# Patient Record
Sex: Male | Born: 1985 | Race: White | Hispanic: No | Marital: Single | Smoking: Current every day smoker
Health system: Southern US, Community
[De-identification: ages and names within clinical notes are randomized; demographics above are authoritative.]

## PROBLEM LIST (undated history)

## (undated) DIAGNOSIS — B1920 Unspecified viral hepatitis C without hepatic coma: Secondary | ICD-10-CM

## (undated) DIAGNOSIS — T1491XA Suicide attempt, initial encounter: Secondary | ICD-10-CM

## (undated) DIAGNOSIS — F329 Major depressive disorder, single episode, unspecified: Secondary | ICD-10-CM

## (undated) DIAGNOSIS — F32A Depression, unspecified: Secondary | ICD-10-CM

## (undated) HISTORY — PX: MANDIBLE FRACTURE SURGERY: SHX706

---
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
## 2007-02-06 NOTE — Unmapped (Signed)
THE [facility]     PATIENT NAME:   Alexander [name], Alexander [name]                    MR #:  [id number]   DATE OF BIRTH:  1986/01/20                        ACCOUNT #:  [id number]   ED PHYSICIAN:   Frederik Schmidt, M.D.       ROOM #:   PRIMARY:        Bartolo Darter, M.D.            NURSING UNIT:  ED   REFERRING:      Selected Referral Pt              FC:  S   DICTATED BY:    Glenetta Borg, M.D.               ADMIT DATE:  02/05/2007   VISIT DATE:     02/05/2007                        DISCHARGE DATE:                           EMERGENCY DEPARTMENT DISCHARGE NOTE     CHIEF COMPLAINT:  Abdominal pain and blood in my stool.     HISTORY OF PRESENT ILLNESS:  This is an 21 year old, Caucasian gentleman who   states that yesterday he started noticing that he was having some abdominal   pain located in his upper abdomen region.  States that today it got acutely   worse.  He was driving back to his house where he lives with his girlfriend   and the pain became so severe that she had to help him out of the car and   actually call a life squad because he felt like the pain was going to cause   him to pass out.  The patient states that this pain seems to come and go.  It   worsens when he moves around or takes deep breaths.  He states that he has   also noted that for the past couple days, he has had some blood in his stool.   He has noticed some red blood in the toilet bowl, as well as blood on his   toilet tissue.  Denies any chest pain or shortness of breath.  He states that   he does have some nausea.  He has not had any vomiting.  He has not had any   lightheadedness or dizziness.  States that he has had a hard time eating over   the past couple of days because of this abdominal pain, although the pain   does not seem to change when he eats.  He denies any other complaints at this   time.     PAST MEDICAL HISTORY:     1. Psychiatric condition.     MEDICATIONS:     1. Xanax.   2. Seroquel.      ALLERGIES:  He states that he has no allergies.     SOCIAL HISTORY:  He smokes a pack and a half per day.  He states that he does   not use any alcohol or illicit drugs.     REVIEW OF SYSTEMS:  Negative other than that stated in the HPI.  PHYSICAL EXAMINATION:     VITAL SIGNS:  BP 144/90, pulse 90, respirations 16, temperature 97.6, satting   98% on room air.   GENERAL:  This is a well-developed, well-appearing, Caucasian gentleman who   is lying down on his left side in bed, curled in the fetal position.  He is   moaning at this time.  He appears in mild distress.   HEENT:  Head is atraumatic, normocephalic.  Pupils are equal, round and   reactive to light.  Extraocular movements are intact.  He has no nasal   drainage.  Mucous membranes are moist.   NECK:  Without any lymphadenopathy.  He has full range of motion.   CARDIOVASCULAR:  Regular rate and rhythm, no murmurs, rubs or gallops.   RESPIRATORY:  Clear to auscultation bilaterally.   ABDOMEN:  Soft, nondistended.  He does have tenderness in his left upper   quadrant.  He has no rebound, no guarding.  The patient does have bowel   sounds noted in all four quadrants that did not appear to be abnormal.  I am   able to press my stethoscope quite hard into his belly without eliciting any   pain.   SKIN:  Warm and well-perfused.   MUSCULOSKELETAL:  He has full range of motion about all joints.   NEUROLOGIC:  He is alert and oriented to person, place, time and situation.   He is answering questions appropriately.   RECTAL:  The patient has normal rectal tone.  He has no gross blood.  He has   no anal fissures or ulcerations noted.  He has no external hemorrhoids noted.   No internal hemorrhoids are palpable.     EMERGENCY DEPARTMENT COURSE:  This patient was seen and evaluated by myself   and the attending physician.  He did have an IV started and had multiple labs   drawn and sent.  A guaiac was performed on his stool, which was negative.  He   did originally  have a liter of normal saline, as well as some morphine for   his pain.  He states that he was continuing to have excessive pain.  At that   time, he was given Percocet and it was determined that he could be discharged   home in good condition.     LABORATORY DATA:  The patient had a CBC with a white count of 6.5, hemoglobin   15, hematocrit 43.1, platelets of 285.  He had a renal with a sodium 139,   potassium 4.3, chloride 103, CO2 26, BUN 13, creatinine 1.05 with a glucose   of 99.  He had an AST of 20, ALT 15, direct bilirubin 0.1, total bilirubin   0.2.  Lipase of 16, alkaline phosphatase of 99.  He had a urine that showed   less than 1 WBC, less than 1 RBC.  Stool was guaiac negative.     MEDICAL DECISION MAKING:  It is unclear what is causing this patient's   abdominal pain at this time.  I do not believe he has an acute abdomen, as he   has no significant tenderness when I press on it with the stethoscope while I   am listening.  He has no nausea or vomiting at this time to suggest he has   obstruction.  Also, his abdomen film did not show any signs of obstruction or   any signs of free air underneath his diaphragm.  I do not feel like  this   patient is having an acute UTI, as he has no evidence of such on his urine.   I do not feel like this patient is having an acute episode of pancreatitis or   any signs of hepatitis, as none of his labs show evidence of damage to his   pancreas or liver.  It is possible that this patient may have an ulcer in his   abdomen causing his pain.  I do not feel like he is actively hemorrhaging at   this time.  His stool was guaiac negative and he has no active bleeding from   his rectum at this time.  He has not had any vomiting with blood in it to   suggest ulceration at this time.  I do believe the patient has a nonacute   abdomen and is stable for discharge.  His vital signs all did remain stable   throughout his course here.     DIAGNOSIS:     1. Abdominal pain, NOS.      DISPOSITION:  Home.     CONDITION:  Good.     PLAN:     1. The patient is instructed take Percocet as needed for pain.   2. 2.  He is instructed to establish a primary care physician.   3. He is instructed to return for worsened pain, fevers, vomiting or any     other concerns.     ADDITIONAL DATA:  The patient was in the process of being discharged.  When I   did go in to talk to the patient, he got very distraught and states that I   was calling him a liar.  He states that he does not understand why I did so   many labs and tests if I did not think he was having this pain for real   anyway.  The patient became quite agitated and was demanding to leave.  When   I informed him that he needed to wait just a moment for his nurse, he started   banging his self against the wall and was becoming very loud and agitated.   At this time, the nurse did presents to the room to help calm the patient   down.  The patient again stated that he felt like I had called him a liar and   he was very unhappy with the care he received.  He was very distraught over   the fact that he had a rectal exam and was very hurt that we did not believe   him.  It was again explained to the patient that we had run multiple labs and   tests to search for a cause for his abdominal pain as we did think that this   was real and we did not find anything acutely to keep him here.  The patient   then did calm down in the presence of his father and the nurse and left   without any further difficulty.                                                       _______________________________________   CE/nc  _____   D:  02/05/2007 23:47                  Glenetta Borg, M.D.   T:  02/06/2007 14:40   Job #:  [id number]                                         _______________________________________                                         _____                                         Frederik Schmidt, M.D.                              EMERGENCY DEPARTMENT DISCHARGE NOTE                                        COPY                    PAGE    1 of   1                                         Frederik Schmidt, M.D.                             EMERGENCY DEPARTMENT DISCHARGE NOTE                                        COPY                    PAGE    1 of   1

## 2007-03-25 ENCOUNTER — Inpatient Hospital Stay

## 2007-03-25 NOTE — Unmapped (Signed)
THE [facility]     PATIENT NAME:   Alexander [name], Alexander [name]                    MR #:  [id number]   DATE OF BIRTH:  [birth year]                        ACCOUNT #:  [id number]   ED PHYSICIAN:   Tawni Pummel. Sena Slate, M.D.         ROOM #:   PRIMARY:        Referring Nonstaff                NURSING UNIT:  ED   REFERRING:      Selected Referral Pt              FC:  S   DICTATED BY:    Tawni Pummel. Sena Slate, M.D.         ADMIT DATE:  03/25/2007   VISIT DATE:                                       DISCHARGE DATE:                       EMERGENCY DEPARTMENT 24 HOUR FOLLOWUP NOTE     **ADDENDUM: 03/24/07 - MR     [name] is a gentleman seen by Dr. Roney Mans yesterday, and I staffed the case.   Essentially he presented with several small fragments of glass in his right   eye which we had some difficulty removing with a TB syringe.  We used a   Morgan lens to try and wash them out, and the majority of the fragments were   able to be removed, but there was at least one small fragment that we simply   were unable to remove and we set up follow-up at 10:00 this [facility].  The patient states that he was there and waited in no   one came.  I called the ophthalmologist, and the ophthalmologist says he was   there and waited and no one came so ultimately they must have been at   differing locations.     Medical screening examination was performed.     VITAL SIGNS:  Blood pressure 120/77, pulse 63, respiratory rate 16,   temperature 97.0, pulse ox 100% room air.   GENERAL:  The patient was smiling, in no distress, although somewhat   frustrated with the situation.  He was ambulatory without any difficulty.   HEENT:  He still had a red eye.  I did not do a detailed eye exam as the   patient was able to be dispositioned directly to ophthalmology's care.     EMERGENCY DEPARTMENT COURSE:  The patient stated he had no other complaints   today.  I called and discussed the case with the ophthalmologist  who stated   that he was still in clinic and that we should send the patient right over   and he would be happy to see him and take care of him there and the patient   was walked over to [facility] to make sure that he met up with the   ophthalmologist.     IMPRESSION:     1. Foreign body sensation, right eye and eye pain  in the right eye, not     otherwise specified.     PLAN:     1. Go straight to [facility] and follow the directions of the     ophthalmologist.                                                     _______________________________________   [facility]/mr                                _____   D:  03/25/2007 13:20                  Tawni Pummel. Sena Slate, M.D.   T:  03/25/2007 19:27   Job #:  [id number]                           EMERGENCY DEPARTMENT 24 HOUR FOLLOWUP NOTE                                        COPY                    PAGE    1 of   1   D:  03/25/2007 13:20                  Tawni Pummel. Sena Slate, M.D.   T:  03/25/2007 19:27   Job #:  [id number]                           EMERGENCY DEPARTMENT 24 HOUR FOLLOWUP NOTE                                        COPY                    PAGE    1 of   1

## 2007-03-26 NOTE — Unmapped (Signed)
THE [facility]     PATIENT NAME:   Alexander [name], Alexander [name]                    MR #:  [id number]   DATE OF BIRTH:  [birth year]                        ACCOUNT #:  [id number]   ED PHYSICIAN:   Tawni Pummel. Sena Slate, M.D.         ROOM #:   PRIMARY:        Referring Nonstaff                NURSING UNIT:  ED   REFERRING:      Selected Referral Pt              FC:  S   DICTATED BY:    Elwyn Reach, M.D.                 ADMIT DATE:  03/24/2007   VISIT DATE:     03/24/2007                        DISCHARGE DATE:                           EMERGENCY DEPARTMENT DISCHARGE NOTE     CHIEF COMPLAINT:  Right eye pain.     HISTORY OF PRESENT ILLNESS:  The patient is an 21 year old gentleman who   states that when he woke up earlier in the morning he had some right eye   pain.  He also states that he had some itchiness and some tearing of the eye.   The patient states that his pain is 8/10, located in his right eye, provoked   by sunlight, relieved by running water through it, radiates to his head, it   is a sharp pressure-like sensation that is constant.  The patient denies any   injury to it and does not recall any true insertion of a foreign body but   does state that his brother had broken a picture frame on the couch, and once   the glass was cleaned up the patient did fall asleep on the couch and   believes that there is a possibility that there might have been a piece of   glass that may have gone into his eye.  He does were contact lenses, but he   is currently taking none at this time.     PAST MEDICAL HISTORY:     1. Bipolar disorder.     MEDICATIONS:     1. Xanax.   2. Seroquel.     ALLERGIES:  No known drug allergies.     SOCIAL HISTORY:  The patient smokes a pack a day.  Denies any alcohol or drug   use.     REVIEW OF SYSTEMS:  All systems were reviewed and were negative except for   mentioned above in history of present illness.     PHYSICAL EXAMINATION:     VITAL SIGNS:  Blood pressure is 118/62,  pulse 63, respirations 18,   temperature 97.2 and O2 saturation 98% on room air.   GENERAL:  The patient is alert and oriented x 3, in no apparent distress.   HEENT:  Head is normocephalic and atraumatic.  Eyes:  Pupils are equal and   reactive to light  bilaterally with extraocular movements intact.  The   patient's right eye had scleral injections noted throughout with fluoroscein   exam there is mild amount of uptake at approximately 7 o'clock just overlying   his iris.  The patient had a slit lamp examination an area that seemed to   have reflected light that seemed to resemble glass that measured   approximately 0.5 mm x 0.5 mm and seemed to be in bed with the corneas.  It   is to be staying in place with blinking of the eye.  There did not seemed to   be any associated abrasions of the eye itself.   NECK:  Supple.  Full range of motion.  No JVD and no cervical   lymphadenopathy.   RESPIRATORY:  Clear to auscultation bilaterally.  No wheezes, rales or   rhonchi.   CARDIOVASCULAR:  Regular rate and rhythm.  No murmurs, rubs or gallops.   Normal S1, S2.   ABDOMEN:  Soft, nontender, nondistended, positive bowel sounds.  No signs of   hepatosplenomegaly.   SKIN:  No lesions were noted throughout.   PSYCHIATRIC:  Normal mood and appropriate affect.     EMERGENCY DEPARTMENT COURSE:  The patient was triaged to minor care, assessed   by myself and the attending physician.  Labs and imaging studies were   performed.  We did try to attempt to remove the foreign body using a small   needle syringe and was able to do so and in addition to that patient had a   Morgan lens placed in his right eye and 1 L normal saline was flushed through   that.  On repeat examination with the slit lamp, the patient seemed to have   had his foreign body removed, but there was an area of corneal abrasion that   seemed to measure approximately 1-2 mm x 2 mm and again located approximately   7 o'clock.     MEDICAL DECISION MAKING:  The patient  is an 21 year old gentleman with a   foreign body that was in his right eye, it seemed to be small shard of glass.   We believe that has been removed after irrigation with normal saline via the   [facility] lens to his right eye.  The patient does report a mild amount of   increased pain but again does not appear that the glass itself is still in   bed with his cornea.  We did speak with the ophthalmology resident on call   and we did not feel the patient required urgent consult, but the patient was   given a referral to be seen in the outpatient clinic at [facility] tomorrow on   Sunday at 10 a.m.  The resident on-call states that he will be able to meet   with the patient tomorrow in their clinic despite being Sunday in order to   ensure that the foreign body itself was removed.     DIAGNOSIS:     1. Right eye corneal abrasion with foreign body that has been removed.     CONDITION:  Good.     DISPOSITION:  Home.     PLAN:     1. The patient was given Polysporin ophthalmic appointment to be placed in     his eye approximately 0.5-inch, ________ was placed on his right eye prior     to discharge and he was instructed to use it 4 times a day.   2. He  is to follow at [facility] in the ground floor     tomorrow at 10 a.m. for further evaluation.  Otherwise, he is asked to     return to the Emergency Department he develops any worsening signs or     symptoms.                                                         ______________________________________   AY/tm                                 ______   D:  03/24/2007 21:42                  Elwyn Reach, M.D.   T:  03/26/2007 00:24   Job #:  [id number]                                         ______________________________________                                         ______                                         Tawni Pummel. Sena Slate, M.D.                             EMERGENCY DEPARTMENT DISCHARGE NOTE                                        COPY                     PAGE    1 of   1

## 2007-03-28 ENCOUNTER — Inpatient Hospital Stay

## 2008-07-03 ENCOUNTER — Inpatient Hospital Stay

## 2008-07-03 NOTE — Unmapped (Signed)
Signed by   LinkLogic on 07/03/2008 at 16:43:36  Patient: Alexander [name]  Note: All result statuses are Final unless otherwise noted.    Tests: (1)  (MR)    Order Note:                                     THE [facility]     PATIENT NAME:   ESCHER, HARR                    MR #:  [id number]  DATE OF BIRTH:  [birth year]                        ACCOUNT #:  [id number]  ED PHYSICIAN:   Leighton Ruff, M.D.               ROOM #:  PRIMARY:        No Pcp No Pcp                     NURSING UNIT:  ED  REFERRING:      No Pcp No Pcp                     FC:  S  DICTATED BY:    Cyndra Numbers, M.D.                 ADMIT DATE:  07/03/2008  VISIT DATE:     07/03/2008                        DISCHARGE DATE:                           EMERGENCY DEPARTMENT ADMISSION NOTE        CHIEF COMPLAINT:  Right eye pain.     HISTORY OF PRESENT ILLNESS:  The patient is a 22 year old male who was  sharpening a blade on a lawnmower today wearing his glasses, when he all of  sudden had pain in his right eye.  He said he flushed it out with water and  no longer feels like there is anything in his eye since doing this.  However,  he has noticed there has been some injection in the sclera surrounding this  and also some swelling around his eye.  The patient stated he noticed that he  has a small black speck in his eye when he looks closely in the mirror at the  iris at about 7 o'clock.  The patient had to have glass removed from this eye  previously in a prior accident.  His past medical history is significant only  for bipolar disorder.  He denies any loss of consciousness with the accident.  He has good maintenance of pupillary shape and no evidence of ruptured globe.     ALLERGIES:  None.     MEDICATIONS:  None.     PAST MEDICAL HISTORY:     1.  Bipolar disorder.     FAMILY HISTORY:  Noncontributory.     SOCIAL HISTORY:  The patient does not have a primary care doctor.     REVIEW OF SYSTEMS:  Negative except as mentioned previously in HPI.     PHYSICAL  EXAMINATION:     VITAL SIGNS:  BP 144/89, pulse 84, respirations  20, temperature 97.6 orally,  O2 sat 100% on room air.  GENERAL:  The patient is in no acute distress.  HEENT:  Eyes:  The patient has scleral injection of the right eye.  There is  some minimal swelling surrounding the eye.  There is no afferent pupillary  defect on swinging light exam.  The patient denies pain on illumination of  the left eye.  The right eye does demonstrate a small fleck of metal with  associated rust ring near the edge of the right iris at the position of 7  o'clock.  The slit lamp malfunctioned during slit lamp exam.  We were able to  have fluorosceined the patient and were not able to see any corneal abrasions  on this evaluation prior to the bulb dying.  The patient on funduscopic exam  had no obvious abnormalities.  Head is otherwise atraumatic, normocephalic.  No gross otorrhea or rhinorrhea.  CARDIOVASCULAR:  Regular rate and rhythm.  No murmurs, rubs or gallops.  No  peripheral edema.  RESPIRATORY:  Clear to auscultation bilaterally.  No crackles, wheezes or  rales.  GI/ABDOMEN:  Normal bowel sounds.  Belly is soft, nontender.  No guarding or  rigidity suggestive of a surgical abdomen.  MUSCULOSKELETAL:  The patient moves all four extremities symmetrically.  No  evidence of paralysis.  SKIN:  No rash, petechia, purpura or jaundice.  NEUROLOGIC:  Cranial nerves II-XII grossly intact.   No gross abnormalities  to strength or sensation.  Gait is within normal limits.  PSYCHIATRIC:  Alert and oriented x 4.     LABORATORY RESULTS/RADIOLOGY:  None.     EMERGENCY DEPARTMENT COURSE/MEDICAL DECISION-MAKING:  The patient was  admitted and examined by myself, as well as the attending, Dr. Crist Fat.  Based  on interview and exam, we attempted eye exam after giving patient tetracaine  drops with ________ eye stain.  Unfortunately, our slit lamp malfunctioned  just as we were attempting to remove the foreign body from the patient's  eye.  Given that we did not have this available to [place], we contacted [facility], gave them the patient's insurance status and need to be seen  today to have this dealt with.  The patient was able to travel quickly over  to [facility] and was therefore sent directly to clinic.  He will be seen there for  foreign body removal and further follow-up.  He did receive 800 mg of Motrin  po here in the emergency department given that he was complaining of headache  from this.     ASSESSMENT:  The patient is a 22 year old gentleman with foreign body in  right eye.     PLAN:     1.  Discharge, personal vehicle for immediate transfer to [facility] to be seen in  clinic.                                                    _______________________________________  MH/lzm                                 _____  D:  07/03/2008 15:14                  Cyndra Numbers, M.D.  T:  07/03/2008 16:38  Job #:  [id number]                                        _______________________________________                                         _____                                        Leighton Ruff, M.D.                              EMERGENCY DEPARTMENT ADMISSION NOTE                                                               PAGE    1 of   1    Note: An exclamation mark (!) indicates a result that was not dispersed into   the flowsheet.  Document Creation Date: 07/03/2008 4:43 PM  _______________________________________________________________________    (1) Order result status: Final  Collection or observation date-time: 07/03/2008 00:00  Requested date-time:   Receipt date-time:   Reported date-time:   Referring Physician: No Pcp  Ordering Physician:  Reviewed In Hospital [facility])  Specimen Source:   Source: DBS  Filler Order Number: [id number]  Lab site:

## 2008-07-03 NOTE — Unmapped (Signed)
Signed by Stoney Bang MD PhD on 07/03/2008 at 16:23:06    Demographic Update    Current registration information has been reviewed and is correct.  No changes are needed.        Reason for Visit   Chief Complaint: [facility] F[facility]  SHAPPENING BLADE ON A LAWN MOWER TODAY AND GOT SOMETHING IN MY RIGHT  C/O NAUSEA DUE PAIN OD 10/10 GIVEN IBUPROFEN BY ER  HX GLASS IN OD 1.29YR AGO  NO SX OU  History from: patient    Allergies  No Known Allergies    Medications     Pain:     Have you had pain other than everyday aches and pains (e.g., mild headache, back ache, strains) in the past week?   Yes  Location of pain: OD  Pain Score: 10/10  If so, did you take medication for this pain?   Yes  What medication?   IBUPROFEN  Did this pain interfere with your daily activities?   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 Went to [facility] - SL broken and sent here. + blurry, photophobia.    Review of Systems   Refer to HPI for review of systems documentation.    PHYSICAL EXAMINATION     Visual Exam   VA:  Right: 20/25  Left: 20/20     CC Pupil: 5-2 ou no rapd  TA Right: 14  TA Left: 14     Time: 4:18 PM  VF: CF Full OU  MOT: Full OU  MB: Orthophoric in primary      SLE (Slit Lamp Exam)   L/L: WNL OU  C/S: tr inj OD  w/q OS  K: tiny metal FB in ant stroma IN of visual axis s significant rust ring OD  c/c OS  AC: d/q OU  I: r/r OU  L: WNL OU  AV: quiet OU      Psychiatric: Alert and oriented x3.      Assessment and Plan  Status of Existing Problems:  Assessed FOREIGN BODY IN CORNEA as new - OD - removed at SL s significant rust ring. PSO ung three times a day x 1 week OD, f/u w/ [facility] 1 week. 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    Today's Orders   Complex Data Collection   (2 points) [IMS-11111]  Self Care Instructions/Simple  (1 point) [DUK-02542]  70623 - Ofc Visit New Level 4 [[id number]]  Ophth Other Preceptor  [phone]    Disposition:   Return to  clinic for Doctor Visit in 1 week(s)   Appointment Reason: w/ [facility] for DFE and MRx, K FB follow-up    *Patient Care Summary printed and given to patient.              Past Medical History:  No significant past medical history.           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

## 2008-07-03 NOTE — Unmapped (Signed)
Signed by   LinkLogic on 07/03/2008 at 14:40:34  Patient: Alexander [name]  Note: All result statuses are Final unless otherwise noted.    Tests: (1)  (MR)    Order Note:                                     THE [facility]     PATIENT NAME:   Alexander [name], Alexander [name]                    MR #:  [id number]  DATE OF BIRTH:  [birth year]                        ACCOUNT #:  [id number]  ED PHYSICIAN:   Leighton Ruff, M.D.               ROOM #:  PRIMARY:        No Pcp No Pcp                     NURSING UNIT:  ED  REFERRING:      No Pcp No Pcp                     FC:  S  DICTATED BY:    Leighton Ruff, M.D.               ADMIT DATE:  07/03/2008  VISIT DATE:     07/03/2008                        DISCHARGE DATE:                           EMERGENCY DEPARTMENT DISCHARGE NOTE        THIS IS AN ADDENDUM.   07/03/2008   CSD     I have seen and evaluated the patient and discussed her care with the  resident physician.  I agree with the assessment and plan.                                                       _______________________________________  MN/csd                                 _____  D:  07/03/2008 13:59                  Leighton Ruff, M.D.  T:  07/03/2008 14:24  Job #:  [id number]                                 EMERGENCY DEPARTMENT DISCHARGE NOTE                                                               PAGE    1 of  1    Note: An exclamation mark (!) indicates a result that was not dispersed into   the flowsheet.  Document Creation Date: 07/03/2008 2:40 PM  _______________________________________________________________________    (1) Order result status: Final  Collection or observation date-time: 07/03/2008 00:00  Requested date-time:   Receipt date-time:   Reported date-time:   Referring Physician: No Pcp  Ordering Physician:  Reviewed In Hospital [facility])  Specimen Source:   Source: DBS  Filler Order Number: [id number]  Lab site:

## 2008-07-10 ENCOUNTER — Inpatient Hospital Stay

## 2009-01-17 NOTE — Unmapped (Signed)
Signed by   LinkLogic on 01/18/2009 at 01:56:24  Patient: Alexander [name]  Note: All result statuses are Final unless otherwise noted.    Tests: (1)  (MR)    Order Note:                                   THE [facility]     PATIENT NAME:   [name], [name]                    MR #:  [id number]  DATE OF BIRTH:  Dec 18, 1985                        ACCOUNT #:  [id number]  ED PHYSICIAN:   Sanjuana Mae, M.D.             ROOM #:  PRIMARY:        No Pcp No Pcp                     NURSING UNIT:  ED  REFERRING:      Selected Referral Pt              FC:  S  DICTATED BY:    Sanjuana Mae, M.D.             ADMIT DATE:  01/17/2009  VISIT DATE:     01/17/2009                        DISCHARGE DATE:                           EMERGENCY DEPARTMENT DISCHARGE NOTE        This is an addendum 01/17/09 -smk     This is an addendum to the resident's dictation.  This patient was seen by  the resident.  I have seen and examined the patient; agree with the workup,  evaluation, management and diagnosis.  Care plan has been discussed.     RIGHT UPPER QUADRANT ULTRASOUND:  Indication was epigastric pain, abdominal  pain.  A limited, bedside ultrasound of the right upper quadrant was  performed.  The medical necessity was to evaluate for gallstones or  sonographic signs of cholecystitis.  The structures studied were the  gallbladder and liver.     I was present during the exam.     INTERPRETATION:  The patient has a normal gallbladder wall, normal  gallbladder size.  No wall thickening was noted.  There was no  pericholecystic fluid.  No stones were seen.  Negative sonographic Murphy's  sign.  Common bile duct was visualized and felt to be normal.                                                    _______________________________________  MW/smk                                 _____  D:  01/17/2009 20:47                  Sanjuana Mae, M.D.  T:  01/18/2009  01:41  Job #:  [id number]                                 EMERGENCY DEPARTMENT  DISCHARGE NOTE                                                               PAGE    1 of   1    Note: An exclamation mark (!) indicates a result that was not dispersed into   the flowsheet.  Document Creation Date: 01/18/2009 1:56 AM  _______________________________________________________________________    (1) Order result status: Final  Collection or observation date-time: 01/17/2009 00:00  Requested date-time:   Receipt date-time:   Reported date-time:   Referring Physician: Selected Pt  Ordering Physician:  Reviewed In Hospital [facility])  Specimen Source:   Source: DBS  Filler Order Number: [id number]  Lab site:

## 2009-01-17 NOTE — Unmapped (Signed)
Signed by   LinkLogic on 01/18/2009 at 18:44:21  Patient: Alexander [name]  Note: All result statuses are Final unless otherwise noted.    Tests: (1)  (MR)    Order Note:                                   THE [facility]     PATIENT NAME:   TIEGAN, TERPSTRA                    MR #:  [id number]  DATE OF BIRTH:  [birth year]                        ACCOUNT #:  [id number]  ED PHYSICIAN:   Sanjuana Mae, M.D.             ROOM #:  PRIMARY:        No Pcp No Pcp                     NURSING UNIT:  ED  REFERRING:      Selected Referral Pt              FC:  S  DICTATED BY:    Lindell Spar, M.D.             ADMIT DATE:  01/17/2009  VISIT DATE:     01/17/2009                        DISCHARGE DATE:                           EMERGENCY DEPARTMENT DISCHARGE NOTE        CHIEF COMPLAINT:  Abdominal pain.     HISTORY OF PRESENT ILLNESS:  This is a 23 year old who complains of  intermittent pain in his right side on and off for a year.  He says today the  pain became more severe.  He describes it as sharp, it comes and goes.  The  pain became severe last night.  He says that occasionally it radiates to his  lower back.  It is worse with walking.  He also notes some pressure when he  urinates.  Complains of legs feeling light and funny when he walks around.  He also notes a rash on his abdomen that has been present for about a week.  He first noticed some spots that looked like bug bites and then had some  surrounding redness.  Not having itching or pain associated with this.  None  of the rashes are draining any fluids.     REVIEW OF SYSTEMS:  He denies nausea, vomiting, diarrhea, fever, bloody  urine, although occasionally urine brown color.  Denies any weakness or  numbness of arms or legs.  All other systems negative as reviewed.     PRIMARY CARE PHYSICIAN:  The patient does not have a primary physician.     PAST MEDICAL HISTORY:     1.  Bipolar disorder.  2.  Anxiety.     ALLERGIES:  No allergies.     MEDICATIONS:  Does not  take any regular medications.     SOCIAL HISTORY:  He smokes two packs a day, occasionally drinks alcohol.  He  used some friend's Vicodin for the pain.  Also tried  Rolaids with no relief.     PHYSICAL EXAMINATION:     VITAL SIGNS:  Blood pressure 166/82, pulse 97, respirations 16, temperature  98.3, sat 100%.  GENERAL:  In general, he is a 23 year old in no acute distress.  HEENT:  Eyes:  Pupils equal, round and reactive to light.  Extraocular  movements intact.  NECK:  Supple, full range of motion.  LUNGS:  Respirations were regular.  Lungs clear to auscultation.  HEART:  Regular rate and rhythm without murmur.  LUNGS:  Clear to auscultation bilaterally.  ABDOMEN:  Soft, nontender with no rebound.  Normoactive bowel sounds.  He has  mild soreness with palpation of his right flank.  No CVA tenderness.  EXTREMITIES:  Has full range of motion of his arms and legs without pain.  NEUROLOGIC:  He was alert and oriented x 3.  Cranial nerves II through XII  intact.  Normal gait.  Intact strength and sensation in his legs.  SKIN:  He was noted to have three 1 cm erythematous papules on his left lower  abdomen and one in his right lower abdomen close to his groin with some  passage of small erythematous papules nontender, not itchy.  On palpation no  discharge from the lesions.  EMERGENCY DEPARTMENT COURSE:  Patient was seen  in room 26.  After history and physical, obtained laboratory studies.     LABORATORY DATA:  Urinalysis was normal.  Total CPK was 183.  Renal panel,  CBC, LFTs, lipase were all within normal limits.     We have performed a right upper quadrant ultrasound which was negative for  stones and sonographic Murphy's, no pericholecystic fluid.     The patient was given Toradol 30 mg IV which he states improved his pain.  I  discussed with him the importance of following up with primary care physician  for this chronic complaint.     DIAGNOSIS(ES):     1. Abdominal pain.  2. Rash, concerning for bed bugs.      DISPOSITION:  Home in stable condition.     PLAN:     1. Follow up with primary care physician.  2. Take Zantac 150 mg twice daily.  3. Return for worsening symptoms.  4. He was given CPQE abdominal pain instructions.  5. He was to wash all bedding and to follow up with his physician of he sees    any bugs.  qa/jlm                                                    _______________________________________  CHC/sar                                _____  D:  01/17/2009 22:27                  Lindell Spar, M.D.  T:  01/18/2009 08:06  Job #:  [id number]                                        _______________________________________  _____                                        Sanjuana Mae, M.D.                              EMERGENCY DEPARTMENT DISCHARGE NOTE                                                               PAGE    1 of   1    Note: An exclamation mark (!) indicates a result that was not dispersed into   the flowsheet.  Document Creation Date: 01/18/2009 6:44 PM  _______________________________________________________________________    (1) Order result status: Final  Collection or observation date-time: 01/17/2009 00:00  Requested date-time:   Receipt date-time:   Reported date-time:   Referring Physician: Selected Pt  Ordering Physician:  Reviewed In Hospital [facility])  Specimen Source:   Source: DBS  Filler Order Number: [id number]  Lab site:

## 2009-01-18 NOTE — Unmapped (Signed)
THE [facility]     PATIENT NAME:   Alexander [name], Alexander [name]                    MR #:  [id number]   DATE OF BIRTH:  Dec 22, 1985                        ACCOUNT #:  [id number]   ED PHYSICIAN:   Sanjuana Mae, M.D.             ROOM #:   PRIMARY:        No Pcp No Pcp                     NURSING UNIT:  ED   REFERRING:      Selected Referral Pt              FC:  S   DICTATED BY:    Lindell Spar, M.D.             ADMIT DATE:  01/17/2009   VISIT DATE:     01/17/2009                        DISCHARGE DATE:                           EMERGENCY DEPARTMENT DISCHARGE NOTE       CHIEF COMPLAINT:  Abdominal pain.     HISTORY OF PRESENT ILLNESS:  This is a 23 year old who complains of   intermittent pain in his right side on and off for a year.  He says today the   pain became more severe.  He describes it as sharp, it comes and goes.  The   pain became severe last night.  He says that occasionally it radiates to his   lower back.  It is worse with walking.  He also notes some pressure when he   urinates.  Complains of legs feeling light and funny when he walks around.   He also notes a rash on his abdomen that has been present for about a week.   He first noticed some spots that looked like bug bites and then had some   surrounding redness.  Not having itching or pain associated with this.  None   of the rashes are draining any fluids.     REVIEW OF SYSTEMS:  He denies nausea, vomiting, diarrhea, fever, bloody   urine, although occasionally urine brown color.  Denies any weakness or   numbness of arms or legs.  All other systems negative as reviewed.     PRIMARY CARE PHYSICIAN:  The patient does not have a primary physician.     PAST MEDICAL HISTORY:     1.  Bipolar disorder.   2.  Anxiety.     ALLERGIES:  No allergies.     MEDICATIONS:  Does not take any regular medications.     SOCIAL HISTORY:  He smokes two packs a day, occasionally drinks alcohol.  He   used some friend's Vicodin for the pain.  Also  tried Rolaids with no relief.     PHYSICAL EXAMINATION:     VITAL SIGNS:  Blood pressure 166/82, pulse 97, respirations 16, temperature   98.3, sat 100%.   GENERAL:  In general, he is a 23 year old in no acute distress.   HEENT:  Eyes:  Pupils  equal, round and reactive to light.  Extraocular   movements intact.   NECK:  Supple, full range of motion.   LUNGS:  Respirations were regular.  Lungs clear to auscultation.   HEART:  Regular rate and rhythm without murmur.   LUNGS:  Clear to auscultation bilaterally.   ABDOMEN:  Soft, nontender with no rebound.  Normoactive bowel sounds.  He has   mild soreness with palpation of his right flank.  No CVA tenderness.   EXTREMITIES:  Has full range of motion of his arms and legs without pain.   NEUROLOGIC:  He was alert and oriented x 3.  Cranial nerves II through XII   intact.  Normal gait.  Intact strength and sensation in his legs.   SKIN:  He was noted to have three 1 cm erythematous papules on his left lower   abdomen and one in his right lower abdomen close to his groin with some   passage of small erythematous papules nontender, not itchy.  On palpation no   discharge from the lesions.  EMERGENCY DEPARTMENT COURSE:  Patient was seen   in room 26.  After history and physical, obtained laboratory studies.     LABORATORY DATA:  Urinalysis was normal.  Total CPK was 183.  Renal panel,   CBC, LFTs, lipase were all within normal limits.     We have performed a right upper quadrant ultrasound which was negative for   stones and sonographic Murphy's, no pericholecystic fluid.     The patient was given Toradol 30 mg IV which he states improved his pain.  I   discussed with him the importance of following up with primary care physician   for this chronic complaint.     DIAGNOSIS(ES):     1. Abdominal pain.   2. Rash, concerning for bed bugs.     DISPOSITION:  Home in stable condition.     PLAN:     1. Follow up with primary care physician.   2. Take Zantac 150 mg twice daily.   3.  Return for worsening symptoms.   4. He was given CPQE abdominal pain instructions.   5. He was to wash all bedding and to follow up with his physician of he sees     any bugs.   qa/jlm                                                 _______________________________________   CHC/sar                                _____   D:  01/17/2009 22:27                  Lindell Spar, M.D.   T:  01/18/2009 08:06   Job #:  [id number]                                         _______________________________________  _____                                         Sanjuana Mae, M.D.                             EMERGENCY DEPARTMENT DISCHARGE NOTE                                                                PAGE    1 of   1                                          _____                                         Sanjuana Mae, M.D.                             EMERGENCY DEPARTMENT DISCHARGE NOTE                                                                PAGE    1 of   1

## 2009-01-18 NOTE — Unmapped (Signed)
THE [facility]     PATIENT NAME:   Alexander [name], Alexander [name]                    MR #:  [id number]   DATE OF BIRTH:  1986/09/27                        ACCOUNT #:  [id number]   ED PHYSICIAN:   Sanjuana Mae, M.D.             ROOM #:   PRIMARY:        No Pcp No Pcp                     NURSING UNIT:  ED   REFERRING:      Selected Referral Pt              FC:  S   DICTATED BY:    Sanjuana Mae, M.D.             ADMIT DATE:  01/17/2009   VISIT DATE:     01/17/2009                        DISCHARGE DATE:                           EMERGENCY DEPARTMENT DISCHARGE NOTE       This is an addendum 01/17/09 -smk     This is an addendum to the resident's dictation.  This patient was seen by   the resident.  I have seen and examined the patient; agree with the workup,   evaluation, management and diagnosis.  Care plan has been discussed.     RIGHT UPPER QUADRANT ULTRASOUND:  Indication was epigastric pain, abdominal   pain.  A limited, bedside ultrasound of the right upper quadrant was   performed.  The medical necessity was to evaluate for gallstones or   sonographic signs of cholecystitis.  The structures studied were the   gallbladder and liver.     I was present during the exam.     INTERPRETATION:  The patient has a normal gallbladder wall, normal   gallbladder size.  No wall thickening was noted.  There was no   pericholecystic fluid.  No stones were seen.  Negative sonographic Murphy's   sign.  Common bile duct was visualized and felt to be normal.                                                 _______________________________________   MW/smk                                 _____   D:  01/17/2009 20:47                  Sanjuana Mae, M.D.   T:  01/18/2009 01:41   Job #:  [id number]                               EMERGENCY DEPARTMENT DISCHARGE NOTE  PAGE    1 of   1                                                                PAGE    1 of    1

## 2009-06-17 NOTE — Unmapped (Signed)
Signed by   LinkLogic on 06/18/2009 at 08:08:54  Patient: Alexander [name]  Note: All result statuses are Final unless otherwise noted.    Tests: (1)  (MR)    Order Note:                                   THE [facility]     PATIENT NAME:   Alexander [name], Alexander [name]                    MR #:  [id number]  DATE OF BIRTH:  Apr 28, 1986                        ACCOUNT #:  [id number]  ED PHYSICIAN:   Cliffton Asters, M.D.               ROOM #:  PRIMARY:        No Pcp No Pcp                     NURSING UNIT:  ED  REFERRING:      Selected Referral Pt              FC:  S  DICTATED BY:    Denton Ar, M.D.            ADMIT DATE:  06/17/2009  VISIT DATE:     06/17/2009                        DISCHARGE DATE:                           EMERGENCY DEPARTMENT ADMISSION NOTE     *-*-*     CHIEF COMPLAINT:  Suicide attempt.     HISTORY OF PRESENT ILLNESS:  The patient is a 23 year old Caucasian gentleman  with a history of multiple suicide attempts who has a history of cutting and  trying to drown himself.  He comes in today because the patient tried to hang  himself with a garden hose.  The patient reports that he was just getting so  fed up, had the garden hose around his neck.  He didn't even hang, he said he  was going to and his friend came and grabbed him.  The patient had no loss of  consciousness and he was brought directly here to the hospital to be further  evaluated.  He has no complaints at this time. He denies any neck pain.  Denies any difficulty swallowing or handling his secretions.  Denies any  difficulty breathing.  He says he has no changes in his voice.  He has no  other complaints, no other questions or concerns.  He denies any visual or  auditory hallucinations.  He said he just always has this feeling that he  wants to kill himself.     REVIEW OF SYSTEMS:  As stated in history of present illness, all others  systems reviewed and are negative.     PAST MEDICAL HISTORY:  As stated in the history of present illness as  well as:     1.  History of depression and anxiety.     ALLERGIES:  None.     MEDICATIONS:  He said he has been off his medications for the last year for  depression and  anxiety.     FAMILY HISTORY:  He has two older brothers that committed suicide.     SOCIAL HISTORY:  Smokes a pack a day.  Denies any alcohol or drug use.     PHYSICAL EXAMINATION:     VITAL SIGNS:  On ED admission, blood pressure 130/108, pulse 120, respiratory  rate 18, temperature 98.8, oxygen saturation 97% on room air, pain 0/10.  GENERAL:  The patient is a well-developed, well-nourished appearing Caucasian  gentleman seated comfortably on the stretcher.  He is awake, alert and  oriented to circumstances surrounding his ED presentation today.  HEENT:  Pupils equal, round and reactive to light and accommodation.  Extraocular muscles are intact.  Oropharynx is clear and moist without  petechiae, induration or exudate.  He has no subconjunctival hemorrhage or  chemosis on exam.  No petechia is evident.  NECK:  Soft, supple without lymphadenopathy.  He has pain free range of  motion.  There is no C-spine tenderness.  There is no paracervical muscular  tenderness to palpation.  There is no JVD or tracheal deviation.  He has no  evidence of abrasion, ecchymosis or lacerations surrounding his neck.  There  is no crepitus.  RESPIRATORY:  Clear to auscultation bilaterally. He has good equal bilateral  chest rise.  CARDIOVASCULAR:  Regular rate and rhythm without murmurs.  He has 2+ pulses  in the upper and lower extremities.  ABDOMEN:  Soft, nontender, nondistended with normoactive bowel sounds.  SKIN:  No cyanosis, clubbing or edema, petechiae, purpura or ecchymosis.  NEUROLOGIC:  Cranial nerves II through XII are intact.  There is no evidence  of nystagmus or visual field deficit.  Motor is 5/5 in lower extremities  proximally and distally bilaterally.  Sensory sensation is intact to lower  extremity proximally and distally bilaterally.  PSYCHIATRIC:   The patient appears to have appropriate mood and affect.     EMERGENCY DEPARTMENT COURSE:  The patient was admitted to the emergency  department for evaluation of the chief complaint as described in the history  of present illness.  Complete history and physical was performed.  The  patient was seen and evaluated by myself including my attending, Dr.  Orvan Falconer, following which he went to A pod.  History and physical was  obtained.     MEDICAL DECISION MAKING:  The patient presenting symptoms, physical exam and  diagnostic evaluation are most consistent with a suicide attempt.  The  patient had a psychiatric hold placed by the police. There is no significant  trauma noted to his neck and no symptoms at this time, therefore, at this  point in time we feel the patient can be safely transferred over to [facility] to be  further evaluated for his suicide attempt and continuous suicidal ideation.     IMPRESSION:     1.   Suicide attempt.     PLAN:     1. The patient will be transferred to [facility] for further evaluation and    management, doctor ______ has already been given.  The patient will be    transferred over there shortly for further evaluation and management.        *-*-*                                              _______________________________________  NMK/hj  _____  D:  06/18/2009 03:46                  Denton Ar, M.D.  T:  06/18/2009 04:55  Job #:  [id number]                                        _______________________________________                                         _____                                        Cliffton Asters, M.D.                              EMERGENCY DEPARTMENT ADMISSION NOTE                                                               PAGE    1 of   1    Note: An exclamation mark (!) indicates a result that was not dispersed into   the flowsheet.  Document Creation Date: 06/18/2009 8:08  AM  _______________________________________________________________________    (1) Order result status: Final  Collection or observation date-time: 06/17/2009 00:00  Requested date-time:   Receipt date-time:   Reported date-time:   Referring Physician: Selected Pt  Ordering Physician:  Reviewed In Hospital [facility])  Specimen Source:   Source: DBS  Filler Order Number: [id number]  Lab site:

## 2009-06-18 NOTE — Unmapped (Signed)
THE [facility]     PATIENT NAME:   Alexander [name], Alexander [name]                    MR #:  [id number]   DATE OF BIRTH:  [birth year]                        ACCOUNT #:  [id number]   ED PHYSICIAN:   Cliffton Asters, M.D.               ROOM #:   PRIMARY:        No Pcp No Pcp                     NURSING UNIT:  ED   REFERRING:      Selected Referral Pt              FC:  S   DICTATED BY:    Denton Ar, M.D.            ADMIT DATE:  06/17/2009   VISIT DATE:     06/17/2009                        DISCHARGE DATE:                           EMERGENCY DEPARTMENT ADMISSION NOTE     *-*-*     CHIEF COMPLAINT:  Suicide attempt.     HISTORY OF PRESENT ILLNESS:  The patient is a 23 year old Caucasian gentleman   with a history of multiple suicide attempts who has a history of cutting and   trying to drown himself.  He comes in today because the patient tried to hang   himself with a garden hose.  The patient reports that he was just getting so   fed up, had the garden hose around his neck.  He didn't even hang, he said he   was going to and his friend came and grabbed him.  The patient had no loss of   consciousness and he was brought directly here to the hospital to be further   evaluated.  He has no complaints at this time. He denies any neck pain.   Denies any difficulty swallowing or handling his secretions.  Denies any   difficulty breathing.  He says he has no changes in his voice.  He has no   other complaints, no other questions or concerns.  He denies any visual or   auditory hallucinations.  He said he just always has this feeling that he   wants to kill himself.     REVIEW OF SYSTEMS:  As stated in history of present illness, all others   systems reviewed and are negative.     PAST MEDICAL HISTORY:  As stated in the history of present illness as well   as:     1.  History of depression and anxiety.     ALLERGIES:  None.     MEDICATIONS:  He said he has been off his medications for the last year for   depression and anxiety.     FAMILY HISTORY:  He has two older brothers that committed suicide.     SOCIAL HISTORY:  Smokes a pack a day.  Denies any alcohol or drug use.     PHYSICAL EXAMINATION:     VITAL SIGNS:  On ED admission, blood pressure 130/108, pulse 120, respiratory   rate 18, temperature 98.8, oxygen saturation 97% on room air, pain 0/10.   GENERAL:  The patient is a well-developed, well-nourished appearing Caucasian   gentleman seated comfortably on the stretcher.  He is awake, alert and   oriented to circumstances surrounding his ED presentation today.   HEENT:  Pupils equal, round and reactive to light and accommodation.   Extraocular muscles are intact.  Oropharynx is clear and moist without   petechiae, induration or exudate.  He has no subconjunctival hemorrhage or   chemosis on exam.  No petechia is evident.   NECK:  Soft, supple without lymphadenopathy.  He has pain free range of   motion.  There is no C-spine tenderness.  There is no paracervical muscular   tenderness to palpation.  There is no JVD or tracheal deviation.  He has no   evidence of abrasion, ecchymosis or lacerations surrounding his neck.  There   is no crepitus.   RESPIRATORY:  Clear to auscultation bilaterally. He has good equal bilateral   chest rise.   CARDIOVASCULAR:  Regular rate and rhythm without murmurs.  He has 2+ pulses   in the upper and lower extremities.   ABDOMEN:  Soft, nontender, nondistended with normoactive bowel sounds.   SKIN:  No cyanosis, clubbing or edema, petechiae, purpura or ecchymosis.   NEUROLOGIC:  Cranial nerves II through XII are intact.  There is no evidence   of nystagmus or visual field deficit.  Motor is 5/5 in lower extremities   proximally and distally bilaterally.  Sensory sensation is intact to lower   extremity proximally and distally bilaterally.   PSYCHIATRIC:  The patient appears to have appropriate mood and affect.     EMERGENCY DEPARTMENT COURSE:  The patient was admitted to the  emergency   department for evaluation of the chief complaint as described in the history   of present illness.  Complete history and physical was performed.  The   patient was seen and evaluated by myself including my attending, Dr.   Orvan Falconer, following which he went to [facility].  History and physical was   obtained.     MEDICAL DECISION MAKING:  The patient presenting symptoms, physical exam and   diagnostic evaluation are most consistent with a suicide attempt.  The   patient had a psychiatric hold placed by the police. There is no significant   trauma noted to his neck and no symptoms at this time, therefore, at this   point in time we feel the patient can be safely transferred over to [facility] to be   further evaluated for his suicide attempt and continuous suicidal ideation.     IMPRESSION:     1.   Suicide attempt.     PLAN:     1. The patient will be transferred to [facility] for further evaluation and     management, doctor ______ has already been given.  The patient will be     transferred over there shortly for further evaluation and management.       *-*-*                                             _______________________________________   NMK/hj  _____   D:  06/18/2009 03:46                  Denton Ar, M.D.   T:  06/18/2009 04:55   Job #:  [id number]                                         _______________________________________                                          _____                                         Cliffton Asters, M.D.                             EMERGENCY DEPARTMENT ADMISSION NOTE                                                                PAGE    1 of   1                             EMERGENCY DEPARTMENT ADMISSION NOTE                                                                PAGE    1 of   1
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
## 2009-07-01 NOTE — Unmapped (Signed)
THE [facility]     PATIENT NAME:   Alexander [name], Alexander [name]                    MR #:  [id number]   DATE OF BIRTH:  1986-07-04                        ACCOUNT #:  [id number]   ED PHYSICIAN:   Cliffton Asters, M.D.               ROOM #:  [phone]   PRIMARY:        No Pcp No Pcp                     NURSING UNIT:  [id number]   REFERRING:      [name], [name]            FC:  S   DICTATED BY:    Cliffton Asters, M.D.               ADMIT DATE:  06/18/2009   VISIT DATE:     06/23/2009                        DISCHARGE DATE:  06/23/2009                           EMERGENCY DEPARTMENT DISCHARGE NOTE     *-*-*     ADDENDUM, 06/18/2009, RM     The patient seen and examined, discussed with emergency medicine resident,   Dr. Tally Joe.  I agree with the assessment and plan.       *-*-*                                             _______________________________________   JC/mm                                  _____   D:  07/01/2009 11:40                  Cliffton Asters, M.D.   T:  07/01/2009 17:39   Job #:  [id number]                               EMERGENCY DEPARTMENT DISCHARGE NOTE                                                                PAGE    1 of   1                                                                PAGE    1 of   1
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
## 2010-07-07 ENCOUNTER — Inpatient Hospital Stay
Admit: 2010-07-07 | Discharge: 2010-07-08 | Disposition: A | Discharge status: Home / Self Care | Attending: Emergency Medicine

## 2010-07-07 NOTE — ED Provider Notes (Signed)
I have personally performed and/or participated in the history, exam and medical decision making and agree with all pertinent clinical information.  I have also reviewed and agree with the past medical, family and social history unless otherwise noted.    Alyson Locket Jamarrion Budai, MD  07/07/10 2008

## 2010-07-07 NOTE — Discharge Instructions (Signed)
Take medicine as prescribed.  Follow up with your doctor or the referal doctor as instructed.  Return for increased pain, swelling, fever/chills or any other concerns. Begin warm moist compresses 4 to 5 times a day.        Abscess/Boil  (Furuncle)     An abscess (boil or furuncle) is an infected area that contains a collection of pus.    SYMPTOMS  Signs and symptoms of an abscess include pain, tenderness, redness, or hardness. You may feel a moveable soft area under your skin. An abscess can occur anywhere in the body.       TREATMENT  An incision (cut by the caregiver) may have been made over your abscess so the pus could be drained out. Gauze may have been packed into the space or a drain may have been looped thru the abscess cavity (pocket). This provides a drain that will allow the cavity to heal from the inside outwards. The abscess may be painful for a few days, but should feel much better if it was drained. Your abscess, if seen early, may not have localized and may not have been drained. If not, another appointment may be required if it does not get better on its own or with medications.     HOME CARE INSTRUCTIONS   Only take over-the-counter or prescription medicines for pain, discomfort, or fever as directed by your caregiver.          Keep the skin and clothes clean around your abscess.     If the abscess was drained, you will need to use gauze dressing ("4x4") to collect any draining pus.  These dressing typically will need to be changed 3 or more times during the day.   The infection may spread by skin contact with others.  Avoid skin contact as much as possible.   Good hygiene is very important including regular hand washing, cover any draining skin lesions, and don't share personal care items.     If you participate in sports do not share athletic equipment, towels, whirlpools, or personal care items.  Shower after every practice or tournament.     If a draining area cannot be adequately  covered:  l Do not participate in sports    l Children should not participate in day care until the wound has healed or drainage stops.     If your caregiver has given you a follow-up appointment, it is very important to keep that appointment. Not keeping the appointment could result in a much worse infection, chronic or permanent injury, pain, and disability. If there is any problem keeping the appointment, you must call back to this facility for assistance.       SEEK MEDICAL CARE IF:   You develop increased pain, swelling, redness, drainage, or bleeding in the wound site.   You develop signs of generalized infection including muscle aches, chills, fever, or a general ill feeling.   You or your child have an oral temperature above 102 F (38.9 C).   Your baby is older than 3 months with a rectal temperature of 100.5 F (38.1 C) or higher for more than 1 day.     See your caregiver as directed for a recheck or sooner if you develop any of the symptoms described above. Take antibiotics (medicine that kills germs) as directed if they were prescribed.     MAKE SURE YOU:     Understand these instructions.     Will watch your condition.  Will get help right away if you are not doing well or get worse.     Document Released: 08/31/2005  Document Re-Released: 09/18/2009  [facility] Patient Information 2011 [place].

## 2010-07-07 NOTE — ED Provider Notes (Signed)
HPI Comments: Patient presents complaining of abscess to the right knee for the past 3 days.  He states the area is swollen, red and painful.  He rates the pain as a 7 on a scale of 10.  He denies any fever or chills.  He denies any other complaints.  He does have a roommate who had an MRSA infection    The history is provided by the patient.         Review of Systems  Pertinent Review of systems negative except as noted in the HPI      Physical Exam   [nursing notereviewed.  Constitutional: He is cooperative.  Non-toxic appearance.   Musculoskeletal:        Right knee: He exhibits swelling and erythema. He exhibits normal range of motion, no effusion, no deformity and no bony tenderness.        Legs:       Pedal pulses are intact.  Good light touch discrimination in the foot/toes.  Able to move all joints distal to the injury with full ROM and good strength.  No tenderness distal to the injury.   Neurological: He is alert.   Skin: Skin is warm and dry.   Psychiatric: He has a normal mood and affect.         Incision/Drainage  Date/Time: 07/07/2010 8:25 PM  Performed by: Arline Asp  Authorized by: Kevan Rosebush  Type: abscess  Body area: lower extremity  Location details: right leg  Anesthesia: local infiltration  Local anesthetic: lidocaine 1% with epinephrine  Anesthetic total: 1 ml  Scalpel size: 15  Incision type: elliptical  Complexity: simple  Drainage: purulent  Drainage amount: moderate  Wound treatment: wound left open  Patient tolerance: Patient tolerated the procedure well with no immediate complications.          MDM      Labs      Radiology      EKG Interpretation     Arline Asp, [place]  07/08/10 0018    Genene Churn Ward, MD  07/09/10 1100

## 2010-07-08 MED ADMIN — cephALEXin (KEFLEX) capsule 500 mg: 500 mg | ORAL | @ 01:00:00 | NDC 00093314501

## 2010-07-08 MED ADMIN — sulfamethoxazole-trimethoprim (BACTRIM DS) 800-160 MG per tablet 1 tablet: 1 | ORAL | @ 01:00:00 | NDC 68084023011

## 2010-07-08 MED FILL — CEPHALEXIN 250 MG PO CAPS: 250 MG | ORAL | Qty: 2

## 2010-07-08 MED FILL — SULFAMETHOXAZOLE-TMP DS 800-160 MG PO TABS: 800-160 MG | ORAL | Qty: 1

## 2010-09-10 ENCOUNTER — Inpatient Hospital Stay: Payer: Self-pay | Admitting: Psychiatry

## 2010-10-10 ENCOUNTER — Inpatient Hospital Stay: Payer: Self-pay | Admitting: Psychiatry

## 2010-11-21 ENCOUNTER — Inpatient Hospital Stay: Payer: Self-pay | Admitting: Psychiatry

## 2010-12-26 ENCOUNTER — Inpatient Hospital Stay: Payer: Self-pay | Admitting: Psychiatry

## 2011-01-05 ENCOUNTER — Emergency Department: Payer: Self-pay | Admitting: Emergency Medicine

## 2011-03-05 ENCOUNTER — Inpatient Hospital Stay
Admit: 2011-03-05 | Discharge: 2011-03-06 | Disposition: A | Discharge status: Home / Self Care | Attending: Emergency Medicine

## 2011-03-05 MED ORDER — CEFTRIAXONE SODIUM 250 MG IJ SOLR
250 MG | Freq: Once | INTRAMUSCULAR | Status: AC
Start: 2011-03-05 — End: 2011-03-05
  Administered 2011-03-05: 23:00:00 via INTRAMUSCULAR

## 2011-03-05 MED FILL — CEFTRIAXONE SODIUM 250 MG IJ SOLR: 250 MG | INTRAMUSCULAR | Qty: 250

## 2011-03-05 NOTE — Discharge Instructions (Signed)
IMPORTANT:  If you have any trouble getting in to see the physician that we have referred you to today, please call the [facility] at [phone].  Please leave a voice message if they are unavailable and they will return your call.    If you were prescribed an outpatient test, please call [facility] at [phone] to schedule an appointment for your test that was ordered.         DIAGNOSIS:  The encounter diagnosis was Urethritis, unspecified.      ADDITIONAL INSTRUCTIONS FOR ALL PATIENTS:  -If you have been prescribed an antibiotic TAKE IT AS DIRECTED UNTIL IT IS ALL FINISHED.  -If you HAVE RECEIVED OR BEEN PRESCRIBED A MEDICATION THAT MAY CAUSE DROWSINESS. DO NOT DRIVE, DRINK ALCOHOL, OR OPERATE MACHINERY THAT REQUIRES YOU TO BE ALERT.    -If you had an EKG and/or X-Ray reading made in the Emergency Department, it will be reviewed by a Cardiologist and/or Radiologist. If the review changes your diagnosis, you will be contacted.  -If you had a specimen collected for culture, a CULTURE REPORT takes 48-72 hours to generate: You will be contacted if a change in treatment is needed.    -Return if your condition worsens or if you have severe pain, worsening of symptoms such as fever, vomiting or difficulty breathing.                    Sexually Transmitted Disease  Sexually transmitted disease (STD) refers to any infection that is passed from person to person during sexual activity. This may happen by way of saliva, semen, blood, vaginal mucus, or urine. Infections are passed in many ways. For example, the common cold could be passed during sexual activity, but it is not considered a sexually transmitted infection.  CAUSES  Sexual activity is the contact between the genitals of one partner and the genitals, anus, eyes, mouth, or throat of another. These activities include sexual intercourse, the sharing of sex toys, needles, or any other intimate contact of the genitals, mouth, or  rectal areas. An STD may be spread by bacteria (germ), virus, or parasite. These small germ-like agents can enter the body and infect the skin and linings of the:   Vagina.    Rectum.     Urethra.    Cervix.    Eyes.    Mouth.    Throat.     HIV/AIDS and hepatitis B infection can also be spread by needles, through the blood.  Common STDs include:   Gonorrhea.    Chlamydia.     Syphilis.     HIV/AIDS (human immunodeficiency virus / acquired immunodeficiency syndrome).     Genital herpes.    Hepatitis B.      Trichomonas.     Human papilloma virus (HPV).      Pubic lice.      SYMPTOMS  Some people may not have any symptoms, but can still transmit the infection to others.   Painful or bloody urination.    Pain in the pelvis, abdomen, vagina, anus, throat, or eyes. Where it hurts depends on the type of sexual contact.     Skin rash, itching, irritation, growths, or lesions (sores, ulcer). These usually occur in the genital or anal area. These growths may or may not be painful, irritated, or itch.    Abnormal vaginal discharge..    Fever.    Pain or bleeding during sexual intercourse.    Swollen glands in the  groin area.    Yellow skin and eyes, seen with hepatitis (jaundice).   RISK FACTORS     Having multiple sex partners.    Having a sex partner who has other sex partners.    Taking illegal drugs or drinking too much alcohol. This can affect your judgment and put you in a vulnerable position.    Having unprotected sex, not using condoms.    Having open sores on your skin or in your mouth, during sexual activity.    Being involved in high-risk sexual acts.    Engaging in oral or anal sex...   DIAGNOSIS   Diagnosis of sexually transmitted infections begins with your caregiver taking your medical history and performing a physical exam. Additional testing may be required.    Cultures are used to diagnose some STDs, including gonorrhea and chlamydia. A specimen is taken, grown in the  laboratory for a day or two, and then identified. Newer tests can diagnose certain STDs, such as chlamydia, within minutes.    Trichomonas or syphilis can be seen through a microscope, in a sample of discharge.    Syphilis can be seen through a microscope or diagnosed with a blood test.    HIV and hepatitis B can be diagnosed with a blood test.    Pubic lice, which look like tiny bugs in the pubic hair, can usually be seen with a magnifying glass or a microscope.    The appearance of sores or blisters on the skin is enough to make a diagnosis and begin treatment for genital herpes.    HPV (human papilloma virus) is seen on a Pap test. There are several types of HPV that can cause cervical cancer.    Colposcopy (applying special solution and looking at the cervix with a lighted, magnifying tube) is used to see the problem better when diagnosing HPV.    Laparoscopy (looking into the pelvis at the male organs, with a lighted tube, through a small incision) can also be used for diagnosis.   TREATMENT   Chlamydia, gonorrhea, trichomonas, and syphilis can be cured with antibiotics (injected, oral, vaginal creams, suppositories). These are medicines that kill germs.    Genital herpes, hepatitis, and HIV cannot be cured. They often can be treated with medicines, to lessen the symptoms.    Genital warts from HPV can be removed with medicine, freezing, electrocautery (burning), or surgery. But the warts may come back.    All sexual partners should be informed, tested, and treated for all STDs.    Surgery can be used for removal of HPV of the cervix, vagina, or vulva.    If you have one STD, you are also at risk for all others. If one is discovered, treatment often will be started to cover other possible infections. This may be done even if other infections are not proven with testing.   HOME CARE INSTRUCTIONS AND PREVENTION:   Finish all medicine as prescribed. Incomplete treatment of chlamydia and gonorrhea  puts women at risk of becoming sterile (unable to have children). Women are also at risk of having a tubal pregnancy (pregnancy outside the uterus), which can be life threatening. Sterility or tubal pregnancies can occur even in fully treated individuals. Following the prescribed treatment decreases the chance. That is why it is important to finish ALL medicines given to you.    Return immediately if you develop an oral temperature of 100F (37.8C) or higher.    Only take over-the-counter or prescription medicines for  pain, discomfort, or fever as directed by your caregiver.    Rest and eat a balanced diet with plenty of fluids.    Do not have sex until treatment is completed and you have followed up at your caregiver's office or the clinic to which you were referred. If your diagnosis is confirmed by culture or another method, your recent sexual partners need treatment. This is true even if they are problem free or have a negative culture or evaluation. They also should not have sex until their caregiver says it is ok.   STDs should be checked after treatment.   HIV and hepatitis need frequent blood tests and follow-up examinations. This is to monitor the effects of the infection on your body. Any new or worsening symptoms should be reported to your caregiver.    HPV needs follow-up Pap tests.    Only use latex condoms and water-soluble lubricants. Do not use vaseline or oils.    Avoid alcohol and illegal drugs, because they can affect your mind, and you may end up not practicing safe sex.    A vaccine is available for HPV and hepatitis. Everyone should get the vaccine.    Avoid risky sex practices that can break the skin, because it makes you more at risk for an STD.    Oral, vaginal ring, patches, hormone injection contraceptives, spermicides, diaphragm, IUD, and cervical caps do not protect against STDs.   PROGNOSIS  Long-term effects vary, depending on the type and severity of the STD, and the  effectiveness of the treatment. An STD can be treated and cured in one week, one or more months, or an STD and its symptoms can last for many years, or even a lifetime (HIV and hepatitis). STDs can also cause damage to the male organs, chronic pelvic pain, infertility, and recurrences of the STD, especially genital herpes, hepatitis, and HIV.     Trichomonas and pubic lice have few or no long-term effects, other than continued symptoms.     Frequent STDs can cause:    Chronic (ongoing) pelvic pain, or closing of the urethra in the penis.    Chlamydia and gonorrhea can cause:    Infertility.    Chronic pelvic inflammatory disease, and chronic pain.    HPV infections increase a woman's risk of having abnormal cells in her cervix and developing cervical cancer.    HPV causes genital warts, which can come back even after treatment.    Several types of HPV (not warts) cause cervical cancer. HPV is the main cause of cervical cancer.    HIV can progress to AIDS and result in death.     Hepatitis B can cause permanent liver damage, liver cancer, and death.     Syphilis can be cured in the early stages. But in advanced stages, it causes permanent brain and heart damage, and death.    Syphilis during pregnancy, if not treated, can cause:    Deformities.    Death of the baby.   WARNING:You have been diagnosed with an STD, or you may have an STD. All sexually transmitted infections are contagious. People who have an STD or are being evaluated for a possible STD should not have sexual contact with another person until they receive treatment, until the infection has cleared, or until tests are negative for STD. All STDs can be transmitted to babies before or during birth. Effects of STD infection on babies depend on the infection and the effectiveness of treatment. Effects can  include infections, birth defects, and even death.  SEEK MEDICAL CARE IF:   You think you have an STD, even if you do not have symptoms.  Call your caregiver for evaluation and treatment, if needed.    You think or know your sex partner has an STD.    You have any of the symptoms mentioned above.   Document Released: 10/13/2004 Document Re-Released: 02/15/2010  [facility] Patient Information 2011 [place].    Chlamydia, Females and Males  Chlamydia is an infection that can be found in the vagina, urethra, cervix, rectum and pelvic organs in the male. In the male, it most often causes urethritis. This happens when it infects the the tube (urethra) that carries the urine out of the bladder. When Chlamydia causes urethritis, there may be burning with urination. In males, it may also infect the tubes that carry the sperm from the testicle. This causes pain in the testicles and infect the prostate gland. In females, an infection of the pelvic organs is also called PID (pelvic inflammatory disease). PID may be a cause of sudden (acute) lower abdominal/belly (pelvic) pain and fever. But with Chlamydia, the infection sometimes does not cause problems that you notice (asymptomatic). It may cause an abnormal or watery mucous-like discharge from the birth canal (vagina) or penis.    CAUSES  Chlamydia is caused by germs (bacteria) that are spread during sexual contact of the:   Genitals.    Mouth.    Rectum.   This infection may also be passed to a newborn baby coming through the infected birth canal. This causes eye and lung infections in the baby. Chlamydia often goes unnoticed. So it is easy to transmit it to a sexual partner without even knowing.  SYMPTOMS  In females, symptoms may go unnoticed. Symptoms that are more noticeable can include:   Belly (abdominal) pain.    Painful intercourse.     Watery mucous like discharge from the vagina.    Miscarriage.    Discomfort when urinating.    Inflammation of the rectum.      In males they include:   Burning with urination.    Pain in the testicles.    Watery mucous-like discharge from the  penis.   It can cause longstanding (chronic) pelvic pain after frequent infections.  TREATMENT   Chlamydia can be treated with medications which kill germs (antibiotics).    Inform all sexual partners about the infection. All sexual contacts need to be treated.    If you are pregnant, do not take tetracycline type antibiotics.    PID can cause women to not be able to have children (sterile) if left untreated or if half-treated. It does this by scarring the the tubes to the uterus (fallopian tubes). They carry the egg needed to form a baby. It is important to finish ALL medications given to you.    Sterility or future tubal (ectopic) pregnancies can occur in fully treated individuals. It is important to follow your prescribed treatment. That will lessen the chances of these problems.    This is a sexually transmitted infection. So you are also at risk for other sexually transmitted diseases. These include: Gonorrhea and HIV (AIDS). Testing may be done for the other sexually transmitted diseases if one disease is detected.    It is important to treat chlamydia as soon as possible. It can cause damage to other organs.   HOME CARE INSTRUCTIONS:   Finish all medication as prescribed. Incomplete treatment will  put you at risk for not being able to have children (sterility) and tubal pregnancy. If one sexually transmitted disease is discovered, often treatment will be started to cover other possible infections.    Only take over-the-counter or prescription medicines for pain, discomfort, or fever as directed by your caregiver.    Rest.    Eat a balanced diet and drink plenty of fluids.    Warning: This infection is contagious. Do not have sex until treatment is completed. Follow up at your caregiver's office or the clinic to which you were referred. If your diagnosis (learning what is wrong) is confirmed by culture or some other method, your recent sexual contacts need treatment. Even if they are symptom free  or have a negative culture or evaluation, they should be treated.    For the protection of your privacy, test results can not be given over the phone. Make sure you receive the results of your test. Ask how these results are to be obtained if you have not been informed. It is your responsibility to obtain your test results.   PREVENTION   Women should use sanitary pads instead of tampons for vaginal discharge.    Wipe front to back after using the toilet and avoid douching.    Test for chlamydia if you are having an IUD inserted.    Practice safe sex, use condoms, have only one sex partner and be sure your sex partner is not have sex with others.    Ask your caregiver to test you for chlamydia at your regular checkups or sooner if you are having symptoms.    Ask for further information if you are pregnant.   SEEK IMMEDIATE MEDICAL CARE IF:   You develop an oral temperature above 100 F (37.8 C), not controlled by medications or lasting more than 2 days.    You develop an increase in pain.    You develop any type of abnormal discharge.    You develop vaginal bleeding and it is not time for your period.    You develop painful intercourse.   MAKE SURE YOU:     Understand these instructions.    Will watch your condition.    Will get help right away if you are not doing well or get worse.   Document Released: 11/21/2005 Document Re-Released: 11/03/2008  [facility] Patient Information 2011 [place].            Urethritis, Adult  Urethritis is an inflammation (soreness) of the urethra (the tube exiting from the bladder). It is often caused by germs that may be spread through sexual contact.  TREATMENT  Urethritis will usually respond to antibiotics. These are medications that kill germs. Take all the medicine given to you. You may feel better in a couple days, but TAKE ALL MEDICINE or the infection may not be completely cured and may become more difficult to treat. Response can generally be expected in  7 to 10 days. You may require additional treatment after more testing.  IT IS VERY IMPORTANT THAT YOU   Not have sex until the test results are known and treatment is completed.    Know that you may be asked to notify your sex partner when your final test results are back.    Finish all medications as prescribed.    Prevent sexually transmitted infections including AIDS. Practice safe sex. Use condoms.   SEEK MEDICAL CARE IF:   Your symptoms are not improved in 2 to 3 days.  Your symptoms are getting worse.    Your develop abdominal (belly) pain.    You develop joint pain.    You have an oral temperature above 102 F (38.9 C).   SEEK IMMEDIATE MEDICAL CARE IF:   You have an oral temperature above 102 F (38.9 C), not controlled by medicine.    You develop severe pain in the belly, back or side.    You develop repeated vomiting.   TEST RESULTS  Not all test results are available during your visit. If your test results are not back during the visit, make an appointment with your caregiver to find out the results. Do not assume everything is normal if you have not heard from your caregiver or the medical facility. It is important for you to follow-up on all of your test results.  Document Released: 05/17/2001 Document Re-Released: 12/13/2009  [facility] Patient Information 2011 [place].

## 2011-03-05 NOTE — ED Notes (Signed)
Pt c/o dysuria    Crecencio Mc, RN  03/05/11 1914

## 2011-03-05 NOTE — ED Notes (Shared)
PT C/O dysuria and bilateral testicular pain x1 wk. Sexual partner dx with chlamydia. No N/V/D. No fevers. Request STD check.    Physician Note:    I have personally performed and/or participated in the history, exam and medical decision making and agree with all pertinent clinical information.  I have also reviewed and agree with the past medical, family and social history unless otherwise noted.      Janet Berlin, MD

## 2011-03-05 NOTE — ED Provider Notes (Signed)
Patient reports today with dysuria, difficulty urinating and bilateral testicular pressure with urination  x 1 week.  He also reports that his partner is  Positive for chlamydia. He also reports burning abdominal pressure that worsens with urination over the last week as well. He denies fevers, chills, back pain, hematuria, vomiting, or changes in appetite.     Patient is a 25 y.o. male presenting with male genitourinary complaint.   Male GU Problem  Primary symptoms include dysuria.  Primary symptoms include no penile pain. This is a new problem. The current episode started more than 2 days ago. The problem occurs constantly. The problem has not changed since onset.The symptoms occur during urination. Associated symptoms include abdominal pain (lower). Pertinent negatives include no anorexia, no nausea, no vomiting, no frequency and no constipation. There has been no fever. He has tried nothing for the symptoms. Sexual activity: sexually active. Partner displays symptoms of an STD: yes. Associated medical issues do not include gonorrhea or chlamydia.         PAST MEDICAL HISTORY   has no past medical history on file.    PAST SURGICAL HISTORY   has no past surgical history on file.    FAMILY HISTORY  family history is not on file.    SOCIAL HISTORY   reports that he has been smoking.  He does not have any smokeless tobacco history on file. He reports that he does not drink alcohol.    HOME MEDICATIONS     Prior to Admission medications    Medication Sig Start Date End Date Taking? Authorizing Provider   doxycycline (VIBRA-TABS) 100 MG tablet Take 1 tablet by mouth 2 times daily for 10 days. 03/05/11 03/15/11 Yes Princella Pellegrini, NP   hydrocodone-acetaminophen (CO-GESIC) 5-500 MG per tablet Take 1 tablet by mouth every 4 hours as needed for Pain for 20 doses. Sedation precautions.  Do not take Tylenol while on this medicine 07/07/10   Arline Asp, PA        ALLERGIES   has no known allergies.         Review of Systems    Constitutional: Negative for fever and chills.   Gastrointestinal: Positive for abdominal pain (lower). Negative for nausea, vomiting, constipation and anorexia.   Genitourinary: Positive for dysuria, discharge (clear), difficulty urinating and testicular pain (bilateral). Negative for frequency, flank pain, penile swelling and penile pain.   Musculoskeletal: Negative for back pain.   Psychiatric/Behavioral: The patient is not nervous/anxious.    [all other systems reviewed and are negative        Physical Exam   Constitutional: He is oriented to person, place, and time. He appears well-developed and well-nourished. He is active.  Non-toxic appearance. No distress.   HENT:   Head: Normocephalic and atraumatic.   Mouth/Throat: Oropharynx is clear and moist.   Eyes: EOM are normal.   Neck: Normal range of motion. Neck supple.   Pulmonary/Chest: Effort normal and breath sounds normal. No respiratory distress. He has no wheezes.   Abdominal: Soft. Bowel sounds are normal. He exhibits no distension. No tenderness.   Genitourinary: Testes normal and penis normal. Cremasteric reflex is present. Right testis shows no mass, no swelling and no tenderness. Left testis shows no mass, no swelling and no tenderness. Circumcised. No penile tenderness.   Musculoskeletal: Normal range of motion.   Lymphadenopathy:     He has no cervical adenopathy.   Neurological: He is alert and oriented to person, place, and time.  Skin: Skin is warm and dry. No rash noted. He is not diaphoretic. No pallor.   Psychiatric: He has a normal mood and affect. His speech is normal and behavior is normal. Thought content normal.       Procedures    MDM    I discussed with Oran Rein the  exam results, diagnosis, care, prognosis, reasons to return and the importance of follow up. Patient is in full agreement with plan and all questions have been answered.  Yama Nielson is well appearing, non-toxic, and afebrile at the time of discharge.     Arman Filter, MD provided face to face time with this patient and agrees with the above diagnosis and treatment.           Princella Pellegrini, NP  03/05/11 1959    Arman Filter, MD  03/07/11 1356

## 2011-11-03 ENCOUNTER — Emergency Department: Admit: 2011-11-03

## 2011-11-03 ENCOUNTER — Inpatient Hospital Stay: Admit: 2011-11-03 | Discharge: 2011-11-04 | Disposition: A | Discharge status: Other Acute Inpatient Hospital

## 2011-11-03 ENCOUNTER — Inpatient Hospital Stay
Admit: 2011-11-03 | Discharge: 2011-11-03 | Disposition: A | Discharge status: Other Acute Inpatient Hospital | Attending: Psychiatry

## 2011-11-03 DIAGNOSIS — F1910 Other psychoactive substance abuse, uncomplicated: Secondary | ICD-10-CM

## 2011-11-03 DIAGNOSIS — T405X4A Poisoning by cocaine, undetermined, initial encounter: Secondary | ICD-10-CM

## 2011-11-03 LAB — CBC
Hematocrit: 42.2 % (ref 38.5–50.0)
Hemoglobin: 14.7 g/dL (ref 13.2–17.1)
MCH: 30.6 pg (ref 27.0–33.0)
MCHC: 34.9 g/dL (ref 32.0–36.0)
MCV: 87.8 fL (ref 80.0–100.0)
MPV: 9.3 fL (ref 7.5–11.5)
Platelets: 227 10*3/uL (ref 140–400)
RBC: 4.8 10*6/uL (ref 4.20–5.80)
RDW: 13.4 % (ref 11.0–15.0)
WBC: 6.8 10*3/uL (ref 3.8–10.8)

## 2011-11-03 LAB — DIFFERENTIAL
Basophils Absolute: 20 /uL (ref 0–200)
Basophils Relative: 0.3 % (ref 0.0–1.0)
Eosinophils Absolute: 54 /uL (ref 15–500)
Eosinophils Relative: 0.8 % (ref 0.0–8.0)
Lymphocytes Absolute: 2074 /uL (ref 850–3900)
Lymphocytes Relative: 30.5 % (ref 15.0–45.0)
Monocytes Absolute: 721 /uL (ref 200–950)
Monocytes Relative: 10.6 % (ref 0.0–12.0)
Neutrophils Absolute: 3930 /uL (ref 1500–7800)
Neutrophils Relative: 57.8 % (ref 40.0–80.0)

## 2011-11-03 LAB — HEPATIC FUNCTION PANEL
ALT: 243 U/L — ABNORMAL HIGH (ref 9–60)
AST: 115 U/L — ABNORMAL HIGH (ref 17–59)
Albumin: 4.5 g/dL (ref 3.6–5.1)
Alkaline Phosphatase: 155 U/L — ABNORMAL HIGH (ref 40–115)
Bilirubin, Direct: 0 mg/dL (ref 0.0–0.2)
Total Bilirubin: 0.6 mg/dL (ref 0.2–1.2)
Total Protein: 7.3 g/dL (ref 6.2–8.3)

## 2011-11-03 LAB — SALICYLATE LEVEL: Salicylate Lvl: <10 mg/L — ABNORMAL LOW (ref 100–299)

## 2011-11-03 LAB — BASIC METABOLIC PANEL
1/Creatinine: 1.2 ratio
Anion Gap: 13 mmol/L (ref 3–16)
BUN: 20 mg/dL (ref 7–25)
CO2: 26 mmol/L (ref 21–33)
Calcium: 9 mg/dL (ref 8.6–10.2)
Chloride: 102 mmol/L (ref 98–110)
Creatinine: 0.83 mg/dL (ref 0.50–1.30)
GFR MDRD Af Amer: 139 See note.
GFR MDRD Non Af Amer: 115 See note.
Glucose: 94 mg/dL (ref 65–99)
Potassium: 4.7 mmol/L (ref 3.5–5.3)
Sodium: 141 mmol/L (ref 135–146)

## 2011-11-03 LAB — LIPASE: Lipase: 69 U/L (ref 23–300)

## 2011-11-03 LAB — POCT TROPONIN: POC Troponin I: 0 ng/mL (ref 0.00–0.05)

## 2011-11-03 LAB — ACETAMINOPHEN LEVEL: Acetaminophen Level: <10 mg/L (ref 10–20)

## 2011-11-03 NOTE — Unmapped (Signed)
Sitter at bedside. See close observation record. Pt does not verbalize any complaints at this time. Awaiting [facility] to [facility].

## 2011-11-03 NOTE — Unmapped (Signed)
Pt stated that took last night, percocet (15 pills), heroin and cocaine because was upset. A/O x 4, complains of CP, denies SOB, denies fevers. Sitter at bedside, pt has no sign of distress. Skin W/D. Lungs CTA.

## 2011-11-03 NOTE — Unmapped (Signed)
Pt brought in by police due to suicidal ideation. 25-yo WM with history of drug abuse.  No significant PMH. Reported to nursing staff that he overdosed on percocet, heroin, and cocaine last night in an attempt to kill himself.  He is currently complaining of chest pain, throbbing in nature, left sided, non-radiating.  VS stable.  On exam, Lungs-CTAB, no wheezes, rales, or rhonchi; Cardio-RRR, no murmurs, rubs or gallops; pt appears in no acute distress.  Pt is being transferred to [facility] for work-up.  When medically cleared, he needs to return to [facility] for assessment as he is currently on a psychiatric hold.    Guilford Shi, MD  11/03/11 [phone]

## 2011-11-03 NOTE — Unmapped (Signed)
25 year old white male brought in by [facility] police Department.  Reports he took every kind of medicine he could get his hands on last night in an attempt to kill himself.  Reports mom called the police to bring him to the hospital because he needs help. He is complaining of chest pain 6 out of 10 on the scale.  He reports he took several lortab and percocet.  Large amounts of heroin and cocaine. His affect is sad depressed.  He says he doesn't want to die but does feel out of control.  Denies any medical issues.Marland Kitchen

## 2011-11-03 NOTE — Unmapped (Signed)
Patient is resting comfortably.

## 2011-11-03 NOTE — Unmapped (Signed)
Chart review... 3rd shift social worker aware of Pt. Report received from 2nd shift Child psychotherapist.  Elon Jester, Psychiatric Social Worker [phone] or [phone] pager

## 2011-11-03 NOTE — Unmapped (Signed)
Patient updated on plan of care and informed of any delays.  Call light is in reach and bed rails up for safety.  No concerns voiced at this time. The patient was advised to use call light if any concerns or questions.

## 2011-11-03 NOTE — Unmapped (Signed)
Report received from faby rn and care assumed.

## 2011-11-03 NOTE — Unmapped (Signed)
[facility] here for pick up to transport to [facility] for medical evaluation.  Belongings sent with patient.  He is alert, vitals within normal limits on departure.

## 2011-11-03 NOTE — Unmapped (Signed)
Patient is resting comfortably. No complaints at this time.

## 2011-11-03 NOTE — Unmapped (Signed)
[facility] ED Note    Reason for Visit: Drug Overdose      Patient History     HPI:  A 25 year old male with history of depression and prior suicidal attempts who presents to the emergency department today after intentional overdose last night.  Patient reports he had significant recent stressors in his life.  He notes that he is currently financially responsible for many family members and has lost his job.  He reports that last night, he used crack cocaine, heroine, and took 15 Percocet and attempt to kill himself.  Patient was brought this morning directly to [facility] by the police department who evaluated the patient and sent him here for further medical evaluation.    Patient reports left upper quadrant pain, epigastric pain, chest pain.  He reports as been present since last night and seems to be bothering him more now.  He reports it is a difficult time remembering most of last night she is unsure of what the onset was or what exactly felt like last night.  He denies any shortness of breath.  Denies any nausea or vomiting.    Patient denies any fevers or chills.  Denies any other recent times of trying to harm himself.     History reviewed. No pertinent past medical history.    History reviewed. No pertinent past surgical history.    Alexander [name]  reports that he has been smoking Cigarettes.  He has been smoking about 2 packs per day. He does not have any smokeless tobacco history on file. He reports that he drinks about 1.8 ounces of alcohol per week. He reports that he uses illicit drugs (Cocaine, Heroin, Other-see comments, and Marijuana).    Previous Medications    No medications on file       Allergies:   Allergies as of 11/03/2011   ??? (No Known Allergies)       Review of Systems     ROS: See HPI, all other systems are reviewed and are otherwise negative      Physical Exam     ED Triage Vitals   Vital Signs Group      Temp 11/03/11 1719 99.3 ??F (37.4 ??C)     Temp Source 11/03/11 1719 Oral      Heart Rate 11/03/11 1719 74       Heart Rate Source 11/03/11 1719 Monitor      Resp 11/03/11 1719 18       BP 11/03/11 1719 137/93 mmHg      BP Location 11/03/11 1719 Right arm      BP Method 11/03/11 1719 Automatic      Patient Position 11/03/11 1719 Lying   SpO2 11/03/11 1719 100 %   O2 Device 11/03/11 1719 None (Room air)       General:  well nourished, well developed, in no apparent distress    HEENT:  normocephalic, atraumatic, pupils equal round and reactive, sclarea anicteric, moist mucous membranes, no oropharyngeal erythema or mucosal lesions    Neck: Supple    Pulmonary:   lung sounds clear to auscultation bilaterally with good air entry, no respiratory distress    Cardiac:  regular rate and rhythm with no murmurs, rubs, or gallops    Abdomen: Some mild tenderness in the left upper quadrant with no rebound.  Has some voluntary guarding.  Soft, non-distended    Vascular:  2+ peripheral pulses in bilat upper and lower extremties    Skin:  warm and well  perfused    Neuro:  alert and oriented x3, normal gait    Psych: Flat affect.  Positive suicidal ideations.  No hallucinations        Diagnostic Studies     Labs:    Labs Reviewed   ACETAMINOPHEN LEVEL - Abnormal; Notable for the following:     Acetaminophen Level <10 (*)      All other components within normal limits   SALICYLATE LEVEL - Abnormal; Notable for the following:     Salicylate Lvl <10 (*)      All other components within normal limits   HEPATIC FUNCTION PANEL - Abnormal; Notable for the following:     AST 115 (*)      ALT 243 (*)      Alkaline Phosphatase 155 (*)      All other components within normal limits    Narrative:     ADD TO [id number]   BASIC METABOLIC PANEL   CBC   DIFFERENTIAL   POCT TROPONIN   LIPASE    Narrative:     ADD TO [id number]   CBC AND DIFFERENTIAL         Radiology:  Chest x-ray:  Findings:  The lungs are clear. No pneumothorax is identified. The heart and mediastinal contours are within  normal limits. The bones and soft tissues are unremarkable.    Impression:  1. No active cardiopulmonary disease.        EKG:  ----------Electrocardiogram----------  Heart Rate: Normal  RATE: 70  RHYTHM: Normal Sinus Rhythm  AXIS: Normal  ECTOPY: No Ectopy  CONDUCTION: PR: Normal, QRS: Normal, QT: Normal,QTc = 395  ST -T SEGMENT: Normal  CLINICAL IMPRESSION: Normal EKG  Prior EKG: Unchanged  Comment: unchanged from prior dated October 31, 2006        Emergency Department Procedures         ED Course and MDM     Alexander [name] is a 25 y.o. male patient who presented to the emergency department. The patient was evaluated by me and the attending physician. All care plans were discussed and agreed upon.      This is a well-appearing 25 year old male who presents to the emergency department after intentional overdose last evening with suicidal ideation.  Patient reports significant stressors in his life.  He is not currently being treated for depression or anxiety.    Patient was evaluated for overdose of the labs as noted above.  He does have a mild transaminitis which is certainly consistent with alcohol ingestion last night.  He does not have a detectable Tylenol level at this time, or salicylate level.  The remainder of laboratory evaluation is all within normal limits.  He had a portable chest pain as well as epigastric pain.  He had EKG here which is within normal limits.  Chest x-ray which is within normal limits.  And a lipase which is within normal limits.  I did also obtain a single troponin given the fact he had pain since last night I feel that 1 negative troponin is sufficient for him.  At this time in do not suspect that he has acute coronary syndrome.    At this time, I feel that he is medically cleared for transportation back to psychiatric emergency services.    At this time the patient has been transferred to [facility] for further evaluation and management. The patient will continue to be monitored here in  the emergency department until which time he is moved  to his new treatment location.          Clinical Impression:  1. Intentional overdose  2. Suicidal ideation on psychiatric hold      Wilford Sports, MD   [facility]      Critical Care Time (Attendings)         Wilford Sports, MD  Resident  11/03/11 [phone]

## 2011-11-03 NOTE — Unmapped (Signed)
Pt transferring to [facility] ER due to complaint of chest pain.

## 2011-11-03 NOTE — Unmapped (Signed)
ED Screening Protocol- Yes

## 2011-11-03 NOTE — Unmapped (Signed)
Please return to this emergency room if:  You feel unsafe

## 2011-11-04 ENCOUNTER — Inpatient Hospital Stay
Admit: 2011-11-04 | Discharge: 2011-11-07 | Disposition: A | Discharge status: Home / Self Care | Payer: MEDICAID | Attending: Psychiatry | Admitting: Psychiatry

## 2011-11-04 DIAGNOSIS — F3130 Bipolar disorder, current episode depressed, mild or moderate severity, unspecified: Principal | ICD-10-CM

## 2011-11-04 MED ORDER — folic acid (FOLVITE) tablet 1 mg
1 | Freq: Every day | ORAL | Status: AC
Start: 2011-11-04 — End: 2011-11-07
  Administered 2011-11-04 – 2011-11-07 (×4): via ORAL

## 2011-11-04 MED ORDER — LORazepam (ATIVAN) tablet 2 mg
1 | ORAL | Status: AC | PRN
Start: 2011-11-04 — End: 2011-11-07
  Administered 2011-11-07: 04:00:00 via ORAL

## 2011-11-04 MED ORDER — multivitamin (THERAGRAN) tablet 1 tablet
Freq: Every day | ORAL | Status: AC
Start: 2011-11-04 — End: 2011-11-07
  Administered 2011-11-04 – 2011-11-07 (×4): via ORAL

## 2011-11-04 MED ORDER — LORazepam (ATIVAN) tablet 2 mg
1 | ORAL | Status: AC | PRN
Start: 2011-11-04 — End: 2011-11-04

## 2011-11-04 MED ORDER — magnesium hydroxide (MILK OF MAGNESIA) Susp 10 mL
2400 | Freq: Two times a day (BID) | ORAL | Status: AC | PRN
Start: 2011-11-04 — End: 2011-11-07

## 2011-11-04 MED ORDER — diphenhydrAMINE (BENADRYL) injection 50 mg
50 | INTRAMUSCULAR | Status: AC | PRN
Start: 2011-11-04 — End: 2011-11-07

## 2011-11-04 MED ORDER — risperiDONE (RISPERDAL M-TABS) disintegrating tablet 1 mg
1 | Freq: Two times a day (BID) | ORAL | Status: AC
Start: 2011-11-04 — End: 2011-11-07
  Administered 2011-11-04 – 2011-11-07 (×7): via ORAL

## 2011-11-04 MED ORDER — acetaminophen (TYLENOL) tablet 650 mg
325 | ORAL | Status: AC | PRN
Start: 2011-11-04 — End: 2011-11-04

## 2011-11-04 MED ORDER — LORazepam (ATIVAN) tablet 1 mg
1 | ORAL | Status: AC | PRN
Start: 2011-11-04 — End: 2011-11-04
  Administered 2011-11-04: 17:00:00 via ORAL

## 2011-11-04 MED ORDER — LORazepam (ATIVAN) tablet 1 mg
1 | ORAL | Status: AC | PRN
Start: 2011-11-04 — End: 2011-11-04

## 2011-11-04 MED ORDER — sodium chloride 0.9 % flush 10 mL
INTRAMUSCULAR | Status: AC
Start: 2011-11-04 — End: 2011-11-07

## 2011-11-04 MED ORDER — LORazepam (ATIVAN) tablet 1 mg
1 | ORAL | Status: AC | PRN
Start: 2011-11-04 — End: 2011-11-07

## 2011-11-04 MED ORDER — zolpidem (AMBIEN) tablet 5 mg
5 | Freq: Every evening | ORAL | Status: AC | PRN
Start: 2011-11-04 — End: 2011-11-07
  Administered 2011-11-07: 04:00:00 via ORAL

## 2011-11-04 MED ORDER — cloNIDine (CATAPRES) tablet 0.1 mg
0.1 | Freq: Three times a day (TID) | ORAL | Status: AC | PRN
Start: 2011-11-04 — End: 2011-11-07

## 2011-11-04 MED ORDER — nicotine (polacrilex) (NICORETTE) gum 2 mg
2 | BUCCAL | Status: AC | PRN
Start: 2011-11-04 — End: 2011-11-07
  Administered 2011-11-04 – 2011-11-07 (×7): via ORAL

## 2011-11-04 MED ORDER — LORazepam (ATIVAN) injection 2 mg
2 | INTRAMUSCULAR | Status: AC | PRN
Start: 2011-11-04 — End: 2011-11-07

## 2011-11-04 MED ORDER — LORazepam (ATIVAN) tablet 0.5 mg
0.5 | ORAL | Status: AC | PRN
Start: 2011-11-04 — End: 2011-11-04

## 2011-11-04 MED ORDER — risperiDONE (RISPERDAL M-TABS) disintegrating tablet 0.5 mg
0.5 | Freq: Two times a day (BID) | ORAL | Status: AC | PRN
Start: 2011-11-04 — End: 2011-11-07
  Administered 2011-11-06: 14:00:00 via ORAL

## 2011-11-04 MED ORDER — bismuth subsalicylate (PEPTO BISMOL) 262 mg/15 mL suspension 30 mL
262 | ORAL | Status: AC | PRN
Start: 2011-11-04 — End: 2011-11-07

## 2011-11-04 MED ORDER — aluminum & magnesium hydroxide-simethicone (MYLANTA) suspension 15 mL
400-400-40 | Freq: Four times a day (QID) | ORAL | Status: AC | PRN
Start: 2011-11-04 — End: 2011-11-07

## 2011-11-04 MED ORDER — nicotine (NICODERM CQ) 21 mg/24 hr 1 patch
21 | Freq: Every day | TRANSDERMAL | Status: AC
Start: 2011-11-04 — End: 2011-11-07
  Administered 2011-11-05 – 2011-11-07 (×3): via TRANSDERMAL

## 2011-11-04 MED ORDER — ziprasidone (GEODON) injection 20 mg
20 | INTRAMUSCULAR | Status: AC | PRN
Start: 2011-11-04 — End: 2011-11-07

## 2011-11-04 MED ORDER — thiamine (VITAMIN B-1) tablet 100 mg
100 | Freq: Every day | ORAL | Status: AC
Start: 2011-11-04 — End: 2011-11-07
  Administered 2011-11-04 – 2011-11-07 (×4): via ORAL

## 2011-11-04 MED ORDER — loperamideIMODIUMcapsule2mg
2 | ORAL | Status: AC | PRN
Start: 2011-11-04 — End: 2011-11-07

## 2011-11-04 MED FILL — RISPERIDONE M-TAB 1 MG DISINTEGRATING TABLET: 1 1 mg | ORAL | Qty: 1

## 2011-11-04 MED FILL — LORAZEPAM 1 MG TABLET: 1 1 MG | ORAL | Qty: 1

## 2011-11-04 MED FILL — THERA 400 MCG TABLET: 400 400 mcg | ORAL | Qty: 1

## 2011-11-04 MED FILL — NICORELIEF 2 MG GUM: 2 2 mg | BUCCAL | Qty: 1

## 2011-11-04 MED FILL — FOLIC ACID 1 MG TABLET: 1 1 MG | ORAL | Qty: 1

## 2011-11-04 MED FILL — THIAMINE HCL (VITAMIN B1) 100 MG TABLET: 100 100 MG | ORAL | Qty: 1

## 2011-11-04 NOTE — Unmapped (Signed)
[facility] ACCEPT NOTE:    I spoke with the emergency room physician regarding this patient, have reviewed their chart, and accepted them for transfer from [facility] to [facility]. 25 yo male that was sent to [facility] in am after intentional OD of cocaine, heroin, ETOH and 15 percocet. Pt was resent to ED for RUQ pain and Left sided Chest pain. Pt's work up was negative except for elevated AST and ALT. Would like abdominal U/S prior to pt's return to [facility] as pt was drinking and ingested Percocet.  Then we will gladly accept pt for psych eval.         Imelda Pillow

## 2011-11-04 NOTE — Unmapped (Signed)
[facility] scheduled to pick up Pt 2:30 for [facility] transfer.  Elon Jester, Psychiatric Social Worker [phone] or [phone] pager

## 2011-11-04 NOTE — Unmapped (Signed)
Admission interview:    Pt cooperative in interview. Able to contract for safety upon admission to unit. Pt does state he would be suicidal and at risk to overdose again if discharged.     Multiple stressors, including Financial problems and family care issues. 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 Pt. has no job currently. Drinking daily, relapsed on drugs.     Personal/Social/Treatment rights reviewed and pt verbalized understanding.    Negative physical assessment.    Mental health assessment: Oriented x 4. Cooperative and appropriate. A little flat. Endorses significant depression. CFS on unit but still feels he would be suicidal if discharged. Hopes to get referrals for therapy, possible restart medication that he has taken in past.    Pt. oriented to unit. Provided with juices and milk per request. Belongings secured.    Plan: Continue to monitor per unit protocol including q 15 safety checks. Support & reasurrance PRN.    Lenoard Aden, RN

## 2011-11-04 NOTE — Unmapped (Addendum)
Patient is resting comfortably. No complaints at this time.

## 2011-11-04 NOTE — Unmapped (Signed)
Seclusive to room much of the evening. Out for needs.  Pleasant on approach. Calm.  Behavior appropriate.  No complaints or concerns. Med compliant.    Offer support and encouragement as needed.  Monitor for safety and mental status.

## 2011-11-04 NOTE — Unmapped (Signed)
Report called to [facility] on 5. Pt. Pleasant. No complaints

## 2011-11-04 NOTE — Unmapped (Signed)
Pt calm and cooperative. Will monitor and support.

## 2011-11-04 NOTE — Unmapped (Signed)
Pt. smiling, relaxed. Aware he is to be admitted to hospital and is pleased about this. V/S stable. Pt. Given support and reassurance. Monitored closely.

## 2011-11-04 NOTE — Unmapped (Signed)
Pt is a 25 yr old wh male to [facility] with police from his home.  Pt had overdosed on heroin, crack and percoset.  The police brought him to [facility], he was sent to [facility] and is now back in [facility].  Pt lives with his mother and niece and supports them.  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  He didn't make plans well enough to get the family through the winter.  He became very anxious and panicked.  He bought food for family, left money with his mother and used the rest to buy drugs.  Pt has a long hx of drug abuse.  He has been clean for about one yr.  He went through an extended [place] program in [place] his sister lives.  He moved back here to be with rest of the family.  Pt's 2 brothers suicided: one in 2007-05-18 and the other in May 17, 2008.  His father died of cancer 2010/05/17.  His niece belongs to one of the brothers.  Her mother is out of the picture.  Pt remains suicidal without plan.  Still very depressed.  He wants to be admitted -  i need help.  He doesn't want to use again but more importantly he wants to suicidal thoughts to leave.  He is tearful throughout the interview.  Sometimes smiling nervously due to embarrassment.  He states he saw a psychiatrist in [place] who had him on seroquel and tegretol.  This regime was helpful to him.  He wants to get back on meds.      Collateral not possible, there is no phone.  No providers.[facility]  Psychiatric Social Worker Assessment Consult Note      Alexander [name]  [id number]    Presenting Problem: pt attempted to kill himself and remains suicidal.    History:  History of Present Illness: pt has been dx'd with depression.  Two brothers have suicided and he has attempted at least twice.  He has been off meds for about 6-8 months.  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  He also has a serious drug problem.  Went through tx and wa sober for almost a year.  Relapsed 2 days ago with a  suicide attempt  Family History: strong family hx of depression and bipolar disorder.  2 suicides in the family    Chemical Dependence History: pt was using cocaine and opiates before getting sober. 2 days ago he used cocaine, crack, heroin and percosets    Social History, Support System and Current Living Situation: pt lives with his mother and niece who is 5.  His father and 2 brothers are dead; estranged from his only other sib; sister in [place].  He completed 11th grade.  No insurance.  Single, no kids.    Collateral Information: not possible - no phone in the family.  No providers      Mental Status Exam:   Appearance  Apparent Age: Appears Actual Age  Hygiene/Grooming: Disheveled  Physical Abnormalities: none  Apperance Comments: none    Behavior  General Attitude: Within Defined Limits  Motor Activity: Freedom of movement  Eye Contact: Fair  Facial Expression: Pained;Sad  Patient Behaviors: Appropriate for age;Appropriate for situation;Calm;Hopeless;Tearful  Impulsivity: Occasional    Speech  Speech Pattern: Within Defined Limits    Mood/Affect  Mood: Depressed;Despairing;Sad  Affect: Appropriate to Circumstances  Affect congruent with mood: Yes    Thought Content and Process  Content: Within Defined Limits  Delusions: Within Defined Limits  Perception: Appropriate  Hallucination: None  Thought content appropriate to situation: Yes  Danger to Others (WDL): Within Defined Limits  Thought process: Goals directed;Appropriate;Logical    Cognition, Memory and Intelligence  Memory Impairment: None  Cognition: Ability to abstract;Poor judgement;Poor safety awareness;Impulsive  Orientation Level: Oriented X4  Intelligence: Average  Insight: Impaired  Judgement: Impaired    Vegetative Symptoms  Appetite Change: Decreased  Do you have any sleep concerns?: Difficulty falling asleep/insomnia  Libido: Decreased  Interests: no outside interest now    Risk Factors/Stress Factors:  Risk Factors  Self Harm/Suicidal Ideation Plan:  pt overdosed on drugs and remains suicidal with no plan  Previous Self Harm/Suicidal Plans: has attempted to kill himself in the past  Family Suicide History: Yes (2 brothers suicided)  Current Plans of  Homicide or to Harm Another : none  Previous Plans of Homicide or to Harm Another: none  Access to Lethal Means : none  Violent Episode Description: denies  Previous Violent Episode Description: denies  Does Patient's Family Have a History of Aggression or Violence?: No  Any family history of mental health problems?: strong hx of depression and bipolar disorder    Stress Factors  Patient Stress Factors: Exhausted;Financial concerns;Strained family relationships (pt's mother and neice are dependent on pt)  Family Stress Factors: Exhausted;Strained family relationships  Has the patient had any recent losses?: 2 brothers suicided, father died of cancer all within 4 yrs.  How is Patient's Physical Condition: Fair    Formulation and Plan: med review and admit for safety and stabilization.

## 2011-11-04 NOTE — Unmapped (Signed)
INITIAL PHYSICIAN EVALUTION   Alexander [name]   CC: Suicidal Ideation   HPI: 25 y.o. male brought to [facility] by on statement of belief because he was very depressed, stated that he consumed large amounts of legal and illegal drugs in an attempt to commit suicide.   Upon interview in [facility], pt was quite depressed and tearful. He stated that he took the cocaine, heroin, 15 percocet and alcohol in attemtps to kill himself. Looking back on the attempt he knows that it was stupid but is remorseful that it didn't work. For the sake of hismother and his niece, he stated he is glad it didn't work because they depend on him. AT the time of overdose th ept stated he didn't want to do it but could not stop himself. Prior to the attempt pt filled his refridgerator with his eice's favorotie food and bread and milk., He also went to his mother's favorite deli and bought her her favorite sandwich because he knew what was coming. He said that his mother is disabled and if he bought groceries for her, then she would be set for the rest of the week. Pt's father passed from Brain [place] in 2011 (very quickly), and both of pt's brothers completed suicide in 2008 and 2009 respectively.Pt states that this time of year is very difficult especially during Thanksgiving he was reminded how half his family is gone. Pt also stresses because Christmas is coming up and he has no idea how he will pay for gifts for his little niece.   Pt believes that he is stressed out. He is currently taking care of his mom and niece alone. He worked for a [employer] and currently is out of work. His income is the sole income of the family and since his father passed, for some reason his mother has been denied Tree surgeon and disability. Currently they are surviving soley on his niece's social security check from the death of her father. Pt states this check is not even enough to pay the rent. Each month he needs to decide which utilities he can pay and  which ones he can't. Looking ahead now, he does not know what will happen and fears eviction from their home. Knowing that the attempt was imminent pt has been more pro-active about helping his mother unpack boxes and in the new home, and felt that suicide was the only way out from all his stress. Pt feels that his depression has been of and on, and although he is not currently suicidal, he does not want to exist for himself, but for his mother and niece.   Pt's niece is 108 years old and is the pt's brother's daughter. In 2011 - when the pt was in [place] with the rest of his family, his sister cared for the niece. However, since his sister recently had her own child and is married and since pt's mother wanted to move back to [place], the sister gave up the neice to the pt and the mother because she and her husband wanted to stay in [place]. Pt underwent substance abuse treatment in [place] for a total of four months and was clean for approx 1 year prior to relapsing recently with all of the stressors. He has been roaming the streets in search of drugs and stated that he ate 15 percocet and took so much heroin, yet still, nothing happened to him.   No A/VH, No manic sx endorses, No IOR, pt  has been depressed with decreased sleep, appetite, anhedonia, feelings of hopelessness and worthlessness. No access to firearms     Context, strong family hx suicide, recent stress, substance use  Location  [facility] mood  Severity, had a episode of drug use, says it was to kill self, cocaine and heroin, is unclear which came 1st relapse or sub use, seems relapse 1st.  Duration 4 days  Assoc, low energy, anxiety    Past Psychiatric History:   Pt was hospitalized in [place] after his father's death. Pt was depressed and had tried to hang himself. That was his first admission and first attempt. Pt has been diagnosed with Bipolar D/O, Depression and in the past has been on Vistaril, Seroquel 300mg  and Tegretol 150mg , a  combo that he said worked well. Pt has not had outpatient treatment since his move back to [place].   Substance Use History:   Pt smokes approx 2ppd and drinks about a 12 pack everyday recently. Pt has not a had history of seizures of DTs. Pt has undergone one substance abuse treatment program in the past in [place] last year. He did a 3 month inpatient program and the 1 month of a 12 month program because he moved to [place] with his mother and niece to help them out. Pt has not used until this past month due to increased stressors. Pt snorts 2gm of heroin and abuses crack cocaine and pain pills. Pt occasionally smokes weed. He has used cocaine and pills for approx 5 years, stayed clean for 1 year. When pt can not find pain pills he uses heroin and vice versa.   Past Medical History:   Past Medical History    Diagnosis  Date    ???  Depression     ???  Drug abuse and dependence       No past surgical history on file.   Social/Developmental History:   Pt was born and raised in [place]. He moved to [place] in 01-May-2010 following father's death because pt's sister lived their and mother and pt wanted to be close to family. After completing about 4 months of substance abuse treatment, pt moved back to [place] with his mother and his niece to help them while they were here. They thought pt's father family (who live in [place]) would help them out, however they denied the family help. Pt currently lives in 2 family home but has no income. 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 Since then pt has been relying on niece's social security check from her father's passing, and must choose which utility to pay each month. He is unsure how long they will be able to survive with the house. Pt's mother is elderly and disabled, and pt's niece is five years of age. Pt's father died in 05-01-10 from brain ca - quite  quickly. Pt's mother, age 2, was denied disability for her firbromyalgia and her husband's social security widow pension.   Family History:   Pt's Brother Toney Sang completed suicide in 2007-05-02 by overdose, Brother [name] completed suicide in 01-May-2008 by shooting himself.  Mom fibromyalgia     Allergies:   No Known Allergies   Home Medications:   No current facility-administered medications on file prior to encounter.      No current outpatient prescriptions on file prior to encounter.  OBJECTIVE:   Vitals:   Filed Vitals:    11/04/11 0230 11/04/11 0721   BP: 138/99 135/90   Pulse: 70 59   Temp: 97.8 ??F (36.6 ??C) 97.9 ??F (36.6 ??C)   TempSrc: Oral Oral   Resp: 18 18   SpO2: 99%      Physical Exam:   Gen: no acute distress   HEENT: PERRL EOMI no scleral icterus, throat clear, dentition   CV: reg rate/rhythm, no murmurs or rubs   Resp: clear to auscultation bilaterally no wheezes or crackles   Abd: soft, nontender, nondistended, no rebound or guarding, no hepatosplenomegaly, negative Murphy's, Neg McBurney's   Extr: palpable pulses, no pitting edema   Skin: no obvious rashes or lesions, Tattoo on R forearm   Neuro: CN 2-12 intact, gait normal, neg rhomberg, no noticeable tremor, 5/5 muscle strength in UE/LE   ROS: denies CP/SOB, denies nausea/vomiting, diarrhea/constipation.   Mental Status Exam:   Gen/Behavior: 25 yo CM, dressed in hospital attire, tearful but cooperative, groomed ok, spontaneous historian   Orientation: alert, oriented to person, place, purpose   Speech: reg rate/rhythm/prosity, nonpressured, fluent   Mood/Affect: Depressed/Blunted   Thought Process: logical, goal-oriented, no LOA, no FOA   Thought Content: +SI, without plan, neg HI   Attention:Intact   Concentration:Intact - serial 7's   Memory:recent remote and immediate grossly intact   Perceptions: denies AH/VH, does not appear preoccupied with the internal environment, no delusions   Insight/Judgement:Fair/limited   Labs:   Lab Results    Component   Value  Date     WBC  6.8  11/03/2011     HGB  14.7  11/03/2011     HCT  42.2  11/03/2011     MCV  87.8  11/03/2011     PLT  227  11/03/2011      Lab Results    Component  Value  Date     GLUCOSE  94  11/03/2011     BUN  20  11/03/2011     CREATININE  0.83  11/03/2011     K  4.7  11/03/2011     ALBUMIN  4.5  11/03/2011      No results found for this basename: TSH, T3TOTAL, T4TOTAL, T3FREE, FREET4, THYROIDAB      No results found for this basename: VITAMINB12      No results found for this basename: ETOHUR      No results found for this basename: COLORU, CLARITYU, PH, PROTEINUA, PHUR, LABSPEC, GLUCOSEU, KEYTONESU, BLOODU, LEUKOCYTESUR, NITRITE, BILIRUBINUR, UROBILINOGEN, RBCUA, WBCUA, BACTERIA, AMORPHOUS, CRYSTAL, CASTS          Imaging:no head imaging on file   Axis I: Bipolar D/O by Hx, depressed type  Substance abuse with cocaine and opiods  Axis IH:[id number]   Axis LOV:FIEP   Axis PI:RJJOACZYS abuse, unemployment, financial constraints   Axis V: GAF30 on admit, seems higher today 45 50?  ASSESSMENT AND RECOMMENDATIONS: 25 y.o. male brought to [facility] for suicide attempt   FORMULATION: The patient is a 25 yo CM with a history of   Bipolar D/O who was brought by police from for suicide attempt. The patient made   some verbal threats and ingested 15 percocet with ETOH, cocaine and heroin. He was hopeless with no goals and was an imminent risk to himself. Risk factor analysis at this time reveals risk factors including history of mood disorder, history of suicide attempt, family history of completion of suicide, substance abuse, not being  married , limited support network, increased stressors (financial and social). Protective factors include willing to pursue and participate in treatment, medication compliance, motivation to take care of mother and neice. Risk stratification places this patient at a HIGH risk for   future self-harm or harm to others based on these previously mentioned risk factors.   Safety: Pt requires  inpatient hospitalization as the least restrictive means of ensuring safety. He intentionally overdosed on the above substances with plans to commit suicide. He has a family history and prior attempt. Pt also has extensive hx of substance abuse and has limited social support which places him at higher risk than others for suicide. Pt has had so many stressors regarding finances and social issues with mother and niece. Although pt has some future orientation regarding niece and mother, I feel pt is still high risk, especially given the fact that he is very depressed over father's death and has been thinking about his brothers' suicide completion. Pt has been stressed since Thanksgiving and has relapsed and with Christmas approaching pt feels as though he will not be able to go on. Chemical Dependency consult placed, along with CIWA and Opiate w/d protocol. 72 hour hold placed. Medication regimen to be deferred to primary team. Pt will need good outpatient psych follow up at d/c.  He seems to be better today    Will admit, monitor, further eval

## 2011-11-04 NOTE — Unmapped (Signed)
Patient updated on plan of care and informed of any delays.  Call light is in reach and bed rails up for safety.  No concerns voiced at this time. The patient was advised to use call light if any concerns or questions.

## 2011-11-04 NOTE — Unmapped (Signed)
Pt transferred  From [facility] after eval for od on opiates. Pt stared this was an attempt to kill himself. Pt included heroin.

## 2011-11-04 NOTE — Unmapped (Signed)
[facility] Psychiatry  Initial Psychiatric Evaluation    Alexander [name]  [id number]    Chief Complaint:suicidal following relapse on heroin/crack    Subjective:  Patient is a 25 y.o. male who presents with depressed mood and suicidal ideation after pt relapsed on heroi/cocaine after one year clean. 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 He supports his mom and niece, although his niece gets $400.00+  Monthly which goes towards the rent. He spent $500.00 on drugs and was angry with himself afterwards and started having S.I. After using he returned home and eventually told his mom what he did and she referred him to the [facility]. He clearly states he relapsed first and then became suicidal. He denies that he went out using as a suicide attempt. He is stressed by the responsibility of providing for his mom and niece. His 2 brothers suicided (one accidental by OD the other shot self). His father died last year and mom has not been able to get SSI for her fibromyalgia. He is remorseful and is future oriented. All of his sibs were substance users and he started at age 49.   Primary complaints include: feeling depressed, feeling suicidal and financial problems.  Onset of symptoms was abrupt starting 2 days ago with no longer feeling suicidal.     Past Psychiatric History:   In [facility] [place] 2011 for depression and heroin dep. Was diagnosed Bipolar and started on tegretol and Seroquel. Spent 3 months in a drug program and one month in the after care program before moving back to [place]  Currently in treatment with no one.    Substance Abuse History:  cocaine, heroin, marijuana and narcotics  Last Use: 2 days ago  Use of Alcohol: moderate  Use of Caffeine: denies use  Use of OTC: none reported    Past Medical History   Diagnosis Date   ??? Depression    ??? Drug abuse and dependence       No past surgical history on file.   History   Substance Use Topics    ??? Smoking status: Current Everyday Smoker -- 2.0 packs/day     Types: Cigarettes   ??? Smokeless tobacco: Not on file   ??? Alcohol Use: 1.8 oz/week     3 Cans of beer per week      History reviewed. No pertinent family history.     Education: left HS 12th grade  Other Pertinent History: Financial and loss of 2 brothers and dad.Finacially supporting mom and niece    No prescriptions prior to admission     No Known Allergies     Physical Review Of Systems:  A comprehensive review of systems was negative.    Objective:  Vital signs in last 24 hours:  Temp:  [97.8 ??F (36.6 ??C)-99.3 ??F (37.4 ??C)] 97.9 ??F (36.6 ??C)  Heart Rate:  [59-99] 59   Resp:  [16-18] 18   BP: (134-149)/(68-99) 135/90 mmHg        Mental Status Evaluation:  Appearance:  age appropriate, disheveled and tattooed   Behavior:  normal   Speech:  normal pitch and normal volume   Mood:  stressed and mildly anxious   Affect:  mood-congruent   Thought Process:  normal   Thought Content:  denies s.i.but states if he did not have his mom and niece he would have nothing to live for   Sensorium:  person, place, time/date,  situation, day of week, month of year and year   Memory: intact   Cognition:  intact   Insight:  limited   Judgment:  limited     Diagnoses:  Axis I: Substance Induced Mood Disorder PSD, Mood dis nos        Axis II: No diagnosis  Axis III: deferred  Axis IV: economic problems, occupational problems and problems with access to health care services  Axis V: 31-40 impairment in reality testing    Assessment and Plan:    Assessment: 25 yo SCM presents with depressed mood and suicidal id after relapsing on heroin/cocaine    Plan: Educated re: r/b/se/alt for informed consent. Risperdal 1 mg po bid for mood.Refer to outpatient upon d/c. Groups, obtain collateral. CD referral.        Erma Heritage. Clarene Reamer  11/04/2011

## 2011-11-04 NOTE — Unmapped (Signed)
Transferred to [facility] by [facility]

## 2011-11-04 NOTE — Unmapped (Signed)
Pt visible on unit, has adjusted well to milieu.    Social with various peers. Affect brighter now than when initially interviewed at time of admission.    Ate well at [facility] meal.    Readily accepted newly ordered Risperdal - M 1mg  tab. And pt educated about purpose of med.    Plan: Continue to monitor per unit protocol including q 15 min safety checks. Support and reassurance PRN.    Lenoard Aden, RN

## 2011-11-04 NOTE — Unmapped (Signed)
This is an addendum to the prior dictation for the pt. Out of request of [facility] MD given elevation LFTS an RUQ U/S was done, which was essentially nl - no sonographic murphys sign, no PCF, no stones, no sludge, CBD was not visualized however. Safe to transfer pt to [facility].     Janene Harvey, MD  Resident  11/04/11 [phone]

## 2011-11-04 NOTE — Unmapped (Signed)
PES PHYSICIAN EVALUTION    Alexander [name]    CC: Suicidal Ideation    HPI: 25 y.o. male brought to PES by on statement of belief because he was very depressed, stated that he consumed large amounts of legal and illegal drugs in an attempt to commit suicide.    Upon interview, pt was quite depressed and tearful. He stated that he took the cocaine, heroin, 15 percocet and alcohol in attemtps to kill himself. Looking back on the attempt he knows that it was stupid but is remorseful that it didn't work. FOr the sake of hismother and his niece, he stated he is glad it didn't work because they depend on him. AT the time of overdose th ept stated he didn't want to do it but could not stop himself. Prior to the attempt pt filled his refridgerator with his eice's favorotie food and bread and milk., He also went to his mother's favorite deli and bought her her favorite sandwich because he knew what was coming. He said that his mother is disabled and if he bought groceries for her, then she would be set for the rest of the week. Pt's father passed from Brain [place] in 2011 (very quickly), and both of pt's brothers completed suicide in 2008 and 2009 respectively.Pt states that this time of year is very difficult especially during Thanksgiving he was reminded how half his family is gone. Pt also stresses because Christmas is coming up and he has no idea how he will pay for gifts for his little niece.     Pt believes that he is stressed out. He is currently taking care of his mom and niece alone. He worked for a [employer] and currently is out of work. His income is the sole income of the family and since his father passed, for some reason his mother has been denied Tree surgeon and disability. Currently they are surviving soley on his niece's social security check from the death of her father. Pt states this check is not even enough to pay the rent. Each month he needs to decide which utilities he can pay and which  ones he can't. Looking ahead now, he does not know what will happen and fears eviction from their home. Knowing that the attempt was imminent pt has been more pro-active about helping his mother unpack boxes and in the new home, and felt that suicide was the only way out from all his stress. Pt feels that his depression has been of and on, and although he is not currently suicidal, he does not want to exist for himself, but for his mother and niece.     Pt's niece is 8 years old and is the pt's brother's daughter. In 2011 - when the pt was in [place] with the rest of his family, his sister cared for the niece. However, since his sister recently had her own child and is married and since pt's mother wanted to move back to [place], the sister gave up the neve to the pt and the mother because she and her husband wanted to stay in [place]. Pt underwent substance abuse treatment in [place] for a total of four months and was clean for approx 1 year prior to relapsing recently with all of the stressors. He has been roaming the streets in search of drugs and stated that he ate 15 percocet and took so much heroin, yet still, nothing happened to him.     No  A/VH, No manic sx endorses, No IOR, pt has been depressed with decreased sleep, appetite, anhedonia, feelings of hopelessness and worthlessness. No access to firearms      Past Psychiatric History:   Pt was hospitalized in [place] after his father's death. Pt was depressed and had tried to hang himself. That was his first admission and first attempt. Pt has been diagnosed with Bipolar D/O, Depression and in the past has been on Vistaril, Seroquel 300mg  and Tegretol 150mg , a combo that he said worked well. Pt has not had outpatient treatment since his move back to [place].     Substance Use History:  Pt smokes approx 2ppd and drinks about a 12 pack everyday recently. Pt has not a had history of seizures of DTs. Pt has undergone one substance abuse  treatment program in the past in [place] last year. He did a 3 month inpatient program and the 1 month of a 12 month program because he moved to [place] with his mother and niece to help them out. Pt has not used until this past month due to increased stressors. Pt snorts 2gm of heroin and abuses crack cocaine and pain pills. Pt occasionally smokes weed. He has used cocaine and pills for approx 5 years, stayed clean for 1 year. When pt can not find pain pills he uses heroin and vice versa.     Past Medical History:  Past Medical History   Diagnosis Date   ??? Depression    ??? Drug abuse and dependence      No past surgical history on file.    Social/Developmental History:   Pt was born and raised in [place]. He moved to [place] in 2010/05/14 following father's death because pt's sister lived their and mother and pt wanted to be close to family. After completing about 4 months of substance abuse treatment, pt moved back to [place] with his mother and his niece to help them while they were here. They thought pt's father family (who live in [place]) would help them out, however they denied the family help. Pt currently lives in 2 family home but has no income. 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 Since then pt has been relying on niece's social security check from her father's passing, and must choose which utility to pay each month. He is unsure how long they will be able to survive with the house. Pt's mother is elderly and disabled, and pt's niece is five years of age. Pt's father died in 14-May-2010 from brain ca - quite quickly. Pt's mother was denied disability and her husband's social security widow pension.       Family History:   Pt's Brother Alexander [name] completed suicide in 05/15/2007 by overdose, Brother Alexander [name] completed suicide in 2008-05-14 by shooting himself.     Allergies:  No Known Allergies    Home  Medications:   No current facility-administered medications on file prior to encounter.     No current outpatient prescriptions on file prior to encounter.       Current Medications ordered:  Scheduled Meds:     Continuous Infusions:     PRN Meds:.         OBJECTIVE:  Vitals:  .  Filed Vitals:    11/04/11 0230   BP: 138/99   Pulse: 70   Temp: 97.8 ??F (36.6 ??  C)   TempSrc: Oral   Resp: 18   SpO2: 99%       Physical Exam:  Gen: no acute distress   HEENT: PERRL EOMI no scleral icterus, throat clear, dentition  CV: reg rate/rhythm, no murmurs or rubs  Resp: clear to auscultation bilaterally no wheezes or crackles  Abd: soft, nontender, nondistended, no rebound or guarding, no hepatosplenomegaly, negative Murphy's, Neg McBurney's  Extr: palpable pulses, no pitting edema  Skin: no obvious rashes or lesions, Tattoo on R forearm  Neuro: CN 2-12 intact, gait normal, neg rhomberg, no noticeable tremor, 5/5 muscle strength in UE/LE  ROS: denies CP/SOB, denies nausea/vomiting, diarrhea/constipation.    Mental Status Exam:   Gen/Behavior: 25 yo CM, dressed in hospital attire, tearful but cooperative, maintains limited eye contact  Orientation: alert, oriented to person, place, purpose  Speech: reg rate/rhythm/prosity, nonpressured, fluent  Mood/Affect: Depressed/Blunted  Thought Process: logical, goal-oriented, no LOA, no FOA  Thought Content: +SI, without plan, neg HI  Attention:Intact  Concentration:Intact - serial 7's  Memory:recent remote and immediate grossly intact  Perceptions: denies AH/VH, does not appear preoccupied with the internal environment, no delusions  Insight/Judgement:Fair/limited    Labs:  Lab Results   Component Value Date    WBC 6.8 11/03/2011    HGB 14.7 11/03/2011    HCT 42.2 11/03/2011    MCV 87.8 11/03/2011    PLT 227 11/03/2011     Lab Results   Component Value Date    GLUCOSE 94 11/03/2011    BUN 20 11/03/2011    CREATININE 0.83 11/03/2011    K 4.7 11/03/2011    ALBUMIN 4.5 11/03/2011     No results  found for this basename: TSH, T3TOTAL, T4TOTAL, T3FREE, FREET4, THYROIDAB     No results found for this basename: VITAMINB12     No results found for this basename: ETOHUR     No results found for this basename: COLORU, CLARITYU, PH, PROTEINUA, PHUR, LABSPEC, GLUCOSEU, KEYTONESU, BLOODU, LEUKOCYTESUR, NITRITE, BILIRUBINUR, UROBILINOGEN, RBCUA, WBCUA, BACTERIA, AMORPHOUS, CRYSTAL, CASTS           Imaging:no head imaging on file    Axis I: Bipolar D/O by Hx, depressed type  Axis VW:[id number]  Axis NWG:NFAO  Axis ZH:YQMVHQION abuse, unemployment, financial constraints  Axis V: GAF30    ASSESSMENT AND RECOMMENDATIONS: 25 y.o. male brought to PES for suicide attempt  FORMULATION:  The patient is a 25 yo CM with a history of  Bipolar D/O who was brought by police from for suicide attempt.  The patient made   some verbal threats and ingested 15 percocet with ETOH, cocaine and heroin. He is hopeless with no goals and is an imminent risk to himself. Risk factor analysis at this time reveals risk factors  including history of mood disorder, history of suicide attempt, sfamily history of completion of suicide, substance abuse, not being married , limited support network, increased stressors (financial and social).  Protective factors include willing to pursue and participate in treatment, medication compliance, motivation to take care of mother and neice.  Risk stratification places this patient at a HIGH risk for   future self-harm or harm to others based on these previously mentioned risk  factors.     Safety: Pt requires inpatient hospitalization as the least restrictive means of ensuring safety. He intentionally overdosed on the above substances with plans to commit suicide. He has a family history and prior attempt. Pt also has extensive hx of substance abuse and has limited  social support which places him at higher risk than others for suicide. Pt has had so many stressors regarding finances and social issues with mother  and niece. Although pt has some future orientation regarding niece and mother, I feel pt is still high risk, especially given the fact that he is very depressed over father's death and has been thinking about his brothers' suicide completion. Pt has been stressed since Thanksgiving and has relapsed and with Christmas approaching pt feels as though he will not be able to go on. Chemical Dependency consult placed, along with CIWA and Opiate w/d protocol. 72 hour hold placed. Medication regimen to be deferred to primary team. Pt will need good outpatient psych follow up at d/c.   Dispo: 7w    Substance Abuse/Dep issues: Rise Paganini DO  Pager [phone]  Plan discussed and agreed upon with attending physician Dr. Ernestina Columbia, DO  Resident  11/04/11 [phone]

## 2011-11-05 MED ORDER — hydrOXYzine (VISTARIL) capsule 25 mg
25 | Freq: Four times a day (QID) | ORAL | Status: AC | PRN
Start: 2011-11-05 — End: 2011-11-07
  Administered 2011-11-05 – 2011-11-07 (×3): via ORAL

## 2011-11-05 MED FILL — NICORELIEF 2 MG GUM: 2 2 mg | BUCCAL | Qty: 1

## 2011-11-05 MED FILL — THIAMINE HCL (VITAMIN B1) 100 MG TABLET: 100 100 MG | ORAL | Qty: 1

## 2011-11-05 MED FILL — RISPERIDONE M-TAB 1 MG DISINTEGRATING TABLET: 1 1 mg | ORAL | Qty: 1

## 2011-11-05 MED FILL — THERA 400 MCG TABLET: 400 400 mcg | ORAL | Qty: 1

## 2011-11-05 MED FILL — HYDROXYZINE PAMOATE 25 MG CAPSULE: 25 25 MG | ORAL | Qty: 1

## 2011-11-05 MED FILL — FOLIC ACID 1 MG TABLET: 1 1 MG | ORAL | Qty: 1

## 2011-11-05 MED FILL — NICOTINE 21 MG/24 HR DAILY TRANSDERMAL PATCH: 21 21 mg/24 hr | TRANSDERMAL | Qty: 1

## 2011-11-05 NOTE — Unmapped (Signed)
Patient was able to rest in bed at intervals with eyes closed. At 03:05 pt. Requested and was given nicorette gum staff reminded pt. To spit out gum if he became tired and wanted to return to sleep. Staff continues to monitor patient per unit protocol.  Yaakov Guthrie, RN

## 2011-11-05 NOTE — Unmapped (Signed)
Pt up on unit in the am. Ate meals took meds. Pt reports that the meds help him stay focused. Affect unremarkable. Pt's conversation is organized, focused comprehensible.

## 2011-11-05 NOTE — Unmapped (Signed)
Subsequent PHYSICIAN EVALUTION   Alexander [name]   CC: Suicidal Ideation     Patient on ward, seems intact, social with peers, no si/hi, says he afraid leave hospital, we have no collateral from family or other sources, he seems to of made friends with some patients who also have drug abuse issues, goal directed, calm, I suspect he could be d/c in 1 to 2 days>    Context, strong family hx suicide, recent stress, substance use  LocationAMS mood  Severity, had a episode of drug use, says it was to kill self, cocaine and heroin, is unclear which came 1st relapse or sub use, seems relapse 1st.  Duration 4 days  Assoc, low energy, anxiety      HPI: 25 y.o. male brought to PES by on statement of belief because he was very depressed, stated that he consumed large amounts of legal and illegal drugs in an attempt to commit suicide.   Upon interview in PES, pt was quite depressed and tearful. He stated that he took the cocaine, heroin, 15 percocet and alcohol in attemtps to kill himself. Looking back on the attempt he knows that it was stupid but is remorseful that it didn't work. For the sake of hismother and his niece, he stated he is glad it didn't work because they depend on him. AT the time of overdose th ept stated he didn't want to do it but could not stop himself. Prior to the attempt pt filled his refridgerator with his eice's favorotie food and bread and milk., He also went to his mother's favorite deli and bought her her favorite sandwich because he knew what was coming. He said that his mother is disabled and if he bought groceries for her, then she would be set for the rest of the week. Pt's father passed from Brain [place] in 2011 (very quickly), and both of pt's brothers completed suicide in 2008 and 2009 respectively.Pt states that this time of year is very difficult especially during Thanksgiving he was reminded how half his family is gone. Pt also stresses because Christmas is coming up and he has no idea how  he will pay for gifts for his little niece.   Pt believes that he is stressed out. He is currently taking care of his mom and niece alone. He worked for a [employer] and currently is out of work. His income is the sole income of the family and since his father passed, for some reason his mother has been denied Tree surgeon and disability. Currently they are surviving soley on his niece's social security check from the death of her father. Pt states this check is not even enough to pay the rent. Each month he needs to decide which utilities he can pay and which ones he can't. Looking ahead now, he does not know what will happen and fears eviction from their home. Knowing that the attempt was imminent pt has been more pro-active about helping his mother unpack boxes and in the new home, and felt that suicide was the only way out from all his stress. Pt feels that his depression has been of and on, and although he is not currently suicidal, he does not want to exist for himself, but for his mother and niece.   Pt's niece is 56 years old and is the pt's brother's daughter. In 2011 - when the pt was in [place] with the rest of his family, his sister cared for the niece.  However, since his sister recently had her own child and is married and since pt's mother wanted to move back to [place], the sister gave up the neice to the pt and the mother because she and her husband wanted to stay in [place]. Pt underwent substance abuse treatment in [place] for a total of four months and was clean for approx 1 year prior to relapsing recently with all of the stressors. He has been roaming the streets in search of drugs and stated that he ate 15 percocet and took so much heroin, yet still, nothing happened to him.   No A/VH, No manic sx endorses, No IOR, pt has been depressed with decreased sleep, appetite, anhedonia, feelings of hopelessness and worthlessness. No access to firearms     Past Psychiatric  History:   Pt was hospitalized in [place] after his father's death. Pt was depressed and had tried to hang himself. That was his first admission and first attempt. Pt has been diagnosed with Bipolar D/O, Depression and in the past has been on Vistaril, Seroquel 300mg  and Tegretol 150mg , a combo that he said worked well. Pt has not had outpatient treatment since his move back to [place].   Substance Use History:   Pt smokes approx 2ppd and drinks about a 12 pack everyday recently. Pt has not a had history of seizures of DTs. Pt has undergone one substance abuse treatment program in the past in [place] last year. He did a 3 month inpatient program and the 1 month of a 12 month program because he moved to [place] with his mother and niece to help them out. Pt has not used until this past month due to increased stressors. Pt snorts 2gm of heroin and abuses crack cocaine and pain pills. Pt occasionally smokes weed. He has used cocaine and pills for approx 5 years, stayed clean for 1 year. When pt can not find pain pills he uses heroin and vice versa.   Past Medical History:   Past Medical History    Diagnosis  Date    ???  Depression     ???  Drug abuse and dependence       No past surgical history on file.   Social/Developmental History:   Pt was born and raised in [place]. He moved to [place] in 04/29/2010 following father's death because pt's sister lived their and mother and pt wanted to be close to family. After completing about 4 months of substance abuse treatment, pt moved back to [place] with his mother and his niece to help them while they were here. They thought pt's father family (who live in [place]) would help them out, however they denied the family help. Pt currently lives in 2 family home but has no income. 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 Since then pt has been relying  on niece's social security check from her father's passing, and must choose which utility to pay each month. He is unsure how long they will be able to survive with the house. Pt's mother is elderly and disabled, and pt's niece is five years of age. Pt's father died in 04/29/10 from brain ca - quite quickly. Pt's mother, age 34, was denied disability for her firbromyalgia and her husband's social security widow pension.   Family History:   Pt's Brother Toney Sang completed suicide in  2008 by overdose, Brother [name] completed suicide in 2009 by shooting himself.  Mom fibromyalgia   OBJECTIVE:   Vitals:   Filed Vitals:    11/04/11 0230 11/04/11 0721 11/05/11 0824   BP: 138/99 135/90 131/85   Pulse: 70 59 103   Temp: 97.8 ??F (36.6 ??C) 97.9 ??F (36.6 ??C) 97.6 ??F (36.4 ??C)   TempSrc: Oral Oral    Resp: 18 18 18    SpO2: 99%         ROS: denies CP/SOB, denies nausea/vomiting, diarrhea/constipation.     Mental Status Exam:   Gen/Behavior: 25 yo CM, dressed in hospital attire, tearful but cooperative, groomed ok, spontaneous historian   Orientation: alert, oriented to person, place, purpose   Speech: reg rate/rhythm/prosity, nonpressured, fluent   Mood/Affect: Depressed/Blunted   Thought Process: logical, goal-oriented, no LOA, no FOA   Thought Content: +SI, without plan, neg HI   Attention:Intact   Concentration:Intact - serial 7's   Memory:recent remote and immediate grossly intact   Perceptions: denies AH/VH, does not appear preoccupied with the internal environment, no delusions   Insight/Judgement:Fair/limited     Axis I: Bipolar D/O by Hx, depressed type  Substance abuse with cocaine and opiods  Axis ZO:XWRUEAVW   Axis UJW:JXBJ   Axis YN:WGNFAOZHY abuse, unemployment, financial constraints   Axis V: GAF30 on admit, seems higher today 45 50?    Patient seems ok on ward, I suspect drug use a bigger part of problem, I also suspect he is distorting the home story some, he looks intact and goal directed.  Continue meds and tx

## 2011-11-05 NOTE — Unmapped (Signed)
Pt has been up on unit much of the day. Interacting with other peers. M/c 1-1 pt reported he was being stressed out at home and relapsed. Pt states he needs to be on his meds and not do street drugs, and get some follow up care. Pts reports last year being difficult. Deaths, monies, etc.

## 2011-11-05 NOTE — Unmapped (Signed)
Occupational Therapy/Theraputic Recreation Progress Note      Raymie Giammarco  [id number]      Group: Task/Project  Type of Group Interaction: Interaction with Group Leaders/Peers and Followed Directions  Level of Participation: Active, Attended/Number of Minutes: 60/60, Interacted with Group Leaders, Remained on Task/Topic/Number of Minutes: 60/60, Initiated Comments and Followed Directions    Came to group after meeting 1:1 with RN. Worked independently on freehand drawing of a snowman; +sense of humor about skill level I draw at a 5th grade level said he planned to give the picture to my little girl, pleasant, bright affect, social with select peers.    Rosalyn Archambault N Tiea Manninen  11/05/2011 12:24 PM

## 2011-11-06 MED ORDER — nicotine (polacrilex) (NICORETTE) gum 2 mg
2 | Freq: Once | BUCCAL | Status: AC
Start: 2011-11-06 — End: 2011-11-06
  Administered 2011-11-06: 18:00:00 via ORAL

## 2011-11-06 MED FILL — RISPERIDONE M-TAB 1 MG DISINTEGRATING TABLET: 1 1 mg | ORAL | Qty: 1

## 2011-11-06 MED FILL — NICOTINE 21 MG/24 HR DAILY TRANSDERMAL PATCH: 21 21 mg/24 hr | TRANSDERMAL | Qty: 1

## 2011-11-06 MED FILL — NICORELIEF 2 MG GUM: 2 2 mg | BUCCAL | Qty: 1

## 2011-11-06 MED FILL — ZOLPIDEM 5 MG TABLET: 5 5 MG | ORAL | Qty: 1

## 2011-11-06 MED FILL — FOLIC ACID 1 MG TABLET: 1 1 MG | ORAL | Qty: 1

## 2011-11-06 MED FILL — HYDROXYZINE PAMOATE 25 MG CAPSULE: 25 25 MG | ORAL | Qty: 1

## 2011-11-06 MED FILL — THIAMINE HCL (VITAMIN B1) 100 MG TABLET: 100 100 MG | ORAL | Qty: 1

## 2011-11-06 MED FILL — LORAZEPAM 1 MG TABLET: 1 1 MG | ORAL | Qty: 2

## 2011-11-06 MED FILL — THERA 400 MCG TABLET: 400 400 mcg | ORAL | Qty: 1

## 2011-11-06 NOTE — Unmapped (Signed)
Subsequent PHYSICIAN EVALUTION   Alexander [name]   CC: Suicidal Ideation     Patient on ward, seems intact, social with peers, no si/hi, says he afraid leave hospital, we have no collateral from family or other sources, he seems to of made friends with some patients who also have drug abuse issues, goal directed, calm, I suspect he could be d/c in 1 to 2 days> doing better    Context, strong family hx suicide, recent stress, substance use  LocationAMS mood  Severity, had a episode of drug use, says it was to kill self, cocaine and heroin, is unclear which came 1st relapse or sub use, seems relapse 1st.  Duration 4 days  Assoc, low energy, anxiety      HPI: 25 y.o. male brought to [facility] by on statement of belief because he was very depressed, stated that he consumed large amounts of legal and illegal drugs in an attempt to commit suicide.   Upon interview in [facility], pt was quite depressed and tearful. He stated that he took the cocaine, heroin, 15 percocet and alcohol in attemtps to kill himself. Looking back on the attempt he knows that it was stupid but is remorseful that it didn't work. For the sake of hismother and his niece, he stated he is glad it didn't work because they depend on him. AT the time of overdose th ept stated he didn't want to do it but could not stop himself. Prior to the attempt pt filled his refridgerator with his eice's favorotie food and bread and milk., He also went to his mother's favorite deli and bought her her favorite sandwich because he knew what was coming. He said that his mother is disabled and if he bought groceries for her, then she would be set for the rest of the week. Pt's father passed from Brain [place] in 2011 (very quickly), and both of pt's brothers completed suicide in 2008 and 2009 respectively.Pt states that this time of year is very difficult especially during Thanksgiving he was reminded how half his family is gone. Pt also stresses because Christmas is coming up and he has  no idea how he will pay for gifts for his little niece.   Pt believes that he is stressed out. He is currently taking care of his mom and niece alone. He worked for a [employer] and currently is out of work. His income is the sole income of the family and since his father passed, for some reason his mother has been denied Tree surgeon and disability. Currently they are surviving soley on his niece's social security check from the death of her father. Pt states this check is not even enough to pay the rent. Each month he needs to decide which utilities he can pay and which ones he can't. Looking ahead now, he does not know what will happen and fears eviction from their home. Knowing that the attempt was imminent pt has been more pro-active about helping his mother unpack boxes and in the new home, and felt that suicide was the only way out from all his stress. Pt feels that his depression has been of and on, and although he is not currently suicidal, he does not want to exist for himself, but for his mother and niece.   Pt's niece is 29 years old and is the pt's brother's daughter. In 2011 - when the pt was in [place] with the rest of his family, his sister cared for  the niece. However, since his sister recently had her own child and is married and since pt's mother wanted to move back to [place], the sister gave up the neice to the pt and the mother because she and her husband wanted to stay in [place]. Pt underwent substance abuse treatment in [place] for a total of four months and was clean for approx 1 year prior to relapsing recently with all of the stressors. He has been roaming the streets in search of drugs and stated that he ate 15 percocet and took so much heroin, yet still, nothing happened to him.   No A/VH, No manic sx endorses, No IOR, pt has been depressed with decreased sleep, appetite, anhedonia, feelings of hopelessness and worthlessness. No access to firearms     Past  Psychiatric History:   Pt was hospitalized in [place] after his father's death. Pt was depressed and had tried to hang himself. That was his first admission and first attempt. Pt has been diagnosed with Bipolar D/O, Depression and in the past has been on Vistaril, Seroquel 300mg  and Tegretol 150mg , a combo that he said worked well. Pt has not had outpatient treatment since his move back to [place].   Substance Use History:   Pt smokes approx 2ppd and drinks about a 12 pack everyday recently. Pt has not a had history of seizures of DTs. Pt has undergone one substance abuse treatment program in the past in [place] last year. He did a 3 month inpatient program and the 1 month of a 12 month program because he moved to [place] with his mother and niece to help them out. Pt has not used until this past month due to increased stressors. Pt snorts 2gm of heroin and abuses crack cocaine and pain pills. Pt occasionally smokes weed. He has used cocaine and pills for approx 5 years, stayed clean for 1 year. When pt can not find pain pills he uses heroin and vice versa.   Past Medical History:   Past Medical History    Diagnosis  Date    ???  Depression     ???  Drug abuse and dependence       No past surgical history on file.   Social/Developmental History:   Pt was born and raised in [place]. He moved to [place] in 26-May-2010 following father's death because pt's sister lived their and mother and pt wanted to be close to family. After completing about 4 months of substance abuse treatment, pt moved back to [place] with his mother and his niece to help them while they were here. They thought pt's father family (who live in [place]) would help them out, however they denied the family help. Pt currently lives in 2 family home but has no income. 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 Since then pt has  been relying on niece's social security check from her father's passing, and must choose which utility to pay each month. He is unsure how long they will be able to survive with the house. Pt's mother is elderly and disabled, and pt's niece is five years of age. Pt's father died in 2010/05/26 from brain ca - quite quickly. Pt's mother, age 72, was denied disability for her firbromyalgia and her husband's social security widow pension.   Family History:   Pt's Brother [name] completed  suicide in 2008 by overdose, Brother Sharia Reeve completed suicide in 2009 by shooting himself.  Mom fibromyalgia   OBJECTIVE:   Vitals:   Filed Vitals:    11/04/11 0230 11/04/11 0721 11/05/11 0824 11/06/11 0802   BP: 138/99 135/90 131/85 136/85   Pulse: 70 59 103 71   Temp: 97.8 ??F (36.6 ??C) 97.9 ??F (36.6 ??C) 97.6 ??F (36.4 ??C) 97.5 ??F (36.4 ??C)   TempSrc: Oral Oral     Resp: 18 18 18 18    SpO2: 99%          ROS: denies CP/SOB, denies nausea/vomiting, diarrhea/constipation.     Mental Status Exam:   Gen/Behavior: 25 yo CM, dressed in hospital attire, tearful but cooperative, groomed ok, spontaneous historian   Orientation: alert, oriented to person, place, purpose   Speech: reg rate/rhythm/prosity, nonpressured, fluent   Mood/Affect: Depressed/Blunted   Thought Process: logical, goal-oriented, no LOA, no FOA   Thought Content: +SI, without plan, neg HI   Attention:Intact   Concentration:Intact - serial 7's   Memory:recent remote and immediate grossly intact   Perceptions: denies AH/VH, does not appear preoccupied with the internal environment, no delusions   Insight/Judgement:Fair/limited     Axis I: Bipolar D/O by Hx, depressed type  Substance abuse with cocaine and opiods  Axis ZO:[id number]   Axis UJW:JXBJ   Axis YN:WGNFAOZHY abuse, unemployment, financial constraints   Axis V: GAF30 on admit, seems higher today 45 50?    Patient seems ok on ward, I suspect drug use a bigger part of problem, I also suspect he is distorting the home story some, he  looks intact and goal directed.  Continue meds and tx

## 2011-11-06 NOTE — Unmapped (Signed)
Pt out on unit throughout shift. Eating meals. M/c pt given prn of nicorette gum. Pt states he feels he might do well if dr. Would order an antidepressant. Has not been observed for withdrawal symp. Pleasant mood.Marland Kitchen

## 2011-11-06 NOTE — Unmapped (Signed)
Pt attended group on barriers to tx, relationship stressors and loss and was cooperative and verbal.  He disclosed issues with the loss of his father, and the suicides of both of his brothers.  He wants individual therapy as well as family therapy with his mother.

## 2011-11-06 NOTE — Unmapped (Signed)
Occupational Therapy/Theraputic Recreation Progress Note      Alexander [name]  [id number]      Group: Task/Project  Type of Group Interaction: Interaction with Group Leaders/Peers and Followed Directions  Level of Participation: Active, Attended/Number of Minutes: 60/60, Interacted with Group Leaders, Initiated Comments and Followed Directions    Pt was pleasant and social. Requested to work on a Therapist, occupational for his little girl. (Described her as his brothers daughter but he takes care of her.) Neat work with Architect. Pt then chose a Actuary noticing and used the wrong paint so it turned out s/w messy. SWK came in to talk to pt about D/C but pt stated to this writer that he doesn't feel ready 2/2 working on a specific plan with Junious Dresser the Child psychotherapist.     Mateo Flow, CTRS  11/06/2011 4:05 PM

## 2011-11-06 NOTE — Unmapped (Signed)
Patient was able to rest in bed with eyes closed voices no needs or complaints. Staff continues to monitor patient per unit protocol.  Lemoyne Nestor, RN

## 2011-11-06 NOTE — Unmapped (Signed)
Appeared to be having okay shift but expressed concern that he had asked the doctor for an antidepressant but when he asked what the doctor ordered and informed nothing new had been ordered he was noted to become upset.Late in the shift pt expressed need to talk with RN. Pt was very anger and tearful expressing he was upset that doctor ignored his request and his efforts to talk with him.Pt given PRN meds . Visteral 25 mg po  for anxiety.@2225 ,but pt became increasing agitated as he begin  to describe/recall the suicidal events of his two brothers and the death of his father. Pt breaking down in tears and sobbing out of control. Pt given ativan 2mg  po prn  for agitation and ambien 5 po prn for sleep. Pt given reassurance. Q 15 minute checks maintained.                                Marland Kitchen

## 2011-11-07 MED ORDER — risperiDONE (RISPERDAL M-TABS) disintegrating tablet 2 mg
2 | Freq: Every evening | ORAL | Status: AC
Start: 2011-11-07 — End: 2011-11-07

## 2011-11-07 MED ORDER — risperiDONE (RISPERDAL M-TABS) 2 MG disintegrating tablet
2 | Freq: Every evening | ORAL | Status: AC
Start: 2011-11-07 — End: 2013-11-17

## 2011-11-07 MED FILL — RISPERIDONE M-TAB 1 MG DISINTEGRATING TABLET: 1 1 mg | ORAL | Qty: 1

## 2011-11-07 MED FILL — NICOTINE 21 MG/24 HR DAILY TRANSDERMAL PATCH: 21 21 mg/24 hr | TRANSDERMAL | Qty: 1

## 2011-11-07 MED FILL — THIAMINE HCL (VITAMIN B1) 100 MG TABLET: 100 100 MG | ORAL | Qty: 1

## 2011-11-07 MED FILL — THERA 400 MCG TABLET: 400 400 mcg | ORAL | Qty: 1

## 2011-11-07 MED FILL — NICORELIEF 2 MG GUM: 2 2 mg | BUCCAL | Qty: 1

## 2011-11-07 MED FILL — FOLIC ACID 1 MG TABLET: 1 1 MG | ORAL | Qty: 1

## 2011-11-07 NOTE — Unmapped (Signed)
Subsequent PHYSICIAN EVALUTION   Alexander [name]   CC: Suicidal Ideation   Pt doing much better, no problems, slept and ate, getting along, safe d/c.      Context, strong family hx suicide, recent stress, substance use  LocationAMS mood  Severity, had a episode of drug use, says it was to kill self, cocaine and heroin, is unclear which came 1st relapse or sub use, seems relapse 1st.  Doing much better now, future plan go aa  Duration 4 days prior admit  Assoc, low energy, anxiety      HPI: 25 y.o. male brought to [facility] by on statement of belief because he was very depressed, stated that he consumed large amounts of legal and illegal drugs in an attempt to commit suicide.   Upon interview in [facility], pt was quite depressed and tearful. He stated that he took the cocaine, heroin, 15 percocet and alcohol in attemtps to kill himself. Looking back on the attempt he knows that it was stupid but is remorseful that it didn't work. For the sake of hismother and his niece, he stated he is glad it didn't work because they depend on him. AT the time of overdose th ept stated he didn't want to do it but could not stop himself. Prior to the attempt pt filled his refridgerator with his eice's favorotie food and bread and milk., He also went to his mother's favorite deli and bought her her favorite sandwich because he knew what was coming. He said that his mother is disabled and if he bought groceries for her, then she would be set for the rest of the week. Pt's father passed from Brain [place] in 2011 (very quickly), and both of pt's brothers completed suicide in 2008 and 2009 respectively.Pt states that this time of year is very difficult especially during Thanksgiving he was reminded how half his family is gone. Pt also stresses because Christmas is coming up and he has no idea how he will pay for gifts for his little niece.   Pt believes that he is stressed out. He is currently taking care of his mom and niece alone. 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 His income is the sole income of the family and since his father passed, for some reason his mother has been denied Tree surgeon and disability. Currently they are surviving soley on his niece's social security check from the death of her father. Pt states this check is not even enough to pay the rent. Each month he needs to decide which utilities he can pay and which ones he can't. Looking ahead now, he does not know what will happen and fears eviction from their home. Knowing that the attempt was imminent pt has been more pro-active about helping his mother unpack boxes and in the new home, and felt that suicide was the only way out from all his stress. Pt feels that his depression has been of and on, and although he is not currently suicidal, he does not want to exist for himself, but for his mother and niece.   Pt's niece is 21 years old and is the pt's brother's daughter. In 2011 - when the pt was in [place] with the rest of his family, his sister cared for the niece. However, since his sister recently had her own child and is married and since pt's mother wanted to move back to [place], the sister gave up the neice to  the pt and the mother because she and her husband wanted to stay in [place]. Pt underwent substance abuse treatment in [place] for a total of four months and was clean for approx 1 year prior to relapsing recently with all of the stressors. He has been roaming the streets in search of drugs and stated that he ate 15 percocet and took so much heroin, yet still, nothing happened to him.   No A/VH, No manic sx endorses, No IOR, pt has been depressed with decreased sleep, appetite, anhedonia, feelings of hopelessness and worthlessness. No access to firearms     Past Psychiatric History:   Pt was hospitalized in [place] after his father's death. Pt was depressed and had tried to hang himself. That was his first admission and  first attempt. Pt has been diagnosed with Bipolar D/O, Depression and in the past has been on Vistaril, Seroquel 300mg  and Tegretol 150mg , a combo that he said worked well. Pt has not had outpatient treatment since his move back to [place].   Substance Use History:   Pt smokes approx 2ppd and drinks about a 12 pack everyday recently. Pt has not a had history of seizures of DTs. Pt has undergone one substance abuse treatment program in the past in [place] last year. He did a 3 month inpatient program and the 1 month of a 12 month program because he moved to [place] with his mother and niece to help them out. Pt has not used until this past month due to increased stressors. Pt snorts 2gm of heroin and abuses crack cocaine and pain pills. Pt occasionally smokes weed. He has used cocaine and pills for approx 5 years, stayed clean for 1 year. When pt can not find pain pills he uses heroin and vice versa.   Past Medical History:   Past Medical History    Diagnosis  Date    ???  Depression     ???  Drug abuse and dependence       No past surgical history on file.   Social/Developmental History:   Pt was born and raised in [place]. He moved to [place] in 2010-05-11 following father's death because pt's sister lived their and mother and pt wanted to be close to family. After completing about 4 months of substance abuse treatment, pt moved back to [place] with his mother and his niece to help them while they were here. They thought pt's father family (who live in [place]) would help them out, however they denied the family help. Pt currently lives in 2 family home but has no income. 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 Since then pt has been relying on niece's social security check from her father's passing, and must choose which utility to pay each month. He is unsure how long they will be able to  survive with the house. Pt's mother is elderly and disabled, and pt's niece is five years of age. Pt's father died in May 11, 2010 from brain ca - quite quickly. Pt's mother, age 62, was denied disability for her firbromyalgia and her husband's social security widow pension.   Family History:   Pt's Brother Toney Sang completed suicide in 12-May-2007 by overdose, Brother [name] completed suicide in 05/11/2008 by shooting himself.  Mom fibromyalgia   OBJECTIVE:   Vitals:   Filed Vitals:    11/04/11  1191 11/05/11 0824 11/06/11 0802 11/07/11 0745   BP: 135/90 131/85 136/85 159/89   Pulse: 59 103 71 112   Temp: 97.9 ??F (36.6 ??C) 97.6 ??F (36.4 ??C) 97.5 ??F (36.4 ??C) 96.7 ??F (35.9 ??C)   TempSrc: Oral   Oral   Resp: 18 18 18 20    SpO2:           ROS: denies CP/SOB, denies nausea/vomiting, diarrhea/constipation.     Mental Status Exam:   Gen/Behavior: 25 yo CM, doing good, attends groups social  Orientation: alert, oriented to person, place, purpose   Speech: reg rate/rhythm/prosity, nonpressured, fluent   Mood/Affect: better affect approb and normal  Thought Process: logical, goal-oriented, no LOA, no FOA   Thought Content: future oriented with plans go aa and get tx  Attention:Intact   Concentration:Intact - serial 7's   Memory:recent remote and immediate grossly intact   Perceptions: denies AH/VH, does not appear preoccupied with the internal environment, no delusions   Insight/Judgement:better better    Axis I: Bipolar D/O by Hx, depressed type  Substance abuse with cocaine and opiods  Axis YN:WGNFAOZH   Axis YQM:VHQI   Axis ON:GEXBMWUXL abuse, unemployment, financial constraints   Axis V: GAF30 on admit, seems higher today 45 50?    Doing much better dafe to d/c

## 2011-11-07 NOTE — Unmapped (Signed)
Pt spoke with social worker who gave him bus tokens to get home.  Given discharge instructions.  Belongings returned and signed for.  Script given.  Social worker gave pt information on where to catch the correct bus.  Pt escorted off unit by RN @ 1345 without incident.  Safety maintained.

## 2011-11-07 NOTE — Unmapped (Signed)
Visible in halls and dining room.  Social with peers.  Ate well.  Behavior appropriate.  Med compliant.  Nicorette gum given for craving.  Cooperative with staff.  Pt asked to speak with Clinical research associate after lunch.  Appeared much more agitated.  Stated he was angry with doctor for not giving him a different medication and for telling him he was to be discharged today.  Stated he wanted to talk with Child psychotherapist.  Yelling and using profanity.  Writer tried to calm patient but patient cussed at Clinical research associate.  In room talking with financial representative at the moment.  Patient's discharge orders received.  Will continue to monitor for safety.

## 2011-11-07 NOTE — Unmapped (Signed)
D/c summ    Oran Rein   Axis I: Bipolar D/O by Hx, depressed type  Substance abuse with cocaine and opiods  Axis UE:AVWUJWJX   Axis BJY:NWGN   Axis FA:OZHYQMVHQ abuse, unemployment, financial constraints   Axis V: GAF30 on admit, seems higher today 45 50? On d/c 55    D/c meds       ??? folic acid  1 mg Oral Daily 0900   ??? multivitamin  1 tablet Oral Daily 0900   ??? nicotine  1 patch Transdermal Daily 0900   ??? nicotine (polacrilex)  2 mg Oral Once   ??? risperiDONE  2 mg Oral Nightly   ??? sodium chloride  10 mL Intravenous QS   ??? thiamine  100 mg Oral Daily 0900   ??? DISCONTD: risperiDONE  1 mg Oral BID     Reason admit  CC: Suicidal Ideation   HPI: 25 y.o. male brought to [facility] by on statement of belief because he was very depressed, stated that he consumed large amounts of legal and illegal drugs in an attempt to commit suicide.   Upon interview in [facility], pt was quite depressed and tearful. He stated that he took the cocaine, heroin, 15 percocet and alcohol in attemtps to kill himself. Looking back on the attempt he knows that it was stupid but is remorseful that it didn't work. For the sake of hismother and his niece, he stated he is glad it didn't work because they depend on him. AT the time of overdose th ept stated he didn't want to do it but could not stop himself. Prior to the attempt pt filled his refridgerator with his eice's favorotie food and bread and milk., He also went to his mother's favorite deli and bought her her favorite sandwich because he knew what was coming. He said that his mother is disabled and if he bought groceries for her, then she would be set for the rest of the week. Pt's father passed from Brain [place] in 2011 (very quickly), and both of pt's brothers completed suicide in 2008 and 2009 respectively.Pt states that this time of year is very difficult especially during Thanksgiving he was reminded how half his family is gone. Pt also stresses because Christmas is coming up and he has no idea  how he will pay for gifts for his little niece.   Pt believes that he is stressed out. He is currently taking care of his mom and niece alone. He worked for a [employer] and currently is out of work. His income is the sole income of the family and since his father passed, for some reason his mother has been denied Tree surgeon and disability. Currently they are surviving soley on his niece's social security check from the death of her father. Pt states this check is not even enough to pay the rent. Each month he needs to decide which utilities he can pay and which ones he can't. Looking ahead now, he does not know what will happen and fears eviction from their home. Knowing that the attempt was imminent pt has been more pro-active about helping his mother unpack boxes and in the new home, and felt that suicide was the only way out from all his stress. Pt feels that his depression has been of and on, and although he is not currently suicidal, he does not want to exist for himself, but for his mother and niece.   Pt's niece is 30 years old  and is the pt's brother's daughter. In 2010-05-21 - when the pt was in [place] with the rest of his family, his sister cared for the niece. However, since his sister recently had her own child and is married and since pt's mother wanted to move back to [place], the sister gave up the neice to the pt and the mother because she and her husband wanted to stay in [place]. Pt underwent substance abuse treatment in [place] for a total of four months and was clean for approx 1 year prior to relapsing recently with all of the stressors. He has been roaming the streets in search of drugs and stated that he ate 15 percocet and took so much heroin, yet still, nothing happened to him.   No A/VH, No manic sx endorses, No IOR, pt has been depressed with decreased sleep, appetite, anhedonia, feelings of hopelessness and worthlessness. No access to firearms     Context,  strong family hx suicide, recent stress, substance use  Location  [facility] mood  Severity, had a episode of drug use, says it was to kill self, cocaine and heroin, is unclear which came 1st relapse or sub use, seems relapse 1st.  Duration 4 days  Assoc, low energy, anxiety    Past Psychiatric History:   Pt was hospitalized in [place] after his father's death. Pt was depressed and had tried to hang himself. That was his first admission and first attempt. Pt has been diagnosed with Bipolar D/O, Depression and in the past has been on Vistaril, Seroquel 300mg  and Tegretol 150mg , a combo that he said worked well. Pt has not had outpatient treatment since his move back to [place].   Substance Use History:   Pt smokes approx 2ppd and drinks about a 12 pack everyday recently. Pt has not a had history of seizures of DTs. Pt has undergone one substance abuse treatment program in the past in [place] last year. He did a 3 month inpatient program and the 1 month of a 12 month program because he moved to [place] with his mother and niece to help them out. Pt has not used until this past month due to increased stressors. Pt snorts 2gm of heroin and abuses crack cocaine and pain pills. Pt occasionally smokes weed. He has used cocaine and pills for approx 5 years, stayed clean for 1 year. When pt can not find pain pills he uses heroin and vice versa.   Past Medical History:   Past Medical History    Diagnosis  Date    ???  Depression     ???  Drug abuse and dependence       No past surgical history on file.   Social/Developmental History:   Pt was born and raised in [place]. He moved to [place] in 05-21-10 following father's death because pt's sister lived their and mother and pt wanted to be close to family. After completing about 4 months of substance abuse treatment, pt moved back to [place] with his mother and his niece to help them while they were here. They thought pt's father family (who live in [place])  would help them out, however they denied the family help. Pt currently lives in 2 family home but has no income. 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 Since then pt has been relying on niece's social  security check from her father's passing, and must choose which utility to pay each month. He is unsure how long they will be able to survive with the house. Pt's mother is elderly and disabled, and pt's niece is five years of age. Pt's father died in 05-15-2010 from brain ca - quite quickly. Pt's mother, age 21, was denied disability for her firbromyalgia and her husband's social security widow pension.   Family History:   Pt's Brother Toney Sang completed suicide in 05/16/07 by overdose, Brother [name] completed suicide in May 15, 2008 by shooting himself.  Mom fibromyalgia     Allergies:   No Known Allergies   Home Medications:   No current facility-administered medications on file prior to encounter.      No current outpatient prescriptions on file prior to encounter.        OBJECTIVE:   Vitals:   Filed Vitals:    11/04/11 0721 11/05/11 0824 11/06/11 0802 11/07/11 0745   BP: 135/90 131/85 136/85 159/89   Pulse: 59 103 71 112   Temp: 97.9 ??F (36.6 ??C) 97.6 ??F (36.4 ??C) 97.5 ??F (36.4 ??C) 96.7 ??F (35.9 ??C)   TempSrc: Oral   Oral   Resp: 18 18 18 20    SpO2:         Physical Exam:   Gen: no acute distress   HEENT: PERRL EOMI no scleral icterus, throat clear, dentition   CV: reg rate/rhythm, no murmurs or rubs   Resp: clear to auscultation bilaterally no wheezes or crackles   Abd: soft, nontender, nondistended, no rebound or guarding, no hepatosplenomegaly, negative Murphy's, Neg McBurney's   Extr: palpable pulses, no pitting edema   Skin: no obvious rashes or lesions, Tattoo on R forearm   Neuro: CN 2-12 intact, gait normal, neg rhomberg, no noticeable tremor, 5/5 muscle strength in UE/LE   ROS: denies CP/SOB, denies nausea/vomiting,  diarrhea/constipation.   Mental Status Exam:   Gen/Behavior: 25 yo CM, dressed in hospital attire, tearful but cooperative, groomed ok, spontaneous historian   Orientation: alert, oriented to person, place, purpose   Speech: reg rate/rhythm/prosity, nonpressured, fluent   Mood/Affect: Depressed/Blunted   Thought Process: logical, goal-oriented, no LOA, no FOA   Thought Content: +SI, without plan, neg HI   Attention:Intact   Concentration:Intact - serial 7's   Memory:recent remote and immediate grossly intact   Perceptions: denies AH/VH, does not appear preoccupied with the internal environment, no delusions   Insight/Judgement:Fair/limited   Labs:   Lab Results    Component  Value  Date     WBC  6.8  11/03/2011     HGB  14.7  11/03/2011     HCT  42.2  11/03/2011     MCV  87.8  11/03/2011     PLT  227  11/03/2011      Lab Results    Component  Value  Date     GLUCOSE  94  11/03/2011     BUN  20  11/03/2011     CREATININE  0.83  11/03/2011     K  4.7  11/03/2011     ALBUMIN  4.5  11/03/2011      No results found for this basename: TSH, T3TOTAL, T4TOTAL, T3FREE, FREET4, THYROIDAB      No results found for this basename: VITAMINB12      No results found for this basename: ETOHUR      No results found for this basename: COLORU, CLARITYU, PH, PROTEINUA, PHUR, LABSPEC, GLUCOSEU, KEYTONESU, BLOODU,  LEUKOCYTESUR, NITRITE, BILIRUBINUR, UROBILINOGEN, RBCUA, WBCUA, BACTERIA, AMORPHOUS, CRYSTAL, CASTS      Hospital course 25 y.o. male brought to [facility] for suicide attempt   FORMULATION: The patient is a 25 yo CM with a history of   Bipolar D/O who was brought by police from for suicide attempt. The patient made   some verbal threats and ingested 15 percocet with ETOH, cocaine and heroin. He was hopeless with no goals and was an imminent risk to himself. Risk factor analysis at this time reveals risk factors including history of mood disorder, history of suicide attempt, family history of completion of suicide, substance abuse,  not being married , limited support network, increased stressors (financial and social). Protective factors include willing to pursue and participate in treatment, medication compliance, motivation to take care of mother and neice. Risk stratification places this patient at a HIGH risk for   future self-harm or harm to others based on these previously mentioned risk factors.   Safety: Pt requires inpatient hospitalization as the least restrictive means of ensuring safety. He intentionally overdosed on the above substances with plans to commit suicide. He has a family history and prior attempt. Pt also has extensive hx of substance abuse and has limited social support which places him at higher risk than others for suicide. Pt has had so many stressors regarding finances and social issues with mother and niece. Although pt has some future orientation regarding niece and mother, I feel pt is still high risk, especially given the fact that he is very depressed over father's death and has been thinking about his brothers' suicide completion. Pt has been stressed since Thanksgiving and has relapsed and with Christmas approaching pt feels as though he will not be able to go on. Chemical Dependency consult placed, along with CIWA and Opiate w/d protocol. 72 hour hold placed. Medication regimen to be deferred to primary team. Pt will need good outpatient psych follow up at d/c.  He seems to be better today    Will admit, monitor, further eval     Admitted to ward, took meds, attended groups, getting along, he made friends on ward, social with peers, no si/hi/ no a/v halluc, plans go aa and get out pt tx  COndition pt time d/c  Filed Vitals:    11/04/11 0721 11/05/11 0824 11/06/11 0802 11/07/11 0745   BP: 135/90 131/85 136/85 159/89   Pulse: 59 103 71 112   Temp: 97.9 ??F (36.6 ??C) 97.6 ??F (36.4 ??C) 97.5 ??F (36.4 ??C) 96.7 ??F (35.9 ??C)   TempSrc: Oral   Oral   Resp: 18 18 18 20    SpO2:         Mental Status Exam on d/c:    Gen/Behavior: 25 yo CM, doing good, attends groups social   Orientation: alert, oriented to person, place, purpose   Speech: reg rate/rhythm/prosity, nonpressured, fluent   Mood/Affect: better affect approb and normal   Thought Process: logical, goal-oriented, no LOA, no FOA   Thought Content: future oriented with plans go aa and get tx   Attention:Intact   Concentration:Intact - serial 7's   Memory:recent remote and immediate grossly intact   Perceptions: denies AH/VH, does not appear preoccupied with the internal environment, no delusions   Insight/Judgement:better better    Plan will d/c out pt f/u

## 2011-11-07 NOTE — Unmapped (Signed)
As you told our chemical dependency specialist, you are not currently interested in any referral for substance abuse treatment.  Please do consider at least attending 12 Step meetings as there are several meetings right there in [place].  If you elect to start outpatient counseling, please call [facility] at  [phone] as they operate several clinics, one is only a few minutes from you.  The [facility] policy requires that you schedule your own appointment directly

## 2011-11-07 NOTE — Unmapped (Signed)
PT resting peacefully in bed with eyes closed  No C/O  No medication required or requested  Continue to monitor for safety and needs.

## 2011-11-07 NOTE — Unmapped (Signed)
Occupational Therapy/Theraputic Recreation Progress Note      Alexander [name]  [id number]      Group: Other  Type of Group Interaction: Interaction with Group Leaders/Peers and Followed Directions  Level of Participation: Active, Attended/Number of Minutes: 60/60, Interacted with Group Leaders, Remained on Task/Topic/Number of Minutes: 60/60, Initiated Comments and Followed Directions    Pt was attentive to discussion and shared with group appropriately. Pt discussed feeling hopeful about getting financial help for his outpt services and medications, and with his home situation.  He also discussed needing to work with the meds instead of relying on them to make everything better the way some other people do.  Pt is future oriented and goal directed, hoping for D/C soon.    [name] N Ric Rosenberg  11/07/2011 11:34 AM

## 2011-11-08 NOTE — Unmapped (Signed)
This patient was seen by the resident physician.  I have seen and examined the patient, agree with the workup, evaluation, management and diagnosis. Care plan has been discussed and I concur.  I have reviewed the ECG and concur.        Newton Pigg, MD  11/08/11 1920

## 2012-01-13 ENCOUNTER — Inpatient Hospital Stay: Admit: 2012-01-13 | Discharge: 2012-01-14 | Disposition: A | Discharge status: Home / Self Care | Payer: MEDICAID

## 2012-01-13 ENCOUNTER — Emergency Department: Payer: MEDICAID

## 2012-01-13 ENCOUNTER — Emergency Department: Admit: 2012-01-14 | Payer: MEDICAID

## 2012-01-13 DIAGNOSIS — M549 Dorsalgia, unspecified: Secondary | ICD-10-CM

## 2012-01-13 MED ORDER — morphine 5 mg/mL injection 5 mg
5 | Freq: Once | INTRAMUSCULAR | Status: AC
Start: 2012-01-13 — End: 2012-01-13
  Administered 2012-01-14: via INTRAVENOUS

## 2012-01-13 MED ORDER — OMNIPAQUE (iohexol) 240 mg iodine/mL 50 mL
240 | Freq: Once | INTRAVENOUS | Status: AC | PRN
Start: 2012-01-13 — End: 2012-01-14
  Administered 2012-01-14: 05:00:00 via ORAL

## 2012-01-13 MED ORDER — morphine 5 mg/mL injection
5 | INTRAMUSCULAR | Status: AC
Start: 2012-01-13 — End: 2012-01-13
  Administered 2012-01-14: 05:00:00 via INTRAVENOUS

## 2012-01-13 MED ORDER — morphine 5 mg/mL injection 5 mg
5 | Freq: Once | INTRAMUSCULAR | Status: AC
Start: 2012-01-13 — End: 2012-01-13
  Administered 2012-01-14: 05:00:00 via INTRAVENOUS

## 2012-01-13 MED ORDER — morphine 5 mg/mL injection 5 mg
5 | Freq: Once | INTRAMUSCULAR | Status: AC
Start: 2012-01-13 — End: 2012-01-13
  Administered 2012-01-14: 03:00:00 via INTRAVENOUS

## 2012-01-13 MED FILL — MORPHINE 5 MG/ML INJECTION SOLUTION: 5 5 mg/mL | INTRAMUSCULAR | Qty: 1

## 2012-01-13 MED FILL — OMNIPAQUE 240 MG IODINE/ML INTRAVENOUS SOLUTION: 240 240 mg iodine/mL | INTRAVENOUS | Qty: 500

## 2012-01-13 NOTE — Unmapped (Signed)
ED Note    Reason for Visit: Polyuria, Testicle Pain and Back Pain      Patient History     HPI Comments: The patient reports that for the 2 weeks he has been urinating a lot; however, for the last couple days he has had increased urgency and increase urination.  The patient also reports a concomitant sharp back and abdominal pains that are relieved by urination.  He reports decreasing his fluid intake and still having abnormal frequency of urination.  The patient does not report any numbness with his back pain and he does not report radiating symptoms.  The pain is isolated to the right flank during urination and shortly before. The patient also complains of some right inguinal and testicular pain.  Denies fever, chills, nausea, vomiting, or diarrhea.  Reports he no longer drinks alcohol.    The history is provided by the patient and a friend.          Past Medical History   Diagnosis Date   ??? Depression    ??? Drug abuse and dependence        History reviewed. No pertinent past surgical history.    Patient  reports that he has been smoking Cigarettes.  He has been smoking about 2 packs per day. He does not have any smokeless tobacco history on file. He reports that he drinks about 1.8 ounces of alcohol per week. He reports that he uses illicit drugs (Cocaine, Heroin, Other-see comments, and Marijuana).      Previous Medications    RISPERIDONE (RISPERDAL M-TABS) 2 MG DISINTEGRATING TABLET    Take 1 tablet (2 mg total) by mouth at bedtime.       Allergies:   Allergies as of 01/13/2012   ??? (No Known Allergies)       Review of Systems     Review of Systems   Constitutional: Negative for fever, chills, activity change, fatigue and unexpected weight change.   HENT: Negative for congestion, sore throat, drooling, mouth sores, trouble swallowing, neck pain, neck stiffness and voice change.    Eyes: Negative for photophobia, pain and visual disturbance.      Respiratory: Negative for cough, chest tightness, shortness of breath and wheezing.    Cardiovascular: Negative for chest pain, palpitations and leg swelling.   Gastrointestinal: Positive for abdominal pain. Negative for nausea, vomiting, diarrhea, constipation, blood in stool, abdominal distention and anal bleeding.   Genitourinary: Positive for urgency, frequency, flank pain and testicular pain. Negative for dysuria, hematuria, decreased urine volume, discharge, penile swelling, scrotal swelling, enuresis, difficulty urinating, genital sores and penile pain.   Musculoskeletal: Positive for myalgias and back pain. Negative for joint swelling, arthralgias and gait problem.   Skin: Negative for color change.   Neurological: Negative for dizziness, tremors, seizures, syncope, weakness, light-headedness and headaches.   Hematological: Negative for adenopathy.   Psychiatric/Behavioral: Negative for suicidal ideas, confusion, disturbed wake/sleep cycle, self-injury and agitation. The patient is not nervous/anxious.            Physical Exam     ED Triage Vitals   Vital Signs Group      Temp 01/13/12 1702 98 ??F (36.7 ??C)      Temp Source 01/13/12 1702 Oral      Heart Rate 01/13/12 1702 72       Heart Rate Source 01/13/12 1702 Automatic      Resp 01/13/12 1702 16       BP 01/13/12 1702 159/91  mmHg      BP Location 01/13/12 1702 Right arm      BP Method 01/13/12 1702 Automatic      Patient Position 01/13/12 1702 Sitting   SpO2 01/13/12 1702 100 %   O2 Device 01/13/12 1702 None (Room air)       Physical Exam   Nursing note and vitals reviewed.  Constitutional: He is oriented to person, place, and time. He appears well-developed and well-nourished.   HENT:   Head: Normocephalic and atraumatic.   Eyes: Pupils are equal, round, and reactive to light. No scleral icterus. Right eye exhibits nystagmus. Left eye exhibits nystagmus.   Neck: Normal range of motion. Neck supple.   Cardiovascular: Normal rate and regular rhythm.     Pulmonary/Chest: Effort normal and breath sounds normal.   Abdominal: Soft. There is tenderness. There is guarding. There is no rebound.   Musculoskeletal: Normal range of motion. He exhibits tenderness. He exhibits no edema.        Tenderness over the Right kidney and right flank to palpation.   Neurological: He is alert and oriented to person, place, and time.   Skin: Skin is warm and dry.   Psychiatric: He has a normal mood and affect. His behavior is normal.         Diagnostic Studies     Labs:    Please see electronic medical record for any tests performed in the ED    Radiology:    Please see electronic medical record for any tests performed in the ED    EKG:    No EKG Performed    Emergency Department Procedures     Procedures    ED Course and MDM     Keylen Eckenrode is a 26 y.o. male who presented to the emergency department with Polyuria, Testicle Pain and Back Pain       The patient was evaluated and found to be hemodynamically and neurologically intact.  He had a full set of labs drawn and was medicated with 5mg  of IV morphine which improved his pain.  A urinalysis was performed as well.  The patient labs came back and demonstrated elevated total protein and albumin.  The urinalysis was grossly normal with no blood present.  At this time the BMP and CBC have not been resulted.  I will be signing over care to the incoming treatment team who will continue with the medical decision making.  He will be getting a CT abdomen; however, we are waiting at this time for the bmp to decide if there can be IV contrast.      Report given to [facility].       Critical Care Time (Attendings)           Charm Rings, MD  01/13/12 2002

## 2012-01-13 NOTE — Unmapped (Signed)
Urinary frequency x 1 week.  Low back pain (right sided) Testicular pain x 2 days.

## 2012-01-14 LAB — BASIC METABOLIC PANEL
1/Creatinine: 1.02 ratio
Anion Gap: 18 mmol/L (ref 3–16)
BUN: 11 mg/dL (ref 7–25)
CO2: 28 mmol/L (ref 21–33)
Calcium: 9.6 mg/dL (ref 8.6–10.2)
Chloride: 100 mmol/L (ref 98–110)
Creatinine: 0.98 mg/dL (ref 0.50–1.30)
GFR MDRD Af Amer: 115 See note.
GFR MDRD Non Af Amer: 95 See note.
Glucose: 82 mg/dL (ref 65–99)
Potassium: 4 mmol/L (ref 3.5–5.3)
Sodium: 146 mmol/L (ref 135–146)

## 2012-01-14 LAB — URINALYSIS-MACROSCOPIC W/REFLEX TO MICROSCOPIC
Bilirubin, UA: NEGATIVE
Blood, UA: NEGATIVE
Glucose, UA: NEGATIVE mg/dL
Ketones, UA: NEGATIVE mg/dL
Leukocytes, UA: NEGATIVE
Nitrite, UA: NEGATIVE
Protein, UA: NEGATIVE mg/dL
Specific Gravity, UA: 1.015 (ref 1.005–1.035)
Urobilinogen, UA: 0.2 EU/dL (ref 0.2–1.0)
pH, UA: 6 (ref 5.0–8.0)

## 2012-01-14 LAB — CBC
Hematocrit: 49.6 % (ref 38.5–50.0)
Hemoglobin: 17.2 g/dL (ref 13.2–17.1)
MCH: 31 pg (ref 27.0–33.0)
MCHC: 34.6 g/dL (ref 32.0–36.0)
MCV: 89.5 fL (ref 80.0–100.0)
MPV: 9.9 fL (ref 7.5–11.5)
Platelet Estimate: ADEQUATE
Platelets: 269 10*3/uL (ref 140–400)
RBC: 5.55 10*6/uL (ref 4.20–5.80)
RDW: 13.7 % (ref 11.0–15.0)
WBC: 9.3 10*3/uL (ref 3.8–10.8)

## 2012-01-14 LAB — DIFFERENTIAL
Atypical Lymphocytes Relative: 4 % (ref 0.0–9.0)
Bands Absolute: 372 /uL (ref 0–750)
Bands Relative: 4 % (ref 0.0–9.0)
Eosinophils Absolute: 186 /uL (ref 15–500)
Eosinophils Relative: 2 % (ref 0.0–8.0)
Lymphocytes Absolute: 2511 /uL (ref 850–3900)
Lymphocytes Relative: 27 % (ref 15.0–45.0)
Monocytes Absolute: 93 /uL — ABNORMAL LOW (ref 200–950)
Monocytes Relative: 1 % (ref 0.0–12.0)
Neutrophils Absolute: 5766 /uL (ref 1500–7800)
Neutrophils Relative: 62 % (ref 40.0–80.0)

## 2012-01-14 LAB — AMYLASE: Amylase: 49 U/L (ref 21–101)

## 2012-01-14 LAB — HEPATIC FUNCTION PANEL
ALT: 54 U/L (ref 9–60)
AST: 40 U/L (ref 17–59)
Albumin: 5.8 g/dL — ABNORMAL HIGH (ref 3.6–5.1)
Alkaline Phosphatase: 107 U/L (ref 40–115)
Bilirubin, Direct: 0 mg/dL (ref 0.0–0.2)
Total Bilirubin: 0.6 mg/dL (ref 0.2–1.2)
Total Protein: 9.7 g/dL — ABNORMAL HIGH (ref 6.2–8.3)

## 2012-01-14 LAB — LIPASE: Lipase: 98 U/L (ref 23–300)

## 2012-01-14 MED ORDER — cyclobenzaprine (FLEXERIL) 10 MG tablet
10 | ORAL_TABLET | Freq: Three times a day (TID) | ORAL | Status: AC | PRN
Start: 2012-01-14 — End: 2012-12-20

## 2012-01-14 MED ORDER — OMNIPAQUE (iohexol) 350 mg iodine/mL 150 mL
350 | Freq: Once | INTRAVENOUS | Status: AC | PRN
Start: 2012-01-14 — End: 2012-01-14
  Administered 2012-01-14: 05:00:00 via INTRAVENOUS

## 2012-01-14 MED ORDER — naproxen (NAPROSYN) 500 MG tablet
500 | ORAL_TABLET | Freq: Two times a day (BID) | ORAL | Status: AC | PRN
Start: 2012-01-14 — End: 2012-12-20

## 2012-01-14 MED FILL — OMNIPAQUE 350 MG IODINE/ML INTRAVENOUS SOLUTION: 350 350 mg iodine/mL | INTRAVENOUS | Qty: 200

## 2012-01-14 NOTE — Unmapped (Signed)
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 I resumed care of this patient from Dr. Alphia Moh, MD; please see their dictation for complete history and physical exam. At time of patient turnover, the patient was pending the results of his lab work and CT scan.     Blood pressure 159/91, pulse 72, temperature 98 ??F (36.7 ??C), temperature source Oral, resp. rate 16, height 5' 9 (1.753 m), weight 161 lb (73.029 kg), SpO2 100.00%.    RESULTS:  Labs Reviewed   BASIC METABOLIC PANEL - Abnormal; Notable for the following:     Anion Gap 18 (*)      All other components within normal limits   CBC - Abnormal; Notable for the following:     Hemoglobin 17.2 (*)      All other components within normal limits    Narrative:     Peripheral blood smear was scanned per review criteria approved by the laboratory medical director.   DIFFERENTIAL - Abnormal; Notable for the following:     Monocytes Absolute 93 (*)      All other components within normal limits    Narrative:     Manual WBC differential performed per review criteria approved by the medical director.RBC morphology appears normal.   HEPATIC FUNCTION PANEL - Abnormal; Notable for the following:     Total Protein 9.7 (*)      Albumin 5.8 (*)      All other components within normal limits   URINALYSIS-MACROSCOPIC   LIPASE   AMYLASE     CT ABDOMEN/PELVIS WITH CONTRAST PERFORMED January 13, 2012    INDICATION: Lower abdominal pain    COMPARISON: None    TECHNIQUE: Helically acquired axial CT images were obtained from the lung bases through the pelvis following the administration of oral and 150 mL of Omnipaque 350 intravenous contrast and reconstructed to a slice thickness of 5 mm with a field-of-view   of 6 cm    FINDINGS:    Limited evaluation of the lower chest reveals clear lung bases. The abdominal and pelvic organs are normal in appearance. The visualized vascular structures are patent and there is no evidence of focal fluid collection. The appendix is normal in size  and  configuration. The rest of the bowel is unremarkable without evidence of thickening or obstruction. No free fluid. No abnormal lymphadenopathy or suspicious osseous lesions are identified.    IMPRESSION:    Negative abdominal/pelvic CT.    I have personally reviewed the images and I agree with this report.    Report Verified by: Kandice Moos, [name] at 01/14/2012 12:24 AM    ED Course and Medical-Decision-Making     Alexander [name] is admitted to the Emergency Department for evaluation of his chief complaint as described in the history of present illness.  Complete history and physical was performed by me and my attending. Nursing notes, past medical history, surgical history, family history and social history were reviewed and addressed in the HPI.    [name] is a well-appearing, hemodynamically stable within normal limits 26 y.o. male who presents to the emergency department with a complaint of back pain, abdominal pain, testicular pain and frequent urination.  Initial workup on this patient included urinalysis, CBC, BMP, lipase, liver function testing and CT scan of the abdomen and pelvis with IV and by mouth contrast.  The patient today did demonstrate back and groin discomfort and was given several doses of morphine for analgesia during his stay in the emergency department.  His urinalysis grossly normal.  Amylase was also normal.  Patient demonstrated elevated protein and albumin along with elevated hemoglobin level suggestive of mild dehydration and hemoconcentration.  His anion gap slightly elevated demonstrates no electrolyte abnormalities or signs and symptoms last dose.  Otherwise, his CBC and basic metabolic panel were also normal.  Additionally his CT scan demonstrated no abdominal or pelvic abnormalities.  Patient demonstrates no signs and symptoms of pyelonephritis, urinary tract infection, hernia, and epididymitis, testicular torsion, orchitis or appendicitis.  Additionally the patient  does not have any diagnostic or physical exam indicators of diabetes or diabetes insipidus.  In further discussion with the patient, his job requires a fair amount of lifting.  Further physical examination demonstrates pain is somewhat consistent with back and groin strain.  Therefore given the findings of physical examination and diagnostics feel the most appropriate course of management for this patient would be to give him on a trial of scheduled NSAIDs and muscle relaxants.  Will prescribe him naproxen twice a day and Flexeril 3 times a day.  Patient will monitor his symptoms and pain and follow up with his primary care physician in 3-4 days should his signs and symptoms not demonstrate improvement.  I discussed the plan at length with the patient verbalizes understanding is in agreement.  The patient is currently stable and will be discharged home for continued self-care.    Impression     1. Back pain    2. Groin pain      Plan     1) The patient is discharged to home in good condition.  2) The patient will followup with his primary care physician in 3-4 days should his symptoms not demonstrate improvement.  3) The following medications were prescribed for the patient during this visit:   New Prescriptions    CYCLOBENZAPRINE (FLEXERIL) 10 MG TABLET    Take 1 tablet (10 mg total) by mouth 3 times a day as needed for Muscle spasms.    NAPROXEN (NAPROSYN) 500 MG TABLET    Take 1 tablet (500 mg total) by mouth 2 times a day as needed (for pain).     4) The following patient home medications were modified during this visit:   Modified Medications    No medications on file     5) The patient will continue to take the following home medications as prescribed by their physician:   Previous Medications    RISPERIDONE (RISPERDAL M-TABS) 2 MG DISINTEGRATING TABLET    Take 1 tablet (2 mg total) by mouth at bedtime.     6) The patient is instructed to return to the emergency department should his symptoms worsen or any  concern he believes warrants acute physician evaluation.    The patient and/or the family were informed of the results of any tests, a time was given to answer questions, a plan was proposed and they agreed with plan.     Patient was seen and evaluated by the attending physician, Serena Croissant, M.D., who agrees with my assessment, treatment and plan.    (Please note the MDM and HPI sections of this note were completed with voice recognition software)            Caroll Rancher, [place]  01/14/12 [phone]

## 2012-02-01 NOTE — Unmapped (Signed)
The patient was seen and examined with the resident physician.  I have reviewed the notes, assessments, plan and/or procedures.  The case was discussed with the resident physician and I agree with their documentation.    Assessment reveals abdomen is soft nontender without rebound.

## 2012-07-19 ENCOUNTER — Inpatient Hospital Stay: Payer: Self-pay | Admitting: Psychiatry

## 2012-07-19 LAB — COMPREHENSIVE METABOLIC PANEL
Albumin: 4.3 g/dL (ref 3.4–5.0)
Alkaline Phosphatase: 146 U/L — ABNORMAL HIGH (ref 50–136)
Calcium, Total: 9.2 mg/dL (ref 8.5–10.1)
Co2: 29 mmol/L (ref 21–32)
EGFR (African American): >60
EGFR (Non-African Amer.): >60
Glucose: 81 mg/dL (ref 65–99)
Osmolality: 275 (ref 275–301)
Potassium: 4 mmol/L (ref 3.5–5.1)
SGOT(AST): 41 U/L — ABNORMAL HIGH (ref 15–37)
SGPT (ALT): 237 U/L — ABNORMAL HIGH (ref 12–78)
Sodium: 138 mmol/L (ref 136–145)

## 2012-07-19 LAB — DRUG SCREEN, URINE
Barbiturates, Ur Screen: NEGATIVE (ref ?–200)
Cannabinoid 50 Ng, Ur ~~LOC~~: POSITIVE (ref ?–50)
Cocaine Metabolite,Ur ~~LOC~~: NEGATIVE (ref ?–300)
MDMA (Ecstasy)Ur Screen: NEGATIVE (ref ?–500)
Phencyclidine (PCP) Ur S: NEGATIVE (ref ?–25)

## 2012-07-19 LAB — CBC
MCHC: 35.1 g/dL (ref 32.0–36.0)
MCV: 88 fL (ref 80–100)
Platelet: 198 10*3/uL (ref 150–440)
RBC: 5 10*6/uL (ref 4.40–5.90)
RDW: 12.5 % (ref 11.5–14.5)
WBC: 9 10*3/uL (ref 3.8–10.6)

## 2012-07-19 LAB — TSH: Thyroid Stimulating Horm: 0.77 u[IU]/mL

## 2012-07-19 LAB — SALICYLATE LEVEL: Salicylates, Serum: <1.7 mg/dL

## 2012-07-19 LAB — ETHANOL
Ethanol %: <0.003 % (ref 0.000–0.080)
Ethanol: <3 mg/dL

## 2012-12-20 ENCOUNTER — Inpatient Hospital Stay: Admit: 2012-12-20 | Discharge: 2012-12-20 | Disposition: A | Discharge status: Home / Self Care | Payer: MEDICAID

## 2012-12-20 DIAGNOSIS — K047 Periapical abscess without sinus: Secondary | ICD-10-CM

## 2012-12-20 MED ORDER — penicillin v potassium (VEETID) 500 MG tablet
500 | ORAL_TABLET | Freq: Four times a day (QID) | ORAL | Status: AC
Start: 2012-12-20 — End: 2012-12-27

## 2012-12-20 MED ORDER — traMADol (ULTRAM) 50 mg tablet
50 | ORAL_TABLET | Freq: Four times a day (QID) | ORAL | Status: AC | PRN
Start: 2012-12-20 — End: 2013-07-26

## 2012-12-20 NOTE — Unmapped (Signed)
[facility] ED Note    Date of service:  12/20/2012    Reason for Visit: Abscess and Dental Pain      Patient History     HPI  Alexander [name] is a 27 year old Caucasian male with left-sided facial swelling related to painful, tender left maxillary dentition.  He describes no problems with fever, chills, cough, sputum production.  His facial swelling was about 24 hours ago.  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  He has no troubles with phonation, swallowing, trismus.  He has no dentist or the primary care provider.  He has no mandibular findings.     Past Medical History   Diagnosis Date   ??? Depression    ??? Drug abuse and dependence        History reviewed. No pertinent past surgical history.    Patient  reports that he has been smoking Cigarettes.  He has been smoking about 2.00 packs per day. He does not have any smokeless tobacco history on file. He reports that he drinks about 1.8 ounces of alcohol per week. He reports that he uses illicit drugs (Cocaine, Heroin, Other-see comments, and Marijuana).      Previous Medications    IBUPROFEN (ADVIL,MOTRIN) 200 MG TABLET    Take 200 mg by mouth every 6 hours as needed for Pain.    RISPERIDONE (RISPERDAL M-TABS) 2 MG DISINTEGRATING TABLET    Take 1 tablet (2 mg total) by mouth at bedtime.       Allergies:   Allergies as of 12/20/2012   ??? (No Known Allergies)       Review of Systems     Review of Systems    Constitutional:  Denies fever or chills   Eyes:  Denies double vision or blurry vision   HENT:  Denies nasal congestion or sore throat   Respiratory:  Denies cough or shortness of breath   Cardiovascular:  Denies chest pain or edema   GI:  Denies abdominal pain, nausea, vomiting, or diarrhea   GU:  Denies dysuria   Musculoskeletal:  Denies back pain or joint pain   Integument:  Denies rash   Neurologic:  Denies focal weakness or seizures   Endocrine:  Denies polyuria or polydipsia      Lymphatic:  Denies swollen glands   Psychiatric:  Denies depression         Physical Exam     ED Triage Vitals   Vital Signs Group      Temp 12/20/12 0739 97.6 ??F (36.4 ??C)      Temp Source 12/20/12 0739 Oral      Heart Rate 12/20/12 0739 77      Heart Rate Source 12/20/12 0739 Automatic      Resp 12/20/12 0739 18      BP 12/20/12 0739 165/87 mmHg      BP Location 12/20/12 0739 Left arm      BP Method 12/20/12 0739 Automatic      Patient Position 12/20/12 0739 Sitting   SpO2 12/20/12 0739 100 %   O2 Device 12/20/12 0739 None (Room air)       Physical Exam    General- Well developed in no acute distress  HEENT- NC/AT, PERRLA, swelling is noted to the left malar region.  His left nasolabial fold is somewhat flattened.  He has many pathologically fractured teeth however the left maxillary bicuspid appears to be most affected.  No trismus  Neck-Supple, normal ROM, the floor  mouth is soft.  The neck is freely mobile.  Chest-CTA  Cardiac- RRR  Abdomen-Soft NT/ND  Musculoskeletal-FROM in all 4 extremities  Neuro- 5/5 strength upper/lower extremities, normal gait  Skin-No rashes  Psychiatric- alert and oriented, no SI      Diagnostic Studies     Labs:    No tests were performed during this ED visit    Radiology:    No tests were performed during this ED visit    EKG:    No EKG Performed    Emergency Department Procedures     Procedures    ED Course and MDM     Alexander [name] is a 27 y.o. male who presented to the emergency department with Abscess and Dental Pain      Alexander [name] is a 27 year old male with a left-sided canine space abscess.  He'll be given penicillin, Ultram, followup with oral and maxillofacial surgical clinic at 7:30 tomorrow morning in the [facility] 2nd floor oral surgical clinic.       Critical Care Time (Attendings)           Theodore Demark, MD  12/20/12 [phone]

## 2012-12-20 NOTE — Unmapped (Signed)
Patient complains of an abscess to (L) upper jaw since yesterday morning. Reports not having a dentist to follow up with.

## 2013-01-30 ENCOUNTER — Emergency Department: Payer: Self-pay | Admitting: Emergency Medicine

## 2013-02-17 ENCOUNTER — Inpatient Hospital Stay: Admit: 2013-02-17 | Discharge: 2013-02-18 | Disposition: A | Discharge status: Home / Self Care

## 2013-02-17 DIAGNOSIS — F1994 Other psychoactive substance use, unspecified with psychoactive substance-induced mood disorder: Secondary | ICD-10-CM

## 2013-02-17 LAB — URINE DRUG SCREEN WITHOUT CONFIRMATION, STAT
Amphetamines UR, 1000 ng/mL Cutoff: NEGATIVE
Barbiturates UR, 300  ng/mL Cutoff: NEGATIVE
Benzodiazepines UR, 300 ng/mL Cutoff: POSITIVE
Cocaine UR, 300 ng/mL Cutoff: NEGATIVE
MDMA URINE: NEGATIVE
Methadone, UR, 300 ng/mL Cutoff: NEGATIVE
Methamph, UR, 1000 ng/mL Cutoff: NEGATIVE
Opiates UR, 300 ng/mL Cutoff: NEGATIVE
Oxycodone UR: POSITIVE
Phencyclidine (PCP) UR, 25 ng/mL Cutoff: NEGATIVE
THC UR, 50 ng/mL Cutoff: POSITIVE
Tricyclic Antidepressants Screen,Urine 1000 ng/mL Cutoff: NEGATIVE

## 2013-02-17 MED ORDER — ibuprofen (ADVIL,MOTRIN) tablet 800 mg
800 | Freq: Once | ORAL | Status: AC
Start: 2013-02-17 — End: 2013-02-17
  Administered 2013-02-17: 20:00:00 via ORAL

## 2013-02-17 MED ORDER — ibuprofen (ADVIL,MOTRIN) tablet 600 mg
600 | Freq: Once | ORAL | Status: AC
Start: 2013-02-17 — End: 2013-02-17
  Administered 2013-02-18: 01:00:00 via ORAL

## 2013-02-17 MED ORDER — nicotine (NICODERM CQ) 21 mg/24 hr 1 patch
21 | Freq: Every day | TRANSDERMAL | Status: AC
Start: 2013-02-17 — End: 2013-02-18
  Administered 2013-02-17: 20:00:00 via TRANSDERMAL

## 2013-02-17 MED FILL — NICOTINE 21 MG/24 HR DAILY TRANSDERMAL PATCH: 21 21 mg/24 hr | TRANSDERMAL | Qty: 1

## 2013-02-17 MED FILL — IBUPROFEN 600 MG TABLET: 600 600 MG | ORAL | Qty: 1

## 2013-02-17 MED FILL — IBUPROFEN 800 MG TABLET: 800 800 MG | ORAL | Qty: 1

## 2013-02-17 NOTE — Unmapped (Signed)
Agree with the resident's note and discussed case with the resident doctor.    Jniya Madara, MD  02/18/13 1500

## 2013-02-17 NOTE — Unmapped (Signed)
Pt given Ibuprofen 600 mg PO for complaints of headache #5.  Pt reports that the medication he had earlier (Ibuprofen 800) did not help the side and chest pain at all.  Pt states when he leaves here, he is going to the hospital for his chest and side.

## 2013-02-17 NOTE — Unmapped (Signed)
Patient complained of chest pain to left side of chest where patient stated that he has been diagnosed with a cyst and left arm pain from a recent fracture that he did not leave splint on and let heal properly.  Patient reports pain of 7/10 in both areas.  Doctor Murrell Converse made aware with no new orders at present time.
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
## 2013-02-17 NOTE — Unmapped (Signed)
Urine poc tox obtained and + for THC, BZO, OXY.

## 2013-02-17 NOTE — Unmapped (Signed)
Patient questioned about + BZO and OXY.  He reports getting a Percocet and Valium on the street.
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
## 2013-02-17 NOTE — Unmapped (Signed)
SUICIDE PREVENTION    At some point in our lives, people may think about suicide.  People in a crisis may think their situation is hopeless and lose control.  Most decide not to commit suicide because they are able to see there is something to live for beyond their current situation.  On the other hand, others do not see a reason to live and actively plan and carry out suicide.  In order to prevent a suicide it is very important to recognize when a person is at risk for killing himself/herself.    Warning Signs      Discharge Instructions Following an Episode of Suicide Threats or Actions        Severe depression with restlessness   Remove all firearms, weapons (of any kind) or any unneeded medicines.  Inability to sleep or sleeping too much   Accompany the person to their follow-up mental health appointments.   Withdrawing from friends, family and life   Ask your loved one to sign a release that allows you to contact their mental health         provider.  Increase alcohol and/or drug use    Talk openly and direct about suicidal thoughts.  Giving away personal items    Listen.  Allow expressions of feelings.  Anxiety, agitation, rage, or anger    Block all inappropriate internet websites and social media.   Unpredictable mood swings    Offer to be available to them at any time.  Feeling like there is no way out    Remain available to accompany them to the nearest emergency department.  Hopelessness      Call other family members or friends to help and offer support.   Constantly talking or writing about death   Get help from agencies that specialize in crisis intervention.  Seeking out information about suicide   Using the information here and other community resources create a suicide safety         plan.          Example:   Call support networks, crisis lines, 911, go to emergency          room, call Mobile Crisis Team, call mental health provider      What Family Members & Friends Should Know               Suicidal ideas are usually associated with treatable conditions   People who try or actually commit suicide try to let someone know by leaving a note  All suicide threats should be taken seriously      Suicidal ideas occur when people are depressed, intoxicated or irrational  Suicidal thinking can consume a person   Suicide risk increases when a person who has been severely depressed suddenly has more energy    Most common ways of suicide - pills, guns, poisons, hanging, breathing carbon monoxide,  jumping off high places, and accidents       Suicide Prevention Resources   [place]     Hotline                                                       281-CARE (2273)     Psychiatric Emergency Services                     513-584-8577     Mobile Crisis Team                                      [phone]     [place]     [place] Consultation & Crisis               [phone]   (Mobile Crisis Response Team)    [place]     Emergency Crisis Hotline                         513.528.SAVE (7283)     [place]      [place] Emergency Crisis Line               859.331.3292     National      [place] Hotline                                     1.800.273.TALK (8255)       How Can I Help When Someone is Threatening Suicide    Take Action!    Take his/her words seriously and respond with compassion.     Do not leave them alone. Call 911 and have the person taken to an emergency room.   Accompany the person to the emergency room and provide the physician with information.                             SUBSTANCE ABUSE COMMUNITY RESOURCES   Education, Advocacy and Referrals      Alcoholism Council    [phone]      [place] Alcohol &                 Drug Addiction (ADAS)                      [phone]      [facility] ([facility]) [phone]      [facility]     [phone]   Detoxification Services / Inpatient Treatment      [facility]                                         [phone]       (Center for Chemical Addictions Treatment)       [facility] Alcohol/                            [phone]              Drug Treatment, [place], [place]      [facility] ([place])   [phone]      [facility] ([facility])    [phone]   Outpatient Treatment Services        [facility]         [address] ([place])    [phone]       [address] ([place])  [phone]     [facility]                                                               (  Older Adults Seeking Sobriety)               [phone]     [facility] ([facility]) [phone]     [facility] ([facility]) [phone]    [facility]   [phone]    [facility]    [phone]    [facility]  [phone]    [place] [facility]             [phone]            ([place])                                          Option 0    [facility]    [phone]    [facility] ([place]) [phone]    [facility]     [phone]    [facility]                                                  [phone]           (Opiate Addiction Recovery Services)    [facility]                            [phone]     [facility]    [phone]        SAMI referrals to filter through [facility]AP             [phone]    [facility]                           [phone]         Chemical Dependency Clinic                        [phone]     Men???s Treatment Services    [facility]                       [phone]               (6 month residential program)    [facility]    [phone]    [facility]    [phone]    [facility]  [phone]    [facility] ([place])  [phone]    [facility]    [phone]    [facility]    [phone]    [facility] (Call CAP  Line)  [phone]     [facility]    [phone]    [facility] [phone]                                                                             option 2    [facility] [place])                           [phone]            (residential for males between 18-35 years    Women???s Treatment Services    [facility] )                          [phone]        (shelter only, support services, no treatment)    [facility]     [phone]    [facility]  [phone]    [facility]    [phone]    [facility]               [phone]          ([facility])    [facility]                            [phone]              (must be [place] resident)   Support Groups for Alcohol and Chemical Dependency     [facility] Intergroup                          [phone]           http://www.aac[place].org/meetings.html     Al-Anon   www.cincinnatiafg.org              [phone]           (for family members of substance abusers)     Narcotics Anonymous                                    [phone]         http://portaltools.na.org/portaltools/MeetingLoc      [facility]  http://www.rrci.net    [facility]  http://www.smartrecovery.org/ MENTAL HEALTH COMMUNITY RESOURCES   Case Management Agencies     [facility] ([facility]) [phone]                [facility]              [phone]                Drug Services Intake              [phone]     [facility]                                     [phone]             (formerly [facility])     [facility]  [phone]               Health Services               Case Management              [phone]               Community Living Services              [phone]     [facility]  Adult Intake   [phone]                             Child & Family Intake  [phone]         SAMI (Substance  Abuse and Mental Illness)         SAMI Referrals filter through MHAP          [phone]                 Other Community Mental Health Services (Counseling)                               (accepts Medicaid)      [facility]                               [phone]     [facility]                         [phone]     [facility]                                      [phone]     [facility]   [phone]     [facility]                     [phone]     [facility]                                        [phone]            Support Services     [facility] ([facility])  [phone]     [facility] Recovery                   [phone]                  Services Board     [facility] [place]  [phone]     281-CARE (Suicide & Crisis Hot Line) [phone]     931-WARM, [facility]                         [phone]                (service for existing customers)     Family LinkLine, [facility]  [phone]                 (resource listing)     [employer] Way Helpline    211       Shelters Men     [facility] [phone]     [facility]     (Call CAP line)  [phone]     [facility]  [phone]        Shelters Women       [facility]    [phone]     [facility] (call CAP line)  [phone]     Shelters Men & Women     [facility]    [phone]     Inexpensive Housing Options          [facility]    [phone]       [facility]    [phone]     [facility] (women only)   [phone]

## 2013-04-18 ENCOUNTER — Inpatient Hospital Stay: Admit: 2013-04-18 | Discharge: 2013-04-18 | Attending: Emergency Medicine

## 2013-04-18 NOTE — ED Provider Notes (Signed)
Chief complaint is earache and cough    History of present illness-this patient is been ill for one week with persistent productive cough containing a green sputum.  He has some chest discomfort with coughing.  He denies dyspnea or vomiting.  He also has some sore throat and a right earache.  He denies headache or abdominal pain.  He is a heavy cigarette smoker.  He is previously healthy except for a history of depression.  Nursing notes reviewed and I agree.    Social history-smokes 2 pack cigarettes per day    Review of systems-no hemoptysis or wheezing or drainage from the ears.  No facial pain or stiff neck.        Physical exam-  Vital signs reviewed by me  Well-developed well-nourished male appears stable  Neck supple  Skin without rash  ENT-inflamed pharynx and right eardrum  Lymph nodes-unremarkable  Chest-occasional rhonchi with no wheezing rales or retractions  Abdominal-no masses or tenderness or visceromegaly     medical decision making this patient has an upper respiratory infection manifested by right eardrum inflammation and inflamed pharynx.  Also probably has bronchitis related to smoking and infection.  My suspicion for pneumonia or influenza is low.    Alyson Locket Carissa Musick, MD  04/18/13 1027

## 2013-04-18 NOTE — Discharge Instructions (Signed)
Bronchitis  Bronchitis is a problem of the air tubes leading to your lungs. This problem makes it hard for air to get in and out of the lungs. You may cough a lot because your air tubes are narrow. Going without care can cause lasting (chronic) bronchitis.  HOME CARE    Drink enough fluids to keep your pee (urine) clear or pale yellow.   Use a cool mist humidifier.   Quit smoking if you smoke. If you keep smoking, the bronchitis might not get better.   Only take medicine as told by your doctor.  GET HELP RIGHT AWAY IF:    Coughing keeps you awake.   You start to wheeze.   You become more sick or weak.   You have a hard time breathing or get short of breath.   You cough up blood.   Coughing lasts more than 2 weeks.   You have a fever.   Your baby is older than 3 months with a rectal temperature of 102 F (38.9 C) or higher.   Your baby is 3 months old or younger with a rectal temperature of 100.4 F (38 C) or higher.  MAKE SURE YOU:   Understand these instructions.   Will watch your condition.   Will get help right away if you are not doing well or get worse.  Document Released: 05/09/2008 Document Revised: 02/13/2012 Document Reviewed: 10/23/2009  ExitCare Patient Information 2013 [employer].

## 2013-04-18 NOTE — ED Notes (Signed)
Pt c/o upper respiratory symptoms x approx 1 week. Bilateral ear pain with R worse than L. Nasal congestion. Dental pain. Productive cough with yellow sputum.    Charlcie Cradle, RN  04/18/13 1019

## 2013-04-24 ENCOUNTER — Inpatient Hospital Stay: Admit: 2013-04-24 | Discharge: 2013-04-24 | Discharge status: Against Medical Advice | Payer: MEDICAID

## 2013-04-24 ENCOUNTER — Inpatient Hospital Stay: Admit: 2013-04-24 | Discharge: 2013-04-25 | Attending: Emergency Medicine

## 2013-04-24 LAB — CBC WITH AUTO DIFFERENTIAL
Basophils %: 0.9 %
Basophils Absolute: 0.1 10*3/uL (ref 0.0–0.2)
Eosinophils %: 3.4 %
Eosinophils Absolute: 0.2 10*3/uL (ref 0.0–0.6)
Hematocrit: 45.9 % (ref 40.5–52.5)
Hemoglobin: 15 g/dL (ref 13.5–17.5)
Lymphocytes %: 25.3 %
Lymphocytes Absolute: 1.6 10*3/uL (ref 1.0–5.1)
MCH: 28.9 pg (ref 26.0–34.0)
MCHC: 32.6 g/dL (ref 31.0–36.0)
MCV: 88.6 fL (ref 80.0–100.0)
MPV: 9.6 fL (ref 5.0–10.5)
Monocytes %: 12.8 %
Monocytes Absolute: 0.8 10*3/uL (ref 0.0–1.3)
Neutrophils %: 57.6 %
Neutrophils Absolute: 3.6 10*3/uL (ref 1.7–7.7)
Platelets: 250 10*3/uL (ref 135–450)
RBC: 5.18 M/uL (ref 4.20–5.90)
RDW: 12.4 % (ref 12.4–15.4)
WBC: 6.3 10*3/uL (ref 4.0–11.0)

## 2013-04-24 LAB — URINALYSIS
Bilirubin Urine: NEGATIVE mg/dL
Blood, Urine: NEGATIVE
Glucose, Ur: NEGATIVE mg/dL
Ketones, Urine: NEGATIVE mg/dL
Leukocyte Esterase, Urine: NEGATIVE
Nitrite, Urine: NEGATIVE
Specific Gravity, UA: 1.015 (ref 1.005–1.030)
Urobilinogen, Urine: 2 E.U./dL — AB (ref <2.0)
pH, UA: 8 (ref 5.0–8.0)

## 2013-04-24 MED ORDER — SODIUM CHLORIDE 0.9 % IV BOLUS
0.9 % | Freq: Once | INTRAVENOUS | Status: AC
Start: 2013-04-24 — End: 2013-04-24
  Administered 2013-04-24: via INTRAVENOUS

## 2013-04-24 MED ORDER — ONDANSETRON HCL 4 MG/2ML IJ SOLN
4 MG/2ML | Freq: Once | INTRAMUSCULAR | Status: AC
Start: 2013-04-24 — End: 2013-04-24
  Administered 2013-04-24: via INTRAVENOUS

## 2013-04-24 MED FILL — ONDANSETRON HCL 4 MG/2ML IJ SOLN: 4 MG/2ML | INTRAMUSCULAR | Qty: 2

## 2013-04-24 NOTE — ED Provider Notes (Signed)
HPI     [facility]  Emergency Department Encounter    Pt Name: Brett [name]  MRN: [id number]  [name] Feb 09, 1986  Date of evaluation: 04/24/2013  Provider: Andres Shad, MD    Chief Complaint   Patient presents with   ??? Emesis       Nursing Notes, Past Medical Hx, Past Surgical Hx, Social Hx, Allergies, and Family Hx were all reviewed and agreed with or any disagreements were addressed in the HPI.    HPI:  (Location, Duration, Timing, Severity, Quality, Assoc Sx, Context, Modifying factors)  This is a  27 y.o. male presents with a complaint of nausea and vomiting.  The patient states that he moved up from [place] about one month ago.  He has been off of all of his psychiatric medications including medication for depression, anxiety, and bipolar disorder.  The patient is unsure the names of these medications.  He states that since he has been living locally, he has been feeling anxious and upset stomach.  He has vomited at least once a day during this time.  Today it became worse and he vomited 4 times today.  He denies diarrhea or abdominal pain.  Denies chest pain or shortness of breath.  He has been unable to followup with primary care physician because he does not qualify for Medicaid and has no health insurance.    Past Medical/Surgical History:      Diagnosis Date   ??? Thyroid disease      History reviewed. No pertinent past surgical history.    Medications:  No current facility-administered medications on file prior to encounter.     Current Outpatient Prescriptions on File Prior to Encounter   Medication Sig Dispense Refill   ??? amoxicillin (AMOXIL) 500 MG capsule Take 1 capsule by mouth 2 times daily.  20 capsule  0       Review of Systems   Constitutional: Negative for fever and chills.   Respiratory: Negative for cough and shortness of breath.    Cardiovascular: Negative for chest pain.   Gastrointestinal: Positive for nausea and vomiting. Negative for abdominal pain, diarrhea and  constipation.   Skin: Negative for pallor.   All other systems reviewed and are negative.        Physical Exam   Nursing note and vitals reviewed.  Constitutional: He is oriented to person, place, and time. He appears well-developed and well-nourished.   Poor hygiene   HENT:   Head: Normocephalic and atraumatic.   Neck: Normal range of motion. Neck supple.   Cardiovascular: Normal rate, regular rhythm and normal heart sounds.    Pulmonary/Chest: Effort normal and breath sounds normal. No respiratory distress. He has no wheezes. He has no rales.   Abdominal: Soft. He exhibits no distension. There is no tenderness. There is no rebound and no guarding.   Musculoskeletal: Normal range of motion.   Neurological: He is alert and oriented to person, place, and time.   Skin: He is not diaphoretic. No pallor.       Procedures    MDM  MDM  Number of Diagnoses or Management Options  Nausea & vomiting:     MEDICAL DECISION MAKING    Vitals:    Filed Vitals:    04/24/13 1856 04/24/13 2021   BP: 145/75 152/91   Pulse: 70 78   Temp: 98.4 ??F (36.9 ??C)    TempSrc: Oral    Resp: 18 18   Height: 5' 9"$  (  1.753 m)    Weight: 74.39 kg (164 lb)    SpO2: 99% 100%       LABS:   Labs Reviewed   BASIC METABOLIC PANEL - Abnormal; Notable for the following:     Chloride 96 (*)     CREATININE 0.8 (*)     All other components within normal limits   HEPATIC FUNCTION PANEL - Abnormal; Notable for the following:     ALT 446 (*)     AST 249 (*)     All other components within normal limits   URINALYSIS - Abnormal; Notable for the following:     Clarity, UA SL CLOUDY (*)     Protein, UA TRACE (*)     Urobilinogen, Urine 2.0 (*)     All other components within normal limits   MICROSCOPIC URINALYSIS - Abnormal; Notable for the following:     Bacteria, UA Rare (*)     Amorphous, UA 1+ (*)     All other components within normal limits   CBC WITH AUTO DIFFERENTIAL   LIPASE        Remainder of labs reviewed and were negative at this time or were not returned  as of this dictation.    EKG: N/A    X-RAYS: I Lenard Lance, MD have reviewed radiologic plain film image(s).  The plain films will be read or overread by the radiologist.  ALL OTHER NON-PLAIN FILM IMAGES SUCH AS CT, ULTRASOUND AND MRI HAVE BEEN READ BY THE RADIOLOGIST.        No results found.     MEDICAL DECISION MAKING / ED COURSE:      The patient presents with complaint of nausea with vomiting.  No significant dehydration.  No ketonuria.  The patient has nonspecific elevation of AST and ALT, and states he has been told this is an elevated in the past.  He has a benign abdominal exam and I do not suspect an acute surgical abdomen.  Nothing to suggest obstructive process, pancreatitis, cholecystitis, perforated viscus, diverticulitis, appendicitis.  No suspicion for ACS.  He was given IV fluids and antiemetics with improvement and states that he immediately felt his nausea subsided.  Symptoms may also be secondary to being off of his psychiatric medications.  Primary care referral given.    The patient tolerated their visit well. The patient and / or the family were informed of the results of any tests, a time was given to answer questions, a plan was proposed and they agreed with plan.      CLINICAL IMPRESSION:  1. Nausea & vomiting          DISCHARGE MEDICATIONS:  New Prescriptions    ONDANSETRON (ZOFRAN ODT) 4 MG DISINTEGRATING TABLET    Take 1-2 tablets by mouth every 12 hours as needed for Nausea.       (Please note the MDM and HPI sections of this note were completed with a voice recognition program.  Efforts were made to edit the dictations but occasionally words are mis-transcribed.)        Lenard Lance, MD  04/25/13 [phone]

## 2013-04-24 NOTE — ED Notes (Signed)
Pt resting quietly in ER stretcher. States relief of nausea. Family at bedside. Call light within reach. Pt states "feeling much better with these IV fluids".     Elonda Husky, RN  04/24/13 2024

## 2013-04-24 NOTE — ED Notes (Signed)
Pt tolerated PIV placement well. Blood and urine sent and medication given.     [place], RN  04/24/13 2037

## 2013-04-24 NOTE — Discharge Instructions (Signed)
Take nausea medication as needed.  Follow up with primary care physician to restart medications and if symptoms persist.  Return to the ER for inability to keep down liquids or worsening symptoms.     Nausea and Vomiting  Nausea is a sick feeling that often comes before throwing up (vomiting). Vomiting is a reflex where stomach contents come out of your mouth. Vomiting can cause severe loss of body fluids (dehydration). Children and elderly adults can become dehydrated quickly, especially if they also have diarrhea. Nausea and vomiting are symptoms of a condition or disease. It is important to find the cause of your symptoms.  CAUSES    Direct irritation of the stomach lining. This irritation can result from increased acid production (gastroesophageal reflux disease), infection, food poisoning, taking certain medicines (such as nonsteroidal anti-inflammatory drugs), alcohol use, or tobacco use.   Signals from the brain.These signals could be caused by a headache, heat exposure, an inner ear disturbance, increased pressure in the brain from injury, infection, a tumor, or a concussion, pain, emotional stimulus, or metabolic problems.   An obstruction in the gastrointestinal tract (bowel obstruction).   Illnesses such as diabetes, hepatitis, gallbladder problems, appendicitis, kidney problems, cancer, sepsis, atypical symptoms of a heart attack, or eating disorders.   Medical treatments such as chemotherapy and radiation.   Receiving medicine that makes you sleep (general anesthetic) during surgery.  DIAGNOSIS  Your caregiver may ask for tests to be done if the problems do not improve after a few days. Tests may also be done if symptoms are severe or if the reason for the nausea and vomiting is not clear. Tests may include:   Urine tests.   Blood tests.   Stool tests.   Cultures (to look for evidence of infection).   X-rays or other imaging studies.  Test results can help your caregiver make decisions about  treatment or the need for additional tests.  TREATMENT  You need to stay well hydrated. Drink frequently but in small amounts.You may wish to drink water, sports drinks, clear broth, or eat frozen ice pops or gelatin dessert to help stay hydrated.When you eat, eating slowly may help prevent nausea.There are also some antinausea medicines that may help prevent nausea.  HOME CARE INSTRUCTIONS    Take all medicine as directed by your caregiver.   If you do not have an appetite, do not force yourself to eat. However, you must continue to drink fluids.   If you have an appetite, eat a normal diet unless your caregiver tells you differently.   Eat a variety of complex carbohydrates (rice, wheat, potatoes, bread), lean meats, yogurt, fruits, and vegetables.   Avoid high-fat foods because they are more difficult to digest.   Drink enough water and fluids to keep your urine clear or pale yellow.   If you are dehydrated, ask your caregiver for specific rehydration instructions. Signs of dehydration may include:   Severe thirst.   Dry lips and mouth.   Dizziness.   Dark urine.   Decreasing urine frequency and amount.   Confusion.   Rapid breathing or pulse.  SEEK IMMEDIATE MEDICAL CARE IF:    You have blood or brown flecks (like coffee grounds) in your vomit.   You have black or bloody stools.   You have a severe headache or stiff neck.   You are confused.   You have severe abdominal pain.   You have chest pain or trouble breathing.   You do not urinate  at least once every 8 hours.   You develop cold or clammy skin.   You continue to vomit for longer than 24 to 48 hours.   You have a fever.  MAKE SURE YOU:    Understand these instructions.   Will watch your condition.   Will get help right away if you are not doing well or get worse.  Document Released: 11/21/2005 Document Revised: 02/13/2012 Document Reviewed: 04/20/2011  [facility] Patient Information 2014 [place], [place].

## 2013-04-24 NOTE — ED Notes (Signed)
Pt states was seen here 6 days ago. Was given antibiotics for bronchitis, otitis media, and pharyngitis. Pt states that he has been taking antibiotics and is feeling better but continues with vomiting. Pt states vomited 4 X today after eating or drinking anything. No active vomiting. Pt states upper abdominal pain "feels like acid coming up". Denies diarrhea/constipation. Denies difficulty urinating.     Elonda Husky, RN  04/24/13 1905

## 2013-04-24 NOTE — ED Notes (Signed)
Dr Collins Scotland aware of pt's liver enzymes. States that pt has a hx of elevated liver enzymes and will follow up with his PCP    Coralie Common, RN  04/24/13 2149

## 2013-04-25 LAB — HEPATIC FUNCTION PANEL
ALT: 446 U/L — ABNORMAL HIGH (ref 10–40)
AST: 249 U/L — ABNORMAL HIGH (ref 15–37)
Albumin: 4.8 g/dL (ref 3.4–5.0)
Alkaline Phosphatase: 109 U/L (ref 40–129)
Bilirubin, Direct: 0.2 mg/dL (ref 0.0–0.3)
Bilirubin, Indirect: 0.3 mg/dL (ref 0.0–1.0)
Total Bilirubin: 0.5 mg/dL (ref 0.0–1.0)
Total Protein: 7.4 g/dL (ref 6.4–8.2)

## 2013-04-25 LAB — LIPASE: Lipase: 26 U/L (ref 13.0–60.0)

## 2013-04-25 LAB — BASIC METABOLIC PANEL
BUN: 10 mg/dL (ref 7–20)
CO2: 32 mmol/L (ref 21–32)
Calcium: 9.2 mg/dL (ref 8.3–10.6)
Chloride: 96 mmol/L — ABNORMAL LOW (ref 99–110)
Creatinine: 0.8 mg/dL — ABNORMAL LOW (ref 0.9–1.3)
GFR African American: >60 (ref >60)
GFR Non-African American: >60 (ref >60)
Glucose: 82 mg/dL (ref 70–99)
Potassium: 3.6 mmol/L (ref 3.5–5.1)
Sodium: 138 mmol/L (ref 136–145)

## 2013-04-25 LAB — MICROSCOPIC URINALYSIS: RBC, UA: NONE SEEN /HPF (ref 0–2)

## 2013-07-22 ENCOUNTER — Inpatient Hospital Stay: Admit: 2013-07-22 | Discharge: 2013-07-22 | Disposition: A | Discharge status: Home / Self Care | Payer: MEDICAID

## 2013-07-22 ENCOUNTER — Emergency Department: Admit: 2013-07-22 | Payer: MEDICAID

## 2013-07-22 DIAGNOSIS — S02650A Fracture of angle of mandible, unspecified side, initial encounter for closed fracture: Secondary | ICD-10-CM

## 2013-07-22 MED ORDER — morphine 5 mg/mL injection 5 mg
5 | Freq: Once | INTRAMUSCULAR | Status: AC
Start: 2013-07-22 — End: 2013-07-22
  Administered 2013-07-22: 19:00:00 via INTRAMUSCULAR

## 2013-07-22 MED ORDER — oxyCODONE-acetaminophen (ROXICET) 5-325 mg/5 mL solution
5-325 | Freq: Four times a day (QID) | ORAL | Status: AC | PRN
Start: 2013-07-22 — End: 2013-07-26

## 2013-07-22 MED ORDER — HYDROcodone-acetaminophen (NORCO) 5-325 mg per tablet 1 tablet
5-325 | Freq: Once | ORAL | Status: AC
Start: 2013-07-22 — End: 2013-07-22
  Administered 2013-07-22: 17:00:00 via ORAL

## 2013-07-22 MED ORDER — oxyCODONE-acetaminophen (PERCOCET) 5-325 mg per tablet 1 tablet
5-325 | Freq: Once | ORAL | Status: AC
Start: 2013-07-22 — End: 2013-07-22
  Administered 2013-07-22: 20:00:00 via ORAL

## 2013-07-22 MED FILL — OXYCODONE-ACETAMINOPHEN 5 MG-325 MG TABLET: 5-325 5-325 mg | ORAL | Qty: 1

## 2013-07-22 MED FILL — MORPHINE 5 MG/ML INJECTION SOLUTION: 5 5 mg/mL | INTRAMUSCULAR | Qty: 1

## 2013-07-22 MED FILL — HYDROCODONE 5 MG-ACETAMINOPHEN 325 MG TABLET: 5-325 5-325 mg | ORAL | Qty: 1
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
## 2013-07-22 NOTE — Unmapped (Signed)
To OR on 8/20
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
## 2013-07-22 NOTE — Unmapped (Signed)
ED Attending Attestation Note    Date of service:  07/22/2013    This patient was seen by the mid-level provider.  I have seen and examined the patient, agree with the workup, evaluation, management and diagnosis.  The care plan has been discussed and I concur.      My assessment reveals a 27 y.o. male with pain over his left dry.  He has subjective and objective malocclusion.  No active bleeding.  The mandible is not appreciated on visual inspection.Marland Kitchen
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
## 2013-07-22 NOTE — Unmapped (Signed)
Patient discharged. Verbalized understanding. Alert and oriented x4. Respirations unlabored. Skin warm and dry. Gait steady. Speech clear.  Will follow up with maxillofacial surgeon as scheduled wednesday

## 2013-07-26 MED ORDER — lactated ringers infusion
Freq: Once | INTRAVENOUS | Status: AC
Start: 2013-07-26 — End: 2013-07-26
  Administered 2013-07-26: 11:00:00 via INTRAVENOUS

## 2013-07-26 MED ORDER — chlorhexidine (PERIDEX) 0.12 % solution
0.12 | Status: AC | PRN
Start: 2013-07-26 — End: 2013-07-26
  Administered 2013-07-26: 12:00:00 via TOPICAL

## 2013-07-26 MED ORDER — ceFAZolin (ANCEF) IVPB 1 g in D5W (duplex)
1 | INTRAVENOUS | Status: AC | PRN
Start: 2013-07-26 — End: 2013-07-26
  Administered 2013-07-26: 12:00:00 via INTRAVENOUS

## 2013-07-26 MED ORDER — bacitracin 50,000 Units in sodium chloride 0.9 % 1,000 mL IRRIGATION
50000 | INTRAMUSCULAR | Status: AC | PRN
Start: 2013-07-26 — End: 2013-07-26
  Administered 2013-07-26: 12:00:00

## 2013-07-26 MED ORDER — dexamethasone (DECADRON) injection
4 | INTRAMUSCULAR | Status: AC | PRN
Start: 2013-07-26 — End: 2013-07-26
  Administered 2013-07-26: 12:00:00 via INTRAVENOUS

## 2013-07-26 MED ORDER — fentaNYL (SUBLIMAZE) injection 12.5 mcg
50 | INTRAMUSCULAR | Status: AC | PRN
Start: 2013-07-26 — End: 2013-07-26

## 2013-07-26 MED ORDER — oxyCODONE-acetaminophen (PERCOCET) 5-325 mg per tablet
5-325 | ORAL_TABLET | Freq: Four times a day (QID) | ORAL | 0 refills | Status: AC | PRN
Start: 2013-07-26 — End: 2013-11-17
  Filled 2013-07-26: qty 20, 3d supply, fill #0

## 2013-07-26 MED ORDER — lidocaine-EPINEPHrine 1 %-1:100,000 injection
1 | INTRAMUSCULAR | Status: AC | PRN
Start: 2013-07-26 — End: 2013-07-26
  Administered 2013-07-26: 12:00:00

## 2013-07-26 MED ORDER — HYDROmorphone (DILAUDID) injection Syrg 0.6 mg
1 | INTRAMUSCULAR | Status: AC | PRN
Start: 2013-07-26 — End: 2013-07-26

## 2013-07-26 MED ORDER — rocuronium (ZEMURON) injection
10 | INTRAVENOUS | Status: AC | PRN
Start: 2013-07-26 — End: 2013-07-26
  Administered 2013-07-26: 12:00:00 via INTRAVENOUS

## 2013-07-26 MED ORDER — propofol 10 mg/ml (DIPRIVAN) injection
10 | INTRAVENOUS | Status: AC | PRN
Start: 2013-07-26 — End: 2013-07-26
  Administered 2013-07-26: 12:00:00 via INTRAVENOUS

## 2013-07-26 MED ORDER — acetaminophen (OFIRMEV) Soln 15 mg/kg
1000 | Freq: Once | INTRAVENOUS | Status: AC
Start: 2013-07-26 — End: 2013-07-26
  Administered 2013-07-26: 12:00:00 via INTRAVENOUS

## 2013-07-26 MED ORDER — HYDROmorphone (DILAUDID) injection Syrg 0.4 mg
1 | INTRAMUSCULAR | Status: AC | PRN
Start: 2013-07-26 — End: 2013-07-26

## 2013-07-26 MED ORDER — fentaNYL (SUBLIMAZE) 50 mcg/mL injection
50 | INTRAMUSCULAR | Status: AC
Start: 2013-07-26 — End: 2013-07-26
  Administered 2013-07-26: 14:00:00 via INTRAVENOUS

## 2013-07-26 MED ORDER — meperidine (PF) (DEMEROL) 25 mg/mL Syrg 12.5 mg
25 | INTRAMUSCULAR | Status: AC | PRN
Start: 2013-07-26 — End: 2013-07-26

## 2013-07-26 MED ORDER — ibuprofen (ADVIL,MOTRIN) 800 MG tablet
800 | ORAL_TABLET | Freq: Three times a day (TID) | ORAL | 2 refills | Status: AC | PRN
Start: 2013-07-26 — End: 2013-11-17
  Filled 2013-07-26: qty 30, 10d supply, fill #0

## 2013-07-26 MED ORDER — promethazine (PHENERGAN) injection 6.25 mg
25 | Freq: Four times a day (QID) | INTRAMUSCULAR | Status: AC | PRN
Start: 2013-07-26 — End: 2013-07-26

## 2013-07-26 MED ORDER — fentaNYL (SUBLIMAZE) injection 50 mcg
50 | INTRAMUSCULAR | Status: AC | PRN
Start: 2013-07-26 — End: 2013-07-26
  Administered 2013-07-26: 14:00:00 via INTRAVENOUS

## 2013-07-26 MED ORDER — nicotine (NICODERM CQ) 14 mg/24 hr
14 | MEDICATED_PATCH | TRANSDERMAL | Status: AC
Start: 2013-07-26 — End: 2013-11-17

## 2013-07-26 MED ORDER — ceFAZolin (ANCEF) in D5W 50 mL 1 gram/50 mL
1 | INTRAVENOUS | Status: AC
Start: 2013-07-26 — End: 2013-07-26

## 2013-07-26 MED ORDER — ondansetron (ZOFRAN) 4 mg/2 mL injection
4 | INTRAMUSCULAR | Status: AC | PRN
Start: 2013-07-26 — End: 2013-07-26
  Administered 2013-07-26: 12:00:00 via INTRAVENOUS

## 2013-07-26 MED ORDER — remifentanil (ULTIVA) 1 mg injection
1 | INTRAVENOUS | Status: AC
Start: 2013-07-26 — End: 2013-07-26
  Administered 2013-07-26: 12:00:00 via INTRAVENOUS

## 2013-07-26 MED ORDER — fentaNYL (SUBLIMAZE) injection 25 mcg
50 | INTRAMUSCULAR | Status: AC | PRN
Start: 2013-07-26 — End: 2013-07-26
  Administered 2013-07-26: 14:00:00 via INTRAVENOUS

## 2013-07-26 MED ORDER — nalOXone (NARCAN) injection 0.04 mg
0.4 | INTRAMUSCULAR | Status: AC | PRN
Start: 2013-07-26 — End: 2013-07-26

## 2013-07-26 MED ORDER — neostigmine (PROSTIGMINE) injection
1 | INTRAMUSCULAR | Status: AC | PRN
Start: 2013-07-26 — End: 2013-07-26
  Administered 2013-07-26: 14:00:00 via INTRAVENOUS

## 2013-07-26 MED ORDER — remifentanil (ULTIVA) 2 mg injection
2 | INTRAVENOUS | Status: AC
Start: 2013-07-26 — End: 2013-07-26

## 2013-07-26 MED ORDER — lactated ringers infusion
INTRAVENOUS | Status: AC
Start: 2013-07-26 — End: 2013-07-26
  Administered 2013-07-26: 10:00:00 via INTRAVENOUS

## 2013-07-26 MED ORDER — oxyCODONE (ROXICODONE) 5 mg/5 mL solution 10 mg
5 | Freq: Once | ORAL | Status: AC
Start: 2013-07-26 — End: 2013-07-26

## 2013-07-26 MED ORDER — cephALEXin (KEFLEX) 500 MG capsule
500 | ORAL_CAPSULE | Freq: Two times a day (BID) | ORAL | 0 refills | Status: AC
Start: 2013-07-26 — End: 2013-08-29
  Filled 2013-07-26: qty 2, 1d supply, fill #0

## 2013-07-26 MED ORDER — fentaNYL (SUBLIMAZE) injection
50 | INTRAMUSCULAR | Status: AC | PRN
Start: 2013-07-26 — End: 2013-07-26
  Administered 2013-07-26 (×3): via INTRAVENOUS

## 2013-07-26 MED ORDER — midazolam (PF) (VERSED) injection
1 | INTRAMUSCULAR | Status: AC | PRN
Start: 2013-07-26 — End: 2013-07-26
  Administered 2013-07-26: 11:00:00 via INTRAVENOUS

## 2013-07-26 MED ORDER — HYDROmorphone (DILAUDID) injection Syrg 0.2 mg
1 | INTRAMUSCULAR | Status: AC | PRN
Start: 2013-07-26 — End: 2013-07-26

## 2013-07-26 MED ORDER — HYDROmorphone (DILAUDID) injection Syrg
2 | INTRAMUSCULAR | Status: AC | PRN
Start: 2013-07-26 — End: 2013-07-26
  Administered 2013-07-26 (×2): via INTRAVENOUS

## 2013-07-26 MED ORDER — atropine injection
1 | INTRAMUSCULAR | Status: AC | PRN
Start: 2013-07-26 — End: 2013-07-26
  Administered 2013-07-26 (×2): via INTRAVENOUS

## 2013-07-26 MED ORDER — chlorhexidine (PERIDEX) 0.12 % solution
0.12 | Freq: Two times a day (BID) | 2 refills | 16.00000 days | Status: AC
Start: 2013-07-26 — End: 2013-08-29
  Filled 2013-07-26: qty 473, 16d supply, fill #0

## 2013-07-26 MED ORDER — ondansetron (ZOFRAN) 4 mg/2 mL injection 4 mg
4 | Freq: Three times a day (TID) | INTRAMUSCULAR | Status: AC | PRN
Start: 2013-07-26 — End: 2013-07-26

## 2013-07-26 MED ORDER — oxyCODONE-acetaminophen (ROXICET) 5-325 mg/5 mL solution
5-325 | ORAL | Status: AC
Start: 2013-07-26 — End: 2013-07-26
  Administered 2013-07-26: 15:00:00

## 2013-07-26 MED ORDER — lidocaine (PF) 20 mg/mL (2 %) Soln
20 | INTRAVENOUS | Status: AC | PRN
Start: 2013-07-26 — End: 2013-07-26
  Administered 2013-07-26: 12:00:00

## 2013-07-26 MED FILL — ROXICET 5 MG-325 MG/5 ML ORAL SOLUTION: 5-325 5-325 mg/5 mL | ORAL | Qty: 10

## 2013-07-26 MED FILL — FENTANYL (PF) 50 MCG/ML INJECTION SOLUTION: 50 50 mcg/mL | INTRAMUSCULAR | Qty: 2

## 2013-07-26 MED FILL — ULTIVA 2 MG INTRAVENOUS SOLUTION: 2 2 mg | INTRAVENOUS | Qty: 5

## 2013-07-26 MED FILL — CEFAZOLIN 1 GRAM/50 ML IN DEXTROSE (ISO-OSMOTIC) INTRAVENOUS PIGGYBACK: 1 1 gram/50 mL | INTRAVENOUS | Qty: 50

## 2013-07-26 MED FILL — ULTIVA 1 MG INTRAVENOUS SOLUTION: 1 1 mg | INTRAVENOUS | Qty: 2

## 2013-07-26 NOTE — Unmapped (Signed)
ADMIT TO PRE OP AREA ASSESSMENT COMPLETED ORDERS REVIEWED

## 2013-07-26 NOTE — Unmapped (Signed)
Anesthesia Extubation Criteria:    Emergence Details:      Smooth      _x_      Stormy       __       Prolonged   __     Extubation Criteria:      Motor strength intact       _x_      Follows commands         _x_      Good airway reflexes      _x_      OP suctioned                  _x_       Patient extubated:  Yes

## 2013-07-26 NOTE — Unmapped (Signed)
OPEN REDUCTION INTERNAL FIXATION LEFT MANDIBULAR ANGLE FRACTURE, EXTRACTION OF TEETH AS NECESSARY.  Procedure Note    Alexander [name]  07/26/2013      Pre-op Diagnosis: Left Mandibular Angle Fracture       Post-op Diagnosis: same    Procedure(s):  OPEN REDUCTION INTERNAL FIXATION LEFT MANDIBULAR ANGLE FRACTURE, EXTRACTION OF TEETH #17, 18.      Surgeon(s):  Isac Sarna, DDS  Laurance Flatten, DDS    Anesthesia: General    Staff:   Circulator: Jim Desanctis, RN; Gaylene Brooks, RN  Scrub Person: Lynett Fish, RN  Resident: Laurance Flatten, DDS  Preop Nurse: Gerrit Halls, RN    Estimated Blood Loss: 20cc                 Specimens: * No specimens in log *           Drains:             There were no complications unless listed below.        Laurance Flatten     Date: 07/26/2013  Time: 9:52 AM

## 2013-07-26 NOTE — Unmapped (Signed)
NO CHEWING FOR 6 WEEKS    Swelling:  Swelling reaches its peak at 48 -72 hours and then will gradually subside over the next two weeks. Do not apply ice after 72 hours; warm compresses can be used.    Pain:  Please follow instructions on prescribed medications.  Be mindful of drowsiness caused by narcotic pain medicines.    Nausea:  Post-operative nausea may be due to narcotic analgesics and other medications.  If you should vomit, remember that ???what you swallowed is liquid and what will come up is liquid???. Fortunately this is a very rare event. Clean your mouth afterwards and start on sips of cold water gradually increasing your intake as tolerated.      Oral Care:  You should brush your teeth with a soft bristle toothbrush and a small amount of toothpaste.  Also use Peridex mouthrinse, swish 15 ml for 30 seconds three times daily.  You may also rinse with warm salt water, particularly after meals, for 2 minutes three times daily.      If you have surgical braces (arch bars) placed, they may sometimes irritate your gums or the insides of your cheeks.  The best way to prevent this is to use orthodontic wax and place it over the areas of the metal which are irritating.  This wax can be purchased at most any pharmacy.      Nutrition:  Adequate caloric intake is essential for your well being. While some weight loss is to be expected, now is not the time to purposefully diet. You may have three or four cans of Ensure or Sustacal daily, Carnation instant breakfast, soups, juices, and milk shakes. You may also blenderize your food into a liquid. If your jaws are not wired shut, you can have food with a yogurt consistency such as whipped potatoes, cottage cheese, and baby food. However do not chew!! If you are urinating your normal amount then you are probably taking in enough fluids. Please remember, water has no calories.    Personal Hygiene:  Feel free to shower and shampoo when you are discharged. However, do not  shower with hot water as this may cause you to vasodilate and faint. Someone should be in the bathroom with you when you shower for the first time. If you have a chin dressing, this can come off on post-op day five. If it comes off in the shower it does not need to be replaced.       Sports:  Plan to take a break from strenuous activity for approximately 2 weeks following surgery. You should not participate in contact sports for four months. This will allow the Instructions Following General Anesthetic, Adult  A nurse specialized in giving anesthesia (anesthetist) or a doctor specialized in giving anesthesia (anesthesiologist) gave you a medicine that made you sleep while a procedure was performed. For as long as 24 hours following this procedure, you may feel:  ?? Dizzy.  ?? Weak.  ?? Drowsy.  AFTER THE PROCEDURE  After surgery, you will be taken to the recovery area where a nurse will monitor your progress. You will be allowed to go home when you are awake, stable, taking fluids well, and without complications.  For the first 24 hours following an anesthetic:  ?? Have a responsible person with you.  ?? Do not drive a car. If you are alone, do not take public transportation.  ?? Do not drink alcohol.  ?? Do not take medicine that has not  been prescribed by your caregiver.  ?? Do not sign important papers or make important decisions.  ?? You may resume normal diet and activities as directed.  ?? Change bandages (dressings) as directed.  ?? Only take over-the-counter or prescription medicines for pain, discomfort, or fever as directed by your caregiver.  If you have questions or problems that seem related to the anesthetic, call the hospital and ask for the anesthetist or anesthesiologist on call.  SEEK IMMEDIATE MEDICAL CARE IF:   ?? You develop a rash.  ?? You have difficulty breathing.  ?? You have chest pain.  ?? You develop any allergic problems.  Document Released: 02/27/2001 Document Revised: 02/13/2012 Document Reviewed:  10/08/2007  [employer]?? Patient Information ??2014 [employer]  healed bone to remodel and mature. Feel free to discuss the do???s and don???t???s of exercise with your surgeon.
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
## 2013-07-26 NOTE — Unmapped (Signed)
ANESTHESIOLOGY PRE-PROCEDURAL EVALUATION    Alexander [name] is a 27 y.o. year old male presenting for:    Procedure(s):  OPEN REDUCTION INTERNAL FIXATION LEFT MANDIBULAR ANGLE FRACTURE  EXTRACTION OF TEETH AS NECESSARY    Surgeon:   Isac Sarna, DDS    Anesthesia Evaluation    Patient summary reviewed and nursing notes reviewed.  All other systems reviewed and are negative.     No history of anesthetic complications (Patient has never had anesthesia, denies family history of anesthetic issues)   I have reviewed the History and Physical Exam, any relevant changes are noted in the anesthesia pre-operative evaluation.    Medical History / Review of Systems:    Cardiovascular:    Exercise tolerance: good  Duke Met score: 5 - Walking four miles per hour. Social dancing. Washing a car.  (-) hypertension, past MI, CAD, dysrhythmias, angina.    Neuro/Muscoloskeletal/Psych:    (+) psychiatric history (Depression).    (-) seizures, CVA, arthritis.   Comments: Mandible fracture    Hx of polysubstance abuse (ETOH, Crack/cocaine)    Pulmonary:      (-) COPD, asthma, shortness of breath, recent URI, sleep apnea.   ROS comment: 10 pack/year hx of tobacco abuse      GI/Hepatic/Renal:    (+) liver disease (Patient reports elevated liver enzymes 2/2 substance abuse).    (-) GERD, hepatitis, renal disease.    Endo/Other:        (-) diabetes mellitus, hypothyroidism, hyperthyroidism, no anemia, no bleeding disorder. No opioid use      PAST MEDICAL HISTORY:  Past Medical History   Diagnosis Date   ??? Depression    ??? Drug abuse and dependence    ??? H/O tooth extraction    ??? Thyroid disease    ??? Substance induced mood disorder    ??? PTSD (post-traumatic stress disorder)    ??? Anxiety    ??? Bipolar disorder    ??? Bronchitis    ??? Liver disease      Patient unaware of actual dx       PAST SURGICAL HISTORY:  History reviewed. No pertinent past surgical history.    FAMILY HISTORY:  Family History   Problem Relation Age of Onset   ??? Drug abuse  Brother    ??? Hypertension Mother    ??? Transient ischemic attack Mother    ??? Seizures Father    ??? Hypertension Father    ??? Cancer Father        SOCIAL HISTORY:  History     Social History   ??? Marital Status: Single     Spouse Name: N/A     Number of Children: N/A   ??? Years of Education: N/A     Occupational History   ??? Not on file.     Social History Main Topics   ??? Smoking status: Current Every Day Smoker -- 1.00 packs/day for 11 years     Types: Cigarettes   ??? Smokeless tobacco: Not on file   ??? Alcohol Use: 1.8 oz/week     3 Cans of beer per week      Comment: No longer drinks alcohol   ??? Drug Use: Yes     Special: Cocaine, Heroin, Other-see comments, Marijuana      Comment: 02/17/13 clean from drugs except for MJ since 2012.   ??? Sexually Active: Yes -- Male, Male partner(s)     Other Topics Concern   ??? Caffeine Use Yes   ???  Exercise No     Social History Narrative   ??? No narrative on file       MEDICATIONS:  No current facility-administered medications on file prior to encounter.     Current Outpatient Prescriptions on File Prior to Encounter   Medication Sig Dispense Refill   ??? ibuprofen (ADVIL,MOTRIN) 200 MG tablet Take 200 mg by mouth every 6 hours as needed for Pain.       ??? oxyCODONE-acetaminophen (ROXICET) 5-325 mg/5 mL solution Take 10 mLs by mouth every 6 hours as needed for Pain.  240 mL  0   ??? risperiDONE (RISPERDAL M-TABS) 2 MG disintegrating tablet Take 1 tablet (2 mg total) by mouth at bedtime.       ??? traMADol (ULTRAM) 50 mg tablet Take 1 tablet (50 mg total) by mouth every 6 hours as needed for Pain.  20 tablet  0       ALLERGIES:  No Known Allergies    VITAL SIGNS:  Wt Readings from Last 3 Encounters:   07/26/13 159 lb 8 oz (72.349 kg)   07/26/13 159 lb 8 oz (72.349 kg)   01/13/12 161 lb (73.029 kg)     Ht Readings from Last 3 Encounters:   07/26/13 5' 9 (1.753 m)   07/26/13 5' 9 (1.753 m)   01/13/12 5' 9 (1.753 m)     Temp Readings from Last 3 Encounters:   07/26/13 99.2 ??F (37.3 ??C) Oral    07/26/13 99.2 ??F (37.3 ??C) Oral   07/22/13 98.5 ??F (36.9 ??C) Oral     BP Readings from Last 3 Encounters:   07/26/13 133/72   07/26/13 133/72   07/22/13 160/97     Pulse Readings from Last 3 Encounters:   07/26/13 69   07/26/13 69   07/22/13 90     SpO2 Readings from Last 3 Encounters:   07/26/13 96%   07/26/13 96%   07/22/13 100%       Physical Exam:    Airway:     Mallampati: III  Mouth Opening: >2 FB  TM distance: > = 3 FB  Neck ROM: full  (-) neck not short     (+) facial hair    Dental:   - normal exam     Pulmonary:       Breath sounds clear to auscultation.       Cardiovascular:     Rhythm: regular  Rate: normal    Neuro/Musculoskeletal/Psych:    Mental status: alert and oriented to person, place and time.          Abdominal:     Not obese.    Current OB Status:       Other Findings:        LAB RESULTS:  Lab Results   Component Value Date    WBC 9.3 01/13/2012    HGB 17.2* 01/13/2012    HCT 49.6 01/13/2012    MCV 89.5 01/13/2012    PLT 269 01/13/2012       No results found for this basename: ABORH       Lab Results   Component Value Date    GLUCOSE 82 01/13/2012    BUN 11 01/13/2012    CO2 28 01/13/2012    CREATININE 0.98 01/13/2012    K 4.0 01/13/2012    NA 146 01/13/2012    CL 100 01/13/2012    CALCIUM 9.6 01/13/2012    ALBUMIN 5.8* 01/13/2012    PROT 9.7* 01/13/2012  ALKPHOS 107 01/13/2012    ALT 54 01/13/2012    AST 40 01/13/2012    BILITOT 0.6 01/13/2012       No results found for this basename: PT, PTT, INR       No results found for this basename: PREGTESTUR, PREGSERUM, HCG, HCGQUANT       Anesthesia Plan:    ASA 2     Anesthesia Type:  general endotracheal.   (GETA w/ nasal rae, PIV, multimodal IV pain control, standard monitors, PACU post op)  Intravenous induction.    Anesthetic plan and risks discussed with patient.    Plan, alternatives, and risks of anesthesia, including death, have been explained to and discussed with the patient/legal guardian.  By my assessment, the patient/legal guardian understands and agrees.  Scenario  presented in detail.  Questions answered.      Plan discussed with CRNA and attending.

## 2013-07-26 NOTE — Unmapped (Addendum)
Anesthesia Transfer of Care Note    Patient: Alexander [name]  Procedure(s) Performed: Procedure(s):  OPEN REDUCTION INTERNAL FIXATION LEFT ANGLE FRACTURE, EXT # 17, # 18    Patient location: PACU    Post pain: Adequate analgesia    Post assessment: no apparent anesthetic complications, tolerated procedure well and no evidence of recall    Post vital signs:    Filed Vitals:    07/26/13 0959   BP: 155/93   Pulse: 71   Temp: 98.4 ??F (36.9 ??C)   Resp: 15   SpO2: 100%   RA    Level of consciousness: awake, alert  and oriented    Complications: None

## 2013-07-26 NOTE — Unmapped (Signed)
Anesthesia Post Note    Patient: Alexander [name]    Procedure(s) Performed: Procedure(s):  OPEN REDUCTION INTERNAL FIXATION LEFT ANGLE FRACTURE, EXT # 17, # 18    Anesthesia type: general endotracheal    Patient location: PACU    Post pain: Adequate analgesia    Post assessment: no apparent anesthetic complications and tolerated procedure well    Last Vitals:   Filed Vitals:    07/26/13 1030   BP:    Pulse:    Temp: 98.2 ??F (36.8 ??C)   Resp:    SpO2: 95%       Post vital signs: stable    Level of consciousness: awake    Complications: None

## 2013-08-29 ENCOUNTER — Emergency Department: Admit: 2013-08-30 | Payer: MEDICAID

## 2013-08-29 DIAGNOSIS — IMO0002 Reserved for concepts with insufficient information to code with codable children: Secondary | ICD-10-CM

## 2013-08-29 MED ORDER — Diphth Pertus AC Tetanus Vaccine
2 | Freq: Once | INTRAMUSCULAR | Status: AC
Start: 2013-08-29 — End: 2013-08-30

## 2013-08-29 MED ORDER — lactated ringers 1,000 mL bolus
Freq: Once | INTRAVENOUS | Status: AC
Start: 2013-08-29 — End: 2013-08-30
  Administered 2013-08-30: 04:00:00 via INTRAVENOUS

## 2013-08-29 NOTE — Unmapped (Signed)
ED Screening Protocol - Yes
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
## 2013-08-30 ENCOUNTER — Inpatient Hospital Stay: Admit: 2013-08-30 | Discharge: 2013-08-30 | Disposition: A | Discharge status: Home / Self Care | Payer: MEDICAID

## 2013-08-30 LAB — BASIC METABOLIC PANEL
Anion Gap: 11 mmol/L (ref 3–16)
BUN: 11 mg/dL (ref 7–25)
CO2: 24 mmol/L (ref 21–33)
Calcium: 9 mg/dL (ref 8.6–10.2)
Chloride: 104 mmol/L (ref 98–110)
Creatinine: 0.79 mg/dL (ref 0.50–1.30)
GFR MDRD Af Amer: 145 See note.
GFR MDRD Non Af Amer: 120 See note.
Glucose: 100 mg/dL — ABNORMAL HIGH (ref 65–99)
Osmolality, Calculated: 287 mosm/kg (ref 278–305)
Potassium: 4.1 mmol/L (ref 3.5–5.3)
Sodium: 139 mmol/L (ref 135–146)

## 2013-08-30 LAB — ANTIBODY SCREEN: Antibody Screen: NEGATIVE

## 2013-08-30 LAB — CBC
Hematocrit: 43.4 % (ref 38.5–50.0)
Hemoglobin: 15.3 g/dL (ref 13.2–17.1)
MCH: 31.2 pg (ref 27.0–33.0)
MCHC: 35.2 g/dL (ref 32.0–36.0)
MCV: 88.7 fL (ref 80.0–100.0)
MPV: 8.9 fL (ref 7.5–11.5)
Platelets: 238 10E3/uL (ref 140–400)
RBC: 4.89 10E6/uL (ref 4.20–5.80)
RDW: 13.1 % (ref 11.0–15.0)
WBC: 7.2 10E3/uL (ref 3.8–10.8)

## 2013-08-30 LAB — BUN: BUN: 12 mg/dL (ref 7–25)

## 2013-08-30 LAB — CREATININE, SERUM
Creatinine: 0.82 mg/dL (ref 0.50–1.30)
GFR MDRD Af Amer: 139 See note.
GFR MDRD Non Af Amer: 114 See note.

## 2013-08-30 LAB — ETHANOL, SERUM: Ethanol: 14 mg/dL — ABNORMAL HIGH (ref 0–10)

## 2013-08-30 LAB — ED BLOOD GAS PANEL, VENOUS
%HBO2, Venous: 71.8 % (ref 40.0–70.0)
Base Excess, Ven: 2.4 mmol/L (ref <=3.0)
CO2 Content, Venous: 29 mmol/L (ref 25–29)
Carboxyhemoglobin, Venous: 8.8 % (ref 0.0–2.0)
Free Calcium, WB: 4.86 mg/dL (ref 4.50–5.30)
Glucose: 108 mg/dL (ref 65–99)
HCO3, Ven: 27 mmol/L (ref 24–28)
Hematocrit. Blood Gas Panel: 47 % (ref 40–52)
Hemoglobin, Blood Gas Panel: 15.3 g/dL (ref 14.0–18.0)
Lactate, Ven: 1.4 mmol/L (ref 0.5–1.6)
Methemoglobin, Venous: 0.4 % (ref 0.0–1.5)
Potassium: 3.7 mmol/L (ref 3.5–5.3)
Reduced hemoglobin, Venous: 19 % (ref 0.0–5.0)
Sodium: 142 mmol/L (ref 136–146)
pCO2, Ven: 44 mm Hg (ref 41–51)
pH, Ven: 7.41 (ref 7.32–7.42)
pO2, Ven: 39 mm Hg (ref 25–40)

## 2013-08-30 LAB — MAGNESIUM: Magnesium: 1.7 mg/dL (ref 1.5–2.5)

## 2013-08-30 LAB — T4, FREE: Free T4: 0.97 ng/dL (ref 0.61–1.76)

## 2013-08-30 LAB — ABO/RH: Rh Type: POSITIVE

## 2013-08-30 LAB — TSH: TSH: 1.42 mIU/L (ref 0.45–4.50)

## 2013-08-30 MED ORDER — HYDROmorphone (DILAUDID) injection Syrg 1 mg
2 | Freq: Once | INTRAMUSCULAR | Status: AC
Start: 2013-08-30 — End: 2013-08-30
  Administered 2013-08-30: 05:00:00 via INTRAVENOUS

## 2013-08-30 MED ORDER — OMNIPAQUE (iohexol) 350 mg iodine/mL 100 mL
350 | Freq: Once | INTRAVENOUS | Status: AC | PRN
Start: 2013-08-30 — End: 2013-08-30
  Administered 2013-08-30: 06:00:00 via INTRAVENOUS

## 2013-08-30 MED ORDER — ibuprofen (ADVIL,MOTRIN) 600 MG tablet
600 | ORAL_TABLET | Freq: Four times a day (QID) | ORAL | Status: AC | PRN
Start: 2013-08-30 — End: 2013-12-12

## 2013-08-30 MED FILL — HYDROMORPHONE 2 MG/ML INJECTION SYRINGE: 2 2 mg/mL | INTRAMUSCULAR | Qty: 1

## 2013-08-30 MED FILL — OMNIPAQUE 350 MG IODINE/ML INTRAVENOUS SOLUTION: 350 350 mg iodine/mL | INTRAVENOUS | Qty: 100

## 2013-08-30 NOTE — Unmapped (Signed)
Pt resting in bed with eyes closed. Respirations E/U. No distress noted. Will continue to monitor pt and report changes.
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
## 2013-08-30 NOTE — Unmapped (Signed)
Pt to ERCT.
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
## 2013-08-30 NOTE — Unmapped (Signed)
Report from [facility]. Pt resting with eyes closed, resp equal and unlabored. Pt easily aroused. Friend at bedside, awaits results.

## 2013-08-30 NOTE — Unmapped (Signed)
Pt resting in bed talking with visitor. Respirations E/U. No distress noted.

## 2013-08-30 NOTE — Unmapped (Signed)
Pt returned to A-3. Awaiting CT results.

## 2013-11-13 ENCOUNTER — Inpatient Hospital Stay
Admit: 2013-11-13 | Discharge: 2013-11-14 | Disposition: A | Discharge status: Home / Self Care | Payer: PRIVATE HEALTH INSURANCE

## 2013-11-13 DIAGNOSIS — F10939 Alcohol use, unspecified with withdrawal, unspecified (CMS-HCC): Secondary | ICD-10-CM

## 2013-11-13 LAB — URINALYSIS-MACROSCOPIC W/REFLEX TO MICROSCOPIC
Blood, UA: NEGATIVE
Glucose, UA: NEGATIVE mg/dL
Ketones, UA: 15 mg/dL
Leukocytes, UA: NEGATIVE
Nitrite, UA: NEGATIVE
Protein, UA: 30 mg/dL
Specific Gravity, UA: >=1.03 (ref 1.005–1.035)
Urobilinogen, UA: 0.2 EU/dL (ref 0.2–1.0)
pH, UA: 6 (ref 5.0–8.0)

## 2013-11-13 LAB — CBC
Hematocrit: 47 % (ref 38.5–50.0)
Hemoglobin: 16.1 g/dL (ref 13.2–17.1)
MCH: 30.4 pg (ref 27.0–33.0)
MCHC: 34.2 g/dL (ref 32.0–36.0)
MCV: 88.8 fL (ref 80.0–100.0)
MPV: 9.2 fL (ref 7.5–11.5)
Platelets: 269 10*3/uL (ref 140–400)
RBC: 5.29 10*6/uL (ref 4.20–5.80)
RDW: 12.7 % (ref 11.0–15.0)
WBC: 8.2 10*3/uL (ref 3.8–10.8)

## 2013-11-13 LAB — DIFFERENTIAL
Basophils Absolute: 49 /uL (ref 0–200)
Basophils Relative: 0.6 % (ref 0.0–1.0)
Eosinophils Absolute: 33 /uL (ref 15–500)
Eosinophils Relative: 0.4 % (ref 0.0–8.0)
Lymphocytes Absolute: 1140 /uL (ref 850–3900)
Lymphocytes Relative: 13.9 % (ref 15.0–45.0)
Monocytes Absolute: 599 /uL (ref 200–950)
Monocytes Relative: 7.3 % (ref 0.0–12.0)
Neutrophils Absolute: 6380 /uL (ref 1500–7800)
Neutrophils Relative: 77.8 % (ref 40.0–80.0)

## 2013-11-13 LAB — BASIC METABOLIC PANEL
Anion Gap: 12 mmol/L (ref 3–16)
BUN: 10 mg/dL (ref 7–25)
CO2: 26 mmol/L (ref 21–33)
Calcium: 9.5 mg/dL (ref 8.6–10.2)
Chloride: 103 mmol/L (ref 98–110)
Creatinine: 0.91 mg/dL (ref 0.50–1.30)
GFR MDRD Af Amer: 123 See note.
GFR MDRD Non Af Amer: 102 See note.
Glucose: 94 mg/dL (ref 65–99)
Osmolality, Calculated: 291 mOsm/kg (ref 278–305)
Potassium: 4.2 mmol/L (ref 3.5–5.3)
Sodium: 141 mmol/L (ref 135–146)

## 2013-11-13 LAB — VENOUS BLOOD GAS, LINE/SYRINGE
%HBO2-Line Draw: 47.8 % (ref 40.0–70.0)
Base Excess-Line Draw: 3.1 mmol/L (ref <=3.0)
CO2 Content-Line Draw: 31 mmol/L (ref 25–29)
Carboxyhgb-Line Draw: 3.1 % (ref 0.0–2.0)
HCO3-Line Draw: 29 mmol/L (ref 24–28)
Methemoglobin-Line Draw: 0.5 % (ref 0.0–1.5)
PCO2-Line Draw: 51 mm Hg (ref 41–51)
PH-Line Draw: 7.37 (ref 7.32–7.42)
PO2-Line Draw: 28 mm Hg (ref 25–40)
Reduced Hemoglobin-Line Draw: 48.6 % (ref 0.0–5.0)

## 2013-11-13 LAB — URINALYSIS, MICROSCOPIC
RBC, UA: 5 /HPF (ref 0–3)
WBC, UA: 3 /HPF (ref 0–5)

## 2013-11-13 LAB — URINE CULTURE

## 2013-11-13 LAB — MAGNESIUM: Magnesium: 1.8 mg/dL (ref 1.5–2.5)

## 2013-11-13 MED ORDER — ketorolac (TORADOL) injection 15 mg
15 | Freq: Once | INTRAMUSCULAR | Status: AC
Start: 2013-11-13 — End: 2013-11-13
  Administered 2013-11-13: 22:00:00 via INTRAVENOUS

## 2013-11-13 MED ORDER — diazepamVALIUMtablet10mg
5 | Freq: Once | ORAL | Status: AC
Start: 2013-11-13 — End: 2013-11-13
  Administered 2013-11-13: 20:00:00 via ORAL

## 2013-11-13 MED ORDER — thiamine (VITAMIN B-1) tablet 100 mg
100 | Freq: Once | ORAL | Status: AC
Start: 2013-11-13 — End: 2013-11-13
  Administered 2013-11-13: 20:00:00 via ORAL

## 2013-11-13 MED ORDER — multivitamin (THERAGRAN) tablet 1 tablet
Freq: Once | ORAL | Status: AC
Start: 2013-11-13 — End: 2013-11-13
  Administered 2013-11-13: 20:00:00 via ORAL

## 2013-11-13 MED ORDER — sodium chloride 0.9 % 1,000 mL bolus
Freq: Once | INTRAVENOUS | Status: AC
Start: 2013-11-13 — End: 2013-11-13
  Administered 2013-11-13: 22:00:00 via INTRAVENOUS

## 2013-11-13 MED ORDER — LORazepam (ATIVAN) 1 MG tablet
1 | ORAL_TABLET | Freq: Two times a day (BID) | ORAL | Status: AC
Start: 2013-11-13 — End: 2013-11-16

## 2013-11-13 MED ORDER — sodium chloride 0.9 % 1,000 mL bolus
Freq: Once | INTRAVENOUS | Status: AC
Start: 2013-11-13 — End: 2013-11-13
  Administered 2013-11-13: 20:00:00 via INTRAVENOUS

## 2013-11-13 MED ORDER — LORazepam (ATIVAN) 2 mg/mL injection
2 | Freq: Once | INTRAMUSCULAR | Status: AC
Start: 2013-11-13 — End: 2013-11-13

## 2013-11-13 MED ORDER — sodium chloride 0.9 % 1,000 mL bolus
Freq: Once | INTRAVENOUS | Status: AC
Start: 2013-11-13 — End: 2013-11-13
  Administered 2013-11-13: 21:00:00 via INTRAVENOUS

## 2013-11-13 MED ORDER — folic acid (FOLVITE) tablet 1 mg
1 | Freq: Once | ORAL | Status: AC
Start: 2013-11-13 — End: 2013-11-13
  Administered 2013-11-13: 20:00:00 via ORAL

## 2013-11-13 MED FILL — THIAMINE HCL (VITAMIN B1) 100 MG TABLET: 100 100 MG | ORAL | Qty: 1

## 2013-11-13 MED FILL — DIAZEPAM 5 MG TABLET: 5 5 MG | ORAL | Qty: 2

## 2013-11-13 MED FILL — FOLIC ACID 1 MG TABLET: 1 1 MG | ORAL | Qty: 1

## 2013-11-13 MED FILL — KETOROLAC 15 MG/ML INJECTION SOLUTION: 15 15 mg/mL | INTRAMUSCULAR | Qty: 1

## 2013-11-13 MED FILL — THERA 400 MCG TABLET: 400 400 mcg | ORAL | Qty: 1

## 2013-11-13 NOTE — Unmapped (Signed)
Pt drinking multiple cups of water attempting to provide urine sample.

## 2013-11-13 NOTE — Unmapped (Signed)
Pt states hasnt drank in 48 hrs  Feels weak and shaky  MD at bedside

## 2013-11-13 NOTE — Unmapped (Signed)
ED Attending Attestation Note    Date of service:  11/13/2013    This patient was seen by the resident physician.  I have seen and examined the patient, agree with the workup, evaluation, management and diagnosis. The care plan has been discussed and I concur.     My assessment reveals a 27 y.o. male who is well-appearing and in no acute distress.  He is awake, alert, and oriented.  His abdomen is soft, nontender palpation, and without evidence of acute peritonitis.  There is no costovertebral angle tenderness to palpation.  His neurologic examination has no acute focal deficit, to include gait assessment.  There is no direct tenderness palpation of his thoracic or lumbosacral spinous processes.

## 2013-11-13 NOTE — Unmapped (Signed)
ED Note    Reason for Visit: Flank Pain      Patient History     HPI: Alexander [name] is a 27 y.o. male with a history of recent alcohol abuse who presents to the Emergency Department with feelings of shakiness. The patient, over the previous several years, has been drinking approximate a 12 pack of beer daily. 3 days ago, he decided to stop this because he wanted to stop drinking. He did not wean himself down. He has never withdrawn from alcohol before, denies seizure, delirium tremens, or previous hospitalizations for alcohol withdrawal. He states that her last 24 hours he is to increasingly tremulous, and after walking approximately one block, he feels tremors in his legs which require him to sit down. He also reports bilateral flank pain and some dysuria. He denies fevers at home, nausea, vomiting.     Other than above, there are no modifying, alleviating or aggravating factors.    Past Medical History   Diagnosis Date   ??? Depression    ??? Drug abuse and dependence    ??? H/O tooth extraction    ??? Thyroid disease    ??? Substance induced mood disorder    ??? PTSD (post-traumatic stress disorder)    ??? Anxiety    ??? Bipolar disorder    ??? Bronchitis    ??? Liver disease      Patient unaware of actual dx       Past Surgical History   Procedure Laterality Date   ??? Mandible surgery          reports that he has been smoking Cigarettes.  He has a 22 pack-year smoking history. He does not have any smokeless tobacco history on file. He reports that he drinks about 1.8 ounces of alcohol per week. He reports that he uses illicit drugs (Cocaine, Heroin, Other-see comments, and Marijuana).    Discharge Medication List as of 11/13/2013  7:16 PM      CONTINUE these medications which have NOT CHANGED    Details   !! ibuprofen (ADVIL,MOTRIN) 600 MG tablet Take 1 tablet (600 mg total) by mouth every 6 hours as needed for Pain., Starting 08/30/2013, Until Discontinued, Print       !! ibuprofen (ADVIL,MOTRIN) 800 MG tablet Take 1 tablet (800 mg total) by mouth every 8 hours as needed for Pain or Fever., Starting 07/26/2013, Until Discontinued, Print      nicotine (NICODERM CQ) 14 mg/24 hr Place 1 patch onto the skin daily., Starting 07/26/2013, Until Discontinued, Print      oxyCODONE-acetaminophen (PERCOCET) 5-325 mg per tablet Take 1-2 tablets by mouth every 6 hours as needed for Pain., Starting 07/26/2013, Until Discontinued, Print      risperiDONE (RISPERDAL M-TABS) 2 MG disintegrating tablet Take 1 tablet (2 mg total) by mouth at bedtime., Starting 11/07/2011, Until Discontinued, No Print       !! - Potential duplicate medications found. Please discuss with provider.          Allergies:   Allergies as of 11/13/2013   ??? (No Known Allergies)       Review of Systems     ROS: Please refer to the HPI for pertinent positives and negatives, all of the systems were reviewed and were negative unless otherwise stated.    Physical Exam     Filed Vitals:    11/13/13 1408 11/13/13 1647 11/13/13 1903   BP: 158/100 138/81 141/80   Pulse: 106 87 64  Temp: 98.6 ??F (37 ??C) 98.2 ??F (36.8 ??C) 97.2 ??F (36.2 ??C)   TempSrc: Oral Oral Oral   Resp: 18 14 19    SpO2: 100% 100% 98%       Constitutional: Anxious appearing Caucasian male  Eyes:  PERRL, EOMI, sclera nonicteric  HEENT:  Normocephalic, moist oral mucosa, posterior oropharynx clear  Respiratory:  No respiratory distress, breath sounds clear to auscultation bilaterally, no wheezes, rales, or rhonchi  Cardiovascular: Regular rate and rhythm, no murmurs, rubs, or gallops  GI:  Soft, nontender, no fluid wave, nondistended, no reproducible flank tenderness bilaterally  Musculoskeletal:  No peripheral edema, no tenderness, no deformities  Neurologic:  Alert & oriented x 4, normal and tandem gait without difficulty. Romberg negative    Diagnostic Studies     Labs:  Results for orders placed during the hospital encounter of 11/13/13   URINALYSIS-MACROSCOPIC  W/REFLEX TO MICROSCOPIC       Result Value Range    Color, UA Yellow  Yellow,Straw    Clarity, UA Clear  Clear    Specific Gravity, UA >=1.030  1.005 - 1.035    pH, UA 6.0  5.0 - 8.0    Protein, UA 30 mg/dL (*) Negative mg/dL    Glucose, UA Negative  Negative mg/dL    Ketones, UA 15 mg/dL (*) Negative mg/dL    Bilirubin, UA Small (*) Negative    Blood, UA Negative  Negative    Nitrite, UA Negative  Negative    Urobilinogen, UA 0.2 E.U./dL  0.2 - 1.0 EU/dL    Leukocytes, UA Negative  Negative   BASIC METABOLIC PANEL       Result Value Range    Sodium 141  135 - 146 mmol/L    Potassium 4.2  3.5 - 5.3 mmol/L    Chloride 103  98 - 110 mmol/L    CO2 26  21 - 33 mmol/L    Anion Gap 12  3 - 16 mmol/L    BUN 10  7 - 25 mg/dL    Creatinine 1.61  0.96 - 1.30 mg/dL    Glucose 94  65 - 99 mg/dL    Calcium 9.5  8.6 - 04.5 mg/dL    GFR MDRD Af Amer 409      GFR MDRD Non Af Amer 102      Osmolality, Calculated 291  278 - 305 mOsm/kg   MAGNESIUM       Result Value Range    Magnesium 1.8  1.5 - 2.5 mg/dL   CBC       Result Value Range    WBC 8.2  3.8 - 10.8 10E3/uL    RBC 5.29  4.20 - 5.80 10E6/uL    Hemoglobin 16.1  13.2 - 17.1 g/dL    Hematocrit 81.1  91.4 - 50.0 %    MCV 88.8  80.0 - 100.0 fL    MCH 30.4  27.0 - 33.0 pg    MCHC 34.2  32.0 - 36.0 g/dL    RDW 78.2  95.6 - 21.3 %    Platelets 269  140 - 400 10E3/uL    MPV 9.2  7.5 - 11.5 fL   DIFFERENTIAL       Result Value Range    Neutrophils Relative 77.8  40.0 - 80.0 %    Lymphocytes Relative 13.9 (*) 15.0 - 45.0 %    Monocytes Relative 7.3  0.0 - 12.0 %    Eosinophils Relative 0.4  0.0 - 8.0 %  Basophils Relative 0.6  0.0 - 1.0 %    Neutrophils Absolute 6380  1500 - 7800 /uL    Lymphocytes Absolute 1140  850 - 3900 /uL    Monocytes Absolute 599  200 - 950 /uL    Eosinophils Absolute 33  15 - 500 /uL    Basophils Absolute 49  0 - 200 /uL   VENOUS BLOOD GAS, LINE/SYRINGE       Result Value Range    PH-Line Draw 7.37  7.32 - 7.42    PCO2-Line Draw 51  41 - 51 mm Hg    PO2-Line  Draw 28  25 - 40 mm Hg    HCO3-Line Draw 29 (*) 24 - 28 mmol/L    CO2 Content-Line Draw 31 (*) 25 - 29 mmol/L    Base Excess-Line Draw 3.1 (*) -2.0 - 3.0 mmol/L    %HBO2-Line Draw 47.8  40.0 - 70.0 %    Carboxyhgb-Line Draw 3.1 (*) 0.0 - 2.0 %    Methemoglobin-Line Draw 0.5  0.0 - 1.5 %    Reduced Hemoglobin-Line Draw 48.6 (*) 0.0 - 5.0 %   URINALYSIS, MICROSCOPIC       Result Value Range    RBC, UA 5 (*) 0 - 3 /HPF    WBC, UA 3  0 - 5 /HPF    Bacteria, UA Rare (*) None Seen /HPF    Mucus, UA Present (*) None Seen /HPF     ED Course and MDM     RUTGER SALTON is a 27 y.o. male who presented to the emergency department with flank pain and tremors. Route, the patient is tachycardic, hypertensive, and anxious appearing consistent with alcohol withdrawal. Was given 10 milligrams of oral diazepam with significant improvement of symptoms. The patient had a totally intact neurologic examination including tandem gait. Denies any pain in his legs to suggest claudication, I think this is secondary to tremors from alcohol withdrawal. The patient is located dysuria were evaluated with urinalysis, and were found to be negative, and the patient is a one antibiotics.    The patient appears is here by his desire to quit alcohol, will be given a brief three-day taper of point 5 milligrams twice a day of Ativan, as well as resources for outpatient detoxification. He was given return precautions for seizure or worsening signs withdrawal, but be discharged home in good condition, with improved with pain, and a benign abdominal exam.      Clinical Impression:  Alcohol Withdrawl    Plan:  Discharge in good condition  Ativan po 0.5mg  BID x 3 days            Jonna Munro, MD  Resident  11/13/13 2124

## 2013-11-13 NOTE — Unmapped (Signed)
C/o FLANK PAIN FOR 1 DAY WITH DARK COLORED URINE.  -N/V/D -DYSURIA.

## 2013-11-13 NOTE — Unmapped (Signed)
ED screening protocol - NO

## 2013-11-14 ENCOUNTER — Inpatient Hospital Stay
Admission: EM | Admit: 2013-11-14 | Discharge: 2013-11-17 | Disposition: A | Discharge status: Home / Self Care | Payer: PRIVATE HEALTH INSURANCE | Admitting: Psychiatry

## 2013-11-14 DIAGNOSIS — F39 Unspecified mood [affective] disorder: Principal | ICD-10-CM

## 2013-11-14 LAB — HEPATIC FUNCTION PANEL
ALT: 390 U/L (ref 13–69)
AST: 233 U/L (ref 14–65)
Albumin: 4.1 g/dL (ref 3.6–5.1)
Alkaline Phosphatase: 90 U/L (ref 40–115)
Bilirubin, Direct: 0 mg/dL (ref 0.0–0.2)
Total Bilirubin: 0.4 mg/dL (ref 0.2–1.2)
Total Protein: 6.8 g/dL (ref 6.2–8.3)

## 2013-11-14 LAB — TSH: TSH: 0.65 m[IU]/L (ref 0.45–4.50)

## 2013-11-14 LAB — URINE DRUG SCREEN WITHOUT CONFIRMATION, STAT
Amphetamines UR, 1000 ng/mL Cutoff: NEGATIVE
Barbiturates UR, 300  ng/mL Cutoff: NEGATIVE
Benzodiazepines UR, 300 ng/mL Cutoff: POSITIVE
Cocaine UR, 300 ng/mL Cutoff: POSITIVE
MDMA URINE: NEGATIVE
Methadone, UR, 300 ng/mL Cutoff: NEGATIVE
Methamph, UR, 1000 ng/mL Cutoff: NEGATIVE
Opiates UR, 300 ng/mL Cutoff: POSITIVE
Oxycodone UR: NEGATIVE
Phencyclidine (PCP) UR, 25 ng/mL Cutoff: NEGATIVE
THC UR, 50 ng/mL Cutoff: NEGATIVE
Tricyclic Antidepressants Screen,Urine 1000 ng/mL Cutoff: NEGATIVE

## 2013-11-14 LAB — BASIC METABOLIC PANEL
Anion Gap: 12 mmol/L (ref 3–16)
BUN: 10 mg/dL (ref 7–25)
CO2: 29 mmol/L (ref 21–33)
Calcium: 8.8 mg/dL (ref 8.6–10.2)
Chloride: 105 mmol/L (ref 98–110)
Creatinine: 0.93 mg/dL (ref 0.50–1.30)
GFR MDRD Af Amer: 120 See note.
GFR MDRD Non Af Amer: 99 See note.
Glucose: 113 mg/dL (ref 65–99)
Osmolality, Calculated: 302 mOsm/kg (ref 278–305)
Potassium: 4.4 mmol/L (ref 3.5–5.3)
Sodium: 146 mmol/L (ref 135–146)

## 2013-11-14 LAB — CBC
Hematocrit: 42.2 % (ref 38.5–50.0)
Hemoglobin: 14.6 g/dL (ref 13.2–17.1)
MCH: 30.9 pg (ref 27.0–33.0)
MCHC: 34.6 g/dL (ref 32.0–36.0)
MCV: 89.1 fL (ref 80.0–100.0)
MPV: 9.6 fL (ref 7.5–11.5)
Platelets: 237 10*3/uL (ref 140–400)
RBC: 4.73 10*6/uL (ref 4.20–5.80)
RDW: 12.6 % (ref 11.0–15.0)
WBC: 5.6 10*3/uL (ref 3.8–10.8)

## 2013-11-14 LAB — DIFFERENTIAL
Basophils Absolute: 45 /uL (ref 0–200)
Basophils Relative: 0.8 % (ref 0.0–1.0)
Eosinophils Absolute: 106 /uL (ref 15–500)
Eosinophils Relative: 1.9 % (ref 0.0–8.0)
Lymphocytes Absolute: 1238 /uL (ref 850–3900)
Lymphocytes Relative: 22.1 % (ref 15.0–45.0)
Monocytes Absolute: 566 /uL (ref 200–950)
Monocytes Relative: 10.1 % (ref 0.0–12.0)
Neutrophils Absolute: 3646 /uL (ref 1500–7800)
Neutrophils Relative: 65.1 % (ref 40.0–80.0)

## 2013-11-14 LAB — LIPID PANEL
Cholesterol, Total: 112 mg/dL (ref 0–200)
HDL: 37 mg/dL (ref 30–60)
LDL Cholesterol: 59 mg/dL (ref 0–160)
Triglycerides: 78 mg/dL (ref 10–150)

## 2013-11-14 LAB — VITAMIN B12: Vitamin B-12: 483 pg/mL (ref 239–931)

## 2013-11-14 LAB — FOLATE: Folic Acid: 13.1 ng/mL (ref 2.76–17.00)

## 2013-11-14 LAB — T4, FREE: Free T4: 1.08 ng/dL (ref 0.61–1.76)

## 2013-11-14 LAB — HEMOGLOBIN A1C: Hemoglobin A1C: 5 % (ref 4.8–6.4)

## 2013-11-14 MED ORDER — LORazepam (ATIVAN) tablet 1 mg
1 | ORAL | Status: AC | PRN
Start: 2013-11-14 — End: 2013-11-15
  Administered 2013-11-14 – 2013-11-15 (×2): via ORAL

## 2013-11-14 MED ORDER — ziprasidone (GEODON) injection 20 mg
20 | INTRAMUSCULAR | Status: AC | PRN
Start: 2013-11-14 — End: 2013-11-15

## 2013-11-14 MED ORDER — hydrocortisone cream 2.5%
2.5 | Freq: Two times a day (BID) | TOPICAL | Status: AC
Start: 2013-11-14 — End: 2013-11-14

## 2013-11-14 MED ORDER — nicotine (polacrilex) (NICORETTE) gum 2 mg
2 | BUCCAL | Status: AC | PRN
Start: 2013-11-14 — End: 2013-11-17
  Administered 2013-11-14 – 2013-11-17 (×9): via ORAL

## 2013-11-14 MED ORDER — nicotine (NICODERM CQ) 21 mg/24 hr 1 patch
21 | Freq: Every day | TRANSDERMAL | Status: AC
Start: 2013-11-14 — End: 2013-11-17
  Administered 2013-11-14 – 2013-11-17 (×4): via TRANSDERMAL

## 2013-11-14 MED ORDER — multivitamin (THERAGRAN) tablet 1 tablet
Freq: Every day | ORAL | Status: AC
Start: 2013-11-14 — End: 2013-11-17
  Administered 2013-11-14 – 2013-11-17 (×4): via ORAL

## 2013-11-14 MED ORDER — hydrocortisone 1 % cream
1 | Freq: Two times a day (BID) | TOPICAL | Status: AC
Start: 2013-11-14 — End: 2013-11-17
  Administered 2013-11-14 – 2013-11-17 (×6): via TOPICAL

## 2013-11-14 MED ORDER — loperamide (IMODIUM) capsule 2 mg
2 | ORAL | Status: AC | PRN
Start: 2013-11-14 — End: 2013-11-17

## 2013-11-14 MED ORDER — promethazine (PHENERGAN) tablet 25 mg
25 | ORAL | Status: AC | PRN
Start: 2013-11-14 — End: 2013-11-17
  Administered 2013-11-15 – 2013-11-17 (×6): via ORAL

## 2013-11-14 MED ORDER — aluminum & magnesium hydroxide-simethicone (MYLANTA, MAALOX) suspension 15 mL
400-400-40 | Freq: Four times a day (QID) | ORAL | Status: AC | PRN
Start: 2013-11-14 — End: 2013-11-17

## 2013-11-14 MED ORDER — LORazepam (ATIVAN) 2 mg/mL injection
2 | Freq: Four times a day (QID) | INTRAMUSCULAR | Status: AC | PRN
Start: 2013-11-14 — End: 2013-11-15

## 2013-11-14 MED ORDER — bismuth subsalicylate (PEPTO BISMOL) 262 mg/15 mL suspension 30 mL
262 | ORAL | Status: AC | PRN
Start: 2013-11-14 — End: 2013-11-17

## 2013-11-14 MED ORDER — magnesium hydroxide (MILK OF MAGNESIA) Susp 10 mL
2400 | Freq: Two times a day (BID) | ORAL | Status: AC | PRN
Start: 2013-11-14 — End: 2013-11-17

## 2013-11-14 MED ORDER — ibuprofen (ADVIL,MOTRIN) tablet 600 mg
600 | Freq: Four times a day (QID) | ORAL | Status: AC | PRN
Start: 2013-11-14 — End: 2013-11-17
  Administered 2013-11-15 (×3): via ORAL

## 2013-11-14 MED ORDER — folic acid (FOLVITE) tablet 1 mg
1 | Freq: Every day | ORAL | Status: AC
Start: 2013-11-14 — End: 2013-11-17
  Administered 2013-11-14 – 2013-11-17 (×4): via ORAL

## 2013-11-14 MED ORDER — cloNIDine HCl (CATAPRES) tablet 0.1 mg
0.1 | Freq: Three times a day (TID) | ORAL | Status: AC | PRN
Start: 2013-11-14 — End: 2013-11-14
  Administered 2013-11-14: 20:00:00 via ORAL

## 2013-11-14 MED ORDER — cloNIDine HCl (CATAPRES) tablet 0.2 mg
0.2 | Freq: Three times a day (TID) | ORAL | Status: AC
Start: 2013-11-14 — End: 2013-11-17
  Administered 2013-11-15 – 2013-11-17 (×8): via ORAL

## 2013-11-14 MED ORDER — thiamine (VITAMIN B-1) tablet 100 mg
100 | Freq: Every day | ORAL | Status: AC
Start: 2013-11-14 — End: 2013-11-17
  Administered 2013-11-14 – 2013-11-17 (×4): via ORAL

## 2013-11-14 MED ORDER — zolpidem (AMBIEN) tablet 5 mg
5 | Freq: Every evening | ORAL | Status: AC | PRN
Start: 2013-11-14 — End: 2013-11-15
  Administered 2013-11-15: 03:00:00 via ORAL

## 2013-11-14 MED FILL — HYDROCORTISONE 2.5 % TOPICAL CREAM: 2.5 2.5 % | TOPICAL | Qty: 20

## 2013-11-14 MED FILL — THIAMINE HCL (VITAMIN B1) 100 MG TABLET: 100 100 MG | ORAL | Qty: 1

## 2013-11-14 MED FILL — FOLIC ACID 1 MG TABLET: 1 1 MG | ORAL | Qty: 1

## 2013-11-14 MED FILL — IBUPROFEN 600 MG TABLET: 600 600 MG | ORAL | Qty: 1

## 2013-11-14 MED FILL — CLONIDINE HCL 0.1 MG TABLET: 0.1 0.1 MG | ORAL | Qty: 1

## 2013-11-14 MED FILL — PROMETHAZINE 25 MG TABLET: 25 25 MG | ORAL | Qty: 1

## 2013-11-14 MED FILL — BISMUTH SUBSALICYLATE 262 MG/15 ML ORAL SUSPENSION: 262 262 mg/15 mL | ORAL | Qty: 30

## 2013-11-14 MED FILL — NICORELIEF 2 MG GUM: 2 2 mg | BUCCAL | Qty: 1

## 2013-11-14 MED FILL — NICOTINE 21 MG/24 HR DAILY TRANSDERMAL PATCH: 21 21 mg/24 hr | TRANSDERMAL | Qty: 1

## 2013-11-14 MED FILL — LORAZEPAM 1 MG TABLET: 1 1 MG | ORAL | Qty: 1

## 2013-11-14 MED FILL — ZOLPIDEM 5 MG TABLET: 5 5 MG | ORAL | Qty: 1

## 2013-11-14 MED FILL — CLONIDINE HCL 0.2 MG TABLET: 0.2 0.2 MG | ORAL | Qty: 1

## 2013-11-14 MED FILL — THERA 400 MCG TABLET: 400 400 mcg | ORAL | Qty: 1

## 2013-11-14 MED FILL — HYDROCORTISONE 1 % TOPICAL CREAM: 1 1 % | TOPICAL | Qty: 28

## 2013-11-14 NOTE — Unmapped (Signed)
Called report to [phone]

## 2013-11-14 NOTE — Unmapped (Signed)
Pt is 27 y/o WM whow has hx of depression and substance abuse. States his 2 brothers committed suicide and father has died since 2007-05-25. Both brothers had birthdays in Dec. He has lived briefly in [place]. [place] with mother and sister but was disowned for etoh and drug use . 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 Very depressed at this time and states he would have already killed himself but didn't want to put his family through this again.

## 2013-11-14 NOTE — Unmapped (Signed)
Insurance  [employer]  Subscriber # [id number]  [phone]  Faxed all clinical to [phone].

## 2013-11-14 NOTE — Unmapped (Signed)
[facility] Psychiatric Services    Informed Consent for Medication    [ Yes ] This Thereasa Parkin has counseled [name] on the following:     - Provided the patient/guardian the opportunity to discuss medications and ask questions regarding medication therapy.     - Discussed the risks and benefits of each medication with the patient/guardian.     - Explained the most common side effects of each medication to the patient/guardian.     - Explained the risks of neuroleptic malignant syndrome and/or tardive dyskinesia to the patient/guardian, if they apply.     - Encouraged the patient/guardian to report any side effects to the physician, nurse, or other staff member.     - Presented appropriate treatment alternatives to the patient/guardian.      [ Yes ]  This Thereasa Parkin has assessed Alexander [name] for the following:     - Patient/guardian understands medications may be titrated to effect.     - Determined the patient/guardian understands he/she has the right to accept or refuse medications (unless court-ordered or in emergency circumstances).     - Determined the patient has the capacity to give or withhold informed consent.    [ X ]  Patient has been determined to have capacity    [  ]  Patient has been determined to not have capacity    [  ]  Patient has a guardian      These medications were addressed with Alexander [name]    Scheduled Medications  ??? folic acid  1 mg Oral Daily 0900   ??? hydrocortisone   Topical BID   ??? multivitamin  1 tablet Oral Daily 0900   ??? nicotine  1 patch Transdermal Daily 0900   ??? thiamine  100 mg Oral Daily 0900       As Needed Medications  aluminum & magnesium hydroxide-simethicone, bismuth subsalicylate, cloNIDine HCl, ibuprofen, loperamide, LORazepam, magnesium hydroxide, nicotine (polacrilex), ziprasidone, zolpidem    Consent will be obtained should medication(s) not listed above be added.    Arly.Keller  ]  The patient/guardian agrees to the listed medications.  [  ]   The patient/guardian agrees to all medications but refuses to sign.  [  ]  The patient/guardian refuses all medications.      Provider: Edythe Lynn Signatures    I, Alexander [name], understand what has been discussed with me in regards to the medications listed above.  I agree to take the medications.    __________________________________________________________  Rachell Cipro, MD  11/14/13 1259

## 2013-11-14 NOTE — Unmapped (Signed)
[facility] Psychiatric Emergency  Service Evaluation    Chief complaint in patient's own words:: The main thing is trying to get off drugs but it's bringing my depression back.    Reason for Visit/Chief Complaint: Suicidal      Patient History     Context: intoxication  Location: Altered mental status of mood  Duration: one days.  Severity: severe .  Associated Symptoms: severe .  Modifying Factors: intoxication .    Past Psychiatric History: He has been in programs before following overdose    HPI  This is a 27 year-old single caucasian male who comes in feeling very depressed and feeling that he is on the verge of suicide.  He has attempted to hang himself in the past and his roommate saved him when he was hanging.  He came to [facility] following that.  He had been on Lithium and Klonopin following leaving the hospital and found that to be helpful for 4 months after he left the hospital.      At this time, he has just stopped working.  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  This seems to have depressed him.  Additionally it appears that his girlfriend whio is older is being very critical of him at this time.      He finds this time of the year very difficult because because December is the birth month of both of his brothers who were substance abusers.  One killed himself with an overdose and one shot himself while he was intoxicated.  This was 05/24/07. Father also died in 05/24/07 of seizures.    Social History:  Living with his older girlfriend and her three daughters.  Currently laid off from work at this time.     Family History   Problem Relation Age of Onset   ??? Drug abuse Brother    ??? Hypertension Mother    ??? Transient ischemic attack Mother    ??? Seizures Father    ??? Hypertension Father    ??? Cancer Father        Work History:  unemployed    History     Social History   ??? Marital Status: Single     Spouse Name: N/A     Number of Children: N/A   ??? Years of Education: N/A     Social  History Main Topics   ??? Smoking status: Current Every Day Smoker -- 2.00 packs/day for 11 years     Types: Cigarettes   ??? Smokeless tobacco: None   ??? Alcohol Use: 1.8 oz/week     3 Cans of beer per week      Comment: last drank 2 days ago   ??? Drug Use: Yes     Special: Cocaine, Heroin, Other-see comments, Marijuana      Comment: most recently using heroin   ??? Sexual Activity: Yes     Partners: Female, Male     Other Topics Concern   ??? Caffeine Use Yes   ??? Exercise No     Social History Narrative   ??? None          Past Medical History   Diagnosis Date   ??? Depression    ??? Drug abuse and dependence    ??? H/O tooth extraction    ??? Thyroid disease    ??? Substance induced mood disorder    ??? PTSD (post-traumatic stress disorder)    ??? Anxiety    ???  Bipolar disorder    ??? Bronchitis    ??? Liver disease      Patient unaware of actual dx       Previous Medications    IBUPROFEN (ADVIL,MOTRIN) 600 MG TABLET    Take 1 tablet (600 mg total) by mouth every 6 hours as needed for Pain.    IBUPROFEN (ADVIL,MOTRIN) 800 MG TABLET    Take 1 tablet (800 mg total) by mouth every 8 hours as needed for Pain or Fever.    LORAZEPAM (ATIVAN) 1 MG TABLET    Take 0.5 tablets (0.5 mg total) by mouth 2 times a day.    NICOTINE (NICODERM CQ) 14 MG/24 HR    Place 1 patch onto the skin daily.    OXYCODONE-ACETAMINOPHEN (PERCOCET) 5-325 MG PER TABLET    Take 1-2 tablets by mouth every 6 hours as needed for Pain.    RISPERIDONE (RISPERDAL M-TABS) 2 MG DISINTEGRATING TABLET    Take 1 tablet (2 mg total) by mouth at bedtime.       Allergies:   Allergies as of 11/14/2013   ??? (No Known Allergies)       Review of Systems     Review of Systems   Constitutional: Negative for activity change.   HENT: Negative for facial swelling.    Eyes: Negative for discharge.   Respiratory: Negative for apnea.    Cardiovascular: Negative for chest pain.   Gastrointestinal: Negative for abdominal distention.   Genitourinary: Negative for difficulty urinating.   Musculoskeletal:  Negative for arthralgias.   Skin: Negative for color change.   Neurological: Negative for dizziness.   Hematological: Negative for adenopathy.   Psychiatric/Behavioral: Negative for agitation.         Physical Exam/Objective Data     ED Triage Vitals   Vital Signs Group      Temp 11/14/13 0950 98.6 ??F (37 ??C)      Temp Source 11/14/13 0950 Oral      Heart Rate 11/14/13 0950 74      Heart Rate Source 11/14/13 0950 Automatic      Resp 11/14/13 0950 16      BP --       BP Location 11/14/13 0950 Left arm      BP Method 11/14/13 0950 Automatic      Patient Position 11/14/13 0950 Sitting   SpO2 11/14/13 0950 100 %   O2 Device 11/14/13 0950 None (Room air)       Physical Exam   Constitutional: He appears well-developed and well-nourished.   HENT:   Head: Normocephalic.   Eyes: Pupils are equal, round, and reactive to light.   Neck: Normal range of motion. Neck supple. No thyromegaly present.   Cardiovascular: Normal rate, regular rhythm, normal heart sounds and intact distal pulses.  Exam reveals no gallop and no friction rub.    No murmur heard.  Pulmonary/Chest: Effort normal and breath sounds normal.   Abdominal: Soft. Bowel sounds are normal.   He has a macular rash in the suprapubic area of his abdomen   Musculoskeletal: Normal range of motion.   Neurological: He is alert.   Skin: Skin is warm and dry.   He has a number of injection sites in the antecubital area of his arms.  I do not note tracks.  He states that he is careful not to inject in the same area.      He also has a number ol tatoos.  Mental Status Exam:     Gait and Muscle Strength:  Normal  Appearance and Behavior: Calm      Groomed  Speech: NL articulation, prosody, volume and production  Mood: dysphoric  Affect: appropriate  Thought Process and Associations: goal directed       No loose associations  Thought Content: suicidal  Perceptual Abnormalities: Auditory hallucinations  Orientation: person, place, time/date and situation  Memory/Attention:  recent, remote, and immediate recall intact  Abstraction: Attention and concentration intact  Fund of Knowledge: average  Insight and Judgement: Partial     Fair      Labs:    Please see electronic medical record for any tests performed in the ED.      Radiology:    Please see electronic medical record for any studies performed in the ED.    Emergency Course and Plan     Alexander [name] is a 27 y.o. male who presented to the emergency department with Suicidal       Patient is having a lot of issues with sadness over not working.  This is the anniversary of the birthdate of 2 brothers to whom he was quite close.  He is having trouble with his girlfriend.He is suicidal and requires brief admission    Diagnosis:    Axis I: Mood Disorder.  Opiate use disorder.  Alcohol use disorder    Axis II: Deferred     Axis III: nione    Axis IV: ongoing use of heroin    Axis V: 31-40 impairment in reality testing/comm. or major functional impairment      Disposition:      Admit     @SWRPT @        Philis Kendall, MD  11/14/13 1250

## 2013-11-14 NOTE — Unmapped (Signed)
Pt visible on unit.  POD Dr. Arnoldo Morale notified for ativan clarification order.  Pt irritable about parameters surrounding ativan.  Pt stated, i have to be acting wild and crazy to get the ativan.  Pt requested and was given ambien, pt currently sitting in activity room watching tv.

## 2013-11-14 NOTE — Unmapped (Signed)
Problem: Major Mood Disorder  Goal: Mania or depressed mood won???t interfere with daily function  Intervention: Assess for target symptoms & provide for safety  D-pt. Out on unit. C/o w/d. Note fine tremors. Med. With ativan 1mg  at 1645. Given nicotine gum 2mg  at 1800.

## 2013-11-14 NOTE — Unmapped (Addendum)
[facility]  Psychiatric Social Worker Assessment Consult Note      Alexander [name]    [id number]    Chief complaint in patient's own words:: The main thing is trying to get off drugs but it's bringing my depression back.    Clinicians's description of presenting problem: Pt depressed with SI and seeking both MH & sub abuse tx.      History:  History of Present Illness:   Pt is a 27 y/o WSM w/ self-reported hx of PTSD, depression, & substance abuse brought to [facility] by [facility] after pt went to the police station and reported SI.  Pt presents sad and tearful.  Says that this is a difficult time of the year because both of his older brothers successfully committed suicide & have bdays in December.  Reports that his father passed in 2011 and he misses his dad during the holidays as well. Pt reports that he has been actively using IV heroine and etoh daily since about mid April.  States that he was prescribed Lithium, Klonopin, & an unk antidepressant x 4 mos while sober/clean prior to this.  Says that this medication regimen helped with his mood, calmed him down, & motivated him.  Reports that he has been attempting to stop use on his own & was seen in [facility] yesterday for withdrawal sxs.  States that this morning he called multiple substance abuse tx programs but was unsuccessful in getting into tx.  Pt reports decreased motivation and feelings of worthlessness.  Requests to get back on psych meds and assistance to get into both mental health and substance abuse tx.  Still voicing SI at this time.      Psychiatric History:   Reports multiple psych & substance abuse admits in [place]  Last PES visit 02/17/13    Family History:  Older brother ([name]) - hx of depression, successful suicide in 2008 of OD  Older brother ([name]) - hx of Bipolar, successful suicide in 2009 by gun  Mother & older sister - hx of depression    Chemical Dependence History:  Hx of cocaine (crack) - denies current use  Hx of MJ - says he uses  this if it's around  etoh daily - 12 pk beer daily  Heroine IV - up to a gram a day  Pain pills (percocet) - ocassionally  Hx of tx in [place]    Social History, Support System and Current Living Situation:   Originally from [place], raised by both parents, youngest of 4 children, 2 older brothers deceased, 1 older sister lives in [place], [place], father passed of seizure related complications in 2011, mother moved to [place] after his death to live with pt's sister, pt reports that he moved to [place] at that time & recently moved back to [facility] in 02/2013, living with 25 y/o gf and her 3 teenage girls at this time, 11th grade ed - dropped out of school his 12th grade year after his brother successfully committed suicide (2008), 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  Reports that pt called her and her mom this morning, saying that he  wasn't doing well.  Says that pt talks to their mother more than he talks to her.  Spoke w/ pt's mother, Alexander [name], at same phone #.  She says that pt reported that he was all tensed up, quit drinking, and needed help.  Reports that pt wouldn't tell her if he was suicidal bc his 2 brothers successfully committed suicide.  She says that she prays to God that he does not want to kill himself.  She believes that pt gets more depressed in the winter bc he doesn't work as much and pt thinks of those who have passed.    Mental Status Exam:     Appearance and Behavior  Apparent Age: Appears Actual Age  Eye Contact:  (good eye contact)  Appear/Hygiene: Disheveled (glasses, goatee)  Patient Behaviors: Tearful;Anxious;Cooperative;Hopeless  Level of Alertness: Alert    Motor /  Speech  Speech: Logical/coherent    Affect / Thought  Affect: Anxious;Sad;Depressed  Patient's Reported Mood: sick worthless  Mood congruent with affect?: Yes  Thought Content: Suicidal Ideation  Thought Processes: Organized  Perception: Appropriate  Intelligence: Average  Insight: poor        Risk Factors/Stress Factors:  Stress Factors  Patient Stress Factors: Exhausted;Financial concerns;Health changes;Loss of control  Risk Factors  Assault Risk Assessment: No risk factors present  Self Harm/Suicidal Ideation Plan: SI, feelings of worthlessness  Previous Self Harm/Suicidal Plans: hx of intentional OD on illicit drugs - cocaine, heroine, percocet, & etoh in 10/2011  Family Suicide History: Yes (2 brothers with successful suicides)  Current Plans of  Homicide or to Harm Another : denies  Previous Plans of Homicide or to Harm Another: denies  Restraint Contraindications: None      Formulation and Plan:   Pt here due to SI & increased depression.  Pt attempting to stop etoh and drug use.  Requesting tx and medication eval.  Still voicing SI and there is potential risk due to sex, age, depression, previous attempt, etoh/drug use, lack of social supports, & family hx of successful attempts.  Will review w/ dr.

## 2013-11-14 NOTE — Unmapped (Signed)
Problem: Major Mood Disorder  Goal: Mania or depressed mood won???t interfere with daily function  Intervention: Assess for target symptoms & provide for safety  F-Admission at 1400  D-27 yr. Old wm admitted from [facility]. Admitted with SIMD.Pt . With h/o depression. Denies sui./hom. Ideation. Denies a/v halluc. Pt. Drinks at least 12 pack  everyday. Uses heroin and has used morphine and crack in the past. Pt. Stated he went to police station to bring him here for detox. Pt.'s oldest brother died of OD in 05-04-07. Second brother died 05-03-2008 self inflicted gunshot. Bother brothers birthdays are this month. Best friend died in 05/03/09 from hanging. Father died in 2009/05/03 (seizure). Brothers and father with drug history. Pt. Describes family as dysfuctional. Per [facility] pt. Lives with gf and her 3 teenage daughters. Pt. Has ODI charge pending. Has lost 16lbs in last 3 weeks.  A-1:1. Oriented to unit. Medicated with clonidine 0.1mg  at 1453 for c/o withdrawal. Note fine tremors. Skin dry.  R-washed clothes and took shower.

## 2013-11-15 LAB — HEPATITIS C ANTIBODY: HCV Ab: REACTIVE — AB

## 2013-11-15 LAB — HIV 1+2 ANTIBODY/ANTIGEN WITH REFLEX: HIV 1+2 AB/AGN: NONREACTIVE

## 2013-11-15 LAB — SYPHILIS MONITORING (RPR W TITER): RPR Monitoring Screen: NONREACTIVE

## 2013-11-15 MED ORDER — LORazepam (ATIVAN) tablet 1 mg
1 | ORAL | Status: AC | PRN
Start: 2013-11-15 — End: 2013-11-17
  Administered 2013-11-15 – 2013-11-16 (×4): via ORAL

## 2013-11-15 MED ORDER — cloNIDine HCl (CATAPRES) tablet 0.1 mg
0.1 | Freq: Two times a day (BID) | ORAL | Status: AC | PRN
Start: 2013-11-15 — End: 2013-11-17
  Administered 2013-11-15: 16:00:00 via ORAL

## 2013-11-15 MED ORDER — LORazepam (ATIVAN) 2 mg/mL injection
2 | INTRAMUSCULAR | Status: AC | PRN
Start: 2013-11-15 — End: 2013-11-17

## 2013-11-15 MED ORDER — ziprasidone (GEODON) injection 20 mg
20 | INTRAMUSCULAR | Status: AC | PRN
Start: 2013-11-15 — End: 2013-11-17

## 2013-11-15 MED ORDER — hydrOXYzine pamoate (VISTARIL) capsule 25 mg
25 | ORAL | Status: AC | PRN
Start: 2013-11-15 — End: 2013-11-17

## 2013-11-15 MED ORDER — LORazepam (ATIVAN) tablet 2 mg
1 | ORAL | Status: AC | PRN
Start: 2013-11-15 — End: 2013-11-17

## 2013-11-15 MED ORDER — risperiDONE M-TAB (RISPERDAL M-TABS) disintegrating tablet 1 mg
1 | Freq: Two times a day (BID) | ORAL | Status: AC | PRN
Start: 2013-11-15 — End: 2013-11-17
  Administered 2013-11-16 – 2013-11-17 (×2): via ORAL

## 2013-11-15 MED ORDER — lithium carbonate capsule 300 mg
300 | Freq: Two times a day (BID) | ORAL | Status: AC
Start: 2013-11-15 — End: 2013-11-17
  Administered 2013-11-15 – 2013-11-17 (×4): via ORAL

## 2013-11-15 MED ORDER — traZODone (DESYREL) tablet 50 mg
50 | Freq: Every evening | ORAL | Status: AC | PRN
Start: 2013-11-15 — End: 2013-11-17
  Administered 2013-11-16: 02:00:00 via ORAL

## 2013-11-15 MED ORDER — citalopram (CELEXA) tablet 10 mg
10 | Freq: Every day | ORAL | Status: AC
Start: 2013-11-15 — End: 2013-11-16
  Administered 2013-11-15 – 2013-11-16 (×2): via ORAL

## 2013-11-15 MED ORDER — hydrOXYzine pamoate (VISTARIL) capsule 50 mg
50 | Freq: Three times a day (TID) | ORAL | Status: AC | PRN
Start: 2013-11-15 — End: 2013-11-17
  Administered 2013-11-15 – 2013-11-17 (×7): via ORAL

## 2013-11-15 MED ORDER — traZODone (DESYREL) tablet 50 mg
50 | Freq: Every evening | ORAL | Status: AC
Start: 2013-11-15 — End: 2013-11-16
  Administered 2013-11-16: 02:00:00 via ORAL

## 2013-11-15 MED FILL — HYDROXYZINE PAMOATE 50 MG CAPSULE: 50 50 MG | ORAL | Qty: 1

## 2013-11-15 MED FILL — FOLIC ACID 1 MG TABLET: 1 1 MG | ORAL | Qty: 1

## 2013-11-15 MED FILL — LORAZEPAM 1 MG TABLET: 1 1 MG | ORAL | Qty: 1

## 2013-11-15 MED FILL — PROMETHAZINE 25 MG TABLET: 25 25 MG | ORAL | Qty: 1

## 2013-11-15 MED FILL — CLONIDINE HCL 0.2 MG TABLET: 0.2 0.2 MG | ORAL | Qty: 1

## 2013-11-15 MED FILL — NICOTINE 21 MG/24 HR DAILY TRANSDERMAL PATCH: 21 21 mg/24 hr | TRANSDERMAL | Qty: 1

## 2013-11-15 MED FILL — CLONIDINE HCL 0.1 MG TABLET: 0.1 0.1 MG | ORAL | Qty: 1

## 2013-11-15 MED FILL — IBUPROFEN 600 MG TABLET: 600 600 MG | ORAL | Qty: 1

## 2013-11-15 MED FILL — LITHIUM CARBONATE 300 MG CAPSULE: 300 300 MG | ORAL | Qty: 1

## 2013-11-15 MED FILL — NICORELIEF 2 MG GUM: 2 2 mg | BUCCAL | Qty: 1

## 2013-11-15 MED FILL — THIAMINE HCL (VITAMIN B1) 100 MG TABLET: 100 100 MG | ORAL | Qty: 1

## 2013-11-15 MED FILL — CITALOPRAM 10 MG TABLET: 10 10 MG | ORAL | Qty: 1

## 2013-11-15 MED FILL — THERA 400 MCG TABLET: 400 400 mcg | ORAL | Qty: 1

## 2013-11-15 MED FILL — TRAZODONE 50 MG TABLET: 50 50 MG | ORAL | Qty: 1

## 2013-11-15 NOTE — Unmapped (Addendum)
Problem: Major Mood Disorder  Goal: Mania or depressed mood won???t interfere with daily function  Intervention: Assess for target symptoms & provide for safety  Patient has a flat, depressed affect.  Patient states he was using heroin and alcohol prior to admission. He stated he was anxious and his stomach hurt.  His CIWA was 7 at 1600.  He denies having suicidal ideation at this time and contacts for safety. He is being monitored for safety every 15 minutes.  Intervention: Dispense prescribed medication per MD order  Patient's CIWA at 1600 was 7.  He stated he was feeling anxious.  He was given Vistaril 50 mg po at 1718.  He was medication complaint. Patient's CIWA score was 11 at 2000. He was given Ativan 1 mg po at 2017 and he stated it really helped him.      Problem: Major Mood Disorder  Goal: Mania or depressed mood won???t interfere with daily function  He has a depressed, flat affect-says he is wanting to get off heroin and alcohol.

## 2013-11-15 NOTE — Unmapped (Signed)
[facility]                                    Social Work Inpatient Psychiatry Summary      Patient name: Alexander [name]     Patient MRN: [id number]  DOB: 1986/08/14  Age: 27 y.o.   Gender: male      Date of admission: 11/14/2013    Circumstances leading to hospitalization: Pt has a hx of Depression and substance abuse [alcohol and heroin daily]  He went to the police station and told them he needed to go the [facility].  Pt taken to [facility]. Trans to [facility].      Admission Information: Pt originally from [place]. 2 parents together until his father died in 05-25-10. Mother Alexander [name]  moved to [place] to live with her daughter.[Alexander [name]]  Youngest of 4 sibs. 2 brothers committed suicide one [Alexander [name]] in 26-May-2007 from OD and one [Alexander [name]] in 25-May-2008 from gunshot. Pt also moved to [place]. Had psych and SA treatment while there. Pt's mother and sister have disowned him due to drug and alcohol addiction. He moved back to [place] in March 2014. December is hard on the pt.  Both of his brothers' birthdays were in Dec. Currently lives with a 41 YO GF and her three teenage daughters. 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 Has financial problems.  Works where he can to make money to support his heroin and alcohol addiction. He is tired of spending money to support his habit. Heroin, alcohol, hx of MJ, and cocaine. Has been using IV heroin and alcohol daily since April.   Wants to detox and go to treatment facility.  Pt reported he had been on Lithium, Klonopin, and antidepressant and had good response.  Dr Vincente Poli will start psych meds.           Patient Information       How do you wish to be addressed?: Alexander [name] or Alexander [name]    Guardian Type: None    Number of children and their names: none     Patient's Sexual Orientation: Heterosexual    Any aspects of your orientation that will help [place] provide better care for you:    Patient Education Level:  (dropped out  after brother kiilled himself)    Patient Income Source: Unemployed  Does Glass blower/designer which is seasonal        Safety         Self Injurious Thoughts: Plan for self injurious actions (Comment) went to police station suicidal and needed to detox    Self Injurious Behaviors:  (baracaded room door and att to make noose from gown)    Thoughts of Harming Others: Denies    Harmful Actions Toward Others: None observed    Any safety concerns:     Support System          Significant Other: GF     Other Support, Name and relationship: mother and sister    Mental Health Agency:  no    Psychiatrist:  no    Legal Status          Patient Mental Health Legal Status: Involuntary    Alcohol/Drug History Past 12 Months      Are you currently using drugs/alcohol?: Yes alcohol 12 pk a day and heroin daily  Hx of MJ and crack    Referral to CD?: Yes    Abuse/Trauma History         Abuse History: Denies    Is anyone in the home being abused or neglected?: No    Trauma/Loss History: 2 brothers suicided and father died between 2007-05-03 1nd May 02, 2010     Spiritual          Religion: Don't know    Cultural   27 YO SWM     Collateral Information          Collateral Information: Alexander [name]  [phone] mother                                       Alexander [name]  [phone] sister                                            Formulation of Plan            Formulation of Plan: assessment                                    Collateral contact information                                     Team treatment planning                                     D/C planning

## 2013-11-15 NOTE — Unmapped (Signed)
I spent a total of 45 minutes interviewing patient and discussing with MS.  I agree with assessment and plan outlined by resident.    Please see my daily notes    Hjalmer Iovino M.D.

## 2013-11-15 NOTE — Unmapped (Addendum)
[facility]  INPATIENT PSYCHIATRY DISCHARGE SUMMARY    Patient: Alexander [name]  Date of Admission: 11/14/2013  Date of Discharge: 11/17/2013  Attending Physician(s): Wirick  Unit:  [facility]  Resident Physician(s): Larence Penning  Medical Students: Dorthula Perfect, Reino Kent MSIII    Admission Diagnoses:  Axis I: Mood Disorder; Opiate use disorder; Alcohol use disorder  Axis II: Deferred  Axis III: Transaminitis  Axis IV: Significant stressors in primary support  Axis V: GAF 31-40    Discharge Diagnoses:  Axis I: mood Disorder nos Opiate and alcohol use disorder  Axis II: defer  Axis III: transaminitis  Axis IV: ongoing use of opiates  Axis V: GAF 55     Alexander [name], Alexander [name]   Home Medication Instructions [id number]    Printed on:11/19/13 1643   Medication Information                      citalopram (CELEXA) 20 MG tablet  Take 1 tablet (20 mg total) by mouth daily.             ibuprofen (ADVIL,MOTRIN) 600 MG tablet  Take 1 tablet (600 mg total) by mouth every 6 hours as needed for Pain.                     Is the patient prescribed multiple antipsychotics? No.    Allergies:   No Known Allergies    Consults:  Social work, occupational therapy, recreational therapy    Imaging:   None    Therapeutic Interventions:   Interpersonal, supportive, group, psychopharmacologic, and unit milieu.    Reason for Admission:   27 y.o.yo male admitted on 11/14/2013 for SI in the setting of heroin and alcohol withdrawal.    Per PES Notes:   Pt is a 27 y/o WSM w/ self-reported hx of PTSD, depression, & substance abuse brought to [facility] by [facility] after pt went to the police station and reported SI. Pt presents sad and tearful. Says that this is a difficult time of the year because both of his older brothers successfully committed suicide & have bdays in December. Reports that his father passed in 2011 and he misses his dad during the holidays as well. Pt reports that he has been actively using IV heroine and etoh daily since about mid  April. States that he was prescribed Lithium, Klonopin, & an unk antidepressant x 4 mos while sober/clean prior to this. Says that this medication regimen helped with his mood, calmed him down, & motivated him. Reports that he has been attempting to stop use on his own & was seen in [facility] yesterday for withdrawal sxs. States that this morning he called multiple substance abuse tx programs but was unsuccessful in getting into tx. Pt reports decreased motivation and feelings of worthlessness. Requests to get back on psych meds and assistance to get into both mental health and substance abuse tx. Still voicing SI at this time.       Hospital Course:  The patient was admitted to the inpatient psychiatry and evaluated  by each member of an interdisciplinary treatment team and an individualized treatment plan was formulated with the patient's full participation.  In addition, it should be noted the patient did eventually provide informed consent for the administration of psychotropic medications and that this informed consent process included a thorough discussion of risks, benefits, side-effects, and alternatives. The patient was admitted on a 72-hour hold.      On 11/15/13,  patient barricaded self into room and began to fashion noose out of hospital gown. He was placed in open seclusion and dressed in paper hospital gown for his own safety. It appears to me that this not a serious situation and the patient was trying to obtain more meds by showing this behavior.  I was on the weekend after this occurred and I did evaluate him and I felt that he was not real dangerous.  I emphasized to the patient that he should come to [place] and let us know if he were on the verge of doing something dangerous.     In spite of my encouragement, patient continued to act out in a manner that was drug seeking.  On the day of discharge, the patient demanded transfer because he stated that there was a knot on his chest.  What he pointed to was his  sternum.  He would not accept my explanation and demanded discharge.  He denied that he was suicidal. I encouraged him to stay but he insisted upon leaving.  He stated that he would still seek out a rehab program.  He arranged for a ride within an hour of deciding upon discharge.  It did appear that he had been planning his discharge for some time.  He did seem upbeat as he was leaving. He thanked for [employer] him and assured me that he was going into a program.      I did stop lithium because I did not feel that he was manic and I was concerned about followup.      1. Mood Disorder NOS    2. Opiate use disorder    3. Alcohol use disorder  CIWAs 4,5 over 12/11 -12/12.    MSE On Discharge:   Filed Vitals:    11/15/13 0700   BP: 143/88   Pulse: 102   Temp: 97.2 ??F (36.2 ??C)   Resp: 18   SpO2:      Appearance and Behavior: appears stated age, within normal limits for weight, grooming good, wearing causal attire, no psychomotor retardation or agitation, cooperative, good eye contact.  Movement and Abnormal Muscle movements:  no tics, tremors, rigidity, or evidence of EPS or TD.  Speech and Language: clear articulation, no aphasia, non-pressured. rate, production, volume and tone are all within normal limits.  Mood/Affect: ok affect euthymic, full range, content appropriate   Thought Process and Associations: goal directed and linear, no flight of ideas, no loosening of associations.   HI/SI: no SI. No HI Contracts for safety.  Content: No delusions, obsessions, or compulsions expressed  Perceptual Abnormalities: Denies AH/VH and does not appear to be responding to internal stimuli.  Orientation: person, place, time/date and situation   Memory/Attention: grossly intact to recent events and conversation  Fund of Knowledge: average   Insight and Judgment: fair, fair          Pertinent Labs:   Recent Results (from the past 1344 hour(s))   BASIC METABOLIC PANEL    Collection Time     11/13/13  2:30 PM       Result Value  Range    Sodium 141  135 - 146 mmol/L    Potassium 4.2  3.5 - 5.3 mmol/L    Chloride 103  98 - 110 mmol/L    CO2 26  21 - 33 mmol/L    Anion Gap 12  3 - 16 mmol/L    BUN 10  7 - 25 mg/dL  Creatinine 0.91  0.50 - 1.30 mg/dL    Glucose 94  65 - 99 mg/dL    Calcium 9.5  8.6 - 16.1 mg/dL    GFR MDRD Af Amer 096      GFR MDRD Non Af Amer 102      Osmolality, Calculated 291  278 - 305 mOsm/kg   MAGNESIUM    Collection Time     11/13/13  2:30 PM       Result Value Range    Magnesium 1.8  1.5 - 2.5 mg/dL   CBC    Collection Time     11/13/13  2:30 PM       Result Value Range    WBC 8.2  3.8 - 10.8 10E3/uL    RBC 5.29  4.20 - 5.80 10E6/uL    Hemoglobin 16.1  13.2 - 17.1 g/dL    Hematocrit 04.5  40.9 - 50.0 %    MCV 88.8  80.0 - 100.0 fL    MCH 30.4  27.0 - 33.0 pg    MCHC 34.2  32.0 - 36.0 g/dL    RDW 81.1  91.4 - 78.2 %    Platelets 269  140 - 400 10E3/uL    MPV 9.2  7.5 - 11.5 fL   DIFFERENTIAL    Collection Time     11/13/13  2:30 PM       Result Value Range    Neutrophils Relative 77.8  40.0 - 80.0 %    Lymphocytes Relative 13.9 (*) 15.0 - 45.0 %    Monocytes Relative 7.3  0.0 - 12.0 %    Eosinophils Relative 0.4  0.0 - 8.0 %    Basophils Relative 0.6  0.0 - 1.0 %    Neutrophils Absolute 6380  1500 - 7800 /uL    Lymphocytes Absolute 1140  850 - 3900 /uL    Monocytes Absolute 599  200 - 950 /uL    Eosinophils Absolute 33  15 - 500 /uL    Basophils Absolute 49  0 - 200 /uL   VENOUS BLOOD GAS, LINE/SYRINGE    Collection Time     11/13/13  2:30 PM       Result Value Range    PH-Line Draw 7.37  7.32 - 7.42    PCO2-Line Draw 51  41 - 51 mm Hg    PO2-Line Draw 28  25 - 40 mm Hg    HCO3-Line Draw 29 (*) 24 - 28 mmol/L    CO2 Content-Line Draw 31 (*) 25 - 29 mmol/L    Base Excess-Line Draw 3.1 (*) -2.0 - 3.0 mmol/L    %HBO2-Line Draw 47.8  40.0 - 70.0 %    Carboxyhgb-Line Draw 3.1 (*) 0.0 - 2.0 %    Methemoglobin-Line Draw 0.5  0.0 - 1.5 %    Reduced Hemoglobin-Line Draw 48.6 (*) 0.0 - 5.0 %   URINALYSIS-MACROSCOPIC W/REFLEX TO  MICROSCOPIC    Collection Time     11/13/13  6:21 PM       Result Value Range    Color, UA Yellow  Yellow,Straw    Clarity, UA Clear  Clear    Specific Gravity, UA >=1.030  1.005 - 1.035    pH, UA 6.0  5.0 - 8.0    Protein, UA 30 mg/dL (*) Negative mg/dL    Glucose, UA Negative  Negative mg/dL    Ketones, UA 15 mg/dL (*) Negative mg/dL    Bilirubin, UA Small (*) Negative  Blood, UA Negative  Negative    Nitrite, UA Negative  Negative    Urobilinogen, UA 0.2 E.U./dL  0.2 - 1.0 EU/dL    Leukocytes, UA Negative  Negative   URINE CULTURE    Collection Time     11/13/13  6:21 PM       Result Value Range    Culture Result 1,000-10,000 cfu/mL      Culture Result Mixed Skin/Urogenital Flora.  No Further Workup.     URINALYSIS, MICROSCOPIC    Collection Time     11/13/13  6:21 PM       Result Value Range    RBC, UA 5 (*) 0 - 3 /HPF    WBC, UA 3  0 - 5 /HPF    Bacteria, UA Rare (*) None Seen /HPF    Mucus, UA Present (*) None Seen /HPF   URINE DRUG SCREEN, STAT    Collection Time     11/14/13 12:56 PM       Result Value Range    Cocaine UR, 300 ng/mL Cutoff Presumptive Positive (*) Negative    Opiates UR, 300 ng/mL Cutoff Presumptive Positive (*) Negative    Methamph, UR, 1000 ng/mL Cutoff Negative  Negative    THC UR, 50 ng/mL Cutoff Negative  Negative    Amphetamines UR, 1000 ng/mL Cutoff Negative  Negative    Phencyclidine (PCP) UR, 25 ng/mL Cutoff Negative  Negative    Benzodiazepines UR, 300 ng/mL Cutoff Presumptive Positive (*) Negative    Barbiturates UR, 300  ng/mL Cutoff Negative  Negative    Methadone, UR, 300 ng/mL Cutoff Negative  Negative    Oxycodone UR Negative  Negative    MDMA URINE Negative  Negative    TCA UR, 1000 ng/mL Cutoff Negative  Negative   CBC    Collection Time     11/14/13  1:45 PM       Result Value Range    WBC 5.6  3.8 - 10.8 10E3/uL    RBC 4.73  4.20 - 5.80 10E6/uL    Hemoglobin 14.6  13.2 - 17.1 g/dL    Hematocrit 47.8  29.5 - 50.0 %    MCV 89.1  80.0 - 100.0 fL    MCH 30.9  27.0 - 33.0 pg     MCHC 34.6  32.0 - 36.0 g/dL    RDW 62.1  30.8 - 65.7 %    Platelets 237  140 - 400 10E3/uL    MPV 9.6  7.5 - 11.5 fL   DIFFERENTIAL    Collection Time     11/14/13  1:45 PM       Result Value Range    Neutrophils Relative 65.1  40.0 - 80.0 %    Lymphocytes Relative 22.1  15.0 - 45.0 %    Monocytes Relative 10.1  0.0 - 12.0 %    Eosinophils Relative 1.9  0.0 - 8.0 %    Basophils Relative 0.8  0.0 - 1.0 %    Neutrophils Absolute 3646  1500 - 7800 /uL    Lymphocytes Absolute 1238  850 - 3900 /uL    Monocytes Absolute 566  200 - 950 /uL    Eosinophils Absolute 106  15 - 500 /uL    Basophils Absolute 45  0 - 200 /uL   BASIC METABOLIC PANEL    Collection Time     11/14/13  1:45 PM       Result Value Range    Sodium  146  135 - 146 mmol/L    Potassium 4.4  3.5 - 5.3 mmol/L    Chloride 105  98 - 110 mmol/L    CO2 29  21 - 33 mmol/L    Anion Gap 12  3 - 16 mmol/L    BUN 10  7 - 25 mg/dL    Creatinine 4.78  2.95 - 1.30 mg/dL    Glucose 621 (*) 65 - 99 mg/dL    Calcium 8.8  8.6 - 30.8 mg/dL    GFR MDRD Af Amer 657      GFR MDRD Non Af Amer 99      Osmolality, Calculated 302  278 - 305 mOsm/kg   HEMOGLOBIN A1C    Collection Time     11/14/13  1:45 PM       Result Value Range    Hemoglobin A1C 5.0  4.8 - 6.4 %   LIPID PANEL    Collection Time     11/14/13  1:45 PM       Result Value Range    Cholesterol, Total 112  0 - 200 mg/dL    Triglycerides 78  10 - 150 mg/dL    HDL 37  30 - 60 mg/dL    LDL Cholesterol 59  0 - 160 mg/dL   HEPATIC FUNCTION PANEL    Collection Time     11/14/13  1:45 PM       Result Value Range    Total Bilirubin 0.4  0.2 - 1.2 mg/dL    Bilirubin, Direct 0.0  0.0 - 0.2 mg/dL    AST 846 (*) 14 - 65 U/L    ALT 390 (*) 13 - 69 U/L    Alkaline Phosphatase 90  40 - 115 U/L    Total Protein 6.8  6.2 - 8.3 g/dL    Albumin 4.1  3.6 - 5.1 g/dL   FOLATE    Collection Time     11/14/13  1:45 PM       Result Value Range    Folic Acid 13.10  2.76 - 17.00 ng/mL   T4, FREE    Collection Time     11/14/13  1:45 PM        Result Value Range    Free T4 1.08  0.61 - 1.76 ng/dL   TSH    Collection Time     11/14/13  1:45 PM       Result Value Range    TSH 0.65  0.45 - 4.50 mIU/L   VITAMIN B12    Collection Time     11/14/13  1:45 PM       Result Value Range    Vitamin B-12 483  239 - 931 pg/mL       Radiology: No results found.    Condition and Disposition:   Stable to home.    Outpatient Followup:   We have referred him to Residential rehab, but he refused to stay in the hosptial to go there from the hospital.

## 2013-11-15 NOTE — Unmapped (Signed)
[facility]  Chemical Dependency Consultation       Patient name: Alexander [name]  DOB: 1986-07-22  MRN: [id number]    Alcohol/Drug History in past 12 months   Have you used any substance (prescription drugs, non prescription drugs,  inhalants, bath salts, etc.) other than directed or abused alcohol in the past 12 months?: Yes    Illegal drug use/abuse past 12 months: Yes    Prescription drug abuse 12 months: Yes    Non-prescription abuse in last 12 months: Yes    Any alcohol abuse in last 12 months: Yes    IV DRUG USE: Yes    Used chemicals (other than as prescribed) in the past 12 months?: Yes          Chemicals Used:       Type of Other Chemical Used: Heroin       Type of Other Chemical Used: Alcohol                                                                    Current Alcohol and drug treatment history: Residential    Current Treatment Facility: nnone    Past alcohol/drug treatment programs: Detox;Residential    Past [facility] Facilities: [facility], [facility]    Any family history of substance abuse problems?: yes    Toxicology screen completed?: Yes (Make comment)    Other factors: IV drug user    Other Addictive Behaviors: cigs    Legal Information                           Strengths and needs for treatment  See progress note.    Treatment Recommendations and needs

## 2013-11-15 NOTE — Unmapped (Signed)
Problem: Major Mood Disorder  Goal: Mania or depressed mood won???t interfere with daily function  Intervention: Assess for target symptoms & provide for safety  Patient continues to endorse SI.  CFS.  Moved patient's room closer to nurses station for closer observation.  Q 15 minute checks continuous for safety.  Intervention: Provide reassurance and orient to reality  Patient is AXOX4.  1:1 individual interaction/support and encouragement given.     Intervention: Dispense prescribed medication per MD order  Medication compliant.  Intervention: Address personal hygiene and care  Appears clean in hospital pants and own shirt.

## 2013-11-15 NOTE — Unmapped (Signed)
Problem: Major Mood Disorder  Goal: Mania or depressed mood won???t interfere with daily function  Intervention: Assess for target symptoms & provide for safety  Asleep at long intervals throughout the night with no c/o pain. CIWA-4 pts at 0200 due to elevated b/p,  given ativan 1 mg po at that time. No agitated/bizarre behavior this shift. Monitored every 15 minutes to maintain pt/milieu safety.

## 2013-11-15 NOTE — Unmapped (Signed)
Occupational Therapy/Therapeutic Recreation Progress Note      Alexander [name]  [id number]      Group: Task  Attendance: Attended  60/60 minutes  Level of Participation: Moderate, Interacted with peer(s) and Interacted with staff    Pt. Readily attended group upon invitation.  Pt. Was pleasant and cooperative.  Pt. With subdued affect.  Pt. Listened to music with headphones and stated to staff member that he enjoys music.  Pt. Engaged in positive interactions with staff and peer.  When another peer was leaving due to discharge, patient initiated goodbye and take care to peer.  Pt. Was able to voice what kind of music he likes to listen to and interacted with staff member regarding music choices.      Tatyanna Cronk MARIE Marcelles Clinard, OTR/L  11/15/2013 3:13 PM

## 2013-11-15 NOTE — Unmapped (Signed)
Problem: Major Mood Disorder  Goal: Mania or depressed mood won???t interfere with daily function  Outcome: Not Progressing  barricaded self in room.  Staff had to push door open.  Able to follow staff to special care area, made aware the door would remain open.  Within 10 minutes patient was noted to be gone from special care area.  Found patient in his room tieing his hospital gown around the upper right bed side rail in attempt to harm self.  Patient unable to CFS.  Escorted by security and staff to special care area.  Clothing taken away and provided with paper gown for safety.  Patient in special care area at this time.  Will continue to monitor.

## 2013-11-15 NOTE — Unmapped (Signed)
Problem: Major Thought Disorder  Goal: Thought disorder will not interfere with daily functioning  Intervention: Document symptoms in progress notes  See daily notes      Intervention: Provide Psychotherapy  See daily notes  Intervention: Prescribe and monitor medication as appropriate  See daily notes  Intervention: Monitor compliance with meds for side effects  See daily notes  Intervention: Other psychotherapeutic interventions (describe)  See daily notes      Problem: Major Mood Disorder  Goal: Mania or depressed mood won???t interfere with daily function  See daily notes  Intervention: Other psychotherapeutic interventions (describe)  See daily notes      Problem: Potential for Harm to Self or Others  Goal: Patient will not present active danger to themselves due to  See daily notes  Intervention: Increase obs level (requires reassessment & doc q8hr)  See daily notes  Intervention: Complete FTF assessment post seclusion &/or restraints  See daily notes  Intervention: Ensure seclusion a/o restraint order is completed Martyn Ehrich  See daily notes  Intervention: Other psychotherapeutic interventions (describe)  See daily notes

## 2013-11-15 NOTE — Unmapped (Signed)
[facility] Psychiatry  Initial Psychiatric Evaluation    Alexander [name]  [id number]    Chief Complaint: I was having suicidal thoughts because I was trying to quit alcohol and drugs       Per PES Notes:  Pt is a 27 y/o WSM w/ self-reported hx of PTSD, depression, & substance abuse brought to [facility] by [facility] after pt went to the police station and reported SI. Pt presents sad and tearful. Says that this is a difficult time of the year because both of his older brothers successfully committed suicide & have bdays in December. Reports that his father passed in 2011 and he misses his dad during the holidays as well. Pt reports that he has been actively using IV heroine and etoh daily since about mid April. States that he was prescribed Lithium, Klonopin, & an unk antidepressant x 4 mos while sober/clean prior to this. Says that this medication regimen helped with his mood, calmed him down, & motivated him. Reports that he has been attempting to stop use on his own & was seen in [facility] yesterday for withdrawal sxs. States that this morning he called multiple substance abuse tx programs but was unsuccessful in getting into tx. Pt reports decreased motivation and feelings of worthlessness. Requests to get back on psych meds and assistance to get into both mental health and substance abuse tx. Still voicing SI at this time.      Subjective:  Patient is a 27 y.o. male who presents with depressed mood and SI in setting of substance abuse.  Patient was admitted on a 72h    Primary complaints include: alcohol abuse, heroin abuse, difficulty sleeping, feeling depressed and feeling suicidal.  Onset of symptoms was gradual starting 8 months ago with gradually worsening course since that time.      Today treatment team met with patient in seclusion room. Per patient report, he had had a bad episode due to feeling very depressed earlier this morning during which he barricaded himself into his room and attempted to make a noose  out of his hospital gown, which prompted his placement in the seclusion room. He stated he hadn't gotten any sleep last night, was feeling very exhausted, and wasn't thinking straight. When asked about his current mood, he said that he is quite tired of the world. The deaths of his two brothers via suicide as well as his deceased father have been weighing heavily on his mind. He feels that his primary problem is his substance abuse, with heroin being his drug of choice; he desires to stop using because he has bills that need to be paid and I can't be spending all my money on drugs anymore. He also indicated that he wanted to be placed back on psychiatric medications (lithium, trazodone, an antidepressant, Klonopin); he had previously gone through substance abuse treatment in [place] and had achieved a year of sobriety and mood stability. At this time he felt that he was doing very well, but when he moved back to [place] in March 2014 for work he ran out of medications, which he feels prompted resuming alcohol and heroin abuse. He has been off meds since mid-April 2014. During the interview, he was made aware of the availability of medication to help his withdrawal symptoms, although he did not verbalize that he was experiencing any symptoms currently. He was able to converse with the treatment team well and seemed agreeable to the plan. He did not display any signs of  agitation at this time. He denied any HI, AVH, delusions, or paranoia at this time.    It appears that he has had severe SA his entire adult life and has to resort to unsavory practice to maintain habit.  Reports 100-150 dollar a day habit.  Longest period of sobriety 14 months.  He has had mood symptoms when sober.  Recently was sober until moving back to [place]       Past Psychiatric History:   Pt reports he has struggled with depression since childhood  Reports multiple psych & substance abuse admits in [place]   Last PES  visit 02/17/13  Prior dx: depression, bipolar disorder  Med trials: lithium, unspecified antidepressant, Klonopin, trazodone  Outpt: no current  Suicide attempts: hanging, OD      Substance Abuse History:  Tobacco: current smoker, 2ppd x 11 yrs  Hx of cocaine (crack) - denies current use   Hx of MJ - says he uses this if it's around   EtOH daily - 12 pk beer daily; drinks to augment the effect of heroin  Heroine IV - at least 1 gram a day, started at age 75  Pain pills (Percocet) - ocassionally, prefers heroin because it is cheaper  Hx of rehab in [place]. Longest period of sobriety was one year and 19 days from end of 2012 to end of 2013       Past Medical History   Diagnosis Date   ??? Depression    ??? Drug abuse and dependence    ??? H/O tooth extraction    ??? Thyroid disease    ??? Substance induced mood disorder    ??? PTSD (post-traumatic stress disorder)    ??? Anxiety    ??? Bipolar disorder    ??? Bronchitis    ??? Liver disease      Patient unaware of actual dx      Past Surgical History   Procedure Laterality Date   ??? Mandible surgery           Family History   Problem Relation Age of Onset   ??? Drug abuse Brother    ??? Hypertension Mother    ??? Transient ischemic attack Mother    ??? Seizures Father    ??? Hypertension Father    ??? Cancer Father    Depression in mother, sister, and brother  BAD in brother   + h/o completed suicide x 2 (OD, self-inflicted GSW)      Social/Developmental History:  Living with his older girlfriend and her three daughters in [place]. No children of his own.  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 ibuprofen (ADVIL,MOTRIN) 600 MG tablet Take 1 tablet (600 mg total) by mouth every 6 hours as needed for Pain.  30 tablet   0   ??? ibuprofen (ADVIL,MOTRIN) 800 MG tablet Take 1 tablet (800 mg total) by mouth every 8 hours as needed for Pain or Fever.  30 tablet  2   ??? LORazepam (ATIVAN) 1 MG tablet Take 0.5 tablets (0.5 mg total) by mouth 2 times a day.  3 tablet  0   ??? nicotine (NICODERM CQ) 14 mg/24 hr Place 1 patch  onto the skin daily.  28 patch  0   ??? oxyCODONE-acetaminophen (PERCOCET) 5-325 mg per tablet Take 1-2 tablets by mouth every 6 hours as needed for Pain.  20 tablet  0   ??? risperiDONE (RISPERDAL M-TABS) 2 MG disintegrating tablet Take 1 tablet (2 mg total) by mouth at bedtime.         No Known Allergies     Physical Review Of Systems:  Constitutional: negative for fevers and positive for fatigue, sweats  Eyes: negative for icterus and irritation  Ears, nose, mouth, throat, and face: negative for epistaxis, sore throat and tinnitus  Respiratory: negative for cough and dyspnea on exertion  Cardiovascular: negative for chest pain and palpitations  Gastrointestinal: positive for nausea and negative for vomiting, diarrhea  Genitourinary:negative for hematuria and urinary incontinence  Hematologic/lymphatic: negative for easy bruising and lymphadenopathy  Musculoskeletal:negative for stiff joints and positive for muscle cramps  Neurological: negative for dizziness, headaches and tremors  Skin: negative for pruritis, rash    Objective:  Vital signs in last 24 hours:  Temp:  [97.2 ??F (36.2 ??C)-98.6 ??F (37 ??C)] 97.2 ??F (36.2 ??C)  Heart Rate:  [74-102] 102  Resp:  [16-18] 18  BP: (132-151)/(78-95) 143/88 mmHg    Physical exam performed in PES; sig for macular rash in the suprapubic area; number of injection sites in the antecubital area of his arms bilat, no track marks.      Mental Status Evaluation:  Appearance:  Appears stated age, weight WNL, decent eye contact, fair hygiene, in paper gown   Behavior:  calm and cooperative, open historian, no PMA/PMR, no tics or tremors   Speech:  volume/rate/production/tone WNL; good  articulation; fluent; non-pressured   Mood:  depressed   Affect:  Euthymic, constricted, no lability   Thought Process:  Logical, organized, no LOA or FOI   Thought Content:  Denies current SI/HI. No overt delusions   Sensorium:  alert and oriented x4. No RTIS   Memory: intact   Cognition:  grossly intact   Insight:  limited                      Judgment:  limited                       Language: vocab appropriate for age     Fund of Knowledge: Seemingly average       Labs  Recent Results (from the past 72 hour(s))   BASIC METABOLIC PANEL    Collection Time     11/13/13  2:30 PM       Result Value Range    Sodium 141  135 - 146 mmol/L    Potassium 4.2  3.5 - 5.3 mmol/L    Chloride 103  98 - 110 mmol/L    CO2 26  21 - 33 mmol/L    Anion Gap 12  3 - 16 mmol/L    BUN 10  7 - 25 mg/dL    Creatinine 4.09  8.11 - 1.30 mg/dL    Glucose 94  65 - 99 mg/dL    Calcium 9.5  8.6 - 91.4 mg/dL    GFR MDRD Af Amer 782      GFR MDRD Non Af Amer 102      Osmolality, Calculated 291  278 - 305 mOsm/kg   MAGNESIUM    Collection Time     11/13/13  2:30 PM  Result Value Range    Magnesium 1.8  1.5 - 2.5 mg/dL   CBC    Collection Time     11/13/13  2:30 PM       Result Value Range    WBC 8.2  3.8 - 10.8 10E3/uL    RBC 5.29  4.20 - 5.80 10E6/uL    Hemoglobin 16.1  13.2 - 17.1 g/dL    Hematocrit 16.1  09.6 - 50.0 %    MCV 88.8  80.0 - 100.0 fL    MCH 30.4  27.0 - 33.0 pg    MCHC 34.2  32.0 - 36.0 g/dL    RDW 04.5  40.9 - 81.1 %    Platelets 269  140 - 400 10E3/uL    MPV 9.2  7.5 - 11.5 fL   DIFFERENTIAL    Collection Time     11/13/13  2:30 PM       Result Value Range    Neutrophils Relative 77.8  40.0 - 80.0 %    Lymphocytes Relative 13.9 (*) 15.0 - 45.0 %    Monocytes Relative 7.3  0.0 - 12.0 %    Eosinophils Relative 0.4  0.0 - 8.0 %    Basophils Relative 0.6  0.0 - 1.0 %    Neutrophils Absolute 6380  1500 - 7800 /uL    Lymphocytes Absolute 1140  850 - 3900 /uL    Monocytes Absolute 599  200 - 950 /uL    Eosinophils Absolute 33  15 -  500 /uL    Basophils Absolute 49  0 - 200 /uL   VENOUS BLOOD GAS, LINE/SYRINGE    Collection Time     11/13/13  2:30 PM       Result Value Range    PH-Line Draw 7.37  7.32 - 7.42    PCO2-Line Draw 51  41 - 51 mm Hg    PO2-Line Draw 28  25 - 40 mm Hg    HCO3-Line Draw 29 (*) 24 - 28 mmol/L    CO2 Content-Line Draw 31 (*) 25 - 29 mmol/L    Base Excess-Line Draw 3.1 (*) -2.0 - 3.0 mmol/L    %HBO2-Line Draw 47.8  40.0 - 70.0 %    Carboxyhgb-Line Draw 3.1 (*) 0.0 - 2.0 %    Methemoglobin-Line Draw 0.5  0.0 - 1.5 %    Reduced Hemoglobin-Line Draw 48.6 (*) 0.0 - 5.0 %   URINALYSIS-MACROSCOPIC W/REFLEX TO MICROSCOPIC    Collection Time     11/13/13  6:21 PM       Result Value Range    Color, UA Yellow  Yellow,Straw    Clarity, UA Clear  Clear    Specific Gravity, UA >=1.030  1.005 - 1.035    pH, UA 6.0  5.0 - 8.0    Protein, UA 30 mg/dL (*) Negative mg/dL    Glucose, UA Negative  Negative mg/dL    Ketones, UA 15 mg/dL (*) Negative mg/dL    Bilirubin, UA Small (*) Negative    Blood, UA Negative  Negative    Nitrite, UA Negative  Negative    Urobilinogen, UA 0.2 E.U./dL  0.2 - 1.0 EU/dL    Leukocytes, UA Negative  Negative   URINE CULTURE    Collection Time     11/13/13  6:21 PM       Result Value Range    Culture Result 1,000-10,000 cfu/mL      Culture Result Mixed Skin/Urogenital Flora.  No Further  Workup.     URINALYSIS, MICROSCOPIC    Collection Time     11/13/13  6:21 PM       Result Value Range    RBC, UA 5 (*) 0 - 3 /HPF    WBC, UA 3  0 - 5 /HPF    Bacteria, UA Rare (*) None Seen /HPF    Mucus, UA Present (*) None Seen /HPF   URINE DRUG SCREEN, STAT    Collection Time     11/14/13 12:56 PM       Result Value Range    Cocaine UR, 300 ng/mL Cutoff Presumptive Positive (*) Negative    Opiates UR, 300 ng/mL Cutoff Presumptive Positive (*) Negative    Methamph, UR, 1000 ng/mL Cutoff Negative  Negative    THC UR, 50 ng/mL Cutoff Negative  Negative    Amphetamines UR, 1000 ng/mL Cutoff Negative  Negative    Phencyclidine  (PCP) UR, 25 ng/mL Cutoff Negative  Negative    Benzodiazepines UR, 300 ng/mL Cutoff Presumptive Positive (*) Negative    Barbiturates UR, 300  ng/mL Cutoff Negative  Negative    Methadone, UR, 300 ng/mL Cutoff Negative  Negative    Oxycodone UR Negative  Negative    MDMA URINE Negative  Negative    TCA UR, 1000 ng/mL Cutoff Negative  Negative   CBC    Collection Time     11/14/13  1:45 PM       Result Value Range    WBC 5.6  3.8 - 10.8 10E3/uL    RBC 4.73  4.20 - 5.80 10E6/uL    Hemoglobin 14.6  13.2 - 17.1 g/dL    Hematocrit 16.1  09.6 - 50.0 %    MCV 89.1  80.0 - 100.0 fL    MCH 30.9  27.0 - 33.0 pg    MCHC 34.6  32.0 - 36.0 g/dL    RDW 04.5  40.9 - 81.1 %    Platelets 237  140 - 400 10E3/uL    MPV 9.6  7.5 - 11.5 fL   DIFFERENTIAL    Collection Time     11/14/13  1:45 PM       Result Value Range    Neutrophils Relative 65.1  40.0 - 80.0 %    Lymphocytes Relative 22.1  15.0 - 45.0 %    Monocytes Relative 10.1  0.0 - 12.0 %    Eosinophils Relative 1.9  0.0 - 8.0 %    Basophils Relative 0.8  0.0 - 1.0 %    Neutrophils Absolute 3646  1500 - 7800 /uL    Lymphocytes Absolute 1238  850 - 3900 /uL    Monocytes Absolute 566  200 - 950 /uL    Eosinophils Absolute 106  15 - 500 /uL    Basophils Absolute 45  0 - 200 /uL   BASIC METABOLIC PANEL    Collection Time     11/14/13  1:45 PM       Result Value Range    Sodium 146  135 - 146 mmol/L    Potassium 4.4  3.5 - 5.3 mmol/L    Chloride 105  98 - 110 mmol/L    CO2 29  21 - 33 mmol/L    Anion Gap 12  3 - 16 mmol/L    BUN 10  7 - 25 mg/dL    Creatinine 9.14  7.82 - 1.30 mg/dL    Glucose 956 (*) 65 - 99 mg/dL  Calcium 8.8  8.6 - 10.2 mg/dL    GFR MDRD Af Amer 161      GFR MDRD Non Af Amer 99      Osmolality, Calculated 302  278 - 305 mOsm/kg   HEMOGLOBIN A1C    Collection Time     11/14/13  1:45 PM       Result Value Range    Hemoglobin A1C 5.0  4.8 - 6.4 %   LIPID PANEL    Collection Time     11/14/13  1:45 PM       Result Value Range    Cholesterol, Total 112  0 - 200 mg/dL     Triglycerides 78  10 - 150 mg/dL    HDL 37  30 - 60 mg/dL    LDL Cholesterol 59  0 - 160 mg/dL   HEPATIC FUNCTION PANEL    Collection Time     11/14/13  1:45 PM       Result Value Range    Total Bilirubin 0.4  0.2 - 1.2 mg/dL    Bilirubin, Direct 0.0  0.0 - 0.2 mg/dL    AST 096 (*) 14 - 65 U/L    ALT 390 (*) 13 - 69 U/L    Alkaline Phosphatase 90  40 - 115 U/L    Total Protein 6.8  6.2 - 8.3 g/dL    Albumin 4.1  3.6 - 5.1 g/dL   FOLATE    Collection Time     11/14/13  1:45 PM       Result Value Range    Folic Acid 13.10  2.76 - 17.00 ng/mL   T4, FREE    Collection Time     11/14/13  1:45 PM       Result Value Range    Free T4 1.08  0.61 - 1.76 ng/dL   TSH    Collection Time     11/14/13  1:45 PM       Result Value Range    TSH 0.65  0.45 - 4.50 mIU/L   VITAMIN B12    Collection Time     11/14/13  1:45 PM       Result Value Range    Vitamin B-12 483  239 - 931 pg/mL       Diagnoses:  Axis I: Mood Disorder NOS: r/o BAD mre depressed vs SIMD; Polysubstance Use Disorder  Axis II: Deferred  Axis III: Transaminitis, opioid and alcohol withdrawal  Axis IV: occupational problems, problems related to social environment and chronic substance abuse  Axis V: 11-20 some danger of hurting self or others possible OR occasionally fails to maintain minimal personal hygiene OR gross impairment in communication    Assessment and Plan:    Assessment: 27yo caucasian male self-reported h/o depression/bipolar disorder presents with depressed mood and SI in setting of substance abuse. Suicidal behavior on the unit this morning. Requires cont'd hospitalization for safety and stabilization.     Plan:   Psychiatric   Safety--cont to monitor per unit protocol; currently in seclusion due to suicidal behavior    Legal--hold expires 12/15     Medications--start lithium 300mg  BID wc             Celexa 10mg  daily                        Trazodone 50mg  qhs    Substance-- UDS+ for BZD, opioids, cocaine; CD consult; opioid w/d meds (clonidine  0.2mg   TID + BID PRN, Phenergan, loperamide, ibuprofen); CIWA (lorazepam 1mg  score >10, lorazepam 2mg  score >16)    Medical   Transaminitis--possibly alcohol induced, but w/ IVDA will check Hep C, RPR, HIV    Social: SW consulted, appreciate their assistance.     Dispo: pending resolution of current issues and safety.       York Spaniel, DO PGY-2  [phone]    Pt seen and plan discussed with attending, Dr. Shela Commons. Vincente Poli    11/15/2013    I spent a total of 45 minutes interviewing patient and discussing with residents.  I agree with assessment and plan outlined by resident.   Note addended where necessary     Cassie Freer M.D.

## 2013-11-15 NOTE — Unmapped (Signed)
Ibuprofen 600 mg po c/o generalized discomfort and phenergan 25 mg po c/o nausea

## 2013-11-15 NOTE — Unmapped (Signed)
I spent a total of 20 minutes reviewing patient and discussing with resident.  I agree with assessment and plan outlined by resident.       Ashlin Hidalgo M.D.

## 2013-11-15 NOTE — Unmapped (Signed)
PSYCHIATRY PROGRESS NOTE    Alexander [name] [id number]     Date/time of admission: 11/14/2013  9:50 AM Length of Stay: 1    Subjective:   Patient was seen and examined. Treatment team met with patient in seclusion room. Per patient report, he had had a bad episode earlier this morning during which he barricaded himself into his room and attempted to make a noose out of his hospital gown, which prompted his placement in the seclusion room. When asked about his mood, he said that he is quite tired of the world. He reports than he has been suicidal in the past as well. He feels that his primary problem is is substance abuse, with heroin being his drug of choice; however he has bills that need to be paid and I can't be spending all my money on drugs anymore. He also indicated that he wanted to be placed back on psychiatric medications; he had previously gone through substance abuse treatment in [place] and had achieved a year of sobriety and medication compliance. At this time he felt that he was doing very well, but when he moved back to [place] in March 2014 he ran out of medications, which he feels prompted resuming alcohol and heroin abuse. During the interview, he was made aware of the availability of medication to help his withdrawal symptoms, although he did not verbalize that he was experiencing any symptoms currently. He was able to converse with the treatment team well and seemed agreeable to the plan. He did not display any signs of agitation at this time. He denied any HI, AVH, delusions, or paranoia at this time.    Behavior issues in last 24hours: see staff RN/notes    Reviewed staff/RN notes.    D-pt. Out on unit. C/o w/d. Note fine tremors. Med. With ativan 1mg  at 1645. Given nicotine gum 2mg  at 1800.    Pt visible on unit. POD Dr. Arnoldo Morale notified for ativan clarification order. Pt irritable about parameters surrounding ativan. Pt stated, i have to be acting wild and crazy to get  the ativan. Pt requested and was given ambien, pt currently sitting in activity room watching tv.     Asleep at long intervals throughout the night with no c/o pain. CIWA-4 pts at 0200 due to elevated b/p, given ativan 1 mg po at that time. No agitated/bizarre behavior this shift. Monitored every 15 minutes to maintain pt/milieu safety.    Ibuprofen 600 mg po c/o generalized discomfort and phenergan 25 mg po c/o nausea    CIWA-5 points, pt requesting but CIWA is not greater than 10. Then asked for clonidine, but it is scheduled and not prn. Pt received phenergan 2 hours ago. Encouraged to talk with physician.    Patient assessed in seclusion room this morning and unable to contract for safety, stating you can't leave me alone. Patient states he has significant depressed mood and thoughts of self-harm. Patient is also acutely withdrawing from heroin, complaining of acute increase in suicidal ideation, shaking, diaphoresis, and nausea.   Per RN, patient given clonidin this morning. Patient attempted to barricade himself in a room and wouldn't let staff enter, patient attempted to create noose out of his hospital gown, and unable to contract for safety.   Patient requires seclusion at this time due to high-risk for self-harm in the setting of acute heroin withdrawal and multiple attempts to harm himself on the unit this morning.     barricaded self in room. Staff  had to push door open. Able to follow staff to special care area, made aware the door would remain open. Within 10 minutes patient was noted to be gone from special care area. Found patient in his room tieing his hospital gown around the upper right bed side rail in attempt to harm self. Patient unable to CFS. Escorted by security and staff to special care area. Clothing taken away and provided with paper gown for safety. Patient in special care area at this time. Will continue to monitor.      PMH/SH/FH reviewed and changes noted.    Current Medications  ordered:  ??? citalopram  10 mg Oral Daily 0900   ??? cloNIDine HCl  0.2 mg Oral TID   ??? folic acid  1 mg Oral Daily 0900   ??? hydrocortisone   Topical BID   ??? lithium carbonate  300 mg Oral BID WC   ??? multivitamin  1 tablet Oral Daily 0900   ??? nicotine  1 patch Transdermal Daily 0900   ??? thiamine  100 mg Oral Daily 0900   ??? traZODone  50 mg Oral Nightly (2100)     aluminum & magnesium hydroxide-simethicone, bismuth subsalicylate, cloNIDine HCl, ibuprofen, loperamide, LORazepam, LORazepam, LORazepam, magnesium hydroxide, nicotine (polacrilex), promethazine, ziprasidone, zolpidem    OBJECTIVE:  Vitals:  .  Filed Vitals:    11/14/13 2027 11/15/13 0200 11/15/13 0658 11/15/13 0700   BP: 145/95 143/93 132/93 143/88   Pulse: 95 99 90 102   Temp:  98.2 ??F (36.8 ??C) 98 ??F (36.7 ??C) 97.2 ??F (36.2 ??C)   TempSrc:       Resp:  18 18 18    Height:       Weight:       SpO2:           ROS: Denies CP, SOB, n/v/d/c, HA, dizziness    Mental Status Exam:   Appearance/Behavior: dressed in paper hospital gown, NAD  Orientation: alert, oriented to person, place, purpose  Speech: reg rate/rhythm/prosody/vol/production, nonpressured, fluent, naming and repeating intact; no aphasia  Mood/Affect: Quite tired of this world/mood congruent  Thought Process: linear and logical, goal-oriented, no LOA, no FOI  Thought Content: Positive for SI, denies HI  Perceptions: denies AH/VH, not RTIS  Memory: recent and remote intact   Attention span: fair  Fund of Knowledge: average  Insight/Judgement: fair/limited    Labs (past 24 hours):  Recent Results (from the past 24 hour(s))   URINE DRUG SCREEN, STAT    Collection Time     11/14/13 12:56 PM       Result Value Range    Cocaine UR, 300 ng/mL Cutoff Presumptive Positive (*) Negative    Opiates UR, 300 ng/mL Cutoff Presumptive Positive (*) Negative    Methamph, UR, 1000 ng/mL Cutoff Negative  Negative    THC UR, 50 ng/mL Cutoff Negative  Negative    Amphetamines UR, 1000 ng/mL Cutoff Negative  Negative     Phencyclidine (PCP) UR, 25 ng/mL Cutoff Negative  Negative    Benzodiazepines UR, 300 ng/mL Cutoff Presumptive Positive (*) Negative    Barbiturates UR, 300  ng/mL Cutoff Negative  Negative    Methadone, UR, 300 ng/mL Cutoff Negative  Negative    Oxycodone UR Negative  Negative    MDMA URINE Negative  Negative    TCA UR, 1000 ng/mL Cutoff Negative  Negative   CBC    Collection Time     11/14/13  1:45 PM  Result Value Range    WBC 5.6  3.8 - 10.8 10E3/uL    RBC 4.73  4.20 - 5.80 10E6/uL    Hemoglobin 14.6  13.2 - 17.1 g/dL    Hematocrit 78.4  69.6 - 50.0 %    MCV 89.1  80.0 - 100.0 fL    MCH 30.9  27.0 - 33.0 pg    MCHC 34.6  32.0 - 36.0 g/dL    RDW 29.5  28.4 - 13.2 %    Platelets 237  140 - 400 10E3/uL    MPV 9.6  7.5 - 11.5 fL   DIFFERENTIAL    Collection Time     11/14/13  1:45 PM       Result Value Range    Neutrophils Relative 65.1  40.0 - 80.0 %    Lymphocytes Relative 22.1  15.0 - 45.0 %    Monocytes Relative 10.1  0.0 - 12.0 %    Eosinophils Relative 1.9  0.0 - 8.0 %    Basophils Relative 0.8  0.0 - 1.0 %    Neutrophils Absolute 3646  1500 - 7800 /uL    Lymphocytes Absolute 1238  850 - 3900 /uL    Monocytes Absolute 566  200 - 950 /uL    Eosinophils Absolute 106  15 - 500 /uL    Basophils Absolute 45  0 - 200 /uL   BASIC METABOLIC PANEL    Collection Time     11/14/13  1:45 PM       Result Value Range    Sodium 146  135 - 146 mmol/L    Potassium 4.4  3.5 - 5.3 mmol/L    Chloride 105  98 - 110 mmol/L    CO2 29  21 - 33 mmol/L    Anion Gap 12  3 - 16 mmol/L    BUN 10  7 - 25 mg/dL    Creatinine 4.40  1.02 - 1.30 mg/dL    Glucose 725 (*) 65 - 99 mg/dL    Calcium 8.8  8.6 - 36.6 mg/dL    GFR MDRD Af Amer 440      GFR MDRD Non Af Amer 99      Osmolality, Calculated 302  278 - 305 mOsm/kg   HEMOGLOBIN A1C    Collection Time     11/14/13  1:45 PM       Result Value Range    Hemoglobin A1C 5.0  4.8 - 6.4 %   LIPID PANEL    Collection Time     11/14/13  1:45 PM       Result Value Range    Cholesterol, Total 112   0 - 200 mg/dL    Triglycerides 78  10 - 150 mg/dL    HDL 37  30 - 60 mg/dL    LDL Cholesterol 59  0 - 160 mg/dL   HEPATIC FUNCTION PANEL    Collection Time     11/14/13  1:45 PM       Result Value Range    Total Bilirubin 0.4  0.2 - 1.2 mg/dL    Bilirubin, Direct 0.0  0.0 - 0.2 mg/dL    AST 347 (*) 14 - 65 U/L    ALT 390 (*) 13 - 69 U/L    Alkaline Phosphatase 90  40 - 115 U/L    Total Protein 6.8  6.2 - 8.3 g/dL    Albumin 4.1  3.6 - 5.1 g/dL   FOLATE  Collection Time     11/14/13  1:45 PM       Result Value Range    Folic Acid 13.10  2.76 - 17.00 ng/mL   T4, FREE    Collection Time     11/14/13  1:45 PM       Result Value Range    Free T4 1.08  0.61 - 1.76 ng/dL   TSH    Collection Time     11/14/13  1:45 PM       Result Value Range    TSH 0.65  0.45 - 4.50 mIU/L   VITAMIN B12    Collection Time     11/14/13  1:45 PM       Result Value Range    Vitamin B-12 483  239 - 931 pg/mL       Imaging: no new imaging.    Axis I: Mood Disorder. Opiate use disorder. Alcohol use disorder   Axis II: Deferred   Axis III: none   Axis IV: ongoing use of heroin   Axis V: 31-40 impairment in reality testing/comm. or major functional impairment      ASSESSMENT AND PLAN: 27 y.o. male admitted on 11/14/2013 for suicidality and substance abuse.    Psychiatric:  1. Safety  - Continue to monitor per unit protocol. Patient currently actively suicidal and placed in seclusion.  2. Hold  - Expires 12/16.  3. Medications  - Start lithium 300 mg BID  - Start Celexa 10 mg QD  - Start trazodone 50 mg QHS for sleep   4. Substance  - Continue CIWA protocol for alcohol withdrawal  - Continue clonidine 0.2 mg TID for opiate withdrawal    Medical:   # Elevated liver enzymes  -  AST 233, ALT 390, alk phos 90  - Check hepatitis panel    Social: SW consulted, appreciate their assistance.    Hold Status: Expires 12/17    Dispo: Pending resolution of current issues and safety.    Francetta Found, MS III  Patient seen and plan discussed and agreed upon by  resident and attending physician.

## 2013-11-15 NOTE — Unmapped (Signed)
CD NOTE: I met with pt to complete drug and alcohol assessment. Pt was alert and attentive. Pt reports using Heroin daily 1 gram. Alcohol daily 12 pack. LDU 11/13/13. Pt has prior treatment in the past. Discussed treatment options such as residential treatment. Pt stated that he wanted to attend residential treatment. Writer informed pt that he will need to participate in groups etc over the weekend. Writer will fax paperwork on Monday to [facility] for review.

## 2013-11-15 NOTE — Unmapped (Signed)
CIWA-5 points, pt requesting but CIWA is not greater than 10. Then asked for clonidine, but it is scheduled and not prn. Pt received phenergan 2 hours ago. Encouraged to talk with physician.

## 2013-11-15 NOTE — Unmapped (Signed)
Patient assessed in seclusion room this morning and unable to contract for safety, stating you can't leave me alone.  Patient states he has significant depressed mood and thoughts of self-harm.  Patient is also acutely withdrawing from heroin, complaining of acute increase in suicidal ideation, shaking, diaphoresis, and nausea.      Per RN, patient given clonidin this morning.  Patient attempted to barricade himself in a room and wouldn't let staff enter, patient attempted to create noose out of his hospital gown, and unable to contract for safety.      Patient requires seclusion at this time due to high-risk for self-harm in the setting of acute heroin withdrawal and multiple attempts to harm himself on the unit this morning.     Restraint / Seclusion Assessment Note     1. Indication: Self-harm behavior  2. Kadan current status: Seclusion    3. Physical status: mild distress, no evidence of acute injury related to seclusion and/or restraint  4. Pertinent mental status exam: : blunted affect, constricted mobility, depressed mood with SI   5. Medications given (see MAR): clonidine  6. Was restraint/seclusion appropriate and the least restrictive means to safely address the behavior? Yes   7. At this time does @FNAME @ continues to need restraint/seclusion as the least restrictive measure? Yes    Jacques Navy, PGY-1  Pager [phone]

## 2013-11-16 MED ORDER — citalopram (CELEXA) tablet 20 mg
20 | Freq: Every day | ORAL | Status: AC
Start: 2013-11-16 — End: 2013-11-17
  Administered 2013-11-17: 13:00:00 via ORAL

## 2013-11-16 MED ORDER — traZODone (DESYREL) tablet 100 mg
100 | Freq: Every evening | ORAL | Status: AC
Start: 2013-11-16 — End: 2013-11-17
  Administered 2013-11-17: 01:00:00 via ORAL

## 2013-11-16 MED FILL — THERA 400 MCG TABLET: 400 400 mcg | ORAL | Qty: 1

## 2013-11-16 MED FILL — PROMETHAZINE 25 MG TABLET: 25 25 MG | ORAL | Qty: 1

## 2013-11-16 MED FILL — NICORELIEF 2 MG GUM: 2 2 mg | BUCCAL | Qty: 1

## 2013-11-16 MED FILL — LITHIUM CARBONATE 300 MG CAPSULE: 300 300 MG | ORAL | Qty: 1

## 2013-11-16 MED FILL — CLONIDINE HCL 0.2 MG TABLET: 0.2 0.2 MG | ORAL | Qty: 1

## 2013-11-16 MED FILL — HYDROXYZINE PAMOATE 50 MG CAPSULE: 50 50 MG | ORAL | Qty: 1

## 2013-11-16 MED FILL — FOLIC ACID 1 MG TABLET: 1 1 MG | ORAL | Qty: 1

## 2013-11-16 MED FILL — LORAZEPAM 1 MG TABLET: 1 1 MG | ORAL | Qty: 1

## 2013-11-16 MED FILL — THIAMINE HCL (VITAMIN B1) 100 MG TABLET: 100 100 MG | ORAL | Qty: 1

## 2013-11-16 MED FILL — CITALOPRAM 10 MG TABLET: 10 10 MG | ORAL | Qty: 1

## 2013-11-16 MED FILL — RISPERIDONE 1 MG DISINTEGRATING TABLET: 1 1 MG | ORAL | Qty: 1

## 2013-11-16 MED FILL — NICOTINE 21 MG/24 HR DAILY TRANSDERMAL PATCH: 21 21 mg/24 hr | TRANSDERMAL | Qty: 1

## 2013-11-16 MED FILL — TRAZODONE 100 MG TABLET: 100 100 MG | ORAL | Qty: 1

## 2013-11-16 NOTE — Unmapped (Signed)
Problem: Major Mood Disorder  Goal: Mania or depressed mood won???t interfere with daily function  Patient CIWA at 1700 was a five. Patient ate 100% of dinner. Patient stated that he was doing better than this morning. He stated that he had a hard time sleeping last night. Patient was given 50 mg of vistaril @ 1734. Patient was calm and cooperative. No management issues. Will continue to monitor for safety and needs.

## 2013-11-16 NOTE — Unmapped (Signed)
Pt felt good at the beginning of shift.  CIWAA 11 and 14.  Toward end of shift, pt became very anxious and upset.  Pt states that he is seeing his dead brothers,both of which committed suicide.  One brother is telling him to kill himself,the other is telling him to get off drugs.  Ativan given per CIWAA protocol, prn vistaril 50 mg given @ 1138, prn risperdal 1 mg given @ 1446 with no effect.  Dr. Alvester Morin was notified.  He suggested keeping the pt in special care area with frequent checks.  Will continue to monitor.

## 2013-11-16 NOTE — Unmapped (Signed)
Patient's CIWA was not completed due to patient was sleeping.Will continue to monitor for safety and needs.

## 2013-11-16 NOTE — Unmapped (Signed)
Occupational Therapy/Therapeutic Recreation Progress Note      KELLIS MCADAM  [id number]      Group: Task  Attendance: Attended  10/45 minutes  Level of Participation: Minimal, Interacted with staff, Initiated Comment(s) and Followed Directions    Pt arrived to group late after invite. Affect was flat. Pt requested just to sit among peers and listen to music during group session. Pt sat among peers and was social with staff in r/t hip hop music. Pt presented no issues during group session.    MIKE CHERRY, COTA/L  11/16/2013 3:44 PM    Signed by: Honor Loh OTR/L, MOT 11/17/13 [phone]

## 2013-11-16 NOTE — Unmapped (Signed)
Problem: Major Mood Disorder  Goal: Mania or depressed mood won???t interfere with daily function  Restless. Reports difficulty attaining and staying asleep. Vistaril [id number] for anxiety.safety checks per policy.

## 2013-11-16 NOTE — Unmapped (Signed)
Occupational Therapy/Therapeutic Recreation Progress Note      Alexander [name]  [id number]      Group: Health Management  Attendance: Attended and 40/45 minutes  Level of Participation: Moderate, Interacted with peer(s), Interacted with staff and Initiated Comment(s)    Pt initially arrived in group with irritable edge to demeanor.  During opening discussion, shared, I can't think of anything positive when asked to ID leisure pursuits.  With encouragement and verbal cues, acknowledged that he formerly enjoyed listening to hip hop music.  Participated in structured activity r/t ID of healthy leisure pursuits and ID of benefits of leisure participation.  ID'ed relaxation as a personal benefit.  Pt demonstrated moderate focus & good organization during activity. PMA AEB abruptly left group, but did return on his own.  Throughout group, demeanor improved and pt able to ID other interests in martial arts (speficically Karate and jiu jitsu).  Stated he stopped participating in these interests, but would like to do them again.  Also expressed enjoyment in camping and hiking.  Maintained appropriate behavior throughout group.      Staci Dack L JACOBS, OTR/L  11/16/2013 12:24 PM

## 2013-11-16 NOTE — Unmapped (Signed)
Problem: Major Mood Disorder  Goal: Mania or depressed mood won???t interfere with daily function  Pt out on unit, A/O, denies SI/HI.  Currently seeing hallucinations of his deceased brothers.  CIWAA 11 and 14.  Ativan given per CIWAA protocol.  Intervention: Provide reassurance and orient to reality  Provided support and reassurance regarding hallucinations  Intervention: Dispense prescribed medication per MD order  Med compliant  Intervention: Address personal hygiene and care  Shower encouraged-supplies available

## 2013-11-16 NOTE — Unmapped (Signed)
Patient asked for ativan but he did not meet the parameters. Patient was given vistaril 50 mg @ 2011. Patient did state that he wanted to quit smoking and that he was going to rehab on Monday. Will continue to monitor for safety and needs.

## 2013-11-16 NOTE — Unmapped (Signed)
[facility]  Subsequent Day Note    Alexander [name]  [id number]    Interval History:  One day    Hospital Day: 2    Chief Complaint: does not feel good    Updates to Psych/Family/Substance/Medical/Social History:  Patient still complains that he does not feel good but he does feel a lot better than he did yesterday,.  He states that he felt so bad yesterday and that is why he put the string around his neck. There is no ligature mark on his neck.  He indicated that he would never try that again and there is a general feeling that this behavior was not terribly seriouis.        As above he does state that he feels much better today compared to yesterday. He states that he is not at risk for harming himself and he is not suicidal.  He is optimistic and looking forward to going into the program for his substances in [place] and therefore I do not feel that he is at risk.     This is a mood disorder nos associated with alcohol and opiate use disorder     Medical Review Of Systems:  Respiratory: negative  Cardiovascular: negative  Gastrointestinal: negative  Neurological: negative    Objective:  Vitals:  Vital signs in last 24 hours:  Temp:  [97 ??F (36.1 ??C)-97.8 ??F (36.6 ??C)] 97.8 ??F (36.6 ??C)  Heart Rate:  [56-94] 62  Resp:  [18] 18  BP: (122-142)/(70-89) 123/77 mmHg      Labs:  Recent Results (from the past 48 hour(s))   URINE DRUG SCREEN, STAT    Collection Time     11/14/13 12:56 PM       Result Value Range    Cocaine UR, 300 ng/mL Cutoff Presumptive Positive (*) Negative    Opiates UR, 300 ng/mL Cutoff Presumptive Positive (*) Negative    Methamph, UR, 1000 ng/mL Cutoff Negative  Negative    THC UR, 50 ng/mL Cutoff Negative  Negative    Amphetamines UR, 1000 ng/mL Cutoff Negative  Negative    Phencyclidine (PCP) UR, 25 ng/mL Cutoff Negative  Negative    Benzodiazepines UR, 300 ng/mL Cutoff Presumptive Positive (*) Negative    Barbiturates UR, 300  ng/mL Cutoff Negative  Negative    Methadone, UR,  300 ng/mL Cutoff Negative  Negative    Oxycodone UR Negative  Negative    MDMA URINE Negative  Negative    TCA UR, 1000 ng/mL Cutoff Negative  Negative   CBC    Collection Time     11/14/13  1:45 PM       Result Value Range    WBC 5.6  3.8 - 10.8 10E3/uL    RBC 4.73  4.20 - 5.80 10E6/uL    Hemoglobin 14.6  13.2 - 17.1 g/dL    Hematocrit 54.0  98.1 - 50.0 %    MCV 89.1  80.0 - 100.0 fL    MCH 30.9  27.0 - 33.0 pg    MCHC 34.6  32.0 - 36.0 g/dL    RDW 19.1  47.8 - 29.5 %    Platelets 237  140 - 400 10E3/uL    MPV 9.6  7.5 - 11.5 fL   DIFFERENTIAL    Collection Time     11/14/13  1:45 PM       Result Value Range    Neutrophils Relative 65.1  40.0 - 80.0 %    Lymphocytes  Relative 22.1  15.0 - 45.0 %    Monocytes Relative 10.1  0.0 - 12.0 %    Eosinophils Relative 1.9  0.0 - 8.0 %    Basophils Relative 0.8  0.0 - 1.0 %    Neutrophils Absolute 3646  1500 - 7800 /uL    Lymphocytes Absolute 1238  850 - 3900 /uL    Monocytes Absolute 566  200 - 950 /uL    Eosinophils Absolute 106  15 - 500 /uL    Basophils Absolute 45  0 - 200 /uL   BASIC METABOLIC PANEL    Collection Time     11/14/13  1:45 PM       Result Value Range    Sodium 146  135 - 146 mmol/L    Potassium 4.4  3.5 - 5.3 mmol/L    Chloride 105  98 - 110 mmol/L    CO2 29  21 - 33 mmol/L    Anion Gap 12  3 - 16 mmol/L    BUN 10  7 - 25 mg/dL    Creatinine 1.61  0.96 - 1.30 mg/dL    Glucose 045 (*) 65 - 99 mg/dL    Calcium 8.8  8.6 - 40.9 mg/dL    GFR MDRD Af Amer 811      GFR MDRD Non Af Amer 99      Osmolality, Calculated 302  278 - 305 mOsm/kg   HEMOGLOBIN A1C    Collection Time     11/14/13  1:45 PM       Result Value Range    Hemoglobin A1C 5.0  4.8 - 6.4 %   LIPID PANEL    Collection Time     11/14/13  1:45 PM       Result Value Range    Cholesterol, Total 112  0 - 200 mg/dL    Triglycerides 78  10 - 150 mg/dL    HDL 37  30 - 60 mg/dL    LDL Cholesterol 59  0 - 160 mg/dL   HEPATIC FUNCTION PANEL    Collection Time     11/14/13  1:45 PM       Result Value Range     Total Bilirubin 0.4  0.2 - 1.2 mg/dL    Bilirubin, Direct 0.0  0.0 - 0.2 mg/dL    AST 914 (*) 14 - 65 U/L    ALT 390 (*) 13 - 69 U/L    Alkaline Phosphatase 90  40 - 115 U/L    Total Protein 6.8  6.2 - 8.3 g/dL    Albumin 4.1  3.6 - 5.1 g/dL   FOLATE    Collection Time     11/14/13  1:45 PM       Result Value Range    Folic Acid 13.10  2.76 - 17.00 ng/mL   T4, FREE    Collection Time     11/14/13  1:45 PM       Result Value Range    Free T4 1.08  0.61 - 1.76 ng/dL   TSH    Collection Time     11/14/13  1:45 PM       Result Value Range    TSH 0.65  0.45 - 4.50 mIU/L   VITAMIN B12    Collection Time     11/14/13  1:45 PM       Result Value Range    Vitamin B-12 483  239 - 931 pg/mL   HEPATITIS  C ANTIBODY    Collection Time     11/15/13 11:15 AM       Result Value Range    HCV Ab Reactive (*) Nonreactive   HIV 1+2 ANTIBODY/ANTIGEN WITH REFLEX    Collection Time     11/15/13 11:15 AM       Result Value Range    HIV 1+2 AB/AGN Nonreactive  Nonreactive       Scheduled Meds:   ??? citalopram  10 mg Oral Daily 0900   ??? cloNIDine HCl  0.2 mg Oral TID   ??? folic acid  1 mg Oral Daily 0900   ??? hydrocortisone   Topical BID   ??? lithium carbonate  300 mg Oral BID WC   ??? multivitamin  1 tablet Oral Daily 0900   ??? nicotine  1 patch Transdermal Daily 0900   ??? thiamine  100 mg Oral Daily 0900   ??? traZODone  50 mg Oral Nightly (2100)           Mental Status Evaluation:  Appearance:  age appropriate   Behavior:  doing very well today. yesterday he was trying to put a string around his neck but this would appear to be more manipulative than dangerously suicidal   Speech:  normal volume   Mood:  normal   Affect:  normal and Looking forward to and optimistic about the [place] substance abuse program.   Thought Process:  goal directed   Thought Content:  denies SI/HI. Optimistic about program.   Sensorium:  person, place, time/date and situation   Memory: intact   Cognition:  grossly intact   Insight:  fair   Judgment:  fair     Assessment  and Plan:                 AXIS I: Mood disorder nos, Opiate use disorder. Alcohol use disorder    AXIS II: Deferred     AXIS III: transaminitis, rash on abdomen    Assessment: Patient not felt to be at high risk for completed suicide and is appropirate to enter substance abuse program wheneve it is availabe.      Plan: Possible discharge on Monday to substance abuse program.  Continue to encourage his optimism about the program and I have already told him to come to [place] if he should be feeling bad rather that doing anything to harm himself.      Benetta Spar  11/16/2013

## 2013-11-17 MED ORDER — citalopram (CELEXA) 20 MG tablet
20 | ORAL_TABLET | Freq: Every day | ORAL | Status: AC
Start: 2013-11-17 — End: 2013-11-17

## 2013-11-17 MED ORDER — citalopram (CELEXA) 20 MG tablet
20 | ORAL_TABLET | Freq: Every day | ORAL | Status: AC
Start: 2013-11-17 — End: 2013-12-12

## 2013-11-17 MED FILL — CITALOPRAM 20 MG TABLET: 20 20 MG | ORAL | Qty: 1

## 2013-11-17 MED FILL — THERA 400 MCG TABLET: 400 400 mcg | ORAL | Qty: 1

## 2013-11-17 MED FILL — FOLIC ACID 1 MG TABLET: 1 1 MG | ORAL | Qty: 1

## 2013-11-17 MED FILL — LITHIUM CARBONATE 300 MG CAPSULE: 300 300 MG | ORAL | Qty: 1

## 2013-11-17 MED FILL — RISPERIDONE 1 MG DISINTEGRATING TABLET: 1 1 MG | ORAL | Qty: 1

## 2013-11-17 MED FILL — HYDROXYZINE PAMOATE 50 MG CAPSULE: 50 50 MG | ORAL | Qty: 1

## 2013-11-17 MED FILL — CLONIDINE HCL 0.2 MG TABLET: 0.2 0.2 MG | ORAL | Qty: 1

## 2013-11-17 MED FILL — THIAMINE HCL (VITAMIN B1) 100 MG TABLET: 100 100 MG | ORAL | Qty: 1

## 2013-11-17 MED FILL — NICORELIEF 2 MG GUM: 2 2 mg | BUCCAL | Qty: 1

## 2013-11-17 MED FILL — PROMETHAZINE 25 MG TABLET: 25 25 MG | ORAL | Qty: 1

## 2013-11-17 MED FILL — NICOTINE 21 MG/24 HR DAILY TRANSDERMAL PATCH: 21 21 mg/24 hr | TRANSDERMAL | Qty: 1

## 2013-11-17 NOTE — Unmapped (Signed)
Sw met with pt. To discuss discharge planning.  Pt. Denies HI/SI and wants to be discharged. Pt. Realizes arrangements for him to go to treatment were in the process of being made and were set for tomorrow but pt. Wants to leave today and feels he is safe at this time.  Attempted to call Thereasa Parkin, who pt. Reports is his boss and supportive.  Mr. Alexander [name] is on his way to pick pt. Up here at [facility].  Left message on Mr. Alexander [name]'s voice mail.    Mr. Alexander [name] is elderly, according to pt., and may be unable to find pt. Once he makes it to [address]  SW went to front desk and provided fyi to front desk personnel to assist Mr. Alexander [name] once he arrives to hospital to retrieve pt...  Mr. Alexander [name] phone number is [phone]

## 2013-11-17 NOTE — Unmapped (Signed)
[facility]  Subsequent Day Note    STEPEHN [name]  [id number]    Interval History:  One day    Hospital Day: 3    Chief Complaint: does not feel good    Updates to Psych/Family/Substance/Medical/Social History:  Patient had stated that he attempted to put a string around his neck yesterday in order to kill himself He is now stating that he wouild not do this anymore and he is not suicidal at this time. But now he is often stating that he is hearing voices.  And he clearly wants more of the Ativan.     Patient is often disappointed when he is given Vistaril instead of Ativan.  Then he will be heard saying, Ron[place],  I must act the fool in order to get Ativan.     I do not believe that he is psychotic and I do not believe that he is suicidal.  It appears that by his own admission he acting out in ordr to obtain ativan.      This is a mood disorder nos associated with alcohol and opiate use disorder     Medical Review Of Systems:  Respiratory: negative  Cardiovascular: negative  Gastrointestinal: negative  Neurological: negative    Objective:  Vitals:  Vital signs in last 24 hours:  Temp:  [97.6 ??F (36.4 ??C)-98.3 ??F (36.8 ??C)] 97.6 ??F (36.4 ??C)  Heart Rate:  [60-75] 75  Resp:  [16-18] 16  BP: (124-133)/(60-78) 124/60 mmHg      Labs:  Recent Results (from the past 48 hour(s))   SYPHILIS MONITORING (RPR W TITER)    Collection Time     11/15/13 11:15 AM       Result Value Range    RPR Monitoring Screen Non-Reactive  Non-Reactive   HEPATITIS C ANTIBODY    Collection Time     11/15/13 11:15 AM       Result Value Range    HCV Ab Reactive (*) Nonreactive   HIV 1+2 ANTIBODY/ANTIGEN WITH REFLEX    Collection Time     11/15/13 11:15 AM       Result Value Range    HIV 1+2 AB/AGN Nonreactive  Nonreactive       Scheduled Meds:   ??? citalopram  20 mg Oral Daily 0900   ??? cloNIDine HCl  0.2 mg Oral TID   ??? folic acid  1 mg Oral Daily 0900   ??? hydrocortisone   Topical BID   ??? lithium carbonate  300 mg Oral BID WC   ???  multivitamin  1 tablet Oral Daily 0900   ??? nicotine  1 patch Transdermal Daily 0900   ??? thiamine  100 mg Oral Daily 0900   ??? traZODone  100 mg Oral Nightly (2100)           Mental Status Evaluation:  Appearance:  age appropriate   Behavior:  Acting out in order to get ativan   Speech:  normal volume   Mood:  normal   Affect:  Angry when he does not get Ativan   Thought Process:  goal directed   Thought Content:  denies SI/HI. Optimistic about program.  States that he understands that he must act the fool in order to get ativan   Sensorium:  person, place, time/date and situation   Memory: intact   Cognition:  grossly intact   Insight:  fair   Judgment:  fair     Assessment and Plan:  AXIS I: Mood disorder nos, Opiate use disorder. Alcohol use disorder    AXIS II: Deferred     AXIS III: transaminitis, rash on abdomen    Assessment: Patient not felt to be at high risk for completed suicide and is appropirate to enter substance abuse program wheneve it is availabe.      Plan: Possible discharge on Monday to substance abuse program.  Continue to encourage his optimism about the program and I have already told him to come to [place] if he should be feeling bad rather that doing anything to harm himself.      Benetta Spar  11/17/2013

## 2013-11-17 NOTE — Unmapped (Signed)
Problem: Major Mood Disorder  Goal: Mania or depressed mood won???t interfere with daily function  Intervention: Assess for target symptoms & provide for safety  Sleeping well without distress or complaint, turns side to side in sleep, respirations easy and unlabored.  Monitored for safety, location, fall risk, and comfort.

## 2013-11-17 NOTE — Unmapped (Signed)
Pt given Phenergan 25 mg @ 0449 for c/o nausea and Vistaril 50 mg @ 0525 for c/o anxiety.  Medications were effective.  Will continue to monitor.

## 2013-11-17 NOTE — Unmapped (Signed)
Occupational Therapy/Therapeutic Recreation Progress Note      Alexander [name]  [id number]      Group: Health Management  Attendance: Attended and 30/30 minutes  Level of Participation: Active, Interacted with peer(s), Interacted with staff, Remained on Task/Topic/Number of Minutes: 30/30, Initiated Comment(s) and Followed Directions    Pt attended group readily upon invitation.  Participated in structured gross motor activity working to improve self-esteem, cognition, communication, and coordination. Pt worked with good focus and organization. Good communication, focus, and organization during activity.  Demeanor was s/w apathetic and sarcastic at times, however pt initiated several relevant comments.  Able to identify relevant topic covered in Health Management Group as well as several skills worked on throughout group process.  Cooperative behavior observed.      [name] L JACOBS, OTR/L  11/17/2013 11:01 AM

## 2013-11-17 NOTE — Unmapped (Signed)
Discharge order given and received. Pt was able to verbalize understanding. Discharge prescription given. Personal belongings returned. Pt given instructions how to get to the safe in [facility] to retrieve his belongings. Pt walked off the unit at this time with his family member.

## 2013-11-17 NOTE — Unmapped (Signed)
[facility]   INPATIENT PSYCHIATRY DISCHARGE SUMMARY   Patient: Alexander [name]   Date of Admission: 11/14/2013   Date of Discharge: 11/17/2013   Attending Physician(s): Wirick   Unit: [address] [facility]   Resident Physician(s): Larence Penning   Medical Students: Dorthula Perfect, Reino Kent MSIII   Admission Diagnoses:   Axis I: Mood Disorder; Opiate use disorder; Alcohol use disorder   Axis II: Deferred   Axis III: Transaminitis   Axis IV: Significant stressors in primary support   Axis V: GAF 31-40   Discharge Diagnoses:   Axis I: mood Disorder nos Opiate and alcohol use disorder   Axis II: defer   Axis III: transaminitis   Axis IV: ongoing use of opiates   Axis V: GAF 55    Alexander [name], Alexander [name]    Home Medication Instructions  [id number]     Printed on:11/19/13 1643    Medication Information                       citalopram (CELEXA) 20 MG tablet   Take 1 tablet (20 mg total) by mouth daily.            ibuprofen (ADVIL,MOTRIN) 600 MG tablet   Take 1 tablet (600 mg total) by mouth every 6 hours as needed for Pain.            Is the patient prescribed multiple antipsychotics? No.   Allergies:   No Known Allergies   Consults:   Social work, occupational therapy, recreational therapy   Imaging:   None   Therapeutic Interventions:   Interpersonal, supportive, group, psychopharmacologic, and unit milieu.   Reason for Admission:   27 y.o.yo male admitted on 11/14/2013 for SI in the setting of heroin and alcohol withdrawal.   Per [facility] Notes:   Pt is a 27 y/o WSM w/ self-reported hx of PTSD, depression, & substance abuse brought to [facility] by [facility] after pt went to the police station and reported SI. Pt presents sad and tearful. Says that this is a difficult time of the year because both of his older brothers successfully committed suicide & have bdays in December. Reports that his father passed in 2011 and he misses his dad during the holidays as well. Pt reports that he has been actively using IV heroine and etoh daily since about mid  April. States that he was prescribed Lithium, Klonopin, & an unk antidepressant x 4 mos while sober/clean prior to this. Says that this medication regimen helped with his mood, calmed him down, & motivated him. Reports that he has been attempting to stop use on his own & was seen in [facility] yesterday for withdrawal sxs. States that this morning he called multiple substance abuse tx programs but was unsuccessful in getting into tx. Pt reports decreased motivation and feelings of worthlessness. Requests to get back on psych meds and assistance to get into both mental health and substance abuse tx. Still voicing SI at this time.   Hospital Course:   The patient was admitted to the inpatient psychiatry and evaluated by each member of an interdisciplinary treatment team and an individualized treatment plan was formulated with the patient's full participation. In addition, it should be noted the patient did eventually provide informed consent for the administration of psychotropic medications and that this informed consent process included a thorough discussion of risks, benefits, side-effects, and alternatives. The patient was admitted on a 72-hour hold.   On 11/15/13, patient barricaded self into  room and began to fashion noose out of hospital gown. He was placed in open seclusion and dressed in paper hospital gown for his own safety. It appears to me that this not a serious situation and the patient was trying to obtain more meds by showing this behavior. I was on the weekend after this occurred and I did evaluate him and I felt that he was not real dangerous. I emphasized to the patient that he should come to [place] and let us know if he were on the verge of doing something dangerous.   In spite of my encouragement, patient continued to act out in a manner that was drug seeking. On the day of discharge, the patient demanded transfer because he stated that there was a knot on his chest. What he pointed to was his sternum. He  would not accept my explanation and demanded discharge. He denied that he was suicidal. I encouraged him to stay but he insisted upon leaving. He stated that he would still seek out a rehab program. He arranged for a ride within an hour of deciding upon discharge. It did appear that he had been planning his discharge for some time. He did seem upbeat as he was leaving. He thanked for [employer] him and assured me that he was going into a program.   I did stop lithium because I did not feel that he was manic and I was concerned about followup.   1. Mood Disorder NOS   2. Opiate use disorder   3. Alcohol use disorder   CIWAs 4,5 over 12/11 -12/12.   MSE On Discharge:   Filed Vitals:     11/15/13 0700    BP:  143/88    Pulse:  102    Temp:  97.2 ??F (36.2 ??C)    Resp:  18    SpO2:       Appearance and Behavior: appears stated age, within normal limits for weight, grooming good, wearing causal attire, no psychomotor retardation or agitation, cooperative, good eye contact.   Movement and Abnormal Muscle movements: no tics, tremors, rigidity, or evidence of EPS or TD.   Speech and Language: clear articulation, no aphasia, non-pressured. rate, production, volume and tone are all within normal limits.   Mood/Affect: ok affect euthymic, full range, content appropriate   Thought Process and Associations: goal directed and linear, no flight of ideas, no loosening of associations.   HI/SI: no SI. No HI Contracts for safety.   Content: No delusions, obsessions, or compulsions expressed   Perceptual Abnormalities: Denies AH/VH and does not appear to be responding to internal stimuli.   Orientation: person, place, time/date and situation   Memory/Attention: grossly intact to recent events and conversation   Fund of Knowledge: average   Insight and Judgment: fair, fair   Pertinent Labs:   Recent Results (from the past 1344 hour(s))    BASIC METABOLIC PANEL     Collection Time     11/13/13 2:30 PM    Result  Value  Range     Sodium   141  135 - 146 mmol/L     Potassium  4.2  3.5 - 5.3 mmol/L     Chloride  103  98 - 110 mmol/L     CO2  26  21 - 33 mmol/L     Anion Gap  12  3 - 16 mmol/L     BUN  10  7 - 25 mg/dL     Creatinine  0.91  0.50 - 1.30 mg/dL     Glucose  94  65 - 99 mg/dL     Calcium  9.5  8.6 - 10.2 mg/dL     GFR MDRD Af Amer  191      GFR MDRD Non Af Amer  102      Osmolality, Calculated  291  278 - 305 mOsm/kg    MAGNESIUM     Collection Time     11/13/13 2:30 PM    Result  Value  Range     Magnesium  1.8  1.5 - 2.5 mg/dL    CBC     Collection Time     11/13/13 2:30 PM    Result  Value  Range     WBC  8.2  3.8 - 10.8 10E3/uL     RBC  5.29  4.20 - 5.80 10E6/uL     Hemoglobin  16.1  13.2 - 17.1 g/dL     Hematocrit  47.8  29.5 - 50.0 %     MCV  88.8  80.0 - 100.0 fL     MCH  30.4  27.0 - 33.0 pg     MCHC  34.2  32.0 - 36.0 g/dL     RDW  62.1  30.8 - 65.7 %     Platelets  269  140 - 400 10E3/uL     MPV  9.2  7.5 - 11.5 fL    DIFFERENTIAL     Collection Time     11/13/13 2:30 PM    Result  Value  Range     Neutrophils Relative  77.8  40.0 - 80.0 %     Lymphocytes Relative  13.9 (*)  15.0 - 45.0 %     Monocytes Relative  7.3  0.0 - 12.0 %     Eosinophils Relative  0.4  0.0 - 8.0 %     Basophils Relative  0.6  0.0 - 1.0 %     Neutrophils Absolute  6380  1500 - 7800 /uL     Lymphocytes Absolute  1140  850 - 3900 /uL     Monocytes Absolute  599  200 - 950 /uL     Eosinophils Absolute  33  15 - 500 /uL     Basophils Absolute  49  0 - 200 /uL    VENOUS BLOOD GAS, LINE/SYRINGE     Collection Time     11/13/13 2:30 PM    Result  Value  Range     PH-Line Draw  7.37  7.32 - 7.42     PCO2-Line Draw  51  41 - 51 mm Hg     PO2-Line Draw  28  25 - 40 mm Hg     HCO3-Line Draw  29 (*)  24 - 28 mmol/L     CO2 Content-Line Draw  31 (*)  25 - 29 mmol/L     Base Excess-Line Draw  3.1 (*)  -2.0 - 3.0 mmol/L     %HBO2-Line Draw  47.8  40.0 - 70.0 %     Carboxyhgb-Line Draw  3.1 (*)  0.0 - 2.0 %     Methemoglobin-Line Draw  0.5  0.0 - 1.5 %     Reduced  Hemoglobin-Line Draw  48.6 (*)  0.0 - 5.0 %    URINALYSIS-MACROSCOPIC W/REFLEX TO MICROSCOPIC     Collection Time     11/13/13 6:21 PM    Result  Value  Range  Color, UA  Yellow  Yellow,Straw     Clarity, UA  Clear  Clear     Specific Gravity, UA  >=1.030  1.005 - 1.035     pH, UA  6.0  5.0 - 8.0     Protein, UA  30 mg/dL (*)  Negative mg/dL     Glucose, UA  Negative  Negative mg/dL     Ketones, UA  15 mg/dL (*)  Negative mg/dL     Bilirubin, UA  Small (*)  Negative     Blood, UA  Negative  Negative     Nitrite, UA  Negative  Negative     Urobilinogen, UA  0.2 E.U./dL  0.2 - 1.0 EU/dL     Leukocytes, UA  Negative  Negative    URINE CULTURE     Collection Time     11/13/13 6:21 PM    Result  Value  Range     Culture Result  1,000-10,000 cfu/mL      Culture Result  Mixed Skin/Urogenital Flora. No Further Workup.     URINALYSIS, MICROSCOPIC     Collection Time     11/13/13 6:21 PM    Result  Value  Range     RBC, UA  5 (*)  0 - 3 /HPF     WBC, UA  3  0 - 5 /HPF     Bacteria, UA  Rare (*)  None Seen /HPF     Mucus, UA  Present (*)  None Seen /HPF    URINE DRUG SCREEN, STAT     Collection Time     11/14/13 12:56 PM    Result  Value  Range     Cocaine UR, 300 ng/mL Cutoff  Presumptive Positive (*)  Negative     Opiates UR, 300 ng/mL Cutoff  Presumptive Positive (*)  Negative     Methamph, UR, 1000 ng/mL Cutoff  Negative  Negative     THC UR, 50 ng/mL Cutoff  Negative  Negative     Amphetamines UR, 1000 ng/mL Cutoff  Negative  Negative     Phencyclidine (PCP) UR, 25 ng/mL Cutoff  Negative  Negative     Benzodiazepines UR, 300 ng/mL Cutoff  Presumptive Positive (*)  Negative     Barbiturates UR, 300 ng/mL Cutoff  Negative  Negative     Methadone, UR, 300 ng/mL Cutoff  Negative  Negative     Oxycodone UR  Negative  Negative     MDMA URINE  Negative  Negative     TCA UR, 1000 ng/mL Cutoff  Negative  Negative    CBC     Collection Time     11/14/13 1:45 PM    Result  Value  Range     WBC  5.6  3.8 - 10.8 10E3/uL     RBC   4.73  4.20 - 5.80 10E6/uL     Hemoglobin  14.6  13.2 - 17.1 g/dL     Hematocrit  16.1  09.6 - 50.0 %     MCV  89.1  80.0 - 100.0 fL     MCH  30.9  27.0 - 33.0 pg     MCHC  34.6  32.0 - 36.0 g/dL     RDW  04.5  40.9 - 81.1 %     Platelets  237  140 - 400 10E3/uL     MPV  9.6  7.5 - 11.5 fL    DIFFERENTIAL  Collection Time     11/14/13 1:45 PM    Result  Value  Range     Neutrophils Relative  65.1  40.0 - 80.0 %     Lymphocytes Relative  22.1  15.0 - 45.0 %     Monocytes Relative  10.1  0.0 - 12.0 %     Eosinophils Relative  1.9  0.0 - 8.0 %     Basophils Relative  0.8  0.0 - 1.0 %     Neutrophils Absolute  3646  1500 - 7800 /uL     Lymphocytes Absolute  1238  850 - 3900 /uL     Monocytes Absolute  566  200 - 950 /uL     Eosinophils Absolute  106  15 - 500 /uL     Basophils Absolute  45  0 - 200 /uL    BASIC METABOLIC PANEL     Collection Time     11/14/13 1:45 PM    Result  Value  Range     Sodium  146  135 - 146 mmol/L     Potassium  4.4  3.5 - 5.3 mmol/L     Chloride  105  98 - 110 mmol/L     CO2  29  21 - 33 mmol/L     Anion Gap  12  3 - 16 mmol/L     BUN  10  7 - 25 mg/dL     Creatinine  4.54  0.98 - 1.30 mg/dL     Glucose  119 (*)  65 - 99 mg/dL     Calcium  8.8  8.6 - 10.2 mg/dL     GFR MDRD Af Amer  147      GFR MDRD Non Af Amer  99      Osmolality, Calculated  302  278 - 305 mOsm/kg    HEMOGLOBIN A1C     Collection Time     11/14/13 1:45 PM    Result  Value  Range     Hemoglobin A1C  5.0  4.8 - 6.4 %    LIPID PANEL     Collection Time     11/14/13 1:45 PM    Result  Value  Range     Cholesterol, Total  112  0 - 200 mg/dL     Triglycerides  78  10 - 150 mg/dL     HDL  37  30 - 60 mg/dL     LDL Cholesterol  59  0 - 160 mg/dL    HEPATIC FUNCTION PANEL     Collection Time     11/14/13 1:45 PM    Result  Value  Range     Total Bilirubin  0.4  0.2 - 1.2 mg/dL     Bilirubin, Direct  0.0  0.0 - 0.2 mg/dL     AST  829 (*)  14 - 65 U/L     ALT  390 (*)  13 - 69 U/L     Alkaline Phosphatase  90  40 - 115 U/L      Total Protein  6.8  6.2 - 8.3 g/dL     Albumin  4.1  3.6 - 5.1 g/dL    FOLATE     Collection Time     11/14/13 1:45 PM    Result  Value  Range     Folic Acid  13.10  2.76 - 17.00 ng/mL    T4, FREE  Collection Time     11/14/13 1:45 PM    Result  Value  Range     Free T4  1.08  0.61 - 1.76 ng/dL    TSH     Collection Time     11/14/13 1:45 PM    Result  Value  Range     TSH  0.65  0.45 - 4.50 mIU/L    VITAMIN B12     Collection Time     11/14/13 1:45 PM    Result  Value  Range     Vitamin B-12  483  239 - 931 pg/mL      Radiology: No results found.   Condition and Disposition:   Stable to home.   Outpatient Followup:   We have referred him to Residential rehab, but he refused to stay in the hosptial to go there from the hospital.

## 2013-11-17 NOTE — Unmapped (Signed)
Occupational Therapy/Therapeutic Recreation Progress Note      KEAGEN HEINLEN  [id number]      Group: Project  Attendance: Refused  <5/45 minutes  Level of Participation: Interacted with peer(s) and Interacted with staff    Pt attended group upon invitation. Present for brief time during group introduction.  However, left group abruptly, stating, I'm too irritated and did not return.     Guage Efferson L JACOBS, OTR/L  11/17/2013 12:45 PM

## 2013-11-17 NOTE — Unmapped (Signed)
Please follow through with our recommendation for you to participate in treatment for your substance use.  The number to call is [phone].

## 2013-11-17 NOTE — Unmapped (Signed)
Pt has been visible on the unit. Pt has been frequently at the nurses station requesting medication. Pt advised several times that he had received all of the PRN medications that he can be given. Pt is somatic and med seeking. Pt asked this writer several times to call the doctor for,  more and  a different medication.  Pt CIWA score at 1130=4. Pt was given Risperdal 1 mg at 1020. Vistaril 50 mg at 1013. Phenergan 25 mg at 1215. Pt stated that the medications were not effective. MD notified. Will continue to monitor.

## 2013-11-19 NOTE — Unmapped (Signed)
Pt discharged over the weekend. He was scheduled to go to [facility] on Monday 12/15.  He asked to be discharged.  Pt probably will not follow up. Discharge summary will remain in EPIC if needed in the future for service providers.

## 2013-11-29 ENCOUNTER — Inpatient Hospital Stay: Admit: 2013-11-29 | Discharge: 2013-11-30 | Attending: Personal Emergency Response Attendant

## 2013-11-29 MED ORDER — KETOROLAC TROMETHAMINE 30 MG/ML IJ SOLN
30 MG/ML | Freq: Once | INTRAMUSCULAR | Status: AC
Start: 2013-11-29 — End: 2013-11-29
  Administered 2013-11-29: 22:00:00 via INTRAVENOUS

## 2013-11-29 MED ORDER — SODIUM CHLORIDE 0.9 % IV SOLN
0.9 % | INTRAVENOUS | Status: DC
Start: 2013-11-29 — End: 2013-11-29

## 2013-11-29 MED ORDER — ONDANSETRON HCL 4 MG/2ML IJ SOLN
4 MG/2ML | Freq: Once | INTRAMUSCULAR | Status: AC
Start: 2013-11-29 — End: 2013-11-29
  Administered 2013-11-29: 22:00:00 via INTRAVENOUS

## 2013-11-29 MED ORDER — PROMETHAZINE HCL 25 MG/ML IJ SOLN
25 MG/ML | Freq: Once | INTRAMUSCULAR | Status: AC
Start: 2013-11-29 — End: 2013-11-29
  Administered 2013-11-29: 23:00:00 via INTRAVENOUS

## 2013-11-29 MED ORDER — SODIUM CHLORIDE 0.9 % IV BOLUS
0.9 % | Freq: Once | INTRAVENOUS | Status: AC
Start: 2013-11-29 — End: 2013-11-29
  Administered 2013-11-29: 23:00:00 via INTRAVENOUS

## 2013-11-29 MED FILL — ONDANSETRON HCL 4 MG/2ML IJ SOLN: 4 MG/2ML | INTRAMUSCULAR | Qty: 2

## 2013-11-29 MED FILL — PROMETHAZINE HCL 25 MG/ML IJ SOLN: 25 MG/ML | INTRAMUSCULAR | Qty: 1

## 2013-11-29 MED FILL — KETOROLAC TROMETHAMINE 30 MG/ML IJ SOLN: 30 MG/ML | INTRAMUSCULAR | Qty: 1

## 2013-11-29 NOTE — ED Provider Notes (Signed)
Since with healed wounds that were closed with 6 sutures about 12 days ago.  The wounds are clean dry and intact he has 2 sutures over his left eyebrow and 3 sutures over the PIP joint of his right index finger.  There is no swelling redness or drainage there is no pain.    Wallace Going, MD  12/01/13 [phone]

## 2013-11-29 NOTE — ED Provider Notes (Signed)
[facility]     eMERGENCY dEPARTMENT eNCOUnter   [facility]      Pt Name: Brett [name]  MRN: [id number]  Birthdate Jun 04, 1986  Date of evaluation: 11/29/2013    CHIEF COMPLAINT     Drug Overdose    Nursing Notes, Past Medical Hx, Past Surgical Hx, Social Hx, Allergies, and Family Hx were all reviewed and agreed with, or any disagreements were addressed in the HPI.    HISTORY OF PRESENT ILLNESS     Brett [name] is a 27 y.o. male who presents with complaints of unresponsiveness. He is seen by me at 4:39 PM.  This patient is brought to the emergency department by local paramedics after he was found unresponsive and down.  He had been doing heroin.  Bystanders called when he was unable to be aroused.  Paramedics were able to place an IV line and administered intravenous Narcan and he had a spontaneous return to consciousness.  At this time the patient complains of mild nausea and mild headache.  He states he is not depressed and was not suicidal.  He had not done heroin in over 3 months because of some chronic pain issues he decided to use it today.  He is not complaining of pain at the present time.    REVIEW OF SYSTEMS       Review of Systems   Constitutional: Negative.  Negative for fever and chills.   Respiratory: Negative.    Cardiovascular: Negative.    Gastrointestinal: Positive for nausea. Negative for vomiting.   Genitourinary: Negative.    Musculoskeletal: Negative.    Skin: Negative.    Neurological: Positive for headaches.   Psychiatric/Behavioral: Positive for substance abuse. Negative for depression, suicidal ideas and hallucinations. The patient is nervous/anxious.          "Remaining review of systems reviewed and negative. I have reviewed the nursing triage documentation and agree unless otherwise noted below."     PAST MEDICAL HISTORY     He has a past medical history of Thyroid disease and Elevated LFTs.    SURGICAL HISTORY       He has past surgical history that includes  Mandible fracture surgery.    CURRENT MEDICATIONS       Previous Medications    No medications on file       ALLERGIES       He has No Known Allergies.    FAMILY HISTORY       He has no family status information on file.    He has a family history is not on file.    SOCIAL HISTORY       He reports that he has been smoking Cigarettes.  He has been smoking about 1.00 pack per day. He does not have any smokeless tobacco history on file. He reports that he drinks about 7.2 ounces of alcohol per week. He reports that he uses illicit drugs.    PHYSICAL EXAM       INITIAL VITALS: BP 155/93   Pulse 118   Temp(Src) 98.2 ??F (36.8 ??C) (Oral)   Resp 24   Ht 5\' 9"  (1.753 m)   Wt 73.936 kg (163 lb)   BMI 24.06 kg/m2   Physical Exam   Constitutional: He is oriented to person, place, and time and well-developed, well-nourished, and in no distress. No distress.   HENT:   Head: Normocephalic and atraumatic.   Eyes: Conjunctivae are normal.   Cardiovascular: Normal rate,  regular rhythm and normal heart sounds.    Pulmonary/Chest: Effort normal and breath sounds normal.   Abdominal: Soft. There is tenderness.   Musculoskeletal: Normal range of motion.   Neurological: He is alert and oriented to person, place, and time. GCS score is 15.   Skin: Skin is warm and dry. He is not diaphoretic.   Nursing note and vitals reviewed.        DIAGNOSTIC RESULTS     LABS:   Labs Reviewed - No data to display        RECENT VITALS:  BP: 155/93 mmHg, Temp: 98.2 ??F (36.8 ??C), Pulse: 118, Resp: 24     EMERGENCY DEPARTMENT COURSE:         CONSULTS:  None    PROCEDURES:  Medications   ondansetron (ZOFRAN) injection 4 mg (4 mg Intravenous Given 11/29/13 1705)   ketorolac (TORADOL) injection 30 mg (30 mg Intravenous Given 11/29/13 1706)        DIFFERENTIAL DIAGNOSIS/ MDM:     5:07 PM this patient presented to the emergency department as an accidental heroin overdose.  He was given Narcan by paramedics.  My plan is to observe him in the emergency department  until 8:30 PM tonight he verbalizes his agreement with this plan.    FINAL IMPRESSION      1. Heroin overdose, initial encounter          DISPOSITION/PLAN     DISPOSITION     PATIENT REFERRED TO:  No follow-up provider specified.    DISCHARGE MEDICATIONS:  New Prescriptions    No medications on file       (Please note that portions of this note were completed with a voice recognition program.  Efforts were made to edit the dictations but occasionally words are mis-transcribed.)        Omer Jack, MD  11/29/13 1708

## 2013-11-29 NOTE — Discharge Instructions (Signed)
Is clean and dry.  Apply bacitracin dressing and sterile gauze as needed.  Follow-up with primary care doctor as needed to return immediately to the emergency room for any new or worsening symptoms.  Return especially for signs of infection redness swelling or pain about about the area.

## 2013-11-29 NOTE — ED Notes (Signed)
Patient awake, alert and oriented. Skin w/d, resp easy.  Patient states that he is ready to go home.  Will be staying with boss. Call to [facility]- pt's boss will pay for ride when he gets  to boss's home.  Juice and crackers given to patient.  Pt in NSR on monitor.  Cooperative    Lesia Sago, RN  11/29/13 [phone]

## 2013-12-06 ENCOUNTER — Inpatient Hospital Stay
Admit: 2013-12-06 | Discharge: 2013-12-06 | Disposition: A | Discharge status: Home / Self Care | Payer: PRIVATE HEALTH INSURANCE

## 2013-12-06 ENCOUNTER — Emergency Department: Admit: 2013-12-06 | Payer: PRIVATE HEALTH INSURANCE

## 2013-12-06 DIAGNOSIS — R1013 Epigastric pain: Secondary | ICD-10-CM

## 2013-12-06 LAB — BASIC METABOLIC PANEL
Anion Gap: 18 mmol/L — ABNORMAL HIGH (ref 3–16)
BUN: 9 mg/dL (ref 7–25)
CO2: 26 mmol/L (ref 21–33)
Calcium: 9.4 mg/dL (ref 8.6–10.2)
Chloride: 105 mmol/L (ref 98–110)
Creatinine: 0.92 mg/dL (ref 0.50–1.30)
GFR MDRD Af Amer: 121 See note.
GFR MDRD Non Af Amer: 100 See note.
Glucose: 91 mg/dL (ref 65–99)
Osmolality, Calculated: 306 mosm/kg — ABNORMAL HIGH (ref 278–305)
Potassium: 4.4 mmol/L (ref 3.5–5.3)
Sodium: 149 mmol/L — ABNORMAL HIGH (ref 135–146)

## 2013-12-06 LAB — URINALYSIS-MACROSCOPIC W/REFLEX TO MICROSCOPIC
Bilirubin, UA: NEGATIVE
Blood, UA: NEGATIVE
Glucose, UA: NEGATIVE mg/dL
Ketones, UA: NEGATIVE mg/dL
Leukocyte Esterase, UA: NEGATIVE
Nitrite, UA: NEGATIVE
Protein, UA: NEGATIVE mg/dL
Specific Gravity, UA: 1.015 (ref 1.005–1.035)
Urobilinogen, UA: 0.2 EU/dL (ref 0.2–1.0)
pH, UA: 7 (ref 5.0–8.0)

## 2013-12-06 LAB — DIFFERENTIAL
Basophils Absolute: 61 /uL (ref 0–200)
Basophils Relative: 0.9 % (ref 0.0–1.0)
Eosinophils Absolute: 95 /uL (ref 15–500)
Eosinophils Relative: 1.4 % (ref 0.0–8.0)
Lymphocytes Absolute: 1251 /uL (ref 850–3900)
Lymphocytes Relative: 18.4 % (ref 15.0–45.0)
Monocytes Absolute: 571 /uL (ref 200–950)
Monocytes Relative: 8.4 % (ref 0.0–12.0)
Neutrophils Absolute: 4821 /uL (ref 1500–7800)
Neutrophils Relative: 70.9 % (ref 40.0–80.0)

## 2013-12-06 LAB — CBC
Hematocrit: 50 % (ref 38.5–50.0)
Hemoglobin: 17.1 g/dL (ref 13.2–17.1)
MCH: 30.5 pg (ref 27.0–33.0)
MCHC: 34.3 g/dL (ref 32.0–36.0)
MCV: 89 fL (ref 80.0–100.0)
MPV: 9.7 fL (ref 7.5–11.5)
Platelets: 236 10*3/uL (ref 140–400)
RBC: 5.61 10*6/uL (ref 4.20–5.80)
RDW: 13 % (ref 11.0–15.0)
WBC: 6.8 10*3/uL (ref 3.8–10.8)

## 2013-12-06 LAB — HEPATIC FUNCTION PANEL
ALT: 398 U/L (ref 13–69)
AST: 195 U/L (ref 14–65)
Albumin: 4.8 g/dL (ref 3.6–5.1)
Alkaline Phosphatase: 127 U/L (ref 40–115)
Bilirubin, Direct: 0 mg/dL (ref 0.0–0.2)
Total Bilirubin: 0.5 mg/dL (ref 0.2–1.2)
Total Protein: 8.8 g/dL (ref 6.2–8.3)

## 2013-12-06 LAB — LIPASE: Lipase: 120 U/L (ref 23–300)

## 2013-12-06 MED ORDER — lidocaine HCl (XYLOCAINE) 2 % viscous soln Soln 15 mL
2 | Freq: Once | Status: AC
Start: 2013-12-06 — End: 2013-12-06

## 2013-12-06 MED ORDER — sodium chloride 0.9 % 1,000 mL bolus
Freq: Once | INTRAVENOUS | Status: AC
Start: 2013-12-06 — End: 2013-12-06
  Administered 2013-12-06: 20:00:00 via INTRAVENOUS

## 2013-12-06 MED ORDER — ondansetron (ZOFRAN) 4 mg/2 mL injection 4 mg
4 | Freq: Once | INTRAMUSCULAR | Status: AC
Start: 2013-12-06 — End: 2013-12-06
  Administered 2013-12-06: 21:00:00 4 mg via INTRAVENOUS

## 2013-12-06 MED ORDER — aluminum & magnesium hydroxide-simethicone (MYLANTA, MAALOX) suspension 30 mL
400-400-40 | Freq: Once | ORAL | Status: AC
Start: 2013-12-06 — End: 2013-12-06

## 2013-12-06 MED ORDER — morphine 5 mg/mL injection 5 mg
5 | Freq: Once | INTRAMUSCULAR | Status: AC
Start: 2013-12-06 — End: 2013-12-06
  Administered 2013-12-06: 20:00:00 5 mg via INTRAVENOUS

## 2013-12-06 MED ORDER — pantoprazole (PROTONIX) EC tablet 40 mg
40 | Freq: Once | ORAL | Status: AC
Start: 2013-12-06 — End: 2013-12-06

## 2013-12-06 MED FILL — MORPHINE 5 MG/ML INJECTION SOLUTION: 5 5 mg/mL | INTRAMUSCULAR | Qty: 1

## 2013-12-06 MED FILL — ONDANSETRON HCL (PF) 4 MG/2 ML INJECTION SOLUTION: 4 4 mg/2 mL | INTRAMUSCULAR | Qty: 2

## 2013-12-06 NOTE — Unmapped (Signed)
Pt complaining of pain in his chest for the past few months. States he uses heroin and alcohol to decrease the pain. Pt states he wants to figure out what is causing the pain so he can stop using heroin.

## 2013-12-06 NOTE — Unmapped (Signed)
Complains of chest pain from knot in chest.  Drank beer this morning trying to mask the pain.

## 2013-12-06 NOTE — Unmapped (Signed)
ED Screening Protocol- Yes

## 2013-12-06 NOTE — Unmapped (Signed)
[facility] ED Note    Reason for Visit: Chest Pain    12/06/2013    Patient History     HPI:  This is a 28 y.o. male with history of heroin and alcohol abuse, hypothyroid, bipolar and anxiety disorder, presenting for abdominal pain and chest pain.    Patient states he's been doing with abdominal pain and chest pain for many months.  He states this particular episode seems to have gotten worse over the past 3 days.  He describes a sharp stabbing pain in his abdomen and chest.  It appears to originate in the epigastric region and radiated into his chest.  He states he has nausea and vomiting and these things come and go.  He can identify nothing in particular that aggravates or alleviates his symptoms, as he states there randomly, and on their own.  Patient denies diarrhea, he states he's been eating and drinking normally.  His last heroin use was over a week ago, however his last drink was one hour prior to presentation in the ED and was a 20 ounce beer.  Patient denies fevers at home, cough, congestion.  Patient denies history of alcohol withdrawal.    Patient also reports concern with skin rash on his chest and abdomen.  He states he has a red skin rash that has been there for several weeks.  It is not itchy, it is not draining, it is not painful.       Past Medical History   Diagnosis Date   ??? Depression    ??? Drug abuse and dependence    ??? H/O tooth extraction    ??? Thyroid disease    ??? Substance induced mood disorder    ??? PTSD (post-traumatic stress disorder)    ??? Anxiety    ??? Bipolar disorder    ??? Bronchitis    ??? Liver disease      Patient unaware of actual dx       Past Surgical History   Procedure Laterality Date   ??? Mandible surgery         Alexander [name]  reports that he has been smoking Cigarettes.  He has a 22 pack-year smoking history. He does not have any smokeless tobacco history on file. He reports that he drinks about 1.8 ounces of alcohol per week.  He reports that he uses illicit drugs (Cocaine, Heroin, Other-see comments, and Marijuana).    Patient's Medications   New Prescriptions    No medications on file   Previous Medications    CITALOPRAM (CELEXA) 20 MG TABLET    Take 1 tablet (20 mg total) by mouth daily.    IBUPROFEN (ADVIL,MOTRIN) 600 MG TABLET    Take 1 tablet (600 mg total) by mouth every 6 hours as needed for Pain.   Modified Medications    No medications on file   Discontinued Medications    No medications on file       Allergies:   Allergies as of 12/06/2013   ??? (No Known Allergies)       Review of Systems     ROS:  12 point ROS negative unless noted in the HPI.      Physical Exam     ED Triage Vitals   Vital Signs Group      Temp 12/06/13 1248 97.7 ??F (36.5 ??C)      Temp Source 12/06/13 1248 Oral      Heart Rate 12/06/13 1248 84  Heart Rate Source 12/06/13 1248 Monitor      Resp 12/06/13 1248 24      BP 12/06/13 1248 149/87 mmHg      BP Location 12/06/13 1248 Right arm      BP Method 12/06/13 1248 Automatic      Patient Position 12/06/13 1248 Lying   SpO2 12/06/13 1248 100 %   O2 Device 12/06/13 1248 None (Room air)       General:  This is a WD/WN male in no acute distress who appears their stated age.    HEENT:  NC/AT.  PERRL.  OP clear.  MMM.    Neck:  Supple. Trachea midline. No JVD appreciated.    Pulmonary:   CTAB without adventitious sounds.    Cardiac:  RRR -m/g/r.    Abdomen:  Normoactive BS. Soft, nt/nd. No masses or organomegaly.    Musculoskeletal:  WWP with no clubbing, cyanosis, or deformities noted.    Vascular:  +2 radial pulses.    Skin:  There is an area of erythema on the patient's abdomen just superior to where the belt buckle rubs his skin.  He also has small area of erythema around the center inferior region of his chest just above the epigastrium.  There is a small subcutaneous 1 cm mobile, nontender nodule consistent with lipoma.  Patient states he has been poking at him picking this region of his skin.    Neuro:  AAO  to person, place, year.  CN 2-12 intact.  Strength 5/5 and symmetric throughout the upper and lower extremities. Sens intact to light touch bilaterally and symmetric. Cerebellar exam demonstrates normal RAM, finger-nose-finger. Normal gait.      Diagnostic Studies     Labs:    Please see electronic medical record for any tests performed in the ED     Radiology:    Please see electronic medical record for any tests performed in the ED    EKG:    Indication chest pain, Rate 81, Rhythm normal sinus, Interval PR 124 ms, QRS 70 ms, QTC 420 ms, Axis normal axis, ST Segment Change no acute ST segment elevation or depression and Comparison to prior EKG stable to prior    Emergency Department Procedures         ED Course and MDM     Alexander [name] is a 28 y.o. male with history of heroin and alcohol abuse, hypothyroid, bipolar and anxiety disorder, presenting for abdominal pain and chest pain.  The patient was evaluated by myself and the ED attending physician Alexander [name].  All care plans were discussed and agreed upon.    Initial history and physical exam were significant for well-appearing but slightly disheveled gentleman in no acute distress.  Clinically, he had a small subcutaneous nodule in his skin in the inferior portion of the chest.  It does not appear to be superinfected, is mobile, nontender, and appears lipomatous.  I doubt this is causing his pain.  With regard to his skin findings he appears to have contact dermatitis over his abdomen.  There is no evidence of superinfection.  I will not prescribe antibiotics, he was counseled on avoidance of skin irritants as well as removing his belt.  Concern at this time would be for alcoholic pancreatitis given the patient's abdominal symptoms.  As such laboratories were sent and are pending at the time of this note.  Otherwise the patient appears clinically well, we'll treat symptomatically with fluids and IV pain medicine and  await lab results.     At this time I will be  transitioning care to my colleague Alexander [name].  Responsibilities will include: Following up on the patient's laboratory studies as well as determining ultimate disposition.  Patient received both a chest x-ray and EKG which were normal, I have a low suspicion that his chest pain is cardiac in nature.  I think this is more epigastric abdominal pain radiating up into his chest.  It is flat and not consistent with pancreatitis, I think he can be treated conservatively and discharged safely.      Impression     Abdominal pain    Plan     1) The patient is transitioned to my colleague Alexander [name]  2)    Alexander [name], Alexander [name]   Home Medication Instructions [id number]    Printed on:12/06/13 1429   Medication Information                      citalopram (CELEXA) 20 MG tablet  Take 1 tablet (20 mg total) by mouth daily.             ibuprofen (ADVIL,MOTRIN) 600 MG tablet  Take 1 tablet (600 mg total) by mouth every 6 hours as needed for Pain.               3) The patient is instructed to return to the emergency department should his symptoms worsen or any concern he believes warrants acute evaluation.        Anne Fu, MD MPH   [facility]  Emergency Medicine PGY-2      Janace Aris, MD  Resident  12/06/13 [phone]

## 2013-12-06 NOTE — Unmapped (Signed)
ED Attending Attestation Note    Date of service:  12/06/2013    This patient was seen by the resident physician.  I have seen and examined the patient, agree with the workup, evaluation, management and diagnosis. The care plan has been discussed and I concur.  I have reviewed the ECG and concur with the resident's interpretation.    My assessment reveals a 28 y.o. male with a history of polysubstance use he presents complaining of chest pain as well as epigastric abdominal pain.  The patient describes the chest pain is localized to an area where he has a subcutaneous nodule that's been there for years.  He has not noted any redness or drainage associated with this.  In addition, patient complains of epigastric discomfort.  He admits to drinking and 24 ounce can of beer prior to coming to the emergency department.  He is also injected heroin, most recently yesterday.  No diarrhea.  On exam, the patient is afebrile.  Appears nontoxic.  No increased work of breathing.  Lungs are clear with good aeration throughout.  CV regular rate rhythm with no murmurs.  The patient has a 1 cm palpable nodule near his xiphoid process which is in the subcutaneous tissue.  No redness.  No palpable fluctuance or induration.  Abdomen is soft and nontender without organomegaly.Marland Kitchen

## 2013-12-06 NOTE — Unmapped (Signed)
Please do not use heroin or alcohol.  If you need help obtaining resources, please speak with our Child psychotherapist.  Likely her pain is exacerbated by use of heroin as this can affect the pain that is in her body.  Moderate drinking includes no more than one drink per day

## 2013-12-07 NOTE — Unmapped (Signed)
[facility] ED  Reassessment Note    Alexander [name] is a 28 y.o. male who presented to the emergency department on 12/06/2013. This patient was initially seen by an off-going provider and their care has been turned over to me. Please see the original provider's note for details regarding the initial history, physical exam and ED course.  At the time of turnover the following steps in the patient's evaluation were pending: follow up with lab results, reassessment    Labs unremarkable, did not appear to be cardiac in nature.  Pain more epigastric but with normal labs.  Likely related to alcohol abuse.  Patient also with resolving skin rash near belt buckle, appears secondary to nickel allergy that has been resolving since change of belt.    On reassessment, patient was pain free.  Counseled on appropriate etoh consumption      Clinical Impression:    1.  Non-cardiac chest pain/epigastric pain

## 2013-12-10 ENCOUNTER — Inpatient Hospital Stay
Admit: 2013-12-10 | Discharge: 2013-12-10 | Disposition: A | Discharge status: Other Acute Inpatient Hospital | Payer: PRIVATE HEALTH INSURANCE

## 2013-12-10 ENCOUNTER — Inpatient Hospital Stay
Admission: EM | Admit: 2013-12-10 | Discharge: 2013-12-12 | Disposition: A | Discharge status: Other Acute Inpatient Hospital | Payer: PRIVATE HEALTH INSURANCE

## 2013-12-10 DIAGNOSIS — T391X1A Poisoning by 4-Aminophenol derivatives, accidental (unintentional), initial encounter: Secondary | ICD-10-CM

## 2013-12-10 LAB — CBC
Hematocrit: 46 % (ref 38.5–50.0)
Hematocrit: 45 % (ref 38.5–50.0)
Hemoglobin: 15.9 g/dL (ref 13.2–17.1)
Hemoglobin: 15.5 g/dL (ref 13.2–17.1)
MCH: 31 pg (ref 27.0–33.0)
MCH: 30.5 pg (ref 27.0–33.0)
MCHC: 34.6 g/dL (ref 32.0–36.0)
MCHC: 34.6 g/dL (ref 32.0–36.0)
MCV: 89.6 fL (ref 80.0–100.0)
MCV: 88.3 fL (ref 80.0–100.0)
MPV: 9.7 fL (ref 7.5–11.5)
MPV: 9.2 fL (ref 7.5–11.5)
Platelets: 227 10*3/uL (ref 140–400)
Platelets: 212 10*3/uL (ref 140–400)
RBC: 5.14 10*6/uL (ref 4.20–5.80)
RBC: 5.09 10*6/uL (ref 4.20–5.80)
RDW: 13 % (ref 11.0–15.0)
RDW: 12.8 % (ref 11.0–15.0)
WBC: 9 10*3/uL (ref 3.8–10.8)
WBC: 8.7 10*3/uL (ref 3.8–10.8)

## 2013-12-10 LAB — VENOUS BLOOD GAS, LINE/SYRINGE
%HBO2-Line Draw: 86.5 % (ref 40.0–70.0)
Base Excess-Line Draw: 0.6 mmol/L (ref <=3.0)
CO2 Content-Line Draw: 26 mmol/L (ref 25–29)
Carboxyhgb-Line Draw: 3.9 % (ref 0.0–2.0)
HCO3-Line Draw: 25 mmol/L (ref 24–28)
Methemoglobin-Line Draw: 0.2 % (ref 0.0–1.5)
PCO2-Line Draw: 37 mm Hg (ref 41–51)
PH-Line Draw: 7.43 (ref 7.32–7.42)
PO2-Line Draw: 54 mm Hg (ref 25–40)
Reduced Hemoglobin-Line Draw: 9.4 % (ref 0.0–5.0)

## 2013-12-10 LAB — BASIC METABOLIC PANEL
Anion Gap: 9 mmol/L (ref 3–16)
BUN: 11 mg/dL (ref 7–25)
CO2: 25 mmol/L (ref 21–33)
Calcium: 9.1 mg/dL (ref 8.6–10.2)
Chloride: 107 mmol/L (ref 98–110)
Creatinine: 0.93 mg/dL (ref 0.50–1.30)
GFR MDRD Af Amer: 120 See note.
GFR MDRD Non Af Amer: 99 See note.
Glucose: 97 mg/dL (ref 65–99)
Osmolality, Calculated: 291 mOsm/kg (ref 278–305)
Potassium: 3.9 mmol/L (ref 3.5–5.3)
Sodium: 141 mmol/L (ref 135–146)

## 2013-12-10 LAB — RENAL FUNCTION PANEL W/EGFR
Albumin: 4.9 g/dL (ref 3.6–5.1)
Anion Gap: 14 mmol/L (ref 3–16)
BUN: 11 mg/dL (ref 7–25)
CO2: 27 mmol/L (ref 21–33)
Calcium: 9.3 mg/dL (ref 8.6–10.2)
Chloride: 105 mmol/L (ref 98–110)
Creatinine: 0.87 mg/dL (ref 0.50–1.30)
GFR MDRD Af Amer: 129 See note.
GFR MDRD Non Af Amer: 107 See note.
Glucose: 74 mg/dL (ref 65–99)
Osmolality, Calculated: 300 mOsm/kg (ref 278–305)
Phosphorus: 2.2 mg/dL (ref 2.5–4.5)
Potassium: 4.1 mmol/L (ref 3.5–5.3)
Sodium: 146 mmol/L (ref 135–146)

## 2013-12-10 LAB — SALICYLATE LEVEL: Salicylate Lvl: <10 mg/L (ref 100–299)

## 2013-12-10 LAB — URINALYSIS W/RFL TO MICROSCOPIC
Bilirubin, UA: NEGATIVE
Blood, UA: NEGATIVE
Glucose, UA: NEGATIVE mg/dL
Ketones, UA: NEGATIVE mg/dL
Leukocytes, UA: NEGATIVE
Nitrite, UA: NEGATIVE
Protein, UA: NEGATIVE mg/dL
Specific Gravity, UA: 1.011 (ref 1.005–1.035)
Urobilinogen, UA: <2 mg/dL (ref 0.2–1.9)
pH, UA: 6 (ref 5.0–8.0)

## 2013-12-10 LAB — LACTIC ACID, VENOUS, WHOLE BLOOD, UCMC: Lactate, Ven: 2.2 mmol/L (ref 0.5–1.6)

## 2013-12-10 LAB — DIFFERENTIAL
Basophils Absolute: 72 /uL (ref 0–200)
Basophils Absolute: 70 /uL (ref 0–200)
Basophils Relative: 0.8 % (ref 0.0–1.0)
Basophils Relative: 0.8 % (ref 0.0–1.0)
Eosinophils Absolute: 54 /uL (ref 15–500)
Eosinophils Absolute: 61 /uL (ref 15–500)
Eosinophils Relative: 0.6 % (ref 0.0–8.0)
Eosinophils Relative: 0.7 % (ref 0.0–8.0)
Lymphocytes Absolute: 1152 /uL (ref 850–3900)
Lymphocytes Absolute: 1157 /uL (ref 850–3900)
Lymphocytes Relative: 12.8 % (ref 15.0–45.0)
Lymphocytes Relative: 13.3 % (ref 15.0–45.0)
Monocytes Absolute: 513 /uL (ref 200–950)
Monocytes Absolute: 574 /uL (ref 200–950)
Monocytes Relative: 5.7 % (ref 0.0–12.0)
Monocytes Relative: 6.6 % (ref 0.0–12.0)
Neutrophils Absolute: 7209 /uL (ref 1500–7800)
Neutrophils Absolute: 6838 /uL (ref 1500–7800)
Neutrophils Relative: 80.1 % (ref 40.0–80.0)
Neutrophils Relative: 78.6 % (ref 40.0–80.0)

## 2013-12-10 LAB — LIPID PANEL
Cholesterol, Total: 145 mg/dL (ref 0–200)
HDL: 56 mg/dL (ref 30–60)
LDL Cholesterol: 70 mg/dL (ref 0–160)
Triglycerides: 94 mg/dL (ref 10–150)

## 2013-12-10 LAB — URINE DRUG SCREEN WITHOUT CONFIRMATION, STAT
Amphetamines UR, 1000 ng/mL Cutoff: NEGATIVE
Barbiturates UR, 300  ng/mL Cutoff: NEGATIVE
Benzodiazepines UR, 300 ng/mL Cutoff: POSITIVE
Cocaine UR, 300 ng/mL Cutoff: NEGATIVE
MDMA URINE: NEGATIVE
Methadone, UR, 300 ng/mL Cutoff: POSITIVE
Methamph, UR, 1000 ng/mL Cutoff: NEGATIVE
Opiates UR, 300 ng/mL Cutoff: NEGATIVE
Oxycodone UR: POSITIVE
Phencyclidine (PCP) UR, 25 ng/mL Cutoff: NEGATIVE
THC UR, 50 ng/mL Cutoff: POSITIVE
Tricyclic Antidepressants Screen,Urine 1000 ng/mL Cutoff: POSITIVE

## 2013-12-10 LAB — HEPATIC FUNCTION PANEL
ALT: 299 U/L (ref 13–69)
AST: 158 U/L (ref 14–65)
Albumin: 4.7 g/dL (ref 3.6–5.1)
Alkaline Phosphatase: 99 U/L (ref 40–115)
Bilirubin, Direct: 0 mg/dL (ref 0.0–0.2)
Total Bilirubin: 0.4 mg/dL (ref 0.2–1.2)
Total Protein: 7.7 g/dL (ref 6.2–8.3)

## 2013-12-10 LAB — TSH: TSH: 1.13 mIU/L (ref 0.45–4.50)

## 2013-12-10 LAB — PROTIME-INR
INR: 1 (ref 0.9–1.1)
Protime: 13.4 seconds (ref 11.6–14.4)

## 2013-12-10 LAB — MAGNESIUM: Magnesium: 1.9 mg/dL (ref 1.5–2.5)

## 2013-12-10 LAB — ACETAMINOPHEN LEVEL: Acetaminophen Level: 43 mg/L (ref 10–20)

## 2013-12-10 MED ORDER — acetylcysteine (ACETADOTE) 200 mg/mL (20 %) 10,760 mg in sodium chloride 0.45 % 200 mL infusion
200 | Freq: Once | INTRAVENOUS | Status: AC
Start: 2013-12-10 — End: 2013-12-10
  Administered 2013-12-10: 21:00:00 10760 mg/kg via INTRAVENOUS

## 2013-12-10 MED ORDER — ondansetron (ZOFRAN) tablet 4 mg
4 | Freq: Three times a day (TID) | ORAL | Status: AC | PRN
Start: 2013-12-10 — End: 2013-12-12

## 2013-12-10 MED ORDER — folic acid (FOLVITE) tablet 1 mg
1 | Freq: Every day | ORAL | Status: AC
Start: 2013-12-10 — End: 2013-12-12
  Administered 2013-12-11 – 2013-12-12 (×2): 1 mg via ORAL

## 2013-12-10 MED ORDER — folic acid (FOLVITE) tablet 1 mg
1 | Freq: Once | ORAL | Status: AC
Start: 2013-12-10 — End: 2013-12-10
  Administered 2013-12-10: 20:00:00 1 mg via ORAL

## 2013-12-10 MED ORDER — acetylcysteine (ACETADOTE) 200 mg/mL (20 %) 7,180 mg in sodium chloride 0.45 % 1,000 mL infusion
0.45 | Freq: Once | INTRAVENOUS | Status: AC
Start: 2013-12-10 — End: 2013-12-11
  Administered 2013-12-11: 03:00:00 7180 mg/kg via INTRAVENOUS

## 2013-12-10 MED ORDER — acetylcysteine (ACETADOTE) 200 mg/mL (20 %) 3,580 mg in sodium chloride 0.45 % 500 mL infusion
0.45 | Freq: Once | INTRAVENOUS | Status: AC
Start: 2013-12-10 — End: 2013-12-10
  Administered 2013-12-10: 22:00:00 3580 mg/kg via INTRAVENOUS

## 2013-12-10 MED ORDER — diazepam (VALIUM) tablet 5 mg
2 | Freq: Once | ORAL | Status: AC
Start: 2013-12-10 — End: 2013-12-10
  Administered 2013-12-10: 20:00:00 5 mg via ORAL

## 2013-12-10 MED ORDER — multivitamin (THERAGRAN) tablet 1 tablet
Freq: Once | ORAL | Status: AC
Start: 2013-12-10 — End: 2013-12-10
  Administered 2013-12-10: 20:00:00 1 via ORAL

## 2013-12-10 MED ORDER — flu vaccine (seasonal < 65 yr)(PF) IM 0.5 mL
INTRAMUSCULAR | Status: AC
Start: 2013-12-10 — End: 2013-12-11
  Administered 2013-12-11: 09:00:00 0.5 mL via INTRAMUSCULAR

## 2013-12-10 MED ORDER — heparin (porcine) injection 5,000 Units
5000 | Freq: Three times a day (TID) | INTRAMUSCULAR | Status: AC
Start: 2013-12-10 — End: 2013-12-12
  Administered 2013-12-11 (×3): 5000 [IU] via SUBCUTANEOUS

## 2013-12-10 MED ORDER — ondansetron (ZOFRAN) 4 mg/2 mL injection 4 mg
4 | Freq: Three times a day (TID) | INTRAMUSCULAR | Status: AC | PRN
Start: 2013-12-10 — End: 2013-12-12

## 2013-12-10 MED ORDER — multivitamin (THERAGRAN) tablet 1 tablet
Freq: Every day | ORAL | Status: AC
Start: 2013-12-10 — End: 2013-12-12
  Administered 2013-12-11 – 2013-12-12 (×2): 1 via ORAL

## 2013-12-10 MED ORDER — pneumococcal vaccine (PNEUMOVAX) injection 0.5 mL
25 | INTRAMUSCULAR | Status: AC
Start: 2013-12-10 — End: 2013-12-11
  Administered 2013-12-11: 09:00:00 0.5 mL via INTRAMUSCULAR

## 2013-12-10 MED ORDER — thiamine (VITAMIN B-1) tablet 100 mg
100 | Freq: Every day | ORAL | Status: AC
Start: 2013-12-10 — End: 2013-12-12
  Administered 2013-12-11 – 2013-12-12 (×2): 100 mg via ORAL

## 2013-12-10 MED ORDER — sodium chloride 0.9 % infusion
INTRAVENOUS | Status: AC
Start: 2013-12-10 — End: 2013-12-12
  Administered 2013-12-11 (×2): 125 mL/h via INTRAVENOUS

## 2013-12-10 MED ORDER — sodium chloride 0.9 % 1,000 mL bolus
Freq: Once | INTRAVENOUS | Status: AC
Start: 2013-12-10 — End: 2013-12-10
  Administered 2013-12-10: 20:00:00 via INTRAVENOUS

## 2013-12-10 MED ORDER — thiamine (VITAMIN B-1) tablet 100 mg
100 | Freq: Once | ORAL | Status: AC
Start: 2013-12-10 — End: 2013-12-10
  Administered 2013-12-10: 20:00:00 100 mg via ORAL

## 2013-12-10 MED FILL — ACETADOTE 200 MG/ML (20 %) INTRAVENOUS SOLUTION: 200 200 mg/mL (20 %) | INTRAVENOUS | Qty: 53.8

## 2013-12-10 MED FILL — ACETADOTE 200 MG/ML (20 %) INTRAVENOUS SOLUTION: 200 200 mg/mL (20 %) | INTRAVENOUS | Qty: 17.9

## 2013-12-10 MED FILL — THERA 400 MCG TABLET: 400 400 mcg | ORAL | Qty: 1

## 2013-12-10 MED FILL — ACETADOTE 200 MG/ML (20 %) INTRAVENOUS SOLUTION: 200 200 mg/mL (20 %) | INTRAVENOUS | Qty: 35.9

## 2013-12-10 MED FILL — DIAZEPAM 2 MG TABLET: 2 2 MG | ORAL | Qty: 3

## 2013-12-10 MED FILL — THIAMINE HCL (VITAMIN B1) 100 MG TABLET: 100 100 MG | ORAL | Qty: 1

## 2013-12-10 MED FILL — FOLIC ACID 1 MG TABLET: 1 1 MG | ORAL | Qty: 1

## 2013-12-10 MED FILL — HEPARIN (PORCINE) 5,000 UNIT/ML INJECTION SOLUTION: 5000 5,000 unit/mL | INTRAMUSCULAR | Qty: 1

## 2013-12-10 NOTE — Unmapped (Signed)
28 y o white male interviewed at 1250, stated that he had taken 25 Tylenol PM and 5 Percocet last night in his apartment he shares with girlfriend and her three teenage daughters ages 62, 34 & 36.  Patient said he suffers from PTSD due to his brothers' deaths from OD and GSW successful suicide.  Patient is alert and oriented, states he smokes 2 packs/day, drinks a 12 pack/beer a day.  Patient denies SI/HI and AVH presently.  Patient stated he was not taking psychiatric medication and would like to get a prescription for his meds.

## 2013-12-10 NOTE — Unmapped (Signed)
rn busy will cal me back asap, pt made aware,

## 2013-12-10 NOTE — Unmapped (Signed)
I interviewed and examined patient with the resident, discussed problems and plans with resident, resident's note reflect my assessment and plan as discussed during rounds, agree with physical exam and assessment plan notes as detailed above.  Patient care time spent 50 minutes including direct bedside patient exam, reviewing labs and imaging and more than half the time explaining problems and plans to patient and family at bedside/or on phone;  medication reconciliation and documentation.    Dayyan Krist MD  Attending- Internal Medicine

## 2013-12-10 NOTE — Unmapped (Signed)
[facility] here and will transport to [facility] for medical work up.

## 2013-12-10 NOTE — Unmapped (Signed)
Physician, Dr. Alvester Morin, informed of overdose patient stated he had taken last night.  Multiple blood draws and urine collected completed; will send with patient to [facility]. Patient lying on stretcher, informed of transfer to [facility], explained he is transferred because of Tylenol overdose and he voiced an understanding.  Report called to Galen Daft, Press photographer at [employer], at [phone].  Patient signed consent for transfer.

## 2013-12-10 NOTE — Unmapped (Signed)
Patient has cigarettes, lighter and a can of grizzly chew in locked cabinet in med room.

## 2013-12-10 NOTE — Unmapped (Signed)
Briefly, Alexander [name] is a 28 y.o. male who presented to the emergency department with Suicidal      Patient was initially evaluated by my colleague. Please refer to prior note for the initial history and physical exam.    Diagnostic Studies     Labs:  Please see electronic medical record for any tests performed in the ED    Radiology:  Please see electronic medical record for any tests performed in the ED    EKG:  Please refer to prior documentation      Emergency Department Procedures     None performed    ED Course and MDM     Alexander [name] is a 28 y.o. male who presented to the emergency department with Suicidal      Patient was initially evaluated by Dr. Elias Else. Please refer to prior documentation for details of the initial history, physical exam, assessment and plan. Patient was signed out to me pending discussion with [facility].     The patient's Tylenol level was elevated to 43 milligrams per liter 13 hours after ingestion.  This places him above the treatment In the [facility]twon Rochin nomogram.  I spoke with the PICU they recommended further treatment and admission.  The patient was admitted to the medicine service for further management of his Tylenol overdose.  He received his initial dose of N-acetylcysteine in the emergency department.  He did experience some flushing, which is unknown reaction from the N-acetylcysteine.    The medicine team was informed of the patient's history, physical, diagnostic studies, and emergency department treatment.  He was transferred to the floor in stable condition.     Clinical Impression:  Tylenol overdose

## 2013-12-10 NOTE — Unmapped (Signed)
ED Attending Attestation Note    Date of service:  12/10/2013    This patient was seen by the resident physician.  I have seen and examined the patient, agree with the workup, evaluation, management and diagnosis. The care plan has been discussed and I concur.  I have reviewed the ECG and concur with the resident's interpretation.    My assessment reveals a 28 y.o. male Alexander [name] after taking Tylenol and Percocet last night.  He is awake alert he is on a psychiatric hold.

## 2013-12-10 NOTE — Unmapped (Signed)
Pt still unable to urinate, pt aware of need, will try again later, pt sts I just went to the bathroom before I left the other hosp, pt c/o abd pain, md aware meds held d/t overdose status,

## 2013-12-10 NOTE — Unmapped (Signed)
Pt face red, and feels flushed, md and pharmacy aware, this is a common rxn  pt denies sob, no cp, no itching or rashes noted, pt calm, cheeks feel warm to touch, no diaphoresis noted, pt advised to notify staff if experiencing urticaria, pt aware and agreeable, pt NSR per monitor, no ectopy noted, resp reg easy even unlabored, pt able to speak in complete sentences clearly without difficulty,

## 2013-12-10 NOTE — Unmapped (Signed)
Transferred to [facility] for evaluatioon of reported tylenol overdose.

## 2013-12-10 NOTE — Unmapped (Signed)
The psw is aware of this pt and chart review in progress.    Falicia Lizotte LISW

## 2013-12-10 NOTE — Unmapped (Signed)
Pt admitted to [facility] and placed on spo2/ecg monitor at ~1920. Pt on room air, alert and oriented x4.  Moved to bed under own power.    -Pt states that at this time, he has no desire to harm himself.  Sitter at bedside.   -4hr dose of acetadote infusing.  Normal saline hanging to gravity.      Normal diet order; pt now eating a meal.

## 2013-12-10 NOTE — Unmapped (Signed)
Psych Social Work Brief Note      Presentation: The pt is a 28 year old caucasain male who presented to [facility] on a hold per police as this pt attempted to end his life via overdose.  Per the pt, the pt took 25 Tylenol and 5 Percocet mixed with alcohol.  The pt reports feeling very depressed and that he thinks about ending his life everyday.  The pt reports multiple prior attempts.  The pt reports that he attempted to hang himself in 2009 after one of his brothers killed themselves.  The pt reported that his other brother took his life as well.        Current Mental Status: the pt is alert and looks somewhat shakey.  The pt's faces twitches somewhat when talking.  The pt's mood is depressed.        Substance Use: The pt reports that he quit using heroin one week ago.  The pt reports a long hx of drug use.  The pt reports that he drinks at the least a 12 pack of beer daily.        History of Mental Health Treatment: The pt reports that he was last hospitalized in [place] for psychiatric issues.  The pt reports multiple suicide attempts.  The pt reports that the first time he tried to hang himself was after his brother killed himself and then his other brother later killed himself.  The pt reports that he is supposed to be on Lithium, Klonopin, and Celexa.        Collateral: The pt reports that he lives with his gf [name] but her phone is turned off.      Formulation of Plan: the pt is agreeable to returning to [facility] once medically stable.    Patient Reaction to Plan: agreeable to plan  Handoff of Communication: Gave report to [facility].      Transportation Plan: This pt is not ready for transfer at this time.    Maryan Rued LISW

## 2013-12-10 NOTE — Unmapped (Signed)
Pt calm, md at bedside, pt no new c/o at present,

## 2013-12-10 NOTE — Unmapped (Signed)
Department of Internal Medicine  History & Physical    Patient: Alexander [name]  MRN: [id number]  CSN: [id number]    Chief Complaint     Just wanting to end it all    History of Present Illness     HPI: Alexander [name] is a 28 y.o. male with a PMH of polysubstance abuse and depression who presented to the emergency department with a chief complaint of taking 25 Tylenol PM's, 5 5 mg Percocet, and a twelve pack of beer around 11 PM yesterday (12/09/12) evening. Patient said that he was depressed and wanted to end it all. He had one episode of vomiting after ingestion, but he has not vomited since and there were no pills in the vomit. Patient was initially taken to [facility], but when his Tylenol ingestion was discovered, he was taken to the ED at [facility]. Patient denies any recent illness, SOB, or CP. Patient does endorse some abdominal pain. Patient says that he has had a lot of increased stress recently, and he has been drinking more as he tries to quit heroin. Patient has not used heroine in a few days, drinks a 12 pack a beer daily, and does almost everything else. He has not used any benzos in a long time, and he has not used cocaine in days either. Patient has been depressed all his life, Both of his brothers committed suicide and his father died.    In the ED, patient was started on N-acetylcysteine, given Valium, and a rally pack. Patient was alert and oriented and denied current SI/HI.       Review of Systems     Review of Systems   Constitutional: Negative for fever and chills.   HENT: Negative for sore throat.    Eyes: Negative for blurred vision.   Respiratory: Negative for cough, sputum production, shortness of breath and wheezing.    Cardiovascular: Negative for chest pain and palpitations.   Gastrointestinal: Positive for abdominal pain. Negative for nausea, vomiting, diarrhea and constipation.   Genitourinary: Negative for dysuria, urgency and frequency.   Musculoskeletal: Negative for back pain,  myalgias and neck pain.   Skin: Negative for rash.   Neurological: Negative for dizziness, tremors, seizures, weakness and headaches.   Psychiatric/Behavioral: Positive for depression, suicidal ideas and substance abuse. The patient is nervous/anxious and has insomnia.            Past Medical History     Past Medical History   Diagnosis Date   ??? Depression    ??? Drug abuse and dependence    ??? H/O tooth extraction    ??? Thyroid disease    ??? Substance induced mood disorder    ??? PTSD (post-traumatic stress disorder)    ??? Anxiety    ??? Bipolar disorder    ??? Bronchitis    ??? Liver disease      Patient unaware of actual dx         Past Surgical History     Past Surgical History   Procedure Laterality Date   ??? Mandible surgery           Family History     Family History   Problem Relation Age of Onset   ??? Drug abuse Brother    ??? Hypertension Mother    ??? Transient ischemic attack Mother    ??? Seizures Father    ??? Hypertension Father    ??? Cancer Father  Social History     History     Social History   ??? Marital Status: Single     Spouse Name: N/A     Number of Children: N/A   ??? Years of Education: N/A     Occupational History   ??? Not on file.     Social History Main Topics   ??? Smoking status: Current Every Day Smoker -- 2.00 packs/day for 11 years     Types: Cigarettes   ??? Smokeless tobacco: Not on file   ??? Alcohol Use: 50.4 oz/week     84 Cans of beer per week      Comment: last drank 2 days ago   ??? Drug Use: Yes     Special: Cocaine, Heroin, Other-see comments, Marijuana      Comment: most recently using heroin   ??? Sexual Activity: Yes     Partners: Female, Male     Birth Control/ Protection: Condom, Other-see comments     Other Topics Concern   ??? Caffeine Use Yes   ??? Occupational Exposure No   ??? Exercise No   ??? Seat Belt Yes     Social History Narrative   ??? No narrative on file         Medications     Home Meds:  Prior to Admission medications    Medication Sig Start Date End Date Taking? Authorizing Provider    citalopram (CELEXA) 20 MG tablet Take 1 tablet (20 mg total) by mouth daily. 11/17/13   Philis Kendall, MD   ibuprofen (ADVIL,MOTRIN) 600 MG tablet Take 1 tablet (600 mg total) by mouth every 6 hours as needed for Pain. 08/30/13   Lala Lund, MD   lithium carbonate 600 MG capsule Take 600 mg by mouth 2 times a day with meals.    Historical Provider, MD          Inpatient Meds:  Scheduled:  ??? acetylcysteine (ACETADOTE) *third dose* infusion  100 mg/kg Intravenous Once   ??? heparin (porcine)  5,000 Units Subcutaneous Q8H       Continuous:  ??? sodium chloride           ZOX:[id number], ondansetron      Vital Signs     Temp:  [97.8 ??F (36.6 ??C)-98.5 ??F (36.9 ??C)] 97.9 ??F (36.6 ??C)  Heart Rate:  [74-110] 74  Resp:  [16-21] 21  BP: (135-158)/(73-95) 144/73 mmHg  No intake or output data in the 24 hours ending 12/10/13 2108        Physical Exam     GEN: young WM laying in bed appearing nervous, NAD  CARDIO: RRR. S1+S2. No m/g/r.  VASC: Radials 2+. PTs 2+. No JVD. No carotid Bruit. No peripheral edema.  PULM: CTAB. No wheezes/rhonchi/rales. Symmetric expansion. No accessory muscle use noted.   NEURO:  No focal deficits appreciated. Alert and oriented X3.   HEENT: NCAT. PERRL. No conjunctival injection/scleral icterus. Gross auditory acuity.   ABD: Soft. Diffusely tender to palpation. No guarding or rebound. BS present X4. No organomegaly appreciated.    MSK: Full ROM of BUE and BLE otherwise grossly normal.   EXTREM: Atraumatic. No cyanosis/clubbing of nails noted.   SKIN: No redness/rashes. Multiple tatoos.      Laboratory Data         Lab 12/10/13  1409 12/10/13  1310 12/06/13  1500   WBC 8.7 9.0 6.8   HEMOGLOBIN 15.5 15.9 17.1   HEMATOCRIT 45.0 46.0 50.0  MEAN CORPUSCULAR VOLUME 88.3 89.6 89.0   PLATELETS 212 227 236           Lab 12/10/13  1409 12/10/13  1310 12/06/13  1500   SODIUM 141 146 149*   POTASSIUM 3.9 4.1 4.4   CHLORIDE 107 105 105   CO2 25 27 26    BUN 11 11 9    CREATININE 0.93 0.87 0.92   GLUCOSE 97 74  91           Lab 12/10/13  1409 12/10/13  1310 12/06/13  1500   CALCIUM 9.1 9.3 9.4   MAGNESIUM 1.9  --   --    PHOSPHORUS  --  2.2*  --            Lab 12/10/13  1409   INR 1.0   PROTHROMBIN TIME 13.4           Lab 12/10/13  1409 12/10/13  1310 12/06/13  1500   ALT 299*  --  398*   AST 158*  --  195*   ALK PHOS 99  --  127*   BILIRUBIN TOTAL 0.4  --  0.5   BILIRUBIN DIRECT 0.0  --  0.0   ALBUMIN 4.7 4.9 4.8           Lab 12/10/13  1310 12/06/13  1451   COLOR, URINE Straw Yellow   CLARITY Clear Clear   PROTEIN UA Negative Negative   PH UA 6.0 7.0   SPECIFIC GRAVITY, URINE 1.011 1.015   GLUCOSE UA Negative Negative   BLOOD UA Negative Negative   LEUKOCYTES UA Negative Negative   NITRITE UA Negative Negative   BILIRUBIN UA Negative Negative   UROBILINOGEN UA <2.0 0.2 E.U./dL             No results found for this basename: [id number]       Lab Results   Component Value Date    TSH 1.13 12/10/2013    FREET4 1.08 11/14/2013                 Diagnostic Studies   PA and lateral views of the chest dated 12/06/2013 2:19 PM.   Indication: Chest Pain - EMR Diagnosis:   Comparison: prior chest radiographs   Findings:   The cardiomediastinal silhouette is within normal limits. The lungs are clear. There is no evidence of pneumothorax or pleural effusion.   IMPRESSION:   No acute cardiopulmonary disease.        Assessment & Plan     Alexander [name] is a 28 y.o. male with <principal problem not specified>. Medical problems being addressed in this encounter include the following:     Active Problems:    Tylenol overdose      Tylenol Overdose  -Patient started on acetylcysteine 21 hours per protocol, currently on the 2nd 4 hour bag  -most recent tylenol level 43, per poison control will need to recheck a tylenol level as well as LFT's at 11 am, if acetaminophen level not 0 and LFT's not trending down at 11 am, then will hang second 16 hour bag. (2 hours before end of 16 hour bag)  -NS at 125 cc/hr  -Zofran PO or IV for N/V  -q4 neuro  checks    Suicide Attempt  -patient not currently actively suicidal  -sitter at bedside  -psychiatry consult  -will need to go back to [facility] once medically stable    Alcohol Withdrawal  -patient on CIWA and may go  into alcohol withdrawal  -will consider starting patient on Valium or Ativan tomorrow  -currently no signs of withdrawal. Patient has had tremors in the past, but he has never had seizures or been intubated for withdrawal.         Nutrition:  Diet Orders         Diet regular starting at 01/06 1945          Code Status: Full Code    Signed:  Suan Halter, MD  12/10/2013, 9:08 PM

## 2013-12-10 NOTE — Unmapped (Addendum)
Pt face no longer red, denies flushed feeling, pt calm, resp reg easy even unlabored, pt NSR per monitor, no ectopy noted,

## 2013-12-10 NOTE — Unmapped (Signed)
MD at bedside. Admit team at bedside, pt calm,

## 2013-12-10 NOTE — Unmapped (Signed)
[facility] ED Note    Reason for Visit: Suicidal      Patient History     HPI:  Alexander [name] is a 28 y.o. male who presented to the emergency department with a chief complaint of taking 25 Tylenol PMs and 5 Percocet around 11 PM yesterday evening along with drinking more heavily than he normally does.  The patient has been here multiple times for psychiatric complaints and admits to taking the Tylenol in order to kill himself. He had one episode of vomiting after ingestion however has not vomited since and there were no pills in the vomit.  He walks down to the fire station this morning who then took him to [place].  After him what he ingested they sent him over here.  The patient is awake alert and able to tell me what is going on.  He complains of mild stomach pain and otherwise feels at his baseline.    Past Medical History   Diagnosis Date   ??? Depression    ??? Drug abuse and dependence    ??? H/O tooth extraction    ??? Thyroid disease    ??? Substance induced mood disorder    ??? PTSD (post-traumatic stress disorder)    ??? Anxiety    ??? Bipolar disorder    ??? Bronchitis    ??? Liver disease      Patient unaware of actual dx       Past Surgical History   Procedure Laterality Date   ??? Mandible surgery         Alexander [name]  reports that he has been smoking Cigarettes.  He has a 22 pack-year smoking history. He does not have any smokeless tobacco history on file. He reports that he drinks about 50.4 ounces of alcohol per week. He reports that he uses illicit drugs (Cocaine, Heroin, Other-see comments, and Marijuana).    Previous Medications    CITALOPRAM (CELEXA) 20 MG TABLET    Take 1 tablet (20 mg total) by mouth daily.    IBUPROFEN (ADVIL,MOTRIN) 600 MG TABLET    Take 1 tablet (600 mg total) by mouth every 6 hours as needed for Pain.    LITHIUM CARBONATE 600 MG CAPSULE    Take 600 mg by mouth 2 times a day with meals.       Allergies:   Allergies as of 12/10/2013   ???  (No Known Allergies)       Review of Systems     ROS: A complete review of systems was accomplished and was negative except for that stated above.    Physical Exam       General:  Awake alert  HEENT:  Normocephalic, atraumatic, PERRL, extra-ocular movements intact, oropharynx benign  Neck:  Supple, no lymphadenopathy, trachea midline, no masses  Pulmonary:   Clear to auscultation bilaterally, good air movement, no wheezes  Cardiac: Tachycardic with a regular rhythm, no M/R/G  Abdomen:  Soft, mildly tender to palpation, non-distended, no rebounding or guarding.  Musculoskeletal:  2+ pulses, no edema or clubbing  Skin:  No concerning rashes or lesions, no cyanosis  Neuro: Alert and oriented X 4  Psych: Depressed, poor judgment and insight, endorses suicidal ideation and attempt      Diagnostic Studies     Labs:  Labs Reviewed   DIFFERENTIAL - Abnormal; Notable for the following:     Lymphocytes Relative 13.3 (*)     All other components within normal limits  HEPATIC FUNCTION PANEL - Abnormal; Notable for the following:     AST 158 (*)     ALT 299 (*)     All other components within normal limits   ACETAMINOPHEN LEVEL - Abnormal; Notable for the following:     Acetaminophen Level 43 (*)     All other components within normal limits   SALICYLATE LEVEL - Abnormal; Notable for the following:     Salicylate Lvl <10 (*)     All other components within normal limits   LACTIC ACID, VENOUS, WHOLE BLOOD, [facility] - Abnormal; Notable for the following:     Lactate, Ven 2.2 (*)     All other components within normal limits   VENOUS BLOOD GAS, LINE/SYRINGE - Abnormal; Notable for the following:     PH-Line Draw 7.43 (*)     PCO2-Line Draw 37 (*)     PO2-Line Draw 54 (*)     %HBO2-Line Draw 86.5 (*)     Carboxyhgb-Line Draw 3.9 (*)     Reduced Hemoglobin-Line Draw 9.4 (*)     All other components within normal limits   BASIC METABOLIC PANEL   CBC   PROTIME-INR   MAGNESIUM   URINE DRUG SCREEN, STAT     Radiology:    No tests were  performed during this ED visit    Emergency Department Procedures     ED Course and Medical-Decision-Making   Alexander [name] is a 28 y.o. male who presented to the emergency department with a chief complaint of Suicidal   as described in the history of present illness. Nursing notes were reviewed, applicable prior records were reviewed, and a full history and physical examination was performed by me in collaboration with the attending physician.    Patient presented after a significant Tylenol overdose, assuming that the Tylenol he took was 500 mg pills he has taken 12.5 g of Tylenol p.m. alone.  Plus he took 5 Percocet the top of this and is a chronic alcoholic with hepatitis C.  His potential for liver toxicity is very high.  Given all of these facts and being 11 hours out from his toxicity with abdominal pain, N-acetylcysteine was ordered empirically along with a rally pack, Valium for concern for alcohol withdrawal and a liter of fluids for his lactate of 2.1.  The patient will be signed out to Dr. Clare Gandy who followup on his Tylenol level and treatment plan.  The patient was discussed with [facility] who agreed with the empiric treatment of his Tylenol ingestion.    A psychiatric hold was signed on the patient by [facility] before being transferred here and a sitter is present at bedside.  The patient will be evaluated by psychiatry before being discharged from either this hospital or [facility].    ED Treatment:  Medications   acetylcysteine (ACETADOTE) 200 mg/mL (20 %) 150 mg/kg in sodium chloride 0.45 % 200 mL infusion (not administered)     Followed by   acetylcysteine (ACETADOTE) 200 mg/mL (20 %) 50 mg/kg in sodium chloride 0.45 % 500 mL infusion (not administered)     Followed by   acetylcysteine (ACETADOTE) 200 mg/mL (20 %) 100 mg/kg in sodium chloride 0.45 % 1,000 mL infusion (not administered)         Disposition & Plan:    Dr. Clare Gandy to follow-up on labs, dispo, and response to treatment.       Summary of  Treatment D:  Medications   acetylcysteine (ACETADOTE) 200 mg/mL (20 %)  150 mg/kg in sodium chloride 0.45 % 200 mL infusion (not administered)     Followed by   acetylcysteine (ACETADOTE) 200 mg/mL (20 %) 50 mg/kg in sodium chloride 0.45 % 500 mL infusion (not administered)     Followed by   acetylcysteine (ACETADOTE) 200 mg/mL (20 %) 100 mg/kg in sodium chloride 0.45 % 1,000 mL infusion (not administered)       Clinical Impression:   1. acetaminophen overdose    Consults:  Social work        Isabel Caprice, MD  Resident  12/10/13 [phone]

## 2013-12-10 NOTE — Unmapped (Signed)
Dr. Alvester Morin informed of possible overdose.  Labs noted.

## 2013-12-11 LAB — CBC
Hematocrit: 41 % (ref 38.5–50.0)
Hematocrit: 44.2 % (ref 38.5–50.0)
Hemoglobin: 14.2 g/dL (ref 13.2–17.1)
Hemoglobin: 15.1 g/dL (ref 13.2–17.1)
MCH: 30.7 pg (ref 27.0–33.0)
MCH: 30.5 pg (ref 27.0–33.0)
MCHC: 34.7 g/dL (ref 32.0–36.0)
MCHC: 34.3 g/dL (ref 32.0–36.0)
MCV: 88.5 fL (ref 80.0–100.0)
MCV: 88.9 fL (ref 80.0–100.0)
MPV: 9.6 fL (ref 7.5–11.5)
MPV: 9.5 fL (ref 7.5–11.5)
Platelets: 192 10*3/uL (ref 140–400)
Platelets: 174 10E3/uL (ref 140–400)
RBC: 4.63 10*6/uL (ref 4.20–5.80)
RBC: 4.97 10E6/uL (ref 4.20–5.80)
RDW: 12.6 % (ref 11.0–15.0)
RDW: 12.9 % (ref 11.0–15.0)
WBC: 9.1 10*3/uL (ref 3.8–10.8)
WBC: 6.3 10E3/uL (ref 3.8–10.8)

## 2013-12-11 LAB — HEPATIC FUNCTION PANEL
ALT: 285 U/L — ABNORMAL HIGH (ref 13–69)
AST: 184 U/L — ABNORMAL HIGH (ref 14–65)
Albumin: 4.1 g/dL (ref 3.6–5.1)
Alkaline Phosphatase: 86 U/L (ref 40–115)
Bilirubin, Direct: 0 mg/dL (ref 0.0–0.2)
Total Bilirubin: 0.7 mg/dL (ref 0.2–1.2)
Total Protein: 7 g/dL (ref 6.2–8.3)

## 2013-12-11 LAB — DIFFERENTIAL
Basophils Absolute: 36 /uL (ref 0–200)
Basophils Relative: 0.4 % (ref 0.0–1.0)
Eosinophils Absolute: 100 /uL (ref 15–500)
Eosinophils Relative: 1.1 % (ref 0.0–8.0)
Lymphocytes Absolute: 1502 /uL (ref 850–3900)
Lymphocytes Relative: 16.5 % (ref 15.0–45.0)
Monocytes Absolute: 555 /uL (ref 200–950)
Monocytes Relative: 6.1 % (ref 0.0–12.0)
Neutrophils Absolute: 6907 /uL (ref 1500–7800)
Neutrophils Relative: 75.9 % (ref 40.0–80.0)

## 2013-12-11 LAB — BASIC METABOLIC PANEL
Anion Gap: 9 mmol/L (ref 3–16)
BUN: 10 mg/dL (ref 7–25)
CO2: 24 mmol/L (ref 21–33)
Calcium: 8.4 mg/dL — ABNORMAL LOW (ref 8.6–10.2)
Chloride: 109 mmol/L (ref 98–110)
Creatinine: 0.95 mg/dL (ref 0.50–1.30)
GFR MDRD Af Amer: 117 See note.
GFR MDRD Non Af Amer: 97 See note.
Glucose: 101 mg/dL — ABNORMAL HIGH (ref 65–99)
Osmolality, Calculated: 293 mosm/kg (ref 278–305)
Potassium: 3.8 mmol/L (ref 3.5–5.3)
Sodium: 142 mmol/L (ref 135–146)

## 2013-12-11 LAB — LACTIC ACID, VENOUS, WHOLE BLOOD, UCMC: Lactate, Ven: 0.8 mmol/L (ref 0.5–2.2)

## 2013-12-11 LAB — ACETAMINOPHEN LEVEL: Acetaminophen Level: <10 mg/L — ABNORMAL LOW (ref 10–20)

## 2013-12-11 LAB — PROTIME-INR
INR: 1.1 (ref 0.9–1.1)
Protime: 13.7 s (ref 11.6–14.4)

## 2013-12-11 LAB — HIV 1+2 ANTIBODY/ANTIGEN WITH REFLEX: HIV 1+2 AB/AGN: NONREACTIVE

## 2013-12-11 LAB — MAGNESIUM: Magnesium: 1.8 mg/dL (ref 1.5–2.5)

## 2013-12-11 LAB — HEPATITIS C ANTIBODY: HCV Ab: REACTIVE — AB

## 2013-12-11 LAB — LACTIC ACID: Lactate: 1.1 mmol/L (ref 0.5–2.2)

## 2013-12-11 MED ORDER — diazepam (VALIUM) tablet 5 mg
5 | Freq: Once | ORAL | Status: AC
Start: 2013-12-11 — End: 2013-12-11
  Administered 2013-12-11: 15:00:00 5 mg via ORAL

## 2013-12-11 MED ORDER — LORazepam (ATIVAN) tablet 1 mg
1 | ORAL | Status: AC | PRN
Start: 2013-12-11 — End: 2013-12-12

## 2013-12-11 MED ORDER — lamoTRIgine (LAMICTAL) tablet 25 mg
25 | Freq: Every day | ORAL | Status: AC
Start: 2013-12-11 — End: 2013-12-12
  Administered 2013-12-11 – 2013-12-12 (×2): 25 mg via ORAL

## 2013-12-11 MED ORDER — LORazepam (ATIVAN) tablet 0.5 mg
0.5 | ORAL | Status: AC | PRN
Start: 2013-12-11 — End: 2013-12-12

## 2013-12-11 MED ORDER — nicotine (polacrilex) (NICORETTE) gum 2 mg
2 | BUCCAL | Status: AC | PRN
Start: 2013-12-11 — End: 2013-12-11
  Administered 2013-12-11 (×4): 2 mg via ORAL

## 2013-12-11 MED ORDER — LORazepam (ATIVAN) tablet 2 mg
1 | ORAL | Status: AC | PRN
Start: 2013-12-11 — End: 2013-12-12

## 2013-12-11 MED ORDER — ibuprofen (ADVIL,MOTRIN) tablet 600 mg
200 | ORAL | Status: AC | PRN
Start: 2013-12-11 — End: 2013-12-12
  Administered 2013-12-11 (×2): 600 mg via ORAL

## 2013-12-11 MED ORDER — nicotine (polacrilex) (NICORETTE/NICORELIEF) gum 2 mg
2 | BUCCAL | Status: AC | PRN
Start: 2013-12-11 — End: 2013-12-12
  Administered 2013-12-12 (×3): 2 mg via ORAL

## 2013-12-11 MED ORDER — diazepam (VALIUM) tablet 2 mg
2 | Freq: Three times a day (TID) | ORAL | Status: AC | PRN
Start: 2013-12-11 — End: 2013-12-12
  Administered 2013-12-11 – 2013-12-12 (×3): 2 mg via ORAL

## 2013-12-11 MED FILL — LAMOTRIGINE 25 MG TABLET: 25 25 MG | ORAL | Qty: 1

## 2013-12-11 MED FILL — NICORELIEF 2 MG GUM: 2 2 mg | BUCCAL | Qty: 1

## 2013-12-11 MED FILL — FOLIC ACID 1 MG TABLET: 1 1 MG | ORAL | Qty: 1

## 2013-12-11 MED FILL — THERA 400 MCG TABLET: 400 400 mcg | ORAL | Qty: 1

## 2013-12-11 MED FILL — DIAZEPAM 2 MG TABLET: 2 2 MG | ORAL | Qty: 1

## 2013-12-11 MED FILL — IBUPROFEN 600 MG TABLET: 600 600 MG | ORAL | Qty: 1

## 2013-12-11 MED FILL — HEPARIN (PORCINE) 5,000 UNIT/ML INJECTION SOLUTION: 5000 5,000 unit/mL | INTRAMUSCULAR | Qty: 1

## 2013-12-11 MED FILL — FLUZONE 2013-2014(PF) 45 MCG (15 MCG X 3)/0.5 ML INTRAMUSCULAR SYRINGE: 45 45 mcg (15 mcg x 3)/0.5 mL | INTRAMUSCULAR | Qty: 0.5

## 2013-12-11 MED FILL — DIAZEPAM 5 MG TABLET: 5 5 MG | ORAL | Qty: 1

## 2013-12-11 MED FILL — PNEUMOVAX-23 25 MCG/0.5 ML INJECTION SOLUTION: 25 25 mcg/0.5 mL | INTRAMUSCULAR | Qty: 0.5

## 2013-12-11 MED FILL — THIAMINE HCL (VITAMIN B1) 100 MG TABLET: 100 100 MG | ORAL | Qty: 1

## 2013-12-11 NOTE — Unmapped (Signed)
Internal Medicine Attending Progress Note    I have seen and examined Alexander [name] with the resident during teaching rounds on 12/11/2013. I have conducted an independent review of the relevant notes, labs, imaging and procedures through the electronic medical record. I concur with the documentation and plan.    Major depressive episode with suicide attempt, multiple attempts in recent past, seems high risk for repeat attempt. Psychiatry consulted, app recs.     Will likely be medically ready for discharge tomorrow.     Illyanna Petillo, MD  Attending Physician  Department of Internal Medicine  Pager ID [phone])  12/11/2013, 5:37 PM

## 2013-12-11 NOTE — Unmapped (Signed)
Called MD Sorkin requesting Nicorette for patient. Informed MD that patients CIWA scores are gradually increasing. MD placing order for Nicorette.

## 2013-12-11 NOTE — Unmapped (Signed)
Pt transferred to 8S room 8219. Report called and given Billie RN. All pt belongings and medications packed and sent with pt to new room. This is including belongings that were locked in medicine supply cabinet. Pt chart sent to new room. Pt transported by [employer] in wheelchair, on monitor. Pt in bed, denies the need for anything at the present time. Billie RN notified of pt arrival. TIM M. [facility] RN

## 2013-12-11 NOTE — Unmapped (Signed)
PSYCHIATRY CONSULT, INITIAL EVALUATION    Alexander [name] [id number]     Date/time of admission: 12/10/2013  2:11 PM    CC/Reason for Consult: safety evaluation    HPI: 28 y.o. male with a PMH of polysubstance abuse and depression who presented to the emergency department with a chief complaint of taking 25 Tylenol PM's, 5 5 mg Percocet, and a twelve pack of beer around 11 PM yesterday (12/09/12) evening. Patient said that he was depressed and wanted to end it all. He had one episode of vomiting after ingestion, but he has not vomited since and there were no pills in the vomit. Patient was initially taken to [facility], but when his Tylenol ingestion was discovered, he was taken to the ED at [facility]. Patient denies any recent illness, SOB, or CP. Patient does endorse some abdominal pain. Patient says that he has had a lot of increased stress recently, and he has been drinking more as he tries to quit heroin. Patient has not used heroine in a few days, drinks a 12 pack a beer daily, and does almost everything else. He has not used any benzos in a long time, and he has not used cocaine in days either. Patient has been depressed all his life. Both of his brothers committed suicide and his father died.     In the ED, patient was started on N-acetylcysteine, given Valium, and a rally pack. Patient was alert and oriented and denied current SI/HI.    Patient was found lying in bed, with sitter at bedside. He endorsed depressed mood for a long time, worsened since his brother committed suicide in 2008. He said he is unable to tell how much depressed he actually feels, because he also uses a lot of drugs which makes his mood change frequently. Over the last month, he felt worse as December is the birthday dates of both his brother who committed suicide. He attempted to overdose on heroin and percocet 3 times in the last 3 weeks, specifically as a way to die. He would wake up in the morning anyway. When asked how he feels right  now, he says he feels stupid and that he is not suicidal anymore. He would like to quit his heroin and alcohol use, and is open to start treatment. He gives permission to call his girlfriend to obtain collateral Linton Rump [place], [phone]).     He describes drinking 12-pack beer daily. He describes mild withdrawal symptoms but denies prior DT's or withdrawal seizures. He uses heroin daily (up to 2 g/d), snorting.     He denies prior h/o manic symptoms. However, he describes short-lived periods (few hours) of increased energy, hope, mood, and drive to work better and do better. He denies that these symptoms have ever lasted more than 4 days, or that they were accompanied by elated mood, irritable mood, decreased need for sleep. Has been sober for 3 months, after 30 days of rehab and being on lithium, clonazepam, and citalopram.    Collateral information from girlfriend Linton Rump Terrilee Croak, [phone]): patient's GF was not aware of patient's overdose, although she was aware that patient has called 911 to come to [facility] because feeling suicidal. Per GF, patient has not been voicing SI (just a little) recently. She is not concerned whether he will kill himself (as SA) but thinks he may kill himself unintentionally by drinking too much or overdosing. She does not feel he is a threat and will accept him back home if he is discharged.  She feels hopeless about him because he will not enter treatment, will keep drinking and using drugs, and will not improve. He was admitted to psych hospital and relapsed the same day he was discharged.     context: suicidal attempt and opioid and alcohol withdrawal  severity: severe  location: mood disturbance  associated symptoms: suicidal ideation, hopelessness, helplessness  modifiers: non-adherence to meds  duration: acute    Past Psychiatric History:    Prior hospitalizations: multiple, for depressed mood and suicidal ideation. Last one on 11/14/2013 for 3 days at [facility], when he was also  requesting help with mental health and substance abuse tx, but when he was offered residential rehab but refused   Prior diagnoses: depression, bipolar disorder, mood disorder, opiate use disorder, alcohol use disorder   Prior medication trials: depakote, lithium, celexa, klonopin, seroquel, effexor, zoloft, prozac   Outpatient Treatment: not connected to any at the moment. One year ago was in [place] where he had outpatient tx   Suicide Attempts: multiple (overdose), tried to hang himself once      Substance Use History:   Nicotine: 2 ppd   Alcohol: 12 beers per day   Illicits: heroin, opiates (pills) past use of crack cocaine and MJ     Past Medical History:   Past Medical History   Diagnosis Date   ??? Depression    ??? Drug abuse and dependence    ??? H/O tooth extraction    ??? Thyroid disease    ??? Substance induced mood disorder    ??? PTSD (post-traumatic stress disorder)    ??? Anxiety    ??? Bipolar disorder    ??? Bronchitis    ??? Liver disease      Patient unaware of actual dx     Past Surgical History   Procedure Laterality Date   ??? Mandible surgery         Social/Developmental History:   Living with his older girlfriend and her three daughters in [place]. No children of his own.   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 Drug abuse Brother    ??? Hypertension Mother    ??? Transient ischemic attack Mother    ??? Seizures Father    ??? Hypertension Father    ??? Cancer Father       Medical: see above   Psychiatric: bipolar disorder   History of completed suicide: 2 brothers    Allergies:  No Known Allergies    Home Medications:   No current facility-administered medications on file prior to encounter.     Current Outpatient Prescriptions on File Prior to  Encounter   Medication Sig Dispense Refill   ??? citalopram (CELEXA) 20 MG tablet Take 1 tablet (20 mg total) by mouth daily.  30 tablet  0   ??? ibuprofen (ADVIL,MOTRIN) 600 MG tablet Take 1 tablet (600 mg total) by mouth every 6 hours as needed for Pain.  30 tablet  0   ??? lithium carbonate 600 MG capsule Take 600 mg by mouth 2 times a day with meals.           Current Medications ordered:  Scheduled Meds:  ??? acetylcysteine (ACETADOTE) *third dose* infusion  100 mg/kg Intravenous Once   ??? folic acid  1 mg Oral Daily 0900    And   ??? multivitamin  1 tablet Oral Daily 0900    And   ??? thiamine  100 mg Oral Daily 0900   ??? heparin (porcine)  5,000 Units Subcutaneous Q8H     Continuous Infusions:  ??? sodium chloride 125 mL/hr (12/10/13 2126)     PRN Meds:.ibuprofen, nicotine (polacrilex), ondansetron, ondansetron    ROS: Denies trouble with fever, rash, headache, vision changes, chest pain, shortness of breath, nausea, extremity pain, weakness, dysuria.    OBJECTIVE:  Vitals:  .  Filed Vitals:    12/11/13 0600 12/11/13 0800 12/11/13 1000 12/11/13 1200   BP: 152/82 161/98 159/99 160/89   Pulse: 75 75 67 68   Temp: 97.6 ??F (36.4 ??C) 97.9 ??F (36.6 ??C)  98.1 ??F (36.7 ??C)   TempSrc: Oral Oral  Oral   Resp: 17 19 18 16    Height:       Weight:       SpO2: 100% 100% 100% 100%       Physical Exam:   Not performed at this encounter    Mental Status Exam:   Appearance: appears stated age, fair eye contact  Behavior/Movement/Muscle Tone: no PMR/PMA, no abnormal movements appreciated,   Speech: tone, prosody, volume, rate, production wnl, fluent nonpressure  Mood/Affect: better, full range, mood congruent, content appropriate  Thought Process: goal directed, linear, no FOI, no LOA  Thought Content: Denies SI/HI, no delusions expressed, no AH/VH and not RTIS  Orientation: alert, oriented to person place and situation  [employer]: appears consistent with average intellect  Memory: grossly intact to recent and remote content  discussed  Insight/Judgment: limited/limited    Labs:   Complete Blood Count:  Recent Labs      12/10/13   1409  12/10/13   2059  12/11/13   0325   WBC  8.7  9.1  6.3   HGB  15.5  14.2  15.1   HCT  45.0  41.0  44.2   MCV  88.3  88.5  88.9   PLT  212  192  174       Basic Metabolic Panel:  Recent Labs      12/10/13   1310  12/10/13   1409  12/11/13   0325   NA  146  141  142   K  4.1  3.9  3.8   CL  105  107  109   CO2  27  25  24    BUN  11  11  10    MG   --   1.9  1.8   PHOS  2.2*   --    --        Hepatic Panel:  Recent Labs      12/10/13   1310  12/10/13   1409  12/11/13   1138   AST   --   158*  184*   ALT   --   299*  285*   ALBUMIN  4.9  4.7  4.1          Imaging:     CT scan head (08/30/2013):  Findings:   There is a curvilinear area of ill-defined high attenuation in the left caudate head (series 2 image 16), not definitely seen on prior study and likely an artifact. Otherwise, the remainder  of the brain parenchyma is normal in attenuation. There is no areas of suspected intracranial hemorrhage or mass effect. The ventricles, sulci, and cisterns are normal in size and configuration. No extra-axial fluid is seen. There is moderate to severe mucosal thickening of the left maxillary sinus with internal high attenuation, progressed since prior study. Evaluation of the face will be reported separately. No abnormalities of the calvarium are identified.   Impression:   No evidence of acute intracranial hemorrhage or mass effect.      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 male admitted to Medicine service on 12/10/2013 for overdose on Tylenol and Percocet as a suicide attempt.    1. Mood disorder NOS: patient with strong FH for bipolar disorder,  and some symptoms compatible with BD type II. Has h/o prior adequate treatment when on mood stabilizers. We would recommend:  - start lamotrigine 25 mg qday PO for 14 days, with further titration to 25 mg BID PO for additional 14 days. After that, titrate up to 50 mg BID PO for one week, and then up to 100 mg PO BID.   - please keep sitter    2. Alcohol use disorder/alcohol withdrawal:  - rally pack  - CIWA protocol and lorazepam per protocol    3. Opioid use disorder/opioid withdrawal:  - treat symptomatically    2. Agitation recs: Patient is at risk for agitation, in the event of which we would recommend considering the following:   -recommend 1:1 observation: Yes   -prn med options: Ativan 2 mg PO or IM, q8hrs, prn; Haldol 5-10 mg IM prn q8hs    3. Delirium: Patient is at risk for delirium.   -Nonpharmacologic evidence-based delirium precautions include the following: frequently reorient, shades up during day/down at night, TV off except for soft music only, have familiar objects at bedside, encourage friends/family to spend the night, minimize anticholingeric medications, minimize polypharmacy, minimize restraints (includes lines and/or foleys and/or feeding tubes as able) and minimize overnight checks/vitals to encourage restful consistent sleep patterns    4. Substance Abuse/Dep/Withdrawal: see above    5. Safety: may require admission to inpatient psychiatry once medically stable (no O2 requirement, no lines or drains, no IV medications, vitals stable for minimum of 24hrs, appropriate mobility established either by baseline or PT/OT evals, and outpatient medical followup plan in place)    Risk Assessment: The patient has the following risk factors for violence to self/others: age, male, prior suicide attempts/self-harm behaviors, mood disorder, impulse control disorder, active/ongoing substance use, family history of completed suicide, decreased social support.  Protective factors include: some positive  social support, future orientation.  Overall the risk is for future dangerousness.  However the patient's imminent risk is high as the patient is now: just recently sober, future oriented.    Thank you for this consult, please call us with any further questions. We will continue to follow at this time.    Rolla Plate RESIDENT  Psych Consult Pager: [phone]  Patient seen and plan discussed and agreed upon with attending Dr. Lewanda Rife

## 2013-12-11 NOTE — Unmapped (Signed)
Department of Internal Medicine  Daily Progress Note      Chief Complaint / Reason for Follow-Up     Alexander [name] is a 28 y.o. male on hospital day 1.  The principal reason for today's follow up visit is Tylenol overdose.       Interval History / Subjective     Patient did not have any events overnight. This morning patient's abdominal pain improved. Patient continues to deny SI. Patient tolerating the Acetadote well.     Review of Systems (Focused)     See above      Medications     Scheduled Meds:  ??? acetylcysteine (ACETADOTE) *third dose* infusion  100 mg/kg Intravenous Once   ??? folic acid  1 mg Oral Daily 0900    And   ??? multivitamin  1 tablet Oral Daily 0900    And   ??? thiamine  100 mg Oral Daily 0900   ??? heparin (porcine)  5,000 Units Subcutaneous Q8H     Continuous Infusions:  ??? sodium chloride 125 mL/hr (12/10/13 2126)     PRN Meds:  ibuprofen, nicotine (polacrilex), ondansetron, ondansetron       Vital Signs     Temp:  [97.6 ??F (36.4 ??C)-98.4 ??F (36.9 ??C)] 98.1 ??F (36.7 ??C)  Heart Rate:  [55-110] 68  Resp:  [12-21] 16  BP: (107-161)/(73-99) 160/89 mmHg    Intake/Output Summary (Last 24 hours) at 12/11/13 1347  Last data filed at 12/11/13 1247   Gross per 24 hour   Intake 1479.5 ml   Output   3170 ml   Net -1690.5 ml         Physical Exam     GEN: young WM laying in bed appearing nervous, with tremors, otherwise in NAD  CARDIO: RRR. S1+S2. No m/g/r.   VASC: Radials 2+. PTs 2+. No JVD. No carotid Bruit. No peripheral edema.   PULM: CTAB. No wheezes/rhonchi/rales. Symmetric expansion. No accessory muscle use noted.   NEURO: No focal deficits appreciated. Alert and oriented X3.   HEENT: NCAT. PERRL. No conjunctival injection/scleral icterus. Gross auditory acuity.   ABD: Soft. NTTP. No guarding or rebound. BS present X4. No organomegaly appreciated.   MSK: Full ROM of BUE and BLE otherwise grossly normal.   EXTREM: Atraumatic. No cyanosis/clubbing of nails noted.   SKIN: No redness/rashes. Multiple  tatoos.  Psych: flat affect, pleasant, appropriate      Laboratory Data         Lab 12/11/13  0325 12/10/13  2059 12/10/13  1409 12/10/13  1310   WBC 6.3 9.1 8.7 9.0   HEMOGLOBIN 15.1 14.2 15.5 15.9   HEMATOCRIT 44.2 41.0 45.0 46.0   MEAN CORPUSCULAR VOLUME 88.9 88.5 88.3 89.6   PLATELETS 174 192 212 227           Lab 12/11/13  0325 12/10/13  1409 12/10/13  1310 12/06/13  1500   SODIUM 142 141 146 149*   POTASSIUM 3.8 3.9 4.1 4.4   CHLORIDE 109 107 105 105   CO2 24 25 27 26    BUN 10 11 11 9    CREATININE 0.95 0.93 0.87 0.92   GLUCOSE 101* 97 74 91           Lab 12/11/13  0325 12/10/13  1409 12/10/13  1310 12/06/13  1500   CALCIUM 8.4* 9.1 9.3 9.4   MAGNESIUM 1.8 1.9  --   --    PHOSPHORUS  --   --  2.2*  --            Lab 12/11/13  1138 12/10/13  1409   INR 1.1 1.0   PROTHROMBIN TIME 13.7 13.4           Lab 12/11/13  1138 12/10/13  1409 12/10/13  1310 12/06/13  1500   ALT 285* 299*  --  398*   AST 184* 158*  --  195*   ALK PHOS 86 99  --  127*   BILIRUBIN TOTAL 0.7 0.4  --  0.5   BILIRUBIN DIRECT 0.0 0.0  --  0.0   ALBUMIN 4.1 4.7 4.9 4.8     Results for Alexander [name], Alexander [name] (MRN [id number]) as of 12/11/2013 13:18   Ref. Range 01/13/2012 19:14 11/14/2013 13:45   Alkaline Phosphatase Latest Range: 40-115 U/L 107 90   SGOT (AST) Latest Range: 14-65 U/L 40 233 (H)   ALT (SGPT), Serum Latest Range: 13-69 U/L 54 390 (H)   Albumin Latest Range: 3.6-5.1 g/dL 5.8 (H) 4.1   Protein, Total, Serum Latest Range: 6.2-8.3 g/dL 9.7 (H) 6.8   Bili, Total, Serum Latest Range: 0.2-1.2 mg/dL 0.6 0.4   Bili, Direct, Serum Latest Range: 0.0-0.2 mg/dL 0.0 0.0   Amylase Latest Range: 21-101 U/L 49    Lipase Latest Range: 23-300 U/L 98          Lab 12/10/13  1310 12/06/13  1451   COLOR, URINE Straw Yellow   CLARITY Clear Clear   PROTEIN UA Negative Negative   PH UA 6.0 7.0   SPECIFIC GRAVITY, URINE 1.011 1.015   GLUCOSE UA Negative Negative   BLOOD UA Negative Negative   LEUKOCYTES UA Negative Negative   NITRITE UA Negative Negative   BILIRUBIN UA  Negative Negative   UROBILINOGEN UA <2.0 0.2 E.U./dL             No results found for this basename: [id number]       Lab Results   Component Value Date    TSH 1.13 12/10/2013    FREET4 1.08 11/14/2013                 Diagnostic Studies           Assessment & Plan     Alexander [name] is a 29 y.o. male on HD# 1 with Tylenol overdose.  The medical issues being addressed in today's encounter are as follows:    Principal Problem:    Tylenol overdose      Tylenol Overdose   -Patient completed full 21 hour course of Acetadote. Labs were rechecked 2 hours prior to the completion as recommended by poison control, and while patient's Tylenol level was undetectable, his LFTs only went down slightly and patient's AST acutally went up. Poison control was contacted again. As patient has underlying Hepatitis C, it is unclear what patient's baseline hepatic function is. Spoke to toxicology at poison control, and they do not think anymore Acetadote is needed, and this is secondary to patient's underlying Hep C.   -continue NS at 125 cc/hr   -Zofran PO or IV for N/V   -q4 neuro checks    Suicide Attempt   -patient not currently actively suicidal   -sitter at bedside   -psychiatry consulted- Thanks!   -will need to go back to [facility] once medically stable     Alcohol Withdrawal   -patient on CIWA and may go into alcohol withdrawal   - Most recent CIWA scores 10 qne  9 (the most recent one being 9 documented at 8 am).   -Patient got 1 dose of Valium 5 mg PO in the ED and got 5mg  this morning. Must be cautious in giving Valium or other benzos as they are hepatically metabolized.   -Patient has had withdrawal with severe tremors in the past, but no seizures, and the patiant has never been intubated secondary to withdrawal.         Nutrition:  Diet Orders         Diet regular starting at 01/06 1945          Code Status: Full Code    Signed:  Suan Halter, MD  12/11/2013, 1:47 PM

## 2013-12-11 NOTE — Unmapped (Signed)
Pt admitted to unit from [facility], handoff received from [place], [place].  Pt oriented x 4 and able to verbalize understanding.    Denies SI. Pleasant/cooperative throughout interaction.      Belongings charted in Epic and pt verbally agreed with list.  Pt given education on plan of care and unit policies including fall precautions, video monitoring, and safety measures.  Pt encouraged to use call light as needed.    Will continue to monitor and will offer nursing interventions as necessary.

## 2013-12-11 NOTE — Unmapped (Signed)
Patients vital signs have been stable. Patient is alert and oriented x 4. Patient complained of headache, PRN ibuprofen was given. Patient tolerated ambulating to the bathroom. Patient has not been able to sleep very much. Patient is currently in bed awake, will continue to monitor.

## 2013-12-12 ENCOUNTER — Inpatient Hospital Stay
Admission: TF | Admit: 2013-12-12 | Discharge: 2013-12-17 | Disposition: A | Discharge status: Home / Self Care | Payer: PRIVATE HEALTH INSURANCE | Source: Intra-hospital | Attending: Psychiatry | Admitting: Psychiatry

## 2013-12-12 DIAGNOSIS — F3130 Bipolar disorder, current episode depressed, mild or moderate severity, unspecified: Secondary | ICD-10-CM

## 2013-12-12 LAB — BASIC METABOLIC PANEL
Anion Gap: 6 mmol/L (ref 3–16)
BUN: 10 mg/dL (ref 7–25)
CO2: 28 mmol/L (ref 21–33)
Calcium: 8.8 mg/dL (ref 8.6–10.2)
Chloride: 104 mmol/L (ref 98–110)
Creatinine: 0.78 mg/dL (ref 0.50–1.30)
GFR MDRD Af Amer: 147 See note.
GFR MDRD Non Af Amer: 121 See note.
Glucose: 90 mg/dL (ref 65–99)
Osmolality, Calculated: 285 mosm/kg (ref 278–305)
Potassium: 3.9 mmol/L (ref 3.5–5.3)
Sodium: 138 mmol/L (ref 135–146)

## 2013-12-12 LAB — HEPATIC FUNCTION PANEL
ALT: 294 U/L (ref 13–69)
AST: 198 U/L (ref 14–65)
Albumin: 3.7 g/dL (ref 3.6–5.1)
Alkaline Phosphatase: 96 U/L (ref 40–115)
Bilirubin, Direct: 0 mg/dL (ref 0.0–0.2)
Total Bilirubin: 0.5 mg/dL (ref 0.2–1.2)
Total Protein: 6.5 g/dL (ref 6.2–8.3)

## 2013-12-12 LAB — HEPATITIS B CORE ANTIBODY: Hep B Core Total Ab: NONREACTIVE

## 2013-12-12 LAB — CBC
Hematocrit: 41.1 % (ref 38.5–50.0)
Hemoglobin: 14.4 g/dL (ref 13.2–17.1)
MCH: 31 pg (ref 27.0–33.0)
MCHC: 34.9 g/dL (ref 32.0–36.0)
MCV: 88.7 fL (ref 80.0–100.0)
MPV: 10.2 fL (ref 7.5–11.5)
Platelets: 178 10*3/uL (ref 140–400)
RBC: 4.64 10*6/uL (ref 4.20–5.80)
RDW: 13.3 % (ref 11.0–15.0)
WBC: 7.5 10*3/uL (ref 3.8–10.8)

## 2013-12-12 LAB — HEPATITIS C RNA, RNA GENOTYPING ONLY: HCV Genotype 3: DETECTED

## 2013-12-12 LAB — HEPATITIS A TOTAL ANTIBODY: Hep A IgG: NONREACTIVE

## 2013-12-12 LAB — HEPATITIS B SURFACE ANTIGEN: Hep B Surface Ag: NONREACTIVE

## 2013-12-12 LAB — PROTIME-INR
INR: 1 (ref 0.9–1.1)
Protime: 13.2 seconds (ref 11.6–14.4)

## 2013-12-12 LAB — HEPATITIS C RNA, QUANTITATIVE PCR
IU log10: 6.625 log 10 IU/mL
International Units: 4214533 IU/mL

## 2013-12-12 LAB — HEPATITIS B SURFACE ANTIBODY, QUANTITATIVE: Hep B S Ab: REACTIVE

## 2013-12-12 MED ORDER — LORazepam (ATIVAN) tablet 0.5 mg
0.5 | ORAL | Status: AC | PRN
Start: 2013-12-12 — End: 2013-12-14

## 2013-12-12 MED ORDER — LORazepam (ATIVAN) tablet 2 mg
1 | ORAL | Status: AC | PRN
Start: 2013-12-12 — End: 2013-12-14

## 2013-12-12 MED ORDER — thiamine (VITAMIN B-1) tablet 100 mg
100 | Freq: Every day | ORAL | Status: AC
Start: 2013-12-12 — End: 2013-12-15
  Administered 2013-12-13 – 2013-12-15 (×3): 100 mg via ORAL

## 2013-12-12 MED ORDER — folic acid (FOLVITE) tablet 1 mg
1 | Freq: Every day | ORAL | Status: AC
Start: 2013-12-12 — End: 2013-12-15
  Administered 2013-12-13 – 2013-12-15 (×3): 1 mg via ORAL

## 2013-12-12 MED ORDER — sodium chloride 0.9 % flush 10 mL
INTRAMUSCULAR | Status: AC
Start: 2013-12-12 — End: 2013-12-12

## 2013-12-12 MED ORDER — multivitamin (THERAGRAN) tablet 1 tablet
Freq: Every day | ORAL | Status: AC
Start: 2013-12-12 — End: 2013-12-17
  Administered 2013-12-13 – 2013-12-17 (×5): 1 via ORAL

## 2013-12-12 MED ORDER — nicotine (polacrilex) (NICORETTE/NICORELIEF) gum 2 mg
2 | BUCCAL | Status: AC | PRN
Start: 2013-12-12 — End: 2013-12-17
  Administered 2013-12-12 – 2013-12-17 (×22): 2 mg via ORAL

## 2013-12-12 MED ORDER — lamoTRIgine (LAMICTAL) tablet 25 mg
25 | Freq: Every day | ORAL | Status: AC
Start: 2013-12-12 — End: 2013-12-17
  Administered 2013-12-13 – 2013-12-17 (×5): 25 mg via ORAL

## 2013-12-12 MED ORDER — cloNIDine HCl (CATAPRES) tablet 0.1 mg
0.1 | Freq: Three times a day (TID) | ORAL | Status: AC | PRN
Start: 2013-12-12 — End: 2013-12-17
  Administered 2013-12-14 – 2013-12-15 (×2): 0.1 mg via ORAL

## 2013-12-12 MED ORDER — risperiDONE M-TAB (RISPERDAL M-TABS) disintegrating tablet 2 mg
2 | Freq: Two times a day (BID) | ORAL | Status: AC | PRN
Start: 2013-12-12 — End: 2013-12-17
  Administered 2013-12-14 – 2013-12-17 (×4): 2 mg via ORAL

## 2013-12-12 MED ORDER — loperamide (IMODIUM) capsule 2 mg
2 | ORAL | Status: AC | PRN
Start: 2013-12-12 — End: 2013-12-17

## 2013-12-12 MED ORDER — ibuprofen (ADVIL,MOTRIN) tablet 400 mg
400 | Freq: Four times a day (QID) | ORAL | Status: AC | PRN
Start: 2013-12-12 — End: 2013-12-14
  Administered 2013-12-12 – 2013-12-14 (×3): 400 mg via ORAL

## 2013-12-12 MED ORDER — risperiDONE M-TAB (RISPERDAL M-TABS) disintegrating tablet 1 mg
1 | Freq: Four times a day (QID) | ORAL | Status: AC | PRN
Start: 2013-12-12 — End: 2013-12-17

## 2013-12-12 MED ORDER — LORazepam (ATIVAN) tablet 1 mg
1 | ORAL | Status: AC | PRN
Start: 2013-12-12 — End: 2013-12-14

## 2013-12-12 MED ORDER — LORazepamATIVANtablet05mg
0.5 | Freq: Three times a day (TID) | ORAL | Status: AC
Start: 2013-12-12 — End: 2013-12-17
  Administered 2013-12-13 – 2013-12-17 (×14): 0.5 mg via ORAL

## 2013-12-12 MED ORDER — hydrOXYzine pamoate (VISTARIL) capsule 25 mg
25 | Freq: Every evening | ORAL | Status: AC | PRN
Start: 2013-12-12 — End: 2013-12-17
  Administered 2013-12-13 – 2013-12-14 (×2): 25 mg via ORAL

## 2013-12-12 MED FILL — LORAZEPAM 0.5 MG TABLET: 0.5 0.5 MG | ORAL | Qty: 1

## 2013-12-12 MED FILL — DIAZEPAM 2 MG TABLET: 2 2 MG | ORAL | Qty: 1

## 2013-12-12 MED FILL — FOLIC ACID 1 MG TABLET: 1 1 MG | ORAL | Qty: 1

## 2013-12-12 MED FILL — THIAMINE HCL (VITAMIN B1) 100 MG TABLET: 100 100 MG | ORAL | Qty: 1

## 2013-12-12 MED FILL — IBUPROFEN 400 MG TABLET: 400 400 MG | ORAL | Qty: 1

## 2013-12-12 MED FILL — NICORELIEF 2 MG GUM: 2 2 mg | BUCCAL | Qty: 1

## 2013-12-12 MED FILL — HEPARIN (PORCINE) 5,000 UNIT/ML INJECTION SOLUTION: 5000 5,000 unit/mL | INTRAMUSCULAR | Qty: 1

## 2013-12-12 NOTE — Unmapped (Signed)
[facility]  Department of Internal Medicine  Inpatient Discharge Summary    Patient: Alexander [name]   MRN: [id number]   CSN: [id number]    Date of Admission: 12/10/2013  Date of Discharge: 12/12/2013     Attending Physician: Kandee Keen, MD      Diagnoses Present on Admission     Past Medical History   Diagnosis Date   ??? Depression    ??? Drug abuse and dependence    ??? H/O tooth extraction    ??? Thyroid disease    ??? Substance induced mood disorder    ??? PTSD (post-traumatic stress disorder)    ??? Anxiety    ??? Bipolar disorder    ??? Bronchitis    ??? Liver disease      Patient unaware of actual dx          Discharge Diagnoses     Active Hospital Problems    Diagnosis Date Noted   ??? Tylenol overdose [965.4, E980.0] 12/10/2013   ??? Bipolar disorder [296.80] 12/11/2013   ??? Alcohol abuse [305.00] 12/11/2013   ??? Polysubstance abuse [305.90] 12/11/2013      Resolved Hospital Problems    Diagnosis Date Noted Date Resolved   No resolved problems to display.         Operations/Procedures/Imaging Studies Performed (include dates)     PA and lateral views of the chest -- 12/06/2013 2:19 PM.  Findings: The cardiomediastinal silhouette is within normal limits. The lungs are clear. There is no evidence of pneumothorax or pleural effusion.   IMPRESSION: No acute cardiopulmonary disease.       Consulting Services (include reason)     1. Psychiatry for suicidal ideation and attempt      Allergies      NKDA      Discharge Medications     Current Discharge Medication List      STOP taking these medications       citalopram (CELEXA) 20 MG tablet Comments:   Reason for Stopping:         ibuprofen (ADVIL,MOTRIN) 600 MG tablet Comments:   Reason for Stopping:         lithium carbonate 600 MG capsule Comments:   Reason for Stopping:                 Reason for Admission     Alexander [name] is a 28 y.o. male with PMH of polysubstance abuse, positive Hepatitis C antibody, and depression who presented to the emergency department  (12/10/13) with a chief complaint of overdose. Reported taking Tylenol PM x25 tabs, 5 mg Percocet x5 tabs, and a twelve pack of beer around 11 PM on the day prior to admission (12/09/12). Came to EMS when he woke up because he didn't want to die. Reports multiple attmpest at ingesstion for suicidal ideation but they didn't work.    Patient said that he was depressed and wanted to end it all. He had one episode of vomiting after ingestion, but there were no pills in the vomit. Patient was initially taken to [facility], but when his Tylenol ingestion was discovered, he was taken to the ED at [facility]. Patient denied any recent illness, SOB, or CP. Patient admitted to increased stress recently, and he has been drinking more as he tried to quit heroin. Patient has not used heroine in a few days, drinks a 12 pack a beer daily, and does almost everything else. He has not used any  benzos in a long time, and he has not used cocaine in days either. Patient has been depressed all his life, Both of his brothers committed suicide and his father suffered from depression.    Hospital Course     Tylenol Overdose. Ingestion presumably occurred on 1/5. Presented to [facility] 1/6 at 1 pm. Acetaminophen level 23 on 1/6 at 1400 and <10 on 1/7. Has has stably elevated transaminitis which likely is due to Hep C. Liver synthetic function intact (INR 1.0, Alb 3.7)    Alcohol withdrawal. Long history of drinking, started at age 28. Doing well on scheduled Vallium. Was getting 5 mg TID. Tapered down to 5 mg BID for 3 days, then once daily for 3 days, then stop. If he gets discharged from [facility] sooner than 6 days then can stop (has an active metabolite that will be in his system for a bit acting essentially creating a natural taper)    Suicidal ideation, major depressive episode. Has strong family history of bipolar disease with both brother committing suicide.     Positive Hepatitis C antibody. Viral load and genotype pending. To follow up with PCP.  Hepatology referral and liver ultrasound as an outpt when he has followed up with PCP.     A single episode of sinus tachycardia to 140s. I personally reviewed his tele strips. Was up in bathroom. HR down to 70s when back in bed. Completely asymptomatic. Has good PO intake and has been ambulating comfortably in room. Given his age, recent stress, and that he is asymptomatic without any other medical comorbidities I feel that no further testing needed unless he complains of palpitations, chest pain,  SOB, etc.    Condition on Discharge     1. Functional Status: normal    2. Mental Status: Alert/Oriented    3. Diet / Tube Feeding / TPN: Regular Diet    4. Respiratory / Lines & Tubes / Wounds: None required    5. Discharge Physical Exam:  BP 140/94   Pulse 79   Temp(Src) 98.5 ??F (36.9 ??C) (Oral)   Resp 18   Ht 5' 9 (1.753 m)   Wt 160 lb (72.576 kg)   BMI 23.62 kg/m2   SpO2 100%   GEN: WM sitting up in bed in NAD, no tremors, appears stated age  CARDIO: RR. S1 and S2 nml. No m/g/r.   VASC: Radials 2+. PTs 2+. No JVD. No carotid Bruit. No peripheral edema.   PULM: CTAB. No wheezes/rhonchi/rales. Symmetric expansion. No accessory muscle use noted.   NEURO: No focal deficits appreciated. Alert and oriented X3.   HEENT: NCAT. PERRL. No conjunctival injection/scleral icterus. Gross auditory acuity.   ABD: Soft. NTTP. No guarding or rebound. BS present X4. No organomegaly appreciated.   MSK: Full ROM of BUE and BLE otherwise grossly normal.   EXTREM: Atraumatic. No cyanosis/clubbing of nails noted.   SKIN: No redness/rashes. Multiple tatoos.   Psych: flat affect, pleasant, appropriate    Core Measure Documentation (As Applicable)     Most Recent Wt: Weight: 160 lb (72.576 kg)         Core Measure Documentation  Was the Pneumonia Vaccine Screening Completed?: Yes  Was the pneumococcal vaccine ordered?: Yes  Was the ordered pneumonia vaccine documented as given?: Yes  Was the Influenza Vaccine Screening Completed?: Yes  Was  the influenza vaccine ordered?: Yes  Was the ordered influenza vaccine documented as given?: Yes  The core measures checklist is complete for discharge?: Yes  Disposition     [facility] for inpatient psychiatric treatment      Follow-Up Appointments     Future Appointments  Date Time Provider Department Center   01/15/2014 1:20 PM Larwance Rote, MD [facility]         Follow-Up Items for Receiving Physician     1. Vallium taper. Have written for 3 more days of 5 mg BID, can stop or go down to once daily then stop.    2. Needs to follow up with a PCP as an outpatient    I have spent 45 minutes coordinating the discharge for Alexander [name].    Alexander Fussner, MD  Attending Physician  Department of Internal Medicine  Pager ID [phone])  12/12/2013, 3:47 PM

## 2013-12-12 NOTE — Unmapped (Signed)
Please refer to note by Dr. Munoz fromI01/07/2014 have reviewed this note and agree with assessment and plan, with modifications noted below. I have reviewed relevant laboratory and diagnostic information. I have examined the patient with findings outlined below. I have participated in key portions of decision making.

## 2013-12-12 NOTE — Unmapped (Signed)
Focus: cardiovascular  Data: Patient's heart rate 141 on the monitor at 1359. Patient reported he was up to the bathroom and washing his hands. Patient currently in bed. HR 74. Patient reports no palpitations, CP, or SOB when he was up to the bathroom.  Action: Dr. Karie Mainland informed.  Response: No new orders at this time.  Rene Kocher RN

## 2013-12-12 NOTE — Unmapped (Signed)
PSYCHIATRY CONSULT, FOLLOW-UP PROGRESS NOTE    Alexander [name] [id number]     Date/time of admission: 12/10/2013  2:11 PM Length of Stay: 2    Brief Summary:  Alexander [name] is a 28 y.o. male with a PMH of polysubstance abuse and depression who presented to the emergency department with an intentional overdose of taking 25 Tylenol PM's, 5 5 mg Percocet, and a twelve pack of beer around 11 PM on the evening of 12/09/12.    Subjective:   This morning he reiterated that he intentionally overdosed because of the recent suicides of his brothers and the death of his father. Moreover, he is finding it difficult to cope with his diagnosis of HepC. He is motivated to live for his mother, who apparently lives with his sister in [place]. He is not suicidal now and contracted for safety while in the hospital. He recognizes that he has a problem with drugs and was previously trying to wean himself of them; however, he discovered that the withdrawal was further affecting his mood. He is willing to pursue an inpatient psychiatric admission for further psychiatric stabilization.     Behavior issues in last 24hours: none  Reviewed staff/RN notes.     PMH/SH/FH reviewed and no changes    Current Medications ordered:  ??? heparin (porcine)  5,000 Units Subcutaneous Q8H   ??? lamoTRIgine  25 mg Oral Daily 0900   ??? thiamine  100 mg Oral Daily 0900     diazepam, ibuprofen, LORazepam, LORazepam, LORazepam, nicotine (polacrilex), ondansetron, ondansetron    OBJECTIVE:  ROS: Denies trouble with fever, rash, headache, vision changes, chest pain, shortness of breath, nausea, extremity pain, weakness, dysuria.    Vitals:  Filed Vitals:    12/11/13 1948 12/12/13 0100 12/12/13 0830 12/12/13 1237   BP: 141/69 131/85 140/94    Pulse: 70 82 72 79   Temp: 98 ??F (36.7 ??C) 97.1 ??F (36.2 ??C) 98.2 ??F (36.8 ??C) 98.5 ??F (36.9 ??C)   TempSrc: Oral Oral Oral Oral   Resp: 16 16 17 18    Height:       Weight:       SpO2: 99% 99% 100% 100%     Mental Status  Exam:   Appearance/Behavior: hair disheveled, intermittent eye contact, becomes flushed as he is speaking   Orientation: alert, oriented to person, place, purpose  Speech: regular rate/rhythm/prosody/volume/production, nonpressured  Mood/Affect: ok, dysthymic mood   Thought Process: linear and logical, goal-oriented, no Looseness of association   Thought Content: denies current SI/HI  Perceptions: denies current auditory and visual hallucinations  Memory: recent and remote intact   Attention span: fair  Fund of Knowledge: average  Insight/Judgement: fair/poor    Labs:  Complete Blood Count:  Recent Labs      12/10/13   2059  12/11/13   0325  12/12/13   0536   WBC  9.1  6.3  7.5   HGB  14.2  15.1  14.4   HCT  41.0  44.2  41.1   MCV  88.5  88.9  88.7   PLT  192  174  178     Basic Metabolic Panel:  Recent Labs      12/10/13   1310  12/10/13   1409  12/11/13   0325  12/12/13   0536   NA  146  141  142  138   K  4.1  3.9  3.8  3.9   CL  105  107  109  104   CO2  27  25  24  28    BUN  11  11  10  10    MG   --   1.9  1.8   --    PHOS  2.2*   --    --    --      Hepatic Panel:  Recent Labs      12/10/13   1409  12/11/13   1138  12/12/13   0536   AST  158*  184*  198*   ALT  299*  285*  294*   ALBUMIN  4.7  4.1  3.7      Imaging: reviewed    Axis I: 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 male admitted to Medicine service on 12/10/2013 for overdose on Tylenol and Percocet as a suicide attempt.     1. Mood disorder NOS:   - recommend inpatient psychiatric hospitalization   - Continue lamotrigine 25 mg qday PO for 14 days, with further titration to 25 mg BID PO for additional 14 days. After that, titrate up to 50 mg BID PO for one week, and then up to 100 mg PO  BID.   - please keep sitter     2. Alcohol use disorder/alcohol withdrawal:   - rally pack   - CIWA protocol and lorazepam per protocol     3. Opioid use disorder/opioid withdrawal:   - treat symptomatically     2. Agitation recs: Patient is at risk for agitation, in the event of which we would recommend considering the following:   -recommend 1:1 observation: Yes   -prn med options: Ativan 2 mg PO or IM, q8hrs prn for agitation; Haldol 5-10 mg IM prn q8hs for severe agitation     4. Safety: requires admission to inpatient psychiatry once medically stable (no O2 requirement, no lines or drains, no IV medications, vitals stable for minimum of 24hrs, appropriate mobility established either by baseline or PT/OT evals, and outpatient medical followup plan in place)   -Risk Assessment: The patient has the following risk factors for violence to self/others: age, male, prior suicide attempts/self-harm behaviors, mood disorder, impulse control disorder, active/ongoing substance use, family history of completed suicide, decreased social support. -Protective factors include: some positive social support, future orientation.   -Overall the risk is high for future and imminent dangerousness.     Rodena Medin   Pager: #[phone]  Patient seen and plan discussed and agreed upon by attending Dr. Debbe Bales.

## 2013-12-12 NOTE — Unmapped (Signed)
Focus: discharge  Data: Patient discharged to [facility].   Action: IV and monitor removed. Discharge instructions explained and given to patient. Transportation scheduled for 1600.  Report called to Bev, RN at [employer].  Response: Patient verbalized understanding. No bleeding at IV site. Patient in no apparent distress, waiting for transportation.  Rene Kocher RN

## 2013-12-12 NOTE — Unmapped (Addendum)
Problem: Major Mood Disorder  Goal: Mania or depressed mood won???t interfere with daily function  Intervention: Dispense prescribed medication per MD order  2028 Nicorette for nicotine craving. Compliant with evening pill pass Ativan.  Will monitor per protocol  Intervention: Assess patient response to interventions  CIWA 1800=3, 2200=1. Headache consistently 6/10 both CIWA times.  Will monitor per protocol.

## 2013-12-12 NOTE — Unmapped (Signed)
Department of Internal Medicine  Daily Progress Note      Chief Complaint / Reason for Follow-Up     Alexander [name] is a 28 y.o. male on hospital day 2.  The principal reason for today's follow up visit is Tylenol overdose.       Interval History / Subjective     Patient had no problems overnight. His CIWA scores have been consistently 8. Patient medically  Cleared and ready for [facility]!     Review of Systems (Focused)     See above      Medications     Scheduled Meds:  ??? heparin (porcine)  5,000 Units Subcutaneous Q8H   ??? lamoTRIgine  25 mg Oral Daily 0900   ??? thiamine  100 mg Oral Daily 0900     Continuous Infusions:     PRN Meds:  diazepam, ibuprofen, LORazepam, LORazepam, LORazepam, nicotine (polacrilex), ondansetron, ondansetron       Vital Signs     Temp:  [97.1 ??F (36.2 ??C)-98.2 ??F (36.8 ??C)] 98.2 ??F (36.8 ??C)  Heart Rate:  [70-93] 72  Resp:  [16-21] 17  BP: (131-143)/(69-101) 140/94 mmHg    Intake/Output Summary (Last 24 hours) at 12/12/13 1221  Last data filed at 12/12/13 0913   Gross per 24 hour   Intake 3745.42 ml   Output    520 ml   Net 3225.42 ml         Physical Exam     GEN: young WM sitting up in bed in NAD, no tremors  CARDIO: RRR. S1+S2. No m/g/r.   VASC: Radials 2+. PTs 2+. No JVD. No carotid Bruit. No peripheral edema.   PULM: CTAB. No wheezes/rhonchi/rales. Symmetric expansion. No accessory muscle use noted.   NEURO: No focal deficits appreciated. Alert and oriented X3.   HEENT: NCAT. PERRL. No conjunctival injection/scleral icterus. Gross auditory acuity.   ABD: Soft. NTTP. No guarding or rebound. BS present X4. No organomegaly appreciated.   MSK: Full ROM of BUE and BLE otherwise grossly normal.   EXTREM: Atraumatic. No cyanosis/clubbing of nails noted.   SKIN: No redness/rashes. Multiple tatoos.  Psych: flat affect, pleasant, appropriate      Laboratory Data         Lab 12/12/13  0536 12/11/13  0325 12/10/13  2059 12/10/13  1409   WBC 7.5 6.3 9.1 8.7   HEMOGLOBIN 14.4 15.1 14.2 15.5    HEMATOCRIT 41.1 44.2 41.0 45.0   MEAN CORPUSCULAR VOLUME 88.7 88.9 88.5 88.3   PLATELETS 178 174 192 212           Lab 12/12/13  0536 12/11/13  0325 12/10/13  1409 12/10/13  1310   SODIUM 138 142 141 146   POTASSIUM 3.9 3.8 3.9 4.1   CHLORIDE 104 109 107 105   CO2 28 24 25 27    BUN 10 10 11 11    CREATININE 0.78 0.95 0.93 0.87   GLUCOSE 90 101* 97 74           Lab 12/12/13  0536 12/11/13  0325 12/10/13  1409 12/10/13  1310   CALCIUM 8.8 8.4* 9.1 9.3   MAGNESIUM  --  1.8 1.9  --    PHOSPHORUS  --   --   --  2.2*           Lab 12/12/13  0536 12/11/13  1138 12/10/13  1409   INR 1.0 1.1 1.0   PROTHROMBIN TIME 13.2 13.7 13.4  Lab 12/12/13  0536 12/11/13  1138 12/10/13  1409 12/10/13  1310 12/06/13  1500   ALT 294* 285* 299*  --  398*   AST 198* 184* 158*  --  195*   ALK PHOS 96 86 99  --  127*   BILIRUBIN TOTAL 0.5 0.7 0.4  --  0.5   BILIRUBIN DIRECT 0.0 0.0 0.0  --  0.0   ALBUMIN 3.7 4.1 4.7 4.9 4.8     Results for Alexander [name], Alexander [name] (MRN [id number]) as of 12/11/2013 13:18   Ref. Range 01/13/2012 19:14 11/14/2013 13:45   Alkaline Phosphatase Latest Range: 40-115 U/L 107 90   SGOT (AST) Latest Range: 14-65 U/L 40 233 (H)   ALT (SGPT), Serum Latest Range: 13-69 U/L 54 390 (H)   Albumin Latest Range: 3.6-5.1 g/dL 5.8 (H) 4.1   Protein, Total, Serum Latest Range: 6.2-8.3 g/dL 9.7 (H) 6.8   Bili, Total, Serum Latest Range: 0.2-1.2 mg/dL 0.6 0.4   Bili, Direct, Serum Latest Range: 0.0-0.2 mg/dL 0.0 0.0   Amylase Latest Range: 21-101 U/L 49    Lipase Latest Range: 23-300 U/L 98          Lab 12/10/13  1310 12/06/13  1451   COLOR, URINE Straw Yellow   CLARITY Clear Clear   PROTEIN UA Negative Negative   PH UA 6.0 7.0   SPECIFIC GRAVITY, URINE 1.011 1.015   GLUCOSE UA Negative Negative   BLOOD UA Negative Negative   LEUKOCYTES UA Negative Negative   NITRITE UA Negative Negative   BILIRUBIN UA Negative Negative   UROBILINOGEN UA <2.0 0.2 E.U./dL             No results found for this basename: [id number]       Lab Results    Component Value Date    TSH 1.13 12/10/2013    FREET4 1.08 11/14/2013                 Diagnostic Studies           Assessment & Plan     Alexander [name] is a 28 y.o. male on HD# 2 with Tylenol overdose.  The medical issues being addressed in today's encounter are as follows:    Principal Problem:    Tylenol overdose  Active Problems:    Bipolar disorder    Alcohol abuse    Polysubstance abuse      Tylenol Overdose   -Patient completed full 21 hour course of Acetadote. Labs were rechecked 2 hours prior to the completion as recommended by poison control, and while patient's Tylenol level was undetectable, his LFTs only went down slightly and patient's AST acutally went up. Poison control was contacted again. As patient has underlying Hepatitis C, this is likely a chronic problem, as his LFT's were actually higher a month prior. Spoke to toxicology at poison control, and they do not think anymore Acetadote is needed, and this is secondary to patient's underlying Hep C.  -this morning patient's LFT's are slightly higher then yesterday and about the same as they were a month ago. Liver was curbsided and suggested to get additional labs including Hep C RNA quant and genotype, Hep B surface ab and ag and core ab. Per liver team, patient does not need liver f/u, but only a f/u with a PCP. Patient will need a liver US as an outpatient.  -NS discontinued since patient eating well  -Zofran PO or IV for N/V   -q4 neuro checks  Suicide Attempt   -patient not currently actively suicidal   -sitter at bedside   -psychiatry consulted and recommended to add Lamictal. Thanks!  -ready for [facility]    Alcohol Withdrawal   -patient's CIWA consistently 8 overnight  -patient did not get any addittional Ativan overnight  -continue Valium 5 mg q8 to be tapered at [facility]  -Patient has had withdrawal with severe tremors in the past, but no seizures, and the patiant has never been intubated secondary to withdrawal.          Nutrition:  Diet Orders         Diet regular safety tray starting at 01/08 0747          Code Status: Full Code    Signed:  Suan Halter, MD  12/12/2013, 12:21 PM

## 2013-12-12 NOTE — Unmapped (Signed)
Primary Care appt scheduled for patient for 2/11 at 1:20P.  Patient notified and appt added to AVS.  Aretta Nip RN [phone]

## 2013-12-12 NOTE — Unmapped (Signed)
[facility]  Psychiatric Social Worker Assessment Consult Note and Discharge Summary      Alexander [name]    [id number]    Chief complaint in patient's own words:: pt admitted for OD in suicide attempt    Clinicians's description of presenting problem: Patient is a 28 yo male with a history of PSA and depression who was admitted for OD on Tylenol, Percocet, and ETOH in a suicide attempt.       History:  History of Present Illness: Patient is a 28 yo male with a history of PSA and depression who was admitted for OD on Tylenol, Percocet, and ETOH in a suicide attempt. Patient reports that he has been feeling depressed and wanted to end it all. Patient reports he has attempted 3x in the last 3 weeks. Patient reports stressors to include relapse on heroin and both of his brothers successfully committed suicide and both of their birthdays would have been in December.           Psychiatric History: Patient has been diagnosed with Mood D/O NOS and PSA. Patient reports prior suicide attempts and past psychiatric admissions.       Chemical Dependence History:                                   Patient recently relapsed on heroin; patient is also using opiates, crack cocaine, and MJ. Patient expressed that he would like treatment for his drug use.     Social History, Support System and Current Living Situation: Patient lives with his girlfriend and her 3 children. 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     Collateral Information:  [name], girlfriend, [phone]  Psych resident has already obtained collateral information from patient's gf.     Mental Status Exam:     Appearance and Behavior  Apparent Age: Appears Actual Age  Eye Contact: Fair  Appear/Hygiene: Disheveled  Patient Behaviors: Calm;Cooperative  Level of Alertness: Alert    Motor / Speech  Motor Activity: Other (Comment)  Speech: Logical/coherent    Affect / Thought  Affect: Depressed  Thought Content: Suicidal  Ideation  Thought Processes: Concrete thinking  Intelligence: Average        Risk Factors/Stress Factors:  Stress Factors  Patient Stress Factors: Financial concerns;Strained family relationships;Resistant to accept help;Other (Comment) (substance abuse)  Risk Factors  Assault Risk Assessment: No risk factors present  Self Harm/Suicidal Ideation Plan: pt admitted for OD in suicide attempt  Previous Self Harm/Suicidal Plans: pt has had prior suicide attempts  Family Suicide History: Unknown  Current Plans of  Homicide or to Harm Another : pt denies  Previous Plans of Homicide or to Harm Another: pt denies  Access to Lethal Means : unknown      Formulation and Plan:   Chart reviewed, care plan completed, and met with patient at bedside along with psych consult team. Patient is a 28 yo male with a history of PSA and depression who was admitted for OD on Tylenol, Percocet, and ETOH in a suicide attempt. Patient reports that he has been feeling depressed and wanted to end it all. Patient reports he has attempted 3x in the last 3 weeks. Patient reports stressors to include relapse on heroin and both of his brothers successfully committed suicide and both of their birthdays would have been in December. Dr. Debbe Bales recommended [facility], however criteria  for admission is private insurance and/or Medicare and patient has [place]. This was confirmed with intake staff at [facility]. Patient has been medically cleared and will be transferred to inpatient psychiatry at [phone]) for safety and stabilization. Patient will be transported by [facility] via stretcher at [facility] today. Patient, RN, MD, and receiving facility are aware of plan. No other SW needs at this time.         Vallarie Mare, MSW, [place]  [phone]

## 2013-12-12 NOTE — Unmapped (Signed)
1630 27yo White M admitted to 42 5W from Main campus. Belongings searched and inventoried. CFS. Oriented to room, unit, procedures, safety policies. Patient Rights and visitor permission paperwork signed. Plan of Care explained. Pt verbalized understanding of Rights and Plan of Care. Pain 6/10 Headache pt claims. had been present since given medication to treat Tylenol overdose at main campus. Requested nicorette gum available for nicotine cravings. C/o not being able to sleep in days requesting sleep aid med on MAR.  Goal: To get back on psych meds because I use other drugs to treat myself otherwise. 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  Recent stressor: trying to stop using heroin, increased drinking. When stressed, does drugs, previously played sports, works and keeps self occupied. Acts withdrawn, quiet huddled up.  SI: currently denies, last had SI when took Tylenol and percocet prior to admission. SA: 2 weeks ago OD'd on heroin, was given Narcan, and treated at the new [facility] in [place]. 2011 tried to hand himself, rescued by my feet being held up and denies permanent injury from this event.  HI: denies currently, none as an adult. I was an angry kid and in fights with everyone who looked at me the wrong way. [facility] at least [facility], does not believe diagnosis as Bipolar was accurate.  S&R: on 7th floor a month ago, put in seclusion for not listening to the nurse and being mean. Pt verbalized understanding of procedure regarding safety and S&R events, stated he did not want anyone to be notified of future events should they occur.  AVH: When in withdrawal, hears voices talking about drugs and his brothers. Denies command AH. Denies VH, tactile or olfactory hallucinations.  Stated that he tasted different things while being treated for OD at main campus, attributes this to medication side effects.  Family Hx: Brothers successful suicide x2: [name] intentionally OD'd on his pain meds, found dead. [name] diagnosed as bipolar prior to [name]'s death, became depressed with [name]'s suicide, shot self. Cousins diagnosed as depressed persons. On Dad's side everyone's messed up. Memorial tattoo on pt's right forearm dedicated to dead brothers.  Missed court date about car on January 7th at [employer]. Pt informed that this will need to be addressed when discharged. Pt tried to call public defender while at main campus, could only leave message. Pt concerned he will miss a rescheduled court date and there will be further legal action. Please provide documentation from SW to verify current admission dates and contact info for legal processes, pt says thank you for any assistance with this matter.  Given 400 ibuprofen for headache 6/10 mentioned during interview. Nicorette gum. Explained that catapres given for blood pressure issues, not necessarily withdrawal, pt voiced understanding not a PRN on request. Given dinner meal.  Requested D/c of saline flush and lock, Psych on call Fleet Contras MD ordered discontinue, see MAR.  1800 CIWA=3.  At main campus, pt determined to be + for Hep C and Hep B. Possibly transmitted by IV drug use and/or tattoo. Pt has not told girlfriend at this time.  Will monitor per protocol.

## 2013-12-13 MED FILL — NICORELIEF 2 MG GUM: 2 2 mg | BUCCAL | Qty: 2

## 2013-12-13 MED FILL — FOLIC ACID 1 MG TABLET: 1 1 MG | ORAL | Qty: 1

## 2013-12-13 MED FILL — LORAZEPAM 0.5 MG TABLET: 0.5 0.5 MG | ORAL | Qty: 1

## 2013-12-13 MED FILL — IBUPROFEN 400 MG TABLET: 400 400 MG | ORAL | Qty: 1

## 2013-12-13 MED FILL — HYDROXYZINE PAMOATE 25 MG CAPSULE: 25 25 MG | ORAL | Qty: 1

## 2013-12-13 MED FILL — NICORELIEF 2 MG GUM: 2 2 mg | BUCCAL | Qty: 1

## 2013-12-13 MED FILL — LAMOTRIGINE 25 MG TABLET: 25 25 MG | ORAL | Qty: 1

## 2013-12-13 MED FILL — THIAMINE HCL (VITAMIN B1) 100 MG TABLET: 100 100 MG | ORAL | Qty: 1

## 2013-12-13 MED FILL — THERA 400 MCG TABLET: 400 400 mcg | ORAL | Qty: 1

## 2013-12-13 NOTE — Unmapped (Signed)
Problem: Potential for Harm to Self or Others  Goal: Patient will not present active danger to themselves due to  patient will not present an active danger to themselves due to a psych  illness   Intervention: Obtain clinically significant collateral information  SW spoke with pt's girlfriend, [name], at [phone].  See SW assessment for details.  Intervention: Communicate with family members (with pt???s permission)  Pt gives permission to talk with his girlfriend, [name].  Intervention: Discharge planning  Girlfriend will allow pt to return to her house at time of d/c as long as he stays in drug treatment.  Intervention: Provide chemical dependency counseling  CD specialist will see pt on unit.  Intervention: Other psychotherapeutic interventions (describe)  Psychosocial assessment completed per policy.

## 2013-12-13 NOTE — Unmapped (Signed)
Problem: Other medical condition  Goal: Patient will remain medically stable for follow up in OP set  Intervention: Dispense prescribed medication per MD order  Complained of headache rated 6/10. Received  Ibuprofen 400 mg po prn at 1428 with results pending.

## 2013-12-13 NOTE — Unmapped (Signed)
[facility]  Chemical Dependency Consultation       Patient name: Alexander [name]  DOB: 1986-05-08  MRN: [id number]    Alcohol/Drug History in past 12 months   Have you used any substance (prescription drugs, non prescription drugs,  inhalants, bath salts, etc.) other than directed or abused alcohol in the past 12 months?: Yes    Illegal drug use/abuse past 12 months: Yes    Prescription drug abuse 12 months: No    Non-prescription abuse in last 12 months: Yes    Any alcohol abuse in last 12 months: Yes    IV DRUG USE: Yes    Used chemicals (other than as prescribed) in the past 12 months?: Yes          Chemicals Used:       Type of Other Chemical Used: Alcohol       Type of Other Chemical Used: Other opiates       Type of Other Chemical Used: Heroin                                                            Current Alcohol and drug treatment history: Not Applicable         Past alcohol/drug treatment programs: Not applicable         Any family history of substance abuse problems?: denies    Toxicology screen completed?: Yes (Make comment) (+ benzos, cocaine, opiates)    Other factors: IV drug user (+ Hep C and Hep B, has not told girlfriend)    Other Addictive Behaviors: ETOH abuse    Legal Information  What is your current criminal legal status?: History of incarceration              Current Legal Charges: denies         Strengths and needs for treatment  See progress note.    Treatment Recommendations and needs

## 2013-12-13 NOTE — Unmapped (Signed)
Problem: Major Mood Disorder  Goal: Mania or depressed mood won???t interfere with daily function  Intervention: Dispense prescribed medication per MD order  Compliant with scheduled medication.  Intervention: Assess patient response to interventions  Visible on the unit. Paced at intervals. Multiple request.

## 2013-12-13 NOTE — Unmapped (Signed)
[facility]                                    Social Work Inpatient Psychiatry Summary      Patient name: Alexander [name]     Patient MRN: [id number]  DOB: May 03, 1986  Age: 28 y.o.   Gender: male      Date of admission: 12/12/2013    Circumstances leading to hospitalization:  Pt was sent to inpatient psychiatry from medical s/p overdose attempt on #25 tylenol pm, #5 percocet, and alcohol.  He reports that he needed 22 hours of tylenol antidote.  This is pt's third overdose attempt.  He reports being stressed out by financial concerns and says that being off work is highly stressful: when I have nothing to do, I go ballistic.  Pt denies current SI, denies HI, denies AH/VH.  Pt states he did not follow up with drug/alcohol treatment after last admission because he didn't have a ride there or a phone.      Pt describes himself as impulsive.  He would like to be back on mood stabilizers.  He reports that precipitating event for this attempt is the birthdays of his two brothers who are both deceased (one by overdose and the other by suicide-GSW).    Admission Information: Pt is presently living with his girlfriend, Alexander [name], and her 3 daughters.  He stopped school in 12th grade.  He denies history of abuse.  He does have history of incarceration on weapons charges.  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              Patient Information       How do you wish to be addressed?: [name]    Guardian Type: None         Number of children and their names: none    Person(s) currently caring for children (if minors): n/a    Patient's Sexual Orientation: Heterosexual    Any aspects of your orientation that will help [place] provide better care for you:  None identified    Patient Education Level: 9-11 Grade    Patient Income Source: Employed        Safety           Self Injurious Thoughts: Denies    Self Injurious Behaviors: Yes (Comment)  (history of suicide attempt x3 by overdose)    Thoughts of Harming Others: Denies    Harmful Actions Toward Others: None observed    Any safety concerns:  Pt is using heroin, drinking and at risk for self harm via overdose, has also attempted intentional overdose x3.         Support System            Significant Other: girlfriend, Alexander [name] [phone]    Other Support, Name and relationship: mother    May We Obtain Collateral Information From Family, Friends and Neighbors?: Yes girlfriend, [name]    Mental Health Agency:  none         Psychiatrist:  none      Legal Status             Patient Mental Health Legal Status: Involuntary    What is your current criminal legal status?: History of incarceration         Current Legal  Charges: denies         Legal History: history of incarceration for weapons charges    Alcohol/Drug History Past 12 Months      Are you currently using drugs/alcohol?: Yes    Referral to CD?: Yes    CD History: has not previously entered treatment       Abuse/Trauma History           Abuse History: Denies    Is anyone in the home being abused or neglected?: No         Trauma/Loss History: 2 brothers suicided       Spiritual                         Cultural                         Collateral Information            Collateral Information:  SW spoke with pt's girlfriend, Alexander [name], at [phone].  She is very concerned about pt's well being and feels he is at risk of killing himself, though probably more so unintentionally by overdose than by suicide.  She feels he has a significant problem and is at the end of her patience with him.  She says that if he does not change he will no longer be welcome at her home.  She is willing to give him one more chance to enter treatment and change.         Formulation of Plan            Formulation of Plan:  Pt is a 28 y/o caucasian male transferred from medical s/p overdose on #25 tylenol pm, #5 percocet, and alcohol in a suicide attempt.  Pt denies  present SI while in the hospital and feels that if were to be back on mood stabilizers it would greatly improve his situation.  Pt denies HI, denies AH/VH.  Pt does state he might be willing to go to treatment but that he has to be able to start work on 3/1 as financial concerns are one of his biggest stressors.  SW will speak with CD specialist about consult.

## 2013-12-13 NOTE — Unmapped (Signed)
Occupational Therapy/Therapeutic Recreation Progress Note      ZIYAD DYAR  [id number]      Group: Life Skills  Attendance: Attended  60/60 minutes  Level of Participation: Active, Interacted with peer(s), Interacted with staff, Initiated Comment(s) and Followed Directions    Pt initiated coming to group - pt is bright, pleasant and engaging - he is able to identify + coping skills and + choices to make outside the hospital.  Pt discussed his weaknesses and his pattern of negative thinking when he doesn't have something to do to occupy his time and mind.  Pt is hopeful of getting treatment for his addictions, and wants to be able to figure out what normal is for him.     Sharmin Foulk N Toshie Demelo, CTRS  12/13/2013 12:11 PM

## 2013-12-13 NOTE — Unmapped (Signed)
CD NOTE: I met with pt to complete drug and alcohol assessment. Pt was alert and attentive. Pt reports using Alcohol, Opiates and Heroin. Prior treatment in the past in [place]. Discussed treatment options. Pt stated that he couldn't do residential treatment because of work. Discussed [facility] as an option. Pt was given info on [facility] open access.

## 2013-12-13 NOTE — Unmapped (Signed)
Problem: Other medical condition  Goal: Patient will remain medically stable for follow up in OP set  Intervention: Assess for target symptoms & provide for safety  CIWA -  0 at 2017 no complaints voiced.

## 2013-12-13 NOTE — Unmapped (Signed)
History & Physical    12/17/2013    Alexander [name]    CC: Patient was upset and overdosed    HPI:  This a 28 y.o. single who is living with his girlfriend.  The patient has had a long standing issues dealing with his heroin dependence.  Additionally he has a harder tiime in the winter when he cannot work his job.  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  He finds himself having more diifficulty during those times.  It appears that he is highly talented with small engines and his boss is very supportive of him    It is not quite clear why he took the overdose of tylenol and opiate pills.  He is unable to say why other than he is having a difficult time with his attempts to get away from opiates.      This was considered to be a serious overdose but he is now stating that he is oK and he is not suicidal.  He also is stating to [place] that he does not want to enter treatment because it will interfere with his job.      I find myself wondering if we can do much for him.  He appears to be quite bright.  He denies that he is depressed.  He does have the capacity to make his own decisions.      This is a Mood Disorder nos associated with opiate dependence of moderate severity.  Context is struggle with opiates.  .  Duration is one week    Past Psychiatric History: He was admitted to [place] early in December 2014.    Substance Abuse: He uses heroin    Medical Issues: none    Family History: denied    Social History: he lives with a girlfriend.    Physical Review Of Systems:  Respiratory: negative  Cardiovascular: negative  Gastrointestinal: negative  Neurological: negative    exam performed on medical and it was WNL    Mental Status Evaluation:  Appearance:  age appropriate   Behavior:  social and enjoys being here   Speech:  normal volume   Mood:  fine, feeling  good.   Affect:  normal and very bright   Thought Process:  goal directed   Thought Content:  denies hI/SI   Sensorium:  person, place, time/date and situation   Memory: intact   Cognition:  grossly intact   Insight:  fair   Judgment:  fair       Diagnosis:    Axis I:  Mood Disorder nos, Opiate use disorder    Axis II:  defer    Axis III:  nonw    Axis IV: Struggle with opiates    Axis V:  40    Assessment: this patient is very difficult to treat because he comes in for opiate issues but then he does not want treatment.  We had tried to set him up in treatment last time he was here and had made the referral but he insisted upon leaving when the transfer was almost made.  At this time he continues to refuse treatment.  He is bright social and states that he will pursue treatment outside but not on an inpatient.      Plan    1.  Continue to talk to him about the importance of getting off opiates and getting into treatment.      2.  Continue to assess potential for suicide.  He is certainly denying that he is suicidal at this time    3. Gather further collateral from the girlfriend to see how she feels about his opiate addiction.    Benetta Spar

## 2013-12-13 NOTE — Unmapped (Signed)
Problem: Major Mood Disorder  Goal: Mania or depressed mood won???t interfere with daily function  Intervention: Assess for target symptoms & provide for safety  Socially about on the unit. Social with peer FL. Visited by boss who bought patient food. Patient stated visit went well. affect and mood improved. No suicidal thoughts shared.  Intervention: Dispense prescribed medication per MD order  Compliant with scheduled medication. Denies any complaints of discomfort at this time.

## 2013-12-13 NOTE — Unmapped (Signed)
Problem: Potential for Harm to Self or Others  Goal: Patient will not present active danger to themselves due to  patient will not present an active danger to themselves due to a psych  illness   Intervention: Assess for target symptoms & provide for safety  Pt complained of insomnia.  Pt was monitored for safety.  Intervention: Dispense prescribed medication per MD order  Pt was given Vistaril 25 mg per order with good result.

## 2013-12-13 NOTE — Unmapped (Signed)
Occupational Therapy/Therapeutic Recreation Progress Note      Alexander [name]  [id number]    Group: Other; Art Group  Attendance: Attended and 60/60 minutes  Level of Participation: Active, Interacted with peer(s), Interacted with staff, Remained on Task/Topic/Number of Minutes: 60/60, Initiated Comment(s) and Followed Directions    Pt readily attended group upon invitation.  Participated in freehand painting task on a canvas with great focus and organization; Pt was very attentive to project details.  Pt was intermittently social with staff and peers; Expressed interest in painting during his free time after discharge and verbalized that it would be a lot less expensive than current bad habits.  Stated that he would like to try painting also as a way to bond with his teenaged daughters.  Pt socialized about his prior involvement in running; That he used to run less than a 6 minute mile; Reported that he has not run in a while, but interested in resuming this hobby.  Pt was pleasant, cooperative, social, and demonstrated good cognitive skills throughout group.     Shaleta Ruacho L JACOBS, OTR/L  12/13/2013 4:00 PM

## 2013-12-14 MED ORDER — ibuprofen (ADVIL,MOTRIN) tablet 800 mg
800 | Freq: Four times a day (QID) | ORAL | Status: AC | PRN
Start: 2013-12-14 — End: 2013-12-17
  Administered 2013-12-14 – 2013-12-17 (×5): 800 mg via ORAL

## 2013-12-14 MED ORDER — hydrOXYzine pamoate (VISTARIL) capsule 50 mg
50 | Freq: Three times a day (TID) | ORAL | Status: AC | PRN
Start: 2013-12-14 — End: 2013-12-17
  Administered 2013-12-15 – 2013-12-16 (×2): 50 mg via ORAL

## 2013-12-14 MED FILL — NICORELIEF 2 MG GUM: 2 2 mg | BUCCAL | Qty: 1

## 2013-12-14 MED FILL — CLONIDINE HCL 0.1 MG TABLET: 0.1 0.1 MG | ORAL | Qty: 1

## 2013-12-14 MED FILL — HYDROXYZINE PAMOATE 50 MG CAPSULE: 50 50 MG | ORAL | Qty: 1

## 2013-12-14 MED FILL — THIAMINE HCL (VITAMIN B1) 100 MG TABLET: 100 100 MG | ORAL | Qty: 1

## 2013-12-14 MED FILL — RISPERIDONE 2 MG DISINTEGRATING TABLET: 2 2 MG | ORAL | Qty: 1

## 2013-12-14 MED FILL — LORAZEPAM 0.5 MG TABLET: 0.5 0.5 MG | ORAL | Qty: 1

## 2013-12-14 MED FILL — NICORELIEF 2 MG GUM: 2 2 mg | BUCCAL | Qty: 2

## 2013-12-14 MED FILL — IBUPROFEN 800 MG TABLET: 800 800 MG | ORAL | Qty: 1

## 2013-12-14 MED FILL — LAMOTRIGINE 25 MG TABLET: 25 25 MG | ORAL | Qty: 1

## 2013-12-14 MED FILL — THERA 400 MCG TABLET: 400 400 mcg | ORAL | Qty: 1

## 2013-12-14 MED FILL — IBUPROFEN 400 MG TABLET: 400 400 MG | ORAL | Qty: 1

## 2013-12-14 MED FILL — FOLIC ACID 1 MG TABLET: 1 1 MG | ORAL | Qty: 1

## 2013-12-14 MED FILL — HYDROXYZINE PAMOATE 25 MG CAPSULE: 25 25 MG | ORAL | Qty: 1

## 2013-12-14 NOTE — Unmapped (Signed)
Occupational Therapy/Therapeutic Recreation Progress Note      Alexander [name]  [id number]      Group: Task  Attendance: Attended  70/70 minutes  Level of Participation: Active, Interacted with peer(s), Interacted with staff, Remained on Task/Topic/Number of Minutes: 70/70, Initiated Comment(s) and Followed Directions    Pt readily attended group today upon invite. PT was out pacing in the halls when writer was gathering for group. Pt was polite and expressive in conversation. Pt worked independently and gathered all his own materials needed. PT was engaging with peers and staff. PT at one point got up and stated he needed to walk or stretch his legs. Pt had good focus and concentration.   Thea Gist, CTRS  12/14/2013 12:54 PM

## 2013-12-14 NOTE — Unmapped (Signed)
Occupational Therapy/Therapeutic Recreation Progress Note      GAREN WOOLBRIGHT  [id number]      Group: Life Skills  Attendance: Attended  30/30 minutes  Level of Participation: Active, Interacted with peer(s), Interacted with staff, Initiated Comment(s) and Followed Directions    Pt readily attended and participated int he group.  PT was focused and attentive through out the activity. PT enjoyed some of the trivia questions and was encouraging and helpful to others in the group.    Thea Gist, CTRS  12/14/2013 2:33 PM

## 2013-12-14 NOTE — Unmapped (Signed)
Patient seizures precautions, complain of  Pain and stated pain level at 6 on 1 to 10 scale ibuprofen one table and vistaril 50 mg given for itching.

## 2013-12-14 NOTE — Unmapped (Signed)
[facility] Psychiatry  Subsequent Day Note    Alexander [name]  [id number]    Interval History:  One day    Hospital Day: 2    Chief Complaint: attempted suicide    Updates to Psych/Family/Substance/Medical/Social History:  Patient is baffling in that he does not go along with our recommendations in spite of significant danger and he does understand that potential danger given that he just made a significant suicide attempt.  He states that when he gets upset, he tends to act impulsively but he not even able to tell us what the issue was that led to the overdose.     Last time, we had arranged for him to go to a program but when we were pressuring him to go, he refused to go and demanded to be discharged.  At this time, he stating that he does not want a program because of his work schedule even though it is at least 6 weeks before he starts work again.      Additionally it appears that he is the brightest patient on the unit.  It appears that he is enjoying himself here. He is social and is enjoying the contact with peers.  He does have a good job and his employer was here to give him things.  He is able to go back to work on March 1.     This is a mood disorder nos associated with heroin use disorder of moderate severity. Context is onoging use and duration is omonths.      Medical Review Of Systems:  Respiratory: negative  Cardiovascular: negative  Gastrointestinal: negative  Neurological: positive for headaches    Objective:  Vitals:  Vital signs in last 24 hours:  Temp:  [97.1 ??F (36.2 ??C)-97.8 ??F (36.6 ??C)] 97.8 ??F (36.6 ??C)  Heart Rate:  [64-100] 84  Resp:  [16-20] 18  BP: (117-146)/(63-104) 146/104 mmHg      Labs:  Recent Results (from the past 48 hour(s))   HEPATITIS B CORE ANTIBODY    Collection Time     12/12/13 12:44 PM       Result Value Range    Hep B Core Total Ab Nonreactive  Nonreactive   HEPATITIS B SURFACE ANTIBODY    Collection Time     12/12/13 12:44 PM       Result Value Range    Hep B S  Ab Reactive (*) Nonreactive   HEPATITIS B SURFACE ANTIGEN    Collection Time     12/12/13 12:44 PM       Result Value Range    Hepatitis B Surface Ag Nonreactive  Nonreactive   HEPATITIS A TOTAL ANTIBODY    Collection Time     12/12/13 12:44 PM       Result Value Range    Hep A IgG Nonreactive  Nonreactive   HEPATITIS C RNA, QUANTITATIVE, PCR    Collection Time     12/12/13 12:44 PM       Result Value Range    International Units [id number]      IU log10 6.625         Scheduled Meds:   ??? folic acid  1 mg Oral Daily 0900    And   ??? multivitamin  1 tablet Oral Daily 0900    And   ??? thiamine  100 mg Oral Daily 0900   ??? lamoTRIgine  25 mg Oral Daily 0900   ??? LORazepam  0.5 mg Oral  TID           Mental Status Evaluation:  Appearance:  age appropriate   Behavior:  Bright and social. Very visible in the hallways.   Speech:  normal volume   Mood:  Feeling good   Affect:  normal and cheerful and outgoing   Thought Process:  goal directed   Thought Content:  Denies SI/Hi.  No indication of mania   Sensorium:  person, place, time/date and situation   Memory: intact   Cognition:  grossly intact   Insight:  fair   Judgment:  fair     Assessment and Plan:                 AXIS I: Mood Disorder nos.  Opiate use disorder      AXIS II: Deferred     AXIS III: none    Assessment: It appears that this patient will not allow [place] to do anything for him.  We feel that we have a good treatment for him but he refuses it.  He denies that he is sucidal.    Plan: Possibly discharge shortly.      Benetta Spar  12/14/2013

## 2013-12-14 NOTE — Unmapped (Signed)
seclusive to room and bed this am. Out for breakfast and medications.states he is feeling ok. Denies feeling suicidal. Able to cfs. Rates depression 3 out of 10.social with peers.told nurse is not ready for discharge today.

## 2013-12-14 NOTE — Unmapped (Signed)
Pt. Up to nurses station c/o headache pain. Received motrin 800 mg po. States this is one of his withdrawal symptons. Has been walking hallways aimlessly.is getting scheduled ativan. No other complaints voiced.

## 2013-12-14 NOTE — Unmapped (Signed)
Problem: Major Mood Disorder  Goal: Mania or depressed mood won???t interfere with daily function  Intervention: Assess for target symptoms & provide for safety  15 minutes check place per protocol, no need voiced at this time  Intervention: Dispense prescribed medication per MD order  Medication dispensed per MD order. Pt medication compliant.  Intervention: Assess patient response to interventions  Calm and cooperative,vital sign staple, no sign of seizure noted at this time.      Problem: Potential for Harm to Self or Others  Goal: Patient will not present active danger to themselves due to  patient will not present an active danger to themselves due to a psych  illness   Intervention: Assess for target symptoms & provide for safety  Safety maintained.seen on the unit an in the tv room watching program of choice, no need voiced at this time.  Intervention: Dispense prescribed medication per MD order  Medication dispensed per MD order. Pt medication compliant.  Intervention: Assess patient response to interventions  Calm and cooperative, voiced tolerable pain level and safe at this time.      Problem: Other medical condition  Goal: Patient will remain medically stable for follow up in OP set  Intervention: Dispense prescribed medication per MD order  Medication dispensed per MD order. Pt medication compliant.  Intervention: Assess patient response to interventions  Vital signs staple, med compliant,no sign of pain or  Distress noted at this time,continues seizure precautions and no withdrawal symptoms noted a t he is comfortable. Will cont

## 2013-12-15 MED FILL — NICORELIEF 2 MG GUM: 2 2 mg | BUCCAL | Qty: 1

## 2013-12-15 MED FILL — LORAZEPAM 0.5 MG TABLET: 0.5 0.5 MG | ORAL | Qty: 1

## 2013-12-15 MED FILL — CLONIDINE HCL 0.1 MG TABLET: 0.1 0.1 MG | ORAL | Qty: 1

## 2013-12-15 MED FILL — HYDROXYZINE PAMOATE 50 MG CAPSULE: 50 50 MG | ORAL | Qty: 1

## 2013-12-15 MED FILL — IBUPROFEN 800 MG TABLET: 800 800 MG | ORAL | Qty: 1

## 2013-12-15 MED FILL — RISPERIDONE 2 MG DISINTEGRATING TABLET: 2 2 MG | ORAL | Qty: 1

## 2013-12-15 MED FILL — THIAMINE HCL (VITAMIN B1) 100 MG TABLET: 100 100 MG | ORAL | Qty: 1

## 2013-12-15 MED FILL — LAMOTRIGINE 25 MG TABLET: 25 25 MG | ORAL | Qty: 1

## 2013-12-15 MED FILL — FOLIC ACID 1 MG TABLET: 1 1 MG | ORAL | Qty: 1

## 2013-12-15 MED FILL — THERA 400 MCG TABLET: 400 400 mcg | ORAL | Qty: 1

## 2013-12-15 NOTE — Unmapped (Signed)
Problem: Major Mood Disorder  Goal: Mania or depressed mood won???t interfere with daily function  Outcome: Progressing  Patient is not voicing depression. He is asking about another medication to help with his BAD. He states he is less shaky. Social with peers and full range of affect in interactions. Taking meds, attending to ADLs and independent functioning

## 2013-12-15 NOTE — Unmapped (Signed)
Problem: Major Mood Disorder  Goal: Mania or depressed mood won???t interfere with daily function  Intervention: Dispense prescribed medication per MD order  Medication compliant,denies any pain at this point,he is monitored for seizure and withddrawal at this time, safety maintained.  Intervention: Assess patient response to interventions  No needs voiced at this time, calm and cooperative, visible and interacts with staff and patients without difficulty.      Problem: Other medical condition  Goal: Patient will remain medically stable for follow up in OP set  Intervention: Assess for target symptoms & provide for safety  Safety maintained.  Intervention: Dispense prescribed medication per MD order  No scheduled/PRN medications given at this time. No needs voiced at this time,will continue to monitor.

## 2013-12-15 NOTE — Unmapped (Signed)
Problem: Major Mood Disorder  Goal: Mania or depressed mood won???t interfere with daily function  Intervention: Assess for target symptoms & provide for safety  Pt is in the room. Eyes closed, respiration regular, easy and unlabored.   No complaints of discomfort or pain.   No voices, suicidal or homicidal ideations at this time. Safety is maintained.  Intervention: Dispense prescribed medication per MD order  Pt did not require any PRN during the night.  Bethel Born, RN

## 2013-12-15 NOTE — Unmapped (Addendum)
Problem: Other medical condition  Goal: Patient will remain medically stable for follow up in OP set  Outcome: Progressing  Vitals stable, patient voices he is less shaky and doing ok medically.      Patient voices that his plan level is down to 3. States this is the least his pain has been recently. Pleasant and cooperative.    Patient felt that he may need some Clonidine. Vitals done and he did not meet parameters, but OD contacted and a dose of Clonidine given after discussion of sxs.    Patient is pleased.

## 2013-12-15 NOTE — Unmapped (Signed)
Occupational Therapy/Therapeutic Recreation Progress Note      Alexander [name]  [id number]      Group: Task  Attendance: Attended  75/90 minutes  Level of Participation: Active, Interacted with peer(s), Interacted with staff, Remained on Task/Topic/Number of Minutes: 75/75, Initiated Comment(s) and Followed Directions    Pt attended group upon invite. PT wanted to listen to music and specifically asked to use the boom box and headphones to listen to his own station. PT was observed nodding his head and dancing in his chair to the music.  At one point pt became relaxed and put his head down on the table to enjoy the music. Pt later asked for a suduko puzzle to work on while on the unit. PT chose several puzzles to take out of the group.  PT was pleasant and cooperative and  Social with both staff and select peers.  Thea Gist, CTRS  12/15/2013 12:27 PM

## 2013-12-15 NOTE — Unmapped (Signed)
[facility] Psychiatry  Subsequent Day Note    Alexander [name]  [id number]    Interval History:  One day    Hospital Day: 3    Chief Complaint: attempted suicide    Updates to Psych/Family/Substance/Medical/Social History: Patient continues to be baffling.  It appears that he is enjoying himself here.  One sees him in the hallway and he is bright and social and enjoys the interaction with people both staff and other patients.  He denies that he is suicidal. We are still quite concerned about his overdose and his ongoing use of drugs but he is refusing to accept treatment in a residential.  He states that he is unable to go into a residential program because he has bills to pay and a family to support. He definitely indicates that he wants to be around to care for the family and it appears that he is close to his boss and want to go back to work on Mar 1. We may have gone as far as we can go.      This is a mood disorder nos associated with heroin use disorder of moderate severity. Context is onoging use and duration is omonths.      Medical Review Of Systems:  Respiratory: negative  Cardiovascular: negative  Gastrointestinal: negative  Neurological: positive for headaches    Objective:  Vitals:  Vital signs in last 24 hours:  Temp:  [97.6 ??F (36.4 ??C)-97.8 ??F (36.6 ??C)] 97.8 ??F (36.6 ??C)  Heart Rate:  [82-123] 82  Resp:  [17-18] 18  BP: (132-145)/(70-88) 138/76 mmHg      Labs:  No results found for this or any previous visit (from the past 48 hour(s)).    Scheduled Meds:   ??? folic acid  1 mg Oral Daily 0900    And   ??? multivitamin  1 tablet Oral Daily 0900    And   ??? thiamine  100 mg Oral Daily 0900   ??? lamoTRIgine  25 mg Oral Daily 0900   ??? LORazepam  0.5 mg Oral TID           Mental Status Evaluation:  Appearance:  age appropriate   Behavior:  Bright and social. Very visible in the hallways.   Speech:  normal volume   Mood:  Feeling good   Affect:  normal and cheerful and outgoing   Thought Process:  goal  directed   Thought Content:  Denies SI/Hi.  No indication of mania   Sensorium:  person, place, time/date and situation   Memory: intact   Cognition:  grossly intact   Insight:  fair   Judgment:  fair     Assessment and Plan:                 AXIS I: Mood Disorder nos.  Opiate use disorder      AXIS II: Deferred     AXIS III: none    Assessment: It appears that this patient will not allow [place] to do anything for him.  We feel that we have a good treatment for him but he refuses it.  He denies that he is sucidal.    Plan: Possibly discharge shortly.      Benetta Spar  12/15/2013

## 2013-12-16 MED FILL — NICORELIEF 2 MG GUM: 2 2 mg | BUCCAL | Qty: 1

## 2013-12-16 MED FILL — THERA 400 MCG TABLET: 400 400 mcg | ORAL | Qty: 1

## 2013-12-16 MED FILL — LORAZEPAM 0.5 MG TABLET: 0.5 0.5 MG | ORAL | Qty: 1

## 2013-12-16 MED FILL — NICORELIEF 2 MG GUM: 2 2 mg | BUCCAL | Qty: 2

## 2013-12-16 MED FILL — IBUPROFEN 800 MG TABLET: 800 800 MG | ORAL | Qty: 1

## 2013-12-16 MED FILL — RISPERIDONE 2 MG DISINTEGRATING TABLET: 2 2 MG | ORAL | Qty: 1

## 2013-12-16 MED FILL — LAMOTRIGINE 25 MG TABLET: 25 25 MG | ORAL | Qty: 1

## 2013-12-16 NOTE — Unmapped (Signed)
Problem: Major Mood Disorder  Goal: Mania or depressed mood won???t interfere with daily function  Intervention: Assess for target symptoms & provide for safety  Social with peers. Denies any physical complaints  Intervention: Dispense prescribed medication per MD order  Complaint with scheduled medication.  Intervention: Address personal hygiene and care  Showered     Goal: Demonstrates improvement in S/S  Mood, Sleep, Energy and Appetite  No s/s of pain anxiety or discomfort. No seizure activity observed.    Problem: Potential for Harm to Self or Others  Goal: Patient will not present active danger to themselves due to  patient will not present an active danger to themselves due to a psych  illness   Affect mood and behavior improved. No suicidal or homicidal thoughts voiced.

## 2013-12-16 NOTE — Unmapped (Signed)
CD GROUP NOTE: Pt attended group. Pt was alert and attentive. Pt provided feedback to other group members.

## 2013-12-16 NOTE — Unmapped (Signed)
[facility] Psychiatry  Subsequent Day Note    Alexander [name]  [id number]    Interval History:  One day    Hospital Day: 4    Chief Complaint: attempted suicide    Updates to Psych/Family/Substance/Medical/Social History: pt seen, in halls getting along, tells me he hopes get back to work soon, he would like go home soon as possible, he is getting along, taking meds, slept and ate.  Seems ok now, i talked with him we likely would d/c him in am and he can f/u and he thinks that is good plan  This is a mood disorder nos associated with heroin use disorder of moderate severity. Context is onoging use and duration is omonths.      Medical Review Of Systems:  Respiratory: negative  Cardiovascular: negative  Gastrointestinal: negative  Neurological: positive for headaches    Objective:  Vitals:  Vital signs in last 24 hours:  Temp:  [97.2 ??F (36.2 ??C)-97.4 ??F (36.3 ??C)] 97.2 ??F (36.2 ??C)  Heart Rate:  [78-91] 78  Resp:  [14-18] 14  BP: (140-151)/(87-88) 140/87 mmHg      Labs:  No results found for this or any previous visit (from the past 48 hour(s)).    Scheduled Meds:   ??? lamoTRIgine  25 mg Oral Daily 0900   ??? LORazepam  0.5 mg Oral TID   ??? multivitamin  1 tablet Oral Daily 0900           Mental Status Evaluation:  Appearance:  age appropriate   Behavior:  Bright and social. Very visible in the hallways.   Speech:  normal volume   Mood:  Feeling good   Affect:  normal and cheerful and outgoing   Thought Process:  goal directed   Thought Content:  Denies SI/Hi.  No indication of mania   Sensorium:  person, place, time/date and situation   Memory: intact   Cognition:  grossly intact   Insight:  fair   Judgment:  fair     Assessment and Plan:                 AXIS I: Mood Disorder nos.  Opiate use disorder      AXIS II: Deferred     AXIS III: none    Assessment: It appears that this patient will not allow [place] to do anything for him.  We feel that we have a good treatment for him but he refuses it.  He denies that he  is sucidal.    Plan: will plan for d./c in am      Alexander [name] J Alexander [name]  12/16/2013

## 2013-12-16 NOTE — Unmapped (Addendum)
Problem: Major Mood Disorder  Goal: Mania or depressed mood won???t interfere with daily function  Alexander [name] has been visible all evening in the dining room playing cards and watching television with peers. He appears to be very comfortable on the unit with staff and male peers. Pt compliant with HS medication and also was given a Risperdal MTab 2 mg, by request, for anxiety/agitation, medication effective. Side rails padded d/t seizure potential. Denies SI. Ate several snacks, Nicorette gum x2. Will continue to monitor for safety and needs.    Q4 vitals taken @ 1715  BP:150/98 HR: 82  T:97.7  O2: 100%  R: 16                           @ 2315  BP:138/76 HR: 97  T:98.2  O2: 100%  R: 18  Alexander [name] wishes not be bothered through the night with obtaining vital signs. Dr notified and nursing communication to follow.    Intervention: Assess for target symptoms & provide for safety  Safety maintained.  Intervention: Dispense prescribed medication per MD order  Medication dispensed per MD order. Pt medication compliant.    Risperdal MTab 2 mg @ 2125  Nicorette Gum x2      Problem: Potential for Harm to Self or Others  Goal: Patient will not present active danger to themselves due to  patient will not present an active danger to themselves due to a psych  illness   Pt has not shared any HI/SI and has exhibited no unsafe behaviors.

## 2013-12-16 NOTE — Unmapped (Signed)
Occupational Therapy/Therapeutic Recreation Progress Note      Alexander [name]  [id number]      Group: Leisure Ed  Attendance: Attended  90/90 minutes  Level of Participation: Active, Interacted with peer(s), Interacted with staff, Initiated Comment(s) and Followed Directions    Pt readily attended group session. Upon arrival, pt was social with peers/staff and participated within various activities provided within Recreation Room. Throughout session, pt appeared to enjoy himself AEB display of smiles and laughter. Pt stated I'm glad you guys brought [place] here today, I really needed to do something active to staff as he arrived back onto unit. Pt was cooperative and presented no issues.    Alexander [name], COTA/L  12/16/2013 4:29 PM    Signed by: Alexander [name], Alexander [name] 12/18/13 [phone]

## 2013-12-16 NOTE — Unmapped (Signed)
Problem: Potential for Harm to Self or Others  Goal: Patient will not present active danger to themselves due to  patient will not present an active danger to themselves due to a psych  illness     SW met briefly with pt who was pleasant and talkative.  He interacts well with his peers and smiles a great deal.  Pt would be good for DBT group tomorrow and most likely will be discharged tomorrow.

## 2013-12-16 NOTE — Unmapped (Signed)
Problem: Other medical condition  Goal: Patient will remain medically stable for follow up in OP set  Intervention: Dispense prescribed medication per MD order  Complained  of headache requested and received Ibuprofen 800 mg po prn with results pending.

## 2013-12-16 NOTE — Unmapped (Signed)
Problem: Major Mood Disorder  Goal: Mania or depressed mood won???t interfere with daily function  Intervention: Assess for target symptoms & provide for safety  Patient rested comfortablyl overnight.   No distress noted.   Denies pain  Safety maintained.      Intervention: Dispense prescribed medication per MD order  Pt did not require any PRN during the night.  Myndi Wamble, RN

## 2013-12-16 NOTE — Unmapped (Signed)
Problem: Major Mood Disorder  Goal: Mania or depressed mood won???t interfere with daily function  Pleasant on unit social with male peer CS. Denies any complaints. Hopes to be discharged tomorrow. Affect and mood much improved.

## 2013-12-16 NOTE — Unmapped (Signed)
SW met briefly with pt who was pleasant and talkative.  He interacts well with his peers and smiles a great deal.  Pt would be good for DBT group tomorrow and most likely will be discharged tomorrow.

## 2013-12-16 NOTE — Unmapped (Signed)
Occupational Therapy/Therapeutic Recreation Progress Note      Alexander [name]  [id number]      Group: Wellness  Attendance: Attended 75/75 minutes  Level of Participation: Active, Interacted with peer(s), Interacted with staff, Initiated Comment(s) and Followed Directions    Pt readily attended group; pt actively participated in introductions and discussion and was attentive to peers. Pt presented with a bright affect and maintained a pleasant mood. Pt was able to voice his reason for hospitalization and stated his main priority is sobriety. Pt displayed increased insight and listed all of the things he has to look forward to in his life. Pt verbalized that he doesn't want to hurt anyone in his family and disclosed that two of his brothers committed suicide. Pt processed his plan of attending weekly outpt tx at [facility], stay focused on his career, seek out a positive support system, and to engage in positive outlets during his free time. Pt plans to work out and get back into exercise routines he had done in the past. Pt was supportive to peers.     Mateo Flow, CTRS  12/16/2013 1:41 PM

## 2013-12-17 MED ORDER — lamoTRIgine (LAMICTAL) 25 MG tablet
25 | ORAL_TABLET | Freq: Every day | ORAL | 1.00 refills | 30.00000 days | Status: AC
Start: 2013-12-17 — End: 2014-07-03

## 2013-12-17 MED ORDER — risperiDONE (RISPERDAL) 2 MG tablet
2 | ORAL_TABLET | Freq: Every evening | ORAL | Status: AC
Start: 2013-12-17 — End: 2014-07-03

## 2013-12-17 MED FILL — LAMOTRIGINE 25 MG TABLET: 25 25 MG | ORAL | Qty: 1

## 2013-12-17 MED FILL — LORAZEPAM 0.5 MG TABLET: 0.5 0.5 MG | ORAL | Qty: 1

## 2013-12-17 MED FILL — THERA 400 MCG TABLET: 400 400 mcg | ORAL | Qty: 1

## 2013-12-17 MED FILL — NICORELIEF 2 MG GUM: 2 2 mg | BUCCAL | Qty: 2

## 2013-12-17 NOTE — Unmapped (Signed)
Discharge Evaluation    Admit date 12/13/2012  D/c eval 12/17/2012    Discharge Diagnosis   Mood nos  Depression nos  opoid addiction    Discharge Medications     Medication List      TAKE these medications, which are NEW      Quantity/Refills    lamoTRIgine 25 MG tablet   Commonly known as:  LAMICTAL   Take 1 tablet (25 mg total) by mouth daily.    Quantity:  30 tablet   For:  Depression, MOOD   Refills:  1       risperiDONE 2 MG tablet   Commonly known as:  RISPERDAL   Take 1 tablet (2 mg total) by mouth At bedtime.    Quantity:  30 tablet   Stop taking on:  12/17/2014   For:  CALM MIND, MOOD   Refills:  1         STOP taking these medications         citalopram 20 MG tablet   Commonly known as:  CELEXA       ibuprofen 600 MG tablet   Commonly known as:  ADVIL,MOTRIN       lithium carbonate 600 MG capsule         Where to Get Your Medications    These are the prescriptions that you need to pick up.        You may get the following medications from any pharmacy   -  lamoTRIgine 25 MG tablet   -  risperiDONE 2 MG tablet                      Reason for admit  CC overdose  HPI: 28 y.o. male with a PMH of polysubstance abuse and depression who presented to the emergency department with a chief complaint of taking 25 Tylenol PM's, 5 5 mg Percocet, and a twelve pack of beer around 11 PM yesterday (12/09/12) evening. Patient said that he was depressed and wanted to end it all. He had one episode of vomiting after ingestion, but he has not vomited since and there were no pills in the vomit. Patient was initially taken to [facility], but when his Tylenol ingestion was discovered, he was taken to the ED at [facility]. Patient denies any recent illness, SOB, or CP. Patient does endorse some abdominal pain. Patient says that he has had a lot of increased stress recently, and he has been drinking more as he tries to quit heroin. Patient has not used heroine in a few days, drinks a 12 pack a beer daily, and does almost everything else. He  has not used any benzos in a long time, and he has not used cocaine in days either. Patient has been depressed all his life. Both of his brothers committed suicide and his father died.   In the ED, patient was started on N-acetylcysteine, given Valium, and a rally pack. Patient was alert and oriented and denied current SI/HI.   Patient was found lying in bed, with sitter at bedside. He endorsed depressed mood for a long time, worsened since his brother committed suicide in 2008. He said he is unable to tell how much depressed he actually feels, because he also uses a lot of drugs which makes his mood change frequently. Over the last month, he felt worse as December is the birthday dates of both his brother who committed suicide. He attempted to overdose on heroin and percocet  3 times in the last 3 weeks, specifically as a way to die. He would wake up in the morning anyway. When asked how he feels right now, he says he feels stupid and that he is not suicidal anymore. He would like to quit his heroin and alcohol use, and is open to start treatment. He gives permission to call his girlfriend to obtain collateral Linton Rump [place], [phone]).   He describes drinking 12-pack beer daily. He describes mild withdrawal symptoms but denies prior DT's or withdrawal seizures. He uses heroin daily (up to 2 g/d), snorting.   He denies prior h/o manic symptoms. However, he describes short-lived periods (few hours) of increased energy, hope, mood, and drive to work better and do better. He denies that these symptoms have ever lasted more than 4 days, or that they were accompanied by elated mood, irritable mood, decreased need for sleep. Has been sober for 3 months, after 30 days of rehab and being on lithium, clonazepam, and citalopram.   Collateral information from girlfriend Linton Rump Terrilee Croak, [phone]): patient's GF was not aware of patient's overdose, although she was aware that patient has called 911 to come to [facility] because  feeling suicidal. Per GF, patient has not been voicing SI (just a little) recently. She is not concerned whether he will kill himself (as SA) but thinks he may kill himself unintentionally by drinking too much or overdosing. She does not feel he is a threat and will accept him back home if he is discharged. She feels hopeless about him because he will not enter treatment, will keep drinking and using drugs, and will not improve. He was admitted to psych hospital and relapsed the same day he was discharged.   context: suicidal attempt and opioid and alcohol withdrawal   severity: severe   location: mood disturbance   associated symptoms: suicidal ideation, hopelessness, helplessness   modifiers: non-adherence to meds   duration: acute   Past Psychiatric History:   Prior hospitalizations: multiple, for depressed mood and suicidal ideation. Last one on 11/14/2013 for 3 days at [facility], when he was also requesting help with mental health and substance abuse tx, but when he was offered residential rehab but refused   Prior diagnoses: depression, bipolar disorder, mood disorder, opiate use disorder, alcohol use disorder   Prior medication trials: depakote, lithium, celexa, klonopin, seroquel, effexor, zoloft, prozac   Outpatient Treatment: not connected to any at the moment. One year ago was in [place] where he had outpatient tx   Suicide Attempts: multiple (overdose), tried to hang himself once   Substance Use History:   Nicotine: 2 ppd   Alcohol: 12 beers per day   Illicits: heroin, opiates (pills) past use of crack cocaine and MJ   Past Medical History:   Past Medical History    Diagnosis  Date    ???  Depression     ???  Drug abuse and dependence     ???  H/O tooth extraction     ???  Thyroid disease     ???  Substance induced mood disorder     ???  PTSD (post-traumatic stress disorder)     ???  Anxiety     ???  Bipolar disorder     ???  Bronchitis     ???  Liver disease       Patient unaware of actual dx      Past Surgical History    Procedure   Laterality  Date    ???  Mandible surgery        Social/Developmental History:   Living with his older girlfriend and her three daughters in [place]. No children of his own.   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  Drug abuse  Brother     ???  Hypertension  Mother     ???  Transient ischemic attack  Mother     ???  Seizures  Father     ???  Hypertension  Father     ???  Cancer  Father       Medical: see above   Psychiatric: bipolar disorder   History of completed suicide: 2 brothers   Allergies:   No Known Allergies   Home Medications:   No current facility-administered medications on file prior to encounter.      Current Outpatient Prescriptions on File Prior to Encounter    Medication  Sig  Dispense  Refill    ???  citalopram (CELEXA) 20 MG tablet  Take 1 tablet (20 mg total) by mouth daily.  30 tablet  0    ???  ibuprofen (ADVIL,MOTRIN) 600 MG tablet  Take 1 tablet (600 mg total) by mouth every 6 hours as needed for Pain.  30 tablet  0    ???  lithium carbonate 600 MG capsule  Take 600 mg by mouth 2 times a day with meals.        Current Medications ordered:   Scheduled Meds:   ???  acetylcysteine (ACETADOTE) *third dose* infusion  100 mg/kg  Intravenous  Once    ???  folic acid  1 mg  Oral  Daily 0900     And    ???  multivitamin  1 tablet  Oral  Daily 0900     And    ???  thiamine  100 mg  Oral  Daily 0900    ???  heparin (porcine)  5,000 Units  Subcutaneous  Q8H      Continuous Infusions:   ???  sodium chloride  125 mL/hr (12/10/13 2126)      PRN Meds:.ibuprofen, nicotine (polacrilex), ondansetron, ondansetron   ROS: Denies trouble with fever, rash, headache, vision changes, chest pain, shortness of breath, nausea, extremity pain, weakness, dysuria.    OBJECTIVE:   Vitals:   Filed Vitals:    12/16/13 0654 12/16/13 1914 12/16/13 2313 12/17/13 0811   BP: 140/87 150/98 138/76 130/76   Pulse: 78 82 97 102   Temp: 97.2 ??F (36.2 ??C) 97.7 ??F (36.5 ??C) 98.2 ??F (36.8 ??C) 96.3 ??F (35.7 ??C)   TempSrc: Oral Oral Oral Oral   Resp: 14 16 16 16    SpO2: 99% 100%         Physical Exam:   Not performed at this encounter   Mental Status Exam on admit:   Appearance: appears stated age, fair eye contact   Behavior/Movement/Muscle Tone: no PMR/PMA, no abnormal movements appreciated,   Speech: tone, prosody, volume, rate, production wnl, fluent nonpressure   Mood/Affect: better, full range, mood congruent, content appropriate   Thought Process: goal directed, linear, no FOI, no LOA   Thought Content: Denies SI/HI,  no delusions expressed, no AH/VH and not RTIS   Orientation: alert, oriented to person place and situation   Fund Of Knowledge: appears consistent with average intellect   Memory: grossly intact to recent and remote content discussed   Insight/Judgment: limited/limited   Labs:   Complete Blood Count:  Recent Labs     12/10/13   1409  12/10/13   2059  12/11/13   0325    WBC  8.7  9.1  6.3    HGB  15.5  14.2  15.1    HCT  45.0  41.0  44.2    MCV  88.3  88.5  88.9    PLT  212  192  174      Basic Metabolic Panel:  Recent Labs     12/10/13   1310  12/10/13   1409  12/11/13   0325    NA  146  141  142    K  4.1  3.9  3.8    CL  105  107  109    CO2  27  25  24     BUN  11  11  10     MG  --  1.9  1.8    PHOS  2.2*  --  --      Hepatic Panel:  Recent Labs     12/10/13   1310  12/10/13   1409  12/11/13   1138    AST  --  158*  184*    ALT  --  299*  285*    ALBUMIN  4.9  4.7  4.1      Imaging:   CT scan head (08/30/2013):   Findings:   There is a curvilinear area of ill-defined high attenuation in the left caudate head (series 2 image 16), not definitely seen on prior study and likely an artifact. Otherwise, the remainder of the brain parenchyma is normal in attenuation. There is  no areas of suspected intracranial hemorrhage or mass effect. The ventricles, sulci, and cisterns are normal in size and configuration. No extra-axial fluid is seen. There is moderate to severe mucosal thickening of the left maxillary sinus with internal high attenuation, progressed since prior study. Evaluation of the face will be reported separately. No abnormalities of the calvarium are identified.   Impression:   No evidence of acute intracranial hemorrhage or mass effect.   Hospital course ASSESSMENT AND RECOMMENDATIONS: 28 y.o. male admitted to Medicine service on 12/10/2013 for overdose on Tylenol and Percocet as a suicide attempt.   1. 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 Has h/o prior adequate treatment when on mood stabilizers. We would recommend:   - startted lamotrigine 25 mg qday PO for 14 days, with further titration to 25 mg BID PO for additional 14 days. After that, titrate up to 50 mg BID PO for one week, and then up to 100 mg PO BID.  Was admitted to ward, titration started, he is on the 25mg  a day at time of d/c.  Tolerating and doing ok.    Was engaged in groups and unit activities, did ok on ward, no acting out  2. Alcohol use disorder/alcohol withdrawal:   - rally pack   - CIWA protocol and lorazepam per protocol done, no major withdrawal, did ok on ward  3. Opioid use disorder/opioid withdrawal:   - treat symptomatically and he tolerated the detox well  Agitation none, no need SRR or emergency meds.      He did get prn med of risperidal prn on daily basis during stay, at time of d/c will put him on 2mg  risperidal at bed time,   He can stay on this until the lamictyl get titrated upinto a range that works for mood control.     4. Substance Abuse/Dep/Withdrawal: see above   5. Safety:  required admission to inpatient psychiatry once medically stable from medical ward, kkept on psychiatry 5 days.  Was stabolized, now stable to  d/c home, no si/hi.  Doing better.      MSE on d/c    Neat and tidy, calm  Speech clear  languge intact   TP goal driected  He plans go AA get sponsor, stay sober, work steps and do f/u trreatments  No si/hi  No halluc  Mood ok  Affect approb  aox3  IQ ave  Attention and concentration intact  Insight good judgment fair      Plan  Meet with pt 20 min on d/c day, reviewed meds with pt, reg diet and acitvity

## 2013-12-17 NOTE — Unmapped (Signed)
As you explained, you will begin attending the [facility] Substance Abuse Outpatient Program 3x/wk.  [facility] offers walk-in same day intake apptmts at the following offices:  [address], [place], [place] - call [phone] for the office nearest you  Alcohol Intoxication  Alcohol intoxication means your blood alcohol level is above legal limits. Alcohol is a drug. It has serious side effects. These side effects can include:  ?? Damage to your organs (liver, nervous system, and blood system).  ?? Unclear thinking.  ?? Slowed reflexes.  ?? Decreased muscle coordination.  HOME CARE  ?? Do not drink and drive.  ?? Do not drink alcohol if you are taking medicine or using other drugs. Doing so can cause serious medical problems or even death.  ?? Drink enough water and fluids to keep your pee (urine) clear or pale yellow.  ?? Eat healthy foods.  ?? Only take medicine as told by your doctor.  ?? Join an alcohol support group.  GET HELP RIGHT AWAY IF:  ?? You become shaky when you stop drinking.  ?? Your thinking is unclear or you become confused.  ?? You throw up (vomit) blood. It may look bright red or like coffee grounds.  ?? You notice blood in your poop (bowel movements).  ?? You become lightheaded or pass out (faint).  MAKE SURE YOU:   ?? Understand these instructions.  ?? Will watch your condition.  ?? Will get help right away if you are not doing well or get worse.  Document Released: 05/09/2008 Document Revised: 02/13/2012 Document Reviewed: 05/10/2010  ExitCare?? Patient Information ??2014 [place], [place].

## 2013-12-17 NOTE — Unmapped (Signed)
Problem: Potential for Harm to Self or Others  Goal: Patient will not present active danger to themselves due to  patient will not present an active danger to themselves due to a psych  illness   Intervention: Assess for target symptoms & provide for safety  Discharge order written. Educated on discharge medications and instructions. Verbalized understanding of medication, need to pick them up at outside pharmacy and plan to comply with treatment. Verbalized plan to get involved in treatment at the [facility]  Denies suicidal or homicidal thoughts. All belongings returned to patient. Discharged to home with boss/friend.

## 2013-12-17 NOTE — Unmapped (Signed)
Occupational Therapy/Therapeutic Recreation Progress Note      CEDAR DITULLIO  [id number]      Group: Task  Attendance: Attended  60/60 minutes  Level of Participation: Active, Interacted with staff and Followed Directions    Pt readily got out of bed to attend group.  Pt independently gathers supplies and sets himself up to participate.  Pt enjoys listening to music  And finds it relaxing. Pts behavior is calm and cooperative.  Pt stayed the entire session.  Pt feels ready for discharge.    Thea Gist, CTRS  12/17/2013 3:30 PM

## 2013-12-17 NOTE — Unmapped (Signed)
Patient Name: Alexander [name]  Group: Dialectical Behavior Therapy: Distress Tolerance Skills - 12/17/2013  Group Overview: The purpose of the DBT Distress Tolerance Skills group is to provide patients with skills they can utilize when feeling distressed or overwhelmed by emotions in order to physiologically calm or soothe themselves by using their senses. It is the intention of the group that through providing these skills, patients can better tolerate distress in order to handle triggering events in a more effective manner.  Attendance: 60/60 minutes  Number of Sessions in Treatment: 1   Number of Participants in Group: 8  Patient Diagnosis: Mood Disorder NOS, Opiate Use Disorder  Referring Physician: Dr. Valentina Lucks  Level of Participation: Mr. Prude exhibited a low level of participation during the group. He appeared to be distracted and was not engaged in the group.   Notes: The group leader provided a brief overview of Dialectical Behavior Therapy (DBT) and its key components. This was followed by an explanation of the DBT Skills Checklist, intended for use in identifying areas for the patient where they may need to employ specific skills. Next, the group leader explained DBT Distress Tolerance and the purpose of developing skills in this area to help survive crisis moments. The patient then participated in both discussion and experiential components related to DBT Self-Soothe skills, which incorporate the five senses in managing distressing emotional moments. The patient was asked to participate in activities using the senses of vision, smell, and touch and was taught the skill of paced breathing.  The first activity involved an exercise in mindfulness through vision, using a picture of a nature scene. A second exercise involved tasting a mint and letting it melt slowly in the mouth. This was followed by passing around a few scented candles as an example of a way to appreciate the soothing qualities that a lighted  candle may have when using the sense of smell. For the final activity, the patient was asked to use lavender hand lotion and be mindful of the feel, and smell, of the lotion. The patient was also introduced to DBT TIP skills (Temperature, Intense Exercise, Progressive Muscle Relaxation), which provide ways to physiologically calm one???s body when distressed; the patient then participated in an activity in which he placed a wet/cold paper towel on the forehead for approximately 15 seconds. The group leader then explained that something very cold similar to the wet/cold paper towel on the forehead can be calming, connecting it to a physiological reaction in the body. The participants were then asked to take part in Progressive Muscle Relaxation.  At the group???s conclusion, Mr. Montagna said that he did not have anything to share regarding what benefited him the most. He expressed that he was very distracted during the group as he has ???too much going on right now??? and could not concentrate.  Lurlean Nanny, B.S.  Enda Santo A. Rhys Martini, Ph.D.

## 2013-12-17 NOTE — Unmapped (Signed)
Problem: Major Mood Disorder  Goal: Mania or depressed mood won???t interfere with daily function  Intervention: Assess for target symptoms & provide for safety  Remains focused on discharge. Social with peers. Denies suicidal thoughts.  Intervention: Dispense prescribed medication per MD order  Compliant with scheduled medication.   Intervention: Assess patient response to interventions  Cooperative with care. No complaints voiced support given.

## 2013-12-17 NOTE — Unmapped (Signed)
Writer met with patient who said he chose IOP versus residential or inpatient substance abuse treatment b/c he hopes to be able to continue earning an income.  He is aware that the [facility] office of [facility] offers Open Enrollment hours.    Patient said he learned that a relative with means has offered to pay for an inpatient treatment program, 'a really good one' and he expects said relative will visit with him before he leaves at 6pm today

## 2013-12-17 NOTE — Unmapped (Signed)
Problem: Major Mood Disorder  Goal: Mania or depressed mood won???t interfere with daily function  Alexander [name] has been able to rest in bed with eyes closed throughout the shift. Pt stated before going to his room this evening that he did not wish to be woken for CIWAs to be preformed. POD was notified. Will continue to monitor for safety and needs.  Intervention: Assess for target symptoms & provide for safety  Safety maintained.  Intervention: Dispense prescribed medication per MD order  No scheduled/PRN medications given overnight.      Problem: Potential for Harm to Self or Others  Goal: Patient will not present active danger to themselves due to  patient will not present an active danger to themselves due to a psych  illness   Pt has not shared any HI/SI and has exhibited no unsafe behaviors.

## 2013-12-31 ENCOUNTER — Inpatient Hospital Stay
Admit: 2013-12-31 | Discharge: 2013-12-31 | Disposition: A | Discharge status: Other Acute Inpatient Hospital | Payer: PRIVATE HEALTH INSURANCE

## 2013-12-31 ENCOUNTER — Inpatient Hospital Stay
Admit: 2013-12-31 | Discharge: 2013-12-31 | Disposition: A | Discharge status: Home / Self Care | Payer: PRIVATE HEALTH INSURANCE

## 2013-12-31 DIAGNOSIS — F1120 Opioid dependence, uncomplicated: Secondary | ICD-10-CM

## 2013-12-31 DIAGNOSIS — T426X1A Poisoning by other antiepileptic and sedative-hypnotic drugs, accidental (unintentional), initial encounter: Secondary | ICD-10-CM

## 2013-12-31 LAB — VENOUS BLOOD GAS, LINE/SYRINGE
%HBO2-Line Draw: 24.4 % (ref 40.0–70.0)
Base Excess-Line Draw: 6.1 mmol/L (ref <=3.0)
CO2 Content-Line Draw: 34 mmol/L (ref 25–29)
Carboxyhgb-Line Draw: 5 % (ref 0.0–2.0)
HCO3-Line Draw: 32 mmol/L (ref 24–28)
Methemoglobin-Line Draw: 0.6 % (ref 0.0–1.5)
PCO2-Line Draw: 55 mm Hg (ref 41–51)
PH-Line Draw: 7.38 (ref 7.32–7.42)
PO2-Line Draw: 15 mm Hg (ref 25–40)
Reduced Hemoglobin-Line Draw: 70 % (ref 0.0–5.0)

## 2013-12-31 LAB — HEPATIC FUNCTION PANEL
ALT: 254 U/L (ref 7–52)
AST: 131 U/L (ref 13–39)
Albumin: 4.9 g/dL (ref 3.5–5.7)
Alkaline Phosphatase: 100 U/L (ref 34–104)
Bilirubin, Direct: 0.2 mg/dL (ref 0.03–0.18)
Bilirubin, Indirect: 0.6 mg/dL (ref 0.20–0.70)
Total Bilirubin: 0.8 mg/dL (ref 0.3–1.2)
Total Protein: 7.8 g/dL (ref 6.4–8.9)

## 2013-12-31 LAB — SALICYLATE LEVEL: Salicylate Lvl: <3 mg/dL (ref 10–30)

## 2013-12-31 LAB — ACETAMINOPHEN LEVEL: Acetaminophen Level: <10 ug/mL (ref 10–30)

## 2013-12-31 LAB — BASIC METABOLIC PANEL
Anion Gap: 6 mmol/L (ref 3–16)
BUN: 14 mg/dL (ref 7–25)
CO2: 30 mmol/L (ref 21–31)
Calcium: 9.9 mg/dL (ref 8.6–10.3)
Chloride: 101 mmol/L (ref 98–110)
Creatinine: 1.01 mg/dL (ref 0.60–1.30)
GFR MDRD Af Amer: 109 See note.
GFR MDRD Non Af Amer: 90 See note.
Glucose: 90 mg/dL (ref 70–100)
Osmolality, Calculated: 284 mOsm/kg (ref 278–305)
Potassium: 4.3 mmol/L (ref 3.5–5.3)
Sodium: 137 mmol/L (ref 135–146)

## 2013-12-31 LAB — LAMOTRIGINE LEVEL: Lamotrigine Lvl: 2 ug/mL (ref 2.0–20.0)

## 2013-12-31 LAB — ETHANOL, SERUM: Ethanol: <10 mg/dL (ref 0–10)

## 2013-12-31 NOTE — Unmapped (Signed)
Pitcher of water given to patient

## 2013-12-31 NOTE — Unmapped (Signed)
Pt attempted suicide last night by taking tylenol and neurontin last night at 2000

## 2013-12-31 NOTE — Unmapped (Signed)
28 year old white male presents to [facility] OTA via Mobile care from [facility].  He is here for evaluation post overdose attempt.  Reports stressors are relapsing in alcohol and heroin.  Reports he has not been working recently and this causes him a huge amount of stress.  He is tearful during the interview.  States he makes impulsive choices and then he is so ashamed of himself.  States if it were not for his mother he would just as soon be dead.  States he is tires of the thoughts he has in his head.  He states he had two brothers who committed suicide.  One by overdose and one by shooting self.  Reports mother and sister live in [place].  Reports parents were drug addicted while he was growing up.  States he needs drug and alcohol treatment program.  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  He was searched and belongings were inventoried and locked up for safekeeping.

## 2013-12-31 NOTE — Unmapped (Signed)
Patient resting quietly in bed with eyes open.  Patient with no new complaints at this time.  Respirations easy and unlabored.  Skin warm and dry.  No acute distress noted.  Will continue to monitor.  Patient updated on plan of care and informed of any delays.  Call light is in reach and bed rails up for safety.  No concerns voiced at this time. The patient was advised to use call light if any concerns or questions.

## 2013-12-31 NOTE — Unmapped (Signed)
Alexander [name], [facility] notified. Pt on a HOLD PTA

## 2013-12-31 NOTE — Unmapped (Signed)
Psych Social Work Brief Note      Presentation: This pt is a 28 year old caucasian male with a long hx of heroin use and multiple suicide attempts who presents this morning after reportedly ingesting an unknown amount of Tylenol and Nuerontin as he reports that he no longer wants to live.  This pt reports that he became afraid and made himself throw up and then walked to the fire house.  The pt has said several different things and it is unclear if he was trying to save himself or not.  The pt then stated he was trying to not wake up.  The pt reported that both his brothers killed themselves and that he could too.  He then said  It would hurt my mom.  The pt states he is unable to stop using alcohol and heroin and feels desperate.        Current Mental Status: The pt is alert, calm, seems comfortable in his room.  The pt is eating a sandwich and is requesting help with getting into tx.       Substance Use: the pt has a long hx of heroin use.  The pt has been unsuccessful on his own quitting. The pt reports he is interested in tx but is not sure how to get into tx.  The pt has [facility] but states he is worried about the cost.        History of Mental Health Treatment: The pt has multiple visits to pes, multiple admissions. Hx of overdoses.  The pt reports that he is not currently connected to mental health services in the community.      Collateral: This pt's mother Phyliss Hoban [phone]) states she is very worried about him and is afraid that he will kill himself as her other 2 sons did.        Formulation of Plan: This pt is on a statement of belief and will transfer to pes.        Patient Reaction to Plan: The pt is agreeable      Handoff of Communication: This psw spoke with SW [employer]  Plan: This pt will transfer via [facility]

## 2013-12-31 NOTE — Unmapped (Signed)
Patient given belongings, discharge instructions, items from safe and was discharged from OTA.

## 2013-12-31 NOTE — Unmapped (Signed)
Patient in interview room with the doctor. Safety checks continue.

## 2013-12-31 NOTE — Unmapped (Signed)
[place] ED Note    Date of Service: 12/31/2013    Reason for Visit: Ingestion      Patient History     HPI:  Alexander [name] is a 28 y.o. male with a past medical history significant for drug abuse and depression who presents to the emergency department with a chief complaint of intentional overdose.  Patient states that he relapsed using heroin yesterday afternoon as well as alcohol.  He states that this made him angry at himself and he therefore comes to kill himself by taking both Neurontin and Tylenol.  The patient states he took approximately number 20 Neurontin tablets which were between 150 and 300 mg.  He also reports taking a number 10 500 mg Tylenol tablets.  He reports his ingestion was between 7 and 8 PM last night.  He does complain of some bilateral lower back pain this morning but no abdominal pain.  He did have some vomiting early this morning denies any nausea at this time.  No fevers or chills.  No dysuria or hematuria.  No saddle anesthesia.  No lower extremity numbness or weakness.  No difficulty urinating or urinary incontinence.    Past Medical History:  Past Medical History   Diagnosis Date   ??? Depression    ??? Drug abuse and dependence    ??? H/O tooth extraction    ??? Thyroid disease    ??? Substance induced mood disorder    ??? PTSD (post-traumatic stress disorder)    ??? Anxiety    ??? Bipolar disorder    ??? Bronchitis    ??? Liver disease      Patient unaware of actual dx       Past Surgical History:  Past Surgical History   Procedure Laterality Date   ??? Mandible surgery         Social History:  LYNKIN [name]  reports that he has been smoking Cigarettes.  He has a 22 pack-year smoking history. He does not have any smokeless tobacco history on file. He reports that he drinks about 7.2 ounces of alcohol per week. He reports that he uses illicit drugs (Heroin, Other-see comments, and Marijuana).    Home  Medications:  Previous Medications    LAMOTRIGINE (LAMICTAL) 25 MG TABLET    Take 1 tablet (25 mg total) by mouth daily.    RISPERIDONE (RISPERDAL) 2 MG TABLET    Take 1 tablet (2 mg total) by mouth At bedtime.       Allergies:   Allergies as of 12/31/2013   ??? (No Known Allergies)       PCP: No Pcp    All nursing notes and triage notes were reviewed in the course of the creation of this note.     Review of Systems     A complete 10 system review of systems was accomplished and was negative except for that stated above in the HPI.    Physical Exam     ED Triage Vitals   Vital Signs Group      Temp 12/31/13 0934 98.1 ??F (36.7 ??C)      Temp Source 12/31/13 0934 Oral      Heart Rate 12/31/13 0934 71      Heart Rate Source 12/31/13 0934 Automatic      Resp 12/31/13 0934 16      BP 12/31/13 0934 149/90 mmHg      BP Location 12/31/13 0934 Left arm  BP Method 12/31/13 0934 Automatic      Patient Position 12/31/13 0934 Sitting   SpO2 12/31/13 0934 98 %   O2 Device 12/31/13 0934 None (Room air)     General:  Well developed Caucasian male who is in no acute distress, able to converse in full sentences  HEENT:  Normocephalic, atraumatic, PERRL, extra-ocular movements intact, oropharynx benign, no scleral icterus  Neck:  Supple, no lymphadenopathy, trachea midline, no masses  Pulmonary:   Clear to auscultation bilaterally, good air movement, no wheezes/ronchi/rales  Cardiac:  Regular rate and rhythm, no M/R/G  Abdomen:  Soft, non-tender, non-distended, no rebounding or guarding, normoactive borborygmus  Musculoskeletal:  2+ pulses, no edema or clubbing  Skin:  No concerning rashes or lesions, no cyanosis  Neuro: Alert and oriented X 4, able to move all four extremities with equal strength bilaterally, sensory exam grossly intact, cranial nerves II-XII intact. No saddle anesthesia.   Psych:  Mood and affect are appropriate and concordant. No current SI. No V/A hallucinations.    Diagnostic Studies     Labs:    Please see  electronic medical record for any tests performed in the ED     EKG:  12/31/2013  Indication: ingestion  Rate: 74 bpm  Rhythm: Sinus rhythm  Axis: Normal axis  Intervals: Normal PR, QRS and QT/QTc intervals  ST Segment Change: No acute ST segment changes  Comparison to prior EKG: When compared to EKG dated December 06, 2013 there is no acute change  Interpretation: Sinus rhythm with no evidence of acute change    ED Course and Medical-Decision-Making     LAURI TILL was seen in the Emergency Department for evaluation of his chief complaint as described in the history of present illness.  A focused history and physical were performed by myself in collaboration with the Attending Physician. All care plans were discussed and agreed upon.    Patient presents after an intentional ingestion of the Neurontin and Tylenol and an attempt to take his life.  The full ingestion workup including metabolic panel, Tylenol/salicylate levels and EKG was obtained.  The patient is hemodynamically stable with no neurologic changes on my exam.  He does complain of some mild lower back pain and has a history of heroin use, however is afebrile, has no lower extremity numbness weakness, saddle anesthesia and no urinary symptoms and I did not feel therefore that his presentation was consistent with spinal epidural abscess.    Renal panel revealed no evidence of acute metabolic abnormalities.  He does have a mild transaminitis which is unchanged from prior labs and likely due to his alcohol use.  He had a nondetectable Tylenol level and is much greater than 4 hours from ingestion.  Urine drug screen is pending.    Given an intentional attempt to take his own life, the patient did have a psychiatric hold was signed on him by police prior to arrival.  I spoke with Dr. Valentina Lucks at the [facility] psychiatric emergency services who has agreed to accept the patient in transfer.  The risks and benefits of transfer were explained to the patient and  he voiced understanding.    Consults:  Psychiatric SW    Clinical Impression     1. Overdose    2. Suicide attempt by substance overdose        Plan   1. Transfer to [facility]        B. Cyndie Mull, MD  Resident  12/31/13 [phone]

## 2013-12-31 NOTE — Unmapped (Signed)
Pt states he drank a 12 pack of beer last night, snorted heroine, and took 20 neurontin, and 10 tylenol around 8pm last night. Pt complains of flank pain this morning.

## 2013-12-31 NOTE — Unmapped (Signed)
Blood drawn per protocol and sent to lab.

## 2013-12-31 NOTE — Unmapped (Signed)
Sitter remains at bedside. Waiting for mobile care pick up. Will cont to monitor

## 2013-12-31 NOTE — Unmapped (Signed)
[facility] Psychiatric Emergency  Service Evaluation         Reason for Visit/Chief Complaint: Suicidal      Patient History     Context: stress  Location: Altered mental status of mood  Duration: one days.  Severity: mild .  Associated Symptoms: mild .  Modifying Factors: other:     Past Psychiatric History: He has been here 2 times on the psychiatric unit.  On one occasion, he made a serious attempt.  He is denying that he is suicidal at this time.    HPI  This is a 28 year-old single white male who comes in stating that he took an overdose. He states that this happened last night and he threw up.  He then went to bed and he then came here this morning stating that he did not feel good.  We could find no evidence that he actually overdosed.      Patient was seen in Telemedicine because he intially told the social worker that he was ok.  He then told me that he was suicidal to me.  He came here and after he talked to our Child psychotherapist here, he stated that he ok and he would work harder to get into a program.      Social History:  He is living with his girlfriend.  He states that there are issues at this time but they are still living together.  He feels that after he gets back to work, he will be ok.      Family History   Problem Relation Age of Onset   ??? Drug abuse Brother    ??? Hypertension Mother    ??? Transient ischemic attack Mother    ??? Seizures Father    ??? Hypertension Father    ??? Cancer Father        Work History:  Laid off at this time.  Will go back to work on March 1.      History     Social History   ??? Marital Status: Single     Spouse Name: N/A     Number of Children: N/A   ??? Years of Education: N/A     Social History Main Topics   ??? Smoking status: Current Every Day Smoker -- 2.00 packs/day for 11 years     Types: Cigarettes   ??? Smokeless tobacco: None   ??? Alcohol Use: 7.2 oz/week     12 Cans of beer per week      Comment: last drank 2 days ago   ??? Drug Use: Yes     Special: Heroin, Other-see comments,  Marijuana      Comment: most recently using heroin   ??? Sexual Activity: Yes     Partners: Female, Male     Birth Control/ Protection: Condom, Other-see comments     Other Topics Concern   ??? Caffeine Use Yes   ??? Occupational Exposure No   ??? Exercise No   ??? Seat Belt Yes     Social History Narrative   ??? None          Past Medical History   Diagnosis Date   ??? Depression    ??? Drug abuse and dependence    ??? H/O tooth extraction    ??? Thyroid disease    ??? Substance induced mood disorder    ??? PTSD (post-traumatic stress disorder)    ??? Anxiety    ??? Bipolar disorder    ???  Bronchitis    ??? Liver disease      Patient unaware of actual dx       Previous Medications    LAMOTRIGINE (LAMICTAL) 25 MG TABLET    Take 1 tablet (25 mg total) by mouth daily.    RISPERIDONE (RISPERDAL) 2 MG TABLET    Take 1 tablet (2 mg total) by mouth At bedtime.       Allergies:   Allergies as of 12/31/2013   ??? (No Known Allergies)       Review of Systems     Review of Systems   Constitutional: Negative for activity change.   HENT: Negative for facial swelling.    Eyes: Negative for discharge.   Respiratory: Negative for apnea.    Cardiovascular: Negative for chest pain.   Gastrointestinal: Negative for abdominal distention.   Genitourinary: Negative for difficulty urinating.   Musculoskeletal: Negative for arthralgias.   Skin: Negative for color change.   Neurological: Negative for dizziness.   Hematological: Negative for adenopathy.   Psychiatric/Behavioral: Negative for agitation.         Physical Exam/Objective Data     ED Triage Vitals   Vital Signs Group      Temp 12/31/13 1349 98.3 ??F (36.8 ??C)      Temp Source 12/31/13 1349 Oral      Heart Rate 12/31/13 1349 80      Heart Rate Source 12/31/13 1349 Automatic      Resp 12/31/13 1349 16      BP 12/31/13 1349 117/69 mmHg      BP Location 12/31/13 1349 Right arm      BP Method 12/31/13 1349 Automatic      Patient Position 12/31/13 1349 Sitting   SpO2 12/31/13 1349 96 %   O2 Device 12/31/13 1349 None  (Room air)       Physical Exam    Mental Status Exam:     Gait and Muscle Strength:  Normal  Appearance and Behavior: Calm and Cooperative      Groomed  Speech: NL articulation, prosody, volume and production  Mood: euthymic  Affect: appropriate  Thought Process and Associations: goal directed       No loose associations  Thought Content: denies that he is suicidal at this time.  Perceptual Abnormalities: None  Orientation: person, place, time/date and situation  Memory/Attention: recent, remote, and immediate recall intact  Abstraction: Attention and concentration intact  Fund of Knowledge: average  Insight and Judgement: Partial     Fair      Labs:    Please see electronic medical record for any tests performed in the ED.      Radiology:    Please see electronic medical record for any studies performed in the ED.    Emergency Course and Plan     SAHEED CARRINGTON is a 28 y.o. male who presented to the emergency department with Suicidal       Patient is now denying that he is suicidal.  I recognize that there are risk factors here.  His brothers were substance abusers and did commit suicide.  We did note that he availed himself very little of help while here both times.  He seemed to enjoy himself in the hospital and accomplished very little.  He is now denying that he would harm himself.  He states that he would not kill himself because it would very much upset his mother.      Diagnosis:    Axis  I: Opiate dependence  Axis II: Deferred     Axis III: Hep C    Axis IV: ongoing use    Axis V: 51-60 moderate symptoms      Disposition:      Discharged from the ED. See AVS for prescriptions, followup, and discharge instructions.     @SWRPT @        Philis Kendall, MD  12/31/13 [phone]

## 2013-12-31 NOTE — Unmapped (Signed)
Was given recliner to rest in.  Was given food and fluid.  Will monitor for safety and provide support as needed.

## 2013-12-31 NOTE — Unmapped (Signed)
Met with pt. Pt presents bright, euthymic, jovial and engaging. Pt reports he was hoping to be able to stay here so that he could get into some kind of inpatient treatment. Discussed with pt that we have admitted him in the past to try and assist him with that. Social worker asked pt what the outcome of that admission was. Pt reports that he met with the chemical dependence specialist Ed Blalock and he reports he got him connected to outpatient treatment through [facility]. He reports he has to see the doctor before he can get into outpatient treatment. He reports he is scheduled to see the doctor in the middle of February to get into the program. Pt stated he wanted to get into inpatient treatment. Asked pt if he had discussed that option with the Chemical Dependence Specialist. Pt stated they had discussed it and no one takes his [employer]. He reports in order to get into [facility] he has to have an [place] ID. He reports his ID is currently from another state. He reports the only thing holding him back from getting an ID is he needs $10. It appears pt is aware of what he needs to do to get services. Pt is very intelligent and has insight into his lack of being proactive in seeking treatment. Discussed pt attending AA/NA until he could get into more extensive services. Pt was open to that.     Social worker got list of AA/NA meetings in [place] together for pt. Pt plans to follow up with [facility] and seek treatment at [facility] and see what he can get first.     Child psychotherapist discussed with pt sober living housing that may be helpful.  ([facility]).

## 2013-12-31 NOTE — Unmapped (Signed)
Report called to Erskine Squibb, RN in [facility].

## 2013-12-31 NOTE — Unmapped (Signed)
ED Attending Attestation Note    Date of service:  12/31/2013    This patient was seen by the resident physician.  I have seen and examined the patient, agree with the workup, evaluation, management and diagnosis. The care plan has been discussed and I concur.  I have reviewed the ECG and concur with the resident's interpretation.    My assessment reveals a 28 y.o. male with polysubstance ingestion approximately 14 hours ago. Will check APAP level. Otherwise stable and A&O.

## 2013-12-31 NOTE — Unmapped (Addendum)
Patient presents to the Emergency Department with report of overdose on Tylenol and Neurontin.  Patient reports he used heroin and alcohol yesterday also.  Patient reports has been feeling very stressed and depressed.  Patient reports was seen last week for same and was admitted to [facility].  Patient is anxious, but easily calmed.  Patient reports was put on Lamictal and has been taking it as prescribed, but the stress is just getting to him.  Patient denies headache, blurred or double vision.  Patient denies chest pain, SOB, or trouble breathing.  Patient denied abdominal pain, but reports nausea and vomiting.  Patient complains of kidney pain lower back pain at this time.  (-) CVA tenderness noted.  Patient denies painful urination, urinary frequency or urgency.  Patient placed on cardiac monitor and continuous pulse oximetry.  NSR noted, no ectopy.  Call light within reach and patient advised to use call light for any concerns or questions.  Will continue to monitor.

## 2013-12-31 NOTE — Unmapped (Signed)
Overdose, Adult  A person can overdose on alcohol, drugs or both by accident or on purpose. If it was on purpose, it is a serious matter. Professional help should be sought. If the overdose was an accident, certain steps should be taken to make sure that it never happens again.  ACCIDENTAL OVERDOSE  Overdosing on prescription medications can be a result of:  ?? Not understanding the instructions.  ?? Misreading the label.  ?? Forgetting that you took a dose and then taking another by mistake. This situation happens a lot.  To make sure this does not happen again:  ?? Clarify the correct dosage with your caregiver.  ?? Place the correct dosage in a pill-minder container (labeled for each day and time of day).  ?? Have someone dispense your medicine.  Please be sure to follow-up with your primary care doctor as directed.  INTENTIONAL OVERDOSE  If the overdose was on purpose, it is a serious situation. Taking more than the prescribed amount of medications (including taking someone else's prescription), abusing street drugs or drinking an amount of alcohol that requires medical treatment can show a variety of possible problems. These may indicate you:  ?? Are depressed or suicidal.  ?? Are abusing drugs, took too much or combined different drugs to experiment with the effects.  ?? Mixed alcohol with drugs and did not realize the danger of doing so (this is drug abuse).  ?? Are suffering addiction to drugs and/or alcohol (also known as chemical dependency).  ?? Binge drink.  If you have not been referred to a mental health professional for help, it is important that you get help right away. Only a professional can determine which problems may exist and what the best course of treatment may be. It is your responsibility to follow-up with further evaluation or treatment as directed.   Alcohol is responsible for a large number of overdoses and unintended deaths among college-age young adults. Binge drinking is consuming 4-5 drinks  in a short period of time. The amount of alcohol in standard servings of wine (5 oz.), beer (12 oz.) and distilled spirits (1.5 oz., 80 proof) is the same. Beer or wine can be just as dangerous to the binge drinker as hard liquor can be.   CONSEQUENCES OF BINGE DRINKING  Alcohol poisoning is the most serious consequence of binge drinking. This is a severe and potentially fatal physical reaction to an alcohol overdose. When too much alcohol is consumed, the brain does not get enough oxygen. The lack of oxygen will eventually cause the brain to shut down the voluntary functions that regulate breathing and heart rate. Symptoms of alcohol poisoning include:  ?? Vomiting.  ?? Passing out (unconsciousness).  ?? Cold, clammy, pale or bluish skin.  ?? Slow or irregular breathing.  WHAT SHOULD I DO NEXT?  If you have a history of drug abuse or suffer chemical dependency (alcoholism, drug addiction or both), you might consider the following:  ?? Talk with a qualified substance abuse counselor and consider entering a treatment program.  ?? Go to a detox facility if necessary.  ?? If you were attending self-help group meetings, consider returning to them and go often.  ?? Explore other resources located near you (see sources listed below).  If you are unsure if you have a substance abuse problem, ask yourself the following questions:  ?? Have you been told by friends or family that drugs/alcohol has become a problem?  ?? Do you get into fights   when drinking or using drugs?  ?? Do you have blackouts (not remembering what you do while using)?  ?? Do you lie about use or amounts of drugs or alcohol you consume?  ?? Do you need chemicals to get you going?  ?? Do you suffer in work or school performance because of drug or alcohol use?  ?? Do you get sick from drug or alcohol use but continue to use anyway?  ?? Do you need drugs or alcohol to relate to people or feel comfortable in social situations?  ?? Do you use drugs or alcohol to forget  problems?  If you answered Yes to any of the above questions, it means you show signs of chemical dependency and a professional evaluation is suggested. The longer the use of drugs and alcohol continues, the problems will become greater.  SEEK IMMEDIATE MEDICAL CARE IF:   ?? You feel like you might repeat your problematic behavior.  ?? You need someone to talk to and feel that it should not wait.  ?? You feel you are a danger to yourself or someone else.  ?? You feel like you are having a new reaction to medications you are taking, or you are getting worse after leaving a care center.  ?? You have an overwhelming urge to drink or use drugs.  Addiction cannot be cured, but it can be treated successfully. Treatment centers are listed in the yellow pages under: Cocaine, Narcotics, and Alcoholics Anonymous. Most hospitals and clinics can refer you to a specialized care center. The [place] government maintains a toll-free number for obtaining treatment referrals: 1-800-662-4357 or 1-800-487-4889 (TDD) and maintains a website: http://findtreatment.samhsa.gov. Other websites for additional information are: www.mentalhealth.samhsa.gov. and www.nida.gov.  In [place] treatment resources are listed in each Province with listings available under [employer] or similar titles.  Document Released: 11/24/2003 Document Revised: 02/13/2012 Document Reviewed: 10/15/2008  ExitCare?? Patient Information ??2014 [employer].

## 2014-01-15 ENCOUNTER — Ambulatory Visit: Payer: PRIVATE HEALTH INSURANCE

## 2014-05-06 ENCOUNTER — Inpatient Hospital Stay
Admit: 2014-05-06 | Discharge: 2014-05-06 | Disposition: A | Discharge status: Home / Self Care | Payer: PRIVATE HEALTH INSURANCE

## 2014-05-06 ENCOUNTER — Emergency Department: Admit: 2014-05-06 | Payer: PRIVATE HEALTH INSURANCE

## 2014-05-06 DIAGNOSIS — N63 Unspecified lump in unspecified breast: Secondary | ICD-10-CM

## 2014-05-06 MED ORDER — ibuprofen (ADVIL,MOTRIN) 800 MG tablet
800 | ORAL_TABLET | Freq: Three times a day (TID) | ORAL | Status: AC | PRN
Start: 2014-05-06 — End: 2014-07-03

## 2014-05-06 NOTE — Unmapped (Signed)
[facility] ED Note    Date of service:  05/06/2014    Reason for Visit: Breast Pain      Patient History     HPI  Patient is a 28 year old Caucasian male presents to the emergency department today with worsening pain in his breast.  Patient states that over the past several months to years he has had several masses which have increased in size in his left breast.  These masses or symptoms tender to palpation.  They have been slowly increasing in size.  Patient states that he has no drainage from his nipple, no skin changes.  The pain is intermittent and with palpation at its worst is a 5/10.  Patient smokes, has a significant history of cancer on his father's side.  Patient is requesting followup in a breast clinic.  Pt denies any other associated signs or symptoms and any other aggravating or alleviating factors.          PMH: Nursing notes reviewed  PSH: Nursing notes reviewed  FH: Nursing notes reviewed  MEDS: Nursing notes and chart reviewed  ALLERGIES: Nursing notes and chart reviewed                 Past Medical History   Diagnosis Date   ??? Depression    ??? Drug abuse and dependence    ??? H/O tooth extraction    ??? Thyroid disease    ??? Substance induced mood disorder    ??? PTSD (post-traumatic stress disorder)    ??? Anxiety    ??? Bipolar disorder    ??? Bronchitis    ??? Liver disease      Patient unaware of actual dx       Past Surgical History   Procedure Laterality Date   ??? Mandible surgery         Patient  reports that he has been smoking Cigarettes.  He has a 22 pack-year smoking history. He does not have any smokeless tobacco history on file. He reports that he drinks about 7.2 ounces of alcohol per week. He reports that he uses illicit drugs (Heroin, Other-see comments, and Marijuana).      Previous Medications    LAMOTRIGINE (LAMICTAL) 25 MG TABLET    Take 1 tablet (25 mg total) by mouth daily.    RISPERIDONE (RISPERDAL) 2 MG TABLET    Take 1 tablet (2 mg total)  by mouth At bedtime.       Allergies:   Allergies as of 05/06/2014   ??? (No Known Allergies)       Review of Systems     Review of Systems  Pertinent positives and negatives included above; all other systems reviewed completely and negative.        Physical Exam     ED Triage Vitals   Vital Signs Group      Temp --       Temp src --       Pulse --       Heart Rate Source --       Resp --       SpO2 --       BP --       BP Location --       BP Method --       Patient Position --    SpO2 --    O2 Device --        ED Physical Exam  General:  Appears stated age,  well-nourished, no acute distress  HEENT:  Head normocephalic and atraumatic. Eyes clear PERRL. MMM  Neck:  Trachea midline  Pulmonary:   Breathing unlabored  Cardiac: Extrem WWP.   Abdomen:  Soft NTND No rebound, guarding or masses  Musculoskeletal:  MAE x 4  Vascular: No pedal edema.  Skin:  No visible rashes, abrasions, lacerations, patient has no Peau-d-orange, no other skin changes, patient has no drainage from his nipple with palpation.  Patient has one half centimeter circular nodular mass palpated to the left of the sternal border, patient has another half centimeter nodular mass palpated underneath his left nipple, these are minimally tender to palpation, there is no differential size of either breast, there is no adenopathy appreciated  Neuro:  Face symmetric, no dysarthria.   Psych:  A&Ox4. Appropriate mood and affect.      Diagnostic Studies     Labs:    Please see electronic medical record for any tests performed in the ED    Radiology:    Please see electronic medical record for any tests performed in the ED    EKG:    No EKG Performed    Emergency Department Procedures     Procedures    ED Course and MDM     Alexander [name] is a 28 y.o. male who presented to the emergency department with Breast Pain    Patient was seen and evaluated by both myself and the attending. A complete history and physical exam was obtained.  Pt was received in no acute  distress.    Chest x-ray is clear no signs of any acute abnormalities.  Patient has 2 nodular masses appreciated in his left breast.  Patient was given a referral to the breast clinic, patient was given followup instructions to call the peds clinic for followup.  Patient has insurance, patient instructed to call his insurance for primary care followup in addition to call in the breast clinic.    Patient otherwise stable vital signs are stable.  There is no signs of any overt fungating mass in the left breast.  DC home  Mass  Follow-up breast clinic  My customary discharge and return instructions were communicated to the patient       Critical Care Time (Attendings)           Beverely Low, MD  Resident  05/06/14 [phone]

## 2014-05-06 NOTE — Unmapped (Signed)
Pt noticed raised knot area on left breast that are painful. More painful as time has went on. Knots are moveable. Denies discharge or redness. Denies fever. C/o N/V for few months now in AM.

## 2014-05-06 NOTE — Unmapped (Signed)
Patient presents with three knots in his left breast for a couple months.

## 2014-05-06 NOTE — Unmapped (Signed)
ED Attending Attestation Note    Date of service:  05/06/2014    This patient was seen by the resident physician.  I have seen and examined the patient, agree with the workup, evaluation, management and diagnosis. The care plan has been discussed and I concur.     My assessment reveals a 28 y.o. male with soft tissue nodules over his anterolateral thorax in his left breast.  He describes a strong family history of malignancies which is to get this evaluated.  On physical exam, chest is clear.

## 2014-05-15 ENCOUNTER — Ambulatory Visit: Admit: 2014-05-15 | Discharge: 2014-05-15 | Payer: PRIVATE HEALTH INSURANCE | Attending: Oncology

## 2014-05-15 DIAGNOSIS — N62 Hypertrophy of breast: Secondary | ICD-10-CM

## 2014-05-15 NOTE — Unmapped (Signed)
If you have any questions and/or concerns please feel free to contact our office for assistance.     Hours of Operation  Monday through Friday  8 am to 4:30 pm    [name] Weber  Clinical Services Coordinator  Gail Johnson, APN  [phone]

## 2014-05-15 NOTE — Unmapped (Signed)
History of Present Illness:   Alexander [name] is a 28 y.o. male    HPI   This is a young man referred through the [facility] for evaluation of painful left breast masses.  The patient gives a 29-month history of a painful mass at the lower inner quadrant of the left breast initially and then under the nipple and the lateral left breast.  This is associated with some asymmetry of the breast in that the left breast as larger than the right breast.  The patient denies any prior problems with his breasts.  He believes that his father died of complications associated with untreated breast cancer.  He has no other family history of malignancy.  The patient denies any current use or abuse of marijuana, recreational drugs or ETOH abuse.  He offers that he was recently diagnosed with hepatitis C.         Histories:     He has a past medical history of Depression; Drug abuse and dependence; H/O tooth extraction; Thyroid disease; Substance induced mood disorder; PTSD (post-traumatic stress disorder); Anxiety; Bipolar disorder; Bronchitis; and Liver disease.    He has past surgical history that includes Mandible surgery.    His family history includes Cancer in his father; Drug abuse in his brother; Hypertension in his father and mother; Seizures in his father; Transient ischemic attack in his mother.    He reports that he has been smoking Cigarettes.  He has a 22 pack-year smoking history. He does not have any smokeless tobacco history on file. He reports that he drinks about 7.2 ounces of alcohol per week. He reports that he does not use illicit drugs.      Review of Systems   Constitutional: Negative for fever and chills.   HENT: Positive for trouble swallowing. Negative for sore throat.         Intermittent swallowing difficulty.    Eyes: Negative for photophobia and visual disturbance.   Respiratory: Negative for cough and shortness of breath.    Cardiovascular: Negative for chest pain and leg swelling.   Gastrointestinal:  Negative for abdominal pain and blood in stool.   Genitourinary: Negative for hematuria and difficulty urinating.   Musculoskeletal: Negative for joint swelling and myalgias.   Skin: Negative for color change and pallor.   Neurological: Negative for dizziness and headaches.   Hematological: Positive for adenopathy. Does not bruise/bleed easily.        Intermittent swollen glands and lymph nodes left neck.    Psychiatric/Behavioral: Negative for depression and dysphoric mood.       Allergies:   Review of patient's allergies indicates no known allergies.    Medications:     Outpatient Encounter Prescriptions as of 05/15/2014   Medication Sig Dispense Refill   ??? ibuprofen (ADVIL,MOTRIN) 800 MG tablet Take 1 tablet (800 mg total) by mouth every 8 hours as needed for Pain.  30 tablet  0   ??? lamoTRIgine (LAMICTAL) 25 MG tablet Take 1 tablet (25 mg total) by mouth daily.  30 tablet  1   ??? risperiDONE (RISPERDAL) 2 MG tablet Take 1 tablet (2 mg total) by mouth At bedtime.  30 tablet  1     No facility-administered encounter medications on file as of 05/15/2014.        Objective:       Blood pressure 131/86, pulse 74, temperature 98.4 ??F (36.9 ??C), temperature source Oral, resp. rate 16, height 5' 8.5 (1.74 m), weight 155 lb (70.308  kg), SpO2 98.00%.    Physical Exam   Vitals reviewed.  Constitutional: He is oriented to person, place, and time. Vital signs are normal. He appears well-developed and well-nourished.   HENT:   Head: Normocephalic and atraumatic.   Right Ear: External ear normal.   Left Ear: External ear normal.   Eyes: Conjunctivae and EOM are normal. No scleral icterus.   Neck: Normal range of motion. Neck supple.   Cardiovascular: Normal rate, regular rhythm and normal heart sounds.    Pulmonary/Chest: Effort normal and breath sounds normal. No respiratory distress. He has no wheezes. Right breast exhibits no inverted nipple, no mass, no nipple discharge, no skin change and no tenderness. Left breast exhibits  mass. Left breast exhibits no inverted nipple, no nipple discharge, no skin change and no tenderness. Breasts are symmetrical.       There is bilateral asymmetry in size with the left breast being slightly larger than the right.  There is retroareolar thickening of the tissue at the left breast. There is associated palpable tenderness.There is a small mobile nodularity at the 7:00 position  just inferior to the left breast, left of the sternum.      Abdominal: Soft. Bowel sounds are normal.   Musculoskeletal: Normal range of motion. He exhibits no edema and no tenderness.   Lymphadenopathy:        Head (right side): No preauricular and no posterior auricular adenopathy present.        Head (left side): No preauricular and no posterior auricular adenopathy present.     He has no cervical adenopathy.        Right cervical: No superficial cervical adenopathy present.       Left cervical: No superficial cervical adenopathy present.     He has no axillary adenopathy.        Right axillary: No pectoral and no lateral adenopathy present.        Left axillary: No pectoral and no lateral adenopathy present.       Right: No supraclavicular adenopathy present.        Left: No supraclavicular adenopathy present.   Neurological: He is alert and oriented to person, place, and time. Coordination normal.   Skin: Skin is warm and dry. No rash noted. No erythema.   Psychiatric: He has a normal mood and affect. His behavior is normal.       Review of Lab Results:      Lab Results   Component Value Date    WBC 7.5 12/12/2013    HGB 14.4 12/12/2013    HCT 41.1 12/12/2013    PLT 178 12/12/2013    GLUCOSE 90 12/31/2013    CREATININE 1.01 12/31/2013    NA 137 12/31/2013    K 4.3 12/31/2013    CL 101 12/31/2013    CO2 30 12/31/2013    BILITOT 0.8 12/31/2013    PROT 7.8 12/31/2013    AST 131* 12/31/2013    ALT 254* 12/31/2013    ALKPHOS 100 12/31/2013       Imaging:       Pathology:       Cancer Staging:   No matching staging information was found for the  patient.       Assessment:   28 y.o. male with recent onset assymetry of the left breast and palpable tenderness at the retroareolar left breast c/w unilateral gynecomastia.  Risk factors include recent diagnosis of hepatitis C.  Pt educated on the etiology, diagnostic  workup and various treatments for gynecomastia.  The patient gives a vague family history of breast cancer, that it not certain, but described as his father having knots in his chest.  The patient has undergone recent chest x-ray that is negative.  Will likely     Plan:   1.  Diagnostic breast imaging  2.  Follow up after imaging.

## 2014-05-29 ENCOUNTER — Ambulatory Visit: Admit: 2014-05-29 | Discharge: 2014-05-29 | Payer: PRIVATE HEALTH INSURANCE | Attending: Oncology

## 2014-05-29 ENCOUNTER — Inpatient Hospital Stay: Admit: 2014-05-29 | Payer: PRIVATE HEALTH INSURANCE | Attending: Oncology

## 2014-05-29 DIAGNOSIS — N6489 Other specified disorders of breast: Secondary | ICD-10-CM

## 2014-05-29 DIAGNOSIS — Z8619 Personal history of other infectious and parasitic diseases: Secondary | ICD-10-CM

## 2014-05-29 NOTE — Unmapped (Signed)
If you have any questions and/or concerns please feel free to contact our office for assistance.     Hours of Operation  Monday through Friday  8 am to 4:30 pm    [name] Weber  Clinical Services Coordinator  Gail Johnson, APN  [phone]

## 2014-05-29 NOTE — Unmapped (Signed)
History of Present Illness:   JEREMIA GROOT is a 28 y.o. male    HPI   This is a patient who was seen originally for concerns of left breast mass.  On initial consultation visit,  exam was consistent with unilateral gynecomastia, of left breast.  The patient was sent for breast imaging that demonstrated mild bilateral gynecomastia, left greater than right.  The patient has a significant past medical history of psychiatric illness and substance abuse.  Most recently, the patient was diagnosed with hepatitis C. He reports that he is in a program for his history of substance abuse through the [facility].  On referral from the ED for evaluation of left breast mass, the patient was also referred for medical care to the ambulatory internal medicine clinic.  He reports that he did not follow through with this referral appt.  He verbalizes that he has not been taking very good care of himself.   On review of the patient's most recent lab work,  he has also tested + for Hep B surface antigen and has elevated liver enzymes as well.          Histories:     He has a past medical history of Depression; Drug abuse and dependence; H/O tooth extraction; Thyroid disease; Substance induced mood disorder; PTSD (post-traumatic stress disorder); Anxiety; Bipolar disorder; Bronchitis; and Liver disease.    He has past surgical history that includes Mandible surgery.    His family history includes Breast cancer in his father; Cancer in his father; Drug abuse in his brother; Hypertension in his father and mother; Seizures in his father; Transient ischemic attack in his mother.    He reports that he has been smoking Cigarettes.  He has a 22 pack-year smoking history. He does not have any smokeless tobacco history on file. He reports that he drinks about 7.2 ounces of alcohol per week. He reports that he does not use illicit drugs.      Review of Systems    Allergies:   Review of patient's allergies indicates no known  allergies.    Medications:     Outpatient Encounter Prescriptions as of 05/29/2014   Medication Sig Dispense Refill   ??? ibuprofen (ADVIL,MOTRIN) 800 MG tablet Take 1 tablet (800 mg total) by mouth every 8 hours as needed for Pain.  30 tablet  0   ??? lamoTRIgine (LAMICTAL) 25 MG tablet Take 1 tablet (25 mg total) by mouth daily.  30 tablet  1   ??? risperiDONE (RISPERDAL) 2 MG tablet Take 1 tablet (2 mg total) by mouth At bedtime.  30 tablet  1     No facility-administered encounter medications on file as of 05/29/2014.        Objective:       Blood pressure 137/82, pulse 73, temperature 97 ??F (36.1 ??C), temperature source Oral, resp. rate 16, height 5' 9 (1.753 m), weight 159 lb (72.122 kg).    Physical Exam    Review of Lab Results:      Lab Results   Component Value Date    WBC 7.5 12/12/2013    HGB 14.4 12/12/2013    HCT 41.1 12/12/2013    PLT 178 12/12/2013    GLUCOSE 90 12/31/2013    CREATININE 1.01 12/31/2013    NA 137 12/31/2013    K 4.3 12/31/2013    CL 101 12/31/2013    CO2 30 12/31/2013    BILITOT 0.8 12/31/2013    PROT 7.8  12/31/2013    AST 131* 12/31/2013    ALT 254* 12/31/2013    ALKPHOS 100 12/31/2013       Imaging:   05-29-2014 Preliminary Diagnostic Mammogram:  Mild gynecomastia, Left greater than Right.      Pathology:       Cancer Staging:   No matching staging information was found for the patient.       Assessment:   28 y.o. male with history or psychiatric disorder, substance abuse and newly diagnosed with hepatitis C.  Now diagnostic breast imaging and clinical breast exam c/w gynecomastia.  Pt relieved that there is no evidence for a malignancy either on imaging or clinical exam.  However, the patient appears to have extensive hepatic disease and this is most likely the driver of his gynecomastia.  Pt has not followed through with referrals for general healthcare and hepatitis disease management.       Plan:   1.  Referral to infectious disease --  Hepatitis C  2.  Referral to endocrinology -- gynecomastia  3.   Referral to internal medicine (as per Emergency Dept.)  4.  Complete workup

## 2014-06-09 ENCOUNTER — Inpatient Hospital Stay: Admit: 2014-06-09 | Discharge: 2014-06-09 | Attending: Personal Emergency Response Attendant

## 2014-06-09 ENCOUNTER — Inpatient Hospital Stay
Admit: 2014-06-09 | Disposition: A | Discharge status: Home / Self Care | Source: Other Acute Inpatient Hospital | Attending: Psychiatry | Admitting: Psychiatry

## 2014-06-09 LAB — CBC WITH AUTO DIFFERENTIAL
Basophils %: 0.6 %
Basophils Absolute: 0 10*3/uL (ref 0.0–0.2)
Eosinophils %: 0.5 %
Eosinophils Absolute: 0 10*3/uL (ref 0.0–0.6)
Hematocrit: 42 % (ref 40.5–52.5)
Hemoglobin: 14.3 g/dL (ref 13.5–17.5)
Lymphocytes %: 10 %
Lymphocytes Absolute: 0.6 10*3/uL — ABNORMAL LOW (ref 1.0–5.1)
MCH: 31 pg (ref 26.0–34.0)
MCHC: 34.1 g/dL (ref 31.0–36.0)
MCV: 91 fL (ref 80.0–100.0)
MPV: 9.5 fL (ref 5.0–10.5)
Monocytes %: 8.2 %
Monocytes Absolute: 0.5 10*3/uL (ref 0.0–1.3)
Neutrophils %: 80.7 %
Neutrophils Absolute: 5.2 10*3/uL (ref 1.7–7.7)
Platelets: 203 10*3/uL (ref 135–450)
RBC: 4.61 M/uL (ref 4.20–5.90)
RDW: 13.6 % (ref 12.4–15.4)
WBC: 6.5 10*3/uL (ref 4.0–11.0)

## 2014-06-09 LAB — PROTIME-INR
INR: 0.96 (ref 0.85–1.16)
Protime: 10.4 s (ref 9.1–12.6)

## 2014-06-09 LAB — BASIC METABOLIC PANEL
Anion Gap: 14 (ref 3–16)
BUN: 13 mg/dL (ref 7–20)
CO2: 24 mmol/L (ref 21–32)
Calcium: 8.7 mg/dL (ref 8.3–10.6)
Chloride: 106 mmol/L (ref 99–110)
Creatinine: 1.2 mg/dL (ref 0.9–1.3)
GFR African American: >60 (ref >60)
GFR Non-African American: >60 (ref >60)
Glucose: 162 mg/dL — ABNORMAL HIGH (ref 70–99)
Potassium: 4.1 mmol/L (ref 3.5–5.1)
Sodium: 144 mmol/L (ref 136–145)

## 2014-06-09 LAB — HEPATIC FUNCTION PANEL
ALT: 460 U/L — ABNORMAL HIGH (ref 10–40)
AST: 313 U/L — ABNORMAL HIGH (ref 15–37)
Albumin: 4.5 g/dL (ref 3.4–5.0)
Alkaline Phosphatase: 135 U/L — ABNORMAL HIGH (ref 40–129)
Bilirubin, Direct: <0.2 mg/dL (ref 0.0–0.3)
Total Bilirubin: 0.3 mg/dL (ref 0.0–1.0)
Total Protein: 7.5 g/dL (ref 6.4–8.2)

## 2014-06-09 LAB — ACETAMINOPHEN LEVEL: Acetaminophen Level: <15 ug/mL (ref 10–30)

## 2014-06-09 LAB — SALICYLATE LEVEL: Salicylate, Serum: <0.3 mg/dL — ABNORMAL LOW (ref 15.0–30.0)

## 2014-06-09 LAB — ETHANOL: Ethanol Lvl: 42 mg/dL

## 2014-06-09 MED ORDER — ONDANSETRON HCL 4 MG/2ML IJ SOLN
4 MG/2ML | Freq: Once | INTRAMUSCULAR | Status: AC
Start: 2014-06-09 — End: 2014-06-09
  Administered 2014-06-09: 18:00:00 4 mg via INTRAVENOUS

## 2014-06-09 MED ORDER — SODIUM CHLORIDE 0.9 % IV BOLUS
0.9 % | Freq: Once | INTRAVENOUS | Status: AC
Start: 2014-06-09 — End: 2014-06-09
  Administered 2014-06-09: 18:00:00 1000 mL via INTRAVENOUS

## 2014-06-09 MED ORDER — THIAMINE HCL 100 MG/ML IJ SOLN
100 MG/ML | Freq: Once | INTRAMUSCULAR | Status: AC
Start: 2014-06-09 — End: 2014-06-09
  Administered 2014-06-09: 19:00:00 100 mg via INTRAVENOUS

## 2014-06-09 MED ADMIN — acetaminophen (TYLENOL) tablet 500 mg: 500 mg | ORAL | @ 19:00:00 | NDC 50580045110

## 2014-06-09 MED FILL — TYLENOL EXTRA STRENGTH 500 MG PO TABS: 500 MG | ORAL | Qty: 1

## 2014-06-09 MED FILL — THIAMINE HCL 100 MG/ML IJ SOLN: 100 MG/ML | INTRAMUSCULAR | Qty: 2

## 2014-06-09 MED FILL — ONDANSETRON HCL 4 MG/2ML IJ SOLN: 4 MG/2ML | INTRAMUSCULAR | Qty: 2

## 2014-06-09 NOTE — ED Notes (Addendum)
Pt states he had been drinking alcohol today because it's his day off, he "drank quite a bit", then admitted to using heroin, then losing consciousness on the sidewalk.  Pt brought in by EMS, per ems, pt found unresponsive and responded to Narcan.    Toya SmothersMelissa X Yanni Quiroa, RN  06/09/14 1406    Toya SmothersMelissa X Wadsworth Skolnick, RN  06/09/14 1408

## 2014-06-09 NOTE — ED Notes (Signed)
PT's belongings are boots, socks, pants, belt, underwear, chain with wallet, $1.24 in cash, cell phone and charger, cigarettes and lighter and 2 keys.  Belongings placed in nurses' station cabinet.    Toya SmothersMelissa X Annisten Manchester, RN  06/09/14 [phone]

## 2014-06-09 NOTE — ED Provider Notes (Signed)
[facility]     eMERGENCY dEPARTMENT eNCOUnter   [facility]      Pt Name: Brett [name]  MRN: [id number]  [place] 1986/09/23  Date of evaluation: 06/09/2014    CHIEF COMPLAINT     Loss of Consciousness    Nursing Notes, Past Medical Hx, Past Surgical Hx, Social Hx, Allergies, and Family Hx were all reviewed and agreed with, or any disagreements were addressed in the HPI.    HISTORY OF PRESENT ILLNESS     Leonel Wakley is a 28 y.o. male who presents with complaints of depression, suicidal thoughts, substance abuse and alcoholism.  This patient is evaluated by me at 2:02 PM and presented by local EMS after he was found along the road unconscious.  Paramedics administered naloxone to the patient and he had a return to consciousness.  He indicates that he has a long-standing family history of depression and both his brothers successfully committed suicide.  His father also recently died and he just been told that he has been fired from his job.  He states he is hopeless and feels that he is at risk to himself to be home.  He states he has daily thoughts of suicide but denies a specific plan.  He reports that he first was admitted to the hospital for depression at age 64 and is currently not taking medications that were prescribed at the [facility] 6 months ago.  He estimates he has been off of his antidepressants for 90 days.  He reports daily alcohol consumption.  He states he was drunk at work today and somehow got ahold of some heroin and took it.  He denies recent illness.  Reports no fever, chills, headache, chest pain, shortness of breath, abdominal pain but does admit to some nausea at the present time.    REVIEW OF SYSTEMS       Review of Systems   Constitutional: Positive for malaise/fatigue.   Eyes: Negative.    Respiratory: Negative.    Cardiovascular: Negative.    Gastrointestinal: Positive for nausea. Negative for vomiting and abdominal pain.   Genitourinary:  Negative.    Musculoskeletal: Positive for myalgias.   Skin: Negative.    Neurological: Positive for tremors, weakness and headaches. Negative for dizziness and tingling.   Psychiatric/Behavioral: Positive for depression, suicidal ideas and substance abuse. The patient is nervous/anxious and has insomnia.    All other systems reviewed and are negative.    "Remaining review of systems reviewed and negative. I have reviewed the nursing triage documentation and agree unless otherwise noted below."     [place]     He has a past medical history of Thyroid disease and Elevated LFTs.    SURGICAL HISTORY       He has past surgical history that includes Mandible fracture surgery.    CURRENT MEDICATIONS       Previous Medications    No medications on file       ALLERGIES       He has No Known Allergies.    FAMILY HISTORY       He has no family status information on file.    He has a family history is not on file.    SOCIAL HISTORY       He reports that he has been smoking Cigarettes.  He has been smoking about 2.00 packs per day. He does not have any smokeless tobacco history on file. He reports that he  drinks about 7.2 oz of alcohol per week. He reports that he uses illicit drugs.    PHYSICAL EXAM       INITIAL VITALS: BP 150/91    Pulse 81    Temp(Src) 98.3 ??F (36.8 ??C) (Oral)    Resp 12    Ht 5' 9"$  (1.753 m)    Wt 64.864 kg (143 lb)    BMI 21.11 kg/m2      SpO2 94%    Physical Exam   Constitutional: He is oriented to person, place, and time and well-developed, well-nourished, and in no distress. No distress.   HENT:   Head: Normocephalic and atraumatic.   Eyes: Conjunctivae and EOM are normal. Pupils are equal, round, and reactive to light. No scleral icterus. Right eye exhibits no nystagmus. Left eye exhibits no nystagmus.   Cardiovascular: Regular rhythm and normal heart sounds.  Tachycardia present.    Pulmonary/Chest: Effort normal and breath sounds normal. No respiratory distress.   Abdominal: Soft. Bowel  sounds are normal. He exhibits no distension. There is no tenderness. There is no rebound.   Musculoskeletal: Normal range of motion. He exhibits no edema or tenderness.   Neurological: He is alert and oriented to person, place, and time.   Skin: Skin is warm and dry. He is not diaphoretic.   Psychiatric: Memory normal. He expresses impulsivity. He exhibits a depressed mood. He expresses suicidal ideation. He expresses no suicidal plans. He is apathetic. He exhibits disordered thought content.   Nursing note and vitals reviewed.    DIAGNOSTIC RESULTS     EKG: All EKG's are interpreted by the Emergency Department Physician who either signs or co-signs this chart in the absence of a cardiologist.  EKG 12 Lead  Date/Time: 06/09/2014 2:03 PM  Performed by: Ok Edwards  Authorized by: Ok Edwards    ECG reviewed by ED Physician in the absence of a cardiologist: no    Previous ECG:     Previous ECG:  Unavailable  Interpretation:     Interpretation: non-specific    Rate:     ECG rate:  114    ECG rate assessment: tachycardic    Rhythm:     Rhythm: sinus tachycardia    Ectopy:     Ectopy: none    QRS:     QRS axis:  Normal  Conduction:     Conduction: normal    ST segments:     ST segments:  Normal  T waves:     T waves: normal      LABS:   Labs Reviewed   BASIC METABOLIC PANEL - Abnormal; Notable for the following:     Glucose 162 (*)     All other components within normal limits   SALICYLATE LEVEL - Abnormal; Notable for the following:     Salicylate, Serum [id number] (*)     All other components within normal limits   CBC WITH AUTO DIFFERENTIAL - Abnormal; Notable for the following:     Lymphocytes Absolute 0.6 (*)     All other components within normal limits   HEPATIC FUNCTION PANEL - Abnormal; Notable for the following:     Alkaline Phosphatase 135 (*)     ALT 460 (*)     AST 313 (*)     All other components within normal limits   ETHANOL   ACETAMINOPHEN LEVEL   PROTIME-INR   DRUG SCREEN MULTI URINE      RECENT VITALS:  BP: 150/91 mmHg,  Temp: 98.3 ??F (36.8 ??C), Pulse: 81, Resp: 12     EMERGENCY DEPARTMENT COURSE:     CONSULTS:  None    PROCEDURES:  Medications   thiamine (B-1) injection 100 mg (100 mg Intravenous Given 06/09/14 1432)   0.9 % sodium chloride bolus (0 mLs Intravenous Stopped 06/09/14 1507)   ondansetron (ZOFRAN) injection 4 mg (4 mg Intravenous Given 06/09/14 1423)   acetaminophen (TYLENOL) tablet 500 mg (500 mg Oral Given 06/09/14 1444)       DIFFERENTIAL DIAGNOSIS/ MDM:     3:26 PM this patient is medically cleared at this time.  I have paged the psychiatrist at [facility] because the patient indicates he is suicidal and feels he is a risk to himself at home.    5:06 PM case discussed with psychiatrist Dr. Phillip Heal at [facility].  The patient will be transferred by ambulance and he has accepted the patient.  Patient remains medically clear and stable.  He is cooperative and has voluntarily requested admission as he states he feels unsafe at home.    FINAL IMPRESSION      1. Major depression ([place])    2. Acute alcoholic intoxication    3. Heroin adverse reaction          DISPOSITION/PLAN     DISPOSITION Decision to Transfer    PATIENT REFERRED TO:  No follow-up provider specified.    DISCHARGE MEDICATIONS:  New Prescriptions    No medications on file       (Please note that portions of this note were completed with a voice recognition program.  Efforts were made to edit the dictations but occasionally words are mis-transcribed.)        Ok Edwards, MD  06/09/14 1706

## 2014-06-09 NOTE — ED Notes (Signed)
Pt being moved to the "safe room", belongings being gathered and bagged at this time.    Almira Coaster, RN  06/09/14 [phone]

## 2014-06-09 NOTE — ED Notes (Signed)
PT given ice water and blanket, no visitors, pt states headache. VSS, NS bolus infusing, meds given per md order, will monitor.    Toya SmothersMelissa X Giacomo Valone, RN  06/09/14 [phone]

## 2014-06-09 NOTE — ED Notes (Signed)
Pt attempted to urinate prior to transfer. Unable to and states "I can't ever pee when I do heroin." VSS and WNL. Copy of consents and transfer paperwork sent with transfer team.    Charlcie CradleStephanie Mellody Masri, RN  06/09/14 1814

## 2014-06-09 NOTE — ED Notes (Signed)
Pt picked up by ems, ambulated to stretcher, iv removed, pt alert and oriented, agreeable to transfer.    Almira Coaster, RN  06/09/14 1816

## 2014-06-09 NOTE — ED Notes (Signed)
Report called to [facility].    Almira Coaster, RN  06/09/14 1751

## 2014-06-09 NOTE — Behavioral Health Treatment Team (Signed)
`  [facility]  Admission Note     Admission Type:   Admission Type: Voluntary    Reason for admission:  Reason for Admission: +SI with suicide attempt by OD: heroin, off meds for months, heroin daily, #12 beers daily, fired from job today    PATIENT STRENGTHS:  Strengths: Positive Support, Social Skills    Patient Strengths and Limitations:  Limitations: Inappropriate/potentially harmful leisure interests, Difficulty problem solving/relies on others to help solve problems    Addictive Behavior:   Addictive Behavior  In the past 3 months, have you felt or has someone told you that you have a problem with:  : None  Do you have a history of Chemical Use?: Yes  Do you have a history of Alcohol Use?: Yes  Do you have a history of Street Drug Abuse?: Yes  Histroy of Prescripton Drug Abuse?: Yes    Medical Problems:   Past Medical History   Diagnosis Date   ??? Thyroid disease    ??? Elevated LFTs        Status EXAM:  Status and Exam  Normal: No  Facial Expression: Flat, Sad  Affect: Blunt  Level of Consciousness: Alert  Mood:Normal: No  Mood: Depressed, Anxious, Despair  Motor Activity:Normal: Yes  Interview Behavior: Cooperative  Preception: Orient to Person, Orient to Time, Orient to Place, Orient to Situation  Attention:Normal: No  Attention: Distractible  Thought Content:Normal: Yes  Hallucinations: None  Delusions: No  Memory:Normal: No  Memory: Poor Recent  Insight and Judgment: No  Insight and Judgment: Poor Insight, Poor Judgment  Present Suicidal Ideation: Yes  Present Homicidal Ideation: No    Pt admitted with followings belongings:  Dentures: None  Vision - Corrective Lenses: Glasses (on patient)  Hearing Aid: None  Jewelry: Ring (On patient)  Clothing: Pants, Socks, Undergarments (Comment), Footwear (all in locker)  Were All Patient Medications Collected?: No  Other Valuables: Other (Comment), Cell phone, Keys, Wallet [facility] check card, lighter & cigarettes in safe on unit. Cell phone & charger in  locker)     Valuables sent to locker. Patient's home medications were : n/a.  Patient oriented to surroundings and program expectations and copy of patient rights given. Received admission packet:  yes.  Consents reviewed, signed yes. Refused yes.                     Patient verbalize understanding:  yes.    Patient education on precautions: yes                   Levora DredgeBrandon C Fredi Geiler, RN

## 2014-06-09 NOTE — Behavioral Health Treatment Team (Signed)
Author rec'd RN-to-RN report from [employer]issa [email or web address]  [place] r/t transfer to [facility] @ [facility], bed 2308-01.      Author requested IV access be d/c'd prior to transfer, and for [facility] ED provider to consider add-on labs r/t pt's h/o thyroid disease prior to xfer.      Author inquired re: pt's elevated AST/ALT: Melissa RN reports ED provider attributed this to chronic ETOH abuse, per report pt drinks #12 beers daily.  Melissa RN reports pt is not showing any s/sn of w/d beyond elevated BP which she reports is a chronic issue.

## 2014-06-09 NOTE — Behavioral Health Treatment Team (Signed)
Provided pt with [employer] handout entitled "Quitting Smoking." Reviewed handout with pt addressing dangers of smoking, developing coping skills, and providing basic information about quitting. Pt response to counseling: Verablized understanding.

## 2014-06-09 NOTE — Behavioral Health Treatment Team (Addendum)
Patient is admitted from ED to bed 2308-01. The reason for Brett [name]'s admission is +SI with suicide attempt by OD: heroin. Stressors that lead up to the admission are: pt got fired from job today, heroin & ETOH relapse, dad died recently, non-compliant with psych meds for "months": was being treated for bipolar d/o. H/o severe ETOH w/d s/sn in past including visual hallucinations, sweats, tremors, and possible seizures.  Pt has a family history of father and 2 brothers mental illness, 2 brothers suicided; mom, dad, and 2 brothers all drugs and ETOH. Brett [name] has had out patient treatment at [facility] in past  and inpatient treatment at [facility] in November of 2014. Pt is admitted by Dr. Cheree DittoGraham and attending Dr. Cheree DittoGraham with a diagnosis of mood d/o.

## 2014-06-09 NOTE — ED Notes (Signed)
PT admits to being "not safe to himself" and suicidal.  Pt tearful discussing his two brothers suicides and his dad's death.  RN reassuring pt that we will keep him safe and it's rns responsibility to let MD know about this. Pt in agreement.  Dr. Jones Skene aware of pt's thoughts at this time.      Almira Coaster, RN  06/09/14 1517

## 2014-06-09 NOTE — ED Notes (Signed)
Pt given dinner tray and crackers.    Toya SmothersMelissa X Aksh Swart, RN  06/09/14 1540

## 2014-06-10 DIAGNOSIS — F39 Unspecified mood [affective] disorder: Secondary | ICD-10-CM

## 2014-06-10 LAB — AMMONIA: Ammonia: 50 umol/L (ref 16–60)

## 2014-06-10 LAB — MAGNESIUM: Magnesium: 1.9 mg/dL (ref 1.80–2.40)

## 2014-06-10 MED ORDER — ACETAMINOPHEN 325 MG PO TABS
325 | ORAL | Status: DC | PRN
Start: 2014-06-10 — End: 2014-06-12
  Administered 2014-06-10 – 2014-06-11 (×4): 650 mg via ORAL

## 2014-06-10 MED ADMIN — nicotine (NICODERM CQ) 21 MG/24HR 1 patch: 1 | TRANSDERMAL | @ 13:00:00 | NDC 00067512609

## 2014-06-10 MED ADMIN — LORazepam (ATIVAN) tablet 2 mg: 2 mg | ORAL | @ 07:00:00 | NDC 68084075411

## 2014-06-10 MED ADMIN — ziprasidone (GEODON) capsule 20 mg: 20 mg | ORAL | @ 20:00:00 | NDC 68180033107

## 2014-06-10 MED ADMIN — LORazepam (ATIVAN) tablet 1 mg: 1 mg | ORAL | @ 20:00:00 | NDC 68084074211

## 2014-06-10 MED ADMIN — LORazepam (ATIVAN) tablet 1 mg: 1 mg | ORAL | @ 10:00:00 | NDC 68084074211

## 2014-06-10 MED ADMIN — LORazepam (ATIVAN) tablet 1 mg: 1 mg | ORAL | @ 16:00:00 | NDC 68084074211

## 2014-06-10 MED ADMIN — LORazepam (ATIVAN) tablet 2 mg: 2 mg | ORAL | @ 13:00:00 | NDC 68084075411

## 2014-06-10 MED ADMIN — haloperidol (HALDOL) tablet 5 mg: 5 mg | ORAL | @ 18:00:00 | NDC 00904592561

## 2014-06-10 MED ADMIN — LORazepam (ATIVAN) tablet 2 mg: 2 mg | ORAL | @ 15:00:00 | NDC 68084075411

## 2014-06-10 MED ADMIN — famotidine (PEPCID) tablet 20 mg: 20 mg | ORAL | @ 20:00:00 | NDC 51079096601

## 2014-06-10 MED ADMIN — LORazepam (ATIVAN) tablet 2 mg: 2 mg | ORAL | @ 18:00:00 | NDC 68084075411

## 2014-06-10 MED ADMIN — cloNIDine (CATAPRES) tablet 0.1 mg: 0.1 mg | ORAL | @ 15:00:00 | NDC 00904565661

## 2014-06-10 MED ADMIN — ziprasidone (GEODON) capsule 20 mg: 20 mg | ORAL | @ 18:00:00 | NDC 68001013606

## 2014-06-10 MED ADMIN — haloperidol (HALDOL) 5 MG tablet: ORAL | @ 13:00:00 | NDC 00781139613

## 2014-06-10 MED ADMIN — folic acid (FOLVITE) tablet 1 mg: 1 mg | ORAL | @ 13:00:00 | NDC 51079010501

## 2014-06-10 MED ADMIN — vitamin B-1 (THIAMINE) tablet 100 mg: 100 mg | ORAL | @ 13:00:00 | NDC 00904054460

## 2014-06-10 MED ADMIN — LORazepam (ATIVAN) tablet 1 mg: 1 mg | ORAL | @ 01:00:00 | NDC 68084074211

## 2014-06-10 MED FILL — LORAZEPAM 1 MG PO TABS: 1 MG | ORAL | Qty: 1

## 2014-06-10 MED FILL — TYLENOL 325 MG PO TABS: 325 MG | ORAL | Qty: 2

## 2014-06-10 MED FILL — GEODON 80 MG PO CAPS: 80 MG | ORAL | Qty: 1

## 2014-06-10 MED FILL — FAMOTIDINE 20 MG PO TABS: 20 MG | ORAL | Qty: 1

## 2014-06-10 MED FILL — LORAZEPAM 2 MG PO TABS: 2 MG | ORAL | Qty: 1

## 2014-06-10 MED FILL — ZIPRASIDONE HCL 20 MG PO CAPS: 20 MG | ORAL | Qty: 1

## 2014-06-10 MED FILL — VITAMIN B-1 100 MG PO TABS: 100 MG | ORAL | Qty: 1

## 2014-06-10 MED FILL — LORAZEPAM 2 MG PO TABS: 2 MG | ORAL | Qty: 2

## 2014-06-10 MED FILL — CLONIDINE HCL 0.1 MG PO TABS: 0.1 MG | ORAL | Qty: 1

## 2014-06-10 MED FILL — HALOPERIDOL 5 MG PO TABS: 5 MG | ORAL | Qty: 1

## 2014-06-10 MED FILL — FOLIC ACID 1 MG PO TABS: 1 MG | ORAL | Qty: 1

## 2014-06-10 MED FILL — NICODERM CQ 21 MG/24HR TD PT24: 21 MG/24HR | TRANSDERMAL | Qty: 1

## 2014-06-10 NOTE — Progress Notes (Signed)
Informed by BHT and LPN of Brenton's elevated BP 173/118; discussed with Leotis ShamesLauren, PA.

## 2014-06-10 NOTE — Behavioral Health Treatment Team (Signed)
[facility]  Initial Interdisciplinary Treatment Plan NOTE    Review Date & Time: 06/10/14 0930    Admission Type:  Admission Type: Voluntary    Reason for admission:   Reason for Admission: +SI with suicide attempt by OD: heroin, off meds for months, heroin daily, #12 beers daily, fired from job today    Estimated Length of Stay:  3-5 days  Estimated Discharge Date: 06/15/14    PATIENT STRENGTHS:  Patient Strengths:Strengths: Positive Support, Social Skills  Patient Strengths and Limitations:Limitations: Inappropriate/potentially harmful leisure interests, Difficulty problem solving/relies on others to help solve problems  Addictive Behavior: Addictive Behavior  In the past 3 months, have you felt or has someone told you that you have a problem with:  : None  Do you have a history of Chemical Use?: Yes  Do you have a history of Alcohol Use?: Yes  Do you have a history of Street Drug Abuse?: Yes  Histroy of Prescripton Drug Abuse?: Yes  Medical Problems:  Past Medical History   Diagnosis Date   ??? Thyroid disease    ??? Elevated LFTs      Status EXAM:Status and Exam  Normal: No  Facial Expression: Flat, Sad  Affect: Blunt  Level of Consciousness: Alert  Mood:Normal: No  Mood: Depressed, Anxious, Despair  Motor Activity:Normal: Yes  Interview Behavior: Cooperative  Preception: Orient to Person, Orient to Time, Orient to Place, Orient to Situation  Attention:Normal: No  Attention: Distractible  Thought Content:Normal: Yes  Hallucinations: None  Delusions: No  Memory:Normal: No  Memory: Poor Recent  Insight and Judgment: No  Insight and Judgment: Poor Insight, Poor Judgment  Present Suicidal Ideation: Yes  Present Homicidal Ideation: No    EDUCATION:   Learner Progress Toward Treatment Goals: Reviewed goals and plan of care    Method: Individual    Outcome: Verablized understanding    PATIENT GOALS: help myself feel better physically    PLAN/TREATMENT RECOMMENDATIONS: detox safely; groups    SHORT-TERM GOALS:    Time frame for Short-Term Goals: 2 days    LONG-TERM GOALS:  Time frame for Long-Term Goals: 4 days  Members Present in Team Meeting: See Signature Sheet    Tera MaterAmber Fredrick, RN

## 2014-06-10 NOTE — Behavioral Health Treatment Team (Signed)
Ativan 2 mg and Haldol 5 mg given r/t CIWA score. Discussed medication with PA. Scheduled Geodon 20 mg also given. Was able to eat a hamberger with cheese and drank 240 ml of orange juice.

## 2014-06-10 NOTE — H&P (Signed)
PATIENT NAME:                 PA #:            MR #Brett [name]:                 [name], Brett                [id number]       [id number]            ATTENDING PHYSICIAN:                  ADM DATE:   DIS DATE:          Elberta FortisLARRY A GRAHAM, MD                    06/09/2014                     DATE OF BIRTH:   AGE:           PATIENT TYPE:     RM #:              26-Nov-1986       27             PYC               2308                  IDENTIFYING INFORMATION:  The paient is a 28 year old white male who  presented to the ED at [facility] yesterday.   He presented with  suicidal ideation and substance abuse issues.      HISTORY OF PRESENT ILLNESS:  Brett [name] presented to the ED after he was found  along the road unconscious.  He was given Naloxone, and was then aroused.  He  has a long history of depression and stated that he has been using heroin on  a daily basis over the past 7 years and primarily snorting at this point.  He  also uses alcohol, 12-pack per day and has been for the past year.  He states  he is hopeless and feels depressed.  He is currently not in treatment and  dropped out of [facility] a couple of months ago.  He states he has  thoughts of suicide daily, but no plan.  Of significance, he has 2 brothers  that suicided in 2008 and 2009, and his dad died from drug-related issues.   He has a long history of mental health issues as well.  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  He states that  he was drunk at work, and somehow got a hold of some heroin and took it.  He  has had prior suicide attempt with an overdose in 2014.  He states that he is  not currently suicidal, but states that he wants to get help for his  problems.       PRIOR PSYCHIATRIC HISTORY: He had been at [facility] in [place], but  stopped a couple months ago.  He has been off his medications for 1 to 2  months he stated.  He was a  [facility] inpatient in 2014.  [place] in [place]  [place] 4 different times.  He was also hospitalized at age 28  with anger issues.       CURRENT MEDICATIONS:  None.  He had been on Lamictal, Trazodone and Geodon.       MEDICAL HISTORY:  Significant for Hepatitis C and history of thyroid disease  with elevated liver function tests.      ALLERGIES TO MEDICATIONS:  NONE KNOWN.     FAMILY PSYCHIATRIC HISTORY:  Significant for 2 brothers who suicided in 2008  and 2009.  Long family history of drug and alcohol abuse.  Dad apparently died from  withdrawal issues from drugs.       SOCIAL HISTORY:  He lives with his girlfriend in his apartment.  [place].  He moved back here in 2014, and had been living in [place] a period of time.  He was working in Glass blower/designersmall engine repair, but lost his  job yesterday due to substance use on the job.       REVIEW OF SYSTEMS:  Significant for depression.      PHYSICAL EXAMINATION: Reviewed.       VITAL SIGNS: Were also reviewed, and today his blood pressure is 173/118, and  it has been fluctuating with elevations of diastolics of 93, 68 and 99 prior  to this morning.  Pulse was 91.  Respirations 14 today.       His drug screen apparently had been ordered on June 09, 2014, but it was  cancelled for some reason, so another one is currently being ordered.       EKG showed a QTC interval of 438.  Alcohol level at admission was 42.       MENTAL STATUS EXAMINATION: The patient is a 28 year old white male who was  cooperative and pleasant.  He appeared subdued.  He states that he still  feels depressed and his affect was constricted.  His speech was normal rate  and tone.  He made minimal eye contact.  Thoughts were logical.  No auditory  of visual hallucinations, and denied being actively suicidal or homicidal at  this time.  He felt somewhat tremulous, most likely the result of withdrawal  effects.  His insight and judgement is impaired.  He was alert and oriented  to place and time. Fund of knowledge and language were  appropriate as was  attention and concentration. He was able to recall 3 objects immediately.   Did not walk. Did not appear tremulous, although he stated that he felt  tremulous.      DIAGNOSES: AXIS I:    1.  Mood disorder, unspecified.  2.  Alcohol abuse.   3.  Heroin abuse.   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   AXIS III:  Hepatitis C, history of thyroid disease, elevated liver function  tests.   AXIS IV:  Severe.   AXIS V:  30.      PLAN:    1.  We will restart Geodon 20 mg b.i.d.   2.  Trazodone 50 mg at night as needed for sleep.   3.  We will need to monitor liver function tests.   4.  CINA and CWIA precautions for alcohol and heroin withdrawal.   5.  We will begin Clonidine 0.1 mg b.i.d. for elevated blood pressure.   6.  Needs referred for outpatient treatment through [facility].   7.  Goal for discharge would be no threats to harm self, and to develop  appropriate coping strategies and insight.    8.  Estimated length of stay is 3 to 4 days.  Arelia Longest, MD     [id number]  DD: 06/10/2014 10:59   DT: 06/10/2014 11:29   Job #: [id number]  CC: Elberta Fortis, MD  CC: Arelia Longest, MD

## 2014-06-10 NOTE — Progress Notes (Signed)
Brett [name] arrived to group on time. He appeared lethargic and reported feeling "tired". He was encouraged to return to his room if needing sleep.  Lidia CollumNichole Heli Dino, MS, ATR-BC

## 2014-06-10 NOTE — Progress Notes (Signed)
Time: 2015-2045      Type of Group: Wrap Up      Level of Participation: Patient didn't attend      Comments: New admission

## 2014-06-10 NOTE — Progress Notes (Signed)
Collaborated with Dr. Dutch QuintEppley and Eunice Blaseebbie LPN r/t pt alcohol withdrawal. Plans to be proactive with CIWA, given pt history of DTs.

## 2014-06-10 NOTE — ED Notes (Signed)
Writer completed pre-cert worksheet for verified insurance from Registration and emailed it and doctor's notes, and admission nursing note to [employer] in [place] @ [phone] hours.    Alexander BergeronRebecca Zugg, BHT II  06/10/2014

## 2014-06-10 NOTE — Behavioral Health Treatment Team (Signed)
CIWA score 6. Able to shower and attend to ADL's. Pleasant affect.

## 2014-06-10 NOTE — H&P (Signed)
Behavioral Health Medical H&P    PCP: No primary provider on file.    Date of Admission: 06/09/2014    Date of Service: Pt seen/examined on 06/10/2014    Chief Complaint:  Increased depression and suicidal ideation      History Of Present Illness:      The patient is a 28 y.o. male who presents to [facility] for suicidal ideation, polysubstance abuse, and increased depressive symptoms. This is the medical H&P. Please refer to the attending provider's H&P.    The patient is currently going through detox of heroin and alcohol. He reports that he's having nausea, hot flashes, sweats, dry mouth, non-productive cough. He denies having any vomiting or diarrhea at this time. His blood pressure is elevated and his pulse is tachycardic. I will be working with the attending provider to adjust the patient's CINA and CIWA for better control of blood pressure and pulse. We reviewed his past medical and surgical history together. The patient reports that he has mild abdominal pain over the RUQ area and is positive for a history of Hepatitis C. He reports that he has been told that he has an issue with his thyroid but he's not sure what exactly it is. He denies having a history of asthma, diabetes mellitus, irritable bowel syndrome. He denies having any allergies to food, medical or latex.    Past Medical History:        Diagnosis Date   ??? Thyroid disease    ??? Elevated LFTs        Past Surgical History:        Procedure Laterality Date   ??? Mandible fracture surgery         Medications Prior to Admission:    Prior to Admission medications    Not on File       Medications:  Scheduled Meds:  ??? ziprasidone  20 mg Oral BID WC   ??? cloNIDine  0.1 mg Oral BID   ??? nicotine  1 patch Transdermal Daily   ??? thiamine  100 mg Oral Daily   ??? folic acid  1 mg Oral Daily       PRN Meds:  haloperidol, haloperidol lactate, traZODone, acetaminophen, hydrOXYzine, ziprasidone, magnesium hydroxide, famotidine, LORazepam, LORazepam, LORazepam,  LORazepam    Allergies:  Review of patient's allergies indicates no known allergies.    Social History:  The patient reports abusing both alcohol and heroin.    TOBACCO:   reports that he has been smoking Cigarettes.  He has a 24 pack-year smoking history. He has never used smokeless tobacco.  ETOH:   reports that he drinks about 50.4 oz of alcohol per week.      Family History:  Reviewed in detail. Positive as follows:        Problem Relation Age of Onset   ??? Substance Abuse Mother    ??? Mental Illness Father    ??? Substance Abuse Father    ??? Mental Illness Brother    ??? Substance Abuse Brother        REVIEW OF SYSTEMS:     General:  Denies fevers and chills. No significant changes in weight.  Skin:  The patient denies any rashes or lesions at this time.  Head:  The patient denies having recurrent headaches or experiencing dizziness.  Eyes:  The patient denies vision changes, including blurred vision, double vision and halos.   Ears:  The patient denies tinnitus and vertigo. He denies having an earache  at this time.  Nose: The patient denies nasal congestion and rhinorrhea.   Mouth:  The patient denies any bleeding gums or sores in his mouth or pharyngitis at this time.  Respiratory:  The patient denies having a cough or dyspnea. He denies having a history of COPD and asthma.  Cardiac:  The patient denies any chest pain or pressure at this time.   Abdominal:  The patient denies any recent changes in appetite and bowel movements. The patient reports having a history of hepatitis C.  Urinary:  The patient denies any dysuria, hematuria and polyuria.  Peripheral Vascular:  The patient denies any leg cramps, ulcers or chronic edema.  Musculoskeletal:  The patient denies any muscle or joint pain. ROM of all limbs is within normal limits for the patient.  Neurologic:  The patient denies any changes in attention, speech or memory.  Endocrine:  The patient denies having heat or cold intolerace at this time. He denies having  excessive thirst or hunger. There is no history of thyroid problems.        PHYSICAL EXAM:    BP 164/101    Pulse 112    Resp 14    Ht 5\' 9"  (1.753 m)    Wt 143 lb (64.864 kg)    BMI 21.11 kg/m2       General appearance: No apparent distress appears stated age and cooperative.  HEENT Normal cephalic, atraumatic without obvious deformity.  Pupils equal, round, and reactive to light.  Conjunctivae/corneas clear. Oropharynx is moist and without erythema or exudate. Nares are clear.  Neck: Supple, No jugular venous distention/bruits.  Trachea midline without thyromegaly or adenopathy with full range of motion.  Lungs: Clear to auscultation, bilaterally without Rales/Wheezes/Rhonchi with good respiratory effort.  Heart: Rate over 100 and regular rhythm with Normal S1/S2 without murmurs, rubs or gallops  Abdomen: Tenderness with palpation of the RUQ. Soft,  non-distended without rigidity or guarding and positive bowel sounds all four quadrants.  Extremities: 3+ radial pulse.No clubbing, cyanosis, or edema bilaterally.  Full range of motion without deformity and normal gait intact.  Skin: Skin color, texture, turgor normal.  Moist and warm to the touch.  Neurologic: Alert and oriented X 3, neurovascularly intact with sensory/motor intact upper extremities/lower extremities, bilaterally.    Mental status: Alert, oriented, thought content appropriate.      CBC   Recent Labs      06/09/14   1533   WBC  6.5   HGB  14.3   HCT  42.0   PLT  203      RENAL  Recent Labs      06/09/14   1425   NA  144   K  4.1   CL  106   CO2  24   BUN  13   CREATININE  1.2     LFT'S  Recent Labs      06/09/14   1425   AST  313*   ALT  460*   BILIDIR  <0.2   BILITOT  0.3   ALKPHOS  135*     COAG  Recent Labs      06/09/14   1425   INR  0.96         Active Hospital Problems    Diagnosis Date Noted   ??? Unspecified episodic mood disorder (HCC) [296.90] 06/10/2014   ??? Heroin abuse [305.50] 06/10/2014   ??? Alcohol abuse [305.00] 06/10/2014  ASSESSMENT:  1.  Mood Disorder NOS  2.  Alcohol Abuse  3.  Heroin Abuse  4.  Hepatitis C    PLAN:  1.  Reviewed lab work and orders. Working with Psychiatrist to adjust CIWA for better control of patient's blood pressure.  2.  Please refer to the attending provider's H&P for assessment and treatment of psychiatric problems.        Diet: DIET GENERAL;  Code Status: Full Code      Trixie DeisLauren Manan Olmo, PA-C

## 2014-06-10 NOTE — Plan of Care (Signed)
Problem: Altered Mood, Depressive Behavior  Goal: LTG-Able to verbalize and/or display a decrease in depressive symptoms  Outcome: Ongoing  Continues as depressive/sad/anxious.  Goal: STG-Absence of Self Harm  Outcome: Met This Shift  No self harm reported or observed this shift.     Goal: STG-Knowledge of positive coping patterns  Outcome: Ongoing  In acute withdrawal from alcohol and is not feeling well.    Problem: Substance Abuse  Goal: LTG-Absence of acute withdrawl symptoms  Outcome: Ongoing  In acute alcohol withdrawal AEB CIWA (subjective) and objective data (BP elevated).  Goal: STG-Knowledge of community resources  Outcome: Ongoing    Comments:   [name] has been medication compliant. Patient has maintained his safety as evidenced by lack of harm to self or others.    Brett [name] appears disheveled with blunt affect. Patient's mood is noted as depressive. Writer notes his speech is appropriate. [name]'s thought content and process is goal oriented and spontaneous. There are perceptive disturbances reported or observed (r/t withdrawal) and patient has fair  impulse control, poor insight, and poor judgement.    Collaborated with LPN and BHT on patient progress and presentation. Routine 15 minute checks completed throughout shift. Will continue to monitor.   [name], RN, 06/10/2014

## 2014-06-10 NOTE — Other (Addendum)
Patient Acct Nbr:  [id number]  Primary AUTH/CERT:    Primary Insurance Company Name:   [employer]  Primary Insurance Group Number:    Primary Insurance Plan Type: N  Primary Insurance Policy Number:  [id number]

## 2014-06-10 NOTE — Behavioral Health Treatment Team (Signed)
B/P 138/95 heart rate 81. Tylenol 650 mg given for c/o headache, Pepcid 20 mg for c/o upset stomach, Ativan 1 mg for CIWA of 8.

## 2014-06-10 NOTE — Behavioral Health Treatment Team (Signed)
Comments: - interactions, - contribution, and - engagement.      Time: 1550-1630       Type of Group: Health and Wellness       Level of Participation: non active, did not participate.

## 2014-06-10 NOTE — Behavioral Health Treatment Team (Signed)
Comments: Pt did not attend this group.     Time: 2030      Type of Group: Wrap-Up Group      Level of Participation: n/a

## 2014-06-10 NOTE — Progress Notes (Signed)
NUTRITION THERAPY ASSESSMENT     Admission Date: 06/09/2014      Time point: Initial    Brett [name] is a 28 y.o.  male admitted with MOOD DISORDER . Consult received for unintentional weight loss of 15 lb in one month , poor oral intake    SUBJECTIVE DATA:   06/10/2014:Patient admitted to [facility] for suicidal ideation. History includes polysubstance and alcohol abuse. Currently nausea with detox of alcohol and heroine. History of Hep.C. General diet ordered. CIWA scale ordered. In bed at time of visit. Will continue to follow.     NUTRITION RECOMMENDATIONS / PRESCRIPTION / MONITORING / EVALUATION  1. General as tolerated  2. Small, frequent feedings  3. Supplementation if oral intake <50%  4. Actual weight requested      Education:PRN    OBJECTIVE DATA:  Past Medical History   Diagnosis Date   ??? Thyroid disease    ??? Elevated LFTs       Labs:  Recent Labs      06/09/14   1425   NA  144   K  4.1   CL  106   CO2  24   BUN  13   CREATININE  1.2   GLUCOSE  162*     No results found for this basename: PHOS       Recent Labs      06/09/14   1425   AST  313*   ALT  460*   BILIDIR  <0.2   BILITOT  0.3   ALKPHOS  135*     Lab Results   Component Value Date    LABALBU 4.5 06/09/2014      No results found for this basename: CHLPL, TRIG, HDL, LDLCALC, LDLDIRECT, LABVLDL     No results for input(s): LIPASE in the last 72 hours.  No results found for this basename: LABA1C     Lab Results   Component Value Date    WBC 6.5 06/09/2014    RBC 4.61 06/09/2014    HGB 14.3 06/09/2014    HCT 42.0 06/09/2014    MCV 91.0 06/09/2014    MCH 31.0 06/09/2014    MCHC 34.1 06/09/2014    RDW 13.6 06/09/2014    PLT 203 06/09/2014    MPV 9.5 06/09/2014     No results found for this basename: VITAMINB12      No results found for this basename: FOLATE     Continuous Medications:        Scheduled Medications:   ??? ziprasidone  20 mg Oral BID WC   ??? cloNIDine  0.1 mg Oral BID   ??? nicotine  1 patch Transdermal Daily   ??? thiamine  100 mg Oral Daily   ??? folic acid  1 mg Oral Daily      Anthropometric Measures   Height: 5\' 9"  (175.3 cm)   Current Weight: Weight: 143 lb (64.864 kg)    Admission weight: 143 lb (64.864 kg) stated  Usual Body Weight:  158- 160      Ideal Body Weight: 160  +/- 10%      Body mass index is 21.11 kg/(m^2).     18.5-24.9 Normal Weight      Patient Vitals for the past 96 hrs (Last 3 readings):   Weight   06/09/14 2040 143 lb (64.864 kg)     Wt Readings from Last 50 Encounters:   06/09/14 143 lb (64.864 kg)   06/09/14 143 lb (64.864  kg)   11/29/13 163 lb (73.936 kg)   04/24/13 164 lb (74.39 kg)   04/18/13 166 lb (75.297 kg)   03/05/11 172 lb (78.019 kg)   07/07/10 145 lb (65.772 kg)     Comparative Standards   Weight Used: 160 lb/ 73 kg  Estimated Calorie Needs: 2190- 2920 kcal (based on 30- 40 kcal/kg)  Estimated Protein Needs: 73- 106gm (based on 1.0- 1.5 gm/kg)  Estimated Fluid Needs: 2190 mL (based on 30 mL/kcal)  Physical Findings           Emesis / NG output:           Stool:           Skin:  Skin Integrity (WDL): Within Defined Limits           Edema: Peripheral Vascular (WDL): Within Defined Limits Edema: None               Chewing / Swallowing / Dentition:   no issues reported  Mental Status / Barriers:  no issues reported    Food/Nutrition-Related History  Pre-Admission / Home Diet:  Pre-Admission/Home Diet: General   Home Supplements / Herbals:    none noted  Food Restrictions / Cultural Requests:    none noted  Allergies as of 06/09/2014   ??? (No Known Allergies)     Diet Orders / Intake / Nutrition Support  Current diet/supplement order: DIET GENERAL;     EN/PN: n/a   Feeding Assistance:            Room Service: Selective   NSG Recorded PO:   last meal:       Pain does not affect PO intake.      NUTRITION DIAGNOSIS and GOAL  Problem: Inadequate energy intake   Etiology: related to Avoidance of proper oral intake  Signs/symptoms: as evidenced by : reported weight loss, drug and alcohol abuse    Goal: Consume 50% or more of meals   ?? Initiated: 7/7     ?? Progress: N/A       ?? Resolved: N/A      MALNUTRITION    Per AND/ASPEN guidelines, patient is at risk for malnutrition based on a history of weight loss and current intake. Per AND/ASPEN guidelines, will monitor for additional RD, RN and MD documentation re: weight status, nutritional intake, fluid accumulation, possible loss of body fat or muscle mass, or possible reduced grip strength.     Patient with moderate  nutrition risk found at this time.  Will reassess in 5 days. Consult dietitian if intervention is needed before that time.    Bernadene PersonSally J Kansas Spainhower, DTR  Contact Number:#[phone]

## 2014-06-10 NOTE — H&P (Signed)
H and P dictated  Spent at least 70 minutes with eval  Dx: Mood DO          Alcohol Abuse        Heroin Abuse    CINA, CIWA   Monitor for withdrawal.

## 2014-06-10 NOTE — Behavioral Health Treatment Team (Signed)
Sitting at his desk in his room eating food and drinking orange juice. Drowsy. Was able to recall drinking the day he came in "I don't think it was much, though". Ativan 1 mg and Tylenol 650 mg given.

## 2014-06-11 LAB — EKG 12-LEAD
Atrial Rate: 114 {beats}/min
P Axis: 73 degrees
P-R Interval: 114 ms
Q-T Interval: 318 ms
QRS Duration: 82 ms
QTc Calculation (Bazett): 438 ms
R Axis: 65 degrees
T Axis: 31 degrees
Ventricular Rate: 114 {beats}/min

## 2014-06-11 MED ORDER — IBUPROFEN 800 MG PO TABS
800 | Freq: Four times a day (QID) | ORAL | Status: DC | PRN
Start: 2014-06-11 — End: 2014-06-12
  Administered 2014-06-11 – 2014-06-12 (×2): 800 mg via ORAL

## 2014-06-11 MED ADMIN — vitamin B-1 (THIAMINE) tablet 100 mg: 100 mg | ORAL | @ 13:00:00 | NDC 00904054460

## 2014-06-11 MED ADMIN — haloperidol (HALDOL) tablet 5 mg: 5 mg | ORAL | @ 15:00:00 | NDC 00904592561

## 2014-06-11 MED ADMIN — nicotine (NICODERM CQ) 21 MG/24HR 1 patch: 1 | TRANSDERMAL | @ 13:00:00 | NDC 00067512609

## 2014-06-11 MED ADMIN — ziprasidone (GEODON) capsule 20 mg: 20 mg | ORAL | @ 22:00:00 | NDC 51079035101

## 2014-06-11 MED ADMIN — ziprasidone (GEODON) capsule 20 mg: 20 mg | ORAL | @ 13:00:00 | NDC 68180033107

## 2014-06-11 MED ADMIN — LORazepam (ATIVAN) tablet 1 mg: 1 mg | ORAL | @ 18:00:00 | NDC 51079038601

## 2014-06-11 MED ADMIN — folic acid (FOLVITE) tablet 1 mg: 1 mg | ORAL | @ 13:00:00 | NDC 51079010501

## 2014-06-11 MED ADMIN — LORazepam (ATIVAN) tablet 2 mg: 2 mg | ORAL | @ 15:00:00 | NDC 68084075411

## 2014-06-11 MED ADMIN — cloNIDine (CATAPRES) tablet 0.1 mg: 0.1 mg | ORAL | @ 13:00:00 | NDC 00904565661

## 2014-06-11 MED ADMIN — LORazepam (ATIVAN) tablet 2 mg: 2 mg | ORAL | @ 17:00:00 | NDC 68084075411

## 2014-06-11 MED ADMIN — cloNIDine (CATAPRES) tablet 0.1 mg: 0.1 mg | ORAL | @ 02:00:00 | NDC 00904565661

## 2014-06-11 MED FILL — IBUPROFEN 800 MG PO TABS: 800 MG | ORAL | Qty: 1

## 2014-06-11 MED FILL — ZIPRASIDONE HCL 20 MG PO CAPS: 20 MG | ORAL | Qty: 1

## 2014-06-11 MED FILL — LORAZEPAM 2 MG PO TABS: 2 MG | ORAL | Qty: 1

## 2014-06-11 MED FILL — TRAZODONE HCL 50 MG PO TABS: 50 MG | ORAL | Qty: 1

## 2014-06-11 MED FILL — CLONIDINE HCL 0.1 MG PO TABS: 0.1 MG | ORAL | Qty: 1

## 2014-06-11 MED FILL — TYLENOL 325 MG PO TABS: 325 MG | ORAL | Qty: 2

## 2014-06-11 MED FILL — FOLIC ACID 1 MG PO TABS: 1 MG | ORAL | Qty: 1

## 2014-06-11 MED FILL — VITAMIN B-1 100 MG PO TABS: 100 MG | ORAL | Qty: 1

## 2014-06-11 MED FILL — HALOPERIDOL 5 MG PO TABS: 5 MG | ORAL | Qty: 1

## 2014-06-11 MED FILL — LORAZEPAM 1 MG PO TABS: 1 MG | ORAL | Qty: 1

## 2014-06-11 MED FILL — NICODERM CQ 21 MG/24HR TD PT24: 21 MG/24HR | TRANSDERMAL | Qty: 1

## 2014-06-11 NOTE — Behavioral Health Treatment Team (Signed)
B/P 128/88 heart rate 112. Continues to complains of muscle cramping, anxiety and mild headache. Ativan 1 mg and tylenol 650 mg given.

## 2014-06-11 NOTE — Progress Notes (Signed)
Department of Psychiatry  Attending Progress Note  Chief Complaint: follow-up Patient feeling better this AM. He has been up and BP was lower later in day yesterday. Has not used much Ativan today and withdrawal appears to be less today per patient report. Not showing tremors and thoughts were organized and attentive.  Patient's chart was reviewed and collaborated with Child psychotherapistsocial worker about the treatment plan.  SUBJECTIVE:    Patient is feeling better. Suicidal ideation:  denies suicidal ideation.  Patient does not have medication side effects.    ROS: Patient has new complaints: no  Sleeping adequately:  Yes   Appetite adequate: Yes  Attending groups: Yes  Visitors:Yes    OBJECTIVE    Physical  VITALS:  BP 143/102    Pulse 107    Temp(Src) 97.1 ??F (36.2 ??C) (Oral)    Resp 14    Ht 5\' 9"  (1.753 m)    Wt 143 lb (64.864 kg)    BMI 21.11 kg/m2       Mental Status Examination:  Patients appearance was street clothes. Thoughts are Goal directed. Homicidal ideations none.  No abnormal movements, tics or mannerisms.  Memory intact Aims 0. Concentration Fair.   Alert and oriented X 4. Insight and Judgement normal insight and judgment. Patient was cooperative. Patient gait normal. Mood constricted and decreased range, affect depressed affect Hallucinations Absent, suicidal ideations no specific plan to harm self Speech normal volume  Data  Labs:   Admission on 06/09/2014   Component Date Value Ref Range Status   ??? Magnesium 06/10/2014 1.90  1.80 - 2.40 mg/dL Final   ??? Ammonia 40/98/119107/06/2014 50  16 - 60 umol/L Final            Medications  Current facility-administered medications:haloperidol (HALDOL) tablet 5 mg, 5 mg, Oral, Q4H PRN  haloperidol lactate (HALDOL) injection 5 mg, 5 mg, Intramuscular, Q4H PRN  ziprasidone (GEODON) capsule 20 mg, 20 mg, Oral, BID WC  traZODone (DESYREL) tablet 50 mg, 50 mg, Oral, Nightly PRN  cloNIDine (CATAPRES) tablet 0.1 mg, 0.1 mg, Oral, BID  acetaminophen (TYLENOL) tablet 650 mg, 650 mg, Oral,  Q4H PRN  hydrOXYzine (VISTARIL) capsule 50 mg, 50 mg, Oral, Q4H PRN  ziprasidone (GEODON) capsule 80 mg, 80 mg, Oral, Q4H PRN  magnesium hydroxide (MILK OF MAGNESIA) 400 MG/5ML suspension 30 mL, 30 mL, Oral, Daily PRN  famotidine (PEPCID) tablet 20 mg, 20 mg, Oral, BID PRN  nicotine (NICODERM CQ) 21 MG/24HR 1 patch, 1 patch, Transdermal, Daily  vitamin B-1 (THIAMINE) tablet 100 mg, 100 mg, Oral, Daily  folic acid (FOLVITE) tablet 1 mg, 1 mg, Oral, Daily  LORazepam (ATIVAN) tablet 1 mg, 1 mg, Oral, Q1H PRN  LORazepam (ATIVAN) tablet 2 mg, 2 mg, Oral, Q1H PRN  LORazepam (ATIVAN) tablet 3 mg, 3 mg, Oral, Q1H PRN  LORazepam (ATIVAN) tablet 4 mg, 4 mg, Oral, Q1H PRN    ASSESSMENT AND PLAN    Principal Problem:    Unspecified episodic mood disorder (HCC)  Active Problems:    Heroin abuse    Alcohol abuse       1.Patient s symptoms   are improving. Continue with CIWA and CINA precautions.   2.Probable discharge is next week  3.Discharge planning is complete  4 Suicidal ideation is better  5 total time 35 minutes

## 2014-06-11 NOTE — Behavioral Health Treatment Team (Signed)
138/95 heart rate 96. Attending group. Stated that he felt that the clonidine helped this morning. Informed him of the schedule and he will reassessed again around dinner time.

## 2014-06-11 NOTE — Progress Notes (Signed)
Pt CINA score 3; CIWA 3. Pt provided ibuprofen 800 mg , famotidine and trazadone.

## 2014-06-11 NOTE — Behavioral Health Treatment Team (Signed)
Approached patient while he was attending Art Therapy. Noted him anxiously tapping his foot. B/P 143/102 heart rate 107. Reported "I'm not feeling so well". Encouraged to return to room to ly down. Medicated with Ativan 2 mg and Tylenol 650 mg.

## 2014-06-11 NOTE — Progress Notes (Signed)
Pt was unable to attend group due to high blood pressure.    Lonia MadNancy Fitch, LISW-S  Child psychotherapistocial Worker

## 2014-06-11 NOTE — Progress Notes (Signed)
Time: 1900      Type of Group: recreation      Level of Participation: none       Comments: in room

## 2014-06-11 NOTE — Plan of Care (Signed)
Problem: Falls - Risk of  Goal: Absence of falls  Outcome: Met This Shift  No falls reported or observed this shift.          Problem: Altered Mood, Depressive Behavior  Goal: LTG-Able to verbalize and/or display a decrease in depressive symptoms  Outcome: Ongoing  Pt does appear to be feeling better from withdrawal symptoms.  Goal: STG-Absence of Self Harm  Outcome: Ongoing  No self harm reported or observed this shift.     Goal: STG-Knowledge of positive coping patterns  Outcome: Ongoing  Antwione is attempting to attend groups, but due to withdrawals is having to limit his time participating.    Comments:   KNIGHT OELKERS has been medication compliant. Patient has maintained his safety as evidenced by lack of harm to self or others.    KEYAAN LEDERMAN appears adequately groomed (attended to ADLs yesterday) with blunt affect. Patient's mood is noted as anxious. Writer notes his speech is appropriate. Morey's thought content and process is goal oriented and spontaneous. There are not perceptive disturbances reported or observed and patient has fair  impulse control, poor insight, and fair  judgement.    Collaborated with LPN and BHT on patient progress and presentation. Routine 15 minute checks completed throughout shift. Will continue to monitor.   [name], RN, 06/11/2014

## 2014-06-11 NOTE — Progress Notes (Signed)
This Probation officer met with pt and completed the psychosocial assessment, AT/OT and the CSSR-R assessment.  Pt denies current suicidal ideation and states that he feels safe here.  He states that he will tell someone if he feels unsafe.  Pt reports that he is currently on probation for an [place] since 02/2014.  He reports monthly and is supposed to report tomorrow.  He states that he is supposed to have followed up for treatment with the [facility] but he has not done so yet because of work.  Pt states that he did a 28 day inpatient rehab treatment program about one year ago when he lived in [place] with his mother and his sister.  He states that when he got out of the program, however, he started using again.  He states that when he used prior to coming into the hospital this time he had been clean for 7 months except for his alcohol use.  Pt was in [place] for a few months to get away from [place] as that is where his mom and sister now live.  Pt reports that his two brothers and his father are all deceased.  One brother shot himself in 2008/02/02, one brother was a substance user and he overdosed (pt states he killed himself as he had said he was suicidal) in 2007/02/01, and his father died from withdrawal symptoms from opiates in 02-01-2010.  Pt states that it didn't work out for him in [place] so he came back here to [place] about a year ago and he met his girlfriend who he states is a good support person for him.  This Probation officer provided pt the list of drug rehab program for him to go over and review.  He states that he doesn't know what he is going to do that he thinks he will have to talk to his probation officer.    Phillis Haggis, LISW-S  Education officer, museum

## 2014-06-11 NOTE — Behavioral Health Treatment Team (Signed)
Upon entering room patient began sitting up. "I was just coming to talk with you". Notable anxiety. C/O "my legs are really achy and twitchy. Complaints of headache. Palms are moist but not visibly sweating. C/O "my stomach is jumpy". Medicated per CIWA score.

## 2014-06-11 NOTE — Behavioral Health Treatment Team (Signed)
Comments: Some + interactions, some contribution, and moderate engagement.      Time: 2030      Type of Group: Wrap-Up      Level of Participation: Moderate, stayed for most of the group.

## 2014-06-11 NOTE — Progress Notes (Signed)
Brett [name] attempted to attend art therapy/psych ed group today. He arrived on time and was motivated to engage in art therapy directive. Upon nursing staff assessing Brett [name]'s blood pressure and pulse, he was encouraged to return to bed and rest.    Lidia Collum, MS, ATR-BC

## 2014-06-11 NOTE — Behavioral Health Treatment Team (Signed)
Comments: Patient did not attend group    Time: 1415-1445      Type of Group: Wellness group      Level of Participation: Did not attend

## 2014-06-11 NOTE — Behavioral Health Treatment Team (Signed)
Treatment Team   ELOS: 1-3 days  Plan: safe detox; groups  Goal: "go to groups"

## 2014-06-12 LAB — COMPREHENSIVE METABOLIC PANEL
ALT: 508 U/L — ABNORMAL HIGH (ref 10–40)
AST: 328 U/L — ABNORMAL HIGH (ref 15–37)
Albumin/Globulin Ratio: 1.4 (ref 1.1–2.2)
Albumin: 4.2 g/dL (ref 3.4–5.0)
Alkaline Phosphatase: 128 U/L (ref 40–129)
Anion Gap: 9 (ref 3–16)
BUN: 14 mg/dL (ref 7–20)
CO2: 31 mmol/L (ref 21–32)
Calcium: 9.4 mg/dL (ref 8.3–10.6)
Chloride: 99 mmol/L (ref 99–110)
Creatinine: 1 mg/dL (ref 0.9–1.3)
GFR African American: >60 (ref >60)
GFR Non-African American: >60 (ref >60)
Globulin: 3 g/dL
Glucose: 108 mg/dL — ABNORMAL HIGH (ref 70–99)
Potassium: 5.2 mmol/L — ABNORMAL HIGH (ref 3.5–5.1)
Sodium: 139 mmol/L (ref 136–145)
Total Bilirubin: 0.5 mg/dL (ref 0.0–1.0)
Total Protein: 7.2 g/dL (ref 6.4–8.2)

## 2014-06-12 MED ORDER — TRAZODONE HCL 50 MG PO TABS
50 MG | ORAL_TABLET | Freq: Every evening | ORAL | Status: AC | PRN
Start: 2014-06-12 — End: ?

## 2014-06-12 MED ORDER — CLONIDINE HCL 0.1 MG PO TABS
0.1 MG | ORAL_TABLET | Freq: Two times a day (BID) | ORAL | Status: AC
Start: 2014-06-12 — End: ?

## 2014-06-12 MED ORDER — BENZTROPINE MESYLATE 1 MG PO TABS
1 MG | ORAL_TABLET | Freq: Two times a day (BID) | ORAL | Status: DC | PRN
Start: 2014-06-12 — End: 2017-06-27

## 2014-06-12 MED ORDER — NICOTINE POLACRILEX 4 MG MT GUM
4 MG | OROMUCOSAL | Status: DC | PRN
Start: 2014-06-12 — End: 2014-06-12
  Administered 2014-06-12 (×2): 4 mg via ORAL

## 2014-06-12 MED ADMIN — vitamin B-1 (THIAMINE) tablet 100 mg: 100 mg | ORAL | @ 13:00:00 | NDC 00904054460

## 2014-06-12 MED ADMIN — folic acid (FOLVITE) tablet 1 mg: 1 mg | ORAL | @ 13:00:00 | NDC 51079010501

## 2014-06-12 MED ADMIN — famotidine (PEPCID) tablet 20 mg: 20 mg | ORAL | @ 01:00:00 | NDC 51079096601

## 2014-06-12 MED ADMIN — ziprasidone (GEODON) capsule 20 mg: 20 mg | ORAL | @ 13:00:00 | NDC 51079035101

## 2014-06-12 MED ADMIN — cloNIDine (CATAPRES) tablet 0.1 mg: 0.1 mg | ORAL | @ 01:00:00 | NDC 00904565661

## 2014-06-12 MED ADMIN — benztropine (COGENTIN) 1 MG tablet: 2 | ORAL | @ 14:00:00 | NDC 00603243421

## 2014-06-12 MED ADMIN — traZODone (DESYREL) tablet 50 mg: 50 mg | ORAL | @ 01:00:00 | NDC 00904399061

## 2014-06-12 MED ADMIN — cloNIDine (CATAPRES) tablet 0.1 mg: 0.1 mg | ORAL | @ 14:00:00 | NDC 00904565661

## 2014-06-12 MED FILL — NICOTINE POLACRILEX 4 MG MT GUM: 4 MG | OROMUCOSAL | Qty: 1

## 2014-06-12 MED FILL — ZIPRASIDONE HCL 20 MG PO CAPS: 20 MG | ORAL | Qty: 1

## 2014-06-12 MED FILL — BENZTROPINE MESYLATE 1 MG PO TABS: 1 MG | ORAL | Qty: 2

## 2014-06-12 MED FILL — VITAMIN B-1 100 MG PO TABS: 100 MG | ORAL | Qty: 1

## 2014-06-12 MED FILL — IBUPROFEN 800 MG PO TABS: 800 MG | ORAL | Qty: 1

## 2014-06-12 MED FILL — FAMOTIDINE 20 MG PO TABS: 20 MG | ORAL | Qty: 1

## 2014-06-12 MED FILL — CLONIDINE HCL 0.1 MG PO TABS: 0.1 MG | ORAL | Qty: 1

## 2014-06-12 MED FILL — FOLIC ACID 1 MG PO TABS: 1 MG | ORAL | Qty: 1

## 2014-06-12 NOTE — Discharge Instructions (Signed)
Discharge Planning is Complete.    Discharge Date: 06/12/2014  Reason for Hospitalization: + S/I  Discharge Diagnosis: Mood disorder    Your instructions:  Your physician here was Elberta FortisLarry A Graham, MD. If you have any questions please call the unit at [phone].    The Crisis Number for The crisis number for [facility] is 281-CARE. This crisis line is available 24 hours a day, seven days a week.    [facility] were notified of your admission.    Your next appointment is:   Patient was given a Rehab list   You are referred to:  [facility]  [place] Locations:   . [facility]: [address], [place] [phone]   . [address]: [address], [place] [phone]   . [address], [place] [phone]   . [facility]: [address], [place] [phone]     Walk-In Times:   . 8:30 am to 3:30 pm, Monday through Friday (except holidays)     Please bring the following documents with you:   . State ID or drivers' license   . Social Security Card   . Medicaid or other insurance card   . Proof of income, such as a bank statement, pay stub, or W-2   . Proof of residency, such as a utility bill   . Proof of major medical expenses, child support, or alimony (if applicable)   . Custody or guardianship documents (for youth under 18 or adults with a legal guardian)*   . Birth certificate (for youth under 18)     Please anticipate your first appointment taking approximately 2.5 hours.     Other appointments:   You are being referred to [facility] for substance abuse treatment.   [facility] is located at [address], [place], [place] [id number]. [facility] offers walk-in registration Monday 8:30am-12:00 pm, Tuesday 8:30am-5:00pm, and Wednesday 1:00pm-5:00pm.   Please bring the following with you to register: a photo i.d., proof of residency in [facility], proof of household income and this paperwork.  Please call [facility] at [phone] if you have any questions about the registration  process.      Jenetta LogesCathy Charon Smedberg   [phone]    Fax to:   Linton Rumpalbert [phone]  [facility] at [phone]    The following personal items were collected during your admission and were returned to you:    Valuables  Dentures: None  Vision - Corrective Lenses: Glasses (on patient)  Hearing Aid: None  Jewelry: Ring (On patient)  Clothing: Pants, Socks, Undergarments (Comment), Footwear (all in locker)  Were All Patient Medications Collected?: No  Other Valuables: Other (Comment), Cell phone, Keys, Wallet [facility] check card, lighter & cigarettes in safe on unit. Cell phone & charger in locker)  Valuables Given To: Venida JarvisLocker (2 keys & 9 cents , belt & wallet with chain in locker)      By signing below, I understand and acknowledge receipt of the instructions indicated above.

## 2014-06-12 NOTE — Progress Notes (Signed)
This Clinical research associatewriter faxed note to pts probation officer that he was in the hospital and that he was not going to make his appointment with her today at 12pm.  I put the note and the confirmation that the fax went through into pts chart.    Lonia MadNancy Fitch, LISW-S  Child psychotherapistocial Worker

## 2014-06-12 NOTE — Progress Notes (Signed)
Pt attempted to join psychotherapy group, however, he had to leave due to his jaw locking up.  So he had to leave so that he could go get medical attention.    Lonia Mad, LISW-S  Child psychotherapist

## 2014-06-12 NOTE — Behavioral Health Treatment Team (Signed)
Treatment Team   ELOS: discharge today  Plan: call rehabs  Goal: "try to make it to all groups"

## 2014-06-12 NOTE — Plan of Care (Signed)
Problem: Altered Mood, Depressive Behavior  Goal: LTG-Able to verbalize and/or display a decrease in depressive symptoms  Outcome: Ongoing  S: "My goal was to try to go to all the groups."  O: Visible on the unit and attending group activities. Affect blunted. Eye contact direct. Pleasant on approach.  A: No current suicidal ideation noted. Pt came out of group c/o tightness in his jaws. Drooling and jaws clenched at times. MD notified and new order for Cogentin 2mg  po stat given.  P: Continue as formulated. 1:1 interactions to administer meds, encourage group attendance and monitor for signs/symptoms of withdrawal as well as resolving EPS.

## 2014-06-12 NOTE — Plan of Care (Signed)
Problem: Substance Abuse  Goal: STG-Knowledge of positive coping patterns  Outcome: Not Met This Shift  Encouraged to go to evening group.  He told Probation officer the "lady who gave me Ativan around noon told me I didn't have to go to group if I'm sick."  He was told this is now and he needs to be in there which he did for a few minutes.  Given Advil for body aches, Trazodone and Pepcid 2130.

## 2014-06-12 NOTE — Progress Notes (Signed)
Patients chart was reviewed and collaborated with staff as well around discharge planning.  Reviewed with the patient.  Dictated discharge summary.  At this time patient is not probateable or holdable and is not suicidal/homicidal. Spent 35 minutes on discharge  Patient had a dystonic rxn today with jaw tightening  . Resolved quickly with Cogentin 2 mg PO. Will dc Geodon and Haldol and give cogentin prn at DC.

## 2014-06-12 NOTE — Behavioral Health Treatment Team (Signed)
[facility]  Discharge Note    Pt discharged with followings belongings:   Dentures: None  Vision - Corrective Lenses: Glasses (on patient)  Hearing Aid: None  Jewelry: Ring (On patient)  Clothing: Pants, Socks, Undergarments (Comment), Footwear (all in locker)  Were All Patient Medications Collected?: No  Other Valuables: Other (Comment), Cell phone, Keys, Wallet [facility] check card, lighter & cigarettes in safe on unit. Cell phone & charger in locker)    Valuables retrieved and returned to patient.  Patient left department with Departure Mode: By self via Mobility at Departure: Ambulatory, discharged to Discharged to: Private Residence. Patient education on aftercare instructions: Yes   Patient verbalize understanding of AVS:  Yes.    Status EXAM upon discharge:  Status and Exam  Normal: Yes  Facial Expression: Brightened  Affect: Blunt  Level of Consciousness: Alert  Mood:Normal: No  Mood: Anxious  Motor Activity:Normal: Yes  Interview Behavior: Cooperative  Preception: Orient to Person, Orient to Time, Orient to Place, Orient to Situation  Attention:Normal: Yes  Thought Content:Normal: Yes  Hallucinations: None  Delusions: No  Memory:Normal: Yes  Insight and Judgment: Yes  Insight and Judgment:  (IMPROVING)  Present Suicidal Ideation: No  Present Homicidal Ideation: No    Serita Grammes, RN

## 2014-07-01 ENCOUNTER — Inpatient Hospital Stay
Admission: EM | Admit: 2014-07-01 | Discharge: 2014-07-03 | Disposition: A | Discharge status: Home / Self Care | Payer: PRIVATE HEALTH INSURANCE | Admitting: Psychiatry

## 2014-07-01 DIAGNOSIS — F3130 Bipolar disorder, current episode depressed, mild or moderate severity, unspecified: Secondary | ICD-10-CM

## 2014-07-01 MED ORDER — diphenhydrAMINE (BENADRYL) 50 mg/mL injection
50 | INTRAMUSCULAR | Status: AC
Start: 2014-07-01 — End: 2014-07-01
  Administered 2014-07-01: 19:00:00 50 via INTRAMUSCULAR

## 2014-07-01 MED ORDER — ziprasidone (GEODON) injection 20 mg
20 | Freq: Once | INTRAMUSCULAR | Status: AC
Start: 2014-07-01 — End: 2014-07-01

## 2014-07-01 MED ORDER — ziprasidone (GEODON) 20 mg/mL (final conc.) injection
20 | INTRAMUSCULAR | Status: AC
Start: 2014-07-01 — End: 2014-07-01
  Administered 2014-07-01: 19:00:00 20 via INTRAMUSCULAR

## 2014-07-01 MED ORDER — diphenhydrAMINE (BENADRYL) injection 50 mg
50 | Freq: Once | INTRAMUSCULAR | Status: AC
Start: 2014-07-01 — End: 2014-07-01

## 2014-07-01 MED ORDER — sterile water (PF) Soln
INTRAMUSCULAR | Status: AC
Start: 2014-07-01 — End: 2014-07-02

## 2014-07-01 MED FILL — DIPHENHYDRAMINE 50 MG/ML INJECTION SOLUTION: 50 50 mg/mL | INTRAMUSCULAR | Qty: 1

## 2014-07-01 MED FILL — GEODON 20 MG/ML (FINAL CONCENTRATION) INTRAMUSCULAR SOLUTION: 20 20 mg/mL (final conc.) | INTRAMUSCULAR | Qty: 20

## 2014-07-01 MED FILL — WATER FOR INJECTION, STERILE INJECTION SOLUTION: INTRAMUSCULAR | Qty: 10
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
## 2014-07-01 NOTE — Unmapped (Signed)
Asleep, breathing easily.  Will continue to monitor per P[facility] protocol for safety.

## 2014-07-01 NOTE — Unmapped (Signed)
Pt is unable to be interviewed at this time d/t being intoxicated. Overnight shift SW will f/u assessment and dispo.     Lillia Abed Cueto-Delgado LISW-S

## 2014-07-01 NOTE — Unmapped (Signed)
Sleeping soundly.  Did not respond to verbal stimuli in normal tone of voice.  Respirations even and unlabored.

## 2014-07-01 NOTE — Unmapped (Signed)
Asleep on stretcher.  Respirations even and unlabored.

## 2014-07-01 NOTE — Unmapped (Signed)
Admit to OTA.  Personal effects searched, inventoried and stored per PES/Bed bug policy.  He was acutely violent towards staff (see triage note) and placed in 6 point restraints.  Will monitor per [facility] protocol for restraints and then for safety.

## 2014-07-02 LAB — CBC
Hematocrit: 43 % (ref 38.5–50.0)
Hemoglobin: 14.8 g/dL (ref 13.2–17.1)
MCH: 30.8 pg (ref 27.0–33.0)
MCHC: 34.3 g/dL (ref 32.0–36.0)
MCV: 89.7 fL (ref 80.0–100.0)
MPV: 9.8 fL (ref 7.5–11.5)
Platelets: 205 10*3/uL (ref 140–400)
RBC: 4.79 10*6/uL (ref 4.20–5.80)
RDW: 13.9 % (ref 11.0–15.0)
WBC: 6.6 10*3/uL (ref 3.8–10.8)

## 2014-07-02 LAB — TREPONEMA PALLIDUM AB WITH REFLEX: Treponema Pallidum: NEGATIVE

## 2014-07-02 LAB — URINE DRUG SCREEN WITHOUT CONFIRMATION, STAT
Amphetamine, 500 ng/mL Cutoff: NEGATIVE
Barbiturates UR, 300  ng/mL Cutoff: NEGATIVE
Benzodiazepines UR, 300 ng/mL Cutoff: NEGATIVE
Cocaine UR, 300 ng/mL Cutoff: POSITIVE — AB
MDMA, 500 ng/mL Cutoff: NEGATIVE
Methadone, UR, 300 ng/mL Cutoff: NEGATIVE
Opiates UR, 300 ng/mL Cutoff: NEGATIVE
Oxycodone, 100 ng/mL Cutoff: NEGATIVE
THC UR, 50 ng/mL Cutoff: NEGATIVE
Tricyclic Antidepressants, 300 ng/mL Cutoff: NEGATIVE

## 2014-07-02 LAB — HEPATIC FUNCTION PANEL
ALT: 336 U/L — ABNORMAL HIGH (ref 7–52)
AST: 272 U/L — ABNORMAL HIGH (ref 13–39)
Albumin: 4 g/dL (ref 3.5–5.7)
Alkaline Phosphatase: 90 U/L (ref 36–125)
Bilirubin, Direct: 0.12 mg/dL (ref 0.00–0.40)
Bilirubin, Indirect: 0.38 mg/dL (ref 0.00–1.10)
Total Bilirubin: 0.5 mg/dL (ref 0.0–1.5)
Total Protein: 6.7 g/dL (ref 6.4–8.9)

## 2014-07-02 LAB — TSH: TSH: 0.17 u[IU]/mL — ABNORMAL LOW (ref 0.34–5.60)

## 2014-07-02 LAB — URINALYSIS W/RFL TO MICROSCOPIC
Bilirubin, UA: NEGATIVE
Blood, UA: NEGATIVE
Glucose, UA: 150 mg/dL — AB
Ketones, UA: NEGATIVE mg/dL
Leukocyte Esterase, UA: NEGATIVE
Nitrite, UA: NEGATIVE
Protein, UA: NEGATIVE mg/dL
Specific Gravity, UA: 1.013 (ref 1.005–1.035)
Urobilinogen, UA: <2 mg/dL (ref 0.2–1.9)
pH, UA: 6 (ref 5.0–8.0)

## 2014-07-02 LAB — RENAL FUNCTION PANEL W/EGFR
Albumin: 4 g/dL (ref 3.5–5.7)
Anion Gap: 8 mmol/L (ref 3–16)
BUN: 14 mg/dL (ref 7–25)
CO2: 25 mmol/L (ref 21–33)
Calcium: 9 mg/dL (ref 8.6–10.3)
Chloride: 107 mmol/L (ref 98–110)
Creatinine: 1.07 mg/dL (ref 0.60–1.30)
GFR MDRD Af Amer: 102 See note.
GFR MDRD Non Af Amer: 84 See note.
Glucose: 86 mg/dL (ref 70–100)
Osmolality, Calculated: 290 mosm/kg (ref 278–305)
Phosphorus: 2.5 mg/dL (ref 2.1–4.7)
Potassium: 4.2 mmol/L (ref 3.5–5.3)
Sodium: 140 mmol/L (ref 133–146)

## 2014-07-02 LAB — VITAMIN B12: Vitamin B-12: 424 pg/mL (ref 180–914)

## 2014-07-02 LAB — FOLATE: Folic Acid: 11.3 ng/mL (ref 5.90–24.80)

## 2014-07-02 MED ORDER — ondansetron (ZOFRAN) 4 MG tablet
4 | ORAL | Status: AC
Start: 2014-07-02 — End: 2014-07-02
  Administered 2014-07-02: 23:00:00 4 via ORAL

## 2014-07-02 MED ORDER — loperamide (IMODIUM) capsule 2 mg
2 | ORAL | Status: AC | PRN
Start: 2014-07-02 — End: 2014-07-03
  Administered 2014-07-02 – 2014-07-03 (×3): 2 mg via ORAL

## 2014-07-02 MED ORDER — ziprasidone (GEODON) injection 20 mg
20 | INTRAMUSCULAR | Status: AC | PRN
Start: 2014-07-02 — End: 2014-07-03

## 2014-07-02 MED ORDER — nicotine (NICODERM CQ) 21 mg/24 hr 1 patch
21 | Freq: Every day | TRANSDERMAL | Status: AC
Start: 2014-07-02 — End: 2014-07-03
  Administered 2014-07-02 – 2014-07-03 (×2): 1 via TRANSDERMAL

## 2014-07-02 MED ORDER — lamoTRIgine (LAMICTAL) tablet 25 mg
25 | Freq: Every day | ORAL | Status: AC
Start: 2014-07-02 — End: 2014-07-03
  Administered 2014-07-02 – 2014-07-03 (×2): 25 mg via ORAL

## 2014-07-02 MED ORDER — ibuprofen (ADVIL,MOTRIN) tablet 800 mg
800 | Freq: Once | ORAL | Status: AC
Start: 2014-07-02 — End: 2014-07-02
  Administered 2014-07-02: 06:00:00 800 mg via ORAL

## 2014-07-02 MED ORDER — ibuprofen (ADVIL,MOTRIN) tablet 400 mg
400 | Freq: Four times a day (QID) | ORAL | Status: AC | PRN
Start: 2014-07-02 — End: 2014-07-03
  Administered 2014-07-02 – 2014-07-03 (×3): 400 mg via ORAL

## 2014-07-02 MED ORDER — nicotine (polacrilex) (NICORETTE/NICORELIEF) gum 4 mg
2 | BUCCAL | Status: AC | PRN
Start: 2014-07-02 — End: 2014-07-03
  Administered 2014-07-03 (×2): 4 mg via ORAL

## 2014-07-02 MED ORDER — cloNIDine HCl (CATAPRES) tablet 0.1 mg
0.1 | Freq: Three times a day (TID) | ORAL | Status: AC | PRN
Start: 2014-07-02 — End: 2014-07-03
  Administered 2014-07-02 – 2014-07-03 (×2): 0.1 mg via ORAL

## 2014-07-02 MED ORDER — ondansetron (ZOFRAN) 4 mg/2 mL injection 4 mg
4 | Freq: Three times a day (TID) | INTRAMUSCULAR | Status: AC | PRN
Start: 2014-07-02 — End: 2014-07-03

## 2014-07-02 MED ORDER — LORazepam (ATIVAN) tablet 2 mg
1 | ORAL | Status: AC | PRN
Start: 2014-07-02 — End: 2014-07-03
  Administered 2014-07-02 – 2014-07-03 (×4): 2 mg via ORAL

## 2014-07-02 MED ORDER — risperiDONE M-TAB (RISPERDAL M-TABS) disintegrating tablet 2 mg
2 | Freq: Two times a day (BID) | ORAL | Status: AC
Start: 2014-07-02 — End: 2014-07-03
  Administered 2014-07-02 – 2014-07-03 (×2): 2 mg via ORAL

## 2014-07-02 MED ORDER — bismuth subsalicylate (PEPTO BISMOL) 262 mg/15 mL suspension 30 mL
262 | ORAL | Status: AC | PRN
Start: 2014-07-02 — End: 2014-07-03

## 2014-07-02 MED ORDER — ondansetron (ZOFRAN) tablet 4 mg
4 | Freq: Three times a day (TID) | ORAL | Status: AC | PRN
Start: 2014-07-02 — End: 2014-07-03

## 2014-07-02 MED ORDER — hydrOXYzine pamoate (VISTARIL) capsule 25 mg
25 | ORAL | Status: AC | PRN
Start: 2014-07-02 — End: 2014-07-02

## 2014-07-02 MED FILL — CLONIDINE HCL 0.1 MG TABLET: 0.1 0.1 MG | ORAL | Qty: 1

## 2014-07-02 MED FILL — RISPERIDONE 2 MG DISINTEGRATING TABLET: 2 2 MG | ORAL | Qty: 1

## 2014-07-02 MED FILL — LOPERAMIDE 2 MG CAPSULE: 2 2 mg | ORAL | Qty: 1

## 2014-07-02 MED FILL — IBUPROFEN 800 MG TABLET: 800 800 MG | ORAL | Qty: 1

## 2014-07-02 MED FILL — NICOTINE 21 MG/24 HR DAILY TRANSDERMAL PATCH: 21 21 mg/24 hr | TRANSDERMAL | Qty: 1

## 2014-07-02 MED FILL — LORAZEPAM 1 MG TABLET: 1 1 MG | ORAL | Qty: 2

## 2014-07-02 MED FILL — IBUPROFEN 400 MG TABLET: 400 400 MG | ORAL | Qty: 1

## 2014-07-02 MED FILL — LAMOTRIGINE 25 MG TABLET: 25 25 MG | ORAL | Qty: 1

## 2014-07-02 MED FILL — ONDANSETRON HCL 4 MG TABLET: 4 4 MG | ORAL | Qty: 1

## 2014-07-02 NOTE — Unmapped (Signed)
Occupational Therapy/Therapeutic Recreation Progress Note      Alexander [name]  [id number]      Group: Task  Attendance: Excused  0/75 minutes  Level of Participation: None    Pt was sleeping when approached to invite to group.    Thea Gist, CTRS  07/02/2014 3:57 PM
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
## 2014-07-02 NOTE — Unmapped (Signed)
Problem: Major Mood Disorder  Goal: Mania or depressed mood won???t interfere with daily function  Intervention: Document symptoms in progress notes  Daily mse
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
## 2014-07-02 NOTE — Unmapped (Signed)
Problem: Major Thought Disorder  Goal: Thought disorder will not interfere with daily functioning  Intervention: Assess for target symptoms & provide for safety  Assess for target symptoms and provide for safety   Pt. Has been resting in bed @ intervals.told nurse was having some withdrawal symptoms. Having some diarrhea shakes and headache.states he is having withdrawls from alcohol abuse.  Intervention: Dispense prescribed medication per MD order  Received motrin 400 mg for ha pain,immodium 2 mg po @ 1140, results pending.
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
## 2014-07-02 NOTE — Unmapped (Signed)
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 If he could no he was going to tear up some things.can not find his glasses.speech pressured and angry. In hospital clothing. Talked with nurse practioner and order given and received to give Risperdal 2 mg and ativan 2 mg po.  Intervention: Dispense prescribed medication per MD order  Compliant with morning  medication  Intervention: Address personal hygiene and care  Hygiene products at bedside.encourgaed to shower.
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
## 2014-07-02 NOTE — Unmapped (Signed)
Problem: Violent Threat of Harming Others  Goal: PATIENT WILL NO LONGER WANT TO HARM OTHERS  Intervention: Violence Towards Others  Patient CFS      Problem: Major Thought Disorder  Goal: Thought disorder will not interfere with daily functioning  Intervention: Assess for target symptoms & provide for safety  Assess for target symptoms and provide for safety  Patient CFS      Problem: Major Mood Disorder  Goal: Mania or depressed mood won???t interfere with daily function  Intervention: Assess for target symptoms & provide for safety  Assess for target symptoms and provide for safety  Patient CFS      Problem: Potential for Harm to Self or Others  Goal: Patient will not present active danger to themselves due to  patient will not present an active danger to themselves due to a psych  illness  Intervention: Assess for target symptoms & provide for safety  Assess for target symptoms and provide for safety  Patient CFS    Goal: Patient will not present an active danger to others due to a  Intervention: Assess for target symptoms & provide for safety  Assess for target symptoms and provide for safety  Patient CFS
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
## 2014-07-02 NOTE — Unmapped (Signed)
Urine samples sent.

## 2014-07-02 NOTE — Unmapped (Signed)
Problem: Violent Threat of Harming Others  Goal: PATIENT WILL NO LONGER WANT TO HARM OTHERS  Outcome: Progressing  Denies SI/HI  Intervention: Administer medications as ordered  Medication compliant  Intervention: Violence Towards Others  Social with peers/staff, no aggressive behaviors noted

## 2014-07-02 NOTE — Unmapped (Signed)
[facility]                                    Social Work Inpatient Psychiatry Summary      Patient name: Alexander [name]     Patient MRN: [id number]  DOB: [birth year]  Age: 28 y.o.   Gender: male      Date of admission: 07/01/2014    Circumstances leading to hospitalization: Pt is a 28 yo Caucasian male with a history of mood disorder and heroin / alcohol abuse who presents to [facility] via police for SI. Pt states I can't remember what happened. I said something to my girlfriend. I'm an alcoholic.    Admission Information: Pt born and raised locally. One brother committed suicide in Apr 30, 2007 and one brother committed suicide in 2008-04-29; one by heroin OD and the other by GSW. Father died in Apr 29, 2010. Mother and sister moved to [place] following father's death. Pt currently lives with girlfriend [name] and her three teenage girls. Pt has history of hospitalizations from suicide attempts and history of legal charges. Pt dropped out of school in 12th grade. Pt reports being fired last week from job working at [employer], seasonally. History of drug use. Current probation violation for DUI charges, next court date August 10th. Mandatory treatment through probation and has been to one meeting at the [facility] to connect for services.     Pt asking for assistance in obtaining his glasses. Reports losing them in the back of police car. Provided [place] police department number. Pt called and found glasses were given to pt's girlfriend, [name]. [name] is unable to reached at this time due to number not being in service. Pt has bed bugs and belongings are sealed in soil room, unable to retrieve additional phone numbers in wallet.         Patient Information     Why are you here?: I'm an alcoholic and I said something to my girlfriend which I can't remember    Precipitant for Admission: Suicidal Ideation/Act    How do you wish to be addressed?: [name]    Current Mental Status: Awake    Guardian  Type: None    Name of Guardian: n/a    Number of children and their names: none, but lives with girlfriend's 3 children aged 63, 17, and 68     Patient's Sexual Orientation: Heterosexual    Gender Identity: Male    Any aspects of your orientation that will help [place] provide better care for you: none    Patient Education Level: 9-11 Grade (dropped out in 12th grade)    Patient Income Source: Family (tries to work, was fired from [employer])    Support System: Significant other      Safety         Self Injurious Thoughts: Denies    Self Injurious Behaviors: None observed (history of suicide attempts; OD, hanging, heroin)    Thoughts of Harming Others: Denies    Harmful Actions Toward Others: None observed    Any safety concerns: pt has history of serious suicide attempts; in January 2015 OD on tylenol and percocet in which pt was in medical for few days; heroin OD 3 weeks ago needing narcan     Support System          Marital Status: Significant Other/Life Partner  Significant Other: [name]    Other Support, Name and relationship:  none    May We Obtain Collateral Information From Family, Friends and Neighbors?: Yes, with reservations (Comment) (girlfriend)    Mental Health Agency: [facility] - only gone to Open Enrollment once     Psychiatrist: not yet assigned      Legal Status             Patient Mental Health Legal Status: Involuntary    What is your current criminal legal status?: Probation;History of incarceration    Name & number of parole officer: not provided    Current Legal Charges: pt is currently on probation violation; DUI charges    Court date for charges: 07/14/14    Legal History: hx of incarceration on weapon charges    Alcohol/Drug History Past 12 Months      Are you currently using drugs/alcohol?: Yes           Referral to CD?: Yes    CD History: relapse on heroin 3 weeks ago, history of cocaine, heroin, and MJ, reports being an alcoholic       Abuse/Trauma History         Abuse  History: Denies    Is anyone in the home being abused or neglected?: No     Trauma/Loss History: denies any history of abuse; pt's father died in 2010/05/26, both brothers have committed suicide: one by GSW and the other by heroin OD in 2007/05/27 and May 26, 2008     Spiritual             Spiritual/Cultural Requests: No     Cultural            Primary Language: English    Religious/Cultural Factors: Caucasian Male     Collateral Information          Collateral Information: pt gives permission to speak to gf [name]. Unable to reach due to number in chart being not in service.     Formulation of Plan          Formulation of Plan: SW continue to follow. Maintain safety and stabilization. Obtain collateral from family/ friends. Discharge planning.

## 2014-07-02 NOTE — Unmapped (Signed)
Awake, alert, making appropriate requests.  Now sober.  States he is missing his eyeglasses and believes the police left them in their squad car.  States that is why he was upset earlier.

## 2014-07-02 NOTE — Unmapped (Signed)
[facility]  Chemical Dependency Consultation       Patient name: Alexander [name]  DOB: 1986/02/05  MRN: [id number]    Alcohol/Drug History in past 12 months   Have you used any substance (prescription drugs, non prescription drugs,  inhalants, bath salts, etc.) other than directed or abused alcohol in the past 12 months?: Yes    Illegal drug use/abuse past 12 months: Yes    Prescription drug abuse 12 months: Yes    Non-prescription abuse in last 12 months: Yes    Any alcohol abuse in last 12 months: Yes    IV DRUG USE: Yes    Used chemicals (other than as prescribed) in the past 12 months?: Yes          Chemicals Used:       Type of Other Chemical Used: Alcohol       Type of Other Chemical Used: Heroin                                                                    Current Alcohol and drug treatment history: OP    Current Treatment Facility: [facility]    Past alcohol/drug treatment programs: Hospital;OP         Any family history of substance abuse problems?: 2 Brothers    Toxicology screen completed?: Yes (Make comment) (Per PES)    Other factors: IV drug user    Other Addictive Behaviors: ETOH/Heroin 3x/wk, MJ    Legal Information  What is your current criminal legal status?: Probation                        Strengths and needs for treatment      Treatment Recommendations and needs  See progress note.

## 2014-07-02 NOTE — Unmapped (Signed)
[facility] Services: Hepatic Dosing    Lab Results   Component Value Date    ALT 336* 07/02/2014    AST 272* 07/02/2014    ALKPHOS 90 07/02/2014    BILITOT 0.5 07/02/2014     Pt's LFTs show marked elevation.  Recent INR unavailable.  Hep C diagnosis.    Recommendations:  - Lamotrigine doses should be reduced by 25%  - Please consider lower initial dose of risperidone and slow titration. Recommend 0.5-1mg  BID    Cherly Anderson, PharmD, [place]  [phone]  Pager: [phone]

## 2014-07-02 NOTE — Unmapped (Signed)
[facility]    Informed Consent for Medication    [ Yes ] This Thereasa Parkin has counseled Alexander [name] on the following:     - Provided the patient/guardian the opportunity to discuss medications and ask questions regarding medication therapy.   - Discussed the risks and benefits of each medication with the patient/guardian.   - Explained the most common side effects of each medication to the patient/guardian.   - Explained the risks of neuroleptic malignant syndrome and/or tardive dyskinesia to the patient/guardian, if they apply.   - Encouraged the patient/guardian to report any side effects to the physician, nurse, or other staff member.   - Presented appropriate treatment alternatives to the patient/guardian.      [ Yes ]  This Thereasa Parkin has assessed Alexander [name] for the following:     - Patient/guardian understands medications may be titrated to effect.   - Determined the patient/guardian understands he/she has the right to accept or refuse medications (unless court-ordered or in emergency circumstances).   - Determined the patient has the capacity to give or withhold informed consent.    [X]   Patient has been determined to have capacity    [  ]  Patient has been determined to not have capacity    [  ]  Patient has a guardian      These medications were addressed with Alexander [name]    Scheduled Medications  ??? lamoTRIgine  25 mg Oral Daily 0900   ??? nicotine  1 patch Transdermal Daily 0900       As Needed Medications  bismuth subsalicylate, cloNIDine HCl, hydrOXYzine pamoate, ibuprofen, loperamide, nicotine (polacrilex), ziprasidone    Consent will be obtained should medication(s) not listed above be added.    [X]   The patient/guardian agrees to the listed medications.  [  ]  The patient/guardian agrees to all medications but refuses to sign.  [  ]  The patient/guardian refuses all medications.      Provider: Gordy Levan, MD

## 2014-07-02 NOTE — Unmapped (Signed)
I was the attending physician on call, available to resident for supervision. While I did not see this patient in person or discuss with the resident, I am willing to cosign this note as reasonable care.     Va Broadwell, DO

## 2014-07-02 NOTE — Unmapped (Signed)
Problem: Major Mood Disorder  Goal: Mania or depressed mood won???t interfere with daily function  Intervention: Obtain clinically significant collateral information  Obtain clinically significant collateral information  Pt has no current outpatient providers. Pt went to one meeting at [facility] to complete Open Enrollment.   Intervention: Communicate with family members (with pt???s permission)  Communicate with family members (with patient???s consent).  Pt gives permission to speak to his girlfriend, [name], but current phone number in chart is not in service. Pt reports girlfriend's daughters have their phones and these numbers are in his wallet which is bagged up due to bed bugs.   Intervention: Provide chemical dependency counseling  Provide chemical dependency counseling.  Pt reports relapsing on heroin 3 weeks ago and drinking heavily. CD consult placed.   Intervention: Other psychotherapeutic interventions (describe)  Other psychotherapeutic interventions (describe)  Completed initial assessment via interview with pt and chart review.       Problem: Potential for Harm to Self or Others  Goal: Patient will not present an active danger to others due to a  Patient will not present an active danger to others due to a psychiatric illness.  Pt denies SI/ HI and reports not remembering what happened to lead to police bringing him in.

## 2014-07-03 MED ORDER — lamoTRIgine (LAMICTAL) 25 MG tablet
25 | ORAL_TABLET | Freq: Every day | ORAL | 1.00 refills | 30.00000 days | Status: AC
Start: 2014-07-03 — End: 2016-12-11

## 2014-07-03 MED ORDER — QUEtiapine (SEROQUEL) 100 MG tablet
100 | ORAL_TABLET | Freq: Every evening | ORAL | Status: AC
Start: 2014-07-03 — End: 2015-07-03

## 2014-07-03 MED ORDER — QUEtiapine (SEROQUEL) tablet 100 mg
100 | Freq: Every evening | ORAL | Status: AC
Start: 2014-07-03 — End: 2014-07-03

## 2014-07-03 MED ORDER — traZODone (DESYREL) 100 MG tablet
100 | ORAL_TABLET | Freq: Every evening | ORAL | Status: AC | PRN
Start: 2014-07-03 — End: 2016-12-11

## 2014-07-03 MED FILL — NICOTINE 21 MG/24 HR DAILY TRANSDERMAL PATCH: 21 21 mg/24 hr | TRANSDERMAL | Qty: 1

## 2014-07-03 MED FILL — NICORELIEF 2 MG GUM: 2 2 mg | BUCCAL | Qty: 4

## 2014-07-03 MED FILL — RISPERIDONE 2 MG DISINTEGRATING TABLET: 2 2 MG | ORAL | Qty: 1

## 2014-07-03 MED FILL — LORAZEPAM 1 MG TABLET: 1 1 MG | ORAL | Qty: 2

## 2014-07-03 MED FILL — IBUPROFEN 400 MG TABLET: 400 400 MG | ORAL | Qty: 1

## 2014-07-03 MED FILL — LOPERAMIDE 2 MG CAPSULE: 2 2 mg | ORAL | Qty: 1

## 2014-07-03 MED FILL — LAMOTRIGINE 25 MG TABLET: 25 25 MG | ORAL | Qty: 1

## 2014-07-03 MED FILL — CLONIDINE HCL 0.1 MG TABLET: 0.1 0.1 MG | ORAL | Qty: 1

## 2014-07-03 NOTE — Unmapped (Signed)
Pt.'s ex boss picked him up. Left without incidence.

## 2014-07-03 NOTE — Unmapped (Signed)
Problem: Major Thought Disorder  Goal: Thought disorder will not interfere with daily functioning  Intervention: Assess for target symptoms & provide for safety  Assess for target symptoms and provide for safety   Order given and received to discharge home. Personal belongings given. Discussed and has verbal understanding of discharge instructions.girlfriend picking him up.

## 2014-07-03 NOTE — Unmapped (Signed)
Continue taking your medications as prescribed. Do not use drugs/ alcohol. Seek treatment through [facility]. Attend AA / NA meetings and get a sponsor.   If you are having thoughts of suicide or feelings of crisis call [phone]), call 911, or go to your nearest emergency room.       You need to follow up with your primary care doctor to f/u on your labs, your liver functions elevated.      Recent Results (from the past 400 hour(s))   CBC    Collection Time     07/02/14  2:50 AM       Result Value Ref Range    WBC 6.6  3.8 - 10.8 10E3/uL    RBC 4.79  4.20 - 5.80 10E6/uL    Hemoglobin 14.8  13.2 - 17.1 g/dL    Hematocrit 13.0  86.5 - 50.0 %    MCV 89.7  80.0 - 100.0 fL    MCH 30.8  27.0 - 33.0 pg    MCHC 34.3  32.0 - 36.0 g/dL    RDW 78.4  69.6 - 29.5 %    Platelets 205  140 - 400 10E3/uL    MPV 9.8  7.5 - 11.5 fL   HEPATIC FUNCTION PANEL    Collection Time     07/02/14  2:50 AM       Result Value Ref Range    Total Bilirubin 0.5  0.0 - 1.5 mg/dL    Bilirubin, Direct 2.84  0.00 - 0.40 mg/dL    AST 132 (*) 13 - 39 U/L    ALT 336 (*) 7 - 52 U/L    Alkaline Phosphatase 90  36 - 125 U/L    Total Protein 6.7  6.4 - 8.9 g/dL    Albumin 4.0  3.5 - 5.7 g/dL    Bilirubin, Indirect 0.38  0.00 - 1.10 mg/dL   RENAL FUNCTION PANEL W/EGFR    Collection Time     07/02/14  2:50 AM       Result Value Ref Range    Sodium 140  133 - 146 mmol/L    Potassium 4.2  3.5 - 5.3 mmol/L    Chloride 107  98 - 110 mmol/L    CO2 25  21 - 33 mmol/L    Anion Gap 8  3 - 16 mmol/L    BUN 14  7 - 25 mg/dL    Creatinine 4.40  1.02 - 1.30 mg/dL    Glucose 86  70 - 725 mg/dL    Calcium 9.0  8.6 - 36.6 mg/dL    Phosphorus 2.5  2.1 - 4.7 mg/dL    Albumin 4.0  3.5 - 5.7 g/dL    GFR MDRD Af Amer 440      GFR MDRD Non Af Amer 84      Osmolality, Calculated 290  278 - 305 mOsm/kg   FOLATE    Collection Time     07/02/14  2:50 AM       Result Value Ref Range    Folic Acid 11.30  5.90 - 24.80 ng/mL   TREPONEMA PALLIDUM AB WITH REFLEX    Collection  Time     07/02/14  2:50 AM       Result Value Ref Range    Treponema Pallidum Negative  Negative   TSH    Collection Time     07/02/14  2:50 AM       Result Value Ref  Range    TSH 0.17 (*) 0.34 - 5.60 uIU/mL   VITAMIN B12    Collection Time     07/02/14  2:50 AM       Result Value Ref Range    Vitamin B-12 424  180 - 914 pg/mL   URINE DRUG SCREEN WITHOUT CONFIRMATION, STAT    Collection Time     07/02/14  3:25 PM       Result Value Ref Range    Amphetamine, 500 ng/mL Cutoff Negative  Negative    Barbiturates UR, 300  ng/mL Cutoff Negative  Negative    Benzodiazepines UR, 300 ng/mL Cutoff Negative  Negative    Cocaine UR, 300 ng/mL Cutoff Presumptive Positive (*) Negative    MDMA, 500 ng/mL Cutoff Negative  Negative    Methadone, UR, 300 ng/mL Cutoff Negative  Negative    Opiates UR, 300 ng/mL Cutoff Negative  Negative    Oxycodone, 100 ng/mL Cutoff Negative  Negative    Tricyclic Antidepressants, 300 ng/mL Cutoff Negative  Negative    THC UR, 50 ng/mL Cutoff Negative  Negative   URINALYSIS W/ REFLEX TO MICROSCOPIC    Collection Time     07/02/14  3:25 PM       Result Value Ref Range    Color, UA Yellow  Yellow,Straw    Clarity, UA Clear  Clear    Specific Gravity, UA 1.013  1.005 - 1.035    pH, UA 6.0  5.0 - 8.0    Protein, UA Negative  Negative mg/dL    Glucose, UA 161 (*) Negative mg/dL    Ketones, UA Negative  Negative mg/dL    Bilirubin, UA Negative  Negative    Blood, UA Negative  Negative    Nitrite, UA Negative  Negative    Urobilinogen, UA <2.0  0.2 - 1.9 mg/dL    Leukocytes, UA Negative  Negative   ]

## 2014-07-03 NOTE — Unmapped (Signed)
[facility] 5 d PHYSICIAN EVALUATION on d/c    MORSE BRUEGGEMANN     Admit Date 07/01/2014  D/c date 07/03/2014     Discharge Diagnosis  Axis I: Bipolar depressed 296.53 Mood Disorder NOS (r/o MDD, BAD); Opiate Abuse; EtOH Abuse; PTSD by hx   Axis II: Deferred   Axis III: Hep C   Axis IV: Problem with primary support group, Problem with employment, Problems accessing health care services and Problem related to legal system/crime   Axis V: GAF 55    Discharge Medications     Medication List      TAKE these medications, which are NEW      Quantity/Refills    QUEtiapine 100 MG tablet   Commonly known as:  SEROQUEL   Take 1 tablet (100 mg total) by mouth at bedtime. Indications: BIPOLAR DISORDER, CALM MIND, MOOD    Quantity:  60 tablet   Stop taking on:  07/03/2015   For:  BIPOLAR DISORDER, CALM MIND, MOOD   Refills:  1       traZODone 100 MG tablet   Commonly known as:  DESYREL   Take 1 tablet (100 mg total) by mouth at bedtime as needed for Sleep. Indications: SLEEP    Quantity:  30 tablet   For:  SLEEP   Refills:  1         TAKE these medications, which you were ALREADY TAKING      Quantity/Refills    lamoTRIgine 25 MG tablet   Commonly known as:  LAMICTAL   Take 1 tablet (25 mg total) by mouth daily. Indications: BIPOLAR DISORDER, Depression, MOOD    Quantity:  30 tablet   For:  BIPOLAR DISORDER, Depression, MOOD   Refills:  1         STOP taking these medications         ibuprofen 800 MG tablet   Commonly known as:  ADVIL,MOTRIN       risperiDONE 2 MG tablet   Commonly known as:  RISPERDAL         Where to Get Your Medications    These are the prescriptions that you need to pick up.        You may get the following medications from any pharmacy   -  lamoTRIgine 25 MG tablet   -  QUEtiapine 100 MG tablet   -  traZODone 100 MG tablet                        Consults  MSW OT RT Chemical Dependence Specialist  [facility]   Chemical Dependency Consultation   Patient name: CHIA ROCK   DOB: [birth year]   MRN: [id number]      Alcohol/Drug History in past 12 months   Have you used any substance (prescription drugs, non prescription drugs, inhalants, bath salts, etc.) other than directed or abused alcohol in the past 12 months?: Yes   Illegal drug use/abuse past 12 months: Yes   Prescription drug abuse 12 months: Yes   Non-prescription abuse in last 12 months: Yes   Any alcohol abuse in last 12 months: Yes   IV DRUG USE: Yes   Used chemicals (other than as prescribed) in the past 12 months?: Yes   Chemicals Used:   Type of Other Chemical Used: Alcohol   Type of Other Chemical Used: Heroin   CD NOTE: I met with pt to complete drug and alcohol assessment. Pt reports  using Heroin and Alcohol daily. LDU 07/01/14. Currently on probation on [facility] for [place] and DV. Court date August 10. Discussed treatment options. Pt is court ordered to follow through with [facility]. Pt states that he had an appointment with [facility] but hasn't followed through. Writer provided pt with [employer] number [phone]. Pt needs to call them for follow up appointment  [facility]   Social Work Inpatient Psychiatry Summary   Patient name: JAMION CARTER Patient MRN: [id number]   DOB: 09/24/1986 Age: 28 y.o. Gender: male   Date of admission: 07/01/2014   Circumstances leading to hospitalization: Pt is a 28 yo Caucasian male with a history of mood disorder and heroin / alcohol abuse who presents to [facility] via police for SI. Pt states I can't remember what happened. I said something to my girlfriend. I'm an alcoholic.   Admission Information: Pt born and raised locally. One brother committed suicide in 05/23/07 and one brother committed suicide in 2008/05/22; one by heroin OD and the other by GSW. Father died in 05/22/2010. Mother and sister moved to [place] following father's death. Pt currently lives with girlfriend [name] and her three teenage girls. Pt has history of hospitalizations from suicide attempts and history of legal  charges. Pt dropped out of school in 12th grade. Pt reports being fired last week from job working at [employer], seasonally. History of drug use. Current probation violation for DUI charges, next court date August 10th. Mandatory treatment through probation and has been to one meeting at the [facility] to connect for services.   Pt asking for assistance in obtaining his glasses. Reports losing them in the back of police car. Provided [place] police department number. Pt called and found glasses were given to pt's girlfriend, [name]. [name] is unable to reached at this time due to number not being in service. Pt has bed bugs and belongings are sealed in soil room, unable to retrieve additional phone numbers in wallet.   Patient Information    Why are you here?: I'm an alcoholic and I said something to my girlfriend which I can't remember    Precipitant for Admission: Suicidal Ideation/Act   How do you wish to be addressed?: [name]   Current Mental Status: Awake   Guardian Type: None   Name of Guardian: n/a   Number of children and their names: none, but lives with girlfriend's 3 children aged 69, 22, and 29     Patient's Sexual Orientation: Heterosexual   Gender Identity: Male   Any aspects of your orientation that will help [place] provide better care for you: none   Patient Education Level: 9-11 Grade (dropped out in 12th grade)   Patient Income Source: Family (tries to work, was fired from [employer])   Support System: Significant other   Safety    Self Injurious Thoughts: Denies   Self Injurious Behaviors: None observed (history of suicide attempts; OD, hanging, heroin)   Thoughts of Harming Others: Denies   Harmful Actions Toward Others: None observed   Any safety concerns: pt has history of serious suicide attempts; in January 2015 OD on tylenol and percocet in which pt was in medical for few days; heroin OD 3 weeks ago needing narcan   Support System    Marital Status: Significant Other/Life Partner    Significant Other: [name]   Other Support, Name and relationship: none   May We Obtain Collateral Information  From Family, Friends and Neighbors?: Yes, with reservations (Comment) (girlfriend)   Mental Health Agency: [facility] - only gone to Open Enrollment once   Psychiatrist: not yet assigned   Legal Status    Patient Mental Health Legal Status: Involuntary   What is your current criminal legal status?: Probation;History of incarceration   Name & number of parole officer: not provided   Current Legal Charges: pt is currently on probation violation; DUI charges   Court date for charges: 07/14/14   Legal History: hx of incarceration on weapon charges   Alcohol/Drug History Past 12 Months    Are you currently using drugs/alcohol?: Yes         Referral to CD?: Yes   CD History: relapse on heroin 3 weeks ago, history of cocaine, heroin, and MJ, reports being an alcoholic   Abuse/Trauma History    Abuse History: Denies   Is anyone in the home being abused or neglected?: No     Trauma/Loss History: denies any history of abuse; pt's father died in 05/14/2010, both brothers have committed suicide: one by GSW and the other by heroin OD in May 15, 2007 and 05/14/08   Spiritual      Spiritual/Cultural Requests: No   Cultural      Primary Language: English   Religious/Cultural Factors: Caucasian Male   Collateral Information    Collateral Information: pt gives permission to speak to gf [name]. Unable to reach due to number in chart being not in service.   Formulation of Plan    Formulation of Plan: SW continue to follow. Maintain safety and stabilization. Obtain collateral from family/ friends. Discharge planning.   [place]   Psychiatric Social Work   Discharge Summary Note   Treatment team met with pt. Pt requesting medications for mood and discussed past trials with doctor. Refusing Risperdal. Agreed upon Seroquel. Pt denies SI/ HI and feeling comfortable going back home. Reports speaking with old boss and finding support through  him, planning to return to work next week and even getting a ride home today by this boss. Pt gives permission to speak to family if they can be contacted. SW found old numbers for sister / mom in chart. Unable to reach anyone via these numbers.   Family/Significant Other Communication:   Family/Support Person: (attempte to call both numbers in chart, gf not in service, family not in service)   Discharge Information:   Recommended Treatment: Individual   DC Plan discussed w/ pt?: Yes   Discharge Disposition: Friend's home (living with girlfriend)   Discharge Legal Status: N/A   Transition of Care:   Discharge Summary Faxed? )Yes   Date: 07/03/14 Time: 1500 Fax #: [phone]   Agency/Provider Name: [facility] Open Enrollment   Contact#: [phone]   Barriers to Transition of Care: None   Follow-up Appointments:   [facility] [address] [phone]   Call on 07/03/2014   Walk in between 8:30-3:30pm. Connect for mandatory treatment.   [facility] Team   On 07/09/2014     Discharge Instructions:   Continue taking your medications as prescribed. Do not use alcohol / drugs. Seek treatment through [facility]. Attend AA/ NA meetings and get a sponsor. If you are having thoughts of suicide or feelings of crisis call [phone] May 14, [phone]), call 911, or go to your nearest emergency room.   Kemper Durie MSW LISW   07/03/2014  REASON FOR ADMIT  CC: apparently I was going to stab my girlfriend with a knife / mood problems, off meds, using street drugs   Context: stress in a patient with hx PSA with heroin and alcohol and mood disorder   Location: Altered mental status of mood   Duration: 2 days   Severity: severe came to PES, was labile, voiced ideas he would stab gf, in PES very labile   Associated Symptoms: SI, HI / not sleeping, irritable.   Modifying Factors: medication noncompliance hep c, liver fuction issues   Timing worse at night   Quality moody irritable   HPI: POWELL HALBERT is a 27 y.o. male with a history of mood disorder and heroin and alcohol abuse who presents to [place] via police for SI. Pt was drinking earlier in the day prior to PES and made suicidal comments to his girlfriend. Pt reports he also apparently told his girlfriend he was going to stab her, but he does not recall this. Pt continues to endorse SI. He says he intends to commit suicide by cop because he is too much of a pussy to kill myself and would therefore get someone else to do it. Pt also endorses HI with intent to harm the boss who recently fired him. He also mentions he might act on the accusation referring to stabbing his girlfriend. He was very moody, he say he was banging his head around in the police car and in PES needed IM meds.   Pt reports multiple stressors. He recently lost his job and without any income he is struggling to pay his bills. He also reported difficulty in his relationship. He says everything is going down the fucking drain. Pt also reports that he recently relapsed on heroin after being clean for 2 months. Since relapsing he reports using about 3x/week for the past 3 weeks. He says he had mood problems prior to the relapse   Pt endorses symptoms of depression. He endorses poor sleep, anhedonia, feelings of worthlessness, low energy, difficulty concentrating and a poor appetite.   Pt had most recently been prescribed Trazodone and Clonidine, but reports he felt it cause swelling in his throat so he stopped taking them. He reports the most helpful medication in the past was Lamictal.   He reports his mood was bad and he was having si. He is hopeful that we can get him back on meds and help him regain control.   Past Psychiatric History:   Prior hospitalizations: [facility]3 (10/2011, 11/2013, 12/2013), [facility] earlier this month   Prior P[facility] Visits: 1 since last admission   Prior Diagnoses: depression, bipolar   Prior Medication Trials: lithium, klonopin, trazodone,  lamictal, risperdal (gynecomastia), celexa, seroquel   Outpatient Treatment: [facility]- reports going to walk-in last week and completing 1/2 of intake assessment   Suicide Attempts: 2009-hanging, 2013- Tylenol OD   Substance Use History:   Nicotine: 2 PPD   Alcohol: drinks to intoxication daily   Illicits: heroin 3x/week   Past Medical History: Hep c   Past Medical History    Diagnosis  Date    ???  Depression     ???  Drug abuse and dependence     ???  H/O tooth extraction     ???  Thyroid disease     ???  Substance induced mood disorder     ???  PTSD (post-traumatic stress disorder)     ???  Anxiety     ???  Bipolar disorder     ???  Bronchitis     ???  Liver disease  Hep C    ???  Hepatitis C       Past Surgical History    Procedure  Laterality  Date    ???  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  Beverely Low, MD    lamoTRIgine (LAMICTAL) 25 MG tablet  Take 1 tablet (25 mg total) by mouth daily.  [name], DO    risperiDONE (RISPERDAL) 2 MG tablet  Take 1 tablet (2 mg total) by mouth At bedtime.  [name], DO    ROS: denies CP/SOB, denies nausea/vomiting, diarrhea/constipation   OBJECTIVE:   Vitals:   Filed Vitals:     07/02/14 0130  07/02/14 0300  07/02/14 0949  07/02/14  1445    BP:  147/75  142/80  136/84  148/100    Pulse:  95  88  84     Temp:   98.4 ??F (36.9 ??C)  98.3 ??F (36.8 ??C)     TempSrc:   Oral  Oral     Resp:  18  16  18      SpO2:  98%  98%  99%       Physical Exam   Constitutional: well-developed, well-nourished, and in no distress.   HEENT: Normocephalic, small raised area on forehead at hairline, PERRLA, EOMI   Cardiovascular: Normal rate, regular rhythm and normal heart sounds.   Pulmonary: Effort normal and breath sounds normal. no wheezes, rales or rhonchi   Abdominal: Soft, non-distended, non-tender, bowel sounds normal  Musculoskeletal: Normal range of motion.   Skin: Skin is warm and dry.   Mental Status Exam on admit:   Gen: dressed in casual clothing, long hair, sort of scruffy, disheveled, looks to have much anxiety, fidgety   Orientation: alert, oriented to person, place, purpose   Speech: little pressured, hesitant   Mood/Affect: not so good depressed, overwhelmed by stress/constricted   Thought Process: logical, goal-oriented, no LOA, no FOI   Thought Content: endorses SI & HI Reports he has been overhwlemed and feeling out of control   Perceptions: denies AH/VH, not observed RTIS, does not appear preoccupied with the internal environment   Aox3, IQ ave, attention and concentration mild impaired   Insight/Judgement: limited/limited   Labs: His LFT are high   Recent Results (from the past 72 hour(s))    CBC     Collection Time     07/02/14 2:50 AM    Result  Value  Ref Range     WBC  6.6  3.8 - 10.8 10E3/uL  RBC  4.79  4.20 - 5.80 10E6/uL     Hemoglobin  14.8  13.2 - 17.1 g/dL     Hematocrit  09.8  11.9 - 50.0 %     MCV  89.7  80.0 - 100.0 fL     MCH  30.8  27.0 - 33.0 pg     MCHC  34.3  32.0 - 36.0 g/dL     RDW  14.7  82.9 - 56.2 %     Platelets  205  140 - 400 10E3/uL     MPV  9.8  7.5 - 11.5 fL    HEPATIC FUNCTION PANEL     Collection Time     07/02/14 2:50 AM    Result  Value  Ref Range     Total Bilirubin  0.5  0.0 - 1.5 mg/dL     Bilirubin, Direct   0.12  0.00 - 0.40 mg/dL     AST  130 (*)  13 - 39 U/L     ALT  336 (*)  7 - 52 U/L     Alkaline Phosphatase  90  36 - 125 U/L     Total Protein  6.7  6.4 - 8.9 g/dL     Albumin  4.0  3.5 - 5.7 g/dL     Bilirubin, Indirect  0.38  0.00 - 1.10 mg/dL    RENAL FUNCTION PANEL W/EGFR     Collection Time     07/02/14 2:50 AM    Result  Value  Ref Range     Sodium  140  133 - 146 mmol/L     Potassium  4.2  3.5 - 5.3 mmol/L     Chloride  107  98 - 110 mmol/L     CO2  25  21 - 33 mmol/L     Anion Gap  8  3 - 16 mmol/L     BUN  14  7 - 25 mg/dL     Creatinine  8.65  7.84 - 1.30 mg/dL     Glucose  86  70 - 696 mg/dL     Calcium  9.0  8.6 - 10.3 mg/dL     Phosphorus  2.5  2.1 - 4.7 mg/dL     Albumin  4.0  3.5 - 5.7 g/dL     GFR MDRD Af Amer  295      GFR MDRD Non Af Amer  84      Osmolality, Calculated  290  278 - 305 mOsm/kg    FOLATE     Collection Time     07/02/14 2:50 AM    Result  Value  Ref Range     Folic Acid  11.30  5.90 - 24.80 ng/mL    TREPONEMA PALLIDUM AB WITH REFLEX     Collection Time     07/02/14 2:50 AM    Result  Value  Ref Range     Treponema Pallidum  Negative  Negative    TSH     Collection Time     07/02/14 2:50 AM    Result  Value  Ref Range     TSH  0.17 (*)  0.34 - 5.60 uIU/mL    VITAMIN B12     Collection Time     07/02/14 2:50 AM    Result  Value  Ref Range     Vitamin B-12  424  180 - 914 pg/mL    URINE DRUG SCREEN WITHOUT CONFIRMATION,  STAT     Collection Time     07/02/14 3:25 PM    Result  Value  Ref Range     Amphetamine, 500 ng/mL Cutoff  Negative  Negative     Barbiturates UR, 300 ng/mL Cutoff  Negative  Negative     Benzodiazepines UR, 300 ng/mL Cutoff  Negative  Negative     Cocaine UR, 300 ng/mL Cutoff  Presumptive Positive (*)  Negative     MDMA, 500 ng/mL Cutoff  Negative  Negative     Methadone, UR, 300 ng/mL Cutoff  Negative  Negative     Opiates UR, 300 ng/mL Cutoff  Negative  Negative     Oxycodone, 100 ng/mL Cutoff  Negative  Negative     Tricyclic Antidepressants, 300 ng/mL Cutoff   Negative  Negative     THC UR, 50 ng/mL Cutoff  Negative  Negative    URINALYSIS W/ REFLEX TO MICROSCOPIC     Collection Time     07/02/14 3:25 PM    Result  Value  Ref Range     Color, UA  Yellow  Yellow,Straw     Clarity, UA  Clear  Clear     Specific Gravity, UA  1.013  1.005 - 1.035     pH, UA  6.0  5.0 - 8.0     Protein, UA  Negative  Negative mg/dL     Glucose, UA  540 (*)  Negative mg/dL     Ketones, UA  Negative  Negative mg/dL     Bilirubin, UA  Negative  Negative     Blood, UA  Negative  Negative     Nitrite, UA  Negative  Negative     Urobilinogen, UA  <2.0  0.2 - 1.9 mg/dL     Leukocytes, UA  Negative  Negative    ]   No results found for this or any previous visit (from the past 24 hour(s)).    Imaging: no new head imaging on file    Hospital course ASSESSMENT AND RECOMMENDATIONS:   JAYCEE PELZER is a 28 y.o. male who presented to [facility] for SI. Pt continues to endorse SI with plan and intent. He also reports HI with intent to harm his previous employer. Pt endorses symptoms of depression. Pt has a history of serious suicide attempts. We started pt on Lamictal for mood stabilization as he reports it being effective in the past. Additionally, pt is not a good candidate for other similar medications due to his impaired hepatic function.  We think if he stays off alcohol these will go back to normal    On admit the dignosis was, but after getting more hx find he does have hx bipolar  Axis I: Mood Disorder NOS (r/o MDD, BAD); Opiate Abuse; EtOH Abuse; PTSD by hx   Axis II: Deferred   Axis III: Hep C   Axis IV: Problem with primary support group, Problem with employment, Problems accessing health care services and Problem related to legal system/crime   Axis V: GAF 11-20 some danger of hurting self or others possible   Safety: Pt requires inpatient hospitalization as the least restrictive means of ensuring safety   1. Mood Disorder / bipolar  - Start Lamictal 25mg  daily / add risperidal, titrate meds, asked  for chem depnd consult, he is not doing well, he needs inpt care, heavy family hx of sucide puts him in high risks group. I suspect he will improve with meds and  tx and mobiling his social supports and he wants help and he palns get self sober again    Admit to Inpatient  As he was in crisis and suicidal and needed that level of care    We started risperidal but he said he not want the risks of gynecomastia so he is not willing to take, we talked about risks bene and side effects to several medications including risks weight gain and DM.  He is willing to try seroquel and says it past it helped his sleep.  He wanted to try depakote but we said not now with high LFT.    He plans to get f/u and see his doc.    He is ok to go home.  Needs to see family doc, is doing better.    He improved quickly on ward with meds, supportive therapy, sobriety and he mobilized his social supports, we feel he is safe to go home now, he plans to go to [facility] today and finish his intake and go to AA.       Mental Status Exam on d/c  Filed Vitals:    07/03/14 0330 07/03/14 0900 07/03/14 1100 07/03/14 1102   BP: 136/94 151/86 136/108 136/108   Pulse: 92 99 97    Temp: 98.2 ??F (36.8 ??C) 98.3 ??F (36.8 ??C)     TempSrc:  Oral     Resp: 18 16     SpO2: 99% 99%       Gen: dressed in casual clothing, long hair, sort of scruffy, he has cleaned self up, he is calm and friendly  Orientation: alert, oriented to person, place, purpose   Speech: clear and normal tone prosody  Mood/Affect: lot better, feels hopeful, he feels he will be ok and this time motivated to f/u with [facility] and do his tx, plans go AA and find a sponsor   Thought Process: logical, goal-oriented, no LOA, no FOI   Thought Content: says he is feeling in control now, no si/hi, no desire to use, says he feels he can stay sober now, says he has to as on probation.    Perceptions: denies AH/VH, not observed RTIS, does not appear preoccupied with the internal environment   Aox3, IQ  ave, attention and concentration ok now  Insight/Judgement: better / ok      Plan  Will d/c home, safe to d/c, tx in community setting, outpt f/u, AA daily, get a sponsor and home group.  I meet with pt 35 min on d/c day with team.  F/u with family doc

## 2014-07-03 NOTE — Unmapped (Signed)
Problem: Major Thought Disorder  Goal: Thought disorder will not interfere with daily functioning  Intervention: Assess for target symptoms & provide for safety  Assess for target symptoms and provide for safety   Pt. Has ben visible on unit. Told nurse was not feeling great. Around 11:15 came up nurses station and c/o having some withdrawl symptoms which wee diarrhea,headache,anxiety.blood pressure was 136/108 heart rate was 97. Appetite good ate 2 breakfast trays  Intervention: Dispense prescribed medication per MD order  Compliant with morning medication.received motrin 400 mg for /co ha pain,immodium 2 mg for diarrhea,cloindine for withdrawal symptons and ativan 2mg  for aniety and being restless.results pending  Intervention: Address personal hygiene and care  Showered this am.

## 2014-07-03 NOTE — Unmapped (Signed)
Problem: Other medical condition  Goal: Patient will remain medically stable for follow up in OP set  Intervention: Assess for target symptoms & provide for safety  Assess for target symptoms and provide for safety   Genevieve is feeling very restless and is not able to go to sleep.  He is sneezing and his nose is running.    HR 98 B/p elevated.  He is being monitored per protocol and safety is being maintained.  Intervention: Dispense prescribed medication per MD order  Ativan 2mg  given at 0047 with very little effect in helping him with sleep  Intervention: Notify MD of abnrml values,unresolv pain,refusal of trea  He is less restless and he is being monitored per protocol.

## 2014-07-03 NOTE — Unmapped (Signed)
Occupational Therapy/Therapeutic Recreation Progress Note      Alexander [name]  [id number]      Group: Task  Attendance: Attended  10/60 minutes  Level of Participation: Moderate, Interacted with peer(s), Interacted with staff and Initiated Comment(s)    Pt attended group upon invite. Pt was pleasant and cooperative. PT was able to state that he is a heavy chain smoker and that the cost of his cigarettes is expensive and since he is not working it is difficult.  Pt recognizes that he has increased his drug use as well. Pt reported that he uses these unhealthy coping mechanisms when he is stressed.  Pt left the group d/t a headache and did not return.  Pt is hopeful he can attend the next group if his headache goes away.     Thea Gist, CTRS  07/03/2014 12:12 PM

## 2014-07-03 NOTE — Unmapped (Signed)
Problem: Major Mood Disorder  Goal: Mania or depressed mood won???t interfere with daily function  Intervention: Discharge planning  Pt denies SI/ HI and feels comfortable going home. Pt has court mandated treatment through [facility] for probation violation of DUI. He plans to go into Open Enrollment to complete the process today.       Problem: Potential for Harm to Self or Others  Goal: Patient will not present an active danger to others due to a  Patient will not present an active danger to others due to a psychiatric illness.   Pt denies SI/ HI and feels comfortable going home.

## 2014-07-03 NOTE — Unmapped (Signed)
[place]  Psychiatric Social Work   Discharge Summary Note    Treatment team met with Alexander [name]. Alexander [name] requesting medications for mood and discussed past trials with doctor. Refusing Risperdal. Agreed upon Seroquel. Alexander [name] denies SI/ HI and feeling comfortable going back home. Reports speaking with old boss and finding support through him, planning to return to work next week and even getting a ride home today by this boss. Alexander [name] gives permission to speak to family if they can be contacted. SW found old numbers for sister / mom in chart. Unable to reach anyone via these numbers.    Family/Significant Other Communication:  Family/Support Person:  (attempte to call both numbers in chart, gf not in service, family not in service)    Discharge Information:  Recommended Treatment: Individual  DC Plan discussed w/ Alexander [name]?: Yes  Discharge Disposition: Friend's home (living with girlfriend)  Discharge Legal Status: N/A    Transition of Care:  Discharge Summary Faxed? )Yes  Date: 07/03/14 Time: 1500 Fax #: [phone]  Agency/Provider Name: [facility] Open Enrollment  Contact#:  [phone]    Barriers to Transition of Care: None    Follow-up Appointments:  [facility] [address] [phone]  Call on 07/03/2014  Walk in between 8:30-3:30pm. Connect for mandatory treatment.    [facility]ious [facility] Team    On 07/09/2014        Discharge Instructions:  Continue taking your medications as prescribed. Do not use alcohol / drugs. Seek treatment through [facility]. Attend AA/ NA meetings and get a sponsor. If you are having thoughts of suicide or feelings of crisis call [phone]), call 911, or go to your nearest emergency room.       Kemper Durie MSW LISW  07/03/2014

## 2014-07-09 ENCOUNTER — Ambulatory Visit: Payer: PRIVATE HEALTH INSURANCE

## 2014-07-09 NOTE — Discharge Summary (Signed)
PATIENT NAME:                 PA #:            MR #Brett [name]:                 [name], Brett                [id number]       [id number]            ATTENDING PHYSICIAN:                  ADM DATE:   DIS DATE:          Elberta FortisLARRY A GRAHAM, MD                    06/09/2014  06/12/2014         DATE OF BIRTH:   AGE:           PATIENT TYPE:                         January 06, 1986       27             PYC                                      DISPOSITION: The patient will be discharged home to follow up with outpatient  services through [facility] and referral to [facility].        DISCHARGE MEDICATIONS:  1.  Cogentin 1 mg b.i.d. as needed for EPS.  2.  Clonidine 0.1 mg b.i.d.   3.  Trazadone 50 mg at night for sleep as needed.      DISCHARGE DIAGNOSES:  AXIS I:    1.  Mood disorder, unspecified.   2.  Alcohol abuse.   3.  Heroin abuse.    AXIS II:  Personality disorder, unspecified.   AXIS III:  Hepatitis C, history of thyroid disease, elevated liver function  tests.   AXIS IV:  Moderate.   AXIS V AT DISCHARGE:  45.      REASON FOR HOSPITALIZATION:  This 28 year old white male presented [facility] with suicidal ideation and substance abuse issues.  He was found  along the road unconscious and was given naloxone and then aroused.  He has  been using heroin daily over the past 7 years and primarily snorting at this  point.  He also drinks a 12-pack a day of alcohol and states that he is  homeless and depressed.  He is currently not in treatment.  He has also lost  his job due to being intoxicated at work.  He has been at [facility] in  the past.       HOSPITAL COURSE:  Within 2 days the patient was feeling better as he was  initially started on Geodon.  Blood pressure was stabilized.  Ativan usage  declined within the first few days.  At the time of discharge he did have an  episode of what appeared to be a dystonic reaction with jaw tightening.  It  resolved quickly with Cogentin 2 mg.  At that point Geodon  was discontinued  as well as Haldol, and he was given Cogentin at discharge as needed.  He is  to follow  up with outpatient treatment.       MENTAL STATUS AT DISCHARGE:  The patient is alert and oriented to place and  time.  Speech is normal rate and tone.  No threats to harm self or others.   No psychotic symptoms are noted.  Insight and judgment improved.  He says his  mood is okay.  Affect is brighter.                                            Arelia Longest, MD     [id number]  DD: 07/09/2014 08:37   DT: 07/09/2014 09:04   Job #: [id number]  CC: Elberta Fortis, MD  CC: Arelia Longest, MD

## 2014-07-15 ENCOUNTER — Inpatient Hospital Stay
Admit: 2014-07-15 | Discharge: 2014-07-15 | Disposition: A | Discharge status: Home / Self Care | Payer: PRIVATE HEALTH INSURANCE

## 2014-07-15 DIAGNOSIS — T401X4A Poisoning by heroin, undetermined, initial encounter: Secondary | ICD-10-CM

## 2014-07-15 LAB — P24 W/REFLEX TESTED STUDY UCMC ED: HIV 1+2 AB/AGN: NONREACTIVE

## 2014-07-15 MED ORDER — ondansetron (ZOFRAN) tablet 4 mg
4 | Freq: Once | ORAL | Status: AC
Start: 2014-07-15 — End: 2014-07-15
  Administered 2014-07-15: 18:00:00 4 mg via ORAL

## 2014-07-15 MED ORDER — ibuprofen (ADVIL,MOTRIN) tablet 800 mg
400 | Freq: Once | ORAL | Status: AC
Start: 2014-07-15 — End: 2014-07-15
  Administered 2014-07-15: 16:00:00 800 mg via ORAL

## 2014-07-15 MED ORDER — sodium chloride 0.9 % 1,000 mL IV fluid
Freq: Once | INTRAVENOUS | Status: AC
Start: 2014-07-15 — End: 2014-07-15
  Administered 2014-07-15: 16:00:00 via INTRAVENOUS

## 2014-07-15 MED FILL — ONDANSETRON HCL 4 MG TABLET: 4 4 MG | ORAL | Qty: 1

## 2014-07-15 MED FILL — IBUPROFEN 400 MG TABLET: 400 400 MG | ORAL | Qty: 2

## 2014-07-15 NOTE — Unmapped (Signed)
ED Attending Attestation Note    Date of service:  07/15/2014    This patient was seen by the resident physician.  I have seen and examined the patient, agree with the workup, evaluation, management and diagnosis. The care plan has been discussed and I concur.     My assessment reveals a 28 y.o. male accident overdose with heroin.  He is awake alert status post Narcan we'll observe and reassess

## 2014-07-15 NOTE — Unmapped (Signed)
07/09/2014 1st No Show F/U call.  Was unable to reach patient and no VM.  Phone not accepting calls at this time.  No Show letter and Appointment Policies mailed.    No Case Manager listed and does not show up on [employer].

## 2014-07-15 NOTE — Unmapped (Signed)
ED Screening Protocol- YES

## 2014-07-15 NOTE — Unmapped (Signed)
The pt requested assistance with discharge transportation.  SW educated the pt on their insurance provider's transportation window (30 minutes-3 hours).  Pt is agreeable to this.  SW arranged discharge transportation for the pt through the pt's insurance provider, Luther Redo, to the address provided by the pt.  The transportation confirmation number/trip number is [id number].  SW provided the pt with the insurance provider's transportation department's phone number and the transportation confirmation number.  Pt will contact the insurance provider with further transportation needs and questions.  No further SW needs.    Lucrezia Europe, MSW  [facility] Medical Social Worker  [phone]

## 2014-07-15 NOTE — Unmapped (Signed)
Pt overdosed on heroin, given 2mg  Narcan.  Pt now A and O times 4.

## 2014-07-15 NOTE — Unmapped (Signed)
Bed: D20U  Expected date:   Expected time:   Means of arrival:   Comments:  Do not use

## 2014-07-15 NOTE — Unmapped (Signed)
Patient presents s/p accidental overdose on heroin. Patient required IV narcan for reversal and aroused. Now alert and oriented with c/o headache and overall not feeling well. Reports receiving narcan in the past on one occurrence. Patient admits to IV injection today after a long period of sobriety. Assessment otherwise WNL.

## 2014-07-15 NOTE — Unmapped (Signed)
Here is a list of primary care physicians in the community that you can establish care with:    [facility] Accepting Self Pay Patients on Sliding Scale Payment   Zip Code Name Address Phone Sliding Scale Area   [phone] [facility] [address] [phone] $15 [place] residents   [phone] [facility] [address] [phone] $20 No Restrictions   [phone] [facility] [address] [phone] $10 No Restrictions   [address] [phone] $40 No Restrictions   [phone] [facility] [address] [phone] $20 No Restrictions   [phone] [facility] [address] [phone] $40 No Restrictions   [phone] [facility] [address] [phone] $25 No Restrictions   [phone] [facility] [address] [phone] $20 [place] Residents   [phone] [facility] [address] [phone] $20 [place] Residents   [phone] [facility] [address] [phone] $20 [place] Residents   [phone] [facility] [address] [phone] $20 [place] Residents   [phone] [facility] [address] [phone] $20 [place] Residents

## 2014-07-15 NOTE — Unmapped (Signed)
[facility] ED Note    Date of service:  07/15/2014    Reason for Visit: Overdose-Accidental      Patient History     HPI Comments: Alexander [name] is a 28yo man with past medical history as detailed below pertinent for substance abuse and hepatitis C, brought in by EMS after accidental heroin overdose earlier today. He received 2mg  IV Narcan prior to arrival at approximately 1130am. Patient reports that he had been clean from heroin for about 2 months. Earlier today had been drinking prior to work, so his boss told him he could leave work early. After leaving work, he met up with some friends and ended up using heroin. He denies any medication ingestions or other illicit drug use. He denies any SI, HI, or auditory/visual hallucinations. He endorses a headache and nausea, and also feels hungry. He denies any difficulty breathing/shortness of breath. He denies sustaining any significant injuries.    The history is provided by the patient and the EMS personnel.          Past Medical History   Diagnosis Date   ??? Depression    ??? Drug abuse and dependence    ??? H/O tooth extraction    ??? Thyroid disease    ??? Substance induced mood disorder    ??? PTSD (post-traumatic stress disorder)    ??? Anxiety    ??? Bipolar disorder    ??? Bronchitis    ??? Liver disease Hep C   ??? Hepatitis C        Past Surgical History   Procedure Laterality Date   ??? Mandible surgery         Patient  reports that he has been smoking Cigarettes.  He has a 22 pack-year smoking history. He does not have any smokeless tobacco history on file. He reports that he drinks alcohol. He reports that he uses illicit drugs (Heroin, Other-see comments, and Marijuana).      Previous Medications    LAMOTRIGINE (LAMICTAL) 25 MG TABLET    Take 1 tablet (25 mg total) by mouth daily. Indications: BIPOLAR DISORDER, Depression, MOOD    QUETIAPINE (SEROQUEL) 100 MG TABLET    Take 1 tablet (100 mg total) by mouth at bedtime. Indications:  BIPOLAR DISORDER, CALM MIND, MOOD    TRAZODONE (DESYREL) 100 MG TABLET    Take 1 tablet (100 mg total) by mouth at bedtime as needed for Sleep. Indications: SLEEP       Allergies:   Allergies as of 07/15/2014   ??? (No Known Allergies)       Review of Systems     Review of Systems   Constitutional: Negative for fever.   HENT: Negative for sore throat.    Eyes: Negative for visual disturbance.   Respiratory: Negative for shortness of breath.    Cardiovascular: Negative for chest pain.   Gastrointestinal: Positive for nausea. Negative for vomiting and abdominal pain.   Skin: Negative for rash.   Neurological: Positive for headaches.   Psychiatric/Behavioral: Negative for suicidal ideas, hallucinations and self-injury.           Physical Exam     ED Triage Vitals   Vital Signs Group      Temp 07/15/14 1153 98.2 ??F (36.8 ??C)      Temp Source 07/15/14 1153 Oral      Heart Rate 07/15/14 1153 108      Heart Rate Source --       Resp 07/15/14 1153 18  SpO2 07/15/14 1153 95 %      BP 07/15/14 1153 133/80 mmHg      BP Location 07/15/14 1153 Right arm      BP Method 07/15/14 1153 Automatic      Patient Position --    SpO2 07/15/14 1153 95 %   O2 Device 07/15/14 1153 None (Room air)       Physical Exam   Vitals reviewed.  Constitutional: He appears well-developed and well-nourished. No distress.   HENT:   Head: Normocephalic and atraumatic.   Mouth/Throat: Oropharynx is clear and moist.   Eyes: EOM are normal.   PERRL 2-->1 bilaterally   Neck: Normal range of motion.   Cardiovascular: Normal rate, regular rhythm, normal heart sounds and intact distal pulses.    No murmur heard.  Pulmonary/Chest: Effort normal and breath sounds normal. No respiratory distress. He has no rales. He exhibits no tenderness.   Abdominal: Soft. He exhibits no distension. There is no tenderness.   Musculoskeletal: Normal range of motion.   Neurological: Normal speech without aphasia or dysarthria.  Moves all extremities spontaneosly and  symmetrically.    Skin: Skin is warm.   Psychiatric:   Cooperative with history and exam, alert and answering questions appropriately         Diagnostic Studies     Labs:    No tests were performed during this ED visit    Radiology:    No tests were performed during this ED visit    EKG:    No EKG Performed    Emergency Department Procedures     Procedures    ED Course and MDM     Alexander [name] is a 28 y.o. male who presented to the emergency department following accidental overdose of heroin s/p 2mg  Narcan prior to arrival. On initial evaluation, patient endorses a mild headache, but has an otherwise non-focal exam, no hypoxia, and no respiratory symptoms.    After initial evaluation, patient provided with NS bolus and Motrin 800mg  for headache, Zofran 4mg  IV for some nausea, as well as a sandwich. Deferred chest radiograph or other imaging given no hypoxia or respiratory symptoms. Deferred any additional laboratory evaluation given appropriate mental status and denying additional ingestions other than ETOH and heroin.    Patient observed in the ER for 2 hours following Narcan administration. After this observation period, patient remains awake and alert, no respiratory compromise, headache resolved, vitals reassuring, and clinically sober. Patient observed to ambulate and tolerate PO. At this time patient deemed safe for discharge home. I provided my usual strict return precautions. Patient verbalized understanding and is comfortable with discharge home at this time.     Above plan discussed with senior resident and attending physicians, who are in agreement.    Claudia Pollock Malai Lady  Pediatric Fellow       Critical Care Time (Attendings)           Burt Knack, MD  Resident  07/15/14 [phone]

## 2014-08-03 ENCOUNTER — Inpatient Hospital Stay: Admit: 2014-08-03 | Discharge: 2014-08-03 | Attending: Emergency Medicine

## 2014-08-03 LAB — CBC WITH AUTO DIFFERENTIAL
Basophils %: 0.2 %
Basophils Absolute: 0 10*3/uL (ref 0.0–0.2)
Eosinophils %: 0.2 %
Eosinophils Absolute: 0 10*3/uL (ref 0.0–0.6)
Hematocrit: 47.2 % (ref 40.5–52.5)
Hemoglobin: 16 g/dL (ref 13.5–17.5)
Lymphocytes %: 10 %
Lymphocytes Absolute: 0.8 10*3/uL — ABNORMAL LOW (ref 1.0–5.1)
MCH: 31.3 pg (ref 26.0–34.0)
MCHC: 34 g/dL (ref 31.0–36.0)
MCV: 92 fL (ref 80.0–100.0)
MPV: 9.7 fL (ref 5.0–10.5)
Monocytes %: 6.8 %
Monocytes Absolute: 0.5 10*3/uL (ref 0.0–1.3)
Neutrophils %: 82.8 %
Neutrophils Absolute: 6.4 10*3/uL (ref 1.7–7.7)
Platelets: 256 10*3/uL (ref 135–450)
RBC: 5.13 M/uL (ref 4.20–5.90)
RDW: 13.2 % (ref 12.4–15.4)
WBC: 7.7 10*3/uL (ref 4.0–11.0)

## 2014-08-03 LAB — URINALYSIS
Blood, Urine: NEGATIVE
Glucose, Ur: NEGATIVE mg/dL
Ketones, Urine: >=80 mg/dL — CR
Leukocyte Esterase, Urine: NEGATIVE
Nitrite, Urine: NEGATIVE
Protein, UA: NEGATIVE mg/dL
Specific Gravity, UA: 1.015 (ref 1.005–1.030)
Urobilinogen, Urine: 1 E.U./dL (ref <2.0)
pH, UA: 6 (ref 5.0–8.0)

## 2014-08-03 LAB — COMPREHENSIVE METABOLIC PANEL
ALT: 475 U/L — ABNORMAL HIGH (ref 10–40)
AST: 472 U/L — ABNORMAL HIGH (ref 15–37)
Albumin/Globulin Ratio: 1.4 (ref 1.1–2.2)
Albumin: 4.5 g/dL (ref 3.4–5.0)
Alkaline Phosphatase: 131 U/L — ABNORMAL HIGH (ref 40–129)
Anion Gap: 17 — ABNORMAL HIGH (ref 3–16)
BUN: 8 mg/dL (ref 7–20)
CO2: 24 mmol/L (ref 21–32)
Calcium: 9.6 mg/dL (ref 8.3–10.6)
Chloride: 101 mmol/L (ref 99–110)
Creatinine: 0.8 mg/dL — ABNORMAL LOW (ref 0.9–1.3)
GFR African American: >60 (ref >60)
GFR Non-African American: >60 (ref >60)
Globulin: 3.2 g/dL
Glucose: 98 mg/dL (ref 70–99)
Potassium: 3.9 mmol/L (ref 3.5–5.1)
Sodium: 142 mmol/L (ref 136–145)
Total Bilirubin: 0.9 mg/dL (ref 0.0–1.0)
Total Protein: 7.7 g/dL (ref 6.4–8.2)

## 2014-08-03 LAB — URINE DRUG SCREEN
Amphetamine Screen, Urine: NEGATIVE
Barbiturate Screen, Ur: NEGATIVE (ref <200)
Benzodiazepine Screen, Urine: POSITIVE — AB (ref <200)
Cannabinoid Scrn, Ur: POSITIVE — AB (ref <50)
Cocaine Metabolite Screen, Urine: POSITIVE — AB (ref <300)
Methadone Screen, Urine: NEGATIVE (ref <300)
Opiate Scrn, Ur: NEGATIVE (ref <300)
PCP Screen, Urine: NEGATIVE (ref <25)
Propoxyphene Scrn, Ur: NEGATIVE (ref <300)
pH, UA: 6

## 2014-08-03 LAB — ETHANOL: Ethanol Lvl: NOT DETECTED mg/dL

## 2014-08-03 MED ADMIN — 0.9 % sodium chloride bolus: 1000 mL | INTRAVENOUS | @ 17:00:00 | NDC 00264780000
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
## 2014-08-03 NOTE — ED Notes (Signed)
Police remain with patient.    Lesia Sago, RN  08/03/14 [phone]

## 2014-08-03 NOTE — ED Notes (Signed)
Patient talking with Emergency planning/management officer, cooperative currently, R arm handcuffed to bed, distal circulation intact.    Lesia Sago, RN  08/03/14 [phone]

## 2014-08-03 NOTE — ED Notes (Signed)
Police remain with patient.    Lesia Sago, RN  08/03/14 [phone]

## 2014-08-03 NOTE — ED Notes (Signed)
Critical lab called to Dr. Conley Rolls that urine ketones were greater than 80. Pt has been given IVF.     Casilda Carls, RN  08/03/14 1344

## 2014-08-03 NOTE — ED Notes (Signed)
Patient states that he has not had anything to eat or drink since Friday, unable to void currently.  Tongue and mucous membranes moist.    Lesia Sago, RN  08/03/14 1235
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
## 2014-08-03 NOTE — Discharge Instructions (Signed)
Alcohol and Drug Problems: After Your Visit  Your Care Instructions  You can improve your life and health by stopping your use of alcohol or drugs. Ending dependency on alcohol or drugs may help you feel and sleep better. You may get along better with your family, friends, and coworkers. There are medicines and programs that can help.  Follow-up care is a key part of your treatment and safety. Be sure to make and go to all appointments, and call your doctor if you are having problems. It's also a good idea to know your test results and keep a list of the medicines you take.  How can you care for yourself at home?   If you have been given medicine to help keep you sober or reduce your cravings, be sure to take it as prescribed.   Talk to your doctor about programs that can help you stop using drugs or drinking alcohol.   If your doctor prescribes disulfiram (Antabuse), do not drink any alcohol while you are taking this medicine. You may have severe, even life-threatening, side effects from even small amounts of alcohol.   Do not tempt yourself by keeping alcohol or drugs in your home.   Learn how to say no when other people drink or use drugs. Or don't spend time with people who drink or use drugs.   Use the time and money spent on drinking or drugs to do something fun with your family or friends.  Preventing a relapse   Do not drink alcohol or use drugs at all. Using any amount of alcohol or drugs greatly increases your risk for relapse.   Seek help from organizations such as Alcoholics Anonymous, Narcotics Anonymous, or treatment facilities if you feel the need to drink alcohol or use drugs again.   Remember that recovery is a lifelong process.   Stay away from situations, friends, or places that may lead you to drink or use drugs.   Have a plan to spot and deal with relapse. Learn to recognize changes in your thinking that lead you to drink or use drugs. These are warning signs. Get help before you  start to drink or use drugs again.   Get help as soon as you can if you relapse. Some people make a plan with another person that outlines what they want that person to do for them if they relapse. The plan usually includes how to handle the relapse and who to notify in case of relapse.   Don't give up. Remember that a relapse does not mean that you have failed. Use the experience to learn the triggers that lead you to drink or use drugs. Then quit again. Many people have several relapses before they are able to quit for good.  When should you call for help?  Call 911 anytime you think you may need emergency care. For example, call if:   You feel you cannot stop from hurting yourself or someone else.  Call your doctor now or seek immediate medical care if:   You have serious withdrawal symptoms such as confusion, hallucinations, or severe trembling.  Watch closely for changes in your health, and be sure to contact your doctor if:   You have a relapse.   You need more help or support to stop.   Where can you learn more?   Go to [email or web address] and sign in to your MyChart account. Enter H573 in the [place] box to learn more about "Alcohol and Drug  Problems: After Your Visit."    If you do not have an account, please click on the "Sign Up Now" link.      2006-2015 Healthwise, Incorporated. Care instructions adapted under license by [facility]. This care instruction is for use with your licensed healthcare professional. If you have questions about a medical condition or this instruction, always ask your healthcare professional. Healthwise, Incorporated disclaims any warranty or liability for your use of this information.  Content Version: 10.5.422740; Current as of: January 24, 2014      OUTPATIENT MENTAL HEALTH  [place]  (updated 10/24/2012)  MHAP [phone] Health Access Point: All initial referrals for community mental health services must go through  [facility].  Crisis Intervention  . Crisis Hotline:  119-147-8295(AOZH)  . Adult Mobile Crisis [phone] (through [facility])   [facility] Mental Health Access Point  Located: [facility] [place], [address], [place] of [facility].    Phone: [phone] Fax: [phone]  Director: Mervin Kung, MSW, LISW-S  Website:  ShapeHeads.it  General Information: As the front door to the [facility] public mental health system, Mental Health Access Point (MHAP), a division of [address], provides assessment, support, and connections for individuals and families who are in need of mental health services. MHAP's primary mission is to provide a standardized entry to a managed system of care that ensures services are available, accessible, and of high quality.  MHAP operates 24 hours a day, seven days a week to answer calls and connect consumers to appropriate services. To access MHAP, call [phone].  Primary Functions: MHAP's primary functions are:  . Assessments, authorization for and connection to appropriate mental health services   . Utilization review and monitoring of publicly-funded mental health services   . Quality improvement and assurance through clinical and statistical review   . MACSIS data enrollment to provide benefits to consumers entering the public mental health system  Available Services: As the front door for access into public mental health services in [place], [place] provides the following services:  [place]land Kitchen Mental health assessments   . Transitional case management and medication management services for children and adults who have immediate mental health needs so consumers can experience a seamless progression of service delivery   . Triage of mental health referrals from community providers for intensive services   . Housing assessments for adult consumers currently connected to case management services requesting to move  into Adult Care Homes   . Application assistance for medical and disability benefits (Medicaid and Social Security)  FAIR: As part of a collaborative effort between the [facility] Mental Health and Recovery Services Board and the [facility] Job and Family Services (HCJFS) as well as a partnership between [address] and the [employer], FAIR became operational on January 05, 2009.  HOPE: HOPE for Children and Families (HOPE) is a Microbiologist and is responsible for the referral, connection, monitoring and coordination of services for children and youth who are involved in two or more county system    [facility] [place] [facility] (several locations throughout city)  Many services offered: counseling, psychiatry, case management, housing assist  [facility]  2673875149    [facility]  221-HELP [phone]) call central registration for appointment, 5 locations available    [facility] (located at [facility])  [phone]    [facility] Board  [phone]-for general information and all services (M-F 8:30-5)  [phone]-  adult outpatient intake    [facility]   [phone]    [facility] [place] (they have many locations throughout the city, call for office nearest you or office that can offer the first appointment)  [phone]    [facility]  [phone] (numerous programs including residential, halfway house, substance abuse, adolescent and adult)  [phone]  [place] [employer]  Website:  https://www.koch.com/  Phone:  513-221-HELP ([phone])027-1142) for more information or to schedule an appointment.  513-281-CARE ([phone]) or text Talbert to 841660 (textme) if in crisis or in need of immediate assistance. Available 24 hours-a-day.  Location:  Special educational needs teacher location is:  [address]  [place] , [place] [address]  Phone: [phone]  Fax: [phone]   Benard Halsted is a community-wide nonprofit that operates within five service lines: Adult Set designer, [employer], Contractor and [employer], Housing, and [employer]. Children, adults and families benefit from these proven services.  Each year, Benard Halsted serves over 31,000 clients face to face and an additional 51,000 are reached through prevention services throughout [place]. The [employer] mission has two basic purposes: to improve social behavior and enhance personal recovery and growth.  [employer] creates innovative, evidenced-based programs that are proven to solve tough social problems that impact all members of the community. Through comprehensive and proven solutions to behavioral health challenges, Benard Halsted is building a stronger community.one life at a time.  SERVICES  The [employer] business model is comprised of three classifications of services: programs, enterprises and affiliations. The agency adheres to this model to operate more effectively and better serve the community.  PROGRAMS & PARTNERSHIPS - [employer] provides a variety of services in the areas of community corrections, mental health, substance abuse and reintergration services.  ENTERPRISES - [employer] enterprises are social enterprises that support [employer] while enhancing personal recovery and growth.   AFFILIATIONS - To address the realities of limited funding and the cost-containment demands of funders, [employer] developed an affiliation Gateways  [employer] [facility]  [phone]  A [facility] partner through the Family Peer Support program, [facility] offers parents guidance and support by providing information and services tailored to meet parents needs, culture and interests. Direct services and online resources are available, and some costs are included. www.beechacres.[facility]  [phone]  A [facility] partner through the [employer] Crisis Team program, [address] has been providing quality behavioral health and forensic services for over 80 years to children, adults and families. rosori.com  [facility] Board of [place]  ([phone]  A [facility] partner through the ADAPT program, the [employer] Board's mission is to provide Engineer, production and comprehensive behavioral health programs that support a strong, well functioning community and assist residents of [place] and surrounding areas to achieve their greatest potential and well-being. SharperDiscounts.dk  [facility] [place]  ([phone]  A partner through [place], [facility] [place] is a private, non-profit social service agency that improves the lives of children and their families through services in four distinct areas: Adoption, Early Childhood, Education and Mental Health. www.thechildrenshomecinti.org  [place] [facility] (CYC)  516-244-2505  A [facility] partner through the [place] program, CYC is comprised of two main programs: Insurance risk surveyor. These programs work together to ensure the best possible outcomes for 's  youth. www.cycyouth.org  Council of Christian Communions of [place]  510-180-4192  A [employer] partner through the Children of Incarcerated Parents program, the Council of Ephri[place] Knuckles Communions is composed of churches and other religious bodies working together to serve the community through organized Doctor, hospital and [employer] in education, Advice worker. VegetarianPizzas.hu  [facility]  262-371-4149  A [facility] partner through the Mental Health Court Day Reporting program and Community Link, [facility] provides residential short-term and long-term substance abuse treatment. Programs treat adolescents, persons with  co-occurring mental and substance abuse disorders, pregnant/postpartum women, and women.  Dress for Success  [phone]  A [employer] partner through the Workforce Reentry program, Dress for Success strives to advance low-income women's economic and social development and to encourage self-sufficiency through career development and employment retention. Dress for Success responds to the needs of the community by providing programs that help economically disadvantaged women acquire jobs, retain their new positions and succeed in the [employer]. http://dfscincy.Norlene Campbell Work [employer])  (223)424-7735  A [employer] partner through the [employer], [facility] offers programs for individuals with disabilities, individuals with disadvantages and at-risk youth. http://swohio.easterseals.com  [facility]  6193735617  A [facility] partner through the ADAPT program. [facility] is a long-term residential drug and alcohol treatment program for women and their children, serving [place]. www.firststephome.org  [facility]  216-257-6843  A [facility] partner through the Mental Health Court Day Reporting program and Community Link, [facility] [place] [facility] offers a host of programs, including case management, residential programs, and nursing services. www.gcbhs.com  [facility] Court  [phone]  The local Mental Health Court is a [employer] partner through the Mental Health Court Day Reporting program.  www.jvscinti.org  [employer] and [employer] (OJPC)  (586) [phone]  A [facility] partner through the Workforce Reentry program, Jeannett Senior is a Conservation officer, nature, nonprofit, Health visitor office based in [place]. SprayLotions.dk  [employer]  4052550167  A [facility] partner through the ADAPT program, it is a substance abuse treatment and residential  long-term treatment for men. www.prospect-house.org  Recovery Link  (513) 707-304-3897  A [employer] partner through the [employer], Recovery Link offers services such as IT support, database administration and development, and data entry along with group purchasing, administrative support, and bookkeeping services to [employer] in the [place] area. www.recoverylink.org  Transformation [place]/ [place]  3473505183  A [employer] partner through the Workforce Reentry program, Transformation [place]'s vision is [place] and [place] will be transformed into a Christ-centered city of prayer that provides refuge for all people. www.transformationcincinnati.com  [employer] of [place]  (773)711-9662  A [facility] partner through the Children of Incarcerated Parents (CIP) and the Workforce Reentry programs, [employer] of [address] works to eliminate the barriers of racism and level the playing field for all African Americans and others at risk by promoting their economic self-sufficiency and entrepreneurship through [employer] in the areas of comprehensive employment, youth and family development, and Sales promotion account executive. www.gcul.[facility]  [facility]  ([phone]  A [facility] partner through [facility] and [place], the [employer] mission is to [employer] by providing exceptional health care that improves their health and well-being.  http://hendricks-barton.net/

## 2014-08-05 NOTE — Progress Notes (Signed)
Labs reviewed by Dr. Bender.

## 2015-03-24 NOTE — H&P (Signed)
PATIENT NAME:  Lucas Newton, WOLTERS MR#:  [id number] DATE OF BIRTH:  1986/05/05  DATE OF ADMISSION:  07/19/2012  IDENTIFYING INFORMATION AND CHIEF COMPLAINT: This is a 29 year old man with a history of recurrent symptoms of depression and anxiety as well as a history of substance abuse who presents to the emergency room today with complaints of depression and suicidal thoughts.   CHIEF COMPLAINT: "I just don't feel like I can make it."   HISTORY OF PRESENT ILLNESS: History obtained from the patient and from the old chart. The patient reports that for the last couple of weeks his mood has been feeling more depressed. He says that it is the anniversary of his father's death a couple of weeks ago, and that ever since then he has been feeling more sad. His mood feels sad and down most of the time. He feels tired. He feels hopeless. He says he has suicidal thoughts which are constant all the time, although without any specific plan. He says the only thing that keeps him from killing himself is not wanting to hurt his mother. He is not currently getting any kind of psychiatric treatment. He last got psychiatric treatment briefly when he was living in [place] but did not follow up with any outpatient treatment. He is not currently on any psychiatric medicine. He admits that he drank some beer recently and has been drinking sporadically. He minimizes the degree to which this is a problem. He admits that he used some marijuana. He claims it was over a couple of weeks ago that he did that.   PAST PSYCHIATRIC HISTORY: The patient has had multiple admissions to our hospital usually under similar circumstances. The actual diagnosis is unclear. He tends to get better pretty quickly after coming into the hospital. Previous stays have often been marked by substance abuse as a complicating factor. He has been treated with multiple medications including Remeron, Effexor, and Tegretol. He says that he thinks that they have been  helpful in the past but he cannot remember exactly which ones. Looking back at the old chart, it looks like almost everything that he has taken has been at some point labeled as being possibly helpful but obviously has not been giving him complete relief. 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 He has a history of substance abuse and has had minimal insight into it although he has been referred to inpatient substance abuse treatment in the past. He does have a past history that he reports of serious suicide attempts including hanging, overdose, and cutting himself although none of those have occurred around his hospitalizations here. He does not tend to follow up with outpatient treatment. Apparently he did briefly follow up with [facility] in [place] but otherwise he has not.   SOCIAL HISTORY: The patient has most recently been living with his sister and mother. He just moved down here from [place] fairly recently. He says that in [place] he felt happy because he had a job, but he has not been working since he has been down here. His father is deceased, and two of his brothers are deceased from suicide attempts. The patient never completed high school, has no formal job training. He says he has not been able to get a job since being down here. He has no children, is not married.   MEDICAL HISTORY: Has no significant ongoing medical problems.   SUBSTANCE ABUSE HISTORY: History of abuse of multiple drugs, including alcohol, benzodiazepines,  opiates, cannabis, cocaine. Not clear that any of them have been a constant single out of control obsession but they certainly have influenced his mood.   CURRENT MEDICATIONS: None.   ALLERGIES: None.   FAMILY HISTORY: Two brothers committed suicide.   REVIEW OF SYSTEMS: Complains that his mood is depressed. Says he feels run down and hopeless. Says he has constant suicidal thoughts. Denies any psychotic symptoms.    ,MENTAL STATUS EXAMINATION: A somewhat disheveled young man, looks his stated age. Cooperative with the interview. Alert and oriented and awake throughout the interview. Short and long term memory grossly intact. Probably average intelligence. Affect reactive, maybe a little bit constricted. Mood stated as being depressed. Thoughts are lucid with no loosening of associations. No sign of delusional thinking. No paranoia. Denies auditory or visual hallucinations. Denies suicide plan but says he does have constant thoughts about wishing he was dead. Denies homicidal ideation. Judgment and insight somewhat impaired.   PHYSICAL EXAMINATION:   GENERAL: Young man who looks his stated age. Does not appear to be in any acute distress. Has acne and tattoos. No obvious acute skin lesions.   HEENT: Pupils are equal and reactive. Face is symmetric. His teeth are misaligned and he shows me an area on his lower jaw where he says he is having acute dental pain that does look like it could be an infected tooth. Some tenderness to palpation on the left side of his lower jaw.   LUNGS: Clear to auscultation with no wheezes.   HEART: Regular rate and rhythm with normal sounds.   ABDOMEN: Soft, nontender, normal bowel sounds.   EXTREMITIES: Gait is within normal limits. Normal range of motion at all extremities. Strength and reflexes normal and symmetric.   NEUROLOGICAL: Cranial nerves intact and symmetric.   VITAL SIGNS: Temperature 98.9, pulse 88, respirations 18, blood pressure 161/97.   LABORATORY RESULTS: His drug screen is positive for cannabis. TSH normal. Alcohol undetectable. Alkaline phosphatase, ALT, AST are all elevated, unclear what that is from. CBC is all normal. Urinalysis is not back but the salicylates and acetaminophen are negative.   ASSESSMENT: This is a 29 year old man with chronic depression and anxiety, dysfunctional behavior, dysfunctional personality. Chronic mixed substance abuse. He  returns to the hospital stating suicidal ideation. Given the chronicity of this it is not entirely clear what might be going on. I suspect that he may have had trouble getting along with his sister or mother or had some other acute stressor or that he may have been drinking more than he will admit. The patient needs hospitalization for stabilization and management of any acute suicide risk and referral to outpatient treatment.   TREATMENT PLAN: Admit to Psychiatry. It is not clear what if any psychiatric medicine is really indicated. Vitals will be monitored. He will be engaged in groups and activities. He will not be given any abusable drugs during his hospital stay unless he needs detox. Education and support will be done. We will work on an appropriate discharge plan, hopefully getting some collateral information from his family.   DIAGNOSIS PRINCIPLE AND PRIMARY:  AXIS I: Depression, not otherwise specified.   SECONDARY DIAGNOSES:  AXIS I: Polysubstance dependence.  AXIS II: Dependent traits.  AXIS III: No diagnosis.  AXIS IV: Chronic stress from lack of an income, lack of resources.  AXIS V: Functioning at time of assessment 30.     ____________________________ Audery Amel, MD jtc:vtd D: 07/19/2012 17:11:52 ET T: 07/19/2012 17:55:47 ET JOB#: [id number]  cc: Audery AmelJohn T. Kmari Brian, MD, <Dictator> Audery AmelJOHN T Moncia Annas MD ELECTRONICALLY SIGNED 07/20/2012 16:28

## 2015-03-24 NOTE — Discharge Summary (Signed)
PATIENT NAME:  Lucas Newton, Lucas Newton MR#:  [id number] DATE OF BIRTH:  [birth year]  DATE OF ADMISSION:  07/19/2012 DATE OF DISCHARGE:  07/25/2012  HOSPITAL COURSE: See the dictated history and physical for details of admission. This 29 year old man with a history of depression and mood instability and substance abuse came to the Emergency Room reporting suicidal ideation and mood instability. In the hospital, he was treated with medication for depression and also engaged in group therapy. He has attended groups and worked on Science writercoping skills and anger management. He has been able to discuss his past history of trauma and the need for him to continue to work on that in order to function better. He has attended some substance abuse groups and has shown improved insight into the need for him to stay sober in the foreseeable future. He has not reported any acute suicidal ideation and not behaved in a suicidal manner. At this point, the patient is tolerating his current medication regimen of fluoxetine primarily as well as trazodone p.r.n. at night. He [facility]. He will be staying back with his family. The patient has been treated with an antibiotic for what appears to be an infected tooth. This will be continued and he is strongly encouraged to follow-up and see a dentist as soon as possible.   DISCHARGE MEDICATIONS:  1. Fluoxetine 40 mg per day.  2. Trazodone 150 mg at night.  3. Vistaril 50 mg every six hours p.r.n. anxiety. 4. Amoxicillin-Clavulanate acid 875 mg twice a day.   RESULTS OF LABORATORY TESTS: Labs on admission showed a drug screen positive for cannabis. TSH normal. Alcohol undetectable. Chemistry showed an elevated alkaline phosphatase, elevated ALT, and elevated AST all likely related to recent alcohol use. CBC unremarkable. Urinalysis unremarkable.   MENTAL STATUS EXAMINATION: Status casually dressed, neatly groomed man who looks his stated  age. Cooperative with the interview. Psychomotor activity normal. Eye contact normal. Affect slightly blunted. Mood stated as okay. Thoughts are lucid with no evidence of loosening of associations or delusional thinking. Denies suicidal or homicidal ideation. Denies any acute psychotic symptoms. Expresses optimism and hope for the future. Shows improved judgment and insight. Intelligence average. Short and long-term memory grossly intact.   DISPOSITION: He is discharged back home with his family and will follow up with [facility].  DIAGNOSIS PRINCIPLE AND PRIMARY:   AXIS I: Depression not otherwise specified.   SECONDARY DIAGNOSES:  1. Rule out posttraumatic stress disorder.  2. Marijuana abuse.   AXIS II: Deferred borderline traits clearly identified.   AXIS III: Infected tooth.  AXIS IV: Severe stress from chaotic living situation and lack of financial resources.   AXIS V: Functioning at time of discharge 55.  ____________________________ Lucas AmelJohn T. Amonte Brookover, MD jtc:slb Newton: 07/25/2012 12:50:11 ET T: 07/25/2012 13:33:46 ET JOB#: [id number]  cc: Lucas AmelJohn T. Lenox Ladouceur, MD, <Dictator> Lucas AmelJOHN T Alastair Hennes MD ELECTRONICALLY SIGNED 07/25/2012 17:44

## 2015-08-07 ENCOUNTER — Inpatient Hospital Stay
Admit: 2015-08-07 | Discharge: 2015-08-07 | Disposition: A | Discharge status: Home / Self Care | Attending: Emergency Medicine

## 2015-08-07 ENCOUNTER — Encounter: Admit: 2015-08-07

## 2015-08-07 DIAGNOSIS — W19XXXA Unspecified fall, initial encounter: Secondary | ICD-10-CM

## 2015-08-07 MED ORDER — BACITRACIN-POLYMYXIN B 500-10000 UNIT/GM EX OINT
500-10000 UNIT/GM | CUTANEOUS | Status: AC
Start: 2015-08-07 — End: 2015-08-07
  Administered 2015-08-07: 15:00:00

## 2015-08-07 MED ORDER — TETANUS-DIPHTH-ACELL PERTUSSIS 5-2.5-18.5 LF-MCG/0.5 IM SUSP
Freq: Once | INTRAMUSCULAR | Status: AC
Start: 2015-08-07 — End: 2015-08-07

## 2015-08-07 MED ORDER — IBUPROFEN 600 MG PO TABS
600 MG | ORAL_TABLET | Freq: Four times a day (QID) | ORAL | 0 refills | Status: AC | PRN
Start: 2015-08-07 — End: ?

## 2015-08-07 MED ORDER — TETANUS-DIPHTH-ACELL PERTUSSIS 5-2.5-18.5 LF-MCG/0.5 IM SUSP
INTRAMUSCULAR | Status: AC
Start: 2015-08-07 — End: 2015-08-07
  Administered 2015-08-07: 17:00:00 0.5 via INTRAMUSCULAR

## 2015-08-07 MED FILL — DOUBLE ANTIBIOTIC 500-10000 UNIT/GM EX OINT: 500-10000 UNIT/GM | CUTANEOUS | Qty: 1

## 2015-08-07 MED FILL — BOOSTRIX 5-2.5-18.5 IM SUSP: INTRAMUSCULAR | Qty: 0.5
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
## 2015-08-14 ENCOUNTER — Ambulatory Visit: Admit: 2015-08-14 | Payer: MEDICAID

## 2015-08-14 ENCOUNTER — Ambulatory Visit: Admit: 2015-08-14 | Discharge: 2015-08-14 | Payer: MEDICAID | Attending: Orthopaedic Surgery

## 2015-08-14 DIAGNOSIS — M25521 Pain in right elbow: Secondary | ICD-10-CM

## 2015-08-14 NOTE — Progress Notes (Signed)
Chief Complaint    Elbow Pain - np, lt elbow      History of Present Illness:  Brett [name] is a 29 y.o. male here for consultation, evaluation and treatment for his right elbow pain.  The patient is right-hand dominant.  The patient injured his right elbow while jumping over a fence on 08/07/2015.  He did go to the emergency room for this and was found to have a right proximal radial head fracture.  He was given a sling to wear was asked to follow-up with orthopedics.  He is currently 8 days post his injury day.  He reports he's continues to have pain associated with swelling as well as limited range of motion.  He denies any numbness or tingling down his arm.     Medical History:    No notes on file    Patient's medications, allergies, past medical, surgical, social and family histories were reviewed and updated as appropriate.    Past Medical History   Diagnosis Date   ??? Anxiety    ??? Depression    ??? Elevated LFTs    ??? Hepatitis C    ??? Hypertension    ??? PTSD (post-traumatic stress disorder)    ??? Thyroid disease       Social History     Social History   ??? Marital status: Single     Spouse name: N/A   ??? Number of children: 0   ??? 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 Smoking status: Current Every Day Smoker     Packs/day: 2.00     Years: 12.00     Types: Cigarettes   ??? Smokeless tobacco: Never Used   ??? Alcohol use 50.4 oz/week     84 Cans of beer per week      Comment: 12 pk per day   ??? Drug use: 14.00 per week     Special: Marijuana      Comment: heroin   ??? Sexual activity: Not on file     Other Topics Concern   ??? Not on file     Social History Narrative       No Known Allergies  Current Outpatient Prescriptions on File Prior to Visit   Medication Sig Dispense Refill   ??? ibuprofen (ADVIL;MOTRIN) 600 MG tablet Take 1 tablet by mouth every 6 hours as needed for Pain 40 tablet 0   ??? QUEtiapine (SEROQUEL) 300 MG tablet Take 300 mg by mouth 2 times daily      ??? lamoTRIgine (LAMICTAL) 100 MG tablet Take 100 mg by mouth daily     ??? clonazePAM (KLONOPIN) 1 MG tablet Take 1 mg by mouth 2 times daily as needed for Anxiety     ??? benztropine (COGENTIN) 1 MG tablet Take 1 tablet by mouth 2 times daily as needed (eps) 10 tablet 0   ??? cloNIDine (CATAPRES) 0.1 MG tablet Take 1 tablet by mouth 2 times daily 60 tablet 1   ??? traZODone (DESYREL) 50 MG tablet Take 1 tablet by mouth nightly as needed for Sleep 30 tablet 0     No current facility-administered medications on file prior to visit.        Review of Systems:  Relevant review of systems reviewed and available in the patient's chart    Vital Signs:  Vitals:    08/14/15 0941  BP: 139/87   Pulse:        General Exam:   Constitutional: Patient is adequately groomed with no evidence of malnutrition  DTRs: Deep tendon reflexes are intact  Mental Status: The patient is oriented to time, place and person.  The patient's mood and affect are appropriate.  Lymphatic: The lymphatic examination bilaterally reveals all areas to be without enlargement or induration.  Vascular: Examination reveals no swelling or calf tenderness.  Peripheral pulses are palpable and 2+.  Neurological: The patient has good coordination.  There is no weakness or sensory deficit.      Right elbow Examination:    Inspection:  He has mild soft tissue swelling over the right elbow.  There is no gross deformities.  He does have a 1 cm laceration long with an abrasion.  It is healing well without drainage or erythema.  No signs of infection.    Palpation:  He is tender for proximal radial head.  He has no tenderness over the olecranon process.  His biceps and triceps tendon are intact.    Active Range of Motion: He is limited in flexion and extension.  He lacks 20?? of full extension and has flexion to 70??.  He lacks full supination and pronation.    Passive Range of Motion:  Deferred    Strength:  3/5 grip strength in comparison to his left.    Special Tests:   Deferred    Neurovascular: The patient light touch is fully intact, palpable radial pulses 2+.  No motor deficits.      Comparison left elbow Examination:  Examination of the contralateral extremity does not show any tenderness, deformity or injury. Range of motion is unremarkable. There is no gross instability. There are no rashes, ulcerations or lesions. Strength and tone are normal.    Radiology:     Plain radiographs of the right elbow, comprising 3 views: AP lateral and oblique were  obtained and reviewed in the office:   Soft tissue swelling can still be seen.  Nondisplaced radial head fracture is present with interval healing.  No dislocation of the ulnohumeral joint    Assessment :  The patient is a 29 year old gentleman who is 8 days following a closed nondisplaced proximal  radial head fracture    Impression:  Encounter Diagnoses   Name Primary?   ??? Right elbow pain Yes   ??? Closed fracture of head of right radius, initial encounter        Office Procedures:  Orders Placed This Encounter   Procedures   ??? Elbow Right 3V     73080     Order Specific Question:   Reason for exam:     Answer:   Elbow Pain, Dr. Sharol Given, Pod 2 Room 1,   ??? CT Elbow Left WO Contrast     Standing Status:   Future     Standing Expiration Date:   08/13/2016     Order Specific Question:   Reason for exam:     Answer:   right radial head fracture   ??? [facility] [place] Octave MD (Hand, Wrist, Elbow)     Referral Priority:   Routine     Referral Type:   Consult for Advice and Opinion     Referral Reason:   Specialty Services Required     Referred to Provider:   Rolm Gala, MD     Requested Specialty:   Orthopedic Surgery     Number of  Visits Requested:   1       Treatment Plan:  We will proceed to get a CT scan of the right elbow and will refer him to Dr. Clarice Pole.  In the meantime we will have him continue to wear his sling at all times.  He was also advised to continue his anti-inflammatory for discomfort.  He was also encouraged to  use cryotherapy throughout the day as an analgesia.  All his questions were fully answered today.  He was agreeable to the above plan.           Shon Hale, PA-C    This dictation was performed with a verbal recognition program (DRAGON) and it was checked for errors.  It is possible that there are still dictated errors within this office note.  If so, please bring any errors to my attention for an addendum.  All efforts were made to ensure that this office note is accurate.    Supervising Physician Attestation:  I personally supervised the Orthopaedic Sports Medicine Physician Assistant in the evaluation and development of a treatment plan for this patient. I personally interviewed the patient and performed a physical examination. In addition, I discussed the patient's condition and treatment options with them. I have also reviewed and agree with the past medical, family and social history unless otherwise noted. All of the patient's questions were answered.     Agree with above.  Saim is a very nice 12 old gentleman who appears have a right elbow minimally displaced radial head fracture.  In the office today, he has limited supination and pronation.  I'm going to send him for a CT scan to better assess his fracture.  I also going to send him to one of my hand and elbow partners      Esaw Grandchild, MD  Orthopaedic Surgeon, Sports Medicine  Director, [facility]  [facility] Orthopaedic [facility] [employer]  TEFL teacher (Administrative Asst) [phone]

## 2015-08-24 ENCOUNTER — Ambulatory Visit: Admit: 2015-08-24 | Payer: MEDICAID

## 2015-08-24 ENCOUNTER — Inpatient Hospital Stay: Attending: Rehabilitative and Restorative Service Providers"

## 2015-08-24 ENCOUNTER — Ambulatory Visit: Admit: 2015-08-24 | Discharge: 2015-08-24 | Payer: MEDICAID | Attending: Orthopaedic Surgery

## 2015-08-24 DIAGNOSIS — M25521 Pain in right elbow: Secondary | ICD-10-CM

## 2015-08-24 NOTE — Progress Notes (Addendum)
Chief complaint: Right elbow injury  He is seen today regarding an injury occurring on September 2nd, 2016.  He reports injuring his right Elbow, having fell after jumping over a barb wire fence and landing onto his right upper extremity while running.   He did not note any neurologic symptoms at that time.  He was seen for Emergency evaluation elsewhere where radiographs were obtained & he was immobilized.  He reports mild pain located in the  lateral aspect of the distal arm & elbow, there is not tenderness at the wrist but he does have some referred pain from the elbow into his wrist at times, no tenderness elsewhere in the injured extremity.  He notes today, no neurologic symptoms in the Whole Hand. Symptoms are improving over time.    The patient is a referral from Dr. Sharol Given who evaluated the patient for minimally displaced radial head fracture. The patient has been in a sling most of the time but has been working essentially with 1 hand and coming out of the sling while at home.    The patient's , past medical history, medications, allergies,  family history, social history, and review of systems have been reviewed, and dated and are recorded in the chart.    Physical Exam:  Mr. LUKA STOHR most recent vitals:  Vitals  BP: (!) 157/106  Pulse: 72  Height:  (175.3 cm)  Weight: 173 lb (78.5 kg)    He is well nourished, oriented to person, place & time.  He demonstrates appropriate mood and affect as well as normal gait and station.    Skin: Skin color, texture, turgor normal. No rashes or lesions in the injured limb, normal on the contralateral side  Digital range of motion is full bilaterally  Wrist range of motion is full bilaterally  Elbow range of motion is flexion arc 20?? to 130?? , pronation 70 and supination 70 with mild pain in maximal supination and no mechanical block on the Right, normal on the Left  Sensation is subjectively normal in the Whole Hand bilaterally  Vascular examination reveals  normal, good capillary refill and radial pulse present bilaterally  Muscular strength is limited by pain on the Right, greater than Left with resisted pronation supination  Swelling & Ecchymosis is absent in the global aspect of the distal arm & elbow bilaterally  Maximal pain is elicited with palpation of the right global elbow.  Specifically, the Radial Head is mildly Tender, the Olecranon Process is not Tender, the Medial Epicondyle is not Tender, the Lateral Epicondyle is not Tender.  The bony structures of the wrist are not tender to palpation.  There is no instability of the Distal Radial Ulnar Joint bilaterally      Radiographic Evaluation:  Radiographs are reviewed  today (3 views of the right elbow).  They do demonstrate evidence of an acute fracture of the right radial head 2 part fracture with minimal displacement and less than 2 mm step-off on radiocapitellar view.  No evidence of radiocapitellar subluxation or malalignment There is no comminution and displacement.  The other bony structures of the elbow show no evidence of acute fracture.  Three views of right wrist: No acute fracture, dislocation, or malalignment noted. No evidence of degenerative changes in the right wrist    Impression:  Mr. OMID DEARDORFF has sustained recent minimally displaced right radial head fracture and presents requesting further treatment.    Plan:  I discussed with the patient the diagnosis based  on imaging as well as his history and exam. He is now 2-1/2 weeks from his injury and it appears that there is minimal displacement of fracture. On exam he has no mechanical block to his motion. He does have some limitation of elbow extension terminally. I recommend nonoperative treatment at this point based on these factors. The patient can use a sling for comfort but I encouraged the patient to come out of the sling at least 3-4 times a day to work on elbow range of motion, specifically elbow extension. I will send him to  therapy so that they can do some teaching for home exercises to work on elbow range of motion. No lifting with that right upper extremity at this time. He can do one-handed duty at work. I would like to follow up with him in 2 weeks to ensure that there is improvement in range of motion as well as continued improvement in his symptoms

## 2015-08-26 ENCOUNTER — Encounter: Attending: Rehabilitative and Restorative Service Providers"

## 2015-09-07 ENCOUNTER — Ambulatory Visit: Admit: 2015-09-07 | Payer: MEDICAID

## 2015-09-07 ENCOUNTER — Ambulatory Visit: Admit: 2015-09-07 | Discharge: 2015-09-07 | Payer: MEDICAID | Attending: Orthopaedic Surgery

## 2015-09-07 DIAGNOSIS — S52121S Displaced fracture of head of right radius, sequela: Secondary | ICD-10-CM

## 2015-09-07 NOTE — Progress Notes (Signed)
Chief Complaint   Patient presents with   ??? Elbow Pain     Right elbow pain - still compains about unable to straighten elbow completely       HISTORY OF PRESENT ILLNESS:  Brett [name] is a 29 y.o.  patient here for repeat evaluation of his right elbow injury sustained now about 4 weeks ago. The mechanism of injury was the patient fell onto the elbow after jumping a fence. His images obtained and noted at the last visit demonstrated a minimally displaced radial head fracture involving about 10-20% of the joint with step-off minimal. The patient after discussion we opted for nonoperative treatment due to the acceptable alignment. Since her last visit, the patient has overall been doing well. He did not go to formal  occupational therapy but has been working on his own with range of motion of the elbow. He also admittedly has been using the elbow and right upper extremity for torquing a wrench at times and doing some lifting. He reports some occasional pain on lateral aspect of his elbow but overall it is improved since his last visit. He denies a specific mechanical symptoms in the right elbow but a patient with pain with maximal supination    ROS:  ROS neg     Past medical history is reviewed again today, no changes to report    PHYSICAL EXAMINATION:  Patient is alert and pleasant, in no acute distress.  The affected extremity is examined today.    Right upper extremity: Minimal tenderness to palpation at the radiocapitellar joint  No crepitus or mechanical block with passive pronation supination  No swelling or other skin changes noted around the elbow, forearm, wrist  Range of motion: 0-130?? of flexion  70?? supination and 80?? of pronation with minimal pain and maximal supination  Active 5 out of 5 wrist flexion and extension, EPL, FPL, finger flexion and extension and abduction  Sensation grossly intact in median, ulnar, radial nerves  Capillary refill brisk in all fingers    X-rays:  3 views of right elbow  including radiocapitellar view  Minimal interval change in overall alignment with step-off radial head fracture measured less than 1 mm. Fracture involves approximately less than 20% of joint surface  No evidence of angulation or other malalignment noted. No other bony changes noted      IMPRESSION AND PLAN: Right minimally displaced radial head fracture    I discussed with the patient the radiographic findings today as well as his clinical exam. Overall it appears that his fracture stable and he has no mechanical symptoms or increased pain. The patient has been using the arm think more than I advised but has obtained good range of motion as a result. Continue to caution educated patient today to avoid any heavy lifting activities or any torquing of the wrist such as turning a wrench until there is more healing of the fracture. However I would like the patient to continue with range of motion activities in the wrist and elbow. He does not need the sling at this time I would like the patient to continue to be light duty at work with no lifting greater than 5 pounds with that right upper extremity. We will continue with nonoperative treatment and I would like to follow up with the patient about 3 weeks with repeat imaging and to ensure that he continues to progress clinically

## 2015-09-28 ENCOUNTER — Encounter: Attending: Orthopaedic Surgery

## 2016-01-11 ENCOUNTER — Emergency Department
Admission: EM | Admit: 2016-01-11 | Discharge: 2016-01-12 | Disposition: A | Discharge status: Other Acute Inpatient Hospital | Payer: Medicaid - Out of State | Attending: Emergency Medicine | Admitting: Emergency Medicine

## 2016-01-11 ENCOUNTER — Encounter: Payer: Self-pay | Admitting: Emergency Medicine

## 2016-01-11 DIAGNOSIS — F17200 Nicotine dependence, unspecified, uncomplicated: Secondary | ICD-10-CM | POA: Insufficient documentation

## 2016-01-11 DIAGNOSIS — F1410 Cocaine abuse, uncomplicated: Secondary | ICD-10-CM | POA: Insufficient documentation

## 2016-01-11 DIAGNOSIS — F329 Major depressive disorder, single episode, unspecified: Secondary | ICD-10-CM

## 2016-01-11 DIAGNOSIS — F1210 Cannabis abuse, uncomplicated: Secondary | ICD-10-CM | POA: Insufficient documentation

## 2016-01-11 DIAGNOSIS — F32A Depression, unspecified: Secondary | ICD-10-CM

## 2016-01-11 DIAGNOSIS — F1310 Sedative, hypnotic or anxiolytic abuse, uncomplicated: Secondary | ICD-10-CM | POA: Diagnosis not present

## 2016-01-11 DIAGNOSIS — R45851 Suicidal ideations: Secondary | ICD-10-CM | POA: Diagnosis present

## 2016-01-11 DIAGNOSIS — F1994 Other psychoactive substance use, unspecified with psychoactive substance-induced mood disorder: Secondary | ICD-10-CM

## 2016-01-11 HISTORY — DX: Major depressive disorder, single episode, unspecified: F32.9

## 2016-01-11 HISTORY — DX: Depression, unspecified: F32.A

## 2016-01-11 HISTORY — DX: Suicide attempt, initial encounter: T14.91XA

## 2016-01-11 HISTORY — DX: Unspecified viral hepatitis C without hepatic coma: B19.20

## 2016-01-11 LAB — URINE DRUG SCREEN, QUALITATIVE (ARMC ONLY)
AMPHETAMINES, UR SCREEN: NOT DETECTED
BENZODIAZEPINE, UR SCRN: POSITIVE — AB
Barbiturates, Ur Screen: NOT DETECTED
Cannabinoid 50 Ng, Ur ~~LOC~~: POSITIVE — AB
Cocaine Metabolite,Ur ~~LOC~~: POSITIVE — AB
MDMA (Ecstasy)Ur Screen: NOT DETECTED
METHADONE SCREEN, URINE: NOT DETECTED
Opiate, Ur Screen: NOT DETECTED
Phencyclidine (PCP) Ur S: NOT DETECTED
TRICYCLIC, UR SCREEN: NOT DETECTED

## 2016-01-11 LAB — COMPREHENSIVE METABOLIC PANEL
ALBUMIN: 4.7 g/dL (ref 3.5–5.0)
ALT: 637 U/L — ABNORMAL HIGH (ref 17–63)
ANION GAP: 12 (ref 5–15)
AST: 304 U/L — ABNORMAL HIGH (ref 15–41)
Alkaline Phosphatase: 108 U/L (ref 38–126)
BILIRUBIN TOTAL: 0.5 mg/dL (ref 0.3–1.2)
BUN: 16 mg/dL (ref 6–20)
CO2: 21 mmol/L — ABNORMAL LOW (ref 22–32)
Calcium: 9.4 mg/dL (ref 8.9–10.3)
Chloride: 105 mmol/L (ref 101–111)
Creatinine, Ser: 0.84 mg/dL (ref 0.61–1.24)
GFR calc Af Amer: >60 mL/min (ref >60)
GFR calc non Af Amer: >60 mL/min (ref >60)
GLUCOSE: 111 mg/dL — AB (ref 65–99)
POTASSIUM: 4.5 mmol/L (ref 3.5–5.1)
Sodium: 138 mmol/L (ref 135–145)
Total Protein: 8.3 g/dL — ABNORMAL HIGH (ref 6.5–8.1)

## 2016-01-11 LAB — URINALYSIS COMPLETE WITH MICROSCOPIC (ARMC ONLY)
BILIRUBIN URINE: NEGATIVE
Glucose, UA: NEGATIVE mg/dL
HGB URINE DIPSTICK: NEGATIVE
KETONES UR: NEGATIVE mg/dL
LEUKOCYTES UA: NEGATIVE
Nitrite: NEGATIVE
PH: 5 (ref 5.0–8.0)
Protein, ur: NEGATIVE mg/dL
RBC / HPF: NONE SEEN RBC/hpf (ref 0–5)
Specific Gravity, Urine: 1.012 (ref 1.005–1.030)
Squamous Epithelial / LPF: NONE SEEN

## 2016-01-11 LAB — CBC
HEMATOCRIT: 45.1 % (ref 40.0–52.0)
Hemoglobin: 15.7 g/dL (ref 13.0–18.0)
MCH: 30.1 pg (ref 26.0–34.0)
MCHC: 34.8 g/dL (ref 32.0–36.0)
MCV: 86.5 fL (ref 80.0–100.0)
PLATELETS: 218 10*3/uL (ref 150–440)
RBC: 5.21 MIL/uL (ref 4.40–5.90)
RDW: 13.9 % (ref 11.5–14.5)
WBC: 8.2 10*3/uL (ref 3.8–10.6)

## 2016-01-11 LAB — ACETAMINOPHEN LEVEL

## 2016-01-11 LAB — SALICYLATE LEVEL: Salicylate Lvl: <4 mg/dL (ref 2.8–30.0)

## 2016-01-11 LAB — ETHANOL: ALCOHOL ETHYL (B): 36 mg/dL — AB (ref <5)

## 2016-01-11 MED ORDER — VITAMIN B-1 100 MG PO TABS
100.0000 mg | ORAL_TABLET | Freq: Every day | ORAL | Status: DC
  Administered 2016-01-11 – 2016-01-12 (×2): 100 mg via ORAL
  Filled 2016-01-11 (×3): qty 1

## 2016-01-11 MED ORDER — LORAZEPAM 2 MG PO TABS
0.0000 mg | ORAL_TABLET | Freq: Two times a day (BID) | ORAL | Status: DC
  Filled 2016-01-11: qty 1

## 2016-01-11 MED ORDER — THIAMINE HCL 100 MG/ML IJ SOLN: 100.0000 mg | Freq: Every day | INTRAMUSCULAR | Status: DC

## 2016-01-11 MED ORDER — LORAZEPAM 2 MG PO TABS
0.0000 mg | ORAL_TABLET | Freq: Four times a day (QID) | ORAL | Status: DC
  Administered 2016-01-11 – 2016-01-12 (×2): 2 mg via ORAL
  Filled 2016-01-11: qty 1

## 2016-01-11 MED ORDER — FLUOXETINE HCL 20 MG PO CAPS
20.0000 mg | ORAL_CAPSULE | Freq: Every day | ORAL | Status: DC
  Administered 2016-01-11 – 2016-01-12 (×2): 20 mg via ORAL
  Filled 2016-01-11 (×3): qty 1

## 2016-01-11 MED ORDER — CLONIDINE HCL 0.1 MG/24HR TD PTWK
0.1000 mg | MEDICATED_PATCH | TRANSDERMAL | Status: DC
  Administered 2016-01-11: 0.1 mg via TRANSDERMAL
  Filled 2016-01-11: qty 1

## 2016-01-11 MED ORDER — LORAZEPAM 2 MG/ML IJ SOLN: 0.0000 mg | Freq: Two times a day (BID) | INTRAMUSCULAR | Status: DC

## 2016-01-11 MED ORDER — LORAZEPAM 2 MG/ML IJ SOLN: 0.0000 mg | Freq: Four times a day (QID) | INTRAMUSCULAR | Status: DC

## 2016-01-11 NOTE — ED Notes (Addendum)

## 2016-01-11 NOTE — ED Notes (Signed)
Patient to ER for c/o SI. Patient states he has had a lot of stress in his life lately. Patient brought by [employer], but here voluntarily.

## 2016-01-11 NOTE — ED Notes (Signed)
Patient resting comfortably in room. No complaints or concerns voiced. No distress or abnormal behavior noted. Will continue to monitor with security cameras. Q 15 minute rounds continue. 

## 2016-01-11 NOTE — ED Provider Notes (Signed)
[facility] Emergency Department Provider Note  ____________________________________________  Time seen: Approximately 4:27 PM  I have reviewed the triage vital signs and the nursing notes.   HISTORY  Chief Complaint Suicidal   HPI Lucas Newton is a 30 y.o. male who reports he has recently ended his rope and wants to kill himself. Both of his brothers have killed himself. He drinks and has had at least 324 ounce beers today. He was drinking last night. He does heroin and cocaine and anything he can get his hands on. He says he has hepatitis C as well. One of his brother shot himself in one of his brothers overdose. Patient himself says he would overdose.   Past Medical History  Diagnosis Date  . Hepatitis C   . Depression   . Suicide attempt (HCC)     There are no active problems to display for this patient.   Past Surgical History  Procedure Laterality Date  . Mandible fracture surgery      "Plate put in"    No current outpatient prescriptions on file.  Allergies Review of patient's allergies indicates no known allergies.  No family history on file.  Social History Social History  Substance Use Topics  . Smoking status: Current Every Day Smoker  . Smokeless tobacco: None  . Alcohol Use: Yes    Review of Systems Constitutional: No fever/chills Eyes: No visual changes. ENT: No sore throat. Cardiovascular: Denies chest pain. Respiratory: Denies shortness of breath. Gastrointestinal: No abdominal pain.  No nausea, no vomiting.  No diarrhea.  No constipation. Genitourinary: Negative for dysuria. Musculoskeletal: Negative for back pain. Skin: Negative for rash. Neurological: Negative for headaches, focal weakness or numbness.  10-point ROS otherwise negative.  ____________________________________________   PHYSICAL EXAM:  VITAL SIGNS: ED Triage Vitals  Enc Vitals Group     BP 01/11/16 1557 145/82 mmHg     Pulse Rate 01/11/16  1557 114     Resp --      Temp 01/11/16 1557 98.1 F (36.7 C)     Temp Source 01/11/16 1557 Oral     SpO2 01/11/16 1557 95 %     Weight 01/11/16 1557 173 lb (78.472 kg)     Height 01/11/16 1557  (1.753 m)     Head Cir --      Peak Flow --      Pain Score --      Pain Loc --      Pain Edu? --      Excl. in GC? --     Constitutional: Alert and oriented. Well appearing and in no acute distress. Eyes: Conjunctivae are normal. PERRL. EOMI. Head: Atraumatic. Nose: No congestion/rhinnorhea. Mouth/Throat: Mucous membranes are moist.  Oropharynx non-erythematous. Neck: No stridor. Cardiovascular: Normal rate, regular rhythm. Grossly normal heart sounds.  Good peripheral circulation. Respiratory: Normal respiratory effort.  No retractions. Lungs CTAB. Gastrointestinal: Soft and nontender. No distention. No abdominal bruits. No CVA tenderness. Musculoskeletal: No lower extremity tenderness nor edema.  No joint effusions. Neurologic:  Normal speech and language. No gross focal neurologic deficits are appreciated. No gait instability. Skin:  Skin is warm, dry and intact. No rash noted.   ____________________________________________   LABS (all labs ordered are listed, but only abnormal results are displayed)  Labs Reviewed  CBC  COMPREHENSIVE METABOLIC PANEL  ETHANOL  SALICYLATE LEVEL  ACETAMINOPHEN LEVEL  URINE DRUG SCREEN, QUALITATIVE ([facility] ONLY)  URINALYSIS COMPLETEWITH MICROSCOPIC ([facility] ONLY)   ____________________________________________  EKG  ____________________________________________  RADIOLOGY   ____________________________________________   PROCEDURES    ____________________________________________   INITIAL IMPRESSION / ASSESSMENT AND PLAN / ED COURSE  Pertinent labs & imaging results that were available during my care of the patient were reviewed by me and considered in my medical decision making (see chart for  details).  ____________________________________________   FINAL CLINICAL IMPRESSION(S) / ED DIAGNOSES  Final diagnoses:  Depressed      Arnaldo Natal, MD 01/11/16 1630

## 2016-01-11 NOTE — ED Notes (Signed)
Pt reports he drinks everyday. Pt also reports he has recently been kicked out of sisters house and has since been having thoughts of hurting self via OD since Saturday. Pt in no acute distress at this time and shows no signs of alcohol withdraws.

## 2016-01-11 NOTE — ED Notes (Signed)
Pt with no complaints of pain at this time, co feeling slightly sweaty but no other symptoms noted.

## 2016-01-11 NOTE — ED Notes (Signed)

## 2016-01-11 NOTE — ED Notes (Signed)
1 on 1 Sitter discontinued.

## 2016-01-11 NOTE — ED Notes (Signed)

## 2016-01-11 NOTE — ED Notes (Signed)
Patient observed with no unusual behavior or acute distress. Patient with no verbalized needs or c/o at this time.... will continue to monitor and follow up as needed. Security staff monitoring patient on [employer] system.  

## 2016-01-11 NOTE — ED Notes (Signed)
IVC/Consult completed/pending placement 

## 2016-01-11 NOTE — Consult Note (Signed)
[facility] Face-to-Face Psychiatry Consult   Reason for Consult:  Consult for this 30 year old man with a history of depression and substance abuse who comes to the hospital saying that he is having suicidal thoughts. Referring Physician:  Darnelle Catalan Patient Identification: Lucas Newton MRN:  [id number] Principal Diagnosis: Substance induced mood disorder [facility]) Diagnosis:   Patient Active Problem List   Diagnosis Date Noted  . Substance induced mood disorder (HCC) [F19.94] 01/11/2016  . Major depression (HCC) [F32.9] 01/11/2016  . Suicidal ideation [R45.851] 01/11/2016  . Cocaine abuse [F14.10] 01/11/2016  . Alcohol abuse [F10.10] 01/11/2016    Total Time spent with patient: 1 hour  Subjective:   Lucas Newton is a 30 y.o. male patient admitted with "I am just tired and I'm at the end of it".  HPI:  Patient interviewed. Chart reviewed. Old notes reviewed. Labs reviewed. 30 year old man with a history of depressive symptoms and substance abuse comes to the emergency room saying that he is finally sick and tired of living and is going to kill himself. He says that last night he "tried to overdose" by taking a whole bunch of pills and drinking a lot of alcohol. After he did that he went back to his sister's house and she threw him out so now he has no place to stay. He said his mood had been okay until Saturday when it abruptly became severely depressed. He started having thoughts about wanting to die. He denies that he is having auditory or visual hallucinations. He does say that he hasn't been sleeping well or eating well for at least several days. He is not getting any outpatient psychiatric treatment at all. Patient states that he's been drinking a lot of alcohol and also using other drugs. Claims that he is snorting heroin as well as smoking marijuana and using cocaine.  Social history: Patient had been staying at his sister's house. 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 Now evidently  his sister has thrown him out after he's been using a lot of drugs. Patient goes back and forth between [place] and [place].  Medical history: Denies any ongoing medical problems outside of the psychiatric.  Substance abuse history: Long history of abuse of alcohol marijuana and multiple other drugs. He indicates that he's had some periods of trying to stay clean for a little while and then will start binging again.  Past Psychiatric History: Patient has had psychiatric admissions here in the past and been diagnosed with depression as well as substance abuse. He says he's been on "every" medication. Last time he was here he was on fluoxetine. Doesn't seem to stay compliant with medication. Most of the suicide attempts he talks about consist of taking too many drugs.  Risk to Self: Is patient at risk for suicide?: Yes Risk to Others:   Prior Inpatient Therapy:   Prior Outpatient Therapy:    Past Medical History:  Past Medical History  Diagnosis Date  . Hepatitis C   . Depression   . Suicide attempt [facility])     Past Surgical History  Procedure Laterality Date  . Mandible fracture surgery      "Plate put in"   Family History: No family history on file. Family Psychiatric  History: Patient states that he had a brother who shot himself and another brother and a father who died of drug-related complications Social History:  History  Alcohol Use  . Yes     History  Drug Use  . Yes  Comment: "Everything"    Social History   Social History  . Marital Status: Single    Spouse Name: N/A  . Number of Children: N/A  . Years of Education: N/A   Social History Main Topics  . Smoking status: Current Every Day Smoker  . Smokeless tobacco: None  . Alcohol Use: Yes  . Drug Use: Yes     Comment: "Everything"  . Sexual Activity: Not Asked   Other Topics Concern  . None   Social History Narrative  . None   Additional Social History:    Allergies:  No Known  Allergies  Labs:  Results for orders placed or performed during the hospital encounter of 01/11/16 (from the past 48 hour(s))  Ethanol (ETOH)     Status: Abnormal   Collection Time: 01/11/16  4:01 PM  Result Value Ref Range   Alcohol, Ethyl (B) 36 (H) <5 mg/dL    Comment:        LOWEST DETECTABLE LIMIT FOR SERUM ALCOHOL IS 5 mg/dL FOR MEDICAL PURPOSES ONLY   Salicylate level     Status: None   Collection Time: 01/11/16  4:01 PM  Result Value Ref Range   Salicylate Lvl <4.0 2.8 - 30.0 mg/dL  Acetaminophen level     Status: Abnormal   Collection Time: 01/11/16  4:01 PM  Result Value Ref Range   Acetaminophen (Tylenol), Serum <10 (L) 10 - 30 ug/mL    Comment:        THERAPEUTIC CONCENTRATIONS VARY SIGNIFICANTLY. A RANGE OF 10-30 ug/mL MAY BE AN EFFECTIVE CONCENTRATION FOR MANY PATIENTS. HOWEVER, SOME ARE BEST TREATED AT CONCENTRATIONS OUTSIDE THIS RANGE. ACETAMINOPHEN CONCENTRATIONS >150 ug/mL AT 4 HOURS AFTER INGESTION AND >50 ug/mL AT 12 HOURS AFTER INGESTION ARE OFTEN ASSOCIATED WITH TOXIC REACTIONS.   CBC     Status: None   Collection Time: 01/11/16  4:01 PM  Result Value Ref Range   WBC 8.2 3.8 - 10.6 K/uL   RBC 5.21 4.40 - 5.90 MIL/uL   Hemoglobin 15.7 13.0 - 18.0 g/dL   HCT 30.8 65.7 - 84.6 %   MCV 86.5 80.0 - 100.0 fL   MCH 30.1 26.0 - 34.0 pg   MCHC 34.8 32.0 - 36.0 g/dL   RDW 96.2 95.2 - 84.1 %   Platelets 218 150 - 440 K/uL  Urine Drug Screen, Qualitative ([facility] only)     Status: Abnormal   Collection Time: 01/11/16  4:01 PM  Result Value Ref Range   Tricyclic, Ur Screen NONE DETECTED NONE DETECTED   Amphetamines, Ur Screen NONE DETECTED NONE DETECTED   MDMA (Ecstasy)Ur Screen NONE DETECTED NONE DETECTED   Cocaine Metabolite,Ur [place] POSITIVE (A) NONE DETECTED   Opiate, Ur Screen NONE DETECTED NONE DETECTED   Phencyclidine (PCP) Ur S NONE DETECTED NONE DETECTED   Cannabinoid 50 Ng, Ur [place] POSITIVE (A) NONE DETECTED   Barbiturates, Ur Screen NONE DETECTED  NONE DETECTED   Benzodiazepine, Ur Scrn POSITIVE (A) NONE DETECTED   Methadone Scn, Ur NONE DETECTED NONE DETECTED    Comment: (NOTE) 100  Tricyclics, urine               Cutoff 1000 ng/mL 200  Amphetamines, urine             Cutoff 1000 ng/mL 300  MDMA (Ecstasy), urine           Cutoff 500 ng/mL 400  Cocaine Metabolite, urine       Cutoff 300 ng/mL 500  Opiate,  urine                   Cutoff 300 ng/mL 600  Phencyclidine (PCP), urine      Cutoff 25 ng/mL 700  Cannabinoid, urine              Cutoff 50 ng/mL 800  Barbiturates, urine             Cutoff 200 ng/mL 900  Benzodiazepine, urine           Cutoff 200 ng/mL 1000 Methadone, urine                Cutoff 300 ng/mL 1100 1200 The urine drug screen provides only a preliminary, unconfirmed 1300 analytical test result and should not be used for non-medical 1400 purposes. Clinical consideration and professional judgment should 1500 be applied to any positive drug screen result due to possible 1600 interfering substances. A more specific alternate chemical method 1700 must be used in order to obtain a confirmed analytical result.  1800 Gas chromato graphy / mass spectrometry (GC/MS) is the preferred 1900 confirmatory method.   Urinalysis complete, with microscopic     Status: Abnormal   Collection Time: 01/11/16  4:01 PM  Result Value Ref Range   Color, Urine YELLOW (A) YELLOW   APPearance CLEAR (A) CLEAR   Glucose, UA NEGATIVE NEGATIVE mg/dL   Bilirubin Urine NEGATIVE NEGATIVE   Ketones, ur NEGATIVE NEGATIVE mg/dL   Specific Gravity, Urine 1.012 1.005 - 1.030   Hgb urine dipstick NEGATIVE NEGATIVE   pH 5.0 5.0 - 8.0   Protein, ur NEGATIVE NEGATIVE mg/dL   Nitrite NEGATIVE NEGATIVE   Leukocytes, UA NEGATIVE NEGATIVE   RBC / HPF NONE SEEN 0 - 5 RBC/hpf   WBC, UA 0-5 0 - 5 WBC/hpf   Bacteria, UA RARE (A) NONE SEEN   Squamous Epithelial / LPF NONE SEEN NONE SEEN   Mucous PRESENT     Current Facility-Administered Medications   Medication Dose Route Frequency Provider Last Rate Last Dose  . FLUoxetine (PROZAC) capsule 20 mg  20 mg Oral Daily John T Clapacs, MD      . LORazepam (ATIVAN) injection 0-4 mg  0-4 mg Intravenous 4 times per day Arnaldo Natal, MD      . LORazepam (ATIVAN) injection 0-4 mg  0-4 mg Intravenous Q12H Arnaldo Natal, MD      . LORazepam (ATIVAN) tablet 0-4 mg  0-4 mg Oral 4 times per day Arnaldo Natal, MD      . LORazepam (ATIVAN) tablet 0-4 mg  0-4 mg Oral Q12H Arnaldo Natal, MD      . thiamine (B-1) injection 100 mg  100 mg Intravenous Daily Arnaldo Natal, MD      . thiamine (VITAMIN B-1) tablet 100 mg  100 mg Oral Daily Arnaldo Natal, MD       No current outpatient prescriptions on file.    Musculoskeletal: Strength & Muscle Tone: within normal limits Gait & Station: normal Patient leans: N/A  Psychiatric Specialty Exam: Review of Systems  Constitutional: Negative.   HENT: Negative.   Eyes: Negative.   Respiratory: Negative.   Cardiovascular: Negative.   Gastrointestinal: Negative.   Musculoskeletal: Negative.   Skin: Negative.   Neurological: Negative.   Psychiatric/Behavioral: Positive for depression, suicidal ideas and substance abuse. Negative for hallucinations and memory loss. The patient is nervous/anxious and has insomnia.     Blood pressure 145/82, pulse 114, temperature 98.1 F (  36.7 C), temperature source Oral, height 5\' 9"  (1.753 m), weight 78.472 kg (173 lb), SpO2 95 %.Body mass index is 25.54 kg/(m^2).  General Appearance: Disheveled  Eye Contact::  Minimal  Speech:  Slow  Volume:  Decreased  Mood:  Dysphoric  Affect:  Depressed  Thought Process:  Linear  Orientation:  Full (Time, Place, and Person)  Thought Content:  Negative  Suicidal Thoughts:  Yes.  with intent/plan  Homicidal Thoughts:  No  Memory:  Immediate;   Good Recent;   Good Remote;   Good  Judgement:  Fair  Insight:  Fair  Psychomotor Activity:  Normal  Concentration:  Fair  Recall:   [employer] of Knowledge:Fair  Language: Fair  Akathisia:  No  Handed:  Right  AIMS (if indicated):     Assets:  Communication Skills Desire for Improvement Physical Health Resilience  ADL's:  Intact  Cognition: WNL  Sleep:      Treatment Plan Summary: Daily contact with patient to assess and evaluate symptoms and progress in treatment, Medication management and Plan A she states that he is suicidal. Drug screen is positive for benzodiazepines and cocaine and cannabis. Not for opiates. He has some alcohol any right now. No acetaminophen. Currently he is not tremulous does not appear to be delirious. Blood pressure is elevated and so his pulse. Patient is on detox protocol. He will be put down for admission to the hospital when we have a bed available. Meanwhile restart fluoxetine for depression. Due continuous observation for suicidality. Detox for substance abuse treatment.  Disposition: Recommend psychiatric Inpatient admission when medically cleared. Supportive therapy provided about ongoing stressors.  Mordecai Rasmussen, MD 01/11/2016 5:17 PM

## 2016-01-11 NOTE — BH Assessment (Signed)
Assessment Note  Lucas Newton is an 30 y.o. male. [name] arrived to the ED by [facility].  He reports that he was having suicidal thoughts.  He states that he got kicked out of his house and the police were called on him and he voluntarily came to the hospital. He states that his money situation was the cause of him being removed from his home.  He states that he came home and his belongings were on the front porch. He was residing with his mother, sister, and brother-in-law. He said that he reported to the police he was suicidal. He reports that he is "always depressed" and cries a lot for no reason.  He denied having auditory or visual hallucinations.  He denied having homicidal ideation or intent.  He expressed feelings of anxiety. He reports that he uses "everything". He states that he uses alcohol, crack cocaine, and is an opiate user.  Diagnosis:   Past Medical History:  Past Medical History  Diagnosis Date  . Hepatitis C   . Depression   . Suicide attempt [facility])     Past Surgical History  Procedure Laterality Date  . Mandible fracture surgery      "Plate put in"    Family History: No family history on file.  Social History:  reports that he has been smoking.  He does not have any smokeless tobacco history on file. He reports that he drinks alcohol. He reports that he uses illicit drugs.  Additional Social History:  Alcohol / Drug Use History of alcohol / drug use?: Yes Substance #1 Name of Substance 1: alcohol 1 - Age of First Use: 13 1 - Amount (size/oz): at least a 12 pack 1 - Frequency: daily 1 - Last Use / Amount: 01/11/2016 - 3 - 24 oz cans of beer Substance #2 Name of Substance 2: Crack Cocaine 2 - Age of First Use: 18 2 - Amount (size/oz): 2 grams 2 - Frequency: 2 times a week 2 - Last Use / Amount: 01/11/2016 - 1 gram Substance #3 Name of Substance 3: Opiates 3 - Age of First Use: 17 3 - Amount (size/oz): 1-3 grams 3 - Frequency: daily 3 -  Last Use / Amount: 01/11/2016 - 2/10 of a gram of heroin + 7 10 mg percocet  CIWA: CIWA-Ar BP: 121/71 mmHg Pulse Rate: 74 Nausea and Vomiting: no nausea and no vomiting Tactile Disturbances: none Tremor: no tremor Auditory Disturbances: not present Paroxysmal Sweats: barely perceptible sweating, palms moist Visual Disturbances: not present Anxiety: no anxiety, at ease Headache, Fullness in Head: none present Agitation: normal activity Orientation and Clouding of Sensorium: oriented and can do serial additions CIWA-Ar Total: 1 COWS:    Allergies: No Known Allergies  Home Medications:  (Not in a hospital admission)  OB/GYN Status:  No LMP for male patient.  General Assessment Data [facility] of [facility] ED TTS Assessment: In system Is this a Tele or Face-to-Face Assessment?: Face-to-Face Is this an Initial Assessment or a Re-assessment for this encounter?: Initial Assessment Marital status: Single Maiden name: n/a Is patient pregnant?: No Pregnancy Status: No Living Arrangements: Other (Comment) (homeless) Can pt return to current living arrangement?: No Admission Status: Voluntary Is patient capable of signing voluntary admission?: Yes Insurance type: medicaid  Medical Screening Exam [facility] Walk-in ONLY) Medical Exam completed: Yes  Crisis Care Plan Living Arrangements: Other (Comment) (homeless) Legal Guardian: Other: (self) Name of Psychiatrist: denied Name of Therapist: denied  Education Status Is patient  currently in school?: No Current Grade: n/a Highest grade of school patient has completed: 11th Name of school: [employer] - in [place] Contact person: n/a  Risk to self with the past 6 months Suicidal Ideation: Yes-Currently Present Has patient been a risk to self within the past 6 months prior to admission? : Yes Suicidal Intent: Yes-Currently Present Has patient had any suicidal intent within the past 6 months prior to admission? : Yes Is  patient at risk for suicide?: Yes Suicidal Plan?: Yes-Currently Present Has patient had any suicidal plan within the past 6 months prior to admission? : Yes Specify Current Suicidal Plan: Overdose Access to Means: No (Not at this time) What has been your use of drugs/alcohol within the last 12 months?: daily use of alcohol, cocaine, and opiates Previous Attempts/Gestures: Yes How many times?: 10 (-states tried to overdose almost daily for a period of time) Other Self Harm Risks: history of cutting and burning- no incidents in over a year Triggers for Past Attempts: Anniversary (holidays, birthdays, death days, and failures) Intentional Self Injurious Behavior: Cutting, Burning Family Suicide History: Yes (2 brothers committed suicide, friends have committed suicide) Recent stressful life event(s): Loss (Comment) (home) Persecutory voices/beliefs?: No Depression: Yes Depression Symptoms: Tearfulness Substance abuse history and/or treatment for substance abuse?: Yes Suicide prevention information given to non-admitted patients: Not applicable  Risk to Others within the past 6 months Homicidal Ideation: No Does patient have any lifetime risk of violence toward others beyond the six months prior to admission? : No Thoughts of Harm to Others: No Current Homicidal Intent: No Current Homicidal Plan: No Access to Homicidal Means: No Identified Victim: Denied History of harm to others?: No Assessment of Violence: None Noted Violent Behavior Description: none reported Does patient have access to weapons?: No Does patient have a court date: No Is patient on probation?: No  Psychosis Hallucinations: None noted Delusions: None noted  Mental Status Report Appearance/Hygiene: In scrubs, Unremarkable Eye Contact: Fair Motor Activity: Unremarkable Speech: Logical/coherent Level of Consciousness: Alert Mood: Worthless, low self-esteem Affect: Sad Anxiety Level: Minimal Thought Processes:  Coherent Judgement: Unimpaired Orientation: Place, Time, Situation Obsessive Compulsive Thoughts/Behaviors: None  Cognitive Functioning Concentration: Normal Memory: Recent Intact IQ: Average Insight: Fair Impulse Control: Fair Appetite: Good Sleep: Decreased Vegetative Symptoms: None  ADLScreening [facility] Assessment Services) Patient's cognitive ability adequate to safely complete daily activities?: Yes Patient able to express need for assistance with ADLs?: Yes Independently performs ADLs?: Yes (appropriate for developmental age)  Prior Inpatient Therapy Prior Inpatient Therapy: Yes Prior Therapy Dates: 2015 Prior Therapy Facilty/Provider(s): [facility] [place] Reason for Treatment: Depression  Prior Outpatient Therapy Prior Outpatient Therapy: No Prior Therapy Dates: n/a Prior Therapy Facilty/Provider(s): n/a Reason for Treatment: n/a Does patient have an ACCT team?: No Does patient have Intensive In-House Services?  : No Does patient have [facility] services? : No Does patient have P4CC services?: No  ADL Screening (condition at time of admission) Patient's cognitive ability adequate to safely complete daily activities?: Yes Patient able to express need for assistance with ADLs?: Yes Independently performs ADLs?: Yes (appropriate for developmental age)       Abuse/Neglect Assessment (Assessment to be complete while patient is alone) Physical Abuse: Denies Verbal Abuse: Denies Sexual Abuse: Denies Exploitation of patient/patient's resources: Denies Self-Neglect: Denies Values / Beliefs Cultural Requests During Hospitalization: None Spiritual Requests During Hospitalization: None   Advance Directives (For Healthcare) Does patient have an advance directive?: No    Additional Information  1:1 In Past 12 Months?: No CIRT Risk: No Elopement Risk: No Does patient have medical clearance?: Yes     Disposition:   Disposition Initial Assessment Completed for this Encounter: Yes Disposition of Patient: Other dispositions  On Site Evaluation by:   Reviewed with Physician:    Justice Deeds 01/11/2016 9:14 PM

## 2016-01-11 NOTE — ED Notes (Signed)
Report received from [facility]. Patient care assumed. Patient/RN introduction complete. Will continue to monitor.  Pt moved to [facility] 2 per md order.  TTS in progress at this time.

## 2016-01-12 ENCOUNTER — Inpatient Hospital Stay
Admission: EM | Admit: 2016-01-12 | Discharge: 2016-01-14 | DRG: 897 | Disposition: A | Discharge status: Home / Self Care | Payer: No Typology Code available for payment source | Source: Intra-hospital | Attending: Psychiatry | Admitting: Psychiatry

## 2016-01-12 ENCOUNTER — Encounter: Payer: Self-pay | Admitting: Psychiatry

## 2016-01-12 DIAGNOSIS — G4700 Insomnia, unspecified: Secondary | ICD-10-CM | POA: Diagnosis present

## 2016-01-12 DIAGNOSIS — F1010 Alcohol abuse, uncomplicated: Secondary | ICD-10-CM | POA: Diagnosis present

## 2016-01-12 DIAGNOSIS — F329 Major depressive disorder, single episode, unspecified: Secondary | ICD-10-CM | POA: Diagnosis not present

## 2016-01-12 DIAGNOSIS — F1420 Cocaine dependence, uncomplicated: Secondary | ICD-10-CM | POA: Diagnosis present

## 2016-01-12 DIAGNOSIS — F10939 Alcohol use, unspecified with withdrawal, unspecified: Secondary | ICD-10-CM

## 2016-01-12 DIAGNOSIS — F1120 Opioid dependence, uncomplicated: Secondary | ICD-10-CM

## 2016-01-12 DIAGNOSIS — F10239 Alcohol dependence with withdrawal, unspecified: Secondary | ICD-10-CM

## 2016-01-12 DIAGNOSIS — F3289 Other specified depressive episodes: Secondary | ICD-10-CM

## 2016-01-12 DIAGNOSIS — F1220 Cannabis dependence, uncomplicated: Secondary | ICD-10-CM | POA: Diagnosis present

## 2016-01-12 DIAGNOSIS — B1920 Unspecified viral hepatitis C without hepatic coma: Secondary | ICD-10-CM | POA: Diagnosis present

## 2016-01-12 DIAGNOSIS — F4310 Post-traumatic stress disorder, unspecified: Secondary | ICD-10-CM | POA: Diagnosis present

## 2016-01-12 DIAGNOSIS — Z590 Homelessness: Secondary | ICD-10-CM | POA: Diagnosis not present

## 2016-01-12 DIAGNOSIS — Z915 Personal history of self-harm: Secondary | ICD-10-CM

## 2016-01-12 DIAGNOSIS — F17210 Nicotine dependence, cigarettes, uncomplicated: Secondary | ICD-10-CM | POA: Diagnosis present

## 2016-01-12 DIAGNOSIS — F1114 Opioid abuse with opioid-induced mood disorder: Secondary | ICD-10-CM | POA: Diagnosis present

## 2016-01-12 DIAGNOSIS — F1124 Opioid dependence with opioid-induced mood disorder: Secondary | ICD-10-CM

## 2016-01-12 DIAGNOSIS — F4321 Adjustment disorder with depressed mood: Secondary | ICD-10-CM

## 2016-01-12 DIAGNOSIS — R740 Nonspecific elevation of levels of transaminase and lactic acid dehydrogenase [LDH]: Secondary | ICD-10-CM

## 2016-01-12 DIAGNOSIS — F1924 Other psychoactive substance dependence with psychoactive substance-induced mood disorder: Secondary | ICD-10-CM | POA: Diagnosis present

## 2016-01-12 DIAGNOSIS — F1123 Opioid dependence with withdrawal: Secondary | ICD-10-CM | POA: Diagnosis not present

## 2016-01-12 DIAGNOSIS — F17200 Nicotine dependence, unspecified, uncomplicated: Secondary | ICD-10-CM

## 2016-01-12 DIAGNOSIS — F1590 Other stimulant use, unspecified, uncomplicated: Secondary | ICD-10-CM

## 2016-01-12 DIAGNOSIS — F1020 Alcohol dependence, uncomplicated: Secondary | ICD-10-CM

## 2016-01-12 DIAGNOSIS — R7401 Elevation of levels of liver transaminase levels: Secondary | ICD-10-CM

## 2016-01-12 MED ORDER — THIAMINE HCL 100 MG/ML IJ SOLN: 100.0000 mg | Freq: Every day | INTRAMUSCULAR | Status: DC

## 2016-01-12 MED ORDER — ONDANSETRON HCL 4 MG PO TABS
4.0000 mg | ORAL_TABLET | Freq: Three times a day (TID) | ORAL | Status: DC | PRN
  Administered 2016-01-13 – 2016-01-14 (×3): 4 mg via ORAL
  Filled 2016-01-12 (×3): qty 1

## 2016-01-12 MED ORDER — LOPERAMIDE HCL 2 MG PO CAPS: 2.0000 mg | ORAL_CAPSULE | ORAL | Status: DC | PRN

## 2016-01-12 MED ORDER — VITAMIN B-1 100 MG PO TABS: 100.0000 mg | ORAL_TABLET | Freq: Every day | ORAL | Status: DC

## 2016-01-12 MED ORDER — IBUPROFEN 800 MG PO TABS
800.0000 mg | ORAL_TABLET | Freq: Three times a day (TID) | ORAL | Status: DC | PRN
  Administered 2016-01-13: 800 mg via ORAL
  Filled 2016-01-12: qty 1

## 2016-01-12 MED ORDER — ACETAMINOPHEN 325 MG PO TABS: 650.0000 mg | ORAL_TABLET | Freq: Four times a day (QID) | ORAL | Status: DC | PRN

## 2016-01-12 MED ORDER — ALUM & MAG HYDROXIDE-SIMETH 200-200-20 MG/5ML PO SUSP
30.0000 mL | ORAL | Status: DC | PRN
Start: 2016-01-12 — End: 2016-01-14

## 2016-01-12 MED ORDER — LORAZEPAM 2 MG PO TABS
0.0000 mg | ORAL_TABLET | Freq: Four times a day (QID) | ORAL | Status: DC
  Administered 2016-01-12: 2 mg via ORAL
  Filled 2016-01-12: qty 1

## 2016-01-12 MED ORDER — CHLORDIAZEPOXIDE HCL 25 MG PO CAPS
25.0000 mg | ORAL_CAPSULE | Freq: Three times a day (TID) | ORAL | Status: DC
  Administered 2016-01-12 – 2016-01-14 (×6): 25 mg via ORAL
  Filled 2016-01-12 (×7): qty 1

## 2016-01-12 MED ORDER — TRAZODONE HCL 100 MG PO TABS
100.0000 mg | ORAL_TABLET | Freq: Every evening | ORAL | Status: DC | PRN
  Administered 2016-01-12 – 2016-01-13 (×2): 100 mg via ORAL
  Filled 2016-01-12 (×2): qty 1

## 2016-01-12 MED ORDER — LORAZEPAM 2 MG PO TABS: 0.0000 mg | ORAL_TABLET | Freq: Two times a day (BID) | ORAL | Status: DC

## 2016-01-12 MED ORDER — CHLORDIAZEPOXIDE HCL 25 MG PO CAPS: 50.0000 mg | ORAL_CAPSULE | Freq: Three times a day (TID) | ORAL | Status: DC

## 2016-01-12 MED ORDER — LORAZEPAM 2 MG/ML IJ SOLN: 0.0000 mg | Freq: Two times a day (BID) | INTRAMUSCULAR | Status: DC

## 2016-01-12 MED ORDER — INFLUENZA VAC SPLIT QUAD 0.5 ML IM SUSY
0.5000 mL | PREFILLED_SYRINGE | INTRAMUSCULAR | Status: AC
  Administered 2016-01-14: 0.5 mL via INTRAMUSCULAR
  Filled 2016-01-12 (×2): qty 0.5

## 2016-01-12 MED ORDER — NICOTINE 21 MG/24HR TD PT24
21.0000 mg | MEDICATED_PATCH | Freq: Every day | TRANSDERMAL | Status: DC
  Administered 2016-01-12 – 2016-01-14 (×3): 21 mg via TRANSDERMAL
  Filled 2016-01-12 (×3): qty 1

## 2016-01-12 MED ORDER — LORAZEPAM 2 MG/ML IJ SOLN: 0.0000 mg | Freq: Four times a day (QID) | INTRAMUSCULAR | Status: DC

## 2016-01-12 MED ORDER — FLUOXETINE HCL 20 MG PO CAPS: 20.0000 mg | ORAL_CAPSULE | Freq: Every day | ORAL | Status: DC

## 2016-01-12 MED ORDER — CYCLOBENZAPRINE HCL 10 MG PO TABS
10.0000 mg | ORAL_TABLET | Freq: Three times a day (TID) | ORAL | Status: DC | PRN
  Administered 2016-01-13: 10 mg via ORAL
  Filled 2016-01-12: qty 1

## 2016-01-12 MED ORDER — MAGNESIUM HYDROXIDE 400 MG/5ML PO SUSP
30.0000 mL | Freq: Every day | ORAL | Status: DC | PRN
Start: 2016-01-12 — End: 2016-01-14

## 2016-01-12 NOTE — ED Notes (Signed)

## 2016-01-12 NOTE — Progress Notes (Signed)
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   Received on unit from ED at 1400 in scrubs.  Patient affect sad and blunted.  Denies SI.  Rates depression as a 5.   Body search and skin assessment performed.  No contraband found.  Skin warm and dry to touch.  No broken areas noted.

## 2016-01-12 NOTE — ED Notes (Signed)
ENVIRONMENTAL ASSESSMENT Potentially harmful objects out of patient reach: Yes Personal belongings secured: Yes Patient dressed in hospital provided attire only: Yes Plastic bags out of patient reach: Yes Patient care equipment (cords, cables, call bells, lines, and drains) shortened, removed, or accounted for: Yes Equipment and supplies removed from bottom of stretcher: Yes Potentially toxic materials out of patient reach: Yes Sharps container removed or out of patient reach: Yes  Patient currently in room sleeping. No signs of distress noted. Patient received breakfast tray. Maintained on 15 minute checks and observation by security camera for safety.  

## 2016-01-12 NOTE — ED Notes (Signed)
Patient resting quietly in room. No noted distress or abnormal behaviors noted. Will continue 15 minute checks and observation by security camera for safety. 

## 2016-01-12 NOTE — BH Assessment (Signed)
Patient is to be admitted to [facility] by Dr. Toni Amend.  Attending Physician will be Dr. Ardyth Harps.   Patient has been assigned to room 306-A, by [facility] Charge Nurse [place].   Intake Paper Work has been signed and placed on patient chart.  ER staff is aware of the admission Rivka Barbara, ER Sect.; Dr. Lenard Lance, ER MD; Kasandra Knudsen, Patient's Nurse & Byrd Hesselbach Patient Access).

## 2016-01-12 NOTE — ED Provider Notes (Signed)
-----------------------------------------   8:01 AM on 01/12/2016 -----------------------------------------   Blood pressure 121/71, pulse 74, temperature 97.9 F (36.6 C), temperature source Oral, resp. rate 18, height  (1.753 m), weight 173 lb (78.472 kg), SpO2 97 %.  The patient had no acute events since last update.  The patient has been seen and evaluated by psychiatry. They believe the patient will require inpatient admission given his polysubstance abuse, depression and suicidality. Patient has been started on fluoxetine, and will be admitted to the psychiatric unit once a bed becomes available. Patient's labs in the emergency Department show elevated liver function tests along with a slightly elevated alcohol level, and the urine toxicology screen positive for cocaine, cannabinoids, benzodiazepines.  Minna Antis, MD 01/12/16 [phone]

## 2016-01-12 NOTE — ED Notes (Signed)
Denying SI/HI/AVH. Admission to inpatient unit discussed. Patient agrees with plan. All belongings sent with patient.

## 2016-01-12 NOTE — ED Notes (Signed)
Patient asleep in room. No noted distress or abnormal behavior. Will continue 15 minute checks and observation by security cameras for safety. 

## 2016-01-12 NOTE — ED Notes (Addendum)

## 2016-01-12 NOTE — ED Provider Notes (Signed)
-----------------------------------------   1:21 PM on 01/12/2016 -----------------------------------------  Patient has been seen and evaluated by psychiatry. They believe the patient is in need for further treatment and will be admitting to their service.  Minna Antis, MD 01/12/16 1323

## 2016-01-12 NOTE — ED Notes (Signed)
Patient resting comfortably in room. No complaints or concerns voiced. No distress or abnormal behavior noted. Will continue to monitor with security cameras. Q 15 minute rounds continue. 

## 2016-01-12 NOTE — ED Notes (Signed)
Patient remains resting in room. Took all scheduled medication. Shows no signs of distress at this time. Maintained on 15 minute checks and observation by security camera for safety.

## 2016-01-12 NOTE — H&P (Addendum)
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 Opioid use disorder, severe, dependence ([place]) [F11.20] 01/12/2016  . Opioid use with withdrawal ([place]) [F11.93] 01/12/2016  . Alcohol use disorder, moderate, dependence ([place]) [F10.20] 01/12/2016  . Tobacco use disorder [F17.200] 01/12/2016  . Hepatitis C [B19.20] 01/12/2016  . Stimulant use disorder (HCC)(cocaine) [F15.90] 01/12/2016  . Alcohol withdrawal ([place]) [F10.239] 01/12/2016  . 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 He says that on Sunday he "tried to overdose" by taking a whole bunch of pills and drinking a lot of alcohol. After he did that he went back to his sister's house and she threw him out so now he has no place to stay. He said his mood had been okay until Saturday when it abruptly became severely depressed. He started having thoughts about wanting to die. He denies that he is having auditory or visual hallucinations. He does say that he hasn't been sleeping well or eating well for at least several days. He is not getting any outpatient psychiatric treatment at all. Patient states that he's been drinking a lot of alcohol and also using other drugs. Claims that he is snorting heroin as well as smoking marijuana and using cocaine.   Urine toxicology was positive for benzodiazepines, cocaine and cannabis. Alcohol level was  36. He has a history of hepatitis C and his AST was 304 in his ALT is 637.  Today the patient reports that his sister kicked him out of the house because he wasn't staying out all night using drugs. He reports that she had some of his money in her wallet. He took this morning from her wallet and asked the sister found out she called the police. He is claiming he was suicidal and therefore the police brought him here. He says that he was thinking about overdosing or walking in the highway. Today he denies SI, HI or auditory or visual hallucinations but reports that he gets discharged something bad is going to happen.  Substance abuse history: Long history of abuse of alcohol marijuana and multiple other drugs. He indicates that he's had some periods of trying to stay clean for a little while and then will start binging again.  Patient says he drinks a 12 pack of beers a day. He is states that this or 24-onz cans, patient says he uses heroin daily. He is states he used crack but his last use was about one year ago. He says he takes pills that he buys on the street as many as he can get. He smokes about one pack of cigarettes per day.   Denies any access to guns.  Associated Signs/Symptoms: Depression Symptoms:  depressed mood, insomnia, suicidal attempt, (Hypo) Manic Symptoms:  denies Anxiety Symptoms:  denies Psychotic Symptoms:  denies PTSD Symptoms: Had a traumatic exposure:  found his brother dead after he had commited suicide   Total Time spent with patient: 1 hour  Past Psychiatric History: Multiple admissions  to our unit. He has been in our unit about 5 times before. His last hospitalization in our unit was in 2012/02/05. In the past and been diagnosed with depression as well as substance abuse. He says he's been on "every" medication. Last time he was here he was on fluoxetine. Doesn't seem to stay compliant with medication. Most of the suicide attempts he talks about consist of taking too  many drugs.  Patient states he has been admitted at [facility] or for substance abuse.   Past Medical History:  Suffers from hepatitis C Past Medical History  Diagnosis Date  . Hepatitis C   . Depression   . Suicide attempt [facility])     Past Surgical History  Procedure Laterality Date  . Mandible fracture surgery      "Plate put in"   Family History: History reviewed. No pertinent family history.  Family Psychiatric  History: Patient reports that his father had an issue with opiate addiction. His father passed away in 02-04-2010. Patient has 2 brothers that committed suicide one overdose of medication and the other one shot himself.  Social History:  Patient had been staying at his sister's house. 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 Now evidently his sister has thrown him out after he's been using a lot of drugs. Patient goes back and forth between [place] and [place]. Denies any legal history History  Alcohol Use  . Yes     History  Drug Use  . Yes    Comment: "Everything"     Allergies:  No Known Allergies   Lab Results:  Results for orders placed or performed during the hospital encounter of 01/11/16 (from the past 48 hour(s))  Comprehensive metabolic panel     Status: Abnormal   Collection Time: 01/11/16  4:01 PM  Result Value Ref Range   Sodium 138 135 - 145 mmol/L   Potassium 4.5 3.5 - 5.1 mmol/L   Chloride 105 101 - 111 mmol/L   CO2 21 (L) 22 - 32 mmol/L   Glucose, Bld 111 (H) 65 - 99 mg/dL   BUN 16 6 - 20 mg/dL   Creatinine, Ser 0.84 0.61 - 1.24 mg/dL   Calcium 9.4 8.9 - 10.3 mg/dL   Total Protein 8.3 (H) 6.5 - 8.1 g/dL   Albumin 4.7 3.5 - 5.0 g/dL   AST 304 (H) 15 - 41 U/L   ALT 637 (H) 17 - 63 U/L   Alkaline Phosphatase 108 38 - 126 U/L   Total Bilirubin 0.5 0.3 - 1.2 mg/dL   GFR calc non Af Amer >60 >60 mL/min   GFR calc Af Amer >60 >60 mL/min    Comment: (NOTE) The eGFR has been calculated using the CKD EPI equation. This calculation has  not been validated in all clinical situations. eGFR's persistently <60 mL/min signify possible Chronic Kidney Disease.    Anion gap 12 5 - 15  Ethanol (ETOH)     Status: Abnormal   Collection Time: 01/11/16  4:01 PM  Result Value Ref Range   Alcohol, Ethyl (B) 36 (H) <5 mg/dL    Comment:        LOWEST DETECTABLE LIMIT FOR SERUM ALCOHOL IS 5 mg/dL FOR MEDICAL PURPOSES ONLY   Salicylate level     Status: None   Collection Time: 01/11/16  4:01 PM  Result Value Ref Range   Salicylate Lvl <9.4 2.8 - 30.0 mg/dL  Acetaminophen level     Status: Abnormal  Collection Time: 01/11/16  4:01 PM  Result Value Ref Range   Acetaminophen (Tylenol), Serum <10 (L) 10 - 30 ug/mL    Comment:        THERAPEUTIC CONCENTRATIONS VARY SIGNIFICANTLY. A RANGE OF 10-30 ug/mL MAY BE AN EFFECTIVE CONCENTRATION FOR MANY PATIENTS. HOWEVER, SOME ARE BEST TREATED AT CONCENTRATIONS OUTSIDE THIS RANGE. ACETAMINOPHEN CONCENTRATIONS >150 ug/mL AT 4 HOURS AFTER INGESTION AND >50 ug/mL AT 12 HOURS AFTER INGESTION ARE OFTEN ASSOCIATED WITH TOXIC REACTIONS.   CBC     Status: None   Collection Time: 01/11/16  4:01 PM  Result Value Ref Range   WBC 8.2 3.8 - 10.6 K/uL   RBC 5.21 4.40 - 5.90 MIL/uL   Hemoglobin 15.7 13.0 - 18.0 g/dL   HCT 45.1 40.0 - 52.0 %   MCV 86.5 80.0 - 100.0 fL   MCH 30.1 26.0 - 34.0 pg   MCHC 34.8 32.0 - 36.0 g/dL   RDW 13.9 11.5 - 14.5 %   Platelets 218 150 - 440 K/uL  Urine Drug Screen, Qualitative ([facility] only)     Status: Abnormal   Collection Time: 01/11/16  4:01 PM  Result Value Ref Range   Tricyclic, Ur Screen NONE DETECTED NONE DETECTED   Amphetamines, Ur Screen NONE DETECTED NONE DETECTED   MDMA (Ecstasy)Ur Screen NONE DETECTED NONE DETECTED   Cocaine Metabolite,Ur [place] POSITIVE (A) NONE DETECTED   Opiate, Ur Screen NONE DETECTED NONE DETECTED   Phencyclidine (PCP) Ur S NONE DETECTED NONE DETECTED   Cannabinoid 50 Ng, Ur [place] POSITIVE (A) NONE DETECTED   Barbiturates, Ur  Screen NONE DETECTED NONE DETECTED   Benzodiazepine, Ur Scrn POSITIVE (A) NONE DETECTED   Methadone Scn, Ur NONE DETECTED NONE DETECTED    Comment: (NOTE) 960  Tricyclics, urine               Cutoff 1000 ng/mL 200  Amphetamines, urine             Cutoff 1000 ng/mL 300  MDMA (Ecstasy), urine           Cutoff 500 ng/mL 400  Cocaine Metabolite, urine       Cutoff 300 ng/mL 500  Opiate, urine                   Cutoff 300 ng/mL 600  Phencyclidine (PCP), urine      Cutoff 25 ng/mL 700  Cannabinoid, urine              Cutoff 50 ng/mL 800  Barbiturates, urine             Cutoff 200 ng/mL 900  Benzodiazepine, urine           Cutoff 200 ng/mL 1000 Methadone, urine                Cutoff 300 ng/mL 1100 1200 The urine drug screen provides only a preliminary, unconfirmed 1300 analytical test result and should not be used for non-medical 1400 purposes. Clinical consideration and professional judgment should 1500 be applied to any positive drug screen result due to possible 1600 interfering substances. A more specific alternate chemical method 1700 must be used in order to obtain a confirmed analytical result.  1800 Gas chromato graphy / mass spectrometry (GC/MS) is the preferred 1900 confirmatory method.   Urinalysis complete, with microscopic     Status: Abnormal   Collection Time: 01/11/16  4:01 PM  Result Value Ref Range   Color, Urine YELLOW (A) YELLOW  APPearance CLEAR (A) CLEAR   Glucose, UA NEGATIVE NEGATIVE mg/dL   Bilirubin Urine NEGATIVE NEGATIVE   Ketones, ur NEGATIVE NEGATIVE mg/dL   Specific Gravity, Urine 1.012 1.005 - 1.030   Hgb urine dipstick NEGATIVE NEGATIVE   pH 5.0 5.0 - 8.0   Protein, ur NEGATIVE NEGATIVE mg/dL   Nitrite NEGATIVE NEGATIVE   Leukocytes, UA NEGATIVE NEGATIVE   RBC / HPF NONE SEEN 0 - 5 RBC/hpf   WBC, UA 0-5 0 - 5 WBC/hpf   Bacteria, UA RARE (A) NONE SEEN   Squamous Epithelial / LPF NONE SEEN NONE SEEN   Mucous PRESENT     Metabolic Disorder Labs:   No results found for: HGBA1C, MPG No results found for: PROLACTIN No results found for: CHOL, TRIG, HDL, CHOLHDL, VLDL, LDLCALC  Current Medications: Current Facility-Administered Medications  Medication Dose Route Frequency Provider Last Rate Last Dose  . acetaminophen (TYLENOL) tablet 650 mg  650 mg Oral Q6H PRN Gonzella Lex, MD      . alum & mag hydroxide-simeth (MAALOX/MYLANTA) 200-200-20 MG/5ML suspension 30 mL  30 mL Oral Q4H PRN Gonzella Lex, MD      . chlordiazePOXIDE (LIBRIUM) capsule 25 mg  25 mg Oral TID Hildred Priest, MD      . cyclobenzaprine (FLEXERIL) tablet 10 mg  10 mg Oral TID PRN Hildred Priest, MD      . ibuprofen (ADVIL,MOTRIN) tablet 800 mg  800 mg Oral TID PRN Hildred Priest, MD      . Derrill Memo ON 01/13/2016] Influenza vac split quadrivalent PF (FLUARIX) injection 0.5 mL  0.5 mL Intramuscular Tomorrow-1000 Hildred Priest, MD      . loperamide (IMODIUM) capsule 2 mg  2 mg Oral PRN Hildred Priest, MD      . magnesium hydroxide (MILK OF MAGNESIA) suspension 30 mL  30 mL Oral Daily PRN Gonzella Lex, MD      . nicotine (NICODERM CQ - dosed in mg/24 hours) patch 21 mg  21 mg Transdermal Daily Hildred Priest, MD      . ondansetron [facility]) tablet 4 mg  4 mg Oral Q8H PRN Hildred Priest, MD      . Derrill Memo ON 01/13/2016] thiamine (VITAMIN B-1) tablet 100 mg  100 mg Oral Daily Gonzella Lex, MD      . traZODone (DESYREL) tablet 100 mg  100 mg Oral QHS PRN Hildred Priest, MD       PTA Medications: No prescriptions prior to admission    Musculoskeletal: Strength & Muscle Tone: within normal limits Gait & Station: normal Patient leans: N/A  Psychiatric Specialty Exam: Physical Exam  Constitutional: He is oriented to person, place, and time. He appears well-developed and well-nourished.  HENT:  Head: Normocephalic and atraumatic.  Eyes: Conjunctivae and EOM are normal.  Neck: Normal  range of motion. Neck supple.  Respiratory: Effort normal.  Musculoskeletal: Normal range of motion.  Neurological: He is alert and oriented to person, place, and time.    Review of Systems  HENT: Negative.   Eyes: Negative.   Respiratory: Negative.   Cardiovascular: Negative.   Gastrointestinal: Negative.   Genitourinary: Negative.   Musculoskeletal: Positive for myalgias.  Skin: Negative.   Neurological: Positive for weakness.  Endo/Heme/Allergies: Negative.   Psychiatric/Behavioral: Positive for depression, suicidal ideas and substance abuse. The patient is nervous/anxious and has insomnia.     Blood pressure 122/65, pulse 64, temperature 98.4 F (36.9 C), temperature source Oral, resp. rate 16, height 5' 9"  (1.753 m),  weight 73.483 kg (162 lb), SpO2 99 %.Body mass index is 23.91 kg/(m^2).  General Appearance: Fairly Groomed  Engineer, water::  Good  Speech:  Normal Rate  Volume:  Normal  Mood:  Dysphoric  Affect:  Blunt  Thought Process:  Linear and Logical  Orientation:  Full (Time, Place, and Person)  Thought Content:  Hallucinations: None  Suicidal Thoughts:  No  Homicidal Thoughts:  No  Memory:  Immediate;   Good Recent;   Good Remote;   Good  Judgement:  Poor  Insight:  Shallow  Psychomotor Activity:  Decreased  Concentration:  Fair  Recall:  Good  Fund of Knowledge:Good  Language: Good  Akathisia:  No  Handed:    AIMS (if indicated):     Assets:  Communication Skills  ADL's:  Intact  Cognition: WNL  Sleep:        Treatment Plan Summary: Daily contact with patient to assess and evaluate symptoms and progress in treatment and Medication management   31 year old with hepatitis C, opioid, alcohol, cannabis and cocaine dependence who presents to our emergency department after an overdose. Alcohol level was 36. Urine toxicology screen positive for cocaine, benzodiazepines and cannabis.  Opiate-induced depressive disorder: At this point in time we will hold off  from starting antidepressants as most likely issues are secondary to loose social stressors, patient is currently homeless and withdrawals.  Opiate withdrawal: He will be treated symptomatically. I will order Flexeril 10 mg 3 times a day, Imodium 3 times a day when necessary, ibuprofen 800 mg 3 times a day when necessary, Zofran 4 mg 3 times a day when necessary.  Alcohol withdrawal: Currently no objective evidence of withdrawal spell signs are within the normal limits and his heart rate is 64. CIWA score is 12--- likely elevated due to opiate withdrawal. Patient has been started on Librium 25 mg by mouth 3 times a day.  Opiate dependence, cocaine dependence, cannabis dependence, alcohol dependence: Consider referral to inpatient substance abuse once stable  Tobacco use disorder: We'll order nicotine patch 21 mg a day  Hepatitis C: Elevated AST and ALT. I will order an ultrasound of the abdomen for tomorrow morning.  I will order liver function tests tomorrow.  Labs: LFTs in a.m.  Imaging: Abdominal ultrasound in the morning  Precautions every 15 minute checks along with withdrawal precautions  Disposition: To be determined, will recommend inpatient substance abuse once stable.  >90 minutes.  Records from prior admissions were reviewed. Interview with the patient was completed. Review of laboratory results from present admission and prior encounters have been reviewed.  I certify that inpatient services furnished can reasonably be expected to improve the patient's condition.    Hildred Priest, MD 2/7/20174:49 PM

## 2016-01-12 NOTE — ED Notes (Signed)
Patient currently denies SI/HI/and AVH. Patient states that he has a slight headache related to alcohol withdrawal symptoms and scored a CIWA of 12. When asked about why he was brought to the hospital he simply said , "Life is just a lot right now" but didn't further any more information. Patient is pleasant but guarded. Given  ativan per CIWA protocol, will continue to monitor for further symptoms. Maintained on 15 minute checks and observation by security camera for safety.

## 2016-01-12 NOTE — [facility] Suicide Risk Assessment (Signed)
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 Opioid use disorder, severe, dependence (HCC) [F11.20] 01/12/2016  . Opioid use with withdrawal (HCC) [F11.93] 01/12/2016  . Alcohol use disorder, moderate, dependence (HCC) [F10.20] 01/12/2016  . Tobacco use disorder [F17.200] 01/12/2016  . Hepatitis C [B19.20] 01/12/2016  . Stimulant use disorder (HCC)(cocaine) [F15.90] 01/12/2016  . Alcohol withdrawal (HCC) [F10.239] 01/12/2016  . 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 Substance induced mood disorder [facility]) [F19.94] 01/11/2016   Subjective Data:   Continued Clinical Symptoms:  Alcohol Use Disorder Identification Test Final Score (AUDIT): 30 The "Alcohol Use Disorders Identification Test", Guidelines for Use in Primary Care, Second Edition.  World Science writer [facility]). Score between 0-7:  no or low risk or alcohol related problems. Score between 8-15:  moderate risk of alcohol related problems. Score between 16-19:  high risk of alcohol related problems. Score 20 or above:  warrants further diagnostic evaluation for alcohol dependence and treatment.   CLINICAL FACTORS:   Alcohol/Substance Abuse/Dependencies Previous Psychiatric Diagnoses and Treatments    Psychiatric Specialty Exam: ROS  Blood pressure 122/65, pulse 64, temperature 98.4 F (36.9 C), temperature source Oral, resp. rate 16, height  (1.753 m), weight 73.483 kg (162 lb), SpO2 99 %.Body mass index is 23.91 kg/(m^2).                                                         COGNITIVE FEATURES THAT CONTRIBUTE TO RISK:  None    SUICIDE RISK:   Moderate:  Frequent suicidal ideation with limited intensity, and duration, some specificity in terms of plans, no associated intent, good self-control, limited dysphoria/symptomatology, some risk factors present, and identifiable protective factors, including available and accessible social support.  PLAN OF CARE: admit to [facility]  I certify that inpatient services furnished can reasonably be expected to improve the patient's condition.   Jimmy Footman, MWeimar Medical CenterD 01/12/2016, 4:37 PM

## 2016-01-12 NOTE — Tx Team (Signed)
Initial Interdisciplinary Treatment Plan   PATIENT STRESSORS: Financial difficulties Marital or family conflict Substance abuse   PATIENT STRENGTHS: Average or above average intelligence Capable of independent living Communication skills Motivation for treatment/growth   PROBLEM LIST: Problem List/Patient Goals Date to be addressed Date deferred Reason deferred Estimated date of resolution  "get through withdrawals"      homeless      Substance abuse                                           DISCHARGE CRITERIA:  Improved stabilization in mood, thinking, and/or behavior Motivation to continue treatment in a less acute level of care  PRELIMINARY DISCHARGE PLAN: Outpatient therapy  PATIENT/FAMIILY INVOLVEMENT: This treatment plan has been presented to and reviewed with the patient, Lucas Newton, and/or family member, .  The patient and family have been given the opportunity to ask questions and make suggestions.  [name] 01/12/2016, 6:02 PM

## 2016-01-12 NOTE — [facility] Group Notes (Signed)
[facility] Group Notes:  (Nursing/MHT/Case Management/Adjunct)  Date:  01/12/2016  Time:  10:15 PM  Type of Therapy:  Evening Wrap-up Group  Participation Level:  Minimal  Participation Quality:  Appropriate  Affect:  Appropriate  Cognitive:  Appropriate  Insight:  Improving  Engagement in Group:  Developing/Improving  Modes of Intervention:  Discussion  Summary of Progress/Problems:  Tomasita Morrow 01/12/2016, 10:15 PM

## 2016-01-13 ENCOUNTER — Inpatient Hospital Stay: Payer: No Typology Code available for payment source

## 2016-01-13 LAB — HEPATIC FUNCTION PANEL
ALT: 456 U/L — ABNORMAL HIGH (ref 17–63)
AST: 192 U/L — ABNORMAL HIGH (ref 15–41)
Albumin: 4.2 g/dL (ref 3.5–5.0)
Alkaline Phosphatase: 106 U/L (ref 38–126)
BILIRUBIN DIRECT: 0.1 mg/dL (ref 0.1–0.5)
BILIRUBIN INDIRECT: 0.4 mg/dL (ref 0.3–0.9)
Total Bilirubin: 0.5 mg/dL (ref 0.3–1.2)
Total Protein: 7.6 g/dL (ref 6.5–8.1)

## 2016-01-13 NOTE — Progress Notes (Signed)
Recreation Therapy Notes  INPATIENT RECREATION THERAPY ASSESSMENT  Patient Details Name: Lucas Newton MRN: [id number] DOB: [birth year] Today's Date: Jan 20, 2016  Patient Stressors: Death, Friends, Other (Comment) (Lost 2 brohters and his father in 08, 09 , and 56. Their birthdays were recent; lack of supportive friends; kicked out of home - was living with mother and sister)  Coping Skills:   Isolate, Arguments, Substance Abuse, Avoidance, Exercise, Talking, Music, Sports, Other (Comment) (Working)  Personal Challenges: Anger, Communication, Concentration, Decision-Making, Expressing Yourself, Relationships, Self-Esteem/Confidence, Social Interaction, Stress Management, Substance Abuse, Trusting Others  Leisure Interests (2+):  Individual - Other (Comment) (Work, spend time with nieces and nephews)  Awareness of Community Resources:  No  Community Resources:     Current Use:    If no, Barriers?:    Patient Strengths:  Chief Executive Officer, caring  Patient Identified Areas of Improvement:  Caring abou self, being more social  Current Recreation Participation:  Hanging out with family  Patient Goal for Hospitalization:  To start feeling better and get long term treatment  [place] of Residence:  [place] of Residence:  [place]   Current SI (including self-harm):  Yes  Current HI:  No  Consent to Intern Participation: N/A   Jacquelynn Cree, LRT/CTRS 01-20-16, 2:39 PM

## 2016-01-13 NOTE — Progress Notes (Signed)
Recreation Therapy Notes  Date: 02.08.17 Time: 3:00 pm Location: Craft Room  Group Topic: Self-esteem  Goal Area(s) Addresses:  Patient will be able to identify benefit of self-esteem. Patient will be able to identify ways to increase self-esteem.  Behavioral Response: Did not attend  Intervention: Self-Portrait  Activity: Patients were instructed to draw their self-portrait of how they felt. Patients were then instructed to write their name on a piece of paper and a positive trait. Patient's passed the paper around the room and wrote positive traits about peers. Patient drew their self-portrait again after they read the positive traits.  Education: LRT educated patients on ways they can increase their self-esteem.  Education Outcome: Patient did not attend group.   Clinical Observations/Feedback: Patient did not attend group.  Jacquelynn Cree, LRT/CTRS 01/13/2016 5:31 PM

## 2016-01-13 NOTE — Progress Notes (Signed)
Rates depression as a 5.  Affect sad and anxious.  Verbalizes that does not feel well due to withdrawal symptoms.  Support and encouragement offered.  Medicated according to orders.  Safety maintained.

## 2016-01-13 NOTE — Plan of Care (Signed)
Problem: Alteration in mood & ability to function due to Goal: STG-Patient will attend groups Outcome: Progressing Pt attended evening wrap-up group Goal: STG-Patient will comply with prescribed medication regimen (Patient will comply with prescribed medication regimen)  Outcome: Progressing Pt taking medications as prescribed

## 2016-01-13 NOTE — BHH Group Notes (Signed)
[facility] Group Notes:  (Nursing/MHT/Case Management/Adjunct)  Date:  01/13/2016  Time:  2:02 PM  Type of Therapy:  Psychoeducational Skills  Participation Level:  Active  Participation Quality:  Appropriate, Attentive and Sharing  Affect:  Appropriate  Cognitive:  Appropriate  Insight:  Appropriate  Engagement in Group:  Engaged  Modes of Intervention:  Discussion, Education and Support  Summary of Progress/Problems:  Lucas Newton 01/13/2016, 2:02 PM

## 2016-01-13 NOTE — Progress Notes (Addendum)
D: Pt is pleasant and cooperative this evening. Denies SI/HI/AVH at this time. Denies pain. Pt states that he came here for "my drug habits" and "I'm tired of going down that road." Pt requests PRN medication for sleep. A: Emotional support and encouragement provided. Medications administered with education. Pt NPO after midnight for abdominal ultrasound in the morning. q15 minute safety checks maintained. R: Pt remains free from harm.

## 2016-01-13 NOTE — BHH Group Notes (Signed)
[facility] LCSW Aftercare Discharge Planning Group Note   01/13/2016 2:42 PM  Participation Quality:  Patient attended and participated in group discussion introducing himself and sharing his SMART goal is to "talk to the doctor and social worker about rehab".   Mood/Affect:  Appropriate  Depression Rating:  10  Anxiety Rating:  9 for mental health but reports a 5 anxiety for physical health  Thoughts of Suicide:  No Will you contract for safety?   NA  Current AVH:  No  Plan for Discharge/Comments:  Patient is homeless and wants to go to rehab but will have to go to a shelter if nothing is available. Patient will need outpatient follow up in [employer]: will need assistance with public transportation  Supports: patient reports he has no support except maybe a friend or 2  Ernestine Mcmurray, Benita Stabile, MSW, [employer]

## 2016-01-13 NOTE — Plan of Care (Signed)
Problem: Alteration in mood & ability to function due to Goal: LTG-Patient demonstrates decreased signs of withdrawal (Patient demonstrates decreased signs of withdrawal to the point the patient is safe to return home and continue treatment in an outpatient setting)  Outcome: Not Progressing Continue to have difficulty with withdrawal symptoms

## 2016-01-13 NOTE — BHH Counselor (Signed)
Adult Comprehensive Assessment  Patient ID: Lucas Newton, male   DOB: 26-Jan-1986, 30 y.o.   MRN: [id number]  Information Source: Information source: Patient  Current Stressors:  Housing / Lack of housing: homeless-kicked out of sister and moms Substance abuse: uses everthing, alchol and opiates mostly Bereavement / Loss: lost 2 brohers and my father and there birthday just passed  Living/Environment/Situation:  Living Arrangements:  (homeless) How long has patient lived in current situation?: cannot return What is atmosphere in current home: Chaotic  Family History:  Marital status: Single Are you sexually active?: No What is your sexual orientation?: heterosexual Does patient have children?: No  Childhood History:  By whom was/is the patient raised?: Both parents Description of patient's relationship with caregiver when they were a child: alright Patient's description of current relationship with people who raised him/her: dad passed away, mom kicked me out but I still love her Does patient have siblings?: Yes Number of Siblings: 3 Description of patient's current relationship with siblings: 2 brothers passed away and have a sister still living Did patient suffer any verbal/emotional/physical/sexual abuse as a child?: No Did patient suffer from severe childhood neglect?: No Has patient ever been sexually abused/assaulted/raped as an adolescent or adult?: No Was the patient ever a victim of a crime or a disaster?: No Witnessed domestic violence?: No Has patient been effected by domestic violence as an adult?: No  Education:  Highest grade of school patient has completed: 11th Currently a student?: No Name of school: [employer] - in [place] Learning disability?: No  Employment/Work Situation:   Employment situation: Employed Where is patient currently employed?: [employer] How long has patient been employed?: 1 month but hopefully will stilll have transportation after I  get out of the hopsital with famly issues Patient's job has been impacted by current illness: Yes Describe how patient's job has been impacted: cant get transportaion becaue of relationship with faimly mmembers has changed What is the longest time patient has a held a job?: 10 years Where was the patient employed at that time?: [employer] work in [employer] Has patient ever been in the [employer]?: No Has patient ever served in combat?: No Did You Receive Any Psychiatric Treatment/Services While in Equities trader?: No Are There Guns or Other Weapons in Your Home?: No Are These Comptroller?:  (n/a)  Financial Resources:   Financial resources: Income from employment Does patient have a representative payee or guardian?: No  Alcohol/Substance Abuse:   What has been your use of drugs/alcohol within the last 12 months?: alcohle  12 pack daily, opiates using hearoein up to 2 grams daily Alcohol/Substance Abuse Treatment Hx: Past Tx, Inpatient If yes, describe treatment: [facility] and AA memetings Has alcohol/substance abuse ever caused legal problems?: No  Social Support System:   Conservation officer, nature Support System: Fair Museum/gallery exhibitions officer System: mom and sister love me but I cant stay there Type of faith/religion: Baptist How does patient's faith help to cope with current illness?: went to church  Leisure/Recreation:   Leisure and Hobbies: working  Strengths/Needs:   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 In what areas does patient struggle / problems for patient: being alone  Discharge Plan:   Does patient have access to transportation?: No Plan for no access to transportation at discharge: will need asstistance with transportation Will patient be returning to same living situation after discharge?: No Plan for living situation after discharge: substance abuse Tx or shelter in [place] Currently receiving community  mental health services: No If no, would  patient like referral for services when discharged?: Yes (What county?) [facility]) Does patient have financial barriers related to discharge medications?: Yes Patient description of barriers related to discharge medications: refer to [employer] as patient [place] [facility] [place]  Summary/Recommendations:   Emergency planning/management officer and Recommendations (to be completed by the evaluator): Patient is a 30 year old male admitted with a diagnosis of Opioid-induced depressive disorder. Patient presented to the hopital depressed with SI wanting to kill himself. Patient went to his sister's house where he lives with his sister [name]) and his mother Darius Bump [phone]) and she threw him out and patient is now homeless but interested in residential treatment for substance abuse. Patient reports he has Out of state Medicaid [facility]) and is interested in follow up at [facility] [phone]) after an inpatient rehab or residential program. Patient has a job but needs to call to see if he can keep it. Patient will benefit from crisis stabilization, medication evaluation, group therapy and psycho education in addition to  case management for dischaerge planning. At discharge, it is recommended that patient remain compliant with established discharge plan and continued treatment.   Lulu Riding., MSW, Theresia Majors [phone]  01/13/2016

## 2016-01-13 NOTE — Progress Notes (Signed)
[facility] MD Progress Note  01/13/2016 4:05 PM Lucas Newton  MRN:  [id number] Subjective:  Patient reports doing okay this morning. He denies suicidality, homicidality or having auditory or visual hallucinations. Mood is is still depressed, appetite is low, sleep is poor, energy is low. The patient continues to request to be sent from here to a substance abuse program.  As far as withdrawals patient reports some episodes of vomiting.  No other symptoms of withdrawal. Vital signs have been within the normal limits.  Per nursing he has not displayed any disruptive behaviors .  Per nursing: D: Pt is pleasant and cooperative this evening. Denies SI/HI/AVH at this time. Denies pain. Pt states that he came here for "my drug habits" and "I'm tired of going down that road." Pt requests PRN medication for sleep. A: Emotional support and encouragement provided. Medications administered with education. Pt NPO after midnight for abdominal ultrasound in the morning. q15 minute safety checks maintained. R: Pt remains free from harm.   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 Opioid use disorder, severe, dependence (HCC) [F11.20] 01/12/2016  . Opioid use with withdrawal (HCC) [F11.93] 01/12/2016  . Alcohol use disorder, moderate, dependence (HCC) [F10.20] 01/12/2016  . Tobacco use disorder [F17.200] 01/12/2016  . Hepatitis C [B19.20] 01/12/2016  . Stimulant use disorder (HCC)(cocaine) [F15.90] 01/12/2016  . Alcohol withdrawal (HCC) [F10.239] 01/12/2016  . 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 He has been in our unit about 5 times before. His last  hospitalization in our unit was in 04-23-12. In the past and been diagnosed with depression as well as substance abuse. He says he's been on "every" medication. Last time he was here he was on fluoxetine. Doesn't seem to stay compliant with medication. Most of the suicide attempts he talks about consist of taking too many drugs.  Patient states he has been admitted at [facility] or for substance abuse.  Past Medical History:  Past Medical History  Diagnosis Date  . Hepatitis C   . Depression   . Suicide attempt [facility])     Past Surgical History  Procedure Laterality Date  . Mandible fracture surgery      "Plate put in"   Family History: History reviewed. No pertinent family history. Patient reports that his father had an issue with opiate addiction. His father passed away in 04-23-2010. Patient has 2 brothers that committed suicide one overdose of medication and the other one shot himself.   Social History: Patient had been staying at his sister's house. 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 Now evidently his sister has thrown him out after he's been using a lot of drugs. Patient goes back and forth between [place] and [place]. Denies any legal history History  Alcohol Use  . Yes     History  Drug Use  . Yes    Comment: "Everything"    Social History   Social History  . Marital Status: Single    Spouse Name: N/A  . Number of Children: N/A  . Years of Education: N/A   Social History Main Topics  . Smoking status: Current Every Day Smoker  . Smokeless tobacco: None  .  Alcohol Use: Yes  . Drug Use: Yes     Comment: "Everything"  . Sexual Activity: Not Asked   Other Topics Concern  . None   Social History Narrative    Sleep: Fair  Appetite:  Fair  Current Medications: Current Facility-Administered Medications  Medication Dose Route Frequency Provider Last Rate Last Dose  . acetaminophen (TYLENOL) tablet 650 mg  650 mg Oral Q6H PRN Audery Amel, MD      .  alum & mag hydroxide-simeth (MAALOX/MYLANTA) 200-200-20 MG/5ML suspension 30 mL  30 mL Oral Q4H PRN Audery Amel, MD      . chlordiazePOXIDE (LIBRIUM) capsule 25 mg  25 mg Oral TID Jimmy Footman, MD   25 mg at 01/13/16 1050  . cyclobenzaprine (FLEXERIL) tablet 10 mg  10 mg Oral TID PRN Jimmy Footman, MD   10 mg at 01/13/16 1050  . ibuprofen (ADVIL,MOTRIN) tablet 800 mg  800 mg Oral TID PRN Jimmy Footman, MD   800 mg at 01/13/16 1050  . Influenza vac split quadrivalent PF (FLUARIX) injection 0.5 mL  0.5 mL Intramuscular Tomorrow-1000 Jimmy Footman, MD      . loperamide (IMODIUM) capsule 2 mg  2 mg Oral PRN Jimmy Footman, MD      . magnesium hydroxide (MILK OF MAGNESIA) suspension 30 mL  30 mL Oral Daily PRN Audery Amel, MD      . nicotine (NICODERM CQ - dosed in mg/24 hours) patch 21 mg  21 mg Transdermal Daily Jimmy Footman, MD   21 mg at 01/13/16 1050  . ondansetron (ZOFRAN) tablet 4 mg  4 mg Oral Q8H PRN Jimmy Footman, MD   4 mg at 01/13/16 1050  . traZODone (DESYREL) tablet 100 mg  100 mg Oral QHS PRN Jimmy Footman, MD   100 mg at 01/12/16 2204    Lab Results:  Results for orders placed or performed during the hospital encounter of 01/12/16 (from the past 48 hour(s))  Hepatic function panel     Status: Abnormal   Collection Time: 01/13/16 10:38 AM  Result Value Ref Range   Total Protein 7.6 6.5 - 8.1 g/dL   Albumin 4.2 3.5 - 5.0 g/dL   AST 782 (H) 15 - 41 U/L   ALT 456 (H) 17 - 63 U/L   Alkaline Phosphatase 106 38 - 126 U/L   Total Bilirubin 0.5 0.3 - 1.2 mg/dL   Bilirubin, Direct 0.1 0.1 - 0.5 mg/dL   Indirect Bilirubin 0.4 0.3 - 0.9 mg/dL    Physical Findings: AIMS:  , ,  ,  ,    CIWA:  CIWA-Ar Total: 8 COWS:     Musculoskeletal: Strength & Muscle Tone: within normal limits Gait & Station: normal Patient leans: N/A  Psychiatric Specialty Exam: Review of Systems   Constitutional: Negative.   HENT: Negative.   Eyes: Negative.   Respiratory: Negative.   Cardiovascular: Negative.   Gastrointestinal: Negative.   Genitourinary: Negative.   Musculoskeletal: Negative.   Skin: Negative.   Neurological: Negative.   Endo/Heme/Allergies: Negative.   Psychiatric/Behavioral: Positive for depression. Negative for suicidal ideas, hallucinations, memory loss and substance abuse. The patient is not nervous/anxious and does not have insomnia.     Blood pressure 103/59, pulse 63, temperature 97.7 F (36.5 C), temperature source Oral, resp. rate 18, height  (1.753 m), weight 73.483 kg (162 lb), SpO2 99 %.Body mass index is 23.91 kg/(m^2).  General Appearance: Well Groomed  Eye Contact::  Good  Speech:  Clear  and Coherent  Volume:  Normal  Mood:  Euthymic  Affect:  Congruent  Thought Process:  Linear and Logical  Orientation:  Full (Time, Place, and Person)  Thought Content:  Hallucinations: None  Suicidal Thoughts:  No  Homicidal Thoughts:  No  Memory:  Immediate;   Good Recent;   Good Remote;   Good  Judgement:  Good  Insight:  Fair  Psychomotor Activity:  Normal  Concentration:  Good  Recall:  Good  Fund of Knowledge:Good  Language: Good  Akathisia:  No  Handed:    AIMS (if indicated):     Assets:  Field seismologist  ADL's:  Intact  Cognition: WNL  Sleep:  Number of Hours: 6.75   Treatment Plan Summary: Daily contact with patient to assess and evaluate symptoms and progress in treatment and Medication management  30 year old with hepatitis C, opioid, alcohol, cannabis and cocaine dependence who presents to our emergency department after an overdose. Alcohol level was 36. Urine toxicology screen positive for cocaine, benzodiazepines and cannabis.  Opiate-induced depressive disorder: At this point in time we will hold off from starting antidepressants as most likely issues are secondary to loose social stressors,  patient is currently homeless and withdrawals.  Opiate withdrawal: He will be treated symptomatically. Continue Flexeril 10 mg 3 times a day, Imodium 3 times a day when necessary, ibuprofen 800 mg 3 times a day when necessary, Zofran 4 mg 3 times a day when necessary.  Alcohol withdrawal: Currently no objective evidence of withdrawal spell signs are within the normal limits and his heart rate is 64. Continue librium 25 mg tid.  Opiate dependence, cocaine dependence, cannabis dependence, alcohol dependence: Consider referral to inpatient substance abuse once stable---Will refer to [facility] or faith based residential.  Tobacco use disorder: continue nicotine patch 21 mg a day  Hepatitis C: Elevated AST and ALT. AST and ALT trending down.  Labs: LFT trending down  Imaging: Abdominal ultrasound: IMPRESSION: Fatty infiltration of liver.  Precautions every 15 minute checks along with withdrawal precautions  Disposition: pending admission to inpatient or residential substance abuse treatment.   Jimmy Footman, MD 01/13/2016, 4:05 PM

## 2016-01-13 NOTE — BHH Group Notes (Signed)
[place] LCSW Group Therapy  01/13/2016 3:30 PM  Type of Therapy:  Group Therapy  Participation Level:  Active  Participation Quality:  Appropriate and Attentive  Affect:  Appropriate  Cognitive:  Alert, Appropriate and Oriented  Insight:  Engaged  Engagement in Therapy:  Engaged  Modes of Intervention:  Discussion, Socialization and Support  Summary of Progress/Problems: Patient attended and participated in group discussion appropriately introducing himself and sharing that his 'super power' is "to heal others". Patient shared his struggle with addiction and being homeless and not having basic needs met which causes him to go into survival mode. Patient was able to offer supportive feedback to other group members and could relate to many other issues dicussed in group by group members.   Keene Breath, MSW, LCSWA 01/13/2016, 3:30 PM

## 2016-01-14 DIAGNOSIS — F4321 Adjustment disorder with depressed mood: Secondary | ICD-10-CM

## 2016-01-14 MED ORDER — TRAZODONE HCL 100 MG PO TABS: 100.0000 mg | ORAL_TABLET | Freq: Every evening | ORAL | Status: AC | PRN

## 2016-01-14 NOTE — Progress Notes (Signed)
Recreation Therapy Notes  INPATIENT RECREATION TR PLAN  Patient Details Name: Lucas Newton MRN: [id number] DOB: 1986/07/03 Today's Date: 01/14/2016  Rec Therapy Plan Is patient appropriate for Therapeutic Recreation?: Yes Treatment times per week: At least once a week TR Treatment/Interventions: 1:1 session, Group participation (Comment) (Appropriate participation in daily recreation therapy tx)  Discharge Criteria Pt will be discharged from therapy if:: Treatment goals are met, Discharged Treatment plan/goals/alternatives discussed and agreed upon by:: Patient/family  Discharge Summary Short term goals set: See Care Plan Short term goals met: Complete Progress toward goals comments: One-to-one attended One-to-one attended: Self-esteem, coping skills Reason goals not met: N/A Therapeutic equipment acquired: None Reason patient discharged from therapy: Discharge from hospital Pt/family agrees with progress & goals achieved: Yes Date patient discharged from therapy: 01/14/16   Leonette Monarch, LRT/CTRS 01/14/2016, 2:30 PM

## 2016-01-14 NOTE — Progress Notes (Signed)
D: Observed pt in bed awake. Patient alert and oriented x4. Patient denies SI/HI/AVH. Pt affect is anxious. Pt talked about his SI at admission and how it related to getting kicked out of sister's house and concerns about losing his job. Pt indicated improved mood when he found out that he still had his job and he would receive support from sister after discharge. Pt rated depression 0/10 and anxiety 7/10 but he indicated it was a "good anxiety" looking forward to discharge and rehab. Pt withdrawal symptoms were nausea, anxiety, and difficulty sleeping. A: Offered active listening and support. Provided therapeutic communication. Administered scheduled medications. Gave trazodone and Zofran prn. Encouraged pt to commit to rehab. R: Pt endorsed desire to be clean from drugs and alcohol. Pt pleasant and cooperative. Pt medication compliant. Will continue Q15 min. checks. Safety maintained.

## 2016-01-14 NOTE — Progress Notes (Signed)
  [facility] Adult Case Management Discharge Plan :  Will you be returning to the same living situation after discharge:  No. At discharge, do you have transportation home?: Yes,  Family  Do you have the ability to pay for your medications: Yes,  [facility]  Release of information consent forms completed and in the chart;  Patient's signature needed at discharge.  Patient to Follow up at: Follow-up Information    Follow up with [facility] In 2 days.   Why:  Your hospital follow up appointment will be walk in. Walk in hours are Monday through Friday between 8:00am and 10:00am. Call Lorella Nimrod [phone]   Contact information:   [address] [place] [address] [phone]       Schedule an appointment as soon as possible for a visit with [facility].   Specialty:  Primary Care   Why:  f/u for hepatitis C   Contact information:   [address] [place] [address] [phone]      Follow up with [facility].   Why:  *Information only* Call if you decide inpatient treatment is best for you.    Contact information:   [address],  [place], [place] [address] Phone: [phone]      Next level of care provider has access to [facility] Link:no  Safety Planning and Suicide Prevention discussed: Yes,  with patient and mother   Have you used any form of tobacco in the last 30 days? (Cigarettes, Smokeless Tobacco, Cigars, and/or Pipes): Yes  Has patient been referred to the Quitline?: Patient refused referral  Patient has been referred for addiction treatment: Pt. refused referral  [employer] MSW, LCSWA  01/14/2016, 11:02 AM

## 2016-01-14 NOTE — Tx Team (Signed)
Interdisciplinary Treatment Plan Update (Adult)  Date:  01/14/2016 Time Reviewed:  10:06 AM  Progress in Treatment: Attending groups: Yes. Participating in groups:  Yes. Taking medication as prescribed:  Yes. Tolerating medication:  Yes. Family/Significant othe contact made:  Yes, individual(s) contacted:  Mother Patient understands diagnosis:  Yes. Discussing patient identified problems/goals with staff:  Yes. Medical problems stabilized or resolved:  Yes. Denies suicidal/homicidal ideation: Yes. Issues/concerns per patient self-inventory:  Yes. Other:  New problem(s) identified: No, Describe:  NA  Discharge Plan or Barriers: Pt plans to go to a homeless shelter and follow up with [facility].   Reason for Continuation of Hospitalization: Depression Medication stabilization Suicidal ideation Withdrawal symptoms  Comments:Patient reports doing okay this morning. He denies suicidality, homicidality or having auditory or visual hallucinations. Mood is is still depressed, appetite is low, sleep is poor, energy is low. The patient continues to request to be sent from here to a substance abuse program.As far as withdrawals patient reports some episodes of vomiting. No other symptoms of withdrawal. Vital signs have been within the normal limits.  Estimated length of stay:m Pt will d/c  Today.   New goal(s): NA  Review of initial/current patient goals per problem list:   1.  Goal(s): Patient will participate in aftercare plan * Met: 01/14/2016  * Target date: at discharge * As evidenced by: Patient will participate within aftercare plan AEB aftercare provider and housing plan at discharge being identified.   2.  Goal (s): Patient will exhibit decreased depressive symptoms and suicidal ideations. * Met: 01/14/2016  *  Target date: at discharge * As evidenced by: Patient will utilize self rating of depression at 3 or below and demonstrate decreased signs of depression or be deemed stable for  discharge by MD.   3.  Goal(s): Patient will demonstrate decreased signs and symptoms of anxiety. * Met: 01/14/2016  * Target date: at discharge * As evidenced by: Patient will utilize self rating of anxiety at 3 or below and demonstrated decreased signs of anxiety, or be deemed stable for discharge by MD   4.  Goal(s): Patient will demonstrate decreased signs of withdrawal due to substance abuse * Met: 01/14/2016  * Target date: at discharge * As evidenced by: Patient will produce a CIWA/COWS score of 0, have stable vitals signs, and no symptoms of withdrawal.   Attendees: Patient:  CYPHER PAULE 2/9/201710:06 AM  Family:   2/9/201710:06 AM  Physician:  Dr. Jerilee Hoh   2/9/201710:06 AM  Nursing:   Sherrell Puller, RN  2/9/201710:06 AM  Case Manager:   2/9/201710:06 AM  Counselor:   2/9/201710:06 AM  Other:  Wray Kearns, [place] 2/9/201710:06 AM  Other:  Everitt Amber, LRT  2/9/201710:06 AM  Other:   2/9/201710:06 AM  Other:  2/9/201710:06 AM  Other:  2/9/201710:06 AM  Other:  2/9/201710:06 AM  Other:  2/9/201710:06 AM  Other:  2/9/201710:06 AM  Other:  2/9/201710:06 AM  Other:   2/9/201710:06 AM   Scribe for Treatment Team:   Jacoby Ritsema L Kanyon Bunn,MSW, [place]  01/14/2016, 10:06 AM

## 2016-01-14 NOTE — [facility] Suicide Risk Assessment (Signed)
[facility] INPATIENT:  Family/Significant Other Suicide Prevention Education  Suicide Prevention Education:  Education Completed; Lucas Newton (mother) [phone] has been identified by the patient as the family member/significant other with whom the patient will be residing, and identified as the person(s) who will aid the patient in the event of a mental health crisis (suicidal ideations/suicide attempt).  With written consent from the patient, the family member/significant other has been provided the following suicide prevention education, prior to the and/or following the discharge of the patient.  The suicide prevention education provided includes the following:  Suicide risk factors  Suicide prevention and interventions  National Suicide Hotline telephone number  [facility] assessment telephone number  [facility] Emergency Assistance 911  [facility] and/or Residential Mobile Crisis Unit telephone number  Request made of family/significant other to:  Remove weapons (e.g., guns, rifles, knives), all items previously/currently identified as safety concern.    Remove drugs/medications (over-the-counter, prescriptions, illicit drugs), all items previously/currently identified as a safety concern.  The family member/significant other verbalizes understanding of the suicide prevention education information provided.  The family member/significant other agrees to remove the items of safety concern listed above.  Lulu Riding, MSW, LCSWA 01/14/2016, 9:29 AM

## 2016-01-14 NOTE — Progress Notes (Signed)
D:  Patient expresses readiness for discharge today.  Patient denies any pain currently.  Patient denies suicidal ideation, homicidal ideation, auditory or visual hallucinations currently. A:  Medications and instructions for their use were reviewed with the patient and she voiced understanding.  Discharge instructions and follow up were reviewed with the patient.  Patient's belongings were returned upon her leaving the unit. R:  Patient signed for the return of her belongings.  Patient was cooperative with the discharge process.  Patient expressed understanding of discharge instructions, follow up, medications and their use.  Patient was escorted off the unit.  Patient remains safe at the time of discharge.     

## 2016-01-14 NOTE — Plan of Care (Signed)
Problem: Alteration in mood & ability to function due to Goal: STG-Patient will attend groups Outcome: Progressing Patient attending groups today

## 2016-01-14 NOTE — [facility] Suicide Risk Assessment (Addendum)
[facility] Discharge Suicide Risk Assessment   Principal Problem: Adjustment disorder with depressed mood Discharge Diagnoses:  Patient Active Problem List   Diagnosis Date Noted  . Adjustment disorder with depressed mood [F43.21] 01/14/2016  . Opioid use disorder, severe, dependence (HCC) [F11.20] 01/12/2016  . Alcohol use disorder, moderate, dependence (HCC) [F10.20] 01/12/2016  . Tobacco use disorder [F17.200] 01/12/2016  . Hepatitis C [B19.20] 01/12/2016  . Stimulant use disorder (HCC)(cocaine) [F15.90] 01/12/2016  . Alcohol withdrawal (HCC) [F10.239] 01/12/2016    Total Time spent with patient: 30 minutes   Psychiatric Specialty Exam: ROS                                                         Mental Status Per Nursing Assessment::   On Admission:     Demographic Factors:  Male, Caucasian and Low socioeconomic status  Loss Factors: Financial problems/change in socioeconomic status  Historical Factors: Prior suicide attempts and Impulsivity  Risk Reduction Factors:   Sense of responsibility to family and Employed  Continued Clinical Symptoms:  Alcohol/Substance Abuse/Dependencies Previous Psychiatric Diagnoses and Treatments  Cognitive Features That Contribute To Risk:  None    Suicide Risk:  Minimal: No identifiable suicidal ideation.  Patients presenting with no risk factors but with morbid ruminations; may be classified as minimal risk based on the severity of the depressive symptoms    Lucas Footman, MD 01/14/2016, 9:42 AM

## 2016-01-14 NOTE — Plan of Care (Signed)
Problem: [facility] Participation in Recreation Therapeutic Interventions Goal: STG-Patient will demonstrate improved self esteem by identif STG: Self-Esteem - Within 3 treatment sessions, patient will verbalize at least 5 positive affirmation statements in one treatment session to increase self-esteem post d/c.  Outcome: Completed/Met Date Met:  01/14/16 Treatment Session 1; Completed 1 out of 1: At approximately 10:40 am, LRT met with patient in back consultation room. Patient verbalized 5 positive affirmation statements. Patient reported it felt "good". LRT encouraged patient to continue saying positive affirmation statements. Intervention Used: I Am statements  Leonette Monarch, LRT/CTRS 02.09.17 2:28 pm Goal: STG-Patient will identify at least five coping skills for ** STG: Coping Skills - Within 3 treatment sessions, patient will verbalize at least 5 coping skills for substance abuse in one treatment session to decrease substance abuse post d/c.  Outcome: Completed/Met Date Met:  01/14/16 Treatment Session 1; Completed 1 out of 1: At approximately 10:40 am, LRT met with patient in back consultation room. Patient verbalized 5 coping skills for substance abuse. LRT educated patient on leisure and why it is important to implement into his schedule. LRT educated and provided patient with blank schedules to help him plan his day and try to avoid using substances. LRT educated patient on healthy support systems. Intervention Used: Coping Skills worksheet  Leonette Monarch, LRT/CTRS 02.09.17 2:30 pm

## 2016-01-14 NOTE — Plan of Care (Signed)
Problem: Alteration in mood & ability to function due to Goal: LTG-Patient demonstrates decreased signs of withdrawal (Patient demonstrates decreased signs of withdrawal to the point the patient is safe to return home and continue treatment in an outpatient setting)  Outcome: Progressing Pt woke up when writer came onto shift. Pt endorsed decreased withdrawal symptoms, only endorsing nausea and anxiety and this time.

## 2016-01-14 NOTE — Plan of Care (Signed)
Problem: Ineffective individual coping Goal: LTG: Patient will report a decrease in negative feelings Outcome: Progressing Patient reports feeling better today than he did yesterday     

## 2016-01-14 NOTE — Discharge Summary (Addendum)
Physician Discharge Summary Note  Patient:  Lucas Newton is an 30 y.o., male MRN:  [id number] DOB:  09/11/1986 Patient phone:  [phone] (home)  Patient address:   [address] [place] [address],  Total Time spent with patient: 30 minutes  Date of Admission:  01/12/2016 Date of Discharge: 01/14/16  Reason for Admission:  SI  Principal Problem: Adjustment disorder with depressed mood Discharge Diagnoses: Patient Active Problem List   Diagnosis Date Noted  . Adjustment disorder with depressed mood [F43.21] 01/14/2016  . Opioid use disorder, severe, dependence ([place]) [F11.20] 01/12/2016  . Alcohol use disorder, moderate, dependence ([place]) [F10.20] 01/12/2016  . Tobacco use disorder [F17.200] 01/12/2016  . Hepatitis C [B19.20] 01/12/2016  . Stimulant use disorder (HCC)(cocaine) [F15.90] 01/12/2016  . Alcohol withdrawal ([place]) [F10.239] 01/12/2016   History of Present Illness: 30 year old man with a history of depressive symptoms and substance abuse comes to the emergency room saying that he is finally sick and tired of living and is going to kill himself. He says that on Sunday he "tried to overdose" by taking a whole bunch of pills and drinking a lot of alcohol. After he did that he went back to his sister's house and she threw him out so now he has no place to stay. He said his mood had been okay until Saturday when it abruptly became severely depressed. He started having thoughts about wanting to die. He denies that he is having auditory or visual hallucinations. He does say that he hasn't been sleeping well or eating well for at least several days. He is not getting any outpatient psychiatric treatment at all. Patient states that he's been drinking a lot of alcohol and also using other drugs. Claims that he is snorting heroin as well as smoking marijuana and using cocaine.   Urine toxicology was positive for benzodiazepines, cocaine and cannabis. Alcohol level was 36. He has a  history of hepatitis C and his AST was 304 in his ALT is 637.  Today the patient reports that his sister kicked him out of the house because he wasn't staying out all night using drugs. He reports that she had some of his money in her wallet. He took this morning from her wallet and asked the sister found out she called the police. He is claiming he was suicidal and therefore the police brought him here. He says that he was thinking about overdosing or walking in the highway. Today he denies SI, HI or auditory or visual hallucinations but reports that he gets discharged something bad is going to happen.  Substance abuse history: Long history of abuse of alcohol marijuana and multiple other drugs. He indicates that he's had some periods of trying to stay clean for a little while and then will start binging again.  Patient says he drinks a 12 pack of beers a day. He is states that this or 24-onz cans, patient says he uses heroin daily. He is states he used crack but his last use was about one year ago. He says he takes pills that he buys on the street as many as he can get. He smokes about one pack of cigarettes per day.   Denies any access to guns.  Associated Signs/Symptoms: Depression Symptoms: depressed mood, insomnia, suicidal attempt, (Hypo) Manic Symptoms: denies Anxiety Symptoms: denies Psychotic Symptoms: denies PTSD Symptoms: Had a traumatic exposure: found his brother dead after he had commited suicide   Total Time spent with patient: 1 hour  Past  Psychiatric History: Multiple admissions to our unit. He has been in our unit about 5 times before. His last hospitalization in our unit was in 2012-02-24. In the past and been diagnosed with depression as well as substance abuse. He says he's been on "every" medication. Last time he was here he was on fluoxetine. Doesn't seem to stay compliant with medication. Most of the suicide attempts he talks about consist of taking too many  drugs.  Patient states he has been admitted at [facility] or for substance abuse.   Past Medical History: Suffers from hepatitis C  Family History: History reviewed. No pertinent family history.  Family Psychiatric History: Patient reports that his father had an issue with opiate addiction. His father passed away in 2010/02/23. Patient has 2 brothers that committed suicide one overdose of medication and the other one shot himself.  Social History: Patient had been staying at his sister's house. 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 Now evidently his sister has thrown him out after he's been using a lot of drugs. Patient goes back and forth between [place] and [place]. Denies any legal history       Past Medical History:  Past Medical History  Diagnosis Date  . Hepatitis C   . Depression   . Suicide attempt [facility])     Past Surgical History  Procedure Laterality Date  . Mandible fracture surgery      "Plate put in"   Family History: History reviewed. No pertinent family history.  Social History:  History  Alcohol Use  . Yes     History  Drug Use  . Yes    Comment: "Everything"    Social History   Social History  . Marital Status: Single    Spouse Name: N/A  . Number of Children: N/A  . Years of Education: N/A   Social History Main Topics  . Smoking status: Current Every Day Smoker  . Smokeless tobacco: None  . Alcohol Use: Yes  . Drug Use: Yes     Comment: "Everything"  . Sexual Activity: Not Asked   Other Topics Concern  . None   Social History Narrative    Hospital Course:    30 year old with hepatitis C, opioid, alcohol, cannabis and cocaine dependence who presents to our emergency department after an overdose. Alcohol level was 36. Urine toxicology screen positive for cocaine, benzodiazepines and cannabis.  Opiate-induced depressive disorder: At this point in time we will hold off from starting antidepressants as most likely issues are  secondary to loose social stressors, patient is currently homeless and withdrawals.  Opiate withdrawal: He will be treated symptomatically. Continue Flexeril 10 mg 3 times a day, Imodium 3 times a day when necessary, ibuprofen 800 mg 3 times a day when necessary, Zofran 4 mg 3 times a day when necessary.  During the hospitalization he did not show any objective evidence of opiate withdrawal  Alcohol withdrawal: Currently no objective evidence of withdrawal spell signs are within the normal limits and his heart rate is 64. Continue librium 25 mg tid.  During the hospitalization he did not show any objective evidence of alcohol withdrawal  Opiate dependence, cocaine dependence, cannabis dependence, alcohol dependence: Consider referral to inpatient substance abuse once stable---Will refer to [place] or faith based residential.  Tobacco use disorder: continue nicotine patch 21 mg a day  Hepatitis C: Elevated AST and ALT. AST and ALT trending down.  Labs: LFT trending down  Imaging: Abdominal ultrasound: IMPRESSION: Fatty  infiltration of liver.  Disposition: To a boarding house as arranged by his family.  Patient declined from going to a substance abuse residential facility once he learned he still had a job.  This hospital was uneventful. The patient reported high use of opiates and alcohol. However the patient did not show much evidence of withdrawal from any of these 2 substances. He did not display any disruptive or unsafe behaviors while in the unit. At all times he was calm, pleasant and cooperative. He participated actively in programming. He was respectful to staff and had appropriate interactions with his peers.  There was no need for seclusion, restraints or forced medications.  On discharge he denied suicidality, homicidality or psychosis. Mood was euthymic, affect was bright and reactive. He was hopeful, future oriented. Happy to find out that he still has a job. His sister decided to  help him find a boarding house. He says his boss was going to help him go to AA.  Physical Findings: AIMS:  , ,  ,  ,    CIWA:  CIWA-Ar Total: 8 COWS:     Musculoskeletal: Strength & Muscle Tone: within normal limits Gait & Station: normal Patient leans: N/A  Psychiatric Specialty Exam: Review of Systems  Constitutional: Negative.   HENT: Negative.   Eyes: Negative.   Respiratory: Negative.   Cardiovascular: Negative.   Gastrointestinal: Negative.   Genitourinary: Negative.   Musculoskeletal: Negative.   Skin: Negative.   Neurological: Negative.   Endo/Heme/Allergies: Negative.   Psychiatric/Behavioral: Positive for substance abuse. Negative for depression, suicidal ideas, hallucinations and memory loss. The patient is not nervous/anxious and does not have insomnia.     Blood pressure 108/68, pulse 58, temperature 98.2 F (36.8 C), temperature source Oral, resp. rate 18, height 5' 9"  (1.753 m), weight 73.483 kg (162 lb), SpO2 99 %.Body mass index is 23.91 kg/(m^2).  General Appearance: Well Groomed  Engineer, water::  Good  Speech:  Clear and Coherent  Volume:  Normal  Mood:  Euthymic  Affect:  Appropriate  Thought Process:  Linear  Orientation:  Full (Time, Place, and Person)  Thought Content:  Hallucinations: None  Suicidal Thoughts:  No  Homicidal Thoughts:  No  Memory:  Immediate;   Good Recent;   Good Remote;   Good  Judgement:  Good  Insight:  Good  Psychomotor Activity:  Normal  Concentration:  Good  Recall:  Good  Fund of Knowledge:Good  Language: Good  Akathisia:  No  Handed:    AIMS (if indicated):     Assets:  Communication Skills Talents/Skills Vocational/Educational  ADL's:  Intact  Cognition: WNL  Sleep:  Number of Hours: 7   Have you used any form of tobacco in the last 30 days? (Cigarettes, Smokeless Tobacco, Cigars, and/or Pipes): Yes  Has this patient used any form of tobacco in the last 30 days? (Cigarettes, Smokeless Tobacco, Cigars, and/or  Pipes) Yes, Yes, A prescription for an FDA-approved tobacco cessation medication was offered at discharge and the patient refused  Metabolic Disorder Labs:  No results found for: HGBA1C, MPG No results found for: PROLACTIN No results found for: CHOL, TRIG, HDL, CHOLHDL, VLDL, LDLCALC  Results for AADITH, RAUDENBUSH (MRN [id number]) as of 01/14/2016 17:23  Ref. Range 07/19/2012 14:54 01/11/2016 16:01 01/13/2016 09:09 01/13/2016 10:38  Sodium Latest Ref Range: 135-145 mmol/L 138 138    Potassium Latest Ref Range: 3.5-5.1 mmol/L 4.0 4.5    Chloride Latest Ref Range: 101-111 mmol/L 103 105  CO2 Latest Ref Range: 22-32 mmol/L 29 21 (L)    BUN Latest Ref Range: 6-20 mg/dL 13 16    Creatinine Latest Ref Range: 0.61-1.24 mg/dL 1.10 0.84    Calcium Latest Ref Range: 8.9-10.3 mg/dL 9.2 9.4    EGFR (Non-African Amer.) Latest Ref Range: >60 mL/min >60 >60    EGFR (African American) Latest Ref Range: >60 mL/min >60 >60    Glucose Latest Ref Range: 65-99 mg/dL 81 111 (H)    Anion gap Latest Ref Range: 5-15  6 (L) 12    Alkaline Phosphatase Latest Ref Range: 38-126 U/L 146 (H) 108  106  Albumin Latest Ref Range: 3.5-5.0 g/dL 4.3 4.7  4.2  AST Latest Ref Range: 15-41 U/L 41 (H) 304 (H)  192 (H)  ALT Latest Ref Range: 17-63 U/L 237 (H) 637 (H)  456 (H)  Total Protein Latest Ref Range: 6.5-8.1 g/dL 8.0 8.3 (H)  7.6  Bilirubin, Direct Latest Ref Range: 0.1-0.5 mg/dL    0.1  Indirect Bilirubin Latest Ref Range: 0.3-0.9 mg/dL    0.4  Total Bilirubin Latest Ref Range: 0.3-1.2 mg/dL 0.6 0.5  0.5  Osmolality Latest Ref Range: 275-301  275     WBC Latest Ref Range: 3.8-10.6 K/uL 9.0 8.2    RBC Latest Ref Range: 4.40-5.90 MIL/uL 5.00 5.21    Hemoglobin Latest Ref Range: 13.0-18.0 g/dL  15.7    HGB Latest Ref Range: 13.0-18.0 g/dL 15.4     HCT Latest Ref Range: 40.0-52.0 %  45.1    HCT Latest Ref Range: 40.0-52.0 % 43.9     MCV Latest Ref Range: 80.0-100.0 fL 88 86.5    MCH Latest Ref Range: 26.0-34.0 pg 30.8 30.1     MCHC Latest Ref Range: 32.0-36.0 g/dL 35.1 34.8    RDW Latest Ref Range: 11.5-14.5 % 12.5 13.9    Platelets Latest Ref Range: 150-440 K/uL 045 409    Salicylate Lvl Latest Ref Range: 2.8-30.0 mg/dL  <8.1    Salicylates, Serum Latest Units: mg/dL < 1.7     Acetaminophen (Tylenol), S Latest Ref Range: 10-30 ug/mL  <10 (L)    Acetaminophen Latest Units: mcg/mL < 2     Thyroid Stimulating Horm Latest Units: uIU/mL 0.77     Appearance Latest Ref Range: CLEAR   CLEAR (A)    Bacteria, UA Latest Ref Range: NONE SEEN   RARE (A)    Bilirubin Urine Latest Ref Range: NEGATIVE   NEGATIVE    Color, Urine Latest Ref Range: YELLOW   YELLOW (A)    Glucose Latest Ref Range: NEGATIVE mg/dL  NEGATIVE    Hgb urine dipstick Latest Ref Range: NEGATIVE   NEGATIVE    Ketones, ur Latest Ref Range: NEGATIVE mg/dL  NEGATIVE    Leukocytes, UA Latest Ref Range: NEGATIVE   NEGATIVE    Mucous Unknown  PRESENT    Nitrite Latest Ref Range: NEGATIVE   NEGATIVE    pH Latest Ref Range: 5.0-8.0   5.0    Protein Latest Ref Range: NEGATIVE mg/dL  NEGATIVE    RBC / HPF Latest Ref Range: 0-5 RBC/hpf  NONE SEEN    Specific Gravity, Urine Latest Ref Range: 1.005-1.030   1.012    Squamous Epithelial / LPF Latest Ref Range: NONE SEEN   NONE SEEN    WBC, UA Latest Ref Range: 0-5 WBC/hpf  0-5    Ethanol Latest Units: mg/dL < 3     Ethanol % Latest Ref Range: 0.000-0.080 % <  0.003     Alcohol, Ethyl (B) Latest Ref Range: <5 mg/dL  36 (H)    Amphetamines, Ur Screen Latest Ref Range: NONE DETECTED  NEGATIVE NONE DETECTED    Barbiturates, Ur Screen Latest Ref Range: NONE DETECTED  NEGATIVE NONE DETECTED    Benzodiazepine, Ur Scrn Latest Ref Range: NONE DETECTED  NEGATIVE POSITIVE (A)    Cocaine Metabolite,Ur [place] Latest Ref Range: NONE DETECTED  NEGATIVE POSITIVE (A)    Methadone Scn, Ur Latest Ref Range: NONE DETECTED   NONE DETECTED    MDMA (Ecstasy)Ur Screen Latest Ref Range: NONE DETECTED  NEGATIVE NONE DETECTED    Methadone, Ur  Screen Latest Ref Range: Cutoff-300 ng/mL  NEGATIVE     Cannabinoid 50 Ng, Ur [place] Latest Ref Range: NONE DETECTED  POSITIVE POSITIVE (A)    Opiate, Ur Screen Latest Ref Range: NONE DETECTED  NEGATIVE NONE DETECTED    Phencyclidine (PCP) Ur S Latest Ref Range: NONE DETECTED  NEGATIVE NONE DETECTED    Tricyclic, Ur Screen Latest Ref Range: NONE DETECTED  NEGATIVE NONE DETECTED      CLINICAL DATA: Transaminitis  EXAM: US ABDOMEN LIMITED - RIGHT UPPER QUADRANT  COMPARISON: None.  FINDINGS: Gallbladder:  No gallstones or wall thickening visualized. No sonographic Murphy sign noted by sonographer.  Common bile duct:  Diameter: 2.2 mm  Liver:  Increased echogenicity liver suggesting fatty infiltration. No focal liver lesion.  IMPRESSION: Fatty infiltration of liver. Negative for gallstones.   See Psychiatric Specialty Exam and Suicide Risk Assessment completed by Attending Physician prior to discharge.  Discharge destination:  Other:  [place]  Is patient on multiple antipsychotic therapies at discharge:  No   Do you recommend tapering to monotherapy for antipsychotics?  NA   Has Patient had three or more failed trials of antipsychotic monotherapy by history:  No  Recommended Plan for Multiple Antipsychotic Therapies: NA     Medication List    TAKE these medications      Indication   traZODone 100 MG tablet  Commonly known as:  DESYREL  Take 1 tablet (100 mg total) by mouth at bedtime as needed for sleep.          Signed: Hildred Priest, MD 01/14/2016, 9:41 AM

## 2016-06-21 ENCOUNTER — Emergency Department
Admission: EM | Admit: 2016-06-21 | Discharge: 2016-06-21 | Disposition: A | Discharge status: Home / Self Care | Payer: Medicaid - Out of State | Attending: Student | Admitting: Student

## 2016-06-21 ENCOUNTER — Encounter: Payer: Self-pay | Admitting: Emergency Medicine

## 2016-06-21 DIAGNOSIS — Z5181 Encounter for therapeutic drug level monitoring: Secondary | ICD-10-CM | POA: Insufficient documentation

## 2016-06-21 DIAGNOSIS — F1010 Alcohol abuse, uncomplicated: Secondary | ICD-10-CM

## 2016-06-21 DIAGNOSIS — F17200 Nicotine dependence, unspecified, uncomplicated: Secondary | ICD-10-CM | POA: Insufficient documentation

## 2016-06-21 DIAGNOSIS — F329 Major depressive disorder, single episode, unspecified: Secondary | ICD-10-CM | POA: Insufficient documentation

## 2016-06-21 DIAGNOSIS — F4321 Adjustment disorder with depressed mood: Secondary | ICD-10-CM | POA: Insufficient documentation

## 2016-06-21 DIAGNOSIS — F1910 Other psychoactive substance abuse, uncomplicated: Secondary | ICD-10-CM

## 2016-06-21 LAB — URINE DRUG SCREEN, QUALITATIVE (ARMC ONLY)
Amphetamines, Ur Screen: NOT DETECTED
BARBITURATES, UR SCREEN: NOT DETECTED
Benzodiazepine, Ur Scrn: POSITIVE — AB
CANNABINOID 50 NG, UR ~~LOC~~: POSITIVE — AB
COCAINE METABOLITE, UR ~~LOC~~: POSITIVE — AB
MDMA (ECSTASY) UR SCREEN: NOT DETECTED
Methadone Scn, Ur: NOT DETECTED
OPIATE, UR SCREEN: POSITIVE — AB
PHENCYCLIDINE (PCP) UR S: NOT DETECTED
TRICYCLIC, UR SCREEN: NOT DETECTED

## 2016-06-21 LAB — CBC WITH DIFFERENTIAL/PLATELET
Basophils Absolute: 0.1 10*3/uL (ref 0–0.1)
EOS ABS: 0.3 10*3/uL (ref 0–0.7)
Eosinophils Relative: 4 %
HCT: 45.7 % (ref 40.0–52.0)
HEMOGLOBIN: 16.2 g/dL (ref 13.0–18.0)
LYMPHS ABS: 2.5 10*3/uL (ref 1.0–3.6)
Lymphocytes Relative: 31 %
MCH: 31.1 pg (ref 26.0–34.0)
MCHC: 35.5 g/dL (ref 32.0–36.0)
MCV: 87.5 fL (ref 80.0–100.0)
MONO ABS: 0.8 10*3/uL (ref 0.2–1.0)
Neutro Abs: 4.4 10*3/uL (ref 1.4–6.5)
Platelets: 200 10*3/uL (ref 150–440)
RBC: 5.22 MIL/uL (ref 4.40–5.90)
RDW: 13.3 % (ref 11.5–14.5)
WBC: 8.1 10*3/uL (ref 3.8–10.6)

## 2016-06-21 LAB — COMPREHENSIVE METABOLIC PANEL
ALBUMIN: 4.6 g/dL (ref 3.5–5.0)
ALK PHOS: 76 U/L (ref 38–126)
ALT: 206 U/L — ABNORMAL HIGH (ref 17–63)
ANION GAP: 9 (ref 5–15)
AST: 90 U/L — ABNORMAL HIGH (ref 15–41)
BUN: 16 mg/dL (ref 6–20)
CALCIUM: 9 mg/dL (ref 8.9–10.3)
CO2: 26 mmol/L (ref 22–32)
Chloride: 102 mmol/L (ref 101–111)
Creatinine, Ser: 1.03 mg/dL (ref 0.61–1.24)
GFR calc non Af Amer: >60 mL/min (ref >60)
Glucose, Bld: 87 mg/dL (ref 65–99)
POTASSIUM: 3.4 mmol/L — AB (ref 3.5–5.1)
SODIUM: 137 mmol/L (ref 135–145)
TOTAL PROTEIN: 7.8 g/dL (ref 6.5–8.1)
Total Bilirubin: 1.4 mg/dL — ABNORMAL HIGH (ref 0.3–1.2)

## 2016-06-21 LAB — SALICYLATE LEVEL

## 2016-06-21 LAB — ETHANOL: Alcohol, Ethyl (B): <5 mg/dL (ref <5)

## 2016-06-21 LAB — ACETAMINOPHEN LEVEL: Acetaminophen (Tylenol), Serum: <10 ug/mL — ABNORMAL LOW (ref 10–30)

## 2016-06-21 MED ORDER — LORAZEPAM 1 MG PO TABS
1.0000 mg | ORAL_TABLET | Freq: Once | ORAL | Status: AC
  Administered 2016-06-21: 1 mg via ORAL
  Filled 2016-06-21: qty 1

## 2016-06-21 NOTE — ED Notes (Signed)
Discharge instructions reviewed with patient. Questions fielded by this RN. Patient verbalizes understanding of instructions. [facility] . No acute distress noted at time of discharge.  Pt picked up by [facility] for Detox.

## 2016-06-21 NOTE — ED Notes (Signed)
Patient to ER for medical clearance for ETOH detox.

## 2016-06-21 NOTE — ED Notes (Signed)
Pt is good to go to [facility] once medically cleared.

## 2016-06-21 NOTE — ED Notes (Signed)
Pt is medically cleared awaiting on discharge papers.

## 2016-06-21 NOTE — Discharge Instructions (Signed)
You are medically cleared for detox treatment. Proceed directly to [facility].

## 2016-06-21 NOTE — BH Specialist Note (Signed)
Referral information faxed to [facility].  TTS spoke with Misty StanleyLisa regarding referral information.

## 2016-06-21 NOTE — ED Provider Notes (Signed)
[facility] Emergency Department Provider Note   ____________________________________________  Time seen: Approximately 6:15 PM  I have reviewed the triage vital signs and the nursing notes.   HISTORY  Chief Complaint Medical Clearance    HPI Lucas Newton is a 30 y.o. male with history of hepatitis C, polysubstance abuse who presents for evaluation for detox. Patient has been drinking alcohol for a long time, he snorts heroin, uses cocaine, this is ongoing, chronic, severe, no modifying factors. He has no acute medical complaints. No chest pain, difficulty breathing, vomiting, diarrhea, fevers or chills. He reports to me that he has intermittently had right upper quadrant pain for some time which she attributes to his liver disease and drinking, he is not currently having any pain. He called [facility] and they told him to come to the emergency department for clearance for their detox program. Denies SI, HI or AVH. He last drank alcohol this morning, no history of seizures or complicated withdrawals.   Past Medical History  Diagnosis Date  . Hepatitis C   . Depression   . Suicide attempt [facility])     Patient Active Problem List   Diagnosis Date Noted  . Adjustment disorder with depressed mood 01/14/2016  . Opioid use disorder, severe, dependence (HCC) 01/12/2016  . Alcohol use disorder, moderate, dependence (HCC) 01/12/2016  . Tobacco use disorder 01/12/2016  . Hepatitis C 01/12/2016  . Stimulant use disorder (HCC)(cocaine) 01/12/2016  . Alcohol withdrawal (HCC) 01/12/2016    Past Surgical History  Procedure Laterality Date  . Mandible fracture surgery      "Plate put in"    Current Outpatient Rx  Name  Route  Sig  Dispense  Refill  . traZODone (DESYREL) 100 MG tablet   Oral   Take 1 tablet (100 mg total) by mouth at bedtime as needed for sleep.   30 tablet   0     Allergies Review of patient's allergies indicates no known allergies.  No  family history on file.  Social History Social History  Substance Use Topics  . Smoking status: Current Every Day Smoker  . Smokeless tobacco: None  . Alcohol Use: Yes    Review of Systems Constitutional: No fever/chills Eyes: No visual changes. ENT: No sore throat. Cardiovascular: Denies chest pain. Respiratory: Denies shortness of breath. Gastrointestinal: No abdominal pain.  No nausea, no vomiting.  No diarrhea.  No constipation. Genitourinary: Negative for dysuria. Musculoskeletal: Negative for back pain. Skin: Negative for rash. Neurological: Negative for headaches, focal weakness or numbness.  10-point ROS otherwise negative.  ____________________________________________   PHYSICAL EXAM:  Filed Vitals:   06/21/16 2030 06/21/16 2100 06/21/16 2116 06/21/16 2130  BP: 130/68 122/80 122/80 136/83  Pulse: 73 76 83 75  Temp:      TempSrc:      Resp: 18 18  18   Height:      Weight:      SpO2: 96% 98%  98%    VITAL SIGNS: ED Triage Vitals  Enc Vitals Group     BP 06/21/16 1616 153/96 mmHg     Pulse Rate 06/21/16 1616 74     Resp 06/21/16 1616 18     Temp 06/21/16 1616 98.1 F (36.7 C)     Temp Source 06/21/16 1616 Oral     SpO2 06/21/16 1616 98 %     Weight 06/21/16 1616 173 lb (78.472 kg)     Height 06/21/16 1616 5\' 9"  (1.753 m)  Head Cir --      Peak Flow --      Pain Score --      Pain Loc --      Pain Edu? --      Excl. in GC? --     Constitutional: Alert and oriented. Well appearing and in no acute distress. Eyes: Conjunctivae are normal. PERRL. EOMI. Head: Atraumatic. Nose: No congestion/rhinnorhea. Mouth/Throat: Mucous membranes are moist.  Oropharynx non-erythematous. Neck: No stridor.  Cardiovascular: Normal rate, regular rhythm. Grossly normal heart sounds.  Good peripheral circulation. Respiratory: Normal respiratory effort.  No retractions. Lungs CTAB. Gastrointestinal: Soft and nontender. No distention.  No CVA  tenderness. Genitourinary: Deferred Musculoskeletal: No lower extremity tenderness nor edema.  No joint effusions. Neurologic:  Normal speech and language. No gross focal neurologic deficits are appreciated. No gait instability. Skin:  Skin is warm, dry and intact. No rash noted. Psychiatric: Mood and affect are normal. Speech and behavior are normal.  ____________________________________________   LABS (all labs ordered are listed, but only abnormal results are displayed)  Labs Reviewed  URINE DRUG SCREEN, QUALITATIVE ([facility] ONLY) - Abnormal; Notable for the following:    Cocaine Metabolite,Ur [place] POSITIVE (*)    Opiate, Ur Screen POSITIVE (*)    Cannabinoid 50 Ng, Ur [place] POSITIVE (*)    Benzodiazepine, Ur Scrn POSITIVE (*)    All other components within normal limits  COMPREHENSIVE METABOLIC PANEL - Abnormal; Notable for the following:    Potassium 3.4 (*)    AST 90 (*)    ALT 206 (*)    Total Bilirubin 1.4 (*)    All other components within normal limits  ACETAMINOPHEN LEVEL - Abnormal; Notable for the following:    Acetaminophen (Tylenol), Serum <10 (*)    All other components within normal limits  ETHANOL  CBC WITH DIFFERENTIAL/PLATELET  SALICYLATE LEVEL   ____________________________________________  EKG  none ____________________________________________  RADIOLOGY  none ____________________________________________   PROCEDURES  Procedure(s) performed: None  Procedures  Critical Care performed: No  ____________________________________________   INITIAL IMPRESSION / ASSESSMENT AND PLAN / ED COURSE  Pertinent labs & imaging results that were available during my care of the patient were reviewed by me and considered in my medical decision making (see chart for details).  Lucas Newton is a 30 y.o. male with history of hepatitis C, polysubstance abuse who presents for evaluation for detox off of alcohol and multiple drugs. On exam, he is well-appearing  and in no acute distress, vital signs are stable, he is afebrile. He has a benign physical exam in no acute medical complaints. We'll obtain screening labs, consult [facility].  ----------------------------------------- 9:55 PM on 06/21/2016 ----------------------------------------- Patient is resting comfortably in no acute distress. I reviewed his labs. CBC unremarkable. CMP shows mild chronic AST elevation, moderate ALT elevation but these are down trending from prior. Mild T bili elevation at 1.4 is nonspecific. Negative acetaminophen and salicylate levels. Alcohol level less than 5. Urine drug screen positive for cannabis, cocaine, benzos and opiates. The patient is medically clear. [facility] to [facility] and they will come pick him up. DC home.  ____________________________________________   FINAL CLINICAL IMPRESSION(S) / ED DIAGNOSES  Final diagnoses:  Alcohol abuse  Polysubstance abuse      NEW MEDICATIONS STARTED DURING THIS VISIT:  New Prescriptions   No medications on file     Note:  This document was prepared using Dragon voice recognition software and may include unintentional dictation errors.  Gayla DossEryka A Annjeanette Sarwar, MD 06/21/16 2156

## 2016-06-21 NOTE — ED Notes (Signed)
[facility] lisa said doesn't have papers need to be faxed beofre accepted. Bonita QuinLinda to fax over to them.

## 2016-06-21 NOTE — ED Notes (Signed)
Pt resting in bed, states last drink was 5 am

## 2016-07-19 IMAGING — US US ABDOMEN LIMITED
1 series · 14 of 25 positions shown · non-contrast
Comparison: None.

CLINICAL DATA: Transaminitis

EXAM:
US ABDOMEN LIMITED - RIGHT UPPER QUADRANT

[Series 1: us abdomen limited · 0.17mm/px · 14 of 51 slices shown]
[im 1/51]
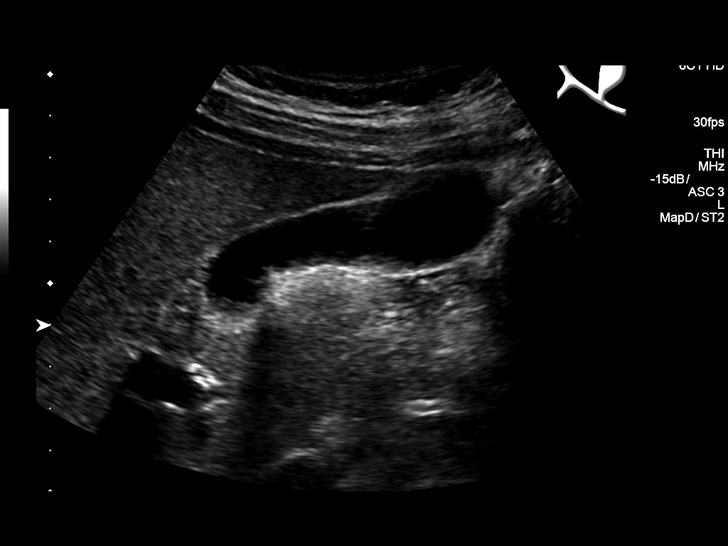
[im 5/51]
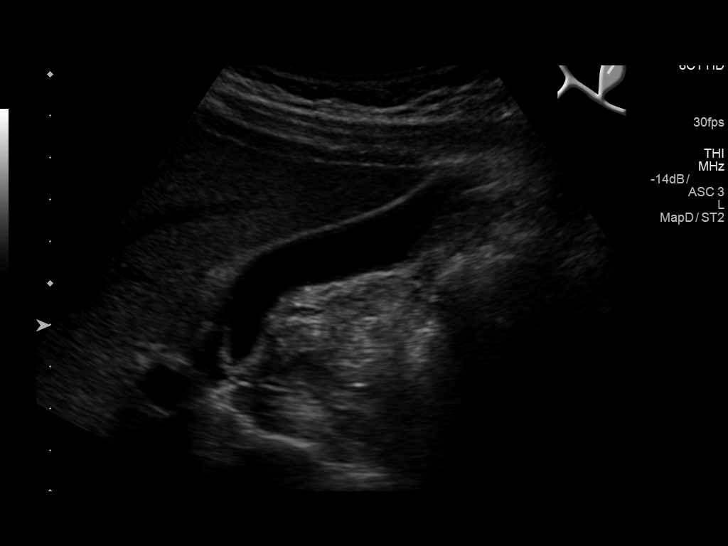
[im 9/51]
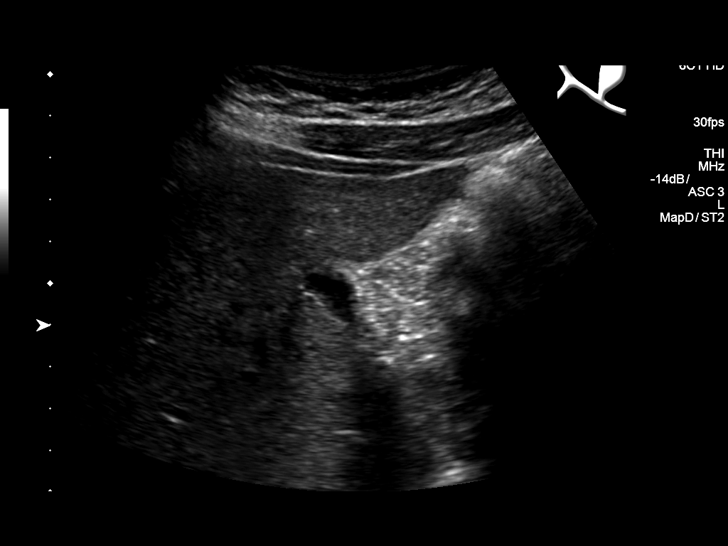
[im 13/51]
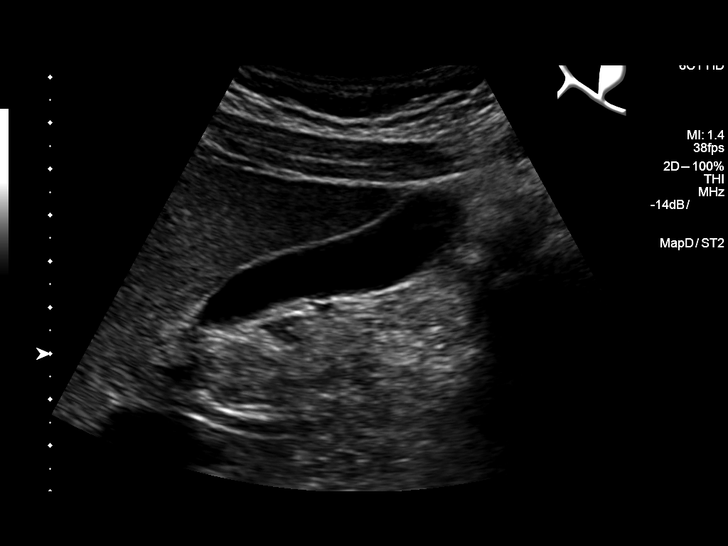
[im 17/51]
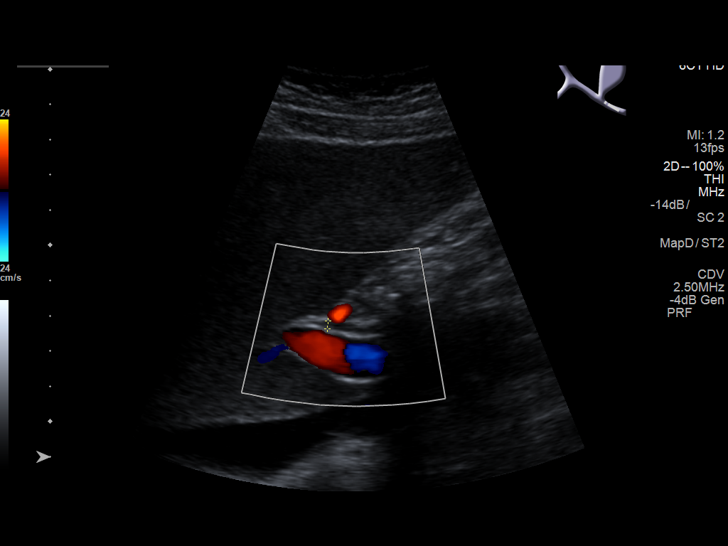
[im 19/51]
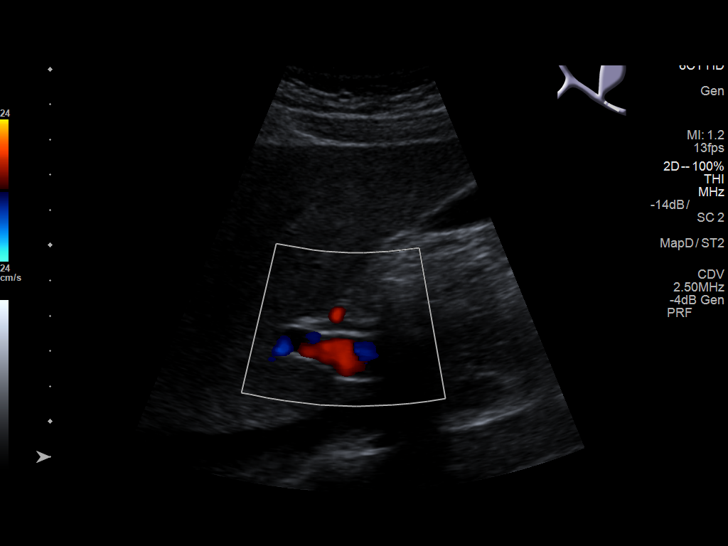
[im 23/51]
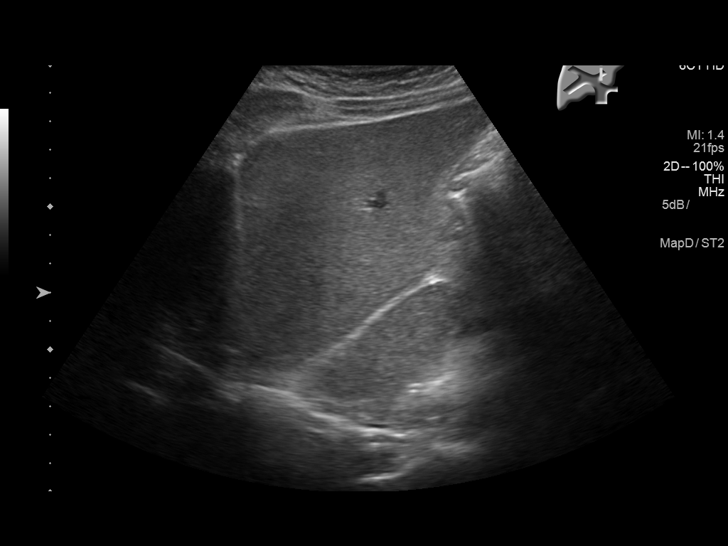
[im 28/51]
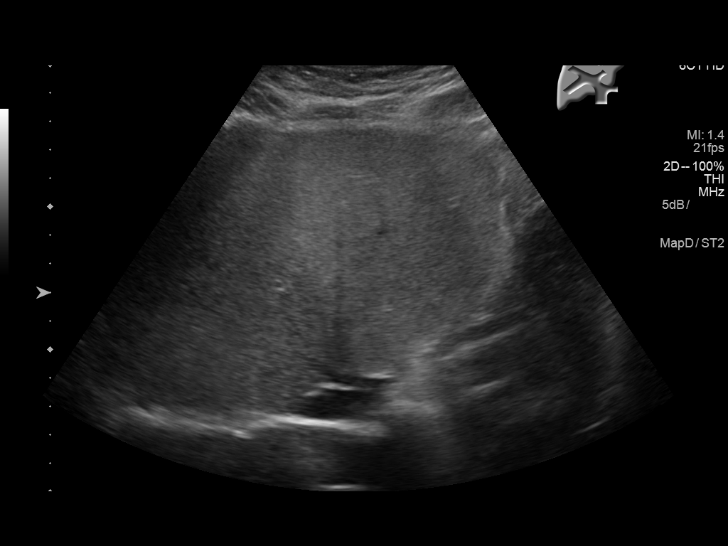
[im 32/51]
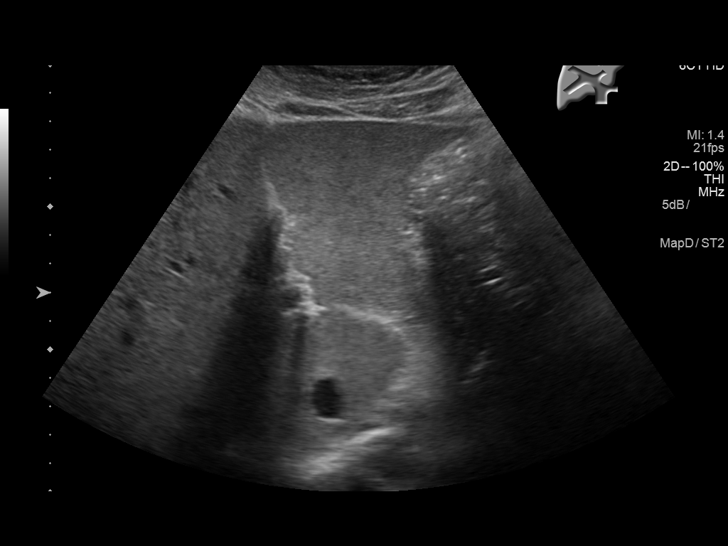
[im 34/51]
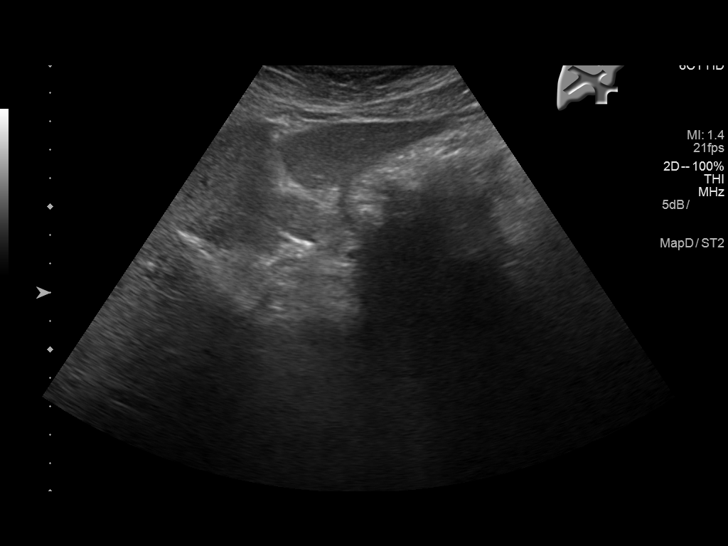
[im 38/51]
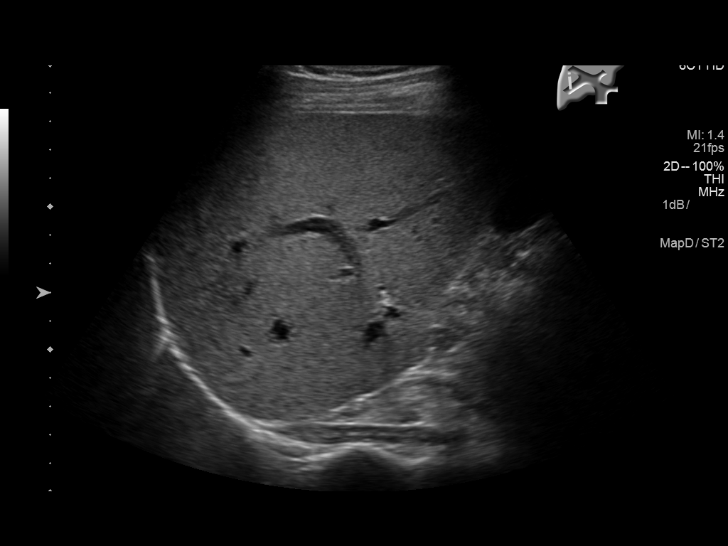
[im 42/51]
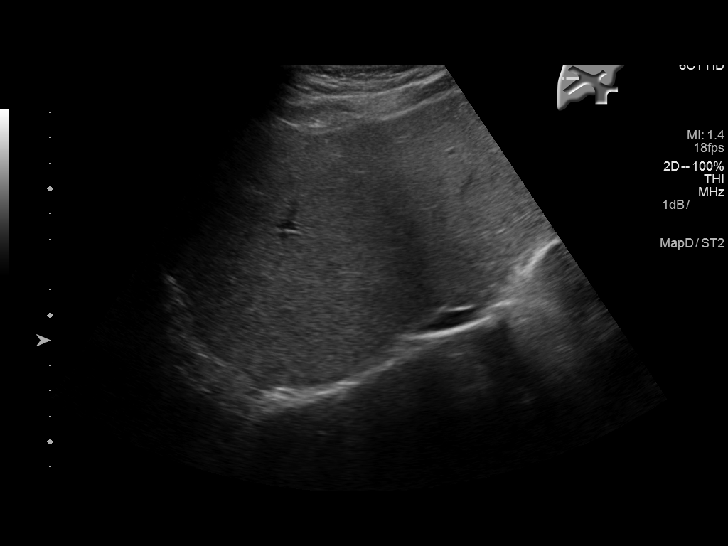
[im 46/51]
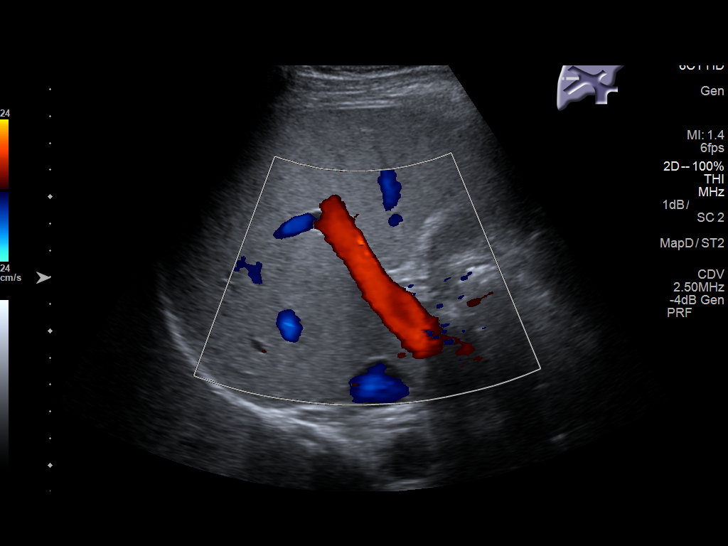
[im 51/51]
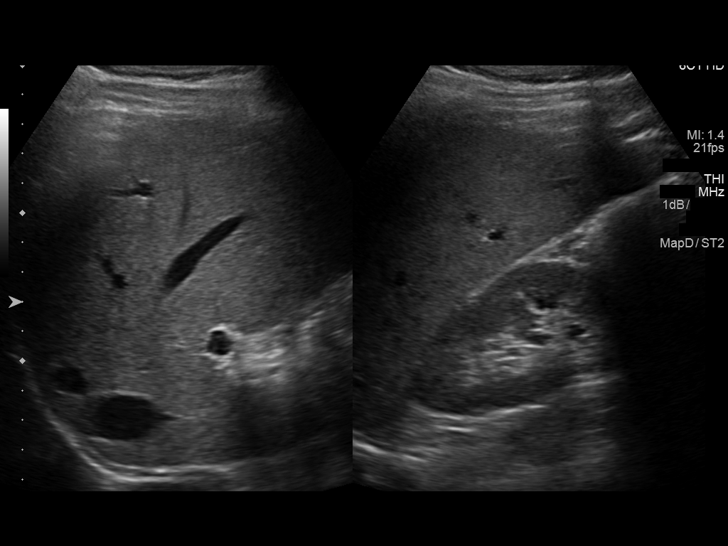

[14 of 25 positions shown; findings below may reference images not displayed]

FINDINGS: Gallbladder:

No gallstones or wall thickening visualized. No sonographic Murphy
sign noted by sonographer.

Common bile duct:

Diameter: 2.2 mm

Liver:

Increased echogenicity liver suggesting fatty infiltration. No focal
liver lesion.
IMPRESSION: Fatty infiltration of liver.  Negative for gallstones.

## 2016-12-11 ENCOUNTER — Inpatient Hospital Stay
Admit: 2016-12-11 | Discharge: 2016-12-11 | Disposition: A | Discharge status: Home / Self Care | Payer: PRIVATE HEALTH INSURANCE

## 2016-12-11 DIAGNOSIS — F1494 Cocaine use, unspecified with cocaine-induced mood disorder: Secondary | ICD-10-CM

## 2016-12-11 NOTE — ED Notes (Signed)
Patient is talking with MD at this time.

## 2016-12-11 NOTE — Unmapped (Signed)
SUBSTANCE ABUSE COMMUNITY RESOURCES:  Education, Advocacy, and Referrals   [facility] ([facility])   [phone]   Alcoholism Council:      [phone]   [facility] Alcohol & Drug Addiction (ADAS):    [phone]   [facility]      [phone]  Detoxification Services/Inpatient Treatment   [facility])  [phone]   [facility] Psychiatric Services   [phone]   [employer]      [phone]   [facility])   [phone]  Outpatient Treatment Services   [facility]    [address] [place])    [phone]    [address] [place])   [phone]   CCAT & OASIS (Older Adults Seeking Sobriety)   [phone]   [facility] Board (CCHB)   [phone]   [facility])   [phone]   [facility]     [phone]   [facility]      [phone]   [facility]    [phone]   [facility] Mental Health [place])  [phone]   [employer]      [phone]   [facility] [place]    [phone]   [place]       [phone]   [facility] (Opiate Addiction Recovery Services)    -Methadone Maintenance Therapy   [phone]   [facility]     [phone]   [place]      [phone]   [facility]   [phone]  Men's Treatment Services   [facility]      [phone]   [facility]      [phone]   [facility]      [phone]   [facility]     [address] ([place])     [phone]   [facility]      [phone]   [facility]      [phone]   [facility] ([employer])    [phone]   [employer]      [phone]   [employer] [facility]    [phone]   [facility] [place]     [phone]  [facility] Treatment  Services   [place]      [phone]   Roy[facility] Bake      [phone]   [facility] [employer]     [phone]   [facility]      [phone] [place])   [phone]   [facility] (must be [facility] resident)  [phone]  Support Groups for Alcohol and Chemical Dependency   [facility] Intergroup ResumeQuery.de.html  [phone]   Al-Anon (for family members) FactoringRate.ca  [phone]   Narcotics Anonymous     [phone]   [facility]:         SMART Recovery Https://www.smartrecovery.org       If feeling suicidal, call the suicide hotline at 513-281-CARE (2273), 911, or return to the emergency room.     SUICIDE PREVENTION RESOURCES:   [facility]:  513-281-CARE [phone]) hotline      Psychiatric Emergency Services: [phone]      Mobile Crisis Team [phone]   [facility]:  [facility] Consultation & Crisis: [phone]   [facility]: Emergency Crisis Hotline: 513-528-SAVE [phone])   [place]: [facility]  Emergency Crisis Line: [phone]   National:  National Hotline: 1-800-273-TALK 641-620-2143)      Www.suicidepreventionlifeline.orgg  ------------------------------------------------------------------------------------------------------------------  Discharge Instructions After an Episode of Suicide Threats or Actions   Remove all fireams, weapons (of any kind) or any unneeded medicines that could be used   Take person to follow-up mental health appointments   Have a loved one sign a release of information allowing you to contact the mental health provider   Be direct and talk openly about suicidal thoughts   Be willing to listen. Allow expressions of feelings   Block all inappropriate internet websites and social media   Offer to be available to them at any time   Be available to accompany them to the nearest emergency department   Call other family members or friends to help and offer  support       Get help from agencies that specialize in crisis intervention   Using the information here and other community resources, create a suicide safety plan. Example: call support networks, crisis lines, 911, go to emergency room, call Mobile Crisis Team, call mental health provider    Warning Signs:   Severe depression with restlessness   Inability to sleep or sleeping too much   Withdrawing from friends, family and life   Increase alcohol and/or drug use   Giving away personal items   Anxiety, agitation, rage, or anger   Unpredictable mood swings   Feelings like there is no way out   Hopelessness   Constantly talking or writing about death   Seeking out information about suicide    What Family Members & Friends Should Know:   Suicidal ideas are usually associated with treatable conditions   People who try or actually commit suicide try to let someone know by leaving a note   All suicide threats should be taken seriously   Suicidal ideas occur when people are depressed, intoxicated or irrational   Suicidal thinking can consume a person   Suicide risk increases when a person who has been severely depressed suddenly has more energy   Most common ways of suicide - pills, guns, poisons, hanging, carbon monoxide breathing, jumping off high places, and accidents    How Can I Help When Someone is Threatening Suicide?   Take Action!    1. Take his/her words seriously and respond with compassion    2. Do not leave them alone    3. Call 911 and have person taken to emergency room    4. Accompany the person to the emergency room and provide the physician with information

## 2016-12-11 NOTE — Unmapped (Signed)
[facility] Psychiatric Emergency  Service Evaluation    Reason for Visit/Chief Complaint: Drug Problem and Depression      Patient History     HPI     Pt is a 31 yo male with past psychiatric history of persistent mood disorder vs bipolar disorder who presents with depressed mood and problem with substance use. Pt states that he has been dealing with substance use since the age of 79. Historically heroin has been substance of choice. Recently moved [name] after completing a 90 day substance problem. Pt came [name] for a job, which feel through. Since the job fell through pt ran out of funding for methadone and he turned [name] to heroin. Pt then lost housing and has been living on the streets with his cousin. Pt notes that mood continues to be depression and he has passive thoughts that things are now longer worth living. Feels as thought cousin is his primary support. Denies active SI/HI with intent or plan. Feels as though if he had a more stable living environment his mood would improve and he would not have morbid thoughts of death.     Pt reports intermittent depressed mood, worse given current social situation. Notes decreased concentration, energy. Has not been able to sleep well due to living on the street. Does have anxiety and racing thoughts around current living situation. Denies AH/VH. Denies periods of increased energy, decreased need for sleep, or elated mood.     Recent substance use includes heroin four days ago, Cocaine two days ago, and 6 24 oz beers a day. Does feel like he is having some withdrawal symptoms from heroin. Notes tremor, restlessness, sweating. Denise hx of complicated withdrawal    When asked how he can best be helped. Pt states that he needs to get [name] on medications, needs housing resources, and substance resources.     PES Triage Screening:  Broset score: Risk Level: Very Low (0-3)           PSS- Safety Screen Score: 4  Suicide Screen: Is patient expressing  suicidal ideations?: Yes    Is Admission due to self harm?: No    Has Patient Attempted Suicide or Self Harm in past 72 hours?: No    Is Patient experiencing acute agitation, anxiety or insomnia?  : No    Context: stress  Location: Altered mental status of mood  Duration: 1 days.  Severity: moderate .  Associated Symptoms: moderate.  Modifying Factors: other: Polysubstance use: Heroin, cocaine, aclohol.  Timing: Constant.      Past Psychiatric History:     Hospitalizations: yes - Mutliple previous admission for mood disorder and substance use. Last admission 2015.    Past suicide attempts: Denies    History of violence: Denies    Substance Use History:   Cocaine: used one days ago.   Heroin: last use four days ago  Alcohol: 6 24 oz beers a day. Last use today    No known hx of complicated withdrawal requiring medical admission    PMH:       Past Medical History:   Diagnosis Date   ??? Anxiety    ??? Bipolar disorder (HCC)    ??? Bronchitis    ??? Depression    ??? Drug abuse and dependence (HCC)    ??? H/O tooth extraction    ??? Hepatitis C    ??? Liver disease Hep C   ??? PTSD (post-traumatic stress disorder)    ???  Substance induced mood disorder (HCC)    ??? Thyroid disease      I have reviewed the past medical history.  Additional history obtained: no    Social History:    Work History:  unemployed    Social History     Social History   ??? Marital status: Single     Spouse name: N/A   ??? Number of children: N/A   ??? Years of education: N/A     Social History Main Topics   ??? Smoking status: Current Every Day Smoker     Packs/day: 2.00     Years: 11.00     Types: Cigarettes   ??? Smokeless tobacco: Never Used   ??? Alcohol use Yes      Comment: daily   ??? Drug use: Yes     Types: Heroin, Other-see comments, Marijuana      Comment: last used 4 days ago   ??? Sexual activity: Yes     Partners: Female, Male     Birth control/ protection: Condom, Other-see comments     Other Topics Concern   ??? Caffeine Use Yes   ??? Occupational Exposure No   ???  Exercise No   ??? Seat Belt Yes     Social History Narrative   ??? None     I have reviewed the past social history.  Additional history obtained: no.    Family History:    Family History   Problem Relation Age of Onset   ??? Drug abuse Brother    ??? Hypertension Mother    ??? Transient ischemic attack Mother    ??? Seizures Father    ??? Hypertension Father    ??? Cancer Father    ??? Breast Cancer Father    ??? Drug abuse Brother      I have reviewed the past family history.  Additional history obtained: no.    Medications:  Previous Medications    No medications on file       Allergies:   Allergies as of 12/11/2016   ??? (No Known Allergies)       Review of Systems     Review of Systems      Physical Exam/Objective Data     ED Triage Vitals [12/11/16 1321]   Vital Signs Group      Temp 96.5 ??F (35.8 ??C)      Temp Source Oral      Heart Rate 98      Heart Rate Source Automatic      Resp 16      SpO2 95 %      BP (!) 149/96      BP Location Left arm      BP Method Automatic      Patient Position Sitting   SpO2 95 %   O2 Device None (Room air)       Physical Exam    Mental Status Exam:     Gait and Muscle Strength:  Normal and Muscle strength intact  Appearance and Behavior: Calm, Cooperative, Open Historian, Sad and Tearful      Sloppy, disheveled  Speech: NL articulation, prosody, volume and production  Language: Naming intact  Mood: depressed  Affect: sad and tearful  Thought Process and Associations: goal directed and no derailment       No loose associations  Thought Content: passive conditional suicidal/homicidal ideation positive, no plan  Abnormal or psychotic thoughts: None  Orientation: person, place, time/date and situation  Memory: recent, remote, and immediate recall intact  Attention and Concentration: concrete  Abstraction: Attention and concentration intact and Abstraction normal  Fund of Knowledge: average  Insight and Judgement: Full     Good        Labs:    Please see electronic medical record for any tests performed  in the ED.    No results found for this or any previous visit (from the past 24 hour(s)).    Radiology and EKG:  No results found.    EKG: Please see electronic medical record for any studies performed in the ED.    Emergency Course and Plan     SUKHMAN MARTINE is a 31 y.o. male who presented to the emergency department with Drug Problem and Depression    Pt presents with reported worsening of mood in the setting of homelessness and substance use. He has a long history of substance use, having been in multiple rehabs. Substance of choice heroin, last use four days. Pt has a long intermittent hx of depressive episodes and feels as thought recent exacerbation is secondary to non stable living conditions. Suicidal thought is passive, and conditional. No active SI/HI with intent or plan. Provides future orientation, actively engaging in conversation regarding rehab and housing. States that if he was to leave with housing and substance resources he felt as though he could be safe. Pt is accompanied by cousin, who is primary support. Pt likely needs psychiatric medications for mood, but this should not be prescribed within the emergency room setting.     Will provide resources on housing and substance programs. Given two bus passes. Have information for [facility] and [facility]. Encouraged him to follow up with a medical provider.     This patient has risks factors for future danger that include: (list all that apply)  Risk Factors:  Male  Family history of suicide  Substance Abuse   No outpatient psychiatric supports in place  Unemployed    However the patient has the following protective factors: (list all that apply)  Protective Factors:  Future oriented   No access to guns or weapons  Not actively endorsing SI or HI  In good medical health  No history of violence  Patient verbalizes a safety plan.    The patient is currently clinically sober, psychiatrically stable, and has a reasonable follow up plan.  Therefore, the patient  does not present as an imminent risk.        Diagnosis:    Primary psychiatric Diagnosis: Substance induced mood disorder vs Adjustment disorder with depressed mood.   Other psychiatric Diagnoses:   Substance Use Diagnoses: Cocaine use disorder, Alcohol use disorder, Opiate use disorder  Medical Diagnoses:     Global assessment of functioning: 51-60 moderate symptoms    Disposition:      Discharged from the ED. See [facility] for prescriptions, followup, and discharge instructions.   No emergency medical condition present at discharge., Patient not deemed to be an imminent threat of harm to self or others. and Patient has a good safety plan and discharge disposition in place.   Summary of rationale for disposition: See above.    Provider completing note: Resident, supervised by Dr. Letitia Libra.    Patient was in [place].    Patient had a completed Statement of Belief during this encounter: no    Medications given in PES: no.  Medications prescribed for home or inpatient use: no.  Laboratory work ordered: no.  Other diagnostic studies ordered:  no.  Old and/or outside medical records reviewed: yes.  Collateral information contacted: no.  Patient's outside provider contacted: no.    Ramond Craver, MD  Combined Family Medicine and Psychiatry PGY 4       Ramond Craver, MD  Resident  12/11/16 [phone]

## 2016-12-11 NOTE — Unmapped (Signed)
Patient was seen by the MD and able to be discharged. His discharge instructions were reviewed with the patient and a copy was given to him by the RN. He left with his cousin to go [facility] with bus tokens.

## 2016-12-11 NOTE — Unmapped (Signed)
31 yo [place] M that arrived with his cousin who is also registering to be seen. He claims that he is here because he is trying to get off of heroin and has not used in 4 days, but drowning himself in alcohol. He is also a daily drinker and drinks a 12 pack a day. He does have a history of withdrawal and been hospitalized before. He does have a history of being in rehab last one was 3-4 months ago in [place] of a 90 day program called the [facility]. He now realizes he should of not come back to [place] and leaving the program. Here he has been staying with the cousin's family until both of them got kicked out because the cousin's father is an alcoholic. He reports now that they are homeless and needing rehab. He reports that he is tired and sick of using. He reports that he has been thinking of jumping off a bridge because he can not do it anymore. He also reports that they have not been to a shelter because the cousin has no ID and he does not want to leave him. He has not tried a cold shelter yet. He claims that they walk all night and he has not got any sleep. He was left in [facility] secondary to hit thoughts of wanting to harm himself and given food and fluids. He is now resting in a chair.

## 2016-12-11 NOTE — Unmapped (Signed)
Patient is sitting in [facility] waiting to be seen.

## 2017-02-05 ENCOUNTER — Emergency Department: Admit: 2017-02-05 | Payer: PRIVATE HEALTH INSURANCE

## 2017-02-05 ENCOUNTER — Inpatient Hospital Stay
Admit: 2017-02-05 | Discharge: 2017-02-06 | Disposition: A | Discharge status: Home / Self Care | Payer: PRIVATE HEALTH INSURANCE

## 2017-02-05 DIAGNOSIS — A389 Scarlet fever, uncomplicated: Secondary | ICD-10-CM

## 2017-02-05 LAB — HIV AB, CONFIRM
HIV 1 Ab, Confirm: POSITIVE
HIV 2 Ab, Confirm: NEGATIVE
HIV Panel, Final Results: POSITIVE

## 2017-02-05 LAB — BASIC METABOLIC PANEL
Anion Gap: 7 mmol/L (ref 3–16)
BUN: 13 mg/dL (ref 7–25)
CO2: 29 mmol/L (ref 21–33)
Calcium: 8.5 mg/dL — ABNORMAL LOW (ref 8.6–10.3)
Chloride: 100 mmol/L (ref 98–110)
Creatinine: 1.16 mg/dL (ref 0.60–1.30)
Glucose: 102 mg/dL — ABNORMAL HIGH (ref 70–100)
Osmolality, Calculated: 282 mosm/kg (ref 278–305)
Potassium: 4.3 mmol/L (ref 3.5–5.3)
Sodium: 136 mmol/L (ref 133–146)
eGFR AA CKD-EPI: >90 See note.
eGFR NONAA CKD-EPI: 85 See note.

## 2017-02-05 LAB — TREPONEMA PALLIDUM AB WITH REFLEX: Treponema Pallidum: NEGATIVE

## 2017-02-05 LAB — HIV 1+2 ANTIBODY/ANTIGEN WITH REFLEX: HIV 1+2 AB/AGN: REACTIVE — AB

## 2017-02-05 LAB — MAGNESIUM: Magnesium: 2 mg/dL (ref 1.5–2.5)

## 2017-02-05 MED ORDER — metroNIDAZOLE (FLAGYL) tablet 2,000 mg
500 | Freq: Once | ORAL | Status: AC
Start: 2017-02-05 — End: 2017-02-05
  Administered 2017-02-06: 02:00:00 2000 mg via ORAL

## 2017-02-05 MED ORDER — ipratropium-albuterol (DUO-NEB) 0.5 mg-3 mg(2.5 mg base)/3 mL nebulizer solution 3 mL
0.5 | Freq: Once | RESPIRATORY_TRACT | Status: AC
Start: 2017-02-05 — End: 2017-02-05
  Administered 2017-02-06: 3 mL via RESPIRATORY_TRACT

## 2017-02-05 MED ORDER — predniSONE (DELTASONE) tablet 40 mg
20 | Freq: Once | ORAL | Status: AC
Start: 2017-02-05 — End: 2017-02-05

## 2017-02-05 MED ORDER — penicillin v potassium (VEETID) 500 MG tablet
500 | ORAL_TABLET | Freq: Two times a day (BID) | ORAL | 0 refills | Status: AC
Start: 2017-02-05 — End: 2017-02-15

## 2017-02-05 MED ORDER — lidocaine HCl (XYLOCAINE) 2 % viscous soln Soln 15 mL
2 | Freq: Once | Status: AC
Start: 2017-02-05 — End: 2017-02-05
  Administered 2017-02-05: 23:00:00 15 mL

## 2017-02-05 MED ORDER — cefTRIAXone (ROCEPHIN) injection 250 mg
250 | Freq: Once | INTRAMUSCULAR | Status: AC
Start: 2017-02-05 — End: 2017-02-05
  Administered 2017-02-05: 23:00:00 250 mg via INTRAMUSCULAR

## 2017-02-05 MED ORDER — oxymetazoline (AFRIN) 0.05 % nasal spray 2 spray
0.05 | Freq: Once | NASAL | Status: AC
Start: 2017-02-05 — End: 2017-02-05
  Administered 2017-02-05: 23:00:00 2 via NASAL

## 2017-02-05 MED ORDER — oxymetazoline (AFRIN) 0.05 % nasal spray
0.05 | NASAL | Status: AC
Start: 2017-02-05 — End: ?

## 2017-02-05 MED ORDER — albuterol (PROVENTIL) nebulizer solution 2.5 mg
2.5 | Freq: Once | RESPIRATORY_TRACT | Status: AC
Start: 2017-02-05 — End: 2017-02-05
  Administered 2017-02-06: 2.5 mg via RESPIRATORY_TRACT

## 2017-02-05 MED ORDER — azithromycin (ZITHROMAX) tablet 1,000 mg
250 | Freq: Once | ORAL | Status: AC
Start: 2017-02-05 — End: 2017-02-05
  Administered 2017-02-06: 02:00:00 1000 mg via ORAL

## 2017-02-05 MED ORDER — sodium chloride 0.9 % 1,000 mL IV fluid
Freq: Once | INTRAVENOUS | Status: AC
Start: 2017-02-05 — End: 2017-02-05
  Administered 2017-02-05: 23:00:00 via INTRAVENOUS

## 2017-02-05 MED ORDER — azithromycin (ZITHROMAX) powder 1 g
1 | Freq: Once | ORAL | Status: AC
Start: 2017-02-05 — End: 2017-02-05

## 2017-02-05 MED FILL — ALBUTEROL SULFATE 2.5 MG/3 ML (0.083 %) SOLUTION FOR NEBULIZATION: 2.5 2.5 mg /3 mL (0.083 %) | RESPIRATORY_TRACT | Qty: 3

## 2017-02-05 MED FILL — NASAL DECONGESTANT (OXYMETAZOLINE) 0.05 % SPRAY: 0.05 0.05 % | NASAL | Qty: 15

## 2017-02-05 MED FILL — METRONIDAZOLE 500 MG TABLET: 500 500 MG | ORAL | Qty: 4

## 2017-02-05 MED FILL — IPRATROPIUM 0.5 MG-ALBUTEROL 3 MG (2.5 MG BASE)/3 ML NEBULIZATION SOLN: 0.5 0.5 mg-3 mg(2.5 mg base)/3 mL | RESPIRATORY_TRACT | Qty: 3

## 2017-02-05 MED FILL — PREDNISONE 20 MG TABLET: 20 20 MG | ORAL | Qty: 2

## 2017-02-05 MED FILL — AZITHROMYCIN 1 GRAM ORAL PACKET: 1 1 gram | ORAL | Qty: 1

## 2017-02-05 MED FILL — CEFTRIAXONE 250 MG SOLUTION FOR INJECTION: 250 250 mg | INTRAMUSCULAR | Qty: 250

## 2017-02-05 MED FILL — AZITHROMYCIN 250 MG TABLET: 250 250 MG | ORAL | Qty: 4

## 2017-02-05 NOTE — Unmapped (Signed)
Pt throat has been bothering him for one week.  Glands are swelling in his throat

## 2017-02-05 NOTE — Unmapped (Signed)
EIP at bedside.

## 2017-02-05 NOTE — Unmapped (Signed)
RN gave patient sandwiches and juices no n/v.

## 2017-02-05 NOTE — ED Provider Notes (Signed)
[facility] ED Note    Date of service:  (Not on file)    Reason for Visit: Exposure to STI and Sore Throat      Patient History     HPI:  Alexander [name] is a 31 y.o. male with PMHx of anxiety, drug abuse, depression, hepatitis C  presents with chief complaint of Exposure to STI and Sore Throat    The patient will presents for sore throat, rash.  Per the patient, he had a sore throat 1.5 weeks ago.  He was seen by his doctor after approximately a week at [facility] clinic.  He reports that his sore throat came back 2 days ago.  He has had some trouble swallowing as well as a fairly muffled voice.  He reports no fevers.  However, over the last week and a half he has had a rash that is gradually developed.  It started in his groin and his armpits.  It is spread across his trunk.      Of note, with his groin rash, the patient reports that he has had sexual exposures in the last 6 months.  He is unsure of whether he wore condoms during this time.  He wishes to be treated for sexual transmitted infection.  He does not inject heroin, but does snort it.    Aside from the above, patient denies any aggravating or alleviating factors or associated symptoms.          Past Medical History:   Diagnosis Date    Anxiety     Bipolar disorder (HCC)     Bronchitis     Depression     Drug abuse and dependence (HCC)     H/O tooth extraction     Hepatitis C     Liver disease Hep C    PTSD (post-traumatic stress disorder)     Substance induced mood disorder (HCC)     Thyroid disease        Past Surgical History:   Procedure Laterality Date    MANDIBLE SURGERY         Alexander [name]  reports that he has been smoking Cigarettes.  He has a 22.00 pack-year smoking history. He has never used smokeless tobacco. He reports that he drinks alcohol. He reports that he uses drugs, including Heroin, Other-see comments, and Marijuana.    Previous Medications    No medications on file        Allergies:   Allergies as of 02/05/2017    (No Known Allergies)       Review of Systems     ROS:  As in HPI. All systems reviewed and otherwise negative. Specifically, negative forFever, chills, abdominal pain, nausea, emesis.    Physical Exam     Vitals:    02/05/17 1732   BP: 127/65   BP Location: Right arm   Patient Position: Sitting   Pulse: 116   Resp: 18   Temp: 99 F (37.2 C)   TempSrc: Oral   SpO2: 96%       General:  Caucasian male in NAD     HEENT:  Normocephalic, atraumatic. Mucous membranes are dry. Erythema throughout the throat without tonsillar exudates. Uvula midline. No drooling or trismus. Strawberry tongue. Nasopharyngoscopy revealed crisp vocal cords with mild erythema in his posterior pharynx, no signs of retropharyngeal abscess, no swollen uvula.    Neck:  Supple, trachea midline      Pulmonary:Slight wheezing bilaterally.    Cardiac:  Regular rate and rhythm. Normal S1 and S2. No murmurs/rubs/gallops.    Abdomen:  Soft, non-tender, non-distended. NABS     Extremities: 2+ radial pulses, no deformity, swelling, tenderness     Skin:  Sandpaper rash across torso. Excoriations in the groin and armpits without cellulitis.     GU: Testes without tenderness to palpation. Normal lie. No discharge at urethral meatus.     Neuro:  Alert and oriented x3. Speech normal. Moves UE and LE spontaneously.     Psych:  Mood and affect appropriate for situation.       Diagnostic Studies     Labs:    Please see electronic medical record for any tests performed in the ED     Radiology:    Please see electronic medical record for any tests performed in the ED    Emergency Department Procedures     Procedures      ED Course and MDM     Alexander [name] is a 31 y.o. male with a history and presentation as described above in HPI.  The patient was evaluated by myself and the ED Attending Physician, Dr. Lily Peer. All management and disposition plans were discussed and agreed upon. The patient was roomed and an IV  was placed. Appropriate labs and diagnostic studies were reviewed as they were made available. Pertinent laboratory studies in medical decision making are listed below.     Upon presentation, the patient was slightly tachycardic, afebrile.    The patient was arriving for rashes all over and sore throat.  He also reported remote history of STI exposure.  He had no external signs on genitalia exam of active chlamydia or gonorrhea, but stated he wished to be treated.  He was treated with Rocephin, azithromycin, Flagyl.  HIV test was sent as well.  This was found to be positive, so confirmatory HIV and syphilis was sent.    Patient had a rash diffusely through his body.  He had sore throat as well.  His symptoms appeared consistent with scarlet fever.  Therefore, strep throat swab was obtained.  His renal panel did not show significant dehydration.  Adhesive showed no signs of impending airway compromise.  He had wheezing on exam, likely secondary to asthma exacerbation from the strep throat.  He ordered given prednisone, DuoNeb times.  Chest x-ray ordered.    At this time I am going off-service and will be signing out care of this patient to my colleague Dr. Vela Prose for further care. My colleague's responsibilities will include:    - CXR  - Reassess  - Walk test   -HIV confirmatory testing    Consults:      None     Summary of Treatment in ED:    Medications - No data to display        Impression     Scarlet fever  HIV     [name], MD  Resident  02/05/17 [phone]

## 2017-02-05 NOTE — ED Provider Notes (Signed)
ED Attending Attestation Note    Date of service:  02/05/2017    This patient was seen by the resident physician.  I have seen and examined the patient, agree with the workup, evaluation, management and diagnosis. The care plan has been discussed and I concur.     My assessment reveals a 31 y.o. male he presents today complaining of sore throat and a rash.  He is also concern that he may have had an STI exposure and reports several sexual exposures last six months.  With regards to his sore throat he reports symptoms that began about a week and a half ago briefly improved and have now recurred along with the development of a rash in his groin and his axilla bilaterally.  On exam he is awake and alert 4 no acute distress has a hoarse raspy voice, no trismus, posterior oropharynx is erythematous with some postnasal mucus discharge noted, no exudate, no asymmetry, uvula is midline.  Neck is supple trachea is midline there is no tenderness overlying the tracheal tissues.  On lung exam he has scattered respiratory wheezes with no significant prolongation of the expiratory phase.  Abdomen is soft and nontender without rebound or guarding symmetric radial pulses.    I was present throughout the nasal pharyngoscopy by Dr. Clelia Croft.Marland Kitchen

## 2017-02-05 NOTE — Unmapped (Signed)
ED Reassessment Note     I assumed the care of this patient from the outgoing provider, Dr. Clelia Croft.  Please see his note for further details of history, physical exam, evaluation, and management thus far.  Briefly, Alexander [name] is a 31 y.o. male who presents with complaint of sore throat and STI exposure.    At the time of signout, the following was pending:  -chest xray  -duo neb treatment  -HIV testing  -walk test  -final disposition    Labs Reviewed   BASIC METABOLIC PANEL - Abnormal; Notable for the following:        Result Value    Glucose 102 (*)     Calcium 8.5 (*)     All other components within normal limits    Narrative:     As of 02/03/2015 the estimated GFR is calculated from serum creatinine using the Chronic Kidney Disease  Epidemiology Collaboration (CKD-EPI) equation in patients 18 years and older.  The reference range is   >60 mL/min/1.32m2.  eGFR values greater than 90 will be reported as >57mL/min/1.73m2.  Reference: Cherie Dark AS, Suella Grove [facility], Stefano Gaul AF, 3rd, Feldman HI, et. al.  A new equation to estimate glomerular filtration rate.  Ann Intern Med. 2009:150(9):604-12   MAGNESIUM   HIV 1+2 ANTIBODY/ANTIGEN WITH REFLEX   TREPONEMA PALLIDUM AB WITH REFLEX       Medications   cefTRIAXone (ROCEPHIN) injection 250 mg (250 mg Intramuscular Given 02/05/17 1750)   metroNIDAZOLE (FLAGYL) tablet 2,000 mg (2,000 mg Oral Given 02/05/17 2034)   lidocaine HCl (XYLOCAINE) 2 % viscous soln Soln 15 mL (15 mLs Mucous Membrane Given 02/05/17 1748)   oxymetazoline (AFRIN) 0.05 % nasal spray 2 spray (2 sprays Each Nare Given 02/05/17 1748)   sodium chloride 0.9 % 1,000 mL IV fluid ( Intravenous Stopped 02/05/17 1838)   azithromycin (ZITHROMAX) tablet 1,000 mg (1,000 mg Oral Given 02/05/17 2034)   predniSONE (DELTASONE) tablet 40 mg (0 mg Oral Return to [facility] 02/05/17 2035)   ipratropium-albuterol (DUO-NEB) 0.5 mg-3 mg(2.5 mg base)/3 mL nebulizer solution 3 mL (3 mLs Nebulization Given 02/05/17 1914)     Followed  by   albuterol (PROVENTIL) nebulizer solution 2.5 mg (2.5 mg Nebulization Given 02/05/17 1914)     Followed by   albuterol (PROVENTIL) nebulizer solution 2.5 mg (2.5 mg Nebulization Given 02/05/17 1914)       X-ray Chest PA and Lateral   Final Result   IMPRESSION:       No acute cardiopulmonary abnormality.      I have personally reviewed the images and I agree with this report.      Report Verified by: Orson Eva LEWIS at 02/05/2017 7:21 PM EST        The patient's chest x-ray was unremarkable.  The patient received a DuoNeb breathing treatment, after which he reported little symptomatic improvement of his dyspnea.  He states that his dyspnea is due to the pain in his throat, rather than a dysfunction with his lungs.  The patient was able to pass a walk test after his DuoNeb treatment, so is now safe to be discharged to home.    The patient was newly diagnosed with HIV here in the emergency department.  His confirmatory HIV test is still pending.  The EIP counselor has provided him with the contact information to call to follow up on his test results and also to get connected with HIV treatment.  With regards to the patient's STI exposure, the chlamydia, gonorrhea, and syphilis testing are also still pending.  He was presumptively treated here in the emergency department.  The patient reports that he does not have a phone, so he was given the phone number to call to follow up on his test results.    With regards to the patient's sore throat and rash, the previous provider felt fairly certain that this was scarlet fever due to strep infection.  The patient's rapid strep test has been negative; however, the PCR testing is still pending.  The patient was given a prescription for a 10 day stay course for Pen-Vee K, assuming that he actually does have a strep infection.    With regards to the patient's opioid use, he was given Narcan from the [facility].  He was also provided with a referral to our chemical dependency team.    At  this time, the patient's work up was completed and he is stable for discharge to home. The patient is reassured after his visit today. He understands his discharge instructions and has no further questions or complaints.       [name] was seen by myself as well as the Attending, Dr. Candie Chroman, MD, who is in agreement with the assessment and plan.      Impression     1. Strep pharyngitis with scarlet fever    2. HIV (human immunodeficiency virus infection) (HCC)    3. Opioid dependence, uncomplicated (HCC)          Plan     1) DISPOSITION: this patient was discharged home in good condition and return precautions were given  2) his PCP is No Pcp  3) Please see AVS for return precautions and discharge instructions    The patient was informed of the results of any tests, a time was given to answer questions, and a plan was proposed. After our discussion, he agreed with the treatment plan and felt comfortable being discharged to home.     [address], PA-C       [place], [place]  02/05/17 [phone]

## 2017-02-05 NOTE — Unmapped (Signed)
You were evaluated in the emergency department today for your STI exposure and sore throat.    I have given you a prescription for an antibiotic called penicillin for your sore throat and rash.  Take one tablet (500 mg) twice a day for the next 10 days.    I have also given you Narcan for heroin overdose.  I have given you a referral to our chemical dependency team if you are interested in rehabilitation.      Discharge instructions   You were presumptively treated for an STI, since your Chlamydia and Gonorrhea test results are still pending. Do not drink any alcohol for the next 3 days: there is a reaction with one of the medicines you were given and alcohol that can cause violent vomiting.    Since you told us that you do not have a phone, you need to call the ED nurse clinician line: Monday - Friday from 10AM - 4PM at [phone] for your Chlamydia, gonorrhea, and syphilis test results.     Call Coralie Carpen to find out the results of your HIV test. You were given Mr. Tamsen Roers business card here today.      Notify ALL of your partners of your diagnosis and treatment received today. All partners should be tested.     You and your partner(s) may also follow up at the health department.     Avoid sex for 14 days after all partners have been treated (not just you!) and always use condoms. Even condoms will not provide 100% protection against all forms of sexually transmitted disease.     Please return to the emergency department if your symptoms worsen or you develop a fever greater than 100.26F, rash, abdominal pain or other concerns that you feel warrant acute physician evaluation.      YOU SHOULD USE CONDOMS EVERY TIME YOU HAVE SEX, WHETHER IT IS VAGINAL INTERCOURSE, ANAL INTERCOURSE OR ORAL SEX

## 2017-02-05 NOTE — ED Notes (Signed)
RN referred patient to SW for transportation home.  Patient received bus pass and transfer cost of $0.50 to go to shelter on [address]. Patient expressed appreciation.    Elliot Gault MSW LSW

## 2017-02-05 NOTE — Unmapped (Signed)
Patient transported to CT Scan/X-ray via wheelchair.

## 2017-02-05 NOTE — ED Notes (Signed)
EIP completed rapid HIV test, reactive test results. MD notified and confirmatory test ordered. EIP answered pt's initial questions and concerns.

## 2017-02-05 NOTE — ED Notes (Signed)
Patient placed on SPO2 monitor and suction is set-up.

## 2017-02-05 NOTE — Unmapped (Signed)
RT at bedside

## 2017-02-06 LAB — STREP A SCRN, DNA PROBE IF NEG: Rapid Strep A Screen: NEGATIVE

## 2017-02-06 LAB — STREP A DNA - AMPLIFIED: Strep A DNA - Amplified: NEGATIVE

## 2017-02-16 NOTE — Unmapped (Signed)
NEW PATIENT INTAKE     Alexander [name]  26-Aug-1986  [id number]    * NEW DX  yes    * RESUMPTION OF CARE no     * TRANSFER FROM ANOTHER FACILITY no    DX WHERE:  [facility]  DX WHEN (Date):  02/05/2017    Insurance:  yes     Referred by: EIP  Referral Phone #:  [phone]  Resumption of Care: (If not seen at [facility] > 2 years) NO  Date Last Seen at [facility]:  N/A  Provider:  N/A    Transferred From (facility):  N/A    PROVIDER:  N/A  Address:  N/A     Phone:  N/A    Fax:  N/A    Records Requested: yes,  If Yes, from: (PCP or Referral Source or [facility]): In Epic         On ARVs no   Drug Name: n/a      SUFFICIENT SUPPLY:  no     If patient does not have enough medication and transferring from another provider, try to have them contact their current provider to cover their prescription for at least 3-5 months.      Are you Pregnant :   no    HOW MANY WEEKS?  n/a    OB PROVIDER:  n/a  Address:  n/a     Phone:  n/a    Homeless : yes     CASE MANAGER/CONTACT:  Janece Canterbury PHONE:  [phone]  Ext.[phone]    SCHEDULED NPI APPT DATE / TIME:  02/20/2017   Items discussed/reviewed with patient/contact:    * Appointment date / time confirmed  * Registration Process Explained - Patient to arrive 20-30 prior to appt, go to registration   1st, bring insurance card, photo ID and bottles of current medications  * Do you want APPT REMINDER CALL:  yes  * Do you want [place] to MAIL NEW PATIENT PACKET:  yes  * You will be getting a call from our intake coordinator to review what you will need to bring with you and answer any question you may have.  Copy forwarded to Deborha Payment and Remer Macho by Email yes

## 2017-02-16 NOTE — Unmapped (Signed)
Reminder call for 02/20/2017 NPI appointment with Intake Nurse.  Confirmed appointment with patient. Patient asked to stop at ground floor registration with photo ID and insurance information before coming to the first floor.  Call [phone] to cancel or reschedule.

## 2017-02-20 ENCOUNTER — Other Ambulatory Visit: Admit: 2017-02-20 | Payer: PRIVATE HEALTH INSURANCE

## 2017-02-20 ENCOUNTER — Institutional Professional Consult (permissible substitution): Admit: 2017-02-20 | Discharge: 2017-02-20 | Payer: PRIVATE HEALTH INSURANCE

## 2017-02-20 DIAGNOSIS — B20 Human immunodeficiency virus [HIV] disease: Secondary | ICD-10-CM

## 2017-02-20 LAB — HEPATIC FUNCTION PANEL
ALT: 27 U/L (ref 7–52)
AST: 33 U/L (ref 13–39)
Albumin: 4.1 g/dL (ref 3.5–5.7)
Alkaline Phosphatase: 64 U/L (ref 36–125)
Bilirubin, Direct: 0.08 mg/dL (ref 0.00–0.40)
Bilirubin, Indirect: 0.22 mg/dL (ref 0.00–1.10)
Total Bilirubin: 0.3 mg/dL (ref 0.0–1.5)
Total Protein: 6.5 g/dL (ref 6.4–8.9)

## 2017-02-20 LAB — DIFFERENTIAL
Basophils Absolute: 36 /uL (ref 0–200)
Basophils Relative: 1.2 % (ref 0.0–1.0)
Eosinophils Absolute: 3 /uL (ref 15–500)
Eosinophils Relative: 0.1 % (ref 0.0–8.0)
Lymphocytes Absolute: 1095 /uL (ref 850–3900)
Lymphocytes Relative: 36.5 % (ref 15.0–45.0)
Monocytes Absolute: 336 /uL (ref 200–950)
Monocytes Relative: 11.2 % (ref 0.0–12.0)
Neutrophils Absolute: 1530 /uL (ref 1500–7800)
Neutrophils Relative: 51 % (ref 40.0–80.0)
nRBC: 0 /100 WBC (ref 0–0)

## 2017-02-20 LAB — QUANTIFERON TB GOLD
QFT TB Ag minus Nil: 0.017 IU/mL
QuantiFERON Nil: 0.089 IU/mL
QuantiFERON TB Gold: NEGATIVE

## 2017-02-20 LAB — HEPATITIS C ANTIBODY
HCV Ab: REACTIVE
HCVAB Number: 14.13 S/CO (ref 0.00–0.79)

## 2017-02-20 LAB — RENAL FUNCTION PANEL W/EGFR
Albumin: 4.1 g/dL (ref 3.5–5.7)
Anion Gap: 9 mmol/L (ref 3–16)
BUN: 13 mg/dL (ref 7–25)
CO2: 26 mmol/L (ref 21–33)
Calcium: 9 mg/dL (ref 8.6–10.3)
Chloride: 107 mmol/L (ref 98–110)
Creatinine: 0.77 mg/dL (ref 0.60–1.30)
Glucose: 101 mg/dL (ref 70–100)
Osmolality, Calculated: 294 mOsm/kg (ref 278–305)
Phosphorus: 3.6 mg/dL (ref 2.1–4.7)
Potassium: 4.5 mmol/L (ref 3.5–5.3)
Sodium: 142 mmol/L (ref 133–146)
eGFR AA CKD-EPI: >90 See note.
eGFR NONAA CKD-EPI: >90 See note.

## 2017-02-20 LAB — HIV GENOSURE PRIME

## 2017-02-20 LAB — URINALYSIS, REFLEX TO CULTURE
Bilirubin, UA: NEGATIVE
Blood, UA: NEGATIVE
Glucose, UA: NEGATIVE mg/dL
Ketones, UA: NEGATIVE mg/dL
Leukocytes, UA: NEGATIVE
Nitrite, UA: NEGATIVE
Protein, UA: NEGATIVE mg/dL
Specific Gravity, UA: 1.015 (ref 1.005–1.035)
Urobilinogen, UA: <2 mg/dL (ref 0.2–1.9)
pH, UA: 6 (ref 5.0–8.0)

## 2017-02-20 LAB — LYMPHOCYTE SUBSET PANEL 1
% CD 3 Pos. Lymph.: 88.5 % (ref 56.0–89.0)
% CD 4 Pos. Lymph.: 22.5 % — ABNORMAL LOW (ref 30.0–64.0)
% CD 8 Pos. Lymph.: 62.6 % — ABNORMAL HIGH (ref 6.0–43.0)
% NK (CD56): 2.9 % (ref 1.0–19.0)
Ab NK (CD56): 27 {cells}/uL (ref 24–460)
Absolute CD 3: 832 {cells}/uL — ABNORMAL LOW (ref 870–2532)
Absolute CD 4 Helper: 218 {cells}/uL — ABNORMAL LOW (ref 255–2496)
Absolute CD 8 (Supp): 606 {cells}/uL (ref 155–1010)
CD19 % B Cell: 8.1 % (ref 1.0–28.0)
CD19 Abs: 74 {cells}/uL (ref 24–683)
CD4/CD8 Ratio: 0.36 ratio — ABNORMAL LOW (ref 1.10–3.70)

## 2017-02-20 LAB — HLA B*5701 TYPING: HLA B5701: NEGATIVE

## 2017-02-20 LAB — LIPID PANEL
Cholesterol, Total: 118 mg/dL (ref 0–200)
HDL: 35 mg/dL (ref 60–92)
LDL Cholesterol: 61 mg/dL
Triglycerides: 110 mg/dL (ref 10–149)

## 2017-02-20 LAB — CHLAMYDIA / GONORRHOEAE DNA URINE
Chlamydia Trachomatis DNA Urine: NEGATIVE
Neisseria gonorrhoeae DNA Urine: NEGATIVE

## 2017-02-20 LAB — CBC
Hematocrit: 37.3 % (ref 38.5–50.0)
Hemoglobin: 12.8 g/dL (ref 13.2–17.1)
MCH: 29.2 pg (ref 27.0–33.0)
MCHC: 34.2 g/dL (ref 32.0–36.0)
MCV: 85.4 fL (ref 80.0–100.0)
MPV: 9.9 fL (ref 7.5–11.5)
Platelets: 45 10*3/uL (ref 140–400)
RBC: 4.37 10*6/uL (ref 4.20–5.80)
RDW: 13.7 % (ref 11.0–15.0)
WBC: 3 10*3/uL (ref 3.8–10.8)

## 2017-02-20 LAB — HEPATITIS B SURFACE ANTIBODY, QUANTITATIVE
HBSAB NUMBER: >1000 m[IU]/mL (ref 0.00–7.99)
Hep B S Ab: REACTIVE

## 2017-02-20 LAB — HIV-1 RNA, QUANTITATIVE, PCR
HIV 1 Copies: 126434 copies/mL
HIV log10copies: 5.102 log10copy/mL

## 2017-02-20 LAB — HEPATITIS B SURFACE ANTIGEN: Hep B Surface Ag: NONREACTIVE

## 2017-02-20 LAB — TREPONEMA PALLIDUM AB WITH REFLEX: Treponema Pallidum: NEGATIVE

## 2017-02-20 LAB — HEPATITIS A IGM: Hep A IgM: NONREACTIVE

## 2017-02-20 LAB — HEPATITIS B CORE ANTIBODY: Hep B Core Total Ab: NONREACTIVE

## 2017-02-20 NOTE — Unmapped (Signed)
Follow up with DR Manson Passey on 04/03

## 2017-02-20 NOTE — Unmapped (Addendum)
NEW PATIENT INTAKE  31 Y.O. Male diagnosed with HIV on 02/05/2017. Initial HIV visit was on 02/20/2017. Has not been diagnosed with AIDS. Has not experienced prior antiretroviral treatment failure.    Pt was diagnosed while feeling ill, swollen lymph nodes and rash.  Last negative test was 2015.  Pt has been using heroin as well as sharing needles.  Pt is now on Suboxone .Marland Kitchen For 1-2 months,  Last used heroin 2 weeks ago.  Has a rash in his groin area.. Using ketoconazole .Marland Kitchen Seems to be going away.    [facility]  Working with [employer] ... Looking into housing,  Just started working at [employer]  Met with Almira Coaster for [facility]  Doesn't want research to talk with him just yet.Marland Kitchen Possibly in the future.. Wants to wait for addiction and homeless situation to be better.     Reviewed hiv folder.. Patient verbalized understanding    Physical Exam   Constitutional: He is oriented to person, place, and time and well-developed, well-nourished, and in no distress.   Eyes: Conjunctivae and EOM are normal. Pupils are equal, round, and reactive to light.   Neck: Normal range of motion. Neck supple.   Cardiovascular: Normal rate, regular rhythm and normal heart sounds.    Pulmonary/Chest: Effort normal and breath sounds normal.   Abdominal: Soft. Bowel sounds are normal.   Neurological: He is alert and oriented to person, place, and time.   Skin: Skin is warm and dry.   Psychiatric: Mood, memory, affect and judgment normal.     Review of Systems   Constitutional: Negative for fever.   HENT: Negative for ear pain and sinus pain.    Eyes: Negative for double vision.   Respiratory: Negative for cough and shortness of breath.    Cardiovascular: Negative for chest pain.   Gastrointestinal: Negative for constipation and nausea.   Musculoskeletal: Negative for joint pain and neck pain.   Skin: Positive for rash.   Neurological: Negative for dizziness and headaches.   Psychiatric/Behavioral: Positive for substance abuse.  Negative for depression. The patient is nervous/anxious.          VITALS & LMP / Pregnant    Diagnosis when and where 02/05/2017 [facility]   Diagnosed with AIDS (low CD4) - PCP or thrush na   Risk Factors ivdu   Currently on ART regimen    (missed doses in the last week)    Adverse Events/Side Effects from ART    Syphilis (Dates and Tx) na   Flu Shot given    Current Living Situation Homeless shelter   PHQ9 Score / Cage Score phq 9/ cage 4/4     Chief Complaints TODAY:    Review: Med. Hx, Allergies, Meds, Labs, Social Hx (sex, alch, smk, IVDU)        Little interest or pleasure in doing things:              Several days   Feeling down, depressed, or hopeless: Several days   Trouble falling or staying asleep, or sleeping too much:   Several days   Feeling tired or having little energy: Several days   Poor appetite or overeating: Several days   Feeling bad about yourself - or that you are a failure or have let yourself or your family down: Nearly every day   Trouble concentrating on things, such as reading the newspaper or watching television:           Several  days   Moving or speaking so slowly that other people could have noticed. Or the opposite - being so fidgety or restless that you have been moving around a lot more than usual:   Not at all   Thoughts that you would be better off dead, or of hurting yourself in some way: Not at all   PHQ-9 Total Score: 9   If you checked off any problems, how difficult have these problems made it for you to do your work, take care of things at home, or get along with other people:   Somewhat difficult       Education  Safe Sex / Disclosure  Medication Adherence   Risk Reduction & Trans. Prev. Reviewed Steps Brochure (living positive, being healthy)   Notifying Past Partners - [facility] CD4 / Viral Load - HIV Education    Remer Macho explain RW Eligibility and COC - [phone] Reviewed [place] Intake Folder     [facility]???s - Research Chyrel [place]) [phone]  Reviewed [place] resource / [facility] Case  Management     Plan  Complete NPI Labs   Sign a Medical Release Form   Schedule appointment with [facility] Provider 2-4 weeks   [facility] Referred      Patient Discharge Instructions  Call [phone] to cancel or change appointment    Follow up with Case Management - [place]    Prep for partners who are HIV negative - contact front desk  to schedule appointment

## 2017-03-07 ENCOUNTER — Ambulatory Visit: Admit: 2017-03-07 | Discharge: 2017-03-07 | Payer: PRIVATE HEALTH INSURANCE

## 2017-03-07 DIAGNOSIS — B20 Human immunodeficiency virus [HIV] disease: Secondary | ICD-10-CM

## 2017-03-07 MED ORDER — abacavir-dolutegravir-lamivud (TRIUMEQ) 600-50-300 mg Tab
600-50-300 | ORAL_TABLET | Freq: Every day | ORAL | 1 refills | Status: AC
Start: 2017-03-07 — End: 2017-04-11

## 2017-03-07 NOTE — Unmapped (Signed)
Alexander [name] is a 31 y.o. male with a history of IDU, hepatitis C infection, & recently-diagnosed HIV infection who presents to the [facility] to establish ongoing care for HIV.    Chief Complaint: HIV  Chief Complaint   Patient presents with   ??? HIV Visit       HPI:  Alexander [name] is a 31 y.o. male diagnosed with HIV on 02/05/17. His initial IDC visit was on 02/20/17. He has not been diagnosed with AIDS. His CD4 nadir was 218 on 02/20/17. His self reported risk behavior for acquiring HIV was IDU. He has not experienced prior antiretroviral treatment failure.     Today is his first visit to see me. His last CD4 was 218.0(22.5%) on 02/20/2017 and last viral load was 126,434 on 02/20/2017.    He is ART-naive.    Alexander [name] denies acute illness. He is feeling OK today.    ROS:  Positives appear in bold; otherwise listed symptoms are negative.  General: fever, chills  Eyes: double vision, vision loss  ENT: nasal congestion, sore throat  Cardio: chest pain, palpitations  Pulm: cough, shortness of breath  GI: nausea, vomiting, diarrhea  GU: dysuria, hematuria  Neuro: dizziness, headache  Rheum: joint pain, myalgias  Psych: depression, anxiety  Skin: rash, pruritis    Full ROS was completed and was otherwise negative.     Past medical, family, and social history reviewed with patient and updated as appropriate in the electronic medical record.    Physical Exam:  Blood pressure 145/80, pulse 86, temperature 97.7 ??F (36.5 ??C), temperature source Oral, resp. rate 16, height 5' 9 (1.753 m), weight 150 lb (68 kg), SpO2 99 %.   General: Slightly fatigued-appearing male in NAD.  HEENT: PERRL / EOMI. Sclerae anicteric. Mucous membranes moist. Oropharynx clear without lesions.  Neck: No cervical / supraclavicular LAD.   CV/Chest: RRR with no r/g/m. S1 & S2 present. No peripheral edema.  Pulm: CTAB without w/r/r. Normal effort.  Abd: Soft, non-tender, non-distended. Bowel sounds present.   MSK: Able to move all 4 extremities. No joint  effusions noted.  Skin: Warm & dry. No rash or skin lesions noted.  Neuro: Sensation grossly intact to light touch throughout. No tremor appreciated.  Psych: A&Ox3. Normal affect.    Current Outpatient Prescriptions   Medication Sig   ??? buprenorphine-naloxone Place 2 tablets under the tongue daily.   ??? ketoconazole Apply topically daily.     No current facility-administered medications for this visit.        Data Review:  Lab Results   Component Value Date    HGB 12.8 (L) 02/20/2017    HCT 37.3 (L) 02/20/2017    WBC 3.0 (L) 02/20/2017    PLT 45 (L) 02/20/2017     Lab Results   Component Value Date    NEUTOPHILPCT 51.0 02/20/2017    LYMPHOPCT 36.5 02/20/2017    MONOPCT 11.2 02/20/2017    EOSPCT 0.1 02/20/2017    BASOPCT 1.2 (H) 02/20/2017    NEUTROABS 1,530 02/20/2017    LYMPHSABS 1,095 02/20/2017    MONOSABS 336 02/20/2017    EOSABS 3 (L) 02/20/2017    BASOSABS 36 02/20/2017     Lab Results   Component Value Date    NA 142 02/20/2017    K 4.5 02/20/2017    CL 107 02/20/2017    CO2 26 02/20/2017    GLUCOSE 101 (H) 02/20/2017    BUN 13 02/20/2017    CREATININE 0.77  02/20/2017    MG 2.0 02/05/2017    PHOS 3.6 02/20/2017     Lab Results   Component Value Date    ALKPHOS 64 02/20/2017    AST 33 02/20/2017    ALT 27 02/20/2017    ALBUMIN 4.1 02/20/2017    ALBUMIN 4.1 02/20/2017    BILIDIRECT 0.08 02/20/2017    BILITOT 0.3 02/20/2017    PROT 6.5 02/20/2017       Labs reviewed as above.    Health maintenance and screening:  Lab Results   Component Value Date    CHOLTOT 118 02/20/2017    TRIG 110 02/20/2017    HDL 35 (L) 02/20/2017    LDL 61 02/20/2017       HBA1c:   Lab Results   Component Value Date    HGBA1C 5.0 11/14/2013     ASCVD risk: The ASCVD Risk score Denman George DC Jr., et al., 2013) failed to calculate for the following reasons:    The 2013 ASCVD risk score is only valid for ages 52 to 52    Vitamin D: No results found for: VITD25H  DEXA: none    Hepatitis A screening:   Lab Results   Component Value Date    HEPAIGG  Nonreactive 12/12/2013     Hepatitis B screening:   Lab Results   Component Value Date    HEPBSAG Nonreactive 02/20/2017    HEPBCAB Nonreactive 02/20/2017    HBSAB Reactive (A) 02/20/2017     Hepatitis C screening:   Lab Results   Component Value Date    HCVAB Reactive (A) 02/20/2017    IU 1,610,960 12/12/2013        TB screening:   Lab Results   Component Value Date    QUANTIFERTB NEGATIVE 02/20/2017     Syphilis screening:   Lab Results   Component Value Date    TREPIA Negative 02/20/2017    RPRMONSCRN Non-Reactive 11/15/2013     GC screening:   Lab Results   Component Value Date    [facility] Negative 02/20/2017       Anal cancer screening:   Colon cancer screening: due at age 20    HLA B5701:   Lab Results   Component Value Date    HLAB5701 Negative 02/20/2017       Immunizations:  Influenza:   PCV: due  PPSV: next due 12/2018  Tdap: due?  Hep A: due  Hep B: immune  Immunization History   Administered Date(s) Administered   ??? Influenza Preservative Free 12/11/2013   ??? Pneumococcal Polysaccharide 12/11/2013       Assessment and Plan:  HIV infection (CMS Dx)  Diagnosed about 1 month ago; risk factor is IDU. He is ART naive & HLA B5701 negative. Genotype predicts no resistance.  - start Triumeq  - plan to obtain restaging labs as well as HCV RNA at next visit  - discussed importance of medication adherence for efficacy and to minimize the risk of viral resistance  - discussed prevention of HIV transmission including not sharing needles, use of condoms with intercourse, & strategies such as PrEP for sexual partners    Preventative health care  - give PCV-13 today      Return to [facility] in ~4-6 weeks.    I have spent 55 minutes face to face with this patient with greater than 50% of this time spent in counseling and coordination of care discussing topics including: natural history of HIV and goals of treatment, importance  of excellent medication adherence and avoiding skipping or spacing out doses, methods to prevent  HIV transmission to others.     Alexander [name] ALLEN Samariyah Cowles

## 2017-03-07 NOTE — Unmapped (Signed)
I sent a prescription for Triumeq to your [facility] -- you can pick this up tomorrow and start taking it every day to control your HIV!    Please come back to the [facility] in about 4-6 weeks for your next visit.    If you have any questions, problems, or concerns in the meantime, please give me a call!

## 2017-03-16 NOTE — Assessment & Plan Note (Signed)
Diagnosed about 1 month ago; risk factor is IDU. He is ART naive & HLA B5701 negative. Genotype predicts no resistance.  - start Triumeq  - plan to obtain restaging labs as well as HCV RNA at next visit  - discussed importance of medication adherence for efficacy and to minimize the risk of viral resistance  - discussed prevention of HIV transmission including not sharing needles, use of condoms with intercourse, & strategies such as PrEP for sexual partners    Preventative health care  - give PCV-13 today

## 2017-04-10 NOTE — Unmapped (Signed)
Reminder call for 04/11/2017 EST appointment with Dr. Brown.   Message left asking patient to arrive 20 minutes early to update information at ground floor registration with photo id, insurance or financial information. To cancel or reschedule call [phone] and select option 1.

## 2017-04-11 ENCOUNTER — Ambulatory Visit: Admit: 2017-04-11 | Payer: PRIVATE HEALTH INSURANCE

## 2017-04-11 ENCOUNTER — Other Ambulatory Visit: Admit: 2017-04-11 | Payer: PRIVATE HEALTH INSURANCE

## 2017-04-11 DIAGNOSIS — R110 Nausea: Secondary | ICD-10-CM

## 2017-04-11 DIAGNOSIS — B20 Human immunodeficiency virus [HIV] disease: Secondary | ICD-10-CM

## 2017-04-11 LAB — LYMPHOCYTE SUBSET PANEL 1
% CD 3 Pos. Lymph.: 74.1 % (ref 56.0–89.0)
% CD 4 Pos. Lymph.: 34.9 % (ref 30.0–64.0)
% CD 8 Pos. Lymph.: 33.9 % (ref 6.0–43.0)
% NK (CD56): 4.6 % (ref 1.0–19.0)
Ab NK (CD56): 47 {cells}/uL (ref 24–460)
Absolute CD 3: 742 {cells}/uL — ABNORMAL LOW (ref 870–2532)
Absolute CD 4 Helper: 342 {cells}/uL (ref 255–2496)
Absolute CD 8 (Supp): 333 {cells}/uL (ref 155–1010)
CD19 % B Cell: 19.6 % (ref 1.0–28.0)
CD19 Abs: 200 {cells}/uL (ref 24–683)
CD4/CD8 Ratio: 1.03 ratio — ABNORMAL LOW (ref 1.10–3.70)

## 2017-04-11 LAB — RENAL FUNCTION PANEL W/EGFR
Albumin: 4.7 g/dL (ref 3.5–5.7)
Anion Gap: 11 mmol/L (ref 3–16)
BUN: 8 mg/dL (ref 7–25)
CO2: 25 mmol/L (ref 21–33)
Calcium: 9.4 mg/dL (ref 8.6–10.3)
Chloride: 108 mmol/L (ref 98–110)
Creatinine: 0.83 mg/dL (ref 0.60–1.30)
Glucose: 84 mg/dL (ref 70–100)
Osmolality, Calculated: 296 mosm/kg (ref 278–305)
Phosphorus: 3.6 mg/dL (ref 2.1–4.7)
Potassium: 3.8 mmol/L (ref 3.5–5.3)
Sodium: 144 mmol/L (ref 133–146)
eGFR AA CKD-EPI: >90 See note.
eGFR NONAA CKD-EPI: >90 See note.

## 2017-04-11 LAB — HEPATIC FUNCTION PANEL
ALT: 272 U/L — ABNORMAL HIGH (ref 7–52)
AST: 327 U/L — ABNORMAL HIGH (ref 13–39)
Albumin: 4.7 g/dL (ref 3.5–5.7)
Alkaline Phosphatase: 90 U/L (ref 36–125)
Bilirubin, Direct: 0.1 mg/dL (ref 0.00–0.40)
Bilirubin, Indirect: 0.3 mg/dL (ref 0.00–1.10)
Total Bilirubin: 0.4 mg/dL (ref 0.0–1.5)
Total Protein: 7.8 g/dL (ref 6.4–8.9)

## 2017-04-11 LAB — CBC
Hematocrit: 45.3 % (ref 38.5–50.0)
Hemoglobin: 15.8 g/dL (ref 13.2–17.1)
MCH: 30.4 pg (ref 27.0–33.0)
MCHC: 34.8 g/dL (ref 32.0–36.0)
MCV: 87.2 fL (ref 80.0–100.0)
MPV: 8.9 fL (ref 7.5–11.5)
Platelets: 152 10*3/uL (ref 140–400)
RBC: 5.19 10*6/uL (ref 4.20–5.80)
RDW: 16.4 % (ref 11.0–15.0)
WBC: 4 10*3/uL (ref 3.8–10.8)

## 2017-04-11 LAB — HIV-1 RNA, QUANTITATIVE, PCR: HIV 1 Copies: <20 copies/mL

## 2017-04-11 LAB — DIFFERENTIAL
Atypical Lymphocytes Relative: 1 % (ref 0.0–9.0)
Basophils Absolute: 0 /uL (ref 0–200)
Basophils Relative: 0 % (ref 0.0–1.0)
Eosinophils Absolute: 36 /uL (ref 15–500)
Eosinophils Relative: 0.9 % (ref 0.0–8.0)
Lymphocytes Absolute: 800 /uL — ABNORMAL LOW (ref 850–3900)
Lymphocytes Relative: 19 % (ref 15.0–45.0)
Monocytes Absolute: 228 /uL (ref 200–950)
Monocytes Relative: 5.7 % (ref 0.0–12.0)
Myelocytes Absolute: 40 /uL — ABNORMAL HIGH (ref 0–0)
Myelocytes Relative: 1 % — ABNORMAL HIGH (ref 0.0–0.0)
Neutrophils Absolute: 2896 /uL (ref 1500–7800)
Neutrophils Relative: 72.4 % (ref 40.0–80.0)
PLT Morphology: NORMAL
RBC Morphology: NORMAL

## 2017-04-11 MED ORDER — ondansetron (ZOFRAN) 4 MG tablet
4 | ORAL_TABLET | Freq: Three times a day (TID) | ORAL | 0 refills | Status: AC | PRN
Start: 2017-04-11 — End: 2017-07-25

## 2017-04-11 MED ORDER — thiamine HCl (VITAMIN B-1) 50 MG tablet
50 | ORAL_TABLET | Freq: Every day | ORAL | 3 refills | Status: AC
Start: 2017-04-11 — End: 2020-02-01

## 2017-04-11 MED ORDER — abacavir-dolutegravir-lamivud (TRIUMEQ) 600-50-300 mg Tab
600-50-300 | ORAL_TABLET | Freq: Every day | ORAL | 11 refills | Status: AC
Start: 2017-04-11 — End: 2017-08-11

## 2017-04-11 MED ORDER — famotidine (PEPCID) 20 MG tablet
20 | ORAL_TABLET | Freq: Two times a day (BID) | ORAL | 1 refills | Status: AC
Start: 2017-04-11 — End: 2017-06-06

## 2017-04-11 NOTE — Unmapped (Signed)
Alexander [name] is a 31 y.o. male with a history of IDU, hepatitis C infection, & recently-diagnosed HIV infection who presents to the [facility] to establish ongoing care for HIV.    Chief Complaint: HIV  No chief complaint on file.      HPI:  Alexander [name] is a 31 y.o. male diagnosed with HIV on 02/05/17. His initial IDC visit was on 02/20/17. He has not been diagnosed with AIDS. His CD4 nadir was 218 on 02/20/17. His self reported risk behavior for acquiring HIV was IDU. He has not experienced prior antiretroviral treatment failure.     His last visit to see me was on 03/07/17. His last CD4 was 218.0(22.5%) on 02/20/2017 and last viral load was 126,434 on 02/20/2017.    Today, we reviewed his medication adherence, and he reported no missed doses in the last 7 days. He reports the following problems with their medications : nausea & occasional vomiting for the 1st week or so.    Alexander [name] reports that he missed a couple of doses of Triumeq during the 1st week that he was taking it but denies missing any doses during the past week. He reports some nausea from the medication when he first started taking it, but he reports that this started getting a bit better after the 1st week on medication. He has not vomited for the past couple of weeks now. 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 He denies further IDU, but does report drinking multiple large cans of malt liquor daily. He reports continued nausea as well as saying he shakes all the time. He reports that he wants to quit drinking and is intending to reduce his alcohol intake over time so as to avoid withdrawal symptoms.      ROS:  Positives appear in bold; otherwise listed symptoms are negative.  General: fever, chills  Cardio: chest pain, palpitations  Pulm: cough, shortness of breath  GI: nausea,  diarrhea  Skin: rash, itching     Social history reviewed with patient and updated as appropriate in the electronic medical record.    Physical Exam:  Blood pressure (!) 144/99, pulse 113, temperature 98 ??F (36.7 ??C), temperature source Temporal, resp. rate 16, weight 146 lb (66.2 kg), SpO2 98 %.   General: Nontoxic-appearing male in NAD.  HEENT: PERRL / EOMI. Sclerae anicteric. Mucous membranes moist. Oropharynx clear without lesions.  Neck: No cervical / supraclavicular LAD.   CV/Chest: RRR with no r/g/m. S1 & S2 present. No peripheral edema.  Pulm: CTAB without w/r/r. Normal effort.  Abd: Soft, non-tender, non-distended. Bowel sounds present.   MSK: Able to move all 4 extremities. No joint effusions noted.  Skin: Warm & dry. No rash or skin lesions noted.  Neuro: Sensation grossly intact to light touch throughout. Mild tremor visible when pt holds out his hand.  Psych: A&Ox3. Normal affect.    Current Outpatient Prescriptions   Medication Sig   ??? abacavir-dolutegravir-lamivud Take 1 tablet by mouth daily.   ??? buprenorphine-naloxone Place 2 tablets under the tongue daily.   ??? ketoconazole Apply topically daily.     No current facility-administered medications for this visit.        Data Review:  Lab Results   Component Value Date    HGB 12.8 (L) 02/20/2017  HCT 37.3 (L) 02/20/2017    WBC 3.0 (L) 02/20/2017    PLT 45 (L) 02/20/2017     Lab Results   Component Value Date    NEUTOPHILPCT 51.0 02/20/2017    LYMPHOPCT 36.5 02/20/2017    MONOPCT 11.2 02/20/2017    EOSPCT 0.1 02/20/2017    BASOPCT 1.2 (H) 02/20/2017    NEUTROABS 1,530 02/20/2017    LYMPHSABS 1,095 02/20/2017    MONOSABS 336 02/20/2017    EOSABS 3 (L) 02/20/2017    BASOSABS 36 02/20/2017     Lab Results   Component Value Date    NA 142 02/20/2017    K 4.5 02/20/2017    CL 107 02/20/2017    CO2 26 02/20/2017    GLUCOSE 101 (H) 02/20/2017    BUN 13 02/20/2017    CREATININE 0.77 02/20/2017    MG 2.0 02/05/2017    PHOS 3.6 02/20/2017     Lab Results    Component Value Date    ALKPHOS 64 02/20/2017    AST 33 02/20/2017    ALT 27 02/20/2017    ALBUMIN 4.1 02/20/2017    ALBUMIN 4.1 02/20/2017    BILIDIRECT 0.08 02/20/2017    BILITOT 0.3 02/20/2017    PROT 6.5 02/20/2017       Labs reviewed as above.    Health maintenance and screening:  Lab Results   Component Value Date    CHOLTOT 118 02/20/2017    TRIG 110 02/20/2017    HDL 35 (L) 02/20/2017    LDL 61 02/20/2017       HBA1c:   Lab Results   Component Value Date    HGBA1C 5.0 11/14/2013     ASCVD risk: The ASCVD Risk score Denman George DC Jr., et al., 2013) failed to calculate for the following reasons:    The 2013 ASCVD risk score is only valid for ages 73 to 65    Vitamin D: No results found for: VITD25H  DEXA: none    Hepatitis A screening:   Lab Results   Component Value Date    HEPAIGG Nonreactive 12/12/2013     Hepatitis B screening:   Lab Results   Component Value Date    HEPBSAG Nonreactive 02/20/2017    HEPBCAB Nonreactive 02/20/2017    HBSAB Reactive (A) 02/20/2017     Hepatitis C screening:   Lab Results   Component Value Date    HCVAB Reactive (A) 02/20/2017    IU 1,610,960 12/12/2013        TB screening:   Lab Results   Component Value Date    QUANTIFERTB NEGATIVE 02/20/2017     Syphilis screening:   Lab Results   Component Value Date    TREPIA Negative 02/20/2017    RPRMONSCRN Non-Reactive 11/15/2013     GC screening:   Lab Results   Component Value Date    [facility] Negative 02/20/2017       Anal cancer screening:   Colon cancer screening: due at age 94    HLA B5701:   Lab Results   Component Value Date    HLAB5701 Negative 02/20/2017       Immunizations:  Influenza: next due fall 2018  PCV: given (03/2017)  PPSV: next due 12/2018  Tdap: next due 08/2025  Hep A: due  Hep B: immune  Immunization History   Administered Date(s) Administered   ??? Hep B 3 dose 10/03/2000, 07/26/2001, 01/30/2004, 03/02/2004   ??? Influenza Preservative Free 12/11/2013   ???  MMR 10/03/2000   ??? Pneumococcal Conjugate: PCV-13  03/07/2017   ??? Pneumococcal Polysaccharide 12/11/2013   ??? Td 01/30/2004   ??? tdap 08/07/2015       Assessment and Plan:  HIV infection (CMS Dx)  Started Triumeq about 1 month ago and is doing fairly well with this. Some issues with nausea that may be r/t Triumeq but that are likely compounded by issues w/ heavy alcohol intake & poor nutrition. Pt reporting good adherence to Triumeq, though.  - continue Triumeq  - restaging / med monitoring labs today  - discussed medication adherence; pt reports doing well with this for at least the past 2 weeks    Alcohol abuse  Pt reports heavy alcohol use for the past month or so; drinking multiple large cans of malt liquor daily. He also states that he is not eating right and has lost 10 lbs in the past month. He expresses his desire to quit drinking and is trying to reduce his intake over time to avoid withdrawal symptoms. Pt c/o nausea that is likely multifactorial but that is likely related at least in part to his alcohol use.  - start daily thiamine supplement  - trial of famotidine BID   - Zofran 4mg  prn q8h for relief of nausea when more severe      Return to [facility] in 4 weeks.    Reno Clasby ALLEN Jet Traynham

## 2017-04-11 NOTE — Assessment & Plan Note (Addendum)
Started Triumeq about 1 month ago and is doing fairly well with this. Some issues with nausea that may be r/t Triumeq but that are likely compounded by issues w/ heavy alcohol intake & poor nutrition. Pt reporting good adherence to Triumeq, though.  - continue Triumeq  - restaging / med monitoring labs today  - discussed medication adherence; pt reports doing well with this for at least the past 2 weeks

## 2017-04-11 NOTE — Unmapped (Signed)
Please keep taking the Triumeq like you are doing -- it sounds like this is going well! I sent in more refills for this.     I am sending in prescriptions for your stomach as well: one is a vitamin B pill (thiamine) that you should take once a day; this may help with shaking and may help with your stomach issues too. Another is an acid medicine called famotidine -- please take this once or twice a day because it should help your stomach feel a little better overall. The last one, Zofran, is a medicine to help directly with nausea; you can't take it all the time, but it is probably the best at getting nausea to go away for awhile.    Please come back to the [facility] in about 4 weeks for your next visit. If you have any questions, problems, or concerns before then, please give me a call!

## 2017-04-11 NOTE — Unmapped (Signed)
Pt reports heavy alcohol use for the past month or so; drinking multiple large cans of malt liquor daily. He also states that he is not eating right and has lost 10 lbs in the past month. He expresses his desire to quit drinking and is trying to reduce his intake over time to avoid withdrawal symptoms. Pt c/o nausea that is likely multifactorial but that is likely related at least in part to his alcohol use.  - start daily thiamine supplement  - trial of famotidine BID   - Zofran 4mg  prn q8h for relief of nausea when more severe

## 2017-05-15 NOTE — Unmapped (Signed)
Reminder call for 05/16/2017 est appointment with Dr. Manson Passey.   Message left with CM, Rogelia Rohrer asking patient to arrive 20 minutes early to update information at ground floor registration with photo id, insurance or financial information. To cancel or reschedule call [phone] and select option 1.

## 2017-05-16 ENCOUNTER — Other Ambulatory Visit: Admit: 2017-05-16 | Payer: PRIVATE HEALTH INSURANCE

## 2017-05-16 ENCOUNTER — Ambulatory Visit: Admit: 2017-05-16 | Discharge: 2017-05-16 | Payer: PRIVATE HEALTH INSURANCE

## 2017-05-16 DIAGNOSIS — B182 Chronic viral hepatitis C: Secondary | ICD-10-CM

## 2017-05-16 LAB — DIFFERENTIAL
Basophils Absolute: 39 /uL (ref 0–200)
Basophils Relative: 0.7 % (ref 0.0–1.0)
Eosinophils Absolute: 94 /uL (ref 15–500)
Eosinophils Relative: 1.7 % (ref 0.0–8.0)
Lymphocytes Absolute: 1243 /uL (ref 850–3900)
Lymphocytes Relative: 22.6 % (ref 15.0–45.0)
Monocytes Absolute: 567 /uL (ref 200–950)
Monocytes Relative: 10.3 % (ref 0.0–12.0)
Neutrophils Absolute: 3559 /uL (ref 1500–7800)
Neutrophils Relative: 64.7 % (ref 40.0–80.0)
nRBC: 0 /100{WBCs} (ref 0–0)

## 2017-05-16 LAB — CBC
Hematocrit: 47.7 % (ref 38.5–50.0)
Hemoglobin: 15.8 g/dL (ref 13.2–17.1)
MCH: 30 pg (ref 27.0–33.0)
MCHC: 33.2 g/dL (ref 32.0–36.0)
MCV: 90.2 fL (ref 80.0–100.0)
MPV: 9.1 fL (ref 7.5–11.5)
Platelets: 234 10*3/uL (ref 140–400)
RBC: 5.29 10*6/uL (ref 4.20–5.80)
RDW: 15.5 % — ABNORMAL HIGH (ref 11.0–15.0)
WBC: 5.5 10*3/uL (ref 3.8–10.8)

## 2017-05-16 LAB — RENAL FUNCTION PANEL W/EGFR
Albumin: 4.7 g/dL (ref 3.5–5.7)
Anion Gap: 9 mmol/L (ref 3–16)
BUN: 8 mg/dL (ref 7–25)
CO2: 28 mmol/L (ref 21–33)
Calcium: 10.1 mg/dL (ref 8.6–10.3)
Chloride: 102 mmol/L (ref 98–110)
Creatinine: 0.81 mg/dL (ref 0.60–1.30)
Glucose: 88 mg/dL (ref 70–100)
Osmolality, Calculated: 286 mosm/kg (ref 278–305)
Phosphorus: 3.7 mg/dL (ref 2.1–4.7)
Potassium: 4.9 mmol/L (ref 3.5–5.3)
Sodium: 139 mmol/L (ref 133–146)
eGFR AA CKD-EPI: >90 See note.
eGFR NONAA CKD-EPI: >90 See note.

## 2017-05-16 LAB — HEPATIC FUNCTION PANEL
ALT: 312 U/L (ref 7–52)
AST: 205 U/L (ref 13–39)
Albumin: 4.7 g/dL (ref 3.5–5.7)
Alkaline Phosphatase: 82 U/L (ref 36–125)
Bilirubin, Direct: 0.16 mg/dL (ref 0.00–0.40)
Bilirubin, Indirect: 0.54 mg/dL (ref 0.00–1.10)
Total Bilirubin: 0.7 mg/dL (ref 0.0–1.5)
Total Protein: 7.7 g/dL (ref 6.4–8.9)

## 2017-05-16 LAB — HEPATITIS C RNA, QUANTITATIVE PCR
IU log10: 6.52 log 10 IU/mL
International Units: 3322731 IU/mL

## 2017-05-16 MED ORDER — food supplemt, lactose-reduced (ENSURE) Liqd
Freq: Every day | ORAL | 3 refills | Status: AC
Start: 2017-05-16 — End: 2017-07-25

## 2017-05-16 NOTE — Unmapped (Signed)
Alexander [name] is a 31 y.o. male with a history of IDU, hepatitis C infection, & recently-diagnosed HIV infection who presents to the [facility] to establish ongoing care for HIV.    Chief Complaint: HIV  Chief Complaint   Patient presents with   ??? HIV Positive/AIDS       HPI:  Alexander [name] is a 31 y.o. male diagnosed with HIV on 02/05/17. His initial IDC visit was on 02/20/17. He has not been diagnosed with AIDS. His CD4 nadir was 218 on 02/20/17. His self reported risk behavior for acquiring HIV was IDU. He has not experienced prior antiretroviral treatment failure.     His last visit to see me was on 04/11/17. His last CD4 was 342.0(34.9%) on 04/11/2017 and last viral load was <20 on 04/11/2017.    Today, we reviewed his medication adherence, and he reported no missed doses in the last 7 days. (Reports 2 missed doses in the past month.) He reports the following problems with their medications: vomiting, only during last week in the morning, though also w/ recent acute illness.      Alexander [name] reports that his stomach is feeling somewhat better and he is eating a bit better. He reports that he has significantly cut down on his alcohol use and is now drinking about 2 24oz cans of beer roughly every other day -- this is a major decrease from his previous daily intake of malt liquor. He was having issues with cough and later SOB about 2 weeks ago, but this now seems to be getting better; he denies cough at this point.    He continues to complain of poor energy and feeling weak much of the time. 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 He denies any recent acute illness.    ROS:  Positives appear in bold; otherwise listed symptoms are negative.  General: fever, chills  Cardio: chest pain, palpitations  Pulm: cough, shortness of breath  GI: nausea, diarrhea  Skin: rash, itching     Social history reviewed with patient and updated as appropriate in the electronic medical record.    Physical  Exam:  Blood pressure 130/76, pulse 65, temperature 97.7 ??F (36.5 ??C), temperature source Oral, resp. rate 16, height 5' 9 (1.753 m), weight 148 lb (67.1 kg), SpO2 100 %.   General: Slightly fatigued-appearing male in NAD.  HEENT: PERRL / EOMI. Sclerae anicteric. Mucous membranes moist. Oropharynx clear without lesions.  Neck: No cervical / supraclavicular LAD.   CV/Chest: RRR with no r/g/m. S1 & S2 present. No peripheral edema.  Pulm: CTAB without w/r/r. Normal effort.  Abd: Soft, non-tender, non-distended. Bowel sounds present.   MSK: Able to move all 4 extremities. No joint effusions noted.  Skin: Warm & dry. No rash or skin lesions noted.  Neuro: Sensation grossly intact to light touch throughout. No significant tremor noted.  Psych: A&Ox3. Normal affect.    Current Outpatient Prescriptions   Medication Sig   ??? abacavir-dolutegravir-lamivud Take 1 tablet by mouth daily.   ??? buprenorphine-naloxone Place 2 tablets under the tongue daily.   ??? famotidine Take 1 tablet (20 mg total) by mouth 2 times a day.   ??? ketoconazole Apply topically daily.   ??? lisinopril 5 mg.   ??? melatonin    ??? ondansetron Take 1 tablet (4 mg total) by mouth every 8 hours as needed for Nausea.   ??? thiamine HCl Take 1 tablet (50 mg total) by mouth  daily.     No current facility-administered medications for this visit.        Data Review:  Lab Results   Component Value Date    HGB 15.8 04/11/2017    HCT 45.3 04/11/2017    WBC 4.0 04/11/2017    PLT 152 04/11/2017     Lab Results   Component Value Date    NEUTOPHILPCT 72.4 04/11/2017    LYMPHOPCT 19.0 04/11/2017    MONOPCT 5.7 04/11/2017    EOSPCT 0.9 04/11/2017    BASOPCT 0.0 04/11/2017    NEUTROABS 2,896 04/11/2017    LYMPHSABS 800 (L) 04/11/2017    MONOSABS 228 04/11/2017    EOSABS 36 04/11/2017    BASOSABS 0 04/11/2017     Lab Results   Component Value Date    NA 144 04/11/2017    K 3.8 04/11/2017    CL 108 04/11/2017    CO2 25 04/11/2017    GLUCOSE 84 04/11/2017    BUN 8 04/11/2017     CREATININE 0.83 04/11/2017    MG 2.0 02/05/2017    PHOS 3.6 04/11/2017     Lab Results   Component Value Date    ALKPHOS 90 04/11/2017    AST 327 (H) 04/11/2017    ALT 272 (H) 04/11/2017    ALBUMIN 4.7 04/11/2017    ALBUMIN 4.7 04/11/2017    BILIDIRECT 0.10 04/11/2017    BILITOT 0.4 04/11/2017    PROT 7.8 04/11/2017       Labs reviewed as above.    Health maintenance and screening:  Lab Results   Component Value Date    CHOLTOT 118 02/20/2017    TRIG 110 02/20/2017    HDL 35 (L) 02/20/2017    LDL 61 02/20/2017       HBA1c:   Lab Results   Component Value Date    HGBA1C 5.0 11/14/2013     ASCVD risk: The ASCVD Risk score Denman George DC Jr., et al., 2013) failed to calculate for the following reasons:    The 2013 ASCVD risk score is only valid for ages 22 to 75    Vitamin D: No results found for: VITD25H  DEXA: none    Hepatitis A screening:   Lab Results   Component Value Date    HEPAIGG Nonreactive 12/12/2013     Hepatitis B screening:   Lab Results   Component Value Date    HEPBSAG Nonreactive 02/20/2017    HEPBCAB Nonreactive 02/20/2017    HBSAB Reactive (A) 02/20/2017     Hepatitis C screening:   Lab Results   Component Value Date    HCVAB Reactive (A) 02/20/2017    IU 5,784,696 12/12/2013        TB screening:   Lab Results   Component Value Date    QUANTIFERTB NEGATIVE 02/20/2017     Syphilis screening:   Lab Results   Component Value Date    TREPIA Negative 02/20/2017    RPRMONSCRN Non-Reactive 11/15/2013     GC screening:   Lab Results   Component Value Date    [facility] Negative 02/20/2017       Anal cancer screening:   Colon cancer screening: due at age 55    HLA B5701:   Lab Results   Component Value Date    HLAB5701 Negative 02/20/2017       Immunizations:  Influenza: next due fall 2018  PCV: given (03/2017)  PPSV: next due 12/2018  Tdap: next due 08/2025  Hep  A: due  Hep B: immune  Immunization History   Administered Date(s) Administered   ??? Hep B 3 dose 10/03/2000, 07/26/2001, 01/30/2004, 03/02/2004   ???  Influenza Preservative Free 12/11/2013   ??? MMR 10/03/2000   ??? Pneumococcal Conjugate: PCV-13 03/07/2017   ??? Pneumococcal Polysaccharide 12/11/2013   ??? Td 01/30/2004   ??? tdap 08/07/2015     Assessment and Plan:  HIV infection (CMS Dx)  Reports good adherence to Triumeq without side effects currently.  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 C/b ongoing alcohol use; however, pt has substantially decreased his alcohol consumption in the month since his last visit and reports his intention to quit drinking entirely.  - repeat HCV RNA today  - recheck LFTs today  - encouraged complete alcohol cessation  - obtain Fibroscan      Return to [facility] in 2 months.    Jere Vanburen ALLEN Santonio Speakman

## 2017-05-16 NOTE — Unmapped (Addendum)
Please stop by the lab for some blood tests.    Please call [phone] to schedule your Fibroscan -- this is a special type of ultrasound for your liver, and once this is done, we can work further on getting approval for medication to treat hepatitis C.     Please come back to the [facility] in about 2 months for your next visit.

## 2017-05-16 NOTE — Unmapped (Signed)
Pt tolerated well Hepatitis A immunization administered to lt deltoid. Pt will receive his 2nd Hep A immunization when he returns tio see Dr Lorretta Harp in August.

## 2017-05-17 NOTE — Unmapped (Signed)
Genotype 3. C/b ongoing alcohol use; however, pt has substantially decreased his alcohol consumption in the month since his last visit and reports his intention to quit drinking entirely.  - repeat HCV RNA today  - recheck LFTs today  - encouraged complete alcohol cessation  - obtain Fibroscan

## 2017-05-17 NOTE — Unmapped (Signed)
Reports good adherence to Triumeq without side effects currently.  - continue Triumeq  - restaging labs at next visit  - discussed adherence; pt is doing well    Preventative health care  - give HAV vaccine #1 today

## 2017-05-18 ENCOUNTER — Inpatient Hospital Stay

## 2017-06-06 MED ORDER — famotidinePEPCID20MGtablet
20 | ORAL_TABLET | ORAL | 0 refills | Status: AC
Start: 2017-06-06 — End: 2017-07-04

## 2017-06-23 ENCOUNTER — Inpatient Hospital Stay

## 2017-06-27 ENCOUNTER — Inpatient Hospital Stay
Admit: 2017-06-27 | Discharge: 2017-06-27 | Disposition: A | Discharge status: Home / Self Care | Payer: MEDICAID | Attending: Emergency Medicine

## 2017-06-27 DIAGNOSIS — Z760 Encounter for issue of repeat prescription: Secondary | ICD-10-CM

## 2017-06-27 MED ORDER — QUETIAPINE FUMARATE 100 MG PO TABS
100 MG | ORAL_TABLET | Freq: Every evening | ORAL | 0 refills | Status: AC
Start: 2017-06-27 — End: ?

## 2017-06-27 MED ORDER — BUPRENORPHINE HCL-NALOXONE HCL 2-0.5 MG SL SUBL
Freq: Once | SUBLINGUAL | Status: AC
Start: 2017-06-27 — End: 2017-06-27
  Administered 2017-06-27: 22:00:00 4 via SUBLINGUAL

## 2017-06-27 MED ORDER — PROMETHAZINE HCL 25 MG PO TABS
25 MG | Freq: Once | ORAL | Status: AC
Start: 2017-06-27 — End: 2017-06-27
  Administered 2017-06-27: 22:00:00 25 mg via ORAL

## 2017-06-27 MED ORDER — QUETIAPINE FUMARATE 25 MG PO TABS
25 MG | ORAL_TABLET | Freq: Every day | ORAL | 0 refills | Status: AC
Start: 2017-06-27 — End: ?

## 2017-06-27 MED ORDER — ABACAVIR-DOLUTEGRAVIR-LAMIVUD 600-50-300 MG PO TABS
600-50-300 MG | ORAL_TABLET | Freq: Every day | ORAL | 0 refills | Status: AC
Start: 2017-06-27 — End: 2017-07-11

## 2017-06-27 MED ORDER — LISINOPRIL 5 MG PO TABS
5 MG | ORAL_TABLET | Freq: Every day | ORAL | 0 refills | Status: AC
Start: 2017-06-27 — End: 2017-07-11

## 2017-06-27 MED FILL — BUPRENORPHINE HCL-NALOXONE HCL 2-0.5 MG SL SUBL: SUBLINGUAL | Qty: 4

## 2017-06-27 MED FILL — PROMETHAZINE HCL 25 MG PO TABS: 25 MG | ORAL | Qty: 1
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
## 2017-06-27 NOTE — ED Notes (Signed)
Cab voucher provided to patient upon discharge per patient request. Cab company called after verifying home address. Patient verbalizes understanding that cab will be dropping off at address provided and will not divert from address provided.        Dagoberto LigasJessica D Hoff, RN  06/27/17 [phone]

## 2017-06-27 NOTE — Discharge Instructions (Signed)
Follow up with your infectious disease doctor, Suboxone physician and your primary care provider.  Take medication as prescribed.  I will refill your medication for 14 days.  He will need to follow up with her other providers.  Return to ED worsening symptoms or any other concerns.

## 2017-06-27 NOTE — ED Notes (Signed)
Patient prepared for and ready to be discharged. Patient discharged at this time in no acute distress after verbalizing understanding of discharge instructions. Patient left after receiving After Visit Summary instructions.        Brett LigasJessica D Hoff, RN  06/27/17 [phone]

## 2017-06-27 NOTE — ED Notes (Signed)
Bed: B16-16  Expected date:   Expected time:   Means of arrival:   Comments:  squad     Clois DupesSammy Saysha Menta, RN  06/27/17 1712
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
## 2017-06-27 NOTE — ED Notes (Signed)
Pt suddenly became very upset and yelling at staff stating, "Hurry the fuck up." "this is fucking bullshit." This nurse and MD explained we were getting the medication he was requesting. Pt still upset and yelling that he needs suboxone and that he was upset we could not give him a prescription. MD explained that not all MDs can prescribe it, but we would get him what we could. Pt also worried about not getting to his MD appointments coming up. This nurse explained to patient that the insurance he has would provide him with a ride to his MD appointments. Pt stated, "I'm not calling them, I'll just walked even though its 7 miles. It's fine." Continued to educate patient. Pt finally calmed down and apologized for yelling.     Dagoberto LigasJessica D Hoff, RN  06/27/17 [phone]
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
## 2017-07-04 MED ORDER — famotidine (PEPCID) 20 MG tablet
20 | ORAL_TABLET | ORAL | 0 refills | Status: AC
Start: 2017-07-04 — End: 2017-07-25

## 2017-07-24 ENCOUNTER — Inpatient Hospital Stay
Admission: EM | Admit: 2017-07-25 | Discharge: 2017-07-25 | Disposition: A | Discharge status: Home / Self Care | Payer: PRIVATE HEALTH INSURANCE | Admitting: Psychiatry

## 2017-07-24 DIAGNOSIS — F319 Bipolar disorder, unspecified: Secondary | ICD-10-CM

## 2017-07-24 MED ORDER — LORazepam (ATIVAN) tablet 1 mg
1 | ORAL | Status: AC | PRN
Start: 2017-07-24 — End: 2017-07-25

## 2017-07-24 MED ORDER — folic acid (FOLVITE) tablet 1 mg
1 | Freq: Every day | ORAL | Status: AC
Start: 2017-07-24 — End: 2017-07-25
  Administered 2017-07-25: 13:00:00 1 mg via ORAL

## 2017-07-24 MED ORDER — ziprasidone (GEODON) 20 mg/mL (final conc.) injection
20 | INTRAMUSCULAR | Status: AC
Start: 2017-07-24 — End: 2017-07-24
  Administered 2017-07-24: 20 via INTRAMUSCULAR

## 2017-07-24 MED ORDER — LORazepam (ATIVAN) 2 mg/mL injection
2 | Freq: Once | INTRAMUSCULAR | Status: AC
Start: 2017-07-24 — End: 2017-07-24

## 2017-07-24 MED ORDER — ibuprofen (ADVIL,MOTRIN) tablet 400 mg
400 | Freq: Four times a day (QID) | ORAL | Status: AC | PRN
Start: 2017-07-24 — End: 2017-07-25

## 2017-07-24 MED ORDER — thiamine mononitrate (VITAMIN B-1) tablet 100 mg
100 | Freq: Every day | ORAL | Status: AC
Start: 2017-07-24 — End: 2017-07-25
  Administered 2017-07-25: 13:00:00 100 mg via ORAL

## 2017-07-24 MED ORDER — LORazepam (ATIVAN) tablet 0.5 mg
0.5 | ORAL | Status: AC | PRN
Start: 2017-07-24 — End: 2017-07-25

## 2017-07-24 MED ORDER — LORazepam (ATIVAN) 2 mg/mL injection
2 | INTRAMUSCULAR | Status: AC
Start: 2017-07-24 — End: 2017-07-24
  Administered 2017-07-24: 2 via INTRAMUSCULAR

## 2017-07-24 MED ORDER — ziprasidone (GEODON) injection 20 mg
20 | Freq: Once | INTRAMUSCULAR | Status: AC
Start: 2017-07-24 — End: 2017-07-24

## 2017-07-24 MED ORDER — nicotine (polacrilex) (NICORETTE/NICORELIEF) gum 2 mg
2 | BUCCAL | Status: AC | PRN
Start: 2017-07-24 — End: 2017-07-25

## 2017-07-24 MED ORDER — LORazepam (ATIVAN) tablet 2 mg
1 | ORAL | Status: AC | PRN
Start: 2017-07-24 — End: 2017-07-25

## 2017-07-24 MED ORDER — multivitamin with folic acid tablet 1 tablet
400 | Freq: Every day | ORAL | Status: AC
Start: 2017-07-24 — End: 2017-07-25
  Administered 2017-07-25: 13:00:00 1 via ORAL

## 2017-07-24 MED FILL — LORAZEPAM 2 MG/ML INJECTION SOLUTION: 2 2 mg/mL | INTRAMUSCULAR | Qty: 1

## 2017-07-24 MED FILL — GEODON 20 MG/ML (FINAL CONCENTRATION) INTRAMUSCULAR SOLUTION: 20 20 mg/mL (final conc.) | INTRAMUSCULAR | Qty: 20
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
## 2017-07-24 NOTE — Unmapped (Signed)
ILYAAS MUSTO  [id number]    Restraint and/or Seclusion Violent and Self-Destructive Assessment    Type of Assessment: Initial    Type of measure used? Restraint    What is patient's immediate situation? Labile, combative, belligerent, trying fight police and secuirty    What was patient's reaction to the intervention? laible    What is the patient's medical and behavioral condition? labile    Where less restrictive measures attempted? Yes but he is totally out of control    Is the need to continue or terminate the restraint or seclusion? restrant
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
## 2017-07-25 MED ORDER — QUEtiapine (SEROQUEL) 100 MG tablet
100 | ORAL_TABLET | Freq: Every evening | ORAL | 0 refills | Status: AC
Start: 2017-07-25 — End: 2018-07-25

## 2017-07-25 MED ORDER — buprenorphine HCl (SUBUTEX) SL tablet 4 mg
2 | Freq: Once | SUBLINGUAL | Status: AC
Start: 2017-07-25 — End: 2017-07-25
  Administered 2017-07-25: 13:00:00 4 mg via SUBLINGUAL

## 2017-07-25 MED ORDER — buprenorphine HCl (SUBUTEX) SL tablet 4 mg
2 | Freq: Four times a day (QID) | SUBLINGUAL | Status: AC | PRN
Start: 2017-07-25 — End: 2017-07-25

## 2017-07-25 MED ORDER — QUEtiapine (SEROQUEL) 25 MG tablet
25 | ORAL_TABLET | Freq: Two times a day (BID) | ORAL | 0 refills | Status: AC
Start: 2017-07-25 — End: 2018-07-25

## 2017-07-25 MED FILL — FOLIC ACID 1 MG TABLET: 1 1 MG | ORAL | Qty: 1

## 2017-07-25 MED FILL — VITAMIN B-1 (MONONITRATE) 100 MG TABLET: 100 100 mg | ORAL | Qty: 1

## 2017-07-25 MED FILL — THERA 400 MCG TABLET: 400 400 mcg | ORAL | Qty: 1

## 2017-07-25 MED FILL — BUPRENORPHINE HCL 2 MG SUBLINGUAL TABLET: 2 2 mg | SUBLINGUAL | Qty: 2

## 2017-07-25 NOTE — Unmapped (Signed)
(  D) Client was sleeping soundly.   Awoke for vital signs and CIWA.   Client calm and cooperative.   No signs nor symptoms of acute distress noted.   (A) 15 minute checks per policy. All safety measures appropriate for this patient in place. (R) safety maintained.

## 2017-07-25 NOTE — ED Notes (Signed)
(  D) Client woken for CIWA assessment.   Writer talked with the Client about his drinking and his depression and what barriers he has placed on himself that keep him from remaining sober , and moving on with his life.   Emotional support and encouragement provided.  (A) 15 minute checks per policy. All safety measures appropriate for this patient in place. (R) safety maintained.

## 2017-07-25 NOTE — ED Notes (Signed)
Patient is awake and cooperative with staff. He indicates that he does not know what he feels needs to happen with his case today. He denies that he is going through withdrawal at this time. He is focused on taking his morning medications.

## 2017-07-25 NOTE — Unmapped (Signed)
Patient was seen by the MD and able to be discharged. His personal belongings were returned and he prepared to leave. He took his morning medications prior to leaving. His discharge instructions were reviewed with the patient and a copy was given to him by the RN. He left OTA with a bus token to go home.

## 2017-07-25 NOTE — Unmapped (Signed)
(  D) Client continues to sleep at end of shift with no signs nor symptoms of acute distress noted. (A) 15 minutes checks continued. All safety measures appropriate for this client in place. (R) Safety maintained.

## 2017-07-25 NOTE — Unmapped (Signed)
[facility] PES   Observation Disposition Note / Summary    Date of placement in PES Observation:  Last order of PLACE IN ED OBSERVATION was found on 07/24/2017 from Hospital Encounter on 07/24/2017       Subjective     Alexander [name] has undergone comprehensive psychiatric evaluation and therapeutic management in accordance with the PES guidelines for SIMD/SI/intox. Based on his clinical response and information obtained during this period of observation, it has been determined that he will be discharged home.       Examination      Vital signs: stable    Mental Status Exam:     Gait and Muscle Strength:  Normal  Appearance and Behavior: Calm and Cooperative      Groomed  Speech: NL articulation, prosody, volume and production  Mood: appropriate  Affect: appropriate  Thought Process and Associations: goal directed and no derailment       No loose associations  Thought Content: no si/hi or plan at time of dc. Future orienteed, asking for suboxone, making plans for f/u with [facility]  Perceptual Abnormalities: None  Orientation: person, place, time/date and situation  Memory/Attention: recent, remote, and immediate recall intact  Abstraction: Abstraction normal  Fund of Knowledge: average  Insight and Judgement: Partial     Fair       Impression and Plan      Alexander [name] has been cared for according to the standard PES observation protocol for SIMD. This extended period of observation was specifically required to determine the need for hospitalization. Prior to discharge from observation, the final exam is documented above.      After this period of observation, the patient is clinically sober and has a stable mental status.    Based on the patient's condition and test results, the patient will dc home. Pt was observed overnight form etoh intox, SIMD. Pt has been off his suboxone x 1 mo, had withdrawls, began using again. He made phone calls to gwet reconnected for suboxone at [facility] and has appt tomorrow morning.  Gave him 4 subox today (1/4 home does) to carry him over. He is future oriented and making plans, caring for himself, taking his home HIV meds. Lot of stress lately but sober and doing much better this morning. Will dc home     The total length of observation was 14 hours.    The patient will follow-up with [facility] and [facility] as appropriate. Please see the AVS for comprehensive discharge instructions.

## 2017-07-25 NOTE — ED Notes (Signed)
Social Work Note:    SW called pt's mother Alexander [name] [phone].  This number is non working.      SW called pt's sister Alexander [name] [phone].  Left message.    SW called [facility] CM [name] [phone].  Left message.

## 2017-07-25 NOTE — ED Notes (Signed)
SW Note: SW contacted CD SW for consult.    SW will continue to monitor.
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
## 2017-07-25 NOTE — ED Notes (Signed)
(  D) Client woken and transferred to bed # 5.   Client drifted back to sleep almost immediately.   No signs nor symptoms of acute distress noted.   (A) 15 minute checks per policy. All safety measures appropriate for this patient in place. (R) safety maintained.    Marland Kitchen

## 2017-08-11 ENCOUNTER — Ambulatory Visit: Admit: 2017-08-11 | Discharge: 2017-08-11 | Payer: PRIVATE HEALTH INSURANCE

## 2017-08-11 DIAGNOSIS — B20 Human immunodeficiency virus [HIV] disease: Secondary | ICD-10-CM

## 2017-08-11 MED ORDER — abacavir-dolutegravir-lamivud (TRIUMEQ) 600-50-300 mg Tab
600-50-300 | ORAL_TABLET | Freq: Every day | ORAL | 11 refills | Status: AC
Start: 2017-08-11 — End: 2018-06-12

## 2017-08-11 NOTE — Unmapped (Signed)
I sent refills for your Triumeq to [facility] -- they should start sending your medicine out to you, probably by next week. If it's been a few days and you haven't got your medicine yet, please give me a call!     I'll send a message to Jeanette Caprice, our counselor here at [facility]. She should be giving you a call by sometime next week to talk about getting you set up with mental health services here at [facility].    Please come back to the [facility] in about a month for your next visit. If you have any questions, problems, or concerns before then, please give me a call!

## 2017-08-11 NOTE — Progress Notes (Signed)
Pt. Received immunization today, he tolerated it well.

## 2017-08-11 NOTE — Unmapped (Signed)
Pt reporting depression and longstanding high stress levels. His mental health issues are c/b polysubstance use (alcohol & heroin). Was recently in PES w/ intoxication & SI; pt denies SI at this time. He is amenable to following with mental health services here at [facility].  - refer to Jeanette Caprice for mental health f/u

## 2017-08-11 NOTE — Assessment & Plan Note (Signed)
Ran out of medication 1 week ago d/t insurance issues. His case manager states that his medication needs to be sent [facility]. The pt denies acute illness but does endorse ongoing heavy alcohol use as well as recent relapse into IDU (heroin).  - resume Triumeq ([facility])  - defer restaging labs until next visit as pt has been off meds  - discussed adherence; pt has had some lapses, most recently d/t insurance problem    Preventative health care  - give MenACWY today
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
## 2017-08-14 NOTE — Telephone Encounter (Signed)
Telephoned Alexander [name] case manager today per his chart indication for appointment scheduling per his referral from Dr. Marlene Bast for mental health counseling.  Left vm with pt's case manager Wendi Maya, [place] to assist pt in returning my call to discuss referral and scheduling.

## 2017-08-14 NOTE — Unmapped (Signed)
-----   Message from Douglas Allen Brown, MD sent at 08/11/2017 10:48 AM EDT -----  Regarding: referral  Hi Suzie,    Let me tell you about a patient of mine who could use some mental health assistance. Alexander [name] is a 31yo M with alcohol & heroin use problems; he had been off heroin for some months but recently relapsed after he apparently had an insurance issue and went off suboxone. He reports longstanding levels of high stress and depressed mood. He was in PES fairly recently w/ acute intoxication & SI but denies SI today. He expresses interest in following with mental health here. He has been doing fairly well in terms of his HIV on Triumeq, though his antiretroviral meds were also interrupted by his recent insurance issues. He does have a case manager who has been coming to his appointments with him.    Thanks for your help,  Doug

## 2017-08-15 NOTE — Telephone Encounter (Signed)
Received cm from pt's [place] Case Manager requesting I attempt to reach MR. Alexander [name] at [phone].  I attempted to do so and reached vm.  Left discreet message requesting he return call to discuss referral via Dr. Manson Passey.

## 2017-08-15 NOTE — Unmapped (Signed)
-----   Message from Curly Rim, MD sent at 08/11/2017 10:48 AM EDT -----  Regarding: referral  Hi Suzie,    Let me tell you about a patient of mine who could use some mental health assistance. Mr Schroeter is a 31yo M with alcohol & heroin use problems; he had been off heroin for some months but recently relapsed after he apparently had an insurance issue and went off suboxone. He reports longstanding levels of high stress and depressed mood. He was in PES fairly recently w/ acute intoxication & SI but denies SI today. He expresses interest in following with mental health here. He has been doing fairly well in terms of his HIV on Triumeq, though his antiretroviral meds were also interrupted by his recent insurance issues. He does have a case manager who has been coming to his appointments with him.    Thanks for your help,  Gala Romney

## 2017-09-14 NOTE — Unmapped (Signed)
Reminder call for 09/15/2017 appointment with Dr. Manson Passey.       Message left with case manager Wendi Maya as requested, asking patient to arrive 20 minutes early to update information at ground floor registration with photo id, insurance or financial information. To cancel or reschedule call [phone] and select option 1.

## 2017-09-15 ENCOUNTER — Encounter

## 2017-09-15 ENCOUNTER — Other Ambulatory Visit: Admit: 2017-09-15 | Payer: PRIVATE HEALTH INSURANCE

## 2017-09-15 DIAGNOSIS — B20 Human immunodeficiency virus [HIV] disease: Secondary | ICD-10-CM

## 2017-09-15 LAB — HEPATIC FUNCTION PANEL, SERUM
ALT: 198 U/L (ref 7–52)
AST (SGOT): 161 U/L (ref 13–39)
Albumin: 4.6 g/dL (ref 3.5–5.7)
Alkaline Phosphatase: 71 U/L (ref 36–125)
Bilirubin, Direct: 0.09 mg/dL (ref 0.00–0.40)
Bilirubin, Indirect: 0.31 mg/dL (ref 0.00–1.10)
Total Bilirubin: 0.4 mg/dL (ref 0.0–1.5)
Total Protein: 7.3 g/dL (ref 6.4–8.9)

## 2017-09-15 LAB — LYMPHOCYTE SUBSET FOLLOWUP
% CD 3 Pos. Lymph.: 83.5 % (ref 56.0–89.0)
% CD 4 Pos. Lymph.: 44.9 % (ref 30.0–64.0)
% CD 8 Pos. Lymph.: 37.9 % (ref 6.0–43.0)
Absolute CD 3: 998 Cells/uL (ref 870–2532)
Absolute CD 4 Helper: 509 Cells/uL (ref 255–2496)
Absolute CD 8 (Supp): 430 Cells/uL (ref 155–1010)
CD19 % B Cell: 12.7 % (ref 1.0–28.0)
CD19 Abs: 159 Cells/uL (ref 24–683)
CD4/CD8 Ratio: 1.18 ratio (ref 1.10–3.70)

## 2017-09-15 LAB — RENAL FUNCTION PANEL W/EGFR
Albumin: 4.5 g/dL (ref 3.5–5.7)
Anion Gap: 9 mmol/L (ref 3–16)
BUN: 13 mg/dL (ref 7–25)
CO2: 27 mmol/L (ref 21–33)
Calcium: 9.3 mg/dL (ref 8.6–10.3)
Chloride: 104 mmol/L (ref 98–110)
Creatinine: 0.76 mg/dL (ref 0.60–1.30)
Glucose: 83 mg/dL (ref 70–100)
Osmolality, Calculated: 289 mOsm/kg (ref 278–305)
Phosphorus: 4.9 mg/dL (ref 2.1–4.7)
Potassium: 4.9 mmol/L (ref 3.5–5.3)
Sodium: 140 mmol/L (ref 133–146)
eGFR AA CKD-EPI: >90 See note.
eGFR NONAA CKD-EPI: >90 See note.

## 2017-09-15 LAB — CBC
Hematocrit: 44.3 % (ref 38.5–50.0)
Hemoglobin: 15.2 g/dL (ref 13.2–17.1)
MCH: 30.9 pg (ref 27.0–33.0)
MCHC: 34.5 g/dL (ref 32.0–36.0)
MCV: 89.7 fL (ref 80.0–100.0)
MPV: 9.2 fL (ref 7.5–11.5)
Platelets: 210 10*3/uL (ref 140–400)
RBC: 4.94 10*6/uL (ref 4.20–5.80)
RDW: 13 % (ref 11.0–15.0)
WBC: 4.5 10*3/uL (ref 3.8–10.8)

## 2017-09-15 LAB — HIV-1 RNA, QUANTITATIVE, PCR
HIV 1 Copies: 29 copies/mL
HIV log10copies: 1.462 log10copy/mL

## 2017-09-15 LAB — DIFFERENTIAL
Basophils Absolute: 45 /uL (ref 0–200)
Basophils Relative: 1 % (ref 0.0–1.0)
Eosinophils Absolute: 347 /uL (ref 15–500)
Eosinophils Relative: 7.7 % (ref 0.0–8.0)
Lymphocytes Absolute: 1247 /uL (ref 850–3900)
Lymphocytes Relative: 27.7 % (ref 15.0–45.0)
Monocytes Absolute: 567 /uL (ref 200–950)
Monocytes Relative: 12.6 % (ref 0.0–12.0)
Neutrophils Absolute: 2295 /uL (ref 1500–7800)
Neutrophils Relative: 51 % (ref 40.0–80.0)
nRBC: 0 /100 WBC (ref 0–0)

## 2017-09-21 NOTE — Unmapped (Signed)
Pt has appt scheduled for 10/17/17 with Dr. Manson Passey

## 2017-10-04 NOTE — Unmapped (Signed)
Returned call and spoke w/ Dr Lawerance Sabal (pt's PCP). Discussed his LFTs recently rising somewhat; may be d/t HCV. She reports that he has been working to remain off illicit drugs. Will continue to monitor LFTs at his visits w/ Dr Lawerance Sabal and his upcoming visit w/ me in 2 weeks.

## 2017-10-04 NOTE — Unmapped (Signed)
Dr. Anice Paganini requesting a returned ph call from MD to discuss pt's lab results and medication management. Pls follow up with MD at [phone]

## 2017-10-17 ENCOUNTER — Other Ambulatory Visit: Admit: 2017-10-17 | Payer: PRIVATE HEALTH INSURANCE

## 2017-10-17 ENCOUNTER — Ambulatory Visit: Admit: 2017-10-17 | Discharge: 2017-10-17 | Payer: PRIVATE HEALTH INSURANCE

## 2017-10-17 DIAGNOSIS — B20 Human immunodeficiency virus [HIV] disease: Secondary | ICD-10-CM

## 2017-10-17 LAB — RENAL FUNCTION PANEL W/EGFR
Albumin: 4 g/dL (ref 3.5–5.7)
Anion Gap: 7 mmol/L (ref 3–16)
BUN: 12 mg/dL (ref 7–25)
CO2: 28 mmol/L (ref 21–33)
Calcium: 9.1 mg/dL (ref 8.6–10.3)
Chloride: 105 mmol/L (ref 98–110)
Creatinine: 0.79 mg/dL (ref 0.60–1.30)
Glucose: 98 mg/dL (ref 70–100)
Osmolality, Calculated: 290 mOsm/kg (ref 278–305)
Phosphorus: 4.5 mg/dL (ref 2.1–4.7)
Potassium: 4.4 mmol/L (ref 3.5–5.3)
Sodium: 140 mmol/L (ref 133–146)
eGFR AA CKD-EPI: >90 See note.
eGFR NONAA CKD-EPI: >90 See note.

## 2017-10-17 LAB — DIFFERENTIAL
Basophils Absolute: 73 /uL (ref 0–200)
Basophils Relative: 1.4 % (ref 0.0–1.0)
Eosinophils Absolute: 364 /uL (ref 15–500)
Eosinophils Relative: 7 % (ref 0.0–8.0)
Lymphocytes Absolute: 1446 /uL (ref 850–3900)
Lymphocytes Relative: 27.8 % (ref 15.0–45.0)
Monocytes Absolute: 588 /uL (ref 200–950)
Monocytes Relative: 11.3 % (ref 0.0–12.0)
Neutrophils Absolute: 2730 /uL (ref 1500–7800)
Neutrophils Relative: 52.5 % (ref 40.0–80.0)
nRBC: 0 /100 WBC (ref 0–0)

## 2017-10-17 LAB — CBC
Hematocrit: 42.9 % (ref 38.5–50.0)
Hemoglobin: 15 g/dL (ref 13.2–17.1)
MCH: 31.4 pg (ref 27.0–33.0)
MCHC: 35 g/dL (ref 32.0–36.0)
MCV: 89.5 fL (ref 80.0–100.0)
MPV: 9.2 fL (ref 7.5–11.5)
Platelets: 182 10*3/uL (ref 140–400)
RBC: 4.79 10*6/uL (ref 4.20–5.80)
RDW: 13.4 % (ref 11.0–15.0)
WBC: 5.2 10*3/uL (ref 3.8–10.8)

## 2017-10-17 LAB — HEPATIC FUNCTION PANEL
ALT: 253 U/L (ref 7–52)
AST: 128 U/L (ref 13–39)
Albumin: 4 g/dL (ref 3.5–5.7)
Alkaline Phosphatase: 75 U/L (ref 36–125)
Bilirubin, Direct: 0.11 mg/dL (ref 0.00–0.40)
Bilirubin, Indirect: 0.19 mg/dL (ref 0.00–1.10)
Total Bilirubin: 0.3 mg/dL (ref 0.0–1.5)
Total Protein: 7.1 g/dL (ref 6.4–8.9)

## 2017-10-17 LAB — VITAMIN B1, WHOLE BLOOD: Vit. B1, Whole Blood: 162.6 nmol/L (ref 66.5–200.0)

## 2017-10-17 LAB — VITAMIN B12: Vitamin B-12: 328 pg/mL (ref 180–914)

## 2017-10-17 LAB — HEMOGLOBIN A1C: Hemoglobin A1C: 5.2 % (ref 4.8–6.4)

## 2017-10-17 LAB — TSH: TSH: 1.21 u[IU]/mL (ref 0.45–4.12)

## 2017-10-17 LAB — VITAMIN D 25 HYDROXY: Vit D, 25-Hydroxy: 39.1 ng/mL (ref 30.0–100)

## 2017-10-17 NOTE — Unmapped (Signed)
Alexander [name] is a 31 y.o. male with a history of IDU, hepatitis C infection, & recently-diagnosed HIV infection who presents to the [facility] to establish ongoing care for HIV.    Chief Complaint: HIV  Chief Complaint   Patient presents with   ??? HIV Positive/AIDS       HPI:  Alexander [name] is a 31 y.o. male diagnosed with HIV on 02/05/17. His initial IDC visit was on 02/20/17. He has not been diagnosed with AIDS. His CD4 nadir was 218 on 02/20/17. His self reported risk behavior for acquiring HIV was IDU. He has not experienced prior antiretroviral treatment failure.     His last visit to see me was on 08/11/17. His last CD4 was 509.0(44.9%) on 09/15/2017 and last viral load was 29 on 09/15/2017.    Today, we reviewed his medication adherence, and he reported no missed doses in the last 7 days. He reports no side effects from medications.      Mr Alexander [name] reports that he has been avoiding any relapse for IDU for about 1 month. He also has been cutting back on alcohol as well. He states that it has been about 5 days since his last drink. He has overall been drinking less and less frequently. He denies any acute illness.    He reports that he has been having numbness in his 1st toes and 2nd fingers for the past month or so. The numbness in his fingers comes and goes. In his right foot, the 1st toe remains numb, but he also sometimes has episodic numbness involving the other toes.    CAGE score: 3/4  PHQ-9 score: 10/27    ROS:  Positives appear in bold; otherwise listed symptoms are negative.  General: fever, chills  Cardio: chest pain, palpitations  Pulm: cough, shortness of breath  GI: nausea, diarrhea  Skin: rash, itching     Social history reviewed with patient and updated as appropriate in the electronic medical record.    Physical Exam:  Blood pressure 132/89, pulse 65, temperature 98.3 ??F (36.8 ??C), temperature source Oral, resp. rate 16, height 5' 9 (1.753 m), weight 158 lb (71.7 kg), SpO2 97 %.   General: Well-appearing  male in NAD.   HEENT: PERRL / EOMI. Sclerae anicteric. Mucous membranes moist. Oropharynx clear without lesions.  Neck: No cervical / supraclavicular LAD.   CV/Chest: RRR with no r/g/m. S1 & S2 present. No peripheral edema.  Pulm: CTAB without w/r/r. Normal effort.  Abd: Soft, non-tender, non-distended. Bowel sounds present.   MSK: Able to move all 4 extremities. No joint effusions noted.  Skin: Warm & dry. No rash or skin lesions noted.  Neuro: No gross CN deficits noted. No tremor noted.  Psych: A&Ox3. Normal affect.    Current Outpatient Prescriptions   Medication Sig   ??? buprenorphine HCl Place 2 mg under the tongue daily.   ??? abacavir-dolutegravir-lamivud Take 1 tablet by mouth daily.   ??? ketoconazole Apply topically daily.   ??? lisinopril Take 5 mg by mouth daily.             ??? melatonin    ??? QUEtiapine Take 1 tablet (100 mg total) by mouth at bedtime. In addition to daily doses   ??? QUEtiapine Take 1 tablet (25 mg total) by mouth 2 times a day. In addition to nightly dose   ??? thiamine HCl Take 1 tablet (50 mg total) by mouth daily.   ??? traZODone Take 100 mg by mouth at  bedtime.     No current facility-administered medications for this visit.        Data Review:  Lab Results   Component Value Date    HGB 15.2 09/15/2017    HCT 44.3 09/15/2017    WBC 4.5 09/15/2017    PLT 210 09/15/2017     Lab Results   Component Value Date    NEUTOPHILPCT 51.0 09/15/2017    LYMPHOPCT 27.7 09/15/2017    MONOPCT 12.6 (H) 09/15/2017    EOSPCT 7.7 09/15/2017    BASOPCT 1.0 09/15/2017    NEUTROABS 2,295 09/15/2017    LYMPHSABS 1,247 09/15/2017    MONOSABS 567 09/15/2017    EOSABS 347 09/15/2017    BASOSABS 45 09/15/2017     Lab Results   Component Value Date    NA 140 09/15/2017    K 4.9 09/15/2017    CL 104 09/15/2017    CO2 27 09/15/2017    GLUCOSE 83 09/15/2017    BUN 13 09/15/2017    CREATININE 0.76 09/15/2017    MG 2.0 02/05/2017    PHOS 4.9 (H) 09/15/2017     Lab Results   Component Value Date    ALKPHOS 71 09/15/2017    AST  205 (H) 05/16/2017    ALT 198 (H) 09/15/2017    ALBUMIN 4.6 09/15/2017    ALBUMIN 4.5 09/15/2017    BILIDIRECT 0.09 09/15/2017    BILITOT 0.4 09/15/2017    PROT 7.3 09/15/2017       Labs reviewed as above.    Health maintenance and screening:  Lab Results   Component Value Date    CHOLTOT 118 02/20/2017    TRIG 110 02/20/2017    HDL 35 (L) 02/20/2017    LDL 61 02/20/2017       HBA1c:   Lab Results   Component Value Date    HGBA1C 5.0 11/14/2013     ASCVD risk: The ASCVD Risk score Denman George DC Jr., et al., 2013) failed to calculate for the following reasons:    The 2013 ASCVD risk score is only valid for ages 24 to 31    Vitamin D: No results found for: VITD25H  DEXA: none    Hepatitis A screening:   Lab Results   Component Value Date    HEPAIGG Nonreactive 12/12/2013     Hepatitis B screening:   Lab Results   Component Value Date    HEPBSAG Nonreactive 02/20/2017    HEPBCAB Nonreactive 02/20/2017    HBSAB Reactive (A) 02/20/2017     Hepatitis C screening:   Lab Results   Component Value Date    HCVAB Reactive (A) 02/20/2017    IU 9,604,540 05/16/2017        TB screening:   Lab Results   Component Value Date    QUANTIFERTB NEGATIVE 02/20/2017     Syphilis screening:   Lab Results   Component Value Date    TREPIA Negative 02/20/2017    RPRMONSCRN Non-Reactive 11/15/2013     GC screening:   Lab Results   Component Value Date    [facility] Negative 02/20/2017       Anal cancer screening:   Colon cancer screening: due at age 19    HLA B5701:   Lab Results   Component Value Date    HLAB5701 Negative 02/20/2017       Immunizations:  Influenza: next due fall 2019 (pt reports he received from PCP fall 2018)  PCV: given (03/2017)  PPSV: next  due 12/2018  Tdap: next due 08/2025  Hep A: dose #2 due 11/15/17  Hep B: immune  MenACWY: due?  HPV: >26yo  Zoster: due at age 26  Immunization History   Administered Date(s) Administered   ??? Hep A Adult 05/16/2017   ??? Hep B 3 dose 10/03/2000, 07/26/2001, 01/30/2004, 03/02/2004   ???  Influenza Preservative Free 12/11/2013   ??? MMR 10/03/2000   ??? Meningococcal Conjugate 08/11/2017   ??? Pneumococcal Conjugate: PCV-13 03/07/2017   ??? Pneumococcal Polysaccharide 12/11/2013   ??? Td 01/30/2004   ??? tdap 08/07/2015     Assessment and Plan:  HIV infection (CMS Dx)  Reporting good adherence to Triumeq currently. He has struggled a lot over the past year w/ substance use but continues to make more and more progress. He reports that his last illicit drug use was over 1 month ago and he continues to reduce his alcohol consumption over time, with his goal being ultimately to quit. Recently w/ onset of episodes of peripheral numbness / tingling in a few of his fingers & toes.  - continue Triumeq  - restaging / med monitoring labs today  - discussed medication adherence; pt is now doing better    Preventative health care  - check 25-[place] vitamin D   - pt has a PCP: Dr Herbert Pun    Peripheral polyneuropathy  Uncertain etiology; HIV-associated vs metabolic vs ?drug-induced. So far pt is reporting relatively mild symptoms.  - check HBA1c, vitamin B12, vitamin B1, TSH      Return to [facility]C in ~6 weeks.    Eve Rey ALLEN Iran Kievit

## 2017-10-17 NOTE — Unmapped (Signed)
Pt endorses N/T bilateral index fingers, rt foot all toes, lt ft 1st three toes. Duration - four weeks.  Pt's PCP has concerns re liver.  Pt wants updated HIV levels.

## 2017-10-17 NOTE — Unmapped (Signed)
Please call [phone] to schedule your Fibroscan -- this is a special type of ultrasound for your liver, and once this is done, we can work further on getting approval for medication to treat hepatitis C.    Please continue to take the Triumeq every day to keep the virus under good control!    Please stop by the lab today for some blood tests including some tests to check for the cause of the numbness you have been getting.    Please come back to the [facility] in about 6 weeks for your next visit.

## 2017-10-19 ENCOUNTER — Ambulatory Visit: Admit: 2017-10-19 | Payer: PRIVATE HEALTH INSURANCE

## 2017-10-19 DIAGNOSIS — R748 Abnormal levels of other serum enzymes: Secondary | ICD-10-CM

## 2017-10-19 NOTE — Procedures (Signed)
PROCEDURE NOTE    Procedure: FibroScan    Anesthesia: Not Applicable    Method: The patient was positioned in the supine position with the right arm over the head. The liver was percussed and an appropriate site identified. The FibroScan M probe was utilized in the intercostal space. 10 or more readings were obtained.    Result:  6.6   kPascals with IQR/M=11%     Interpretation: Technically adequate procedure. This degree of liver stiffness is associated with no/mild hepatic fibrosis (Metavir F0/F1).    CAP:  231 dB/M    A low CAP value suggests no clinically significant steatosis is present.

## 2017-10-23 NOTE — Assessment & Plan Note (Addendum)
Reporting good adherence to Triumeq currently. He has struggled a lot over the past year w/ substance use but continues to make more and more progress. He reports that his last illicit drug use was over 1 month ago and he continues to reduce his alcohol consumption over time, with his goal being ultimately to quit. Recently w/ onset of episodes of peripheral numbness / tingling in a few of his fingers & toes.  - continue Triumeq  - restaging / med monitoring labs today  - discussed medication adherence; pt is now doing better    Preventative health care  - check 25-[place] vitamin D   - pt has a PCP: Dr Herbert Pun

## 2017-10-23 NOTE — Unmapped (Signed)
Uncertain etiology; HIV-associated vs metabolic vs ?drug-induced. So far pt is reporting relatively mild symptoms.  - check HBA1c, vitamin B12, vitamin B1, TSH

## 2017-12-01 ENCOUNTER — Encounter

## 2017-12-15 ENCOUNTER — Encounter

## 2018-02-01 ENCOUNTER — Ambulatory Visit: Payer: PRIVATE HEALTH INSURANCE

## 2018-02-01 NOTE — Unmapped (Deleted)
No chief complaint on file.      History of Present Illness  Alexander [name] is a 32 y.o. male with history of hematospermia who was referred by Dr Mayford Knife who presents for evaluation of hematuria.    Onset: ***  Hematuria type: {severity:17925}   Previous episodes: {YES/NO:24207}  Prior evaluation: {YES/NO:24207}  Associated symptoms: {Symptoms; urinary:12437}  Pertinent negatives: {Symptoms; urinary:12437}    History of urinary tract infections: {YES/NO:24207}  History of kidney stones: {YES/NO:24207}  History of previous GU surgeries: {YES/NO:24207}  Personal history of GU malignancy: {YES/NO:24207}  Family history of GU malignancy: {YES/NO:24207}  History of smoking: {YES/NO:24207}    No flowsheet data found.    UA: ***  AUA***  Review of Systems  ROS negative for HEENT, respiratory, cardiovascular, gastrointestinal, endocrine, hematology, neurologic, psychiatric, skin, and musculo-skeletal systems except as otherwise documented in HPI.    Medical / Surgical History  Patient has a past medical history of Anxiety; Bipolar disorder (CMS Dx); Bronchitis; Depression; Drug abuse and dependence (CMS Dx); H/O tooth extraction; Hepatitis C; Liver disease (Hep C); PTSD (post-traumatic stress disorder); Substance induced mood disorder (CMS Dx); and Thyroid disease.    Patient has a past surgical history that includes Mandible surgery.    Medications  Outpatient Encounter Prescriptions as of 02/01/2018   Medication Sig Dispense Refill   ??? abacavir-dolutegravir-lamivud (TRIUMEQ) 600-50-300 mg Tab Take 1 tablet by mouth daily. 30 tablet 11   ??? buprenorphine HCl (SUBUTEX) 2 mg Subl Place 2 mg under the tongue daily.     ??? ketoconazole (NIZORAL) 2 % cream Apply topically daily.     ??? lisinopril (PRINIVIL,ZESTRIL) 5 MG tablet Take 5 mg by mouth daily.               ??? melatonin 3 mg Tab      ??? QUEtiapine (SEROQUEL) 100 MG tablet Take 1 tablet (100 mg total) by mouth at bedtime. In addition to daily doses 30 tablet 0   ???  QUEtiapine (SEROQUEL) 25 MG tablet Take 1 tablet (25 mg total) by mouth 2 times a day. In addition to nightly dose 60 tablet 0   ??? thiamine HCl (VITAMIN B-1) 50 MG tablet Take 1 tablet (50 mg total) by mouth daily. 30 tablet 3   ??? traZODone (DESYREL) 100 MG tablet Take 100 mg by mouth at bedtime.       No facility-administered encounter medications on file as of 02/01/2018.        Allergies  Patient has no known allergies.    Social / Family History  Patient reports that he has been smoking Cigarettes.  He has a 11.00 pack-year smoking history. He has never used smokeless tobacco. He reports that he drinks alcohol. He reports that he uses drugs, including Heroin, Other-see comments, Marijuana, and Cocaine.    Patient's family history includes Breast Cancer in his father; Cancer in his father; Drug abuse in his brother and brother; Hypertension in his father and mother; Seizures in his father; Suicidality in his brother, brother, and father; Transient ischemic attack in his mother.    The following portions of the patient's history were reviewed and updated as appropriate: past medical history, past surgical history, current medications, allergies, past social history, past family history, problem list.    Physical exam  Vital signs and nursing notes reviewed.  There were no vitals taken for this visit.    Constitutional: oriented to person, place, and time  Head: normocephalic  Eyes: conjunctivae and  extraocular motions are normal  Neck: normal range of motion  Cardiovascular: normal rate and regular rhythm  Pulmonary: normal respiratory effort, no respiratory distress  Abdominal: soft, no distention or mass, nontender, no CVA tenderness  Musculoskeletal: normal range of motion without deformity  Neurological: grossly normal    Genitourinary Exam  Groin: normal without lymphadenopathy   Scrotum: scrotal skin {scrotum:32466::normal}  Testes: {pe testes:310184::normal, no masses}  Epididymis: {epididymis  exam:311387::normal}  Penis: {pe penis:310144::normal without lesions or discharge}  Prostate: ***    Labs  Creatinine   Date Value Ref Range Status   10/17/2017 0.79 0.60 - 1.30 mg/dL Final   16/09/9603 5.40 0.60 - 1.30 mg/dL Final   98/10/9146 8.29 0.60 - 1.30 mg/dL Final     No results found for: PSA    Assessment  Hematuria, {severity:17925}    Plan  CT Urogram (BMP if not done in last 30 days)  Urine cytology  Urinalysis and urine culture  Schedule cystoscopy in the office after CT    Medical Decision Making  The following items were considered in medical decision making:  Review / order clinical lab tests  Review / order radiology tests  Review / order other diagnostic tests/interventions  Reviewed outside records    Junita Push, APRN

## 2018-02-27 ENCOUNTER — Inpatient Hospital Stay
Admit: 2018-02-27 | Discharge: 2018-02-28 | Disposition: A | Discharge status: Other Acute Inpatient Hospital | Payer: PRIVATE HEALTH INSURANCE

## 2018-02-27 DIAGNOSIS — T43592A Poisoning by other antipsychotics and neuroleptics, intentional self-harm, initial encounter: Secondary | ICD-10-CM

## 2018-02-27 DIAGNOSIS — R45851 Suicidal ideations: Secondary | ICD-10-CM

## 2018-02-27 LAB — URINE DRUG SCREEN WITHOUT CONFIRMATION, STAT
Amphetamine, 500 ng/mL Cutoff: NEGATIVE
Barbiturates UR, 300  ng/mL Cutoff: NEGATIVE
Benzodiazepines UR, 300 ng/mL Cutoff: POSITIVE
Buprenorphine, 5 ng/mL Cutoff: NEGATIVE
Cocaine UR, 300 ng/mL Cutoff: POSITIVE
Fentanyl, 2 ng/mL Cutoff: NEGATIVE
Methadone, UR, 300 ng/mL Cutoff: NEGATIVE
Opiates UR, 300 ng/mL Cutoff: NEGATIVE
Oxycodone, 100 ng/mL Cutoff: NEGATIVE
THC UR, 50 ng/mL Cutoff: NEGATIVE
Tricyclic Antidepressants, 300 ng/mL Cutoff: POSITIVE

## 2018-02-27 LAB — BASIC METABOLIC PANEL
Anion Gap: 8 mmol/L (ref 3–16)
BUN: 15 mg/dL (ref 7–25)
CO2: 25 mmol/L (ref 21–33)
Calcium: 9.2 mg/dL (ref 8.6–10.3)
Chloride: 109 mmol/L (ref 98–110)
Creatinine: 0.81 mg/dL (ref 0.60–1.30)
Glucose: 103 mg/dL (ref 70–100)
Osmolality, Calculated: 295 mOsm/kg (ref 278–305)
Potassium: 4.7 mmol/L (ref 3.5–5.3)
Sodium: 142 mmol/L (ref 133–146)
eGFR AA CKD-EPI: >90 See note.
eGFR NONAA CKD-EPI: >90 See note.

## 2018-02-27 LAB — ACETAMINOPHEN LEVEL: Acetaminophen Level: <10 ug/mL (ref 10–30)

## 2018-02-27 LAB — URINALYSIS-MACROSCOPIC W/REFLEX TO MICROSCOPIC
Bilirubin, UA: NEGATIVE
Blood, UA: NEGATIVE
Glucose, UA: NEGATIVE mg/dL
Ketones, UA: NEGATIVE mg/dL
Leukocytes, UA: NEGATIVE
Nitrite, UA: NEGATIVE
Protein, UA: NEGATIVE mg/dL
Specific Gravity, UA: 1.02 (ref 1.005–1.035)
Urobilinogen, UA: 0.2 EU/dL (ref 0.2–1.0)
pH, UA: 8.5 (ref 5.0–8.0)

## 2018-02-27 LAB — DIFFERENTIAL
Basophils Absolute: 26 /uL (ref 0–200)
Basophils Relative: 0.4 % (ref 0.0–1.0)
Eosinophils Absolute: 26 /uL (ref 15–500)
Eosinophils Relative: 0.4 % (ref 0.0–8.0)
Lymphocytes Absolute: 1082 /uL (ref 850–3900)
Lymphocytes Relative: 16.4 % (ref 15.0–45.0)
Monocytes Absolute: 370 /uL (ref 200–950)
Monocytes Relative: 5.6 % (ref 0.0–12.0)
Neutrophils Absolute: 5095 /uL (ref 1500–7800)
Neutrophils Relative: 77.2 % (ref 40.0–80.0)
nRBC: 0 /100 WBC (ref 0–0)

## 2018-02-27 LAB — CBC
Hematocrit: 40.3 % (ref 38.5–50.0)
Hemoglobin: 14 g/dL (ref 13.2–17.1)
MCH: 31.5 pg (ref 27.0–33.0)
MCHC: 34.7 g/dL (ref 32.0–36.0)
MCV: 90.7 fL (ref 80.0–100.0)
MPV: 9.8 fL (ref 7.5–11.5)
Platelets: 188 10*3/uL (ref 140–400)
RBC: 4.44 10*6/uL (ref 4.20–5.80)
RDW: 13.6 % (ref 11.0–15.0)
WBC: 6.6 10*3/uL (ref 3.8–10.8)

## 2018-02-27 LAB — HEPATIC FUNCTION PANEL
ALT: 249 U/L (ref 7–52)
AST: 197 U/L (ref 13–39)
Albumin: 4 g/dL (ref 3.5–5.7)
Alkaline Phosphatase: 96 U/L (ref 36–125)
Bilirubin, Direct: 0.1 mg/dL (ref 0.0–0.4)
Bilirubin, Indirect: 0.1 mg/dL (ref 0.0–1.1)
Total Bilirubin: 0.2 mg/dL (ref 0.0–1.5)
Total Protein: 6.8 g/dL (ref 6.4–8.9)

## 2018-02-27 LAB — PROTIME-INR
INR: 1 (ref 0.9–1.1)
Protime: 13.4 seconds (ref 12.1–15.1)

## 2018-02-27 LAB — SALICYLATE LEVEL: Salicylate Lvl: <3 mg/dL (ref 10–30)

## 2018-02-27 LAB — ETHANOL, SERUM: Ethanol: <10 mg/dL (ref 0–10)

## 2018-02-27 MED ORDER — multivitamin with folic acid tablet 1 tablet
400 | Freq: Once | ORAL | Status: AC
Start: 2018-02-27 — End: 2018-02-27
  Administered 2018-02-27: 21:00:00 1 via ORAL

## 2018-02-27 MED ORDER — proMETHazine (PHENERGAN) 25 MG tablet
25 | ORAL_TABLET | Freq: Four times a day (QID) | ORAL | 0 refills | Status: AC | PRN
Start: 2018-02-27 — End: 2019-08-06

## 2018-02-27 MED ORDER — thiamine mononitrate (VITAMIN B-1) tablet 100 mg
100 | Freq: Every day | ORAL | Status: AC
Start: 2018-02-27 — End: 2018-02-27
  Administered 2018-02-27: 21:00:00 100 mg via ORAL

## 2018-02-27 MED ORDER — loperamide (IMODIUM) 2 mg capsule
2 | ORAL_CAPSULE | Freq: Four times a day (QID) | ORAL | 0 refills | Status: AC | PRN
Start: 2018-02-27 — End: 2020-02-01

## 2018-02-27 MED ORDER — folic acid (FOLVITE) tablet 1 mg
1 | Freq: Once | ORAL | Status: AC
Start: 2018-02-27 — End: 2018-02-27
  Administered 2018-02-27: 21:00:00 1 mg via ORAL

## 2018-02-27 MED ORDER — cloNIDine HCl (CATAPRES) 0.1 MG tablet
0.1 | ORAL_TABLET | Freq: Three times a day (TID) | ORAL | 0 refills | Status: AC | PRN
Start: 2018-02-27 — End: 2020-02-01

## 2018-02-27 MED ORDER — hydrOXYzine (VISTARIL) 100 MG capsule
100 | ORAL_CAPSULE | Freq: Four times a day (QID) | ORAL | 0 refills | Status: AC | PRN
Start: 2018-02-27 — End: 2020-02-01

## 2018-02-27 MED ORDER — ibuprofen (ADVIL,MOTRIN) 600 MG tablet
600 | ORAL_TABLET | Freq: Four times a day (QID) | ORAL | 0 refills | Status: AC | PRN
Start: 2018-02-27 — End: 2020-02-01

## 2018-02-27 MED FILL — THERA 400 MCG TABLET: 400 400 mcg | ORAL | Qty: 1

## 2018-02-27 MED FILL — VITAMIN B-1 (MONONITRATE) 100 MG TABLET: 100 100 mg | ORAL | Qty: 1

## 2018-02-27 MED FILL — FOLIC ACID 1 MG TABLET: 1 1 MG | ORAL | Qty: 1

## 2018-02-27 NOTE — Unmapped (Signed)
Pt is a 32 yo cau male who was transferred from [facility] after being tx for an OD of Seroquel. Pt states he ran out of his Subutex because he did not go to his Apt to get it. When he made an attempt to get it he was told he now has to go through the intake process to get back on it. Pt then relapsed on heroin and ETOH. He also did cocaine. Pt wants to be someplace safe until his CM can get him into tx. He wants to go to [facility] in [place] . Pt c/o his stressors being something to do with his Ex. He found out this year that he is HIV+. He found his brother dead after his brother shot himself in the head. He had a friend hang himself and his father died with something to do with drug abuse. Pt admits his entire family does/has had a drug problem. He also had a brother who killed himself a while ago. Pt given an bed and a meal. Will continue to monitor and support.

## 2018-02-27 NOTE — Unmapped (Signed)
PSW called [facility] , spoke to Waymon Budge for [employer] , there is no definite time at this time [facility] is extremely busy , they will come as soon as possible. Caryn Bee called back they will be here in 15 minutes 8:15pm

## 2018-02-27 NOTE — Unmapped (Signed)
PSW was advised that pt is in the ED and will require PSW services. Due to time, this pt will be passed on to next shift PSW.    Morley Kos, LISW - S  Psychiatric Social Worker  [place] [facility] [employer]   [phone]

## 2018-02-27 NOTE — Unmapped (Signed)
Psych Social Work Brief Note      Presentation: Patient is a 32 y/o Caucasian Male who presented to [facility] by case manager after taking 13 100 mg Seroquel tabs last night after drinking alcohol and cocaine use. Patient reports doing this in hopes to not wake up, and reports being depressed. Patient is presenting impulsive.    Current Mental Status: Patient reports +SI with a plan to OD. Patient reports past SI, and in unsure how long ago. Patient reported a past attempt to OD on a bottle of Tylenol, and another time attempted by Heroin. Patient reports he doesn't want to feel this way, and cant say how long. Patient reports one of his main issues is withdrawal, and he wants medication. Patient denies HI and AVH. Patient reports he wants the hospital to give him the medication he needs. Patient reports having no supports, and has never been social due to his anxiety. Patient reported a family Hx of suicide, one brother overdosed in 2008, and another brother shot himself in 2009.    Substance Use: Patient has a Hx of poly substance use. Patient reports just about every substance there is and reported using Heroin on Saturday and Crack Cocaine and Alcohol yesterday. Patient reports he is currently trying to get into [facility] for Addiction services.    History of Mental Health Treatment: Patient reports a Dx of Bipolar, Anxiety, Severe Depression, and PTSD. Patient reports being Hospitalized at [facility] and a couple places in [place]. Patient reports having a CM and seeing them often through [facility], and attempting to go to therapy through the [facility] but reported everything got fucked up.     Collateral: Patient reports not having the CM Ann's number who dropped him off. PSW called patients other CM Wendi Maya, [phone], and left message for call back.     Formulation of Plan: Patient meets the requirements for a  SOB and will be transferred to [facility] once medically cleared. Patient is +SI with a plan to OD, Patient denies HI and AVH. Patient is impulsive, a danger to himself, and unable to contract for safety.    Patient Reaction to Plan: NA, Patient reported to PSW he did not want to go to [facility].    Handoff of Communication: [facility] notified    Transportation Plan: [facility] will be called at time of transport    Lossie Faes Student Social Work

## 2018-02-27 NOTE — Unmapped (Signed)
Patient with SI.  Took 13 100mg  seroquel at 2200 last night.  Alert, calm, appears well

## 2018-02-27 NOTE — Unmapped (Signed)
[facility] Psychiatric Emergency  Service Evaluation    Reason for Visit/Chief Complaint: Suicide Attempt      Patient History   PER RN:  Pt is a 32 yo cau male who was transferred from [facility] after being tx for an OD of Seroquel. Pt states he ran out of his Subutex because he did not go to his Apt to get it. When he made an attempt to get it he was told he now has to go through the intake process to get back on it. Pt then relapsed on heroin and ETOH. He also did cocaine. Pt wants to be someplace safe until his CM can get him into tx. He wants to go to [facility] in [place] . Pt c/o his stressors being something to do with his Ex. He found out this year that he is HIV+. He found his brother dead after his brother shot himself in the head. He had a friend hang himself and his father died with something to do with drug abuse. Pt admits his entire family does/has had a drug problem. He also had a brother who killed himself a while ago. Pt given an bed and a meal. Will continue to monitor and support.     HPI   32 yo male with hx of polysubstance use presents a transfer from [facility] on a SOB for SI. On exam, pt reports he has struggled with opioid addiction intermittently for years. Admits that he missed his appointment for suboxone yesterday so ended up relapsing on cocaine and alcohol and then took 13 tablets of seroquel (while intoxicated) in hopes that he would not wake up. Reports that when he woke up this am, he called his case manager who convinced him to come to the hospital for help. Of note, on exam, pt requests subutex multiple times and is very drug seeking in nature.    On exam, pt denies any current SI, intent, or plan. he is able to contract for safety and has no access to guns or weapons. he has an appropriate and reactive affect and is is future oriented throughout with plans to get back into treatment. Pt denies any significant issues recently with mood, anxiety, appetite, or sleep outside of very acute issue  related to relapse. Additionally, he denies HI, any prior history of mania, and AVH. he cites family as protective. I have no reason to suspect the pt is minimizing at the history provided is consistent with chart review/collateral.       PES Triage Screening:  Broset score:             PSS- Safety Screen Score: 5  Suicide Screen: Is patient expressing suicidal ideations?: Yes    Is Admission due to self harm?: Yes    Has Patient Attempted Suicide or Self Harm in past 72 hours?: Yes    Is Patient experiencing acute agitation, anxiety or insomnia?  : No    Context: stress and nonadherence with meds  Location: Altered mental status of mood  Duration: 2 days.  Severity: moderate.  Associated Symptoms: moderate.  Modifying Factors: medication noncompliance, intoxication.  Timing: Constant.      Past Psychiatric History:     Hospitalizations: yes - multiple (2008-2015).    Past suicide attempts: yes - attempted Tylenol OD (2015).    History of violence: yes - previous assault charge 2/2 self defense; served 6 months of jail time   [facility] (Dr. Penelope Coop) -  Case Manager - [place] :  appt 9/7  ??  Substance Use History:    Recent use of alcohol (12 cans/day) as subutex not available; denies alcohol use               Hx of cannabis and cocaine use (UDS also + for TCA)              Random use of acid, mushrooms  ??    PMH:     Past Medical History:   Diagnosis Date   ??? Anxiety    ??? Bipolar disorder (CMS Dx)    ??? Bronchitis    ??? Depression    ??? Drug abuse and dependence (CMS Dx)    ??? H/O tooth extraction    ??? Hepatitis C    ??? Liver disease Hep C   ??? PTSD (post-traumatic stress disorder)    ??? Substance induced mood disorder (CMS Dx)    ??? Thyroid disease      I have reviewed the past medical history.  Additional history obtained: no    Social History:    Work History:  unemployed    Social History     Social History   ??? Marital status: Single     Spouse name: N/A   ??? Number of children: N/A   ??? Years of education: N/A      Social History Main Topics   ??? Smoking status: Current Every Day Smoker     Packs/day: 1.00     Years: 11.00     Types: Cigarettes   ??? Smokeless tobacco: Never Used   ??? Alcohol use Yes      Comment: 10/17/17: reports drinking ~1 day per week   ??? Drug use: Yes     Types: Heroin, Other-see comments, Marijuana, Cocaine      Comment: 10/17/17: reports last illicit drug use ~1 month ago   ??? Sexual activity: Yes     Partners: Female, Male     Birth control/ protection: Condom, Other-see comments     Other Topics Concern   ??? Caffeine Use Yes   ??? Occupational Exposure No   ??? Exercise No   ??? Seat Belt Yes     Social History Narrative   ??? None     I have reviewed the past social history.  Additional history obtained: no.    Family History:    Family History   Problem Relation Age of Onset   ??? Drug abuse Brother    ??? Suicidality Brother    ??? Hypertension Mother    ??? Transient ischemic attack Mother    ??? Seizures Father    ??? Hypertension Father    ??? Cancer Father    ??? Breast Cancer Father    ??? Suicidality Father    ??? Drug abuse Brother    ??? Suicidality Brother      I have reviewed the past family history.  Additional history obtained: no.    Medications:  Discharge Medication List as of 02/27/2018 11:05 PM      CONTINUE these medications which have NOT CHANGED    Details   abacavir-dolutegravir-lamivud (TRIUMEQ) 600-50-300 mg Tab Take 1 tablet by mouth daily., Starting Fri 08/11/2017, Normal, Disp-30 tablet, R-11      buprenorphine HCl (SUBUTEX) 2 mg Subl Place 2 mg under the tongue daily., Historical Med      ketoconazole (NIZORAL) 2 % cream Apply topically daily., Historical Med      lisinopril (PRINIVIL,ZESTRIL) 5 MG tablet Take 5  mg by mouth daily.          , Starting Thu 03/30/2017, Historical Med      melatonin 3 mg Tab Starting Thu 03/30/2017, Historical Med      !! QUEtiapine (SEROQUEL) 100 MG tablet Take 1 tablet (100 mg total) by mouth at bedtime. In addition to daily doses, Starting Tue 07/25/2017, Until Wed 07/25/2018,  Print, Disp-30 tablet, R-0      !! QUEtiapine (SEROQUEL) 25 MG tablet Take 1 tablet (25 mg total) by mouth 2 times a day. In addition to nightly dose, Starting Tue 07/25/2017, Until Wed 07/25/2018, Print, Disp-60 tablet, R-0      thiamine HCl (VITAMIN B-1) 50 MG tablet Take 1 tablet (50 mg total) by mouth daily., Starting Tue 04/11/2017, Normal      traZODone (DESYREL) 100 MG tablet Take 100 mg by mouth at bedtime., Historical Med       !! - Potential duplicate medications found. Please discuss with provider.          Allergies:   Allergies as of 02/27/2018   ??? (No Known Allergies)       Review of Systems     Review of Systems   Constitutional: Negative for fever.   HENT: Negative for rhinorrhea and sore throat.    Respiratory: Negative for shortness of breath.    Cardiovascular: Negative for chest pain.   Gastrointestinal: Negative for abdominal pain, constipation, diarrhea and nausea.   Genitourinary: Negative for dysuria.   Musculoskeletal: Negative for arthralgias.   Neurological: Negative for headaches.   Psychiatric/Behavioral: Negative for suicidal ideas.         Physical Exam/Objective Data     ED Triage Vitals [02/27/18 2049]   Vital Signs Group      Temp 98.7 ??F (37.1 ??C)      Temp Source Oral      Heart Rate 78      Heart Rate Source Automatic      Resp 17      SpO2 100 %      BP 125/75      MAP (mmHg)       BP Location Left arm      BP Method Automatic      Patient Position Sitting   SpO2 100 %   O2 Device None (Room air)       Physical Exam    Mental Status Exam:     Gait and Muscle Strength:  Normal and Muscle strength intact  Appearance and Behavior: Cooperative and Open Historian      NL Body Habitus  Speech: NL articulation, prosody, volume and production  Language: Naming intact  Mood: appropriate and irritable  Affect: appropriate and reactive  Thought Process and Associations: goal directed and no derailment       No loose associations  Thought Content: no suicidal/homicidal with plans for the  future, contracting for safety and plans to provide self with basic needs  Abnormal or psychotic thoughts: None  Orientation: person, place and time/date  Memory: recent, remote, and immediate recall intact  Attention and Concentration: intact  Abstraction: Attention and concentration intact  Fund of Knowledge: average  Insight and Judgement: Partial     Fair        Labs:    Please see electronic medical record for any tests performed in the ED.    Recent Results (from the past 24 hour(s))   Basic metabolic panel    Collection Time: 02/27/18  3:22  PM   Result Value Ref Range    Sodium 142 133 - 146 mmol/L    Potassium 4.7 3.5 - 5.3 mmol/L    Chloride 109 98 - 110 mmol/L    CO2 25 21 - 33 mmol/L    Anion Gap 8 3 - 16 mmol/L    BUN 15 7 - 25 mg/dL    Creatinine 1.61 0.96 - 1.30 mg/dL    Glucose 045 (H) 70 - 100 mg/dL    Calcium 9.2 8.6 - 40.9 mg/dL    Osmolality, Calculated 295 278 - 305 mOsm/kg    eGFR AA CKD-EPI >90 See note.    eGFR NONAA CKD-EPI >90 See note.   CBC    Collection Time: 02/27/18  3:22 PM   Result Value Ref Range    WBC 6.6 3.8 - 10.8 10E3/uL    RBC 4.44 4.20 - 5.80 10E6/uL    Hemoglobin 14.0 13.2 - 17.1 g/dL    Hematocrit 81.1 91.4 - 50.0 %    MCV 90.7 80.0 - 100.0 fL    MCH 31.5 27.0 - 33.0 pg    MCHC 34.7 32.0 - 36.0 g/dL    RDW 78.2 95.6 - 21.3 %    Platelets 188 140 - 400 10E3/uL    MPV 9.8 7.5 - 11.5 fL   Differential    Collection Time: 02/27/18  3:22 PM   Result Value Ref Range    Neutrophils Relative 77.2 40.0 - 80.0 %    Lymphocytes Relative 16.4 15.0 - 45.0 %    Monocytes Relative 5.6 0.0 - 12.0 %    Eosinophils Relative 0.4 0.0 - 8.0 %    Basophils Relative 0.4 0.0 - 1.0 %    nRBC 0 0 - 0 /100 WBC    Neutrophils Absolute 5,095 1,500 - 7,800 /uL    Lymphocytes Absolute 1,082 850 - 3,900 /uL    Monocytes Absolute 370 200 - 950 /uL    Eosinophils Absolute 26 15 - 500 /uL    Basophils Absolute 26 0 - 200 /uL   Hepatic Function Panel    Collection Time: 02/27/18  3:22 PM   Result Value Ref  Range    Total Bilirubin 0.2 0.0 - 1.5 mg/dL    Bilirubin, Direct 0.1 0.0 - 0.4 mg/dL    AST 086 (H) 13 - 39 U/L    ALT 249 (H) 7 - 52 U/L    Alkaline Phosphatase 96 36 - 125 U/L    Total Protein 6.8 6.4 - 8.9 g/dL    Albumin 4.0 3.5 - 5.7 g/dL    Bilirubin, Indirect 0.1 0.0 - 1.1 mg/dL   Acetaminophen level    Collection Time: 02/27/18  3:22 PM   Result Value Ref Range    Acetaminophen Level <10 (L) 10 - 30 ug/mL   Salicylate level    Collection Time: 02/27/18  3:22 PM   Result Value Ref Range    Salicylate Lvl <3 (L) 10 - 30 mg/dL   Protime-INR    Collection Time: 02/27/18  3:22 PM   Result Value Ref Range    Protime 13.4 12.1 - 15.1 seconds    INR 1.0 0.9 - 1.1   Ethanol, Serum    Collection Time: 02/27/18  3:22 PM   Result Value Ref Range    Ethanol <10 0 - 10 mg/dL   Urine Drug Screen w/o Confirmation,Stat    Collection Time: 02/27/18  5:15 PM   Result Value Ref Range  Amphetamine, 500 ng/mL Cutoff Negative Negative    Barbiturates UR, 300  ng/mL Cutoff Negative Negative    Buprenorphine, 5 ng/mL Cutoff Negative Negative    Benzodiazepines UR, 300 ng/mL Cutoff Presumptive Positive (A) Negative    Cocaine UR, 300 ng/mL Cutoff Presumptive Positive (A) Negative    Methadone, UR, 300 ng/mL Cutoff Negative Negative    Opiates UR, 300 ng/mL Cutoff Negative Negative    Oxycodone, 100 ng/mL Cutoff Negative Negative    Tricyclic Antidepressants, 300 ng/mL Cutoff Presumptive Positive (A) Negative    THC UR, 50 ng/mL Cutoff Negative Negative    Fentanyl, 2 ng/mL Cutoff Negative Negative   Urinalysis-Macroscopic w/Rfx to Microsco    Collection Time: 02/27/18  5:15 PM   Result Value Ref Range    Color, UA Yellow Yellow,Straw    Clarity, UA Clear Clear    Specific Gravity, UA 1.020 1.005 - 1.035    pH, UA 8.5 (H) 5.0 - 8.0    Protein, UA Negative Negative mg/dL    Glucose, UA Negative Negative mg/dL    Ketones, UA Negative Negative mg/dL    Bilirubin, UA Negative Negative    Blood, UA Negative Negative    Nitrite, UA  Negative Negative    Urobilinogen, UA 0.2 E.U./dL 0.2 - 1.0 EU/dL    Leukocytes, UA Negative Negative       Radiology and EKG:  No results found.    EKG: Please see electronic medical record for any studies performed in the ED.    Emergency Course and Plan     Alexander [name] is a 32 y.o. male with hx of polysubstance use who presented to the emergency department with SI in the setting of a recent relapse. On exam, however, pt is very drug seeking in nature. He denies SI and is future oriented throughout with a reactive affect. Additionally, as pt is currently sober, imminent risk of harm to self is actually quite low relatively speaking. Given these facts,discharge to the community is currently the least restrictive means whereby further observation, evaluation, and treatment can be done safely. Pt given comfort meds for opioid withdrawal. Discussed r/b/se, verbalized understanding.    Diagnosis:    Primary psychiatric Diagnosis: opioid use disorder  Other psychiatric Diagnoses: depression, unspecified  Substance Use Diagnoses: alcohol use disorder, r/o cocaine use disorder  Medical Diagnoses: hepatitis       Disposition:      Discharged from the ED. See AVS for prescriptions, followup, and discharge instructions.  No emergency medical condition present at discharge.  Patient not deemed to be an imminent threat of harm to self or others.  Patient has a good safety plan and discharge disposition in place. Protective factors: Connected to Services and Sense of Purpose.   Summary of rationale for disposition: see above, not homicidal, suicidal, or gravely disabled by mental illness. Able to meet needs in the community, safe dc plan in place.    Provider completing note: Resident, supervised by Dr. Okey Dupre.    Patient was in OTA.     Patient had a completed Statement of Belief during this encounter:yes  , released by attending physician.    Medications given in PES: no.  Medications prescribed for home or inpatient use:  yes  Laboratory work ordered: no.  Other diagnostic studies ordered: no.  Old and/or outside medical records reviewed: yes.  Collateral information contacted: no.  Patient's outside provider contacted: no.         Catalina Gravel, MD  Resident  02/28/18 0111

## 2018-02-27 NOTE — Unmapped (Signed)
ED Attending Attestation Note    Date of service:  02/27/2018    This patient was seen by the resident physician.  I have seen and examined the patient, agree with the workup, evaluation, management and diagnosis. The care plan has been discussed and I concur.  I have reviewed the ECG and concur with the resident's interpretation.    My assessment reveals a 32 y.o. male With a history of heroin abuse and polysubstance abuse as well as hepatitis C, HIV presents today with suicidal ideation.  He denies any actual attempt to harm himself but has been having suicidal thoughts.  Hold was signed.  Also had previously been on buprenorphine, but relapsed on heroin this weekend, 3 days ago.  No narcotic use within last 24 hours.

## 2018-02-27 NOTE — Unmapped (Signed)
Opioid Use Disorder  Opioids are powerful substances that relieve pain. Opioids include illegal drugs, such as heroin, as well as prescription pain medicines, such as codeine, morphine, hydrocodone, oxycodone, and fentanyl.  Opioid use disorder is when you take opioids for nonmedical reasons even though taking them hurts your health and well-being. Taking prescribed opioids regularly can lead to dependence, especially if you take them in larger amounts or more often than they should be taken. Opioid use disorder often disrupts life at home, work, or school. It can cause mental and physical problems. It also increases your risk of suicide and death from overdose.  What are the causes?  This condition is caused by taking opioids. Taking opioids repeatedly results in changes in the brain that make it hard to control opioid use. Many people develop this condition because they like the way they feel when they take opioids or because they get addicted to them.  What increases the risk?  This condition is more likely to develop in:  ?? People with a family history of opioid use disorder.  ?? People who misuse other drugs.  ?? People with a mental illness, such as depression, post-traumatic stress disorder, or antisocial personality disorder.  ?? People who begin use at an early age, such as during their teen years.  What are the signs or symptoms?  Symptoms of this condition include:  ?? Taking greater amounts of an opioid than you want to or for longer than you want to.  ?? Trying several times to control your opioid use.  ?? Spending a lot of time getting opioids, using them, or recovering from their effects.  ?? Craving opioids.  ?? Having problems at work, at school, at home, or in a relationship because of opioid use.  ?? Giving up or cutting down on important life activities because of opioid use.  ?? Using opioids when it is dangerous, such as when driving a car.  ?? Continuing to use an opioid even though it has led to a  physical problem, such as:  ?? Severe constipation.  ?? Poor nutrition.  ?? Infertility.  ?? Tuberculosis.  ?? Aspiration pneumonia.  ?? An infection, such as hepatitis or HIV (human immunodeficiency virus).  ?? Continuing to use an opioid even though it is causing a mental problem, such as:  ?? Depression or anxiety.  ?? Hallucinations.  ?? Sleep problems.  ?? Loss of interest in sex.  ?? Needing more and more of an opioid to get the same effect that you want from the opioid (building up a tolerance).  ?? Having symptoms of withdrawal when you stop using an opioid. Some symptoms of withdrawal are:  ?? Depression or anxiety.  ?? Irritability.  ?? Nausea or vomiting.  ?? Muscle aches or spasms.  ?? Watery eyes.  ?? Trouble sleeping.  ?? Yawning.  How is this diagnosed?  This condition is diagnosed with an assessment. During the assessment, your health care provider will ask about your opioid use and how it affects your life.  Your health care provider may perform a physical exam or do lab tests to see if you have physical problems resulting from opioid use. Your health care provider may also screen for drug use and refer you to a mental health professional for evaluation.  How is this treated?  Treatment for this condition is usually provided by mental health professionals with training in substance use disorders. The first step in treatment is detoxification, which involves taking medicines   to lessen withdrawal symptoms. Additional treatment may involve:  ?? Counseling. This treatment is also called talk therapy. It is provided by substance use treatment counselors. A counselor can address the reasons you use opioids and suggest ways to keep you from using opioids again. The goals of talk therapy are to:  ?? Find healthy activities and ways to cope with stress.  ?? Identify and avoid what triggers your opioid use.  ?? Help you learn how to handle cravings.  ?? Support groups. Support groups are run by people who have quit using opioids.  They provide emotional support, advice, and guidance.  ?? A medicine that blocks opioid receptors in your brain. This medicine can reduce opioid cravings that lead to relapse. This medicine also blocks the good feeling that you get from using opioids.  ?? Opioid maintenance treatment. This involves taking certain kinds of opioid medicines. These medicines satisfy cravings but are safer than commonly misused opioids. This is often the best option for people who continue to relapse with other treatments.  Follow these instructions at home:  ?? Take over-the-counter and prescription medicines only as told by your health care provider.  ?? Check with your health care provider before starting any new medicines.  ?? Keep all follow-up visits as told by your health care provider. This is important.  Where to find more information:  ?? National Institute on Drug Abuse: www.drugabuse.gov  ?? Substance Abuse and Mental Health Services Administration: www.samhsa.gov  Contact a health care provider if:  ?? You are not able to take your medicines as told.  ?? Your symptoms get worse.  Get help right away if:  ?? You may have taken too much of an opioid (overdosed).  ?? You have serious thoughts about hurting yourself or others.  If you ever feel like you may hurt yourself or others, or have thoughts about taking your own life, get help right away. You can go to your nearest emergency department or call:  ?? Your local emergency services (911 in the [place]  ?? A suicide crisis helpline, such as the National Suicide Prevention Lifeline at 1-800-273-8255. This is open 24 hours a day.  This information is not intended to replace advice given to you by your health care provider. Make sure you discuss any questions you have with your health care provider.  Document Released: 09/18/2007 Document Revised: 09/02/2016 Document Reviewed: 09/02/2016  Elsevier Interactive Patient Education ?? 2018 [employer]

## 2018-02-27 NOTE — Unmapped (Signed)
Clinical Opiate Withdrawal Scale Completed, total score of 10 at this time. ( see media tab for details of documented assessment)

## 2018-02-27 NOTE — Unmapped (Signed)
[facility] ED Note    Date of service:  02/27/2018    Reason for Visit: Suicidal      Patient History     HPI:  Alexander [name] is a 32 y.o. male with PMHx of HIV, IVDA, Hep C, bipolar, axiety, depression, PTSD who presents with a chief complaint of suicidal ideation.  Patient states that he took 13 100mg  Seroquel tabs at 2200 last night after heavy alcohol and cocaine use, hoping that he would fall asleep and not wake up.  He states that he was disappointed that he woke up this morning, and is still actively having suicidal thoughts.  He has history of IVDA, takes Subutex, but states he ran out recently.  Reports last used Heroin on 3 days ago.  He reports that he has been going through withdrawals from his Subutex, and thinks that, along with the heavy alcohol use, may be leading to his depression and SI.  He denies any HI.  The patient denies any fever, chills, rhinorrhea, sore throat, cough, chest pain, nausea, vomiting, diarrhea, abdominal pain.     Past Medical History:   Diagnosis Date   ??? Anxiety    ??? Bipolar disorder (CMS Dx)    ??? Bronchitis    ??? Depression    ??? Drug abuse and dependence (CMS Dx)    ??? H/O tooth extraction    ??? Hepatitis C    ??? Liver disease Hep C   ??? PTSD (post-traumatic stress disorder)    ??? Substance induced mood disorder (CMS Dx)    ??? Thyroid disease        Past Surgical History:   Procedure Laterality Date   ??? MANDIBLE SURGERY         Alexander [name]  reports that he has been smoking Cigarettes.  He has a 11.00 pack-year smoking history. He has never used smokeless tobacco. He reports that he drinks alcohol. He reports that he uses drugs, including Heroin, Other-see comments, Marijuana, and Cocaine.    Previous Medications    ABACAVIR-DOLUTEGRAVIR-LAMIVUD (TRIUMEQ) 600-50-300 MG TAB    Take 1 tablet by mouth daily.    BUPRENORPHINE HCL (SUBUTEX) 2 MG SUBL    Place 2 mg under the tongue daily.    KETOCONAZOLE (NIZORAL) 2 % CREAM    Apply  topically daily.    LISINOPRIL (PRINIVIL,ZESTRIL) 5 MG TABLET    Take 5 mg by mouth daily.              MELATONIN 3 MG TAB        QUETIAPINE (SEROQUEL) 100 MG TABLET    Take 1 tablet (100 mg total) by mouth at bedtime. In addition to daily doses    QUETIAPINE (SEROQUEL) 25 MG TABLET    Take 1 tablet (25 mg total) by mouth 2 times a day. In addition to nightly dose    THIAMINE HCL (VITAMIN B-1) 50 MG TABLET    Take 1 tablet (50 mg total) by mouth daily.    TRAZODONE (DESYREL) 100 MG TABLET    Take 100 mg by mouth at bedtime.       Allergies:   Allergies as of 02/27/2018   ??? (No Known Allergies)       Review of Systems     ROS:  10 point review of systems was reviewed and negative except as noted in the HPI.    Physical Exam     ED Triage Vitals   Vital Signs Group  Temp       Temp src       Pulse       Heart Rate Source       Resp       SpO2       BP       MAP (mmHg)       BP Location       BP Method       Patient Position    SpO2    O2 Device        General:  Awake, alert, no acute distress.     HEENT:  Normocephalic, atraumatic. Mucous membranes are moist.     Eyes: Anicteric, EOMI, PERRL, dilated     Neck:  Supple, trachea midline.      Pulmonary:   Lungs clear to auscultation bilaterally. No increased work of breathing.    Cardiac:  Regular rate and rhythm. Normal S1 and S2. No murmurs/rubs/gallops.    Abdomen:  Soft, non-tender, non-distended.     Extremities: 2+ distal pulses in upper and lower extremities. No deformity, swelling, tenderness.     Skin:  No rashes or bruising.     Neuro:  Alert and oriented x3. Speech normal. Moves UE and LE spontaneously.     Psych:  Mood and affect appropriate for situation.     Diagnostic Studies     Labs:    Labs Reviewed   BASIC METABOLIC PANEL - Abnormal; Notable for the following:        Result Value    Glucose 103 (*)     All other components within normal limits   HEPATIC FUNCTION PANEL - Abnormal; Notable for the following:     AST 197 (*)     ALT 249 (*)     All  other components within normal limits   ACETAMINOPHEN LEVEL - Abnormal; Notable for the following:     Acetaminophen Level <10 (*)     All other components within normal limits   SALICYLATE LEVEL - Abnormal; Notable for the following:     Salicylate Lvl <3 (*)     All other components within normal limits   URINALYSIS-MACROSCOPIC W/REFLEX TO MICROSCOPIC - Abnormal; Notable for the following:     pH, UA 8.5 (*)     All other components within normal limits    Narrative:     Microscopic testing is not performed when the dipstick is negative for blood, leukocyte, protein and nitrite.   CBC   DIFFERENTIAL   PROTIME-INR   ETHANOL, SERUM   URINE DRUG SCREEN WITHOUT CONFIRMATION, STAT       Radiology:    No results found.    EKG:  Ventricular Rate: ??99 ??BPM  Atrial Rate: ??99 ??BPM  P-R Interval: ??118 ??ms  QRS Duration: ??82 ??ms  QT: ??322 ??ms  QTc: ??413 ??ms  P Axis: ??71 ??degrees  R Axis: ??59 ??degrees  T Axis: ??49 ??degrees  Diagnosis Line: ??NORMAL SINUS RHYTHM ^ NORMAL ECG    Emergency Department Procedures     Procedures: none    ED Course and MDM     Alexander SCHREIFELS is a 32 y.o. male with a history and presentation as described above in HPI.  The patient was evaluated by myself, the ED Attending Physician, Dr. Tyrell Antonio, and the R4 resident, Dr. Romeo Apple. All management and disposition plans were discussed and agreed upon. Appropriate labs and diagnostic studies were reviewed as they were made available. Pertinent laboratory  studies in medical decision making are listed below.     Patient presented to the Emergency Department with Suicidal Ideation, states he took 13 100mg  tabs of Seroquel last night at 2200 as an attempt to fall asleep and not wake up.  He states that he is still suicidal today, and presented to the ED for help.  Hew as HDS and in NAD on arrival.      [facility] was contacted and recommended basic labs, but if patient is asymptomatic, he is already outside the window of observation for overdose of Seroquel.    Labs  were drawn, EKG performed and UDS and UA collected.  Pertinent results as listed below:  -EKG normal  -ASA and acetaminophen nml  -CBC and BMP normal  -UA normal  -EDS pending  -LFTs stable for patient    COWS of 10, CIWA of 7.  Patient was given Rally pack on arrival.     PSW consulted for evaluation, hold signed and recommended PES admission.     Patient deemed stable for transfer.  At this time the patient has been admitted to [facility] for further evaluation and management of SI. The patient will continue to be monitored here in the emergency department until which time he is moved to his new treatment location.    Consults:  PSW and PES    Summary of Treatment in ED:  Medications   folic acid (FOLVITE) tablet 1 mg (1 mg Oral Given 02/27/18 1719)     And   multivitamin with folic acid tablet 1 tablet (1 tablet Oral Given 02/27/18 1719)     And   thiamine mononitrate (VITAMIN B-1) tablet 100 mg (100 mg Oral Given 02/27/18 1720)       Impression     1. Suicidal ideation         Lendell Gallick A. Deloros Beretta, DMD       Alinda Money A. Ericka Pontiff, DMD  Resident  02/27/18 [phone]

## 2018-02-27 NOTE — Unmapped (Signed)
Pt given personal property and scripts. Given Catapres, Vistiral, ibuprofen, Imodium, and phenergan scripts. Given a bus pass and directions to pharmacy. Pt did c/o not being given pills. Again explained to Pt that the hospital is not able to dispense medications. Pt left without further c/o.

## 2018-02-28 ENCOUNTER — Inpatient Hospital Stay
Admit: 2018-02-28 | Discharge: 2018-02-28 | Disposition: A | Discharge status: Home / Self Care | Payer: PRIVATE HEALTH INSURANCE

## 2018-04-13 NOTE — Telephone Encounter (Signed)
Mobile # in EPIC no longer assigned to pt.  RN voicemailed pt on Home # to call back for an appointment in 1-2 mos with Dr Marlene Bast.

## 2018-04-13 NOTE — Telephone Encounter (Signed)
Please schedule Mr Alexander [name] for follow-up with me in a month or two.

## 2018-06-12 MED ORDER — TRIUMEQ 600-50-300 mg Tab
600-50-300 | ORAL_TABLET | ORAL | 5 refills | Status: AC
Start: 2018-06-12 — End: 2019-08-06

## 2018-06-12 NOTE — Telephone Encounter (Signed)
RN voicemailed pt on both mobile & home #s to call back to schedule with Dr Manson Passey.

## 2019-01-28 NOTE — Unmapped (Signed)
PA is needed for Triumeq  covermymed ZOX:WRUEAVW 600-50-300 mg Tab

## 2019-01-30 NOTE — Unmapped (Signed)
Called Mr Servantes to get schedule with Dr Manson Passey. Rec'd vm. Left call back number [phone].

## 2019-04-23 NOTE — Unmapped (Signed)
Discrete voice message for patient to call [facility] @ [phone] to schedule appointment with Dr. Manson Passey, last seen 10/22/2017.

## 2019-07-12 NOTE — Unmapped (Signed)
Reminder call for 07/14/2009 appointment with Dr. Manson Passey.   Was unable to speak with patient or leave a message.

## 2019-07-15 ENCOUNTER — Ambulatory Visit: Payer: PRIVATE HEALTH INSURANCE

## 2019-08-02 NOTE — Unmapped (Addendum)
Patient Name: Alexander [name] / MRN: [id number]    Patient Date of Birth: Mar 02, 1986    Call Back Number: [phone]    Patient of: Dr. Laurey Morale of Call: Critical Evaluation of HIV results    On Call Provider Contacted: Dr. Landis Gandy Dr.Hahn     Provider Contacted via Pager or Cell: Pager [phone] / Pager [phone]     Time and Outcome of Encounter:  12:41am EST, 1:13am EST paged for a second time. Paged Dr. Luciano Cutter @1 :44am EST due to note prompted in Epic regarding the routing to and status of Dr. Landis Gandy, awaiting a call back.    Caller has been advised to call back in 30 minutes to request a status update; if they have not received a call back. The encounter???s telephone note/documentation has been routed to the On Call provider/pool in which the patient/caregiver was connected with.

## 2019-08-06 ENCOUNTER — Ambulatory Visit: Admit: 2019-08-06 | Payer: PRIVATE HEALTH INSURANCE

## 2019-08-06 DIAGNOSIS — B20 Human immunodeficiency virus [HIV] disease: Secondary | ICD-10-CM

## 2019-08-06 MED ORDER — abacavir-dolutegravir-lamivud (TRIUMEQ) 600-50-300 mg Tab
600-50-300 | ORAL_TABLET | Freq: Every day | ORAL | 5 refills | Status: AC
Start: 2019-08-06 — End: 2020-03-27

## 2019-08-06 MED ORDER — proMETHazine (PHENERGAN) 25 MG tablet
25 | ORAL_TABLET | Freq: Four times a day (QID) | ORAL | 4 refills | Status: AC | PRN
Start: 2019-08-06 — End: 2020-02-01

## 2019-08-06 NOTE — Unmapped (Signed)
Patient ID: Alexander [name] is a 33 y.o. male.    Chief Complaint   Patient presents with   ??? HIV Positive/AIDS        History of Present Illness  Alexander [name] is a 33 y.o. male diagnosed with HIV on 02/05/17. His initial IDC visit was on 02/20/17. He has not been diagnosed with AIDS. His CD4 nadir was 218 on 02/20/17. His self reported risk behavior for acquiring HIV was IDU. He has not experienced prior antiretroviral treatment failure.     Today is his first visit to see me. He  has not been taking his medications for 4 months and was taking them only intermittently prior to stopping them.. His most recent CD4 count and HIV viral load are as follows:  Lab Results   Component Value Date    ABSCD4HELP 509.0 09/15/2017     Lab Results   Component Value Date    CD4POSLY 44.9 09/15/2017     Lab Results   Component Value Date    HIV1COP 29 09/15/2017     Patient Active Problem List   Diagnosis   ??? HIV - Diagnosed 02/05/17 - has been treated with Triumeq.  He reports that he initially responded well to treatment and he did achieve suppressed viral load in 2018.  He reports that he subsequently began taking his meds intermittently due to drug use and that he has been off meds altogether for the past 4 months.  He is now following with addiction sciences and wants to resume therapy.  He previously tolerated Triumeq but had some initial nausea.    ??? Bipolar affective disorder, depressed - not currently on meds.     ??? Polysubstance abuse - patient reports last use was yesterday.  He indicates that he is tapering off opioids.   ??? Chronic hepatitis C without hepatic coma - last HCV RNA 1,308,657 on 05/16/2017.  Interested in treatment.  Hep B immune.  Hep A non-immune as of 02/20/2017.           Histories  He has a past medical history of Anxiety, Bipolar disorder (CMS Dx), Bronchitis, Depression, Drug abuse and dependence (CMS Dx), H/O tooth extraction, Hepatitis C, Liver disease (Hep C), PTSD (post-traumatic stress disorder),  Substance induced mood disorder (CMS Dx), and Thyroid disease.    He has a past surgical history that includes Mandible surgery.    His family history includes Breast Cancer in his father; Cancer in his father; Drug abuse in his brother and brother; Hypertension in his father and mother; Seizures in his father; Suicidality in his brother, brother, and father; Transient ischemic attack in his mother.    He reports that he has been smoking cigarettes. He has a 11.00 pack-year smoking history. He has never used smokeless tobacco. He reports current alcohol use. He reports current drug use. Drugs: Heroin, Other-see comments, Marijuana, and Cocaine.    Allergies  Patient has no known allergies.    Medications  Outpatient Encounter Medications as of 08/06/2019   Medication Sig Dispense Refill   ??? abacavir-dolutegravir-lamivud (TRIUMEQ) 600-50-300 mg Tab Take 1 tablet by mouth daily. 30 tablet 5   ??? buprenorphine HCl (SUBUTEX) 2 mg Subl Place 2 mg under the tongue daily.     ??? cloNIDine HCl (CATAPRES) 0.1 MG tablet Take 1 tablet (0.1 mg total) by mouth 3 times a day as needed (anxiety). 12 tablet 0   ??? hydrOXYzine (VISTARIL) 100 MG capsule Take 1 capsule (100 mg total)  by mouth 4 times a day as needed for Anxiety. Indications: anxiety 10 capsule 0   ??? ibuprofen (ADVIL,MOTRIN) 600 MG tablet Take 1 tablet (600 mg total) by mouth every 6 hours as needed for Pain. 12 tablet 0   ??? ketoconazole (NIZORAL) 2 % cream Apply topically daily.     ??? lisinopril (PRINIVIL,ZESTRIL) 5 MG tablet Take 5 mg by mouth daily.               ??? loperamide (IMODIUM) 2 mg capsule Take 1 capsule (2 mg total) by mouth 4 times a day as needed for Diarrhea. 16 capsule 0   ??? melatonin 3 mg Tab      ??? proMETHazine (PHENERGAN) 25 MG tablet Take 1 tablet (25 mg total) by mouth every 6 hours as needed for Nausea. 12 tablet 4   ??? thiamine HCl (VITAMIN B-1) 50 MG tablet Take 1 tablet (50 mg total) by mouth daily. 30 tablet 3   ??? traZODone (DESYREL) 100 MG tablet  Take 100 mg by mouth at bedtime.     ??? [DISCONTINUED] proMETHazine (PHENERGAN) 25 MG tablet Take 1 tablet (25 mg total) by mouth every 6 hours as needed for Nausea. 12 tablet 0   ??? [DISCONTINUED] TRIUMEQ 600-50-300 mg Tab TAKE ONE TABLET BY MOUTH DAILY 30 tablet 5     No facility-administered encounter medications on file as of 08/06/2019.         Review of Systems   Constitutional: Positive for diaphoresis, fatigue and weight loss. Negative for chills and fever.   HENT: Negative for congestion and rhinorrhea.    Eyes: Negative for pain and visual disturbance.   Respiratory: Negative for cough, shortness of breath and wheezing.    Cardiovascular: Negative for chest pain, palpitations and leg swelling.   Gastrointestinal: Negative for abdominal distention, abdominal pain, diarrhea, nausea and vomiting.   Genitourinary: Negative for difficulty urinating and dysuria.   Musculoskeletal: Negative for back pain, myalgias and neck pain.   Skin: Negative for rash and wound.   Neurological: Negative for weakness and numbness.   Hematological: Negative for adenopathy.   Psychiatric/Behavioral: Negative for behavioral problems, confusion, dysphoric mood and suicidal ideas. The patient is not nervous/anxious.        Vitals  There were no vitals taken for this visit.    Physical Exam  Vitals signs and nursing note reviewed.   Constitutional:       General: He is not in acute distress.     Appearance: He is well-developed. He is not diaphoretic.   HENT:      Head: Normocephalic and atraumatic.      Mouth/Throat:      Pharynx: No oropharyngeal exudate.   Eyes:      General: No scleral icterus.        Right eye: No discharge.         Left eye: No discharge.      Conjunctiva/sclera: Conjunctivae normal.      Pupils: Pupils are equal, round, and reactive to light.   Neck:      Musculoskeletal: Normal range of motion and neck supple.      Thyroid: No thyromegaly.      Vascular: No JVD.      Trachea: No tracheal deviation.    Cardiovascular:      Rate and Rhythm: Normal rate and regular rhythm.      Heart sounds: Normal heart sounds. No murmur. No friction rub. No gallop.    Pulmonary:  Effort: Pulmonary effort is normal. No respiratory distress.      Breath sounds: Normal breath sounds. No stridor. No wheezing or rales.   Chest:      Chest wall: No tenderness.   Abdominal:      General: There is no distension.      Palpations: Abdomen is soft. There is no mass.      Tenderness: There is no abdominal tenderness. There is no guarding or rebound.   Musculoskeletal: Normal range of motion.         General: No tenderness.   Lymphadenopathy:      Cervical: No cervical adenopathy.   Skin:     General: Skin is warm and dry.      Coloration: Skin is not pale.      Findings: No erythema or rash.   Neurological:      Mental Status: He is alert and oriented to person, place, and time.      Cranial Nerves: No cranial nerve deficit.      Motor: No abnormal muscle tone.      Deep Tendon Reflexes: Reflexes are normal and symmetric. Reflexes normal.   Psychiatric:         Behavior: Behavior normal.         Thought Content: Thought content normal.         Judgment: Judgment normal.         Review of Lab Results  Lab Results   Component Value Date    HGB 14.0 02/27/2018    HCT 40.3 02/27/2018    WBC 6.6 02/27/2018    PLT 188 02/27/2018     Lab Results   Component Value Date    NEUTOPHILPCT 77.2 02/27/2018    LYMPHOPCT 16.4 02/27/2018    MONOPCT 5.6 02/27/2018    EOSPCT 0.4 02/27/2018    BASOPCT 0.4 02/27/2018    NEUTROABS 5,095 02/27/2018    LYMPHSABS 1,082 02/27/2018    MONOSABS 370 02/27/2018    EOSABS 26 02/27/2018    BASOSABS 26 02/27/2018     Lab Results   Component Value Date    NA 142 02/27/2018    K 4.7 02/27/2018    CL 109 02/27/2018    CO2 25 02/27/2018    GLUCOSE 103 (H) 02/27/2018    BUN 15 02/27/2018    CREATININE 0.81 02/27/2018    MG 2.0 02/05/2017    PHOS 4.5 10/17/2017     Lab Results   Component Value Date    ALKPHOS 96 02/27/2018     AST 197 (H) 02/27/2018    ALT 249 (H) 02/27/2018    ALBUMIN 4.0 02/27/2018    BILIDIRECT 0.1 02/27/2018    BILITOT 0.2 02/27/2018    PROT 6.8 02/27/2018     Lab Results   Component Value Date    CHOLTOT 118 02/20/2017    TRIG 110 02/20/2017    HDL 35 (L) 02/20/2017    LDL 61 02/20/2017     Lab Results   Component Value Date    COLORU Yellow 02/27/2018    CLARITYU Clear 02/27/2018    LABSPEC 1.020 02/27/2018    PHUR 8.5 (H) 02/27/2018    PROTEINUA Negative 02/27/2018    GLUCOSEU Negative 02/27/2018    KETONESU Negative 02/27/2018    BILIRUBINUR Negative 02/27/2018    BLOODU Negative 02/27/2018    NITRITE Negative 02/27/2018    UROBILINOGEN 0.2 E.U./dL 16/09/9603    LEUKOCYTESUR Negative 02/27/2018    RBCUA 5 (H)  11/13/2013    WBCUA 3 11/13/2013    BACTERIA Rare (A) 11/13/2013    MUCUS Present (A) 11/13/2013     Lab Results   Component Value Date    ABSCD4HELP 509.0 09/15/2017    CD4POSLY 44.9 09/15/2017     Lab Results   Component Value Date    HIV1COP 29 09/15/2017     Lab Results   Component Value Date    HEPAIGG Nonreactive 12/12/2013     Lab Results   Component Value Date    HEPBSAG Nonreactive 02/20/2017    HBSAB Reactive (A) 02/20/2017    HBSABNUMBER >1000.00 (H) 02/20/2017    HEPBCAB Nonreactive 02/20/2017     Lab Results   Component Value Date    HCVAB Reactive (A) 02/20/2017    IU 1,610,960 05/16/2017     Lab Results   Component Value Date    TREPIA Negative 02/20/2017    RPRMONSCRN Non-Reactive 11/15/2013     Lab Results   Component Value Date    [facility] Negative 02/20/2017    GCNAA Negative 02/20/2017     Lab Results   Component Value Date    QUANTIFERTB NEGATIVE 02/20/2017             Assessment/Plan  HIV infection (CMS Dx)  Off Triumeq x 4 months.  His last CD4 count was 509.0 on 09/15/2017 and his last viral load was 29 on 09/15/2017.  He reports he tolerated Triumeq previously with some initial nausea..  We discussed the following plan today:  ??? Labs ordered today.  ??? Reviewed medications for  drug-drug interactions.  ??? Reviewed labs/test results with patient.  ??? Counseling provided on adherence & goals of therapy.  ??? Reviewed prevention of transmission/ risk reduction.  ??? Reviewed monitoring of HIV disease.  ??? Mental Healh and Substance abuse screening performed and reviewed today.  ??? Resume Triumeq with promethazine as needed for nausea.       Bipolar disorder (CMS Dx)  Not currently on meds, beginning addiction treatment program.    Polysubstance abuse  Last use yesterday, tapering opioids.  ?? Follow-up with [facility].    Chronic hepatitis C without hepatic coma (CMS Dx)  HCV RNA 4,540,981 on 05/16/2017.  Fibroscan c/w F0/F1 on 10/19/17.  Interested in therapy.  Hep B immune.  Hep A non-immune.    ?? Will consider treatment once he is reestablished on HIV meds.  ?? Will likely need a 6 month period of sobriety.  ?? Will reassess hep A IgG - would immunize if Hep A IgG remains negative

## 2019-08-06 NOTE — Assessment & Plan Note (Signed)
Last use yesterday, tapering opioids.   Follow-up with addiction [facility].

## 2019-08-06 NOTE — Patient Instructions (Signed)
1. Resume Triumeq  2. Labs ordered today  3. Phenergan for nausea as needed.  4. Follow-up in 2 months.

## 2019-08-06 NOTE — Unmapped (Signed)
Off Triumeq x 4 months.  His last CD4 count was 509.0 on 09/15/2017 and his last viral load was 29 on 09/15/2017.  He reports he tolerated Triumeq previously with some initial nausea..  We discussed the following plan today:  ??? Labs ordered today.  ??? Reviewed medications for drug-drug interactions.  ??? Reviewed labs/test results with patient.  ??? Counseling provided on adherence & goals of therapy.  ??? Reviewed prevention of transmission/ risk reduction.  ??? Reviewed monitoring of HIV disease.  ??? Mental Healh and Substance abuse screening performed and reviewed today.  ??? Resume Triumeq with promethazine as needed for nausea.

## 2019-08-06 NOTE — Assessment & Plan Note (Signed)
Not currently on meds, beginning addiction treatment program.

## 2019-08-06 NOTE — Unmapped (Signed)
HCV RNA 1,610,960 on 05/16/2017.  Fibroscan c/w F0/F1 on 10/19/17.  Interested in therapy.  Hep B immune.  Hep A non-immune.    ?? Will consider treatment once he is reestablished on HIV meds.  ?? Will likely need a 6 month period of sobriety.  ?? Will reassess hep A IgG - would immunize if Hep A IgG remains negative

## 2019-08-14 NOTE — Telephone Encounter (Signed)
Left VM for patient asking him to please call [phone] to schedule appointment per Referral from Dr. Luciano Cutter.  Patient last saw Dr. Manson Passey 10/17/2017.  As this is under 2 years patient does not need another Intake.

## 2019-08-15 NOTE — Telephone Encounter (Signed)
VM left for patient to call [facility] @ [phone] to schedule resumption of care appt. Last seen by Dr. Manson Passey 10/17/17, message also left for Wendi Maya, CM @ [phone].

## 2019-08-27 ENCOUNTER — Ambulatory Visit: Payer: PRIVATE HEALTH INSURANCE

## 2019-08-27 NOTE — Patient Instructions (Addendum)
1. Will check with insurance to see if we can authorize an early refill of meds.  2. Get labs drawn this week  3. Follow-up as scheduled.

## 2019-08-27 NOTE — Assessment & Plan Note (Addendum)
Off Triumeq for almost 5 months.  His last CD4 count was 509.0 on 09/15/2017 and his last viral load was 29 on 09/15/2017.  He reports that he picked up his meds on 08/06/19 and then promptly lost them when he left his bag on the bus on the way home.   He reports he tolerated Triumeq previously with some initial nausea..  We discussed the following plan today:   Reviewed medications for drug-drug interactions.   Reviewed labs/test results with patient.   Counseling provided on adherence & goals of therapy.   Reviewed prevention of transmission/ risk reduction.   Encouraged patient to get his labs drawn.    Will investigate whether an early refill can be authorized - otherwise plan to restart meds when he can get them filled again with insurance on 09/05/19.   Will plan for follow-up again as scheduled on 10/01/19.

## 2019-08-27 NOTE — Unmapped (Signed)
Patient ID: Alexander [name] is a 33 y.o. male.    Chief complaint: HIV - lost meds     History of Present Illness  Alexander [name] is a 33 y.o. male diagnosed with HIV on 02/05/17. His initial IDC visit was on 02/20/17. He has not been diagnosed with AIDS. His CD4 nadir was 218 on 02/20/17. His self reported risk behavior for acquiring HIV was IDU. He has not experienced prior antiretroviral treatment failure.     His last visit to see me was on 08/06/2019. He  has not been taking his medications for 5 months and was taking them only intermittently prior to stopping them.   His most recent CD4 count and HIV viral load are as follows:  Lab Results   Component Value Date    ABSCD4HELP 509.0 09/15/2017     Lab Results   Component Value Date    CD4POSLY 44.9 09/15/2017     Lab Results   Component Value Date    HIV1COP 29 09/15/2017     Patient Active Problem List   Diagnosis   ??? HIV - Diagnosed 02/05/17 - has been treated with Triumeq.  He reports that he initially responded well to treatment and he did achieve suppressed viral load in 2018.  He reports that he subsequently began taking his meds intermittently due to drug use and that he has been off meds altogether for the past 5 months.  He is now following with addiction [facility] and wants to resume therapy.  He previously tolerated Triumeq but had some initial nausea.     He was seen on 08/06/19 and prescribed Triumeq which he picked up that day.  Unfortunately he promptly lost this when he left his bag on the bus on the way home.           Histories  He has a past medical history of Anxiety, Bipolar disorder (CMS Dx), Bronchitis, Depression, Drug abuse and dependence (CMS Dx), H/O tooth extraction, Hepatitis C, Liver disease (Hep C), PTSD (post-traumatic stress disorder), Substance induced mood disorder (CMS Dx), and Thyroid disease.    He has a past surgical history that includes Mandible surgery.    His family history includes Breast Cancer in his father; Cancer in his  father; Drug abuse in his brother and brother; Hypertension in his father and mother; Seizures in his father; Suicidality in his brother, brother, and father; Transient ischemic attack in his mother.    He reports that he has been smoking cigarettes. He has a 11.00 pack-year smoking history. He has never used smokeless tobacco. He reports current alcohol use. He reports current drug use. Drugs: Heroin, Other-see comments, Marijuana, and Cocaine.    Allergies  Patient has no known allergies.    Medications  Outpatient Encounter Medications as of 08/27/2019   Medication Sig Dispense Refill   ??? abacavir-dolutegravir-lamivud (TRIUMEQ) 600-50-300 mg Tab Take 1 tablet by mouth daily. 30 tablet 5   ??? buprenorphine HCl (SUBUTEX) 2 mg Subl Place 2 mg under the tongue daily.     ??? cloNIDine HCl (CATAPRES) 0.1 MG tablet Take 1 tablet (0.1 mg total) by mouth 3 times a day as needed (anxiety). 12 tablet 0   ??? hydrOXYzine (VISTARIL) 100 MG capsule Take 1 capsule (100 mg total) by mouth 4 times a day as needed for Anxiety. Indications: anxiety 10 capsule 0   ??? ibuprofen (ADVIL,MOTRIN) 600 MG tablet Take 1 tablet (600 mg total) by mouth every 6 hours as needed for  Pain. 12 tablet 0   ??? ketoconazole (NIZORAL) 2 % cream Apply topically daily.     ??? lisinopril (PRINIVIL,ZESTRIL) 5 MG tablet Take 5 mg by mouth daily.               ??? loperamide (IMODIUM) 2 mg capsule Take 1 capsule (2 mg total) by mouth 4 times a day as needed for Diarrhea. 16 capsule 0   ??? melatonin 3 mg Tab      ??? proMETHazine (PHENERGAN) 25 MG tablet Take 1 tablet (25 mg total) by mouth every 6 hours as needed for Nausea. 12 tablet 4   ??? thiamine HCl (VITAMIN B-1) 50 MG tablet Take 1 tablet (50 mg total) by mouth daily. 30 tablet 3   ??? traZODone (DESYREL) 100 MG tablet Take 100 mg by mouth at bedtime.       No facility-administered encounter medications on file as of 08/27/2019.         Review of Systems   Constitutional: Positive for diaphoresis, fatigue and weight  loss. Negative for chills and fever.   HENT: Negative for congestion and rhinorrhea.    Eyes: Negative for pain and visual disturbance.   Respiratory: Negative for cough, shortness of breath and wheezing.    Cardiovascular: Negative for chest pain, palpitations and leg swelling.   Gastrointestinal: Negative for abdominal distention, abdominal pain, diarrhea, nausea and vomiting.   Genitourinary: Negative for difficulty urinating and dysuria.   Musculoskeletal: Negative for back pain, myalgias and neck pain.   Skin: Negative for rash and wound.   Neurological: Negative for weakness and numbness.   Hematological: Negative for adenopathy.   Psychiatric/Behavioral: Negative for behavioral problems, confusion, dysphoric mood and suicidal ideas. The patient is not nervous/anxious.        Vitals  There were no vitals taken for this visit.    Physical Exam  Vitals signs and nursing note reviewed.   Constitutional:       General: He is not in acute distress.     Appearance: He is well-developed. He is not diaphoretic.   HENT:      Head: Normocephalic and atraumatic.      Mouth/Throat:      Pharynx: No oropharyngeal exudate.   Eyes:      General: No scleral icterus.        Right eye: No discharge.         Left eye: No discharge.      Conjunctiva/sclera: Conjunctivae normal.      Pupils: Pupils are equal, round, and reactive to light.   Neck:      Musculoskeletal: Normal range of motion and neck supple.      Thyroid: No thyromegaly.      Vascular: No JVD.      Trachea: No tracheal deviation.   Cardiovascular:      Rate and Rhythm: Normal rate and regular rhythm.      Heart sounds: Normal heart sounds. No murmur. No friction rub. No gallop.    Pulmonary:      Effort: Pulmonary effort is normal. No respiratory distress.      Breath sounds: Normal breath sounds. No stridor. No wheezing or rales.   Chest:      Chest wall: No tenderness.   Abdominal:      General: There is no distension.      Palpations: Abdomen is soft. There is  no mass.      Tenderness: There is no abdominal tenderness. There is no  guarding or rebound.   Musculoskeletal: Normal range of motion.         General: No tenderness.   Lymphadenopathy:      Cervical: No cervical adenopathy.   Skin:     General: Skin is warm and dry.      Coloration: Skin is not pale.      Findings: No erythema or rash.   Neurological:      Mental Status: He is alert and oriented to person, place, and time.      Cranial Nerves: No cranial nerve deficit.      Motor: No abnormal muscle tone.      Deep Tendon Reflexes: Reflexes are normal and symmetric. Reflexes normal.   Psychiatric:         Behavior: Behavior normal.         Thought Content: Thought content normal.         Judgment: Judgment normal.         Review of Lab Results  Lab Results   Component Value Date    HGB 14.0 02/27/2018    HCT 40.3 02/27/2018    WBC 6.6 02/27/2018    PLT 188 02/27/2018     Lab Results   Component Value Date    NEUTOPHILPCT 77.2 02/27/2018    LYMPHOPCT 16.4 02/27/2018    MONOPCT 5.6 02/27/2018    EOSPCT 0.4 02/27/2018    BASOPCT 0.4 02/27/2018    NEUTROABS 5,095 02/27/2018    LYMPHSABS 1,082 02/27/2018    MONOSABS 370 02/27/2018    EOSABS 26 02/27/2018    BASOSABS 26 02/27/2018     Lab Results   Component Value Date    NA 142 02/27/2018    K 4.7 02/27/2018    CL 109 02/27/2018    CO2 25 02/27/2018    GLUCOSE 103 (H) 02/27/2018    BUN 15 02/27/2018    CREATININE 0.81 02/27/2018    MG 2.0 02/05/2017    PHOS 4.5 10/17/2017     Lab Results   Component Value Date    ALKPHOS 96 02/27/2018    AST 197 (H) 02/27/2018    ALT 249 (H) 02/27/2018    ALBUMIN 4.0 02/27/2018    BILIDIRECT 0.1 02/27/2018    BILITOT 0.2 02/27/2018    PROT 6.8 02/27/2018     Lab Results   Component Value Date    CHOLTOT 118 02/20/2017    TRIG 110 02/20/2017    HDL 35 (L) 02/20/2017    LDL 61 02/20/2017     Lab Results   Component Value Date    COLORU Yellow 02/27/2018    CLARITYU Clear 02/27/2018    LABSPEC 1.020 02/27/2018    PHUR 8.5 (H) 02/27/2018     PROTEINUA Negative 02/27/2018    GLUCOSEU Negative 02/27/2018    KETONESU Negative 02/27/2018    BILIRUBINUR Negative 02/27/2018    BLOODU Negative 02/27/2018    NITRITE Negative 02/27/2018    UROBILINOGEN 0.2 E.U./dL 45/40/9811    LEUKOCYTESUR Negative 02/27/2018    RBCUA 5 (H) 11/13/2013    WBCUA 3 11/13/2013    BACTERIA Rare (A) 11/13/2013    MUCUS Present (A) 11/13/2013     Lab Results   Component Value Date    ABSCD4HELP 509.0 09/15/2017    CD4POSLY 44.9 09/15/2017     Lab Results   Component Value Date    HIV1COP 29 09/15/2017     Lab Results   Component Value Date    HEPAIGG  Nonreactive 12/12/2013     Lab Results   Component Value Date    HEPBSAG Nonreactive 02/20/2017    HBSAB Reactive (A) 02/20/2017    HBSABNUMBER >1000.00 (H) 02/20/2017    HEPBCAB Nonreactive 02/20/2017     Lab Results   Component Value Date    HCVAB Reactive (A) 02/20/2017    IU 0,981,191 05/16/2017     Lab Results   Component Value Date    TREPIA Negative 02/20/2017    RPRMONSCRN Non-Reactive 11/15/2013     Lab Results   Component Value Date    [facility] Negative 02/20/2017    GCNAA Negative 02/20/2017     Lab Results   Component Value Date    QUANTIFERTB NEGATIVE 02/20/2017             Assessment/Plan  HIV infection (CMS Dx)  Off Triumeq for almost 5 months.  His last CD4 count was 509.0 on 09/15/2017 and his last viral load was 29 on 09/15/2017.  He reports that he picked up his meds on 08/06/19 and then promptly lost them when he left his bag on the bus on the way home.   He reports he tolerated Triumeq previously with some initial nausea..  We discussed the following plan today:  ??? Reviewed medications for drug-drug interactions.  ??? Reviewed labs/test results with patient.  ??? Counseling provided on adherence & goals of therapy.  ??? Reviewed prevention of transmission/ risk reduction.  ??? Encouraged patient to get his labs drawn.   ??? Will investigate whether an early refill can be authorized - otherwise plan to restart meds when he  can get them filled again with insurance on 09/05/19.  ??? Will plan for follow-up again as scheduled on 10/01/19.        The HPI, ROS, physical exam, test results, and assessment & plan were reviewed and copied forward (with edits) from a note written by me on 08/06/19. I have reviewed and updated the history, physical exam, data, assessment, and plan of the note so that it reflects my evaluation and management of the patient.

## 2019-08-28 ENCOUNTER — Other Ambulatory Visit: Admit: 2019-08-28 | Payer: PRIVATE HEALTH INSURANCE

## 2019-08-28 DIAGNOSIS — B20 Human immunodeficiency virus [HIV] disease: Secondary | ICD-10-CM

## 2019-08-28 LAB — HEPATITIS A ANTIBODY TOTAL: Hep A IgG: REACTIVE

## 2019-08-28 LAB — RENAL FUNCTION PANEL W/EGFR
Albumin: 4 g/dL (ref 3.5–5.7)
Anion Gap: 10 mmol/L (ref 3–16)
BUN: 11 mg/dL (ref 7–25)
CO2: 26 mmol/L (ref 21–33)
Calcium: 8.9 mg/dL (ref 8.6–10.3)
Chloride: 101 mmol/L (ref 98–110)
Creatinine: 0.65 mg/dL (ref 0.60–1.30)
Glucose: 104 mg/dL (ref 70–100)
Osmolality, Calculated: 284 mOsm/kg (ref 278–305)
Phosphorus: 4 mg/dL (ref 2.1–4.7)
Potassium: 4.2 mmol/L (ref 3.5–5.3)
Sodium: 137 mmol/L (ref 133–146)
eGFR AA CKD-EPI: >90 See note.
eGFR NONAA CKD-EPI: >90 See note.

## 2019-08-28 LAB — HEPATITIS C RNA, QUANTITATIVE REFLEX TO GENOTYPING
IU log10: 6.56 log 10 IU/mL
International Units: 3638097 IU/mL

## 2019-08-28 LAB — VITAMIN D 25 HYDROXY: Vit D, 25-Hydroxy: 33.7 ng/mL (ref 30.0–100.0)

## 2019-08-28 LAB — TREPONEMA PALLIDUM AB WITH REFLEX: Treponema Pallidum: NEGATIVE

## 2019-08-28 LAB — CBC
Hematocrit: 42.5 % (ref 38.5–50.0)
Hemoglobin: 14.1 g/dL (ref 13.2–17.1)
MCH: 26.3 pg (ref 27.0–33.0)
MCHC: 33.1 g/dL (ref 32.0–36.0)
MCV: 79.4 fL (ref 80.0–100.0)
MPV: 7.9 fL (ref 7.5–11.5)
Platelets: 200 10*3/uL (ref 140–400)
RBC: 5.35 10*6/uL (ref 4.20–5.80)
RDW: 18.2 % (ref 11.0–15.0)
WBC: 4.4 10*3/uL (ref 3.8–10.8)

## 2019-08-28 LAB — LYMPHOCYTE SUBSET FOLLOWUP
% CD 3 Pos. Lymph.: 92.3 % (ref 56.0–89.0)
% CD 4 Pos. Lymph.: 34.3 % (ref 30.0–64.0)
% CD 8 Pos. Lymph.: 50.2 % (ref 6.0–43.0)
Absolute CD 3: 805 Cells/uL (ref 870.0–2532.0)
Absolute CD 4 Helper: 301 Cells/uL (ref 255.0–2496.0)
Absolute CD 8 (Supp): 441 Cells/uL (ref 155.0–1010.0)
CD19 % B Cell: 2.3 % (ref 1.0–28.0)
CD19 Abs: <35 Cells/uL (ref 24.0–683.0)
CD4/CD8 Ratio: 0.68 ratio (ref 1.10–3.70)

## 2019-08-28 LAB — DIFFERENTIAL
Basophils Absolute: 26 /uL (ref 0–200)
Basophils Relative: 0.6 % (ref 0.0–1.0)
Eosinophils Absolute: 123 /uL (ref 15–500)
Eosinophils Relative: 2.8 % (ref 0.0–8.0)
Lymphocytes Absolute: 1021 /uL (ref 850–3900)
Lymphocytes Relative: 23.2 % (ref 15.0–45.0)
Monocytes Absolute: 471 /uL (ref 200–950)
Monocytes Relative: 10.7 % (ref 0.0–12.0)
Neutrophils Absolute: 2759 /uL (ref 1500–7800)
Neutrophils Relative: 62.7 % (ref 40.0–80.0)
nRBC: 0 /100 WBC (ref 0–0)

## 2019-08-28 LAB — LIPID PANEL
Cholesterol, Total: 142 mg/dL (ref 0–200)
HDL: 42 mg/dL (ref 60–92)
LDL Cholesterol: 84 mg/dL
Triglycerides: 82 mg/dL (ref 10–149)

## 2019-08-28 LAB — HEPATITIS C RNA, RNA GENOTYPING ONLY: Hepatitis C Genotype: 3

## 2019-08-28 LAB — HEPATIC FUNCTION PANEL
ALT: 143 U/L (ref 7–52)
AST: 210 U/L (ref 13–39)
Albumin: 4 g/dL (ref 3.5–5.7)
Alkaline Phosphatase: 122 U/L (ref 36–125)
Bilirubin, Direct: 0.15 mg/dL (ref 0.00–0.40)
Bilirubin, Indirect: 0.35 mg/dL (ref 0.00–1.10)
Total Bilirubin: 0.5 mg/dL (ref 0.0–1.5)
Total Protein: 8.3 g/dL (ref 6.4–8.9)

## 2019-08-28 LAB — HEMOGLOBIN A1C: Hemoglobin A1C: 5.4 % (ref 4.0–5.6)

## 2019-08-28 NOTE — Unmapped (Deleted)
-----   Message from Rose M Burdine sent at 08/28/2019 11:32 AM EDT -----  Regarding: Referral # [id number]  Have made 3 attempts to reach patient to schedule appointment with Dr. Brown before 10/18/2019.  After that date patient will need another Intake.  Left VM for [facility] Michael Volmer also.

## 2019-08-28 NOTE — Telephone Encounter (Signed)
Alexander [name] returned my call.  Alexander [name] stated they have been trying to locate patient also.  Patient is living in [facility] and his case manager there is Alexander [name] [phone].  I called and spoke with her and she informed me that Alexander [name] does have housing there but doesn't use it often.  They have been trying to locate him.  Alexander [name] stated that she will inform patient that he needs to call and make an appointment with Alexander [name], when she see him.

## 2019-08-28 NOTE — Telephone Encounter (Signed)
Left Vm for patient's CM Micheal Volmer to please call [facility] to set up a resumption appointment with Dr. Manson Passey.  Patient was last seen on 10/17/2017 by Dr. Manson Passey.  As this in under 2+ years he does not need another Intake.

## 2019-08-28 NOTE — Unmapped (Signed)
-----   Message from Shayne Alken Burdine sent at 08/28/2019 11:32 AM EDT -----  Regarding: Referral # [id number]  Have made 3 attempts to reach patient to schedule appointment with Dr. Manson Passey before 10/18/2019.  After that date patient will need another Intake.  Left VM for [facility] Wendi Maya also.

## 2019-09-13 NOTE — Telephone Encounter (Signed)
09/02/2019 [facility] CM, Stephannie Peters, called and scheduled EST appointment for patient on 09/27/2019 @ 9:00 with Dr. Manson Passey.  NPI Resumption waived.  Patient was last seen by Dr. Manson Passey 08/11/2017.

## 2019-09-25 NOTE — Telephone Encounter (Signed)
Attempted to contact pt regarding appt on 10/23, but no answer.

## 2019-09-27 ENCOUNTER — Ambulatory Visit: Payer: PRIVATE HEALTH INSURANCE

## 2019-10-01 ENCOUNTER — Ambulatory Visit: Payer: PRIVATE HEALTH INSURANCE

## 2019-11-12 ENCOUNTER — Ambulatory Visit: Admit: 2019-11-12 | Discharge: 2019-11-12 | Payer: PRIVATE HEALTH INSURANCE

## 2019-11-12 DIAGNOSIS — B20 Human immunodeficiency virus [HIV] disease: Secondary | ICD-10-CM

## 2019-11-12 NOTE — Unmapped (Signed)
Patient ID: Alexander [name] is a 33 y.o. male.    Chief complaint: HIV Follow-up     History of Present Illness  [name] is a 33 y.o. male diagnosed with HIV on  . His initial IDC visit was on  . He has not been diagnosed with AIDS. His CD4 nadir was   on  . His self reported risk behavior for acquiring HIV was  . He has not experienced prior antiretroviral treatment failure.     His last visit to see me was on 08/27/2019. He  has missed no dose(s) of antiretrovirals in the last week and reports no side effects from medications.  He was previously off meds for 5 months.  Resumed meds in September but lost them on the bus. He has now been consistently back on his medications for about 3 weeks.   His most recent CD4 count and HIV viral load are as follows:  Lab Results   Component Value Date    ABSCD4HELP 301.0 08/28/2019     Lab Results   Component Value Date    CD4POSLY 34.3 08/28/2019     Lab Results   Component Value Date    HIV1COP 29 09/15/2017     Patient Active Problem List   Diagnosis   ??? HIV - Diagnosed 02/05/17 - has been treated with Triumeq.  He reports that he initially responded well to treatment and he did achieve suppressed viral load in 2018.  He reports that he subsequently began taking his meds intermittently due to drug use and that he has been off meds altogether from April through September 2020.  He is now following with addiction [facility] and resumed Triumeq in September but lost his meds on the bus.  He reports he has been consistently on his Triumeq for about 3 weeks.  In this time he has been feeling better overall and denies side effects from Triumeq.  Had labs done on 9/23 which showed CD4 of 301 but HIV-1 viral load was cancelled by the lab - likely due to improperly drawn specimen or insufficient blood quantity.   ??? Hepatitis C - LFTs elevated in 200s range.  Had Hep C RNA of [id number] on 08/28/2019.  Had Fiboscan - 10/19/17 which was c/w F0-F1.  Still drinking alcohol although he is  trying to quit.  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  We reviewed the contribution of alcohol to his liver disease and the greatly increased risk of developing cirrhosis with the combination of HIV, hepatitis C, and alcohol use.           Histories  He has a past medical history of Anxiety, Bipolar disorder (CMS Dx), Bronchitis, Depression, Drug abuse and dependence (CMS Dx), H/O tooth extraction, Hepatitis C, Liver disease (Hep C), PTSD (post-traumatic stress disorder), Substance induced mood disorder (CMS Dx), and Thyroid disease.    He has a past surgical history that includes Mandible surgery.    His family history includes Breast Cancer in his father; Cancer in his father; Drug abuse in his brother and brother; Hypertension in his father and mother; Seizures in his father; Suicidality in his brother, brother, and father; Transient ischemic attack in his mother.    He reports that he has been smoking cigarettes. He has a 11.00 pack-year smoking history. He has never used smokeless tobacco. He reports current alcohol use. He reports  current drug use. Drugs: Heroin, Other-see comments, Marijuana, and Cocaine.    Allergies  Patient has no known allergies.    Medications  Outpatient Encounter Medications as of 11/12/2019   Medication Sig Dispense Refill   ??? abacavir-dolutegravir-lamivud (TRIUMEQ) 600-50-300 mg Tab Take 1 tablet by mouth daily. 30 tablet 5   ??? buprenorphine HCl (SUBUTEX) 2 mg Subl Place 2 mg under the tongue daily.     ??? cloNIDine HCl (CATAPRES) 0.1 MG tablet Take 1 tablet (0.1 mg total) by mouth 3 times a day as needed (anxiety). 12 tablet 0   ??? hydrOXYzine (VISTARIL) 100 MG capsule Take 1 capsule (100 mg total) by mouth 4 times a day as needed for Anxiety. Indications: anxiety 10 capsule 0   ??? ibuprofen (ADVIL,MOTRIN) 600 MG tablet Take 1 tablet (600 mg total) by mouth every 6 hours as needed for Pain.  12 tablet 0   ??? ketoconazole (NIZORAL) 2 % cream Apply topically daily.     ??? lisinopril (PRINIVIL,ZESTRIL) 5 MG tablet Take 5 mg by mouth daily.               ??? loperamide (IMODIUM) 2 mg capsule Take 1 capsule (2 mg total) by mouth 4 times a day as needed for Diarrhea. 16 capsule 0   ??? melatonin 3 mg Tab      ??? proMETHazine (PHENERGAN) 25 MG tablet Take 1 tablet (25 mg total) by mouth every 6 hours as needed for Nausea. 12 tablet 4   ??? thiamine HCl (VITAMIN B-1) 50 MG tablet Take 1 tablet (50 mg total) by mouth daily. 30 tablet 3   ??? traZODone (DESYREL) 100 MG tablet Take 100 mg by mouth at bedtime.       No facility-administered encounter medications on file as of 11/12/2019.         Review of Systems   Constitutional: Positive for fatigue. Negative for chills, diaphoresis, fever and weight loss.   HENT: Negative for congestion and rhinorrhea.    Eyes: Negative for pain and visual disturbance.   Respiratory: Negative for cough, shortness of breath and wheezing.    Cardiovascular: Negative for chest pain, palpitations and leg swelling.   Gastrointestinal: Negative for abdominal distention, abdominal pain, diarrhea, nausea and vomiting.   Genitourinary: Negative for difficulty urinating and dysuria.   Musculoskeletal: Negative for back pain, myalgias and neck pain.   Skin: Negative for rash and wound.   Neurological: Negative for weakness and numbness.   Hematological: Negative for adenopathy.   Psychiatric/Behavioral: Negative for behavioral problems, confusion, dysphoric mood and suicidal ideas. The patient is not nervous/anxious.        Vitals  There were no vitals taken for this visit.    Physical Exam  Vitals signs and nursing note reviewed.   Constitutional:       General: He is not in acute distress.     Appearance: He is well-developed. He is not diaphoretic.   HENT:      Head: Normocephalic and atraumatic.      Mouth/Throat:      Pharynx: No oropharyngeal exudate.   Eyes:      General: No scleral icterus.         Right eye: No discharge.         Left eye: No discharge.      Conjunctiva/sclera: Conjunctivae normal.      Pupils: Pupils are equal, round, and reactive to light.   Neck:      Musculoskeletal: Normal  range of motion and neck supple.      Thyroid: No thyromegaly.      Vascular: No JVD.      Trachea: No tracheal deviation.   Cardiovascular:      Rate and Rhythm: Normal rate and regular rhythm.      Heart sounds: Normal heart sounds. No murmur. No friction rub. No gallop.    Pulmonary:      Effort: Pulmonary effort is normal. No respiratory distress.      Breath sounds: Normal breath sounds. No stridor. No wheezing or rales.   Chest:      Chest wall: No tenderness.   Abdominal:      General: There is no distension.      Palpations: Abdomen is soft. There is no mass.      Tenderness: There is no abdominal tenderness. There is no guarding or rebound.   Musculoskeletal: Normal range of motion.         General: No tenderness.   Lymphadenopathy:      Cervical: No cervical adenopathy.   Skin:     General: Skin is warm and dry.      Coloration: Skin is not pale.      Findings: No erythema or rash.   Neurological:      Mental Status: He is alert and oriented to person, place, and time.      Cranial Nerves: No cranial nerve deficit.      Motor: No abnormal muscle tone.      Deep Tendon Reflexes: Reflexes are normal and symmetric. Reflexes normal.   Psychiatric:         Behavior: Behavior normal.         Thought Content: Thought content normal.         Judgment: Judgment normal.         Review of Lab Results  Lab Results   Component Value Date    HGB 14.1 08/28/2019    HCT 42.5 08/28/2019    WBC 4.4 08/28/2019    PLT 200 08/28/2019     Lab Results   Component Value Date    NEUTOPHILPCT 62.7 08/28/2019    LYMPHOPCT 23.2 08/28/2019    MONOPCT 10.7 08/28/2019    EOSPCT 2.8 08/28/2019    BASOPCT 0.6 08/28/2019    NEUTROABS 2,759 08/28/2019    LYMPHSABS 1,021 08/28/2019    MONOSABS 471 08/28/2019    EOSABS 123 08/28/2019     BASOSABS 26 08/28/2019     Lab Results   Component Value Date    NA 137 08/28/2019    K 4.2 08/28/2019    CL 101 08/28/2019    CO2 26 08/28/2019    GLUCOSE 104 (H) 08/28/2019    BUN 11 08/28/2019    CREATININE 0.65 08/28/2019    MG 2.0 02/05/2017    PHOS 4.0 08/28/2019     Lab Results   Component Value Date    ALKPHOS 122 08/28/2019    AST 210 (H) 08/28/2019    ALT 143 (H) 08/28/2019    ALBUMIN 4.0 08/28/2019    ALBUMIN 4.0 08/28/2019    BILIDIRECT 0.15 08/28/2019    BILITOT 0.5 08/28/2019    PROT 8.3 08/28/2019     Lab Results   Component Value Date    CHOLTOT 142 08/28/2019    TRIG 82 08/28/2019    HDL 42 (L) 08/28/2019    LDL 84 08/28/2019     Lab Results   Component Value Date  COLORU Yellow 02/27/2018    CLARITYU Clear 02/27/2018    LABSPEC 1.020 02/27/2018    PHUR 8.5 (H) 02/27/2018    PROTEINUA Negative 02/27/2018    GLUCOSEU Negative 02/27/2018    KETONESU Negative 02/27/2018    BILIRUBINUR Negative 02/27/2018    BLOODU Negative 02/27/2018    NITRITE Negative 02/27/2018    UROBILINOGEN 0.2 E.U./dL 16/09/9603    LEUKOCYTESUR Negative 02/27/2018    RBCUA 5 (H) 11/13/2013    WBCUA 3 11/13/2013    BACTERIA Rare (A) 11/13/2013    MUCUS Present (A) 11/13/2013     Lab Results   Component Value Date    ABSCD4HELP 301.0 08/28/2019    CD4POSLY 34.3 08/28/2019     Lab Results   Component Value Date    HIV1COP 29 09/15/2017     Lab Results   Component Value Date    HEPAIGG Reactive (A) 08/28/2019     Lab Results   Component Value Date    HEPBSAG Nonreactive 02/20/2017    HBSAB Reactive (A) 02/20/2017    HBSABNUMBER >1000.00 (H) 02/20/2017    HEPBCAB Nonreactive 02/20/2017     Lab Results   Component Value Date    HCVAB Reactive (A) 02/20/2017    IU 5,409,811 08/28/2019    HEPCGENO 3 08/28/2019     Lab Results   Component Value Date    TREPIA Negative 08/28/2019    RPRMONSCRN Non-Reactive 11/15/2013     Lab Results   Component Value Date    [facility] Negative 02/20/2017    GCNAA Negative 02/20/2017     Lab Results    Component Value Date    QUANTIFERTB NEGATIVE 02/20/2017             Assessment/Plan  HIV infection (CMS Dx)  Back on meds consistently for about 3 weeks.  His last CD4 count was 301.0 on 08/28/2019 and his last viral load was 29 on 09/15/2017 (viral load ordered on 9/23 was cancelled by the lab for some reason- likely lab error).  He reports he is tolerating this regimen without side effects.  We discussed the following plan today:  ??? Continue antiretroviral regimen.  ??? Labs ordered today.  ??? Reviewed medications for drug-drug interactions.  ??? Reviewed labs/test results with patient.  ??? Counseling provided on adherence & goals of therapy.  ?? Reviewed prevention of transmission/ risk reduction.      Chronic hepatitis C without hepatic coma (CMS Dx)  HCV RNA 9,147,829 on 08/28/2019.  Fibroscan c/w F0/F1 on 10/19/17.  Interested in therapy.  Hep B immune.  Hep A immune.    ?? Will consider treatment once he is reestablished on HIV meds.  ?? Will likely need a 6 month period of sobriety.  ?? Encouraged alcohol cessation     Follow-up in 1 month.    The HPI, ROS, physical exam, test results, and assessment & plan were reviewed and copied forward (with edits) from a note written by me on 08/27/19. I have reviewed and updated the history, physical exam, data, assessment, and plan of the note so that it reflects my evaluation and management of the patient.

## 2019-11-12 NOTE — Patient Instructions (Signed)
1. Continue Triumeq  2. Labs today  3. Follow-up in 1 month.

## 2019-11-12 NOTE — Assessment & Plan Note (Signed)
Back on meds consistently for about 3 weeks.  His last CD4 count was 301.0 on 08/28/2019 and his last viral load was 29 on 09/15/2017 (viral load ordered on 9/23 was cancelled by the lab for some reason- likely lab error).  He reports he is tolerating this regimen without side effects.  We discussed the following plan today:   Continue antiretroviral regimen.   Labs ordered today.   Reviewed medications for drug-drug interactions.   Reviewed labs/test results with patient.   Counseling provided on adherence & goals of therapy.   Reviewed prevention of transmission/ risk reduction.

## 2019-11-12 NOTE — Assessment & Plan Note (Signed)
HCV RNA 1,610,960 on 08/28/2019.  Fibroscan c/w F0/F1 on 10/19/17.  Interested in therapy.  Hep B immune.  Hep A immune.     Will consider treatment once he is reestablished on HIV meds.   Will likely need a 6 month period of sobriety.   Encouraged alcohol cessation

## 2019-12-10 ENCOUNTER — Ambulatory Visit: Payer: PRIVATE HEALTH INSURANCE

## 2019-12-12 MED ORDER — ondansetron (ZOFRAN) 4 MG tablet
4 | ORAL_TABLET | Freq: Three times a day (TID) | ORAL | 5 refills | Status: AC | PRN
Start: 2019-12-12 — End: 2020-02-01

## 2020-01-21 ENCOUNTER — Ambulatory Visit: Payer: PRIVATE HEALTH INSURANCE

## 2020-01-27 ENCOUNTER — Ambulatory Visit: Admit: 2020-01-27 | Payer: PRIVATE HEALTH INSURANCE

## 2020-01-27 DIAGNOSIS — B20 Human immunodeficiency virus [HIV] disease: Secondary | ICD-10-CM

## 2020-01-27 LAB — RENAL FUNCTION PANEL W/EGFR
Albumin: 4.3 g/dL (ref 3.5–5.7)
Anion Gap: 9 mmol/L (ref 3–16)
BUN: 7 mg/dL (ref 7–25)
CO2: 28 mmol/L (ref 21–33)
Calcium: 9.1 mg/dL (ref 8.6–10.3)
Chloride: 103 mmol/L (ref 98–110)
Creatinine: 0.65 mg/dL (ref 0.60–1.30)
Glucose: 93 mg/dL (ref 70–100)
Osmolality, Calculated: 288 mosm/kg (ref 278–305)
Phosphorus: 4.7 mg/dL (ref 2.1–4.7)
Potassium: 4.5 mmol/L (ref 3.5–5.3)
Sodium: 140 mmol/L (ref 133–146)
eGFR AA CKD-EPI: >90 See note.
eGFR NONAA CKD-EPI: >90 See note.

## 2020-01-27 LAB — DIFFERENTIAL
Basophils Absolute: 36 /uL (ref 0–200)
Basophils Relative: 0.5 % (ref 0.0–1.0)
Eosinophils Absolute: 92 /uL (ref 15–500)
Eosinophils Relative: 1.3 % (ref 0.0–8.0)
Lymphocytes Absolute: 1392 /uL (ref 850–3900)
Lymphocytes Relative: 19.6 % (ref 15.0–45.0)
Monocytes Absolute: 724 /uL (ref 200–950)
Monocytes Relative: 10.2 % (ref 0.0–12.0)
Neutrophils Absolute: 4856 /uL (ref 1500–7800)
Neutrophils Relative: 68.4 % (ref 40.0–80.0)
nRBC: 0 /100{WBCs} (ref 0–0)

## 2020-01-27 LAB — HEPATIC FUNCTION PANEL
ALT: 150 U/L — ABNORMAL HIGH (ref 7–52)
AST: 224 U/L — ABNORMAL HIGH (ref 13–39)
Albumin: 4.3 g/dL (ref 3.5–5.7)
Alkaline Phosphatase: 112 U/L (ref 36–125)
Bilirubin, Direct: 0.11 mg/dL (ref 0.00–0.40)
Bilirubin, Indirect: 0.19 mg/dL (ref 0.00–1.10)
Total Bilirubin: 0.3 mg/dL (ref 0.0–1.5)
Total Protein: 8 g/dL (ref 6.4–8.9)

## 2020-01-27 LAB — LYMPHOCYTE SUBSET FOLLOWUP
% CD 3 Pos. Lymph.: 82.5 % (ref 56.0–89.0)
% CD 4 Pos. Lymph.: 34 % (ref 30.0–64.0)
% CD 8 Pos. Lymph.: 41.3 % (ref 6.0–43.0)
Absolute CD 3: 1172 {cells}/uL (ref 870.0–2532.0)
Absolute CD 4 Helper: 471 {cells}/uL (ref 255.0–2496.0)
Absolute CD 8 (Supp): 572 {cells}/uL (ref 155.0–1010.0)
CD19 % B Cell: 7.9 % (ref 1.0–28.0)
CD19 Abs: 115 {cells}/uL (ref 24.0–683.0)
CD4/CD8 Ratio: 0.82 ratio — ABNORMAL LOW (ref 1.10–3.70)

## 2020-01-27 LAB — CBC
Hematocrit: 41.4 % (ref 38.5–50.0)
Hemoglobin: 13.6 g/dL (ref 13.2–17.1)
MCH: 29.1 pg (ref 27.0–33.0)
MCHC: 32.8 g/dL (ref 32.0–36.0)
MCV: 88.5 fL (ref 80.0–100.0)
MPV: 8.5 fL (ref 7.5–11.5)
Platelets: 264 10*3/uL (ref 140–400)
RBC: 4.67 10*6/uL (ref 4.20–5.80)
RDW: 14.9 % (ref 11.0–15.0)
WBC: 7.1 10*3/uL (ref 3.8–10.8)

## 2020-01-27 LAB — LIPID PANEL
Cholesterol, Total: 132 mg/dL (ref 0–200)
HDL: 40 mg/dL (ref 60–92)
LDL Cholesterol: 74 mg/dL
Triglycerides: 89 mg/dL (ref 10–149)

## 2020-01-27 LAB — HIV-1 RNA, QUANTITATIVE, PCR: HIV 1 Copies: NOT DETECTED copies/mL

## 2020-01-27 LAB — TREPONEMA PALLIDUM AB WITH REFLEX: Treponema Pallidum: NEGATIVE

## 2020-01-27 LAB — VITAMIN D 25 HYDROXY: Vit D, 25-Hydroxy: 15.6 ng/mL (ref 30.0–100.0)

## 2020-01-28 ENCOUNTER — Ambulatory Visit: Payer: PRIVATE HEALTH INSURANCE

## 2020-02-01 ENCOUNTER — Inpatient Hospital Stay
Admission: EM | Admit: 2020-02-01 | Discharge: 2020-02-02 | Discharge status: Against Medical Advice | Payer: PRIVATE HEALTH INSURANCE

## 2020-02-01 DIAGNOSIS — F10239 Alcohol dependence with withdrawal, unspecified: Secondary | ICD-10-CM

## 2020-02-01 LAB — HEPATIC FUNCTION PANEL
ALT: 140 U/L (ref 7–52)
AST: 246 U/L (ref 13–39)
Albumin: 4.1 g/dL (ref 3.5–5.7)
Alkaline Phosphatase: 139 U/L (ref 36–125)
Bilirubin, Direct: 0.2 mg/dL (ref 0.0–0.4)
Bilirubin, Indirect: 0.3 mg/dL (ref 0.0–1.1)
Total Bilirubin: 0.5 mg/dL (ref 0.0–1.5)
Total Protein: 8.6 g/dL (ref 6.4–8.9)

## 2020-02-01 LAB — DIFFERENTIAL
Basophils Absolute: 21 /uL (ref 0–200)
Basophils Relative: 0.2 % (ref 0.0–1.0)
Eosinophils Absolute: 11 /uL (ref 15–500)
Eosinophils Relative: 0.1 % (ref 0.0–8.0)
Lymphocytes Absolute: 763 /uL (ref 850–3900)
Lymphocytes Relative: 7.2 % (ref 15.0–45.0)
Monocytes Absolute: 816 /uL (ref 200–950)
Monocytes Relative: 7.7 % (ref 0.0–12.0)
Neutrophils Absolute: 8989 /uL (ref 1500–7800)
Neutrophils Relative: 84.8 % (ref 40.0–80.0)
nRBC: 0 /100 WBC (ref 0–0)

## 2020-02-01 LAB — BASIC METABOLIC PANEL
Anion Gap: 13 mmol/L (ref 3–16)
BUN: 9 mg/dL (ref 7–25)
CO2: 22 mmol/L (ref 21–33)
Calcium: 8.9 mg/dL (ref 8.6–10.3)
Chloride: 98 mmol/L (ref 98–110)
Creatinine: 0.64 mg/dL (ref 0.60–1.30)
Glucose: 106 mg/dL (ref 70–100)
Osmolality, Calculated: 275 mOsm/kg (ref 278–305)
Potassium: 3.8 mmol/L (ref 3.5–5.3)
Sodium: 133 mmol/L (ref 133–146)
eGFR AA CKD-EPI: >90 See note.
eGFR NONAA CKD-EPI: >90 See note.

## 2020-02-01 LAB — 2019 NOVEL CORONAVIRUS (COVID-19), NAA-B: SARS-CoV-2: NOT DETECTED

## 2020-02-01 LAB — CBC
Hematocrit: 40.2 % (ref 38.5–50.0)
Hemoglobin: 13.5 g/dL (ref 13.2–17.1)
MCH: 29.7 pg (ref 27.0–33.0)
MCHC: 33.5 g/dL (ref 32.0–36.0)
MCV: 88.8 fL (ref 80.0–100.0)
MPV: 8.7 fL (ref 7.5–11.5)
Platelets: 213 10*3/uL (ref 140–400)
RBC: 4.53 10*6/uL (ref 4.20–5.80)
RDW: 14.6 % (ref 11.0–15.0)
WBC: 10.6 10*3/uL (ref 3.8–10.8)

## 2020-02-01 LAB — PHOSPHORUS: Phosphorus: 2.2 mg/dL (ref 2.1–4.7)

## 2020-02-01 LAB — MAGNESIUM: Magnesium: 1.6 mg/dL (ref 1.5–2.5)

## 2020-02-01 MED ORDER — ondansetron (ZOFRAN) tablet 4 mg
4 | Freq: Four times a day (QID) | ORAL | Status: AC | PRN
Start: 2020-02-01 — End: 2020-02-02

## 2020-02-01 MED ORDER — LORazepam (ATIVAN) injection 2 mg
2 | Freq: Once | INTRAMUSCULAR | Status: AC
Start: 2020-02-01 — End: 2020-02-01
  Administered 2020-02-01: 11:00:00 2 mg via INTRAVENOUS

## 2020-02-01 MED ORDER — ondansetron (ZOFRAN) injection 4 mg
4 | Freq: Once | INTRAMUSCULAR | Status: AC
Start: 2020-02-01 — End: 2020-02-01
  Administered 2020-02-01: 21:00:00 4 mg via INTRAVENOUS

## 2020-02-01 MED ORDER — nicotine (NICODERM CQ) 21 mg/24 hr 1 patch
21 | Freq: Every day | TRANSDERMAL | Status: AC
Start: 2020-02-01 — End: 2020-02-02
  Administered 2020-02-01: 18:00:00 1 via TRANSDERMAL

## 2020-02-01 MED ORDER — LORazepam (ATIVAN) injection 4 mg
2 | Freq: Once | INTRAMUSCULAR | Status: AC
Start: 2020-02-01 — End: 2020-02-01
  Administered 2020-02-01: 15:00:00 4 mg via INTRAVENOUS

## 2020-02-01 MED ORDER — folic acid (FOLVITE) tablet 1 mg
1 | Freq: Once | ORAL | Status: AC
Start: 2020-02-01 — End: 2020-02-01
  Administered 2020-02-01: 11:00:00 1 mg via ORAL

## 2020-02-01 MED ORDER — ondansetron (ZOFRAN-ODT) disintegrating tablet 4 mg
4 | Freq: Once | ORAL | Status: AC
Start: 2020-02-01 — End: 2020-02-01

## 2020-02-01 MED ORDER — cloNIDine HCL (CATAPRES) tablet 0.1 mg
0.1 | Freq: Two times a day (BID) | ORAL | Status: AC | PRN
Start: 2020-02-01 — End: 2020-02-02

## 2020-02-01 MED ORDER — heparin (porcine) injection 5,000 Units
5000 | Freq: Three times a day (TID) | INTRAMUSCULAR | Status: AC
Start: 2020-02-01 — End: 2020-02-02
  Administered 2020-02-01: 17:00:00 5000 [IU] via SUBCUTANEOUS

## 2020-02-01 MED ORDER — magnesium sulfate in sterile water 100 mL IVPB 4 g
4 | Freq: Once | INTRAVENOUS | Status: AC
Start: 2020-02-01 — End: 2020-02-01
  Administered 2020-02-01: 20:00:00 4 g via INTRAVENOUS

## 2020-02-01 MED ORDER — magnesium sulfate in D5W 100 mL 1 gram/100 mL IVPB 1 g
1 | INTRAVENOUS | Status: AC
Start: 2020-02-01 — End: 2020-02-01

## 2020-02-01 MED ORDER — lactated Ringers 1,000 mL IV fluid
Freq: Once | INTRAVENOUS | Status: AC
Start: 2020-02-01 — End: 2020-02-01
  Administered 2020-02-01: 11:00:00 via INTRAVENOUS

## 2020-02-01 MED ORDER — LORazepam (ATIVAN) injection 4 mg
2 | INTRAMUSCULAR | Status: AC | PRN
Start: 2020-02-01 — End: 2020-02-02
  Administered 2020-02-01 (×4): 4 mg via INTRAVENOUS

## 2020-02-01 MED ORDER — LORazepam (ATIVAN) injection 4 mg
2 | Freq: Once | INTRAMUSCULAR | Status: AC
Start: 2020-02-01 — End: 2020-02-01
  Administered 2020-02-01: 13:00:00 4 mg via INTRAVENOUS

## 2020-02-01 MED ORDER — doxycycline (MONODOX) capsule 100 mg
100 | Freq: Once | ORAL | Status: AC
Start: 2020-02-01 — End: 2020-02-01
  Administered 2020-02-01: 13:00:00 100 mg via ORAL

## 2020-02-01 MED ORDER — LORazepam (ATIVAN) injection 2 mg
2 | INTRAMUSCULAR | Status: AC | PRN
Start: 2020-02-01 — End: 2020-02-02
  Administered 2020-02-01: 20:00:00 2 mg via INTRAVENOUS

## 2020-02-01 MED ORDER — diazePAM (VALIUM) tablet 10 mg
5 | Freq: Once | ORAL | Status: AC
Start: 2020-02-01 — End: 2020-02-01
  Administered 2020-02-01: 11:00:00 10 mg via ORAL

## 2020-02-01 MED ORDER — diazePAM (VALIUM) tablet 10 mg
5 | Freq: Three times a day (TID) | ORAL | Status: AC
Start: 2020-02-01 — End: 2020-02-02
  Administered 2020-02-01: 17:00:00 10 mg via ORAL

## 2020-02-01 MED ORDER — nicotine polacrilex (NICORETTE) gum 4 mg
2 | BUCCAL | Status: AC | PRN
Start: 2020-02-01 — End: 2020-02-02

## 2020-02-01 MED ORDER — ibuprofen (MOTRIN) tablet 400 mg
400 | ORAL | Status: AC | PRN
Start: 2020-02-01 — End: 2020-02-02

## 2020-02-01 MED ORDER — methadone (DOLOPHINE) 5 mg/5 mL oral solution 34 mg
5 | Freq: Every day | ORAL | Status: AC
Start: 2020-02-01 — End: 2020-02-02
  Administered 2020-02-01: 17:00:00 34 mg via ORAL

## 2020-02-01 MED ORDER — sod phos di, mono-K phos mono (K-PHOS NEUTRAL) 250 mg tablet Tab 500 mg
250 | ORAL | Status: AC
Start: 2020-02-01 — End: 2020-02-01
  Administered 2020-02-01: 20:00:00 500 mg via ORAL

## 2020-02-01 MED ORDER — thiamine mononitrate (VITAMIN B-1) tablet 100 mg
100 | Freq: Every day | ORAL | Status: AC
Start: 2020-02-01 — End: 2020-02-02
  Administered 2020-02-01: 11:00:00 100 mg via ORAL

## 2020-02-01 MED ORDER — diazePAM (VALIUM) Syrg 10 mg
5 | Freq: Three times a day (TID) | INTRAMUSCULAR | Status: AC
Start: 2020-02-01 — End: 2020-02-02

## 2020-02-01 MED ORDER — potassium chloride (KLOR-CON M20) CR tablet 40 mEq
20 | Freq: Once | ORAL | Status: AC
Start: 2020-02-01 — End: 2020-02-01
  Administered 2020-02-01: 18:00:00 40 meq via ORAL

## 2020-02-01 MED ORDER — methadone (DOLOPHINE) tablet 35 mg
10 | Freq: Once | ORAL | Status: AC
Start: 2020-02-01 — End: 2020-02-01

## 2020-02-01 MED ORDER — ipratropium-albuteroL (DUO-NEB) 0.5 mg-3 mg(2.5 mg base)/3 mL nebulizer solution 3 mL
0.5 | RESPIRATORY_TRACT | Status: AC | PRN
Start: 2020-02-01 — End: 2020-02-02

## 2020-02-01 MED ORDER — abacavir-dolutegravir-lamivud (TRIUMEQ) 600-50-300 mg tablet 1 tablet
600-50-300 | Freq: Every day | ORAL | Status: AC
Start: 2020-02-01 — End: 2020-02-02
  Administered 2020-02-01: 18:00:00 1 via ORAL

## 2020-02-01 MED ORDER — LORazepam (ATIVAN) injection 2 mg
2 | Freq: Once | INTRAMUSCULAR | Status: AC
Start: 2020-02-01 — End: 2020-02-01

## 2020-02-01 MED ORDER — doxycycline (MONODOX) capsule 100 mg
100 | Freq: Two times a day (BID) | ORAL | Status: AC
Start: 2020-02-01 — End: 2020-02-02

## 2020-02-01 MED ORDER — loperamide (IMODIUM) capsule 2 mg
2 | ORAL | Status: AC | PRN
Start: 2020-02-01 — End: 2020-02-02

## 2020-02-01 MED ORDER — multivitamin with folic acid tablet 1 tablet
400 | Freq: Once | ORAL | Status: AC
Start: 2020-02-01 — End: 2020-02-01
  Administered 2020-02-01: 11:00:00 1 via ORAL

## 2020-02-01 MED ORDER — cloNIDine HCL (CATAPRES) tablet 0.1 mg
0.1 | Freq: Once | ORAL | Status: AC
Start: 2020-02-01 — End: 2020-02-01

## 2020-02-01 MED FILL — MAGNESIUM SULFATE 4 GRAM/100 ML (4 %) IN WATER INTRAVENOUS PIGGYBACK: 4 4 gram/100 mL (4 %) | INTRAVENOUS | Qty: 100

## 2020-02-01 MED FILL — NICOTINE (POLACRILEX) 4 MG GUM: 4 4 MG | BUCCAL | Qty: 1

## 2020-02-01 MED FILL — DIAZEPAM 5 MG TABLET: 5 5 MG | ORAL | Qty: 2

## 2020-02-01 MED FILL — VITAMIN B-1 (MONONITRATE) 100 MG TABLET: 100 100 mg | ORAL | Qty: 1

## 2020-02-01 MED FILL — KLOR-CON M20 MEQ TABLET,EXTENDED RELEASE: 20 20 mEq | ORAL | Qty: 2

## 2020-02-01 MED FILL — LORAZEPAM 2 MG/ML INJECTION SOLUTION: 2 2 mg/mL | INTRAMUSCULAR | Qty: 2

## 2020-02-01 MED FILL — LORAZEPAM 2 MG/ML INJECTION SOLUTION: 2 2 mg/mL | INTRAMUSCULAR | Qty: 1

## 2020-02-01 MED FILL — FOLIC ACID 1 MG TABLET: 1 1 MG | ORAL | Qty: 1

## 2020-02-01 MED FILL — THERA 400 MCG TABLET: 400 400 mcg | ORAL | Qty: 1

## 2020-02-01 MED FILL — TRIUMEQ 600 MG-50 MG-300 MG TABLET: 600-50-300 600-50-300 mg | ORAL | Qty: 1

## 2020-02-01 MED FILL — VIRT-PHOS NEUTRAL 250 MG TABLET: 250 250 mg | ORAL | Qty: 2

## 2020-02-01 MED FILL — METHADONE 5 MG/5 ML ORAL SOLUTION: 5 5 mg/5 mL | ORAL | Qty: 34

## 2020-02-01 MED FILL — NICOTINE 21 MG/24 HR DAILY TRANSDERMAL PATCH: 21 21 mg/24 hr | TRANSDERMAL | Qty: 1

## 2020-02-01 MED FILL — DOXYCYCLINE MONOHYDRATE 100 MG CAPSULE: 100 100 MG | ORAL | Qty: 1

## 2020-02-01 MED FILL — ONDANSETRON HCL (PF) 4 MG/2 ML INJECTION SOLUTION: 4 4 mg/2 mL | INTRAMUSCULAR | Qty: 2

## 2020-02-01 MED FILL — HEPARIN (PORCINE) 5,000 UNIT/ML INJECTION SOLUTION: 5000 5,000 unit/mL | INTRAMUSCULAR | Qty: 1

## 2020-02-01 NOTE — Progress Notes (Signed)
Hospital Medicine Attending Cross Cover Note    Received call regarding: Alexander [name]    Issue: AMA    Action: I was called by nursing staff that the patient wanted to leave AGAINST MEDICAL ADVICE. I went to the patient's room and had an extensive conversation regarding leaving could lead to serious injury, illness, disability, up to and including death. The patient verbalized understanding and accepts these risks. He demonstrated capacity to make his medical decisions.    Retta Mac, DO   Attending Physician  Department of Internal Medicine  6:51 PM, 02/01/2020

## 2020-02-01 NOTE — Unmapped (Signed)
Department of Internal Medicine  History & Physical    Patient: Alexander [name]  MRN: [id number]  CSN: [id number]    Chief Complaint     Alcohol withdrawal    History of Present Illness     Alexander [name] is a 34 y.o. male with a PMH significant for EtOH/heroin use disorder, HIV (last CD4 471 01/27/20), Hep C who presented to the hospital on 02/01/2020 for alcohol withdrawal.    Says last drink was about 2 days ago. States he stopped because trying to get clean. [facility] Spaniel is going [facility]. Drinking about 1 case of beer per day (24 beers). Has been drinking for about 10 years. Has tried to quit previously, no hx of seizures in hospital or hospitalization for withdrawal in the past. Says that the night before last night he had an episode of shaking and lost consciousness. No hallucinations right now. Feeling diaphoretic and shakey. No fevers.    Last heroin use 3 days ago. Also receives 34mg  of methadone daily as outpatient. Hx of HIV and Hep C from needle sharing. Sees ID clinic, last CD4 was a few days ago and was 471. Also reports a lump on arm that is somewhat painful located at a previous heroin injection site. This lump has been going on for about 3 days.    Review of Systems     Review of Systems   Constitutional: Positive for chills and weight loss. Negative for fever.   HENT: Negative for sore throat.    Eyes: Negative for blurred vision.   Respiratory: Positive for shortness of breath. Negative for cough.    Cardiovascular: Positive for palpitations. Negative for chest pain, orthopnea and leg swelling.   Gastrointestinal: Positive for nausea and vomiting. Negative for abdominal pain, blood in stool, diarrhea and melena.   Genitourinary: Negative for dysuria and hematuria.   Musculoskeletal: Negative for joint pain.   Neurological: Positive for sensory change (numbness in tips of fingers and feet). Negative for weakness and headaches.   Endo/Heme/Allergies: Does not bruise/bleed easily.    Psychiatric/Behavioral: Positive for substance abuse. Negative for depression.     Past Medical, Surgical, Family and Social History     Past Medical History:   Diagnosis Date   ??? Anxiety    ??? Bipolar disorder (CMS Dx)    ??? Bronchitis    ??? Depression    ??? Drug abuse and dependence (CMS Dx)    ??? H/O tooth extraction    ??? Hepatitis C    ??? Liver disease Hep C   ??? PTSD (post-traumatic stress disorder)    ??? Substance induced mood disorder (CMS Dx)    ??? Thyroid disease      Past Surgical History:   Procedure Laterality Date   ??? MANDIBLE SURGERY       Family History   Problem Relation Age of Onset   ??? Drug abuse Brother    ??? Suicidality Brother    ??? Hypertension Mother    ??? Transient ischemic attack Mother    ??? Seizures Father    ??? Hypertension Father    ??? Cancer Father    ??? Breast Cancer Father    ??? Suicidality Father    ??? Drug abuse Brother    ??? Suicidality Brother      Social History     Socioeconomic History   ??? Marital status: Single     Spouse name: Not on file   ??? Number of  children: Not on file   ??? Years of education: Not on file   ??? Highest education level: Not on file   Occupational History   ??? Not on file   Social Needs   ??? Financial resource strain: Not on file   ??? Food insecurity     Worry: Not on file     Inability: Not on file   ??? Transportation needs     Medical: Not on file     Non-medical: Not on file   Tobacco Use   ??? Smoking status: Current Every Day Smoker     Packs/day: 1.00     Years: 11.00     Pack years: 11.00     Types: Cigarettes   ??? Smokeless tobacco: Never Used   Substance and Sexual Activity   ??? Alcohol use: Yes     Comment: 10/17/17: reports drinking ~1 day per week   ??? Drug use: Yes     Types: Heroin, Other-see comments, Marijuana, Cocaine     Comment: 10/17/17: reports last illicit drug use ~1 month ago   ??? Sexual activity: Yes     Partners: Female, Male     Birth control/protection: Condom, Other-see comments   Lifestyle   ??? Physical activity     Days per week: Not on file     Minutes  per session: Not on file   ??? Stress: Not on file   Relationships   ??? Social Wellsite geologist on phone: Not on file     Gets together: Not on file     Attends religious service: Not on file     Active member of club or organization: Not on file     Attends meetings of clubs or organizations: Not on file     Relationship status: Not on file   ??? Intimate partner violence     Fear of current or ex partner: Not on file     Emotionally abused: Not on file     Physically abused: Not on file     Forced sexual activity: Not on file   Other Topics Concern   ??? Caffeine Use Yes   ??? Occupational Exposure No   ??? Exercise No   ??? Seat Belt Yes   Social History Narrative   ??? Not on file     Pertinent auto populated data for PMH,PSH,Fxh and Sxh reviewed and updated.    Allergies and Prior to Admission Medications     Allergies:   No Known Allergies  Home Meds:  Prior to Admission medications as of 02/01/20 1053   Medication Sig Taking?   abacavir-dolutegravir-lamivud (TRIUMEQ) 600-50-300 mg Tab Take 1 tablet by mouth daily. Yes   methadone (DOLOPHINE) 10 mg/5 mL solution Take 34 mg by mouth daily. Yes   buprenorphine HCl (SUBUTEX) 2 mg Subl Place 2 mg under the tongue daily.    cloNIDine HCl (CATAPRES) 0.1 MG tablet Take 1 tablet (0.1 mg total) by mouth 3 times a day as needed (anxiety).    hydrOXYzine (VISTARIL) 100 MG capsule Take 1 capsule (100 mg total) by mouth 4 times a day as needed for Anxiety. Indications: anxiety    ibuprofen (ADVIL,MOTRIN) 600 MG tablet Take 1 tablet (600 mg total) by mouth every 6 hours as needed for Pain.    ketoconazole (NIZORAL) 2 % cream Apply topically daily.    lisinopril (PRINIVIL,ZESTRIL) 5 MG tablet Take 5 mg by mouth daily.  loperamide (IMODIUM) 2 mg capsule Take 1 capsule (2 mg total) by mouth 4 times a day as needed for Diarrhea.    melatonin 3 mg Tab     ondansetron (ZOFRAN) 4 MG tablet Take 1 tablet (4 mg total) by mouth every 8 hours as needed for Nausea.    proMETHazine  (PHENERGAN) 25 MG tablet Take 1 tablet (25 mg total) by mouth every 6 hours as needed for Nausea.    thiamine HCl (VITAMIN B-1) 50 MG tablet Take 1 tablet (50 mg total) by mouth daily.    traZODone (DESYREL) 100 MG tablet Take 100 mg by mouth at bedtime.      Inpatient Meds:  Scheduled:  ??? abacavir-dolutegravir-lamivud  1 tablet Oral Daily 0900   ??? diazePAM  10 mg Oral TID    Or   ??? diazePAM  10 mg Intravenous TID   ??? doxycycline  100 mg Oral 2 times per day   ??? heparin  5,000 Units Subcutaneous 3 times per day   ??? methadone  34 mg Oral Daily 0900   ??? nicotine  1 patch Transdermal Daily 0900   ??? thiamine  100 mg Oral Daily 0900     Continuous:  ZOX:[id number] HCL, ibuprofen, loperamide, LORazepam **OR** LORazepam, nicotine **AND** nicotine polacrilex, ondansetron  Home medications reviewed and updated    Vital Signs     Temp:  [98.4 ??F (36.9 ??C)] 98.4 ??F (36.9 ??C)  Heart Rate:  [91-117] 99  Resp:  [17-34] 17  BP: (140-163)/(87-100) 163/99  SpO2: 97 on RA  No intake/output data recorded.  Wt Readings from Last 3 Encounters:   02/27/18 170 lb (77.1 kg)   10/17/17 158 lb (71.7 kg)   08/11/17 149 lb (67.6 kg)     Physical Exam     Physical Exam  Constitutional:       Appearance: He is diaphoretic.   Eyes:      General: No scleral icterus.     Conjunctiva/sclera: Conjunctivae normal.   Neck:      Musculoskeletal: Normal range of motion.   Cardiovascular:      Rate and Rhythm: Regular rhythm. Tachycardia present.      Pulses: Normal pulses.   Pulmonary:      Effort: Pulmonary effort is normal. No accessory muscle usage.      Breath sounds: Wheezing present.   Abdominal:      General: There is no distension.      Palpations: Abdomen is soft.      Tenderness: There is no abdominal tenderness.   Musculoskeletal:      Right lower leg: No edema.      Left lower leg: No edema.   Skin:     General: Skin is warm.      Coloration: Skin is not jaundiced.      Comments: 2 inch indurated lesion on right forearm, minimal surrounding  erythema, lesion itself is tender to touch and mildly erythematous   Neurological:      Mental Status: He is alert. Mental status is at baseline.      Motor: Tremor (mild) present.   Psychiatric:         Behavior: Behavior normal.       Laboratory Data and Diagnostic Studies               Lab 02/01/20  0631   SODIUM 133   POTASSIUM 3.8   CHLORIDE 98   CO2 22   BUN 9  CREATININE 0.64   GLUCOSE 106*   CALCIUM 8.9   MAGNESIUM 1.6   PHOSPHORUS 2.2         Lab 02/01/20  0631   WBC 10.6   HEMOGLOBIN 13.5   MEAN CORPUSCULAR VOLUME 88.8   PLATELETS 213         Lab 02/01/20  0631   AST 246*   ALT 140*   ALK PHOS 139*   BILIRUBIN TOTAL 0.5   BILIRUBIN DIRECT 0.2   ALBUMIN 4.1   TOTAL PROTEIN 8.6                 Labs and diagnostic studies reviewed    Assessment & Plan     Alexander [name] is a 34 y.o. male with Alcohol withdrawal (CMS Dx). Medical problems being addressed in this encounter include the following:    Alcohol withdrawal  Has been drinking 24 beers/day for about 10 years. No hx of withdrawal seizures but does report an episode of shaking and LOC on the first day since stopping drinking. Last drink 48 hours ago.  - CIWA protocol  - Valium 10 TID  - High dose PRN ativan  - s/p rally pack  - addiction services consulted, patient interested in help    Heroin use disorder  Patient concurrently using methadone and heroin. Home dose of methadone 34 mg daily. ED confirmed dose with clinic.  - Continue home methadone  - loperamide, clonidine PRN    HIV (Last CD4 471 01/27/20)  Follows with Dr. Merilynn Finland in ID. Is compliant on Triumeq.  - Continue home triumeq    Transaminitis  Patient with hx of persistent transaminitis likely due to Hep C and alcohol use. High risk for cirrhosis.  - Abd [place] ordered    Right forearm lesion  No clear evidence of abscess. Region is erythematous with pain, suspect cellulitis.  - Continue doxy 100 BID      Inpatient Management:  DVT prophylaxis: SQH  Isolation/COVID: rule out  Code Status: Full  Code  Dispo: med/surg    Diet Orders          Diet regular starting at 02/27 1102        Patient Lines/Drains/Airways Status    Active Line / PIV Line     Name:   Placement date:   Placement time:   Site:   Days:    Peripheral IV 02/01/20 Left Forearm   02/01/20    0510    Forearm   less than 1         NHSN Device Days (01/02/2020 to 01/31/2020)     Central line: 0    Urinary catheter: 0                Signed:  Vivianne Spence, MD  Internal Medicine Resident, PGY-1  Pager: [phone]  12:35 PM, 02/01/2020

## 2020-02-01 NOTE — Nursing Note (Signed)
Pt left AMA.  PIV and CMU removed.  RN witness pt signing AMA paperwork.

## 2020-02-01 NOTE — Unmapped (Signed)
Hospital bed ordered.

## 2020-02-01 NOTE — Unmapped (Signed)
Hospitalist paged regarding acute nausea.

## 2020-02-01 NOTE — Unmapped (Signed)
Pt using the phone independently talking with friend. Pt is calm in no acute distress. Nausea subsided with IV zofran.

## 2020-02-01 NOTE — Nursing Note (Signed)
Pt admitted to rm [id number].  Pt CIWA 12, admin 4 mg of ativan.      Pt requesting to leave AMA. MD ok for pt to leave.  Awaiting MD to sign paper with pt.  Will continue to monitor.

## 2020-02-01 NOTE — Unmapped (Signed)
[facility] ED Note    Date of Service: 02/01/2020  Reason for Visit: Alcohol Intoxication and Addiction Problem      Patient History     HPI  Alexander [name] is a 34 y.o. male with a PMH of alcohol and opioid-use disorder, who presents to the Emergency Department with concerns of alcohol withdrawal.     According to the patient he drinks about a case of beer daily.  His last drink was approximately 48 hours ago.  He pesents today with turns of alcohol withdrawal.  He reports tremulousness as well as palpitations.  He denies any headaches or blurred vision.  He also reports IV drug use, last fentanyl use was approximately 24 hours ago. Patient reports a possible seizure episode yesterday night, where he remembers his extremities shaking uncontrollably. He denies any LOC, loss of bowel/bladder control, or post-ictal state/confusion.    He does report mild swelling in his right forearm at site of previous IV injection for the past 4-5 days.  No active discharge.  Surrounding redness.    Other than stated above, no additional aggravating or alleviating factors are noted.    Past Medical History:   Diagnosis Date   ??? Anxiety    ??? Bipolar disorder (CMS Dx)    ??? Bronchitis    ??? Depression    ??? Drug abuse and dependence (CMS Dx)    ??? H/O tooth extraction    ??? Hepatitis C    ??? Liver disease Hep C   ??? PTSD (post-traumatic stress disorder)    ??? Substance induced mood disorder (CMS Dx)    ??? Thyroid disease      Past Surgical History:   Procedure Laterality Date   ??? MANDIBLE SURGERY       Patient  reports that he has been smoking cigarettes. He has a 11.00 pack-year smoking history. He has never used smokeless tobacco. He reports current alcohol use. He reports current drug use. Drugs: Heroin, Other-see comments, Marijuana, and Cocaine.  Discharge Medication List as of 02/01/2020  8:06 PM      CONTINUE these medications which have NOT CHANGED    Details   abacavir-dolutegravir-lamivud (TRIUMEQ) 600-50-300 mg Tab Take 1 tablet by  mouth daily., Starting Tue 08/06/2019, Normal, Disp-30 tablet, R-5      methadone (DOLOPHINE) 10 mg/5 mL solution Take 34 mg by mouth daily., Historical Med             Allergies:   Allergies as of 02/01/2020   ??? (No Known Allergies)       All nursing notes and triage notes were appropriately reviewed in the course of the creation of this note.     Review of Systems     Review of Systems   Constitutional: Negative for chills and fever.   HENT: Negative for congestion, ear pain and sore throat.    Eyes: Negative for photophobia, pain and discharge.   Respiratory: Negative for cough, shortness of breath and wheezing.    Cardiovascular: Negative for chest pain and palpitations.   Gastrointestinal: Negative for abdominal pain, nausea and vomiting.   Genitourinary: Negative for dysuria, flank pain and urgency.   Musculoskeletal: Negative for back pain, joint pain and neck pain.   Skin: Negative for rash.   Neurological: Negative for dizziness, seizures, weakness and headaches.   Endo/Heme/Allergies: Does not bruise/bleed easily.   Psychiatric/Behavioral: Positive for substance abuse. Negative for depression.     Physical Exam     Vitals:  02/01/20 1116 02/01/20 1424 02/01/20 1600 02/01/20 1650   BP: (!) 163/99 (!) 144/93 139/68 147/75   BP Location:   Left arm Left arm   Patient Position:   Sitting Lying   Pulse: 99  106 106   Resp: 17 23 24 16    Temp:    98.2 ??F (36.8 ??C)   TempSrc:    Oral   SpO2: 97% 98% 98% 96%   Weight:    151 lb 9.6 oz (68.8 kg)   Height:    5' 9 (1.753 m)       Physical Exam  Constitutional:       Appearance: He is diaphoretic.      Comments: Sweaty and anxious in appearance. Bilateral upper extremity tremor and tongue fasciculations.    Cardiovascular:      Rate and Rhythm: Tachycardia present.      Heart sounds: No murmur.   Pulmonary:      Effort: Pulmonary effort is normal. No respiratory distress.   Abdominal:      General: There is no distension.      Palpations: Abdomen is soft.       Tenderness: There is no abdominal tenderness.   Musculoskeletal:      Right lower leg: No edema.      Left lower leg: No edema.   Skin:     Capillary Refill: Capillary refill takes less than 2 seconds.              Diagnostic Studies     Labs:  Labs Reviewed   BASIC METABOLIC PANEL - Abnormal; Notable for the following components:       Result Value    Glucose 106 (*)     Osmolality, Calculated 275 (*)     All other components within normal limits   DIFFERENTIAL - Abnormal; Notable for the following components:    Neutrophils Relative 84.8 (*)     Lymphocytes Relative 7.2 (*)     Neutrophils Absolute 8,989 (*)     Lymphocytes Absolute 763 (*)     Eosinophils Absolute 11 (*)     All other components within normal limits   HEPATIC FUNCTION PANEL - Abnormal; Notable for the following components:    AST 246 (*)     ALT 140 (*)     Alkaline Phosphatase 139 (*)     All other components within normal limits   2019 NOVEL CORONAVIRUS (COVID-19), NAA-B    Narrative:     Does the patient have symptoms of Covid-19 (eg. Fever, dyspnea, cough, loss of smell)?->No  Does the patient urgently (in <24 hours) require a surgery, invasive procedure, or cardiac stress test?->No  Is the patient being admitted to labor and delivery in active labor?->No  Does the patient fit any of these categories? (select all that apply)->None of the above   CBC   MAGNESIUM   PHOSPHORUS       Radiology:  No orders to display     Emergency Department Procedures       ED Course and MDM         Alexander [name] is a 34 y.o. male with a history and presentation as described above in HPI.  The patient was evaluated by myself and the ED Attending Physician, Dr. Lily Peer, and R4 Dr. Wylene Simmer. All management and disposition plans were discussed and agreed upon.    Upon presentation, the patient was anxious in appearance with active tremor of the upper extremities and  tongue fasiculations. Vital signs were notable for tachycardia. Patient's last drink was  approximately 48 hours ago, which is certainly in the timeframe for acute alcohol withdrawal. This is further supported by the patient being a chronic heavy alcohol-user. Initial CIWA was calculated to be 18, yet there was still a concern for severe alcohol withdrawal given a potential EtOH-related seizures yesterday, significant tremor on today's exam, and history of chronic, heavy alcohol consumption. Notably, he has been in withdrawal in the past and has never required an ICU stay of intubation.     Patient was initially given a 2mg  dose of ativan and required a second 2mg  dose of ativan shortly after due to the presence of persistent agitation and tachycardia. Basic EtOH withdrawal labs were drawn including LFT's and a renal profile. A rally pack and IVF's were administered.     There was an area of induration and erythema over his right forearm, at a site of IVDU. This was concerning for a secondary cellulitis. An underlying abscess/fluid collection was not palpated on today's exam and therefore an I&D was not performed. Patient was given a dose of doxycycline for MRSA-coverage.    At the time of sign-out to the oncoming provider, patient was still found to be agitated despite two doses of ativan and was given an additional third dose. Response to this upcoming dose will determine disposition- floor versus stepdown versus ICU. Otherwise, drawn labs were still pending at the time of sign out.     Medications received during this ED visit:  Medications   magnesium sulfate in sterile water 100 mL IVPB 4 g (4 g Intravenous New Bag 02/01/20 1451)     And   magnesium sulfate in D5W 100 mL 1 gram/100 mL IVPB 1 g (has no administration in time range)   diazePAM (VALIUM) tablet 10 mg (10 mg Oral Given 02/01/20 0531)   folic acid (FOLVITE) tablet 1 mg (1 mg Oral Given 02/01/20 0532)     And   multivitamin with folic acid tablet 1 tablet (1 tablet Oral Given 02/01/20 0531)   lactated Ringers 1,000 mL IV fluid ( Intravenous  Stopped 02/01/20 0633)   LORazepam (ATIVAN) injection 2 mg (2 mg Intravenous Given 02/01/20 0618)   LORazepam (ATIVAN) injection 2 mg (2 mg Intravenous Given 02/01/20 0624)   doxycycline (MONODOX) capsule 100 mg (100 mg Oral Given 02/01/20 0809)   LORazepam (ATIVAN) injection 4 mg (4 mg Intravenous Given 02/01/20 0809)   LORazepam (ATIVAN) injection 4 mg (4 mg Intravenous Given 02/01/20 0941)   potassium chloride (KLOR-CON M20) CR tablet 40 mEq (40 mEq Oral Given 02/01/20 1308)   ondansetron (ZOFRAN) injection 4 mg (4 mg Intravenous Given 02/01/20 1550)     At this time I am going off-service and will be signing out care of this patient to my colleague Dr. Loura Back for further care. My colleague's responsibilities will include: follow-up labs, treat EtOH withdrawal as needed, disposition.     Impression     1. Alcohol withdrawal syndrome without complication (CMS Dx)         Plan     Sign out:  The patient is to be signed out to the oncoming provider, please refer to reassessment note for updates. Workup, treatment and diagnosis were discussed with the patient and/or family members; the patient agrees to the plan and all questions were addressed and answered.    Richardson Chiquito, MD, MS PGY-1  [facility]    Critical Care Time (Attendings)  Richardson Chiquito, MD  Resident  02/02/20 1257

## 2020-02-01 NOTE — ED Triage Notes (Signed)
Pt is here for drug and etoh abuse per report. He is alert and oriented at this time

## 2020-02-01 NOTE — Unmapped (Signed)
Ready and clean bed assigned to [id number]. Pt updated on plan of care including transfer and is agreeable. Receiving RN may call [phone] to consult ED RN with questions regarding patients care. Pt is leaving the department in stable condition with all personal items in possession.   Ordered medications that have been received from Pharmacy will not be tubed to receiving unit.  The patient does not have a patient monitor at bedside in the ED.  Most recent vitals: BP (!) 144/93    Pulse 99    Temp 98.4 ??F (36.9 ??C) (Oral)    Resp 23    SpO2 98%     Kenney Houseman ,RN

## 2020-02-01 NOTE — ED Provider Notes (Addendum)
ED Attending Attestation Note    Date of service:  02/01/2020    This patient was seen by the resident physician.  I have seen and examined the patient, agree with the workup, evaluation, management and diagnosis. The care plan has been discussed and I concur.     My assessment reveals a 34 y.o. male presents today with concern for alcohol withdrawal.  He states he drinks a case of beer each day and last drank about two days ago.  He reports that yesterday he had an episode where he collapsed and had a shaking spell.  He reports prior alcohol withdrawal with associated seizures.  He also notes that he uses heroin and has not used in several days.  He has some pain and swelling in his right forearm along the same region where he typically injects heroin.  No fevers or chills.  No productive cough chest pain or dyspnea.    On exam he is awake and alert, he is anxious and tremulous, he has beads of sweat on his forehead, no increased work of breathing, intact radial pulses, he is tachycardic, abdomen is soft and nontender without rebound or guarding    CRITICAL CARE TIME    I have seen and examined this patient and provided 32 minutes of critical care time exclusive of separately billed procedures and treating other patients.  Critical care was necessary to treat or prevent imminent or life threatening deterioration of the following condition(s): alcohol withdrawal    Critical care time was spent personally by me on the following activities: evaluation of patient's resonse to treatment, ordering and review of laboratory studies and re-evaluation of patient's condition

## 2020-02-01 NOTE — Unmapped (Signed)
 ED Note    Alexander [name] is a 34 y.o. male patient who presented to the Emergency Department. This patient was initially seen by an off-going provider. Please see that provider's note for details regarding the initial history, physical exam and ED course.    Care of this patient was signed out to me.    ED Course and MDM     [name] is a 34 y.o. male with history of alcohol and opioid use disorder who presented to the emergency department with alcohol withdrawal.  The patient was evaluated by myself and the ED Attending Physician.  A thorough history and physical was performed.    Initially tachycardic and hypertensive and tachypneic. Patient receives methadone; need to confirm dose with clinic.     Summary of workup:   - Received 10 of valium  - 2mg  of ativan x2 spaced by 5 minutes  - LR 1L  - Thiamine, folate  - LFTs elevated but stable from 5 days ago AST 246, ALT 140, alk phos 139    At this time, the following is pending:   - Mag, phos, BMP, LFTs  - Admit    On my evaluation the patient appears anxious, he is tachycardic and hypertensive.    Additionally, the patient has a right arm indurated lesion with erythema, no palpable fluctuance for drainable fluid pocket. He was started on doxycycline by the off going provider.    He had already received 10 of Valium and Ativan 2 milligrams ??2 with CIWA 19 then 18. Doses were escalated to 4 milligrams of Ativan and repeat CIWA was 17. Gave 4 more milligrams of Ativan and given the high dosage of benzodiazepines required for alcohol withdrawal, patient will be admitted to stepdown for frequency of medication dosing.    Impression     1. Alcohol withdrawal syndrome without complication (CMS Dx)         Plan     Admit: At this time the patient has been admitted to stepdown for further evaluation and management of alcohol withdrawal. The patient will continue to be monitored here in the emergency department until which time he is moved to his new  treatment location. Discussed with admitting team.      Sela Hilding, PGY1  [facility]    After admission, I was alerted that the patient requested methadone that he takes at home. Confirmed with pharmacy last dose was 2/25 and he takes 34mg . Will dose as prescribed.     Sela Hilding, MD  Resident  02/01/20 1610       Sela Hilding, MD  Resident  02/01/20 [phone]

## 2020-02-02 NOTE — Discharge Summary (Signed)
[facility]  Inpatient Discharge Summary    Patient: Alexander [name]   MRN: [id number]   CSN: [id number]    Date of Admission: 02/01/2020  Date of Discharge: 02/02/2020   Admit Attending: Cheral Almas, MD  Discharge Attending: No att. providers found    Diagnoses Present on Admission     Past Medical History:   Diagnosis Date    Anxiety     Bipolar disorder (CMS Dx)     Bronchitis     Depression     Drug abuse and dependence (CMS Dx)     H/O tooth extraction     Hepatitis C     Liver disease Hep C    PTSD (post-traumatic stress disorder)     Substance induced mood disorder (CMS Dx)     Thyroid disease       Discharge Diagnoses     Active Hospital Problems    Diagnosis Date Noted    Alcohol withdrawal (CMS Dx) [F10.239] 02/01/2020    Cellulitis [L03.90] 02/01/2020    Elevated liver enzymes [R74.8] 10/19/2017    Chronic hepatitis C without hepatic coma (CMS Dx) [B18.2] 05/17/2017    HIV infection (CMS Dx) [B20] 03/07/2017    Polysubstance abuse (CMS Dx) [F19.10] 12/11/2013      Resolved Hospital Problems   No resolved problems to display.       Allergies     No Known Allergies  Discharge Medications     Patient left AMA night of 02/01/20    Reason for Admission     Alexander [name] is a 34 y.o. male with a PMH significant for EtOH/heroin use disorder, HIV (last CD4 471 01/27/20), Hep C who presented to the hospital on 02/01/2020 for alcohol withdrawal.    Hospital Course     Alcohol withdrawal  Has been drinking 24 beers/day for about 10 years. No hx of withdrawal seizures. Last drink 48 hours prior to admission. CIWA protocol started, s/p rally pack. Pt left AMA prior to engagement with addiction services.    Heroin use disorder  Patient concurrently using methadone and heroin. Home dose of methadone 34 mg daily. ED confirmed dose with clinic, continued.    HIV (Last CD4 471 01/27/20)  Follows with Dr. Merilynn Finland in ID. Is compliant on Triumeq.    Transaminitis  Patient with hx  of persistent transaminitis likely due to Hep C and alcohol use. High risk for cirrhosis. Abd [place] ordered, incomplete due to AMA.    Right forearm lesion  No clear evidence of abscess. Region is erythematous with pain, suspect cellulitis. Doxy course incomplete due to AMA status.      Discharge Physical Exam   BP 147/75 (BP Location: Left arm, Patient Position: Lying)   Pulse 106   Temp 98.2 F (36.8 C) (Oral)   Resp 16   Ht 5' 9 (1.753 m)   Wt 151 lb 9.6 oz (68.8 kg)   SpO2 96%   BMI 22.39 kg/m    Admit Wt: Weight: 151 lb 9.6 oz (68.8 kg)  Most Recent Wt: Weight: 151 lb 9.6 oz (68.8 kg)    AMA overnight    Consulting Services      None  Procedures/Operations Performed        Lines/Drains/Airways:  Patient Lines/Drains/Airways Status    Active Line / PIV Line     Name:   Placement date:   Placement time:   Site:   Days:    Peripheral IV  02/01/20 Left Forearm   02/01/20    0510    Forearm   1         NHSN Device Days (01/03/2020 to 02/01/2020)     Central line: 0    Urinary catheter: 0              Condition on Discharge     1. Functional Status: Impaired due to medical comorbidities as described above  2. Mental Status: Abnormal as described above  3. Dietary Restrictions / Tube Feeding / TPN:   Inpatient diet:   Diet Orders          None      Discharge diet: As listed above  4. Respiratory / Lines & Tubes / Wounds: None required      Disposition     AMA    Follow-Up Appointments     Future Appointments   Date Time Provider Department Center   02/04/2020 10:20 AM Belva Chimes, MD [facility] ADD HRV HRV     Herbert Pun, MD  [address] [place] [address]  [phone]             Specific Follow-Up Items for Receiving Physician     Primary Care Physician  Herbert Pun, MD  [address] / [place] [address]  [phone]      Signed:  Vivianne Spence, MD  02/02/2020, 3:17 PM

## 2020-02-03 ENCOUNTER — Inpatient Hospital Stay
Admit: 2020-02-03 | Discharge: 2020-02-03 | Disposition: A | Discharge status: Other Acute Inpatient Hospital | Payer: PRIVATE HEALTH INSURANCE

## 2020-02-03 ENCOUNTER — Inpatient Hospital Stay
Admit: 2020-02-03 | Discharge: 2020-02-03 | Disposition: A | Discharge status: Home / Self Care | Payer: PRIVATE HEALTH INSURANCE

## 2020-02-03 DIAGNOSIS — R45851 Suicidal ideations: Secondary | ICD-10-CM

## 2020-02-03 DIAGNOSIS — F1121 Opioid dependence, in remission: Secondary | ICD-10-CM

## 2020-02-03 LAB — BASIC METABOLIC PANEL
Anion Gap: 9 mmol/L (ref 3–16)
BUN: 8 mg/dL (ref 7–25)
CO2: 25 mmol/L (ref 21–33)
Calcium: 8.8 mg/dL (ref 8.6–10.3)
Chloride: 103 mmol/L (ref 98–110)
Creatinine: 0.65 mg/dL (ref 0.60–1.30)
Glucose: 92 mg/dL (ref 70–100)
Osmolality, Calculated: 282 mOsm/kg (ref 278–305)
Potassium: 4.2 mmol/L (ref 3.5–5.3)
Sodium: 137 mmol/L (ref 133–146)
eGFR AA CKD-EPI: >90 See note.
eGFR NONAA CKD-EPI: >90 See note.

## 2020-02-03 LAB — DIFFERENTIAL
Basophils Absolute: 38 /uL (ref 0–200)
Basophils Relative: 0.7 % (ref 0.0–1.0)
Eosinophils Absolute: 81 /uL (ref 15–500)
Eosinophils Relative: 1.5 % (ref 0.0–8.0)
Lymphocytes Absolute: 1172 /uL (ref 850–3900)
Lymphocytes Relative: 21.7 % (ref 15.0–45.0)
Monocytes Absolute: 454 /uL (ref 200–950)
Monocytes Relative: 8.4 % (ref 0.0–12.0)
Neutrophils Absolute: 3656 /uL (ref 1500–7800)
Neutrophils Relative: 67.7 % (ref 40.0–80.0)
nRBC: 0 /100 WBC (ref 0–0)

## 2020-02-03 LAB — HEPATIC FUNCTION PANEL
ALT: 109 U/L (ref 7–52)
AST: 151 U/L (ref 13–39)
Albumin: 3.9 g/dL (ref 3.5–5.7)
Alkaline Phosphatase: 119 U/L (ref 36–125)
Bilirubin, Direct: 0.1 mg/dL (ref 0.0–0.4)
Bilirubin, Indirect: 0.3 mg/dL (ref 0.0–1.1)
Total Bilirubin: 0.4 mg/dL (ref 0.0–1.5)
Total Protein: 7.9 g/dL (ref 6.4–8.9)

## 2020-02-03 LAB — URINE DRUG SCREEN WITHOUT CONFIRMATION, STAT
Amphetamine, 500 ng/mL Cutoff: NEGATIVE
Barbiturates UR, 300  ng/mL Cutoff: NEGATIVE
Benzodiazepines UR, 300 ng/mL Cutoff: NEGATIVE
Buprenorphine, 5 ng/mL Cutoff: NEGATIVE
Cocaine UR, 300 ng/mL Cutoff: NEGATIVE
Fentanyl, 2 ng/mL Cutoff: POSITIVE
Methadone, UR, 300 ng/mL Cutoff: POSITIVE
Opiates UR, 300 ng/mL Cutoff: NEGATIVE
Oxycodone, 100 ng/mL Cutoff: NEGATIVE
THC UR, 50 ng/mL Cutoff: NEGATIVE
Tricyclic Antidepressants, 300 ng/mL Cutoff: NEGATIVE

## 2020-02-03 LAB — CBC
Hematocrit: 38.9 % (ref 38.5–50.0)
Hemoglobin: 13 g/dL (ref 13.2–17.1)
MCH: 29.6 pg (ref 27.0–33.0)
MCHC: 33.3 g/dL (ref 32.0–36.0)
MCV: 88.9 fL (ref 80.0–100.0)
MPV: 8.3 fL (ref 7.5–11.5)
Platelets: 190 10*3/uL (ref 140–400)
RBC: 4.38 10*6/uL (ref 4.20–5.80)
RDW: 14.5 % (ref 11.0–15.0)
WBC: 5.4 10*3/uL (ref 3.8–10.8)

## 2020-02-03 LAB — SALICYLATE LEVEL: Salicylate Lvl: <3 mg/dL (ref 10–30)

## 2020-02-03 LAB — ACETAMINOPHEN LEVEL: Acetaminophen Level: <10 ug/mL (ref 10–30)

## 2020-02-03 MED ORDER — LORazepam (ATIVAN) tablet 0.5 mg
0.5 | ORAL | Status: AC | PRN
Start: 2020-02-03 — End: 2020-02-03

## 2020-02-03 MED ORDER — ondansetron (ZOFRAN) injection 4 mg
4 | Freq: Once | INTRAMUSCULAR | Status: AC
Start: 2020-02-03 — End: 2020-02-03
  Administered 2020-02-03: 12:00:00 4 mg via INTRAVENOUS

## 2020-02-03 MED ORDER — LORazepam (ATIVAN) tablet 1 mg
1 | ORAL | Status: AC | PRN
Start: 2020-02-03 — End: 2020-02-03

## 2020-02-03 MED ORDER — LORazepam (ATIVAN) tablet 1 mg
1 | Freq: Once | ORAL | Status: AC
Start: 2020-02-03 — End: 2020-02-03
  Administered 2020-02-03: 13:00:00 1 mg via ORAL

## 2020-02-03 MED ORDER — LORazepam (ATIVAN) tablet 1 mg
1 | Freq: Once | ORAL | Status: AC
Start: 2020-02-03 — End: 2020-02-03
  Administered 2020-02-03: 15:00:00 1 mg via ORAL

## 2020-02-03 MED ORDER — LORazepam (ATIVAN) tablet 2 mg
1 | ORAL | Status: AC | PRN
Start: 2020-02-03 — End: 2020-02-03

## 2020-02-03 MED FILL — LORAZEPAM 1 MG TABLET: 1 1 MG | ORAL | Qty: 1

## 2020-02-03 MED FILL — ONDANSETRON HCL (PF) 4 MG/2 ML INJECTION SOLUTION: 4 4 mg/2 mL | INTRAMUSCULAR | Qty: 2

## 2020-02-03 NOTE — ED Provider Notes (Signed)
ED Attending Attestation Note    Date of service:  02/03/2020    This patient was seen by the resident physician.  I have seen and examined the patient, agree with the workup, evaluation, management and diagnosis. The care plan has been discussed and I concur.     My assessment reveals a 34 y.o. male who presents with suicidal ideation.  No actual attempt at this time. Was recently admitted for alcohol withdrawal left AGAINST MEDICAL ADVICE.  No signs of withdrawal at this point.Alexander [name]

## 2020-02-03 NOTE — Unmapped (Signed)
SW Note: The pt is a 34 year old male with a history of bipolar, HIV, Hep C, and heroin and alcohol abuse who presented to the [facility] on a SOB by [place] [employer]. He went to the police station requesting assistance for his depression because of his HIV and wanted to stop the pain. The pt states he has been drinking a lot and has not used drugs in the past three days. He was fully assessed by the PSW. The pt was medically cleared and transferred to [facility].    SW met with pt who presents cooperative and easy to engage, though clearly withdrawing from substances. The pt states he was experiencing SI due to withdrawal, adding I really don't want to die. He denies SI at this time and has future orientation regarding treatment. The pt is superficially interested in psychiatric treatment, stating I'm usually in and out (at [facility]) and knows he can follow up there if needed. He denies HI and AH/VH. The pt denies access to guns or weapons.    SW left message with pt's [place] CM, Stephannie Peters, CDCA [phone])    At this time the pt is appropriate for discharge. SW provided resources for suicide prevention, substance abuse, [facility], [place], and Dr. Manson Passey.    There are no further SW needs at this time.

## 2020-02-03 NOTE — ED Notes (Signed)
PSW aware of Pt. And chart review in progress.        Latria Mccarron MSW, LISW  Psychiatric Social Worker   [facility] [phone]

## 2020-02-03 NOTE — Unmapped (Signed)
ED Note    Date of Service: 02/03/2020  Reason for Visit: Suicidal      Patient History     HPI  Alexander [name] is a 34 y.o. male with past medical history of ETOH use and withdrawal (reports drinking a case of beer daily, reports history of withdrawal seizures), bipolar disorder, HIV on HAART tx (2/21 viral load undetectable, CD4 471), hepatitis C, thyroid disease, IVDU (heroin) presents for evaluation of concern for ETOH withdrawal, and SI. He was evaluated at [facility] on 2/24 for ETOH withdrawal, had mild tremor and tachycardia that resolved with Ativan. He did not meet admission requirements and was given prescription for Librium and referred to [facility] alcohol treatment program. He was evaluated again at [facility] emergency Department yesterday for similar and had upper extremity tremors and tongue fasciculations.  His CIWA was 18 and he was treated with 4 mg of Ativan, rally pack and IV fluids and admitted to medicine team. Left AMA earlier today, used IV heroin and felt alcohol withdrawal sx worsening, so he self-medicated with alcohol by drinking a few beers.  Last drink 2 hours ago.  He thinks he may have had withdrawal seizures in the past but denies ever being in the ICU or intubated from withdrawal.  Denies any known history of DTs. Reports he does not feel like he is in withdrawal currently and has no other complaints at this time.  Patient is more concerned about potential for withdrawal and would like help with this. He also reports feeling down and depressed recently and had thoughts today about ending his life by overdosing on heroin. Denies any other drugs/ingestions today besides heroin and alcohol. Statement of belief was signed by police for SI.      Other than stated above, no additional aggravating or alleviating factors are noted.    Past Medical History:   Diagnosis Date   ??? Anxiety    ??? Bipolar disorder (CMS Dx)    ??? Bronchitis    ??? Depression    ??? Drug abuse and dependence (CMS Dx)     ??? H/O tooth extraction    ??? Hepatitis C    ??? Liver disease Hep C   ??? PTSD (post-traumatic stress disorder)    ??? Substance induced mood disorder (CMS Dx)    ??? Thyroid disease      Past Surgical History:   Procedure Laterality Date   ??? MANDIBLE SURGERY       Patient  reports that he has been smoking cigarettes. He has a 11.00 pack-year smoking history. He has never used smokeless tobacco. He reports current alcohol use. He reports current drug use. Drugs: Heroin, Other-see comments, Marijuana, and Cocaine.  Previous Medications    ABACAVIR-DOLUTEGRAVIR-LAMIVUD (TRIUMEQ) 600-50-300 MG TAB    Take 1 tablet by mouth daily.    METHADONE (DOLOPHINE) 10 MG/5 ML SOLUTION    Take 34 mg by mouth daily.       Allergies:   Allergies as of 02/03/2020   ??? (No Known Allergies)       All nursing notes and triage notes were appropriately reviewed in the course of the creation of this note.     Review of Systems     Review of Systems   Constitutional: Negative for chills and fever.   HENT: Negative for congestion and sore throat.    Eyes: Negative for blurred vision and double vision.   Respiratory: Negative for cough and shortness of breath.  Cardiovascular: Negative for chest pain and leg swelling.   Gastrointestinal: Negative for heartburn, nausea and vomiting.   Genitourinary: Negative for dysuria and urgency.   Skin: Negative for rash.   Neurological: Negative for sensory change, focal weakness, loss of consciousness and headaches.   Psychiatric/Behavioral: Positive for depression, substance abuse and suicidal ideas. Negative for hallucinations.       A complete ROS was performed and is otherwise negative.   Physical Exam     Vitals:    02/03/20 0206   BP: 142/85   BP Location: Left arm   Patient Position: Lying   Pulse: 85   Resp: 18   Temp: 98.4 ??F (36.9 ??C)   TempSrc: Oral   SpO2: 99%       Physical Exam  Vitals signs and nursing note reviewed.   Constitutional:       General: He is not in acute distress.  HENT:      Head:  Normocephalic and atraumatic.      Nose: Nose normal.   Eyes:      Extraocular Movements: Extraocular movements intact.      Pupils: Pupils are equal, round, and reactive to light.   Cardiovascular:      Rate and Rhythm: Normal rate and regular rhythm.      Heart sounds: Normal heart sounds. No murmur.   Pulmonary:      Effort: Pulmonary effort is normal. No respiratory distress.      Breath sounds: Normal breath sounds. No wheezing or rales.   Abdominal:      General: There is no distension.      Palpations: Abdomen is soft.      Tenderness: There is no abdominal tenderness. There is no guarding.   Musculoskeletal:      Right lower leg: No edema.      Left lower leg: No edema.   Skin:     General: Skin is warm and dry.      Comments: Track marks on bilateral arms.    Neurological:      General: No focal deficit present.      Mental Status: He is alert and oriented to person, place, and time.      Comments: Very mild BUE tremor       Diagnostic Studies     Labs:  Labs Reviewed   CBC - Abnormal; Notable for the following components:       Result Value    Hemoglobin 13.0 (*)     All other components within normal limits   HEPATIC FUNCTION PANEL - Abnormal; Notable for the following components:    AST 151 (*)     ALT 109 (*)     All other components within normal limits   ACETAMINOPHEN LEVEL - Abnormal; Notable for the following components:    Acetaminophen Level <10 (*)     All other components within normal limits   SALICYLATE LEVEL - Abnormal; Notable for the following components:    Salicylate Lvl <3 (*)     All other components within normal limits   BASIC METABOLIC PANEL   DIFFERENTIAL   URINE DRUG SCREEN WITHOUT CONFIRMATION, STAT   ED ECG 12-LEAD (MUSE)    Narrative:     Ventricular Rate:  80  BPM  Atrial Rate:  80  BPM  P-R Interval:  130  ms  QRS Duration:  84  ms  QT:  362  ms  QTc:  417  ms  P Axis:  35  degrees  R Axis:  93  degrees  T Axis:  0  degrees  Diagnosis Line:  NORMAL SINUS RHYTHM WITH SINUS  ARRHYTHMIA ^ RIGHTWARD AXIS ^ BORDERLINE ECG       Radiology:  No orders to display       EKG:  Normal sinus rhythm, rate 88.  Normal PR, QRS, QTc intervals.  No ST change. TWI lead III  Emergency Department Procedures       ED Course and MDM     The patient was evaluated by myself, ED Attending Physician, Dr. Clementeen Graham, and R4 Dr. Dorothe Pea. All management and disposition plans were discussed and agreed upon.      ANTRON SETH is a 34 y.o. male with past medical history of ETOH use and withdrawal (reported history of withdrawal seizures), bipolar disorder, HIV on HAART tx (2/21 viral load undetectable, CD4 471), hepatitis C, thyroid disease, IVDU (heroin) presents for evaluation of concern for ETOH withdrawal, and SI.  Patient was just admitted to medicine service earlier yesterday for management of alcohol withdrawal.  He left AMA.  Patient reports he went home, used IV heroin, and then developed shakiness and tremors and felt concerned that he was going to have a seizure.  He drank a couple beers to try to treat his symptoms.  He also reports feeling down and depressed and had thoughts of overdosing on heroin to end his life. He used heroin earlier today but states that use was not a suicide attempt, he was thinking of overdosing later. Statement of belief was signed by police for SI. On evaluation, he is in no acute distress with unremarkable vital signs.  He has mild upper extremity tremor.  No diaphoresis or evidence of toxidrome.  Does not appear to be clinically withdrawing at this time. Denies any drugs/ingestions besides heroin or alcohol today. Psych Child psychotherapist evaluated patient while he was in the emergency room and agrees with plan for [facility] transfer once medically clear.     Labs with no leukocytosis or anemia, normal renal function panel, mild transaminitis with AST of 109, AST of 151 which have downtrended from labs yesterday were AST was in 200s and ALT in 140s.  Abdominal ultrasound was attempted  while admitted yesterday but the patient left prior to this being obtained.  Negative Tylenol and salicylate levels. EKG with no evidence of ischemia or interval change. He was monitored in the ED for several hours and remained in no acute distress with normal vital signs. As he already has script for librium and outpatient follow-up information for [facility] and has no other medical indications for admission at this time, feel he is medically clear for transfer to [facility] for further psychiatric evaluation. Handoff completed  and the patient is accepted at [facility] and EMTALA documentation completed.      Medications received during this ED visit:  Medications - No data to display    Impression     1. Suicidal ideation         Plan     Signed out to Dr. Andria Meuse in stable condition.     Barnetta Hammersmith, MD, PGY-1  [facility]       Barnetta Hammersmith, MD  Resident  02/03/20 [phone]

## 2020-02-03 NOTE — Unmapped (Signed)
The patient is a 34 year old Alexander [name] male, who presents to [facility] per mobile care per stretcher. The patient is referred to [facility] by [facility]. Statement of belief states to have the following issues: Patient came into the police department requesting assistance. Patient stated that he was depressed because of his HIV diagnosis and wanted to overdose to stop the pain. Patient stated he has been drinking but no drugs for approximately 3 days. [name] denies S/H ideation and no A/V hallucinations are reported. Patient talking about help with his drinking. Patient states he was in the hospital recent for help then he left do due to his own cognition. Patient states he has had a prior suicide attempt. Personal effects searched and inventoried per [facility] policy. Food and fluids offered.  Cooperative with admission process. Safety maintained. Will continue to monitor.

## 2020-02-03 NOTE — Unmapped (Signed)
[facility] Psychiatric Emergency  Service Evaluation    Reason for Visit/Chief Complaint: Suicidal        PES Triage Screening:  ABS Score: No data recorded         PSS- Suicide Assessment Score : 1  Scale: 1-2 (Low) ; 3-4 (Medium)   5-6+( High)    Homicide Screen:Over the past 2 weeks, have you had thoughts of killing others?: No, In your lifetime, have you ever attempted to kill others?: No  Suicide Screen: Over the past 2 weeks, have you felt down,depressed, or hopeless?: Yes, Over the past 2 weeks, have you had thoughts of killing yourself?: Yes, In your lifetime,have you ever attempted to kill yourself?: Yes    Suicide Assessment: Did the patient screen positive on both Suicide Screen items-active ideation and past attempt?: 1, Have you been thinking about how you might kill yourself?: 1 (Pt has thoughts of overdosing on heroin), Have you ever been hospitalized for a mental health or substance use problem?: 1, Has drinking or drug abuse ever been a problem for you? : 1, Is the patient irritable,agitated,or aggressive?: 0, Suicide Assessment Score : 1, Are you involved with your: Mental health services ([facility] , no mental health meds , working with [facility] to get into treatment , Pt involved with methadone clinic at [facility]), Do you have stable: Housing (Pt reports he has been provided with a appartment through the [facility] but states he mostly stays on the streets because he does not like to be alone and that is what he is ues to)      Patient History     HPI     CC: I'm trying to quit using    Mr. Londo is a 34 yo M with a h/o anxiety, bipolar I disorder, Hep C, HIV, and substance abuse disorder (EtOH and heroin), presenting to PES from the ED with possible suicidal ideations.     Mr. Potempa explains that yesterday, after leaving AMA from [facility] hospital, he was going through EtOH/heroin withdrawal and feeling alone. He started thinking to OD on fentanyl, told a Emergency planning/management officer, was  brought to [facility] ED for evaluation, and then transferred to [facility]. He denies current suicidal ideation, but he has thought about it in the past and has been admitted to the hospital for SI before. Most recently, his mood has been exacerbated by attempting to quit heroin and EtOH. He is working with [facility], but says he is in a vicious cycle with his dosing regimen. He feels his methadone dose (currently 36 mg) does not controls his cravings and I compensate by drinking; I drink like a case of beer a day which then limits his allotted methadone. While attempting sobriety, Mr. Mcbryar feels like crap on top of having HIV.    When asked why he left [facility] yesterday (admitted for alcohol withdrawal), he notes that he had a panic attack because he didn't get my methadone. After leaving, he drank 2 beers for his cravings before he was brought back by police. He last had methadone on Saturday, and is currently withdrawing from both EtOH and heroin.     Mr. Flaum has an extensive history with suicide. Both of his brothers died from suicide; one in May 23, 2007 by gun the other in 22-May-2008 by OD. His best friend hung himself in 22-May-2009. Mr. Bulger notes that these traumatic instances are what led him to use initially.  Of main concern to Mr. Friedhoff is admission to sobriety program and being given his methadone.    Context: stress and other: detoxication from ETOH and Heroin  Location: Altered mental status of mood  Duration: 1 days.  Severity: moderate.  Associated Symptoms: moderate withdrawal.  Modifying Factors: intoxication ETOH and heroin.  Timing: Worse at night.       Past Psychiatric History: Says he was diagnosed with bipolar depression, PTSD, and major depression. Does not currently have an outpatient mental health provider. Does have a case worker through his methadone clinic.    Hospitalizations: yes - multiple from 2008-2015.    Past suicide attempts: yes - attempted Tylenol OD in 2015. Marland Kitchen     History of violence: yes - prior assult charge and served 6 months in jail.    Substance Use History: Alcohol use (24 cans/day). Heroin/fentanyl use (attempting sobriety with Methadone -- 36 mg/day last on Saturday). Prior h/o MJ, cocaine, acid, and mushrooms.     PMH:       Past Medical History:   Diagnosis Date   ??? Anxiety    ??? Bipolar disorder (CMS Dx)    ??? Bronchitis    ??? Depression    ??? Drug abuse and dependence (CMS Dx)    ??? H/O tooth extraction    ??? Hepatitis C    ??? Liver disease Hep C   ??? PTSD (post-traumatic stress disorder)    ??? Substance induced mood disorder (CMS Dx)    ??? Thyroid disease      I have reviewed the past medical history.  Additional history obtained: no    Social History:    Work History:  Unemployed. Will work side jobs but currently reports it is hard to hold a regular job with drug use and HIV    Social History     Socioeconomic History   ??? Marital status: Single     Spouse name: None   ??? Number of children: None   ??? Years of education: None   ??? Highest education level: None   Occupational History   ??? None   Social Needs   ??? Financial resource strain: None   ??? Food insecurity     Worry: None     Inability: None   ??? Transportation needs     Medical: None     Non-medical: None   Tobacco Use   ??? Smoking status: Current Every Day Smoker     Packs/day: 1.00     Years: 11.00     Pack years: 11.00     Types: Cigarettes   ??? Smokeless tobacco: Never Used   Substance and Sexual Activity   ??? Alcohol use: Yes     Comment: 10/17/17: reports drinking ~1 day per week   ??? Drug use: Yes     Types: Heroin, Other-see comments, Marijuana, Cocaine     Comment: 10/17/17: reports last illicit drug use ~1 month ago   ??? Sexual activity: Yes     Partners: Female, Male     Birth control/protection: Condom, Other-see comments   Lifestyle   ??? Physical activity     Days per week: None     Minutes per session: None   ??? Stress: None   Relationships   ??? Social Wellsite geologist on phone: None     Gets together:  None     Attends religious service: None     Active member of club or organization:  None     Attends meetings of clubs or organizations: None     Relationship status: None   ??? Intimate partner violence     Fear of current or ex partner: None     Emotionally abused: None     Physically abused: None     Forced sexual activity: None   Other Topics Concern   ??? Caffeine Use Yes   ??? Occupational Exposure No   ??? Exercise No   ??? Seat Belt Yes   Social History Narrative   ??? None     I have reviewed the past social history.  Additional history obtained: yes.  Grew up in [place]. Brothers passed in 05/23/2007 and May 22, 2008 by suicide, father died in 05/22/2010 from seizure. Mother and sister live in [place] (sister's [phone]). Unable to live with sister due to trouble getting along with her husband, but family is supportive.  Currently lives alone in an apartment in [place].     Family History:    Family History   Problem Relation Age of Onset   ??? Drug abuse Brother    ??? Suicidality Brother    ??? Hypertension Mother    ??? Transient ischemic attack Mother    ??? Seizures Father    ??? Hypertension Father    ??? Cancer Father    ??? Breast Cancer Father    ??? Suicidality Father    ??? Drug abuse Brother    ??? Suicidality Brother      I have reviewed the past family history.  Additional history obtained: no.    Medications:  Previous Medications    ABACAVIR-DOLUTEGRAVIR-LAMIVUD (TRIUMEQ) 600-50-300 MG TAB    Take 1 tablet by mouth daily.    METHADONE (DOLOPHINE) 10 MG/5 ML SOLUTION    Take 34 mg by mouth daily.       Allergies:   Allergies as of 02/03/2020   ??? (No Known Allergies)       Review of Systems     Review of Systems   Constitutional: Positive for activity change, chills and diaphoresis. Negative for weight gain and weight loss.   Neurological: Positive for dizziness and tremors. Negative for seizures, speech difficulty, light-headedness and numbness.   Psychiatric/Behavioral: Positive for agitation, decreased concentration,  depression, sleep disturbance and suicidal ideas. Negative for behavioral problems, confusion, dysphoric mood, hallucinations and self-injury. The patient is nervous/anxious and is hyperactive.          Physical Exam/Objective Data     ED Triage Vitals [02/03/20 0753]   Vital Signs Group      Temp       Temp src       Heart Rate 91      Heart Rate Source Automatic      Resp 18      SpO2 98 %      BP (!) 143/93      MAP (mmHg)       BP Location Left arm      BP Method Automatic      Patient Position Standing   SpO2 98 %   O2 Device None (Room air)       Physical Exam  Constitutional:       Appearance: He is normal weight. He is toxic-appearing.   HENT:      Head: Normocephalic and atraumatic.      Nose: Nose normal.   Eyes:      Extraocular Movements: Extraocular movements intact.   Neck:  Musculoskeletal: Normal range of motion.   Pulmonary:      Effort: Pulmonary effort is normal.   Musculoskeletal: Normal range of motion.   Neurological:      General: No focal deficit present.      Mental Status: He is alert and oriented to person, place, and time.         Mental Status Exam:     Gait and Muscle Strength:  Normal and Muscle strength intact  Appearance and Behavior: Calm, Cooperative, Open Historian and tremulous. Could not sit skill, right leg consistently tapping.       Poor grooming and hygiene and Sloppy, disheveled, caucasian male. Tattoos of deceased brother's names on right forearm. Track marks on arms.   Speech: NL articulation, prosody, volume and production  Language: Naming intact  Mood: just anxious. I don't really wanna die; I just don't wanna feel like crap  Affect: flattened  Thought Process and Associations: goal directed       No loose associations  Thought Content: no suicidal/homicidal with plans for the future. Future-oriented to sobriety and maintaining HIV medications  Abnormal or psychotic thoughts: None  Orientation: person, place, time/date, situation and stated date of February 03, 2020 around 9 AM  Memory: recent, remote, and immediate recall intact  Attention and Concentration: intact. Able to spell th word world forwards and backwards.   Abstraction: Abstraction normal  Fund of Knowledge: average  Insight and Judgement: Full     Fair        Labs:    Please see electronic medical record for any tests performed in the ED.    Recent Results (from the past 24 hour(s))   Urine Drug Screen w/o Confirmation,Stat    Collection Time: 02/03/20  2:47 AM   Result Value Ref Range    Amphetamine, 500 ng/mL Cutoff Negative Negative    Barbiturates UR, 300  ng/mL Cutoff Negative Negative    Buprenorphine, 5 ng/mL Cutoff Negative Negative    Benzodiazepines UR, 300 ng/mL Cutoff Negative Negative    Cocaine UR, 300 ng/mL Cutoff Negative Negative    Methadone, UR, 300 ng/mL Cutoff Presumptive Positive (A) Negative    Opiates UR, 300 ng/mL Cutoff Negative Negative    Oxycodone, 100 ng/mL Cutoff Negative Negative    Tricyclic Antidepressants, 300 ng/mL Cutoff Negative Negative    THC UR, 50 ng/mL Cutoff Negative Negative    Fentanyl, 2 ng/mL Cutoff Presumptive Positive (A) Negative   Basic metabolic panel    Collection Time: 02/03/20  2:56 AM   Result Value Ref Range    Sodium 137 133 - 146 mmol/L    Potassium 4.2 3.5 - 5.3 mmol/L    Chloride 103 98 - 110 mmol/L    CO2 25 21 - 33 mmol/L    Anion Gap 9 3 - 16 mmol/L    BUN 8 7 - 25 mg/dL    Creatinine 1.61 0.96 - 1.30 mg/dL    Glucose 92 70 - 045 mg/dL    Calcium 8.8 8.6 - 40.9 mg/dL    Osmolality, Calculated 282 278 - 305 mOsm/kg    eGFR AA CKD-EPI >90 See note.    eGFR NONAA CKD-EPI >90 See note.   CBC    Collection Time: 02/03/20  2:56 AM   Result Value Ref Range    WBC 5.4 3.8 - 10.8 10E3/uL    RBC 4.38 4.20 - 5.80 10E6/uL    Hemoglobin 13.0 (L) 13.2 - 17.1 g/dL  Hematocrit 38.9 38.5 - 50.0 %    MCV 88.9 80.0 - 100.0 fL    MCH 29.6 27.0 - 33.0 pg    MCHC 33.3 32.0 - 36.0 g/dL    RDW 16.1 09.6 - 04.5 %    Platelets 190 140 - 400 10E3/uL    MPV 8.3 7.5 -  11.5 fL   Differential    Collection Time: 02/03/20  2:56 AM   Result Value Ref Range    Neutrophils Relative 67.7 40.0 - 80.0 %    Lymphocytes Relative 21.7 15.0 - 45.0 %    Monocytes Relative 8.4 0.0 - 12.0 %    Eosinophils Relative 1.5 0.0 - 8.0 %    Basophils Relative 0.7 0.0 - 1.0 %    nRBC 0 0 - 0 /100 WBC    Neutrophils Absolute 3,656 1,500 - 7,800 /uL    Lymphocytes Absolute 1,172 850 - 3,900 /uL    Monocytes Absolute 454 200 - 950 /uL    Eosinophils Absolute 81 15 - 500 /uL    Basophils Absolute 38 0 - 200 /uL   Hepatic Function Panel    Collection Time: 02/03/20  2:56 AM   Result Value Ref Range    Total Bilirubin 0.4 0.0 - 1.5 mg/dL    Bilirubin, Direct 0.1 0.0 - 0.4 mg/dL    AST 409 (H) 13 - 39 U/L    ALT 109 (H) 7 - 52 U/L    Alkaline Phosphatase 119 36 - 125 U/L    Total Protein 7.9 6.4 - 8.9 g/dL    Albumin 3.9 3.5 - 5.7 g/dL    Bilirubin, Indirect 0.3 0.0 - 1.1 mg/dL   Acetaminophen level    Collection Time: 02/03/20  2:56 AM   Result Value Ref Range    Acetaminophen Level <10 (L) 10 - 30 ug/mL   Salicylate level    Collection Time: 02/03/20  2:56 AM   Result Value Ref Range    Salicylate Lvl <3 (L) 10 - 30 mg/dL       Radiology and EKG:  No results found.    EKG: Please see electronic medical record for any studies performed in the ED.    Emergency Course and Plan     NAZEER ROMNEY is a 34 y.o. male who presented to the emergency department with Suicidal Ideation.     Mr. Fillingim says this suicidal ideation was in the context of withdrawal. He says he no longer has SI or a plan. His current goal is to be admitted to an inpatient program for sobriety. He is interested in [facility] and was provided with number to call. Mr. Teixeira is motivated and reached out to [facility] himself; however, because he is currently withdrawing from ETOH unable to go to [place]. His other inpatient options do not dispense Methadone. Mr. Tassin has not had Methadone since Saturday, which PES in unable to provide.      Mr. Hali Marry worked with team and came up with a plan:  - Discharge from [facility] and present to [facility] for methadone   - Follow-up outpatient with methadone clinic psychiatrist Dr. Janee Morn  - Outpatient ETOH withdrawal (has Lubrium prescription) with return precautions   - [facility] follow-up for treatment once ETOH withdrawal stabilized       Diagnosis:    Primary psychiatric Diagnosis: substance induced mood disorder   Other psychiatric Diagnoses: bipolar depression, anxiety  Substance Use Diagnoses: alcohol and heroin  Medical Diagnoses: HIV and  HepC      Disposition:      Discharged from the ED. See AVS for prescriptions, followup, and discharge instructions.  No emergency medical condition present at discharge.  Patient not deemed to be an imminent threat of harm to self or others.  Patient has a good safety plan and discharge disposition in place. Risk Factors:   ASSESSMENT FOR IMMINENT AND FUTURE DANGER:    RISK FACTORS:  Patient has the following biopsychosocial risk factors:male, mood disorder, substance use disorder, history of trauma or abuse, chronic medical illness, history of suicide attempts and family history of completed suicide. Patient has the following environmental risk factors:  relational or social loss  Patient has the following social cultural risk factors: barriers to accessing heal care, especially mental health and substance    PROTECTIVE FACTORS:  Patient has the following protective factors against future danger:  access to outpatient mental health treatment, restricted access to highly lethal means of suicide, connections to family and community support, strong established relationships with medical and mental health care professionals and pattern of help seeking behavior when in crisis    OVERALL RISK:    At this time, the risks factors for psychiatric hospitalization (including loss of freedom, demoralization from institutionalization, removal from community supports,  potential of regression in inpatient hospital setting, and impact on financial stability) have been considered and weighed against risks to self or others. It has been determined that patient is safe for discharge.     Overall lifetime risk is moderate for dangerousness;  The patient acute risk is  imminent risk is moderate as the patient is now clinically sober, expressing hope for the future, and has a stable mental status. Additionally the patient has beneficial medications, outpatient follow up, record review that suggests a pattern of behavior indicating patient is safe to treat in a community setting and time spent in PES has shown an improvement in mental status with supportive treatments that indicate patient is stable for discharge.  Based on the risk assessment above patient does not meet criteria for inpatient psychiatric admission as the least restrictive means of treatment. Patient is safe to discharge home.        Summary of rationale for disposition: Mr. Loppnow has a plan for sobriety. He is future-oriented and his sobriety needs (methadone) can be met once discharged from [facility]. Denies SI/HI and plans to care for himself.    Provider completing note: Medical Student, supervised by Attending Dr. Carolanne Grumbling    Patient was in Observation and Treatment Area.     Patient had a completed Statement of Belief during this encounter:yes  , released by attending physician.    Medications given in PES: yes.  Medications prescribed for home or inpatient use: no.  Laboratory work ordered: no.  Other diagnostic studies ordered: no.  Old and/or outside medical records reviewed: yes.  Collateral information contacted: no.  Patient's outside provider contacted: no.    Donata Clay, MS3  02/03/2020      I saw patient with the [facility] Medical Student Donata Clay.  I was present for the evaluation. I did an exam of the patient and reviewed diagnostic testing.  I guided care and was responsible for medical decision making and  treatment planning. I agree with the above documentation and made revisions where appropriate. The key issues are:      Mr. Zacharia showed up for help getting referred for alcohol and opioid use. He met with CD specialist. We offered referral  to inpatient rehab facilities but they apparently do not do a combination of alcohol detox and methadone maintenance; they will do one or other. He would like to stay on methadone as overall has helped him abstain from opioids. His plan is to go to [place] this morning to get dose. He offers that he has already been prescribed a prescription for Librium to help him detox from alcohol so will start this. He also will request to meet with psychiatrist at [facility] to help with this (names Dr. Luciano Cutter as someone he can meet with). He strongly denies thoughts or plans of suicide and said I want to live. He said that he told police he was suicidal because he was frustrated about withdrawal. He is appropriate for outpatient treatment.     Todrick Siedschlag P Arfa Lamarca  02/03/2020  11:01 AM               Meredith Staggers, MD  02/03/20 1104
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
## 2020-02-03 NOTE — Progress Notes (Signed)
CD NOTE: Writer met with pt to discuss treatment options. Pt is currently prescribed methadone and would like to continue with methadone. Writer called [facility]. They do not rx methadone. Pt plan would be to leave here from [facility] and go to [facility] so he can receive his Methadone dose. Pt also stated that he wants to follow up with [facility] for treatment.  Writer notified treatment team. .

## 2020-02-03 NOTE — Unmapped (Signed)
PSW called [facility] to arrange transportation for pt to [facility]; [facility] ETA is 7;30am  Medical staff, [facility] SW, and pt have been advised of ETA.       Della Goo MSW, LISW  Psychiatric Social Worker   [facility] [phone]

## 2020-02-03 NOTE — Unmapped (Addendum)
The patient was seen and evaluated by the physician. A discharge order was written.  The patient was given all discharge paperwork and the discharge information was reviewed and explained to the patient.   The patient stated understanding of the discharge instructions. Safety maintained.

## 2020-02-03 NOTE — Unmapped (Signed)
Patient arrived to [facility] per EMS fo suicidal ideation. Pt states he plans on overdosing. Reports drinking two 24 oz can of beer today but endorses typically drinking a case. Denies any use of drugs today.

## 2020-02-03 NOTE — Unmapped (Signed)
Alcohol Withdrawal  Alcohol withdrawal is a group of symptoms that can develop when a person who drinks heavily and regularly stops drinking or drinks less.  What are the causes?  Heavy and regular drinking can cause chemicals that send signals from the brain to the body (neurotransmitters) to deactivate. Alcohol withdrawal develops when deactivated neurotransmitters reactivate because a person stops drinking or drinks less.  What increases the risk?  The more a person drinks and the longer he or she drinks, the greater the risk of alcohol withdrawal. Severe withdrawal is more likely to develop in someone who:  ?? Had severe alcohol withdrawal in the past.  ?? Had a seizure during a previous episode of alcohol withdrawal.  ?? Is elderly.  ?? Is pregnant.  ?? Has been abusing drugs.  ?? Has other medical problems, including:  ?? Infection.  ?? Heart, lung, or liver disease.  ?? Seizures.  ?? Mental health problems.  What are the signs or symptoms?  Symptoms of this condition can be mild to moderate, or they can be severe.  Mild to moderate symptoms may include:  ?? Fatigue.  ?? Nightmares.  ?? Trouble sleeping.  ?? Depression.  ?? Anxiety.  ?? Inability to think clearly.  ?? Mood swings.  ?? Irritability.  ?? Loss of appetite.  ?? Nausea or vomiting.  ?? Clammy skin.  ?? Extreme sweating.  ?? Rapid heartbeat.  ?? Shakiness.  ?? Uncontrollable shaking (tremor).  Severe symptoms may include:  ?? Fever.  ?? Seizures.  ?? Severe??confusion.  ?? Feeling or seeing things that are not there (hallucinations).  Symptoms usually begin within eight hours after a person stops drinking or drinks less. They can last for weeks.  How is this diagnosed?  Alcohol withdrawal is diagnosed with a medical history and physical exam. Sometimes, urine and blood tests are also done.  How is this treated?  Treatment may involve:  ?? Monitoring blood pressure, pulse, and breathing.  ?? Getting fluids through an IV tube.  ?? Medicine to reduce anxiety.  ?? Medicine to  prevent or control seizures.  ?? Multivitamins and B vitamins.  ?? Having a health care provider check on you daily.  If symptoms are moderate to severe or if there is a risk of severe withdrawal, treatment may be done at a hospital or treatment center.  Follow these instructions at home:  ?? Take medicines and vitamin supplements only as directed by your health care provider.  ?? Do not drink alcohol.  ?? Have someone stay with you or be available if you need help.  ?? Drink enough fluid to keep your urine clear or pale yellow.  ?? Consider joining a 12-step program or another alcohol support group.  Contact a health care provider if:  ?? Your symptoms get worse or do not go away.  ?? You cannot keep food or water in your stomach.  ?? You are struggling with not drinking alcohol.  ?? You cannot stop drinking alcohol.  Get help right away if:  ?? You have an irregular heartbeat.  ?? You have chest pain.  ?? You have trouble breathing.  ?? You have symptoms of severe withdrawal, such as:  ?? A fever.  ?? Seizures.  ?? Severe confusion.  ?? Hallucinations.  This information is not intended to replace advice given to you by your health care provider. Make sure you discuss any questions you have with your health care provider.  Document Released: 08/31/2005 Document Revised: 03/30/2016 Document  Reviewed: 09/09/2014  Elsevier Interactive Patient Education ?? 2018 [employer].    Discharge Instructions After an Episode of Suicide Threats or Actions    Remove all firearms, weapons (of any kind), or any unneeded medicines that could be used. Identify a support person/advocate and attend follow-up mental health appointments with this person. Be direct and talk openly about suicidal thoughts. Allow expression of feelings. Block all inappropriate internet websites and social media. Get help from agencies that specialize in crisis intervention. Create a personalized safety plan.     The following list are suicide prevention resources available 24  hours a day.  National:    Suicide Prevention Lifeline  1.800.273.TALK (8255)  - The Lifeline provides 24/7, free and confidential support for people in distress, prevention and crisis resources for you or your loved ones, and best practices for professionals.    [facility]:   Crisis Hotline [facility])  (305)647-0808.CARE (2273)   - 24-hour telephone support services specializing in suicide prevention, crisis situations, and family violence     [facility]) Mobile Crisis   [phone]   [address] [place], [place] [address]  - 24 hour psychiatric emergency mobile crisis unit trained to respond to mental health emergencies.      [facility]:  [facility] Crisis Team  [phone]  - 24 hour psychiatric emergency mobile crisis unit trained to respond to mental health emergencies.     [facility]:   [facility] 24-Hour Crisis Hotline  281-568-9224 (SAVE)  - Hotline people can call for support if they are experiencing mental health problems    [facility]:   [facility]  [phone]  - 24 hour psychiatric emergency mobile crisis unit trained to respond to mental health emergencies.      [place]:  [facility] Crisis Hotline  (774)484-0750  - 24-hour telephone support services specializing in suicide prevention, crisis situations, and family violence    Crisis Hotline [facility])  303-323-8663.CARE (2273)   Website: www.[place]berthouse.org  - 24-hour telephone support services specializing in suicide prevention, crisis situations, and family violence    Psychiatric Emergency Services [facility]) Mobile Crisis  [phone]   [address] [place], [place] [address]  - Psychiatric emergency mobile crisis unit trained to respond to mental health emergencies    www.suicidepreventionlifeline.orgSubstance Use/Chemical Dependency Resources     You have been referred to substance use/chemical dependency outpatient treatment services OR a halfway house. It is  recommended that you contact your preferred program as soon as possible to initiate services. The following is a list of outpatient services and halfway houses in [place] area:    Area Wide Resources:  Alcoholics Anonymous  929-151-0717  [address] [place], [place] [address]  Website: www.[place].org    Narcotics Anonymous ([place] and [place])  (548)602-1019  Website: www.[place].com    Addiction [employer]  506-641-5285 or ([phone]  [address]; [place], [place] [address]  - Provides assessment, referral and treatment for individual and families in the [place] area. They can assist with placement. Assessments are available by appointment or on a walk in basis.    [facility] Substance Dependence Program (SUDEP)  [phone]  - Phone 24/7. Intake and Referral; Detoxification Services; Residential Rehabilitation Services; Outpatient Rehabilitation; Dual Diagnosis; Substance Abuse/Posttraumatic Stress Disorder  [facility] Substance use:  [facility]  [phone]   [address]th [address] [place]; [place], [place] [address]   - Intensive outpatient is 3 times a week,  3 hours each time. Program lasts 6-8 weeks.  [facility] ([facility])  [phone]   [address]; [place], [place] [address]   - Detoxification; Community Residential; Outpatient; Intensive Outpatient; Aftercare; Family Program; Relapse Program along with medication assistance.   [facility]  [phone]   [address] [place], [place] [address]   - Outpatient; Intensive Outpatient; Medication Treatment for Opioid [facility]  [phone]   [address]; [place], [place] [address]   - Woman's Community Residential; Intensive Outpatient; Outpatient, Pregnant and Parenting Woman's Residential, Therapeutic Child Care, Substance Abuse/Mental Illness (SAMI).  [facility]  [address]   [address], [place], [place] [address]   - Chemical dependency assessments; Psychiatric evaluations; Adult and Adolescent alcohol and drug education groups; Individual therapy; [facility] conferences  [facility]  [phone]  [address]; [place], [place] [address] (in the [facility])   - A nurse managed clinic specializing in mental health and substance abuse care for the homeless and indigent of [place].      [facility]) (508) 330-9153.RHAC   [address]; [place], [place] [address]  - Centralized 24-hour service center. Services include; prevention programs, information and referrals, diagnostic assessments, treatment plans, and outpatient substance abuse counseling.  [facility] [place]  ([phone]  - Outpatient counseling, substance abuse assessment, treatment.    [facility]  [phone]  - Outpatient counseling, substance abuse assessment, treatment.     [facility] [employer]:  [facility])  [phone]  [address] [place], [place] [address]   - 6 month residential [place] based recovery program for homeless men.  [facility]  ([phone]   [address]; [place], [place] [address]   - Transitional housing for homeless men with substance abuse problems; 52 beds. Requires completion of detox program prior to admission call or details.  [facility]  [phone]   [address] [place], [place] No website   - There are 7 rooms total, 4 meetings a week required: 12 step program. Call for details  [facility])  [phone]   [address]; [place], [place] [address]   - Long-term residential recovery program for men struggling with drug or alcohol addictions.         [facility]  9564967038   [address]; [place], [place] [address]   - Men live in two or three bedroom apartments; 50 bed facility. $275/month rent plus must buy own food; Employment within 30 days required. Call for further  details.  [facility]  [phone]   [address]; [place], [place] [address]   - 25 unit permanent supportive housing for the chronically homeless with substance abuse problems. Based on a Housing First model, residents will have access to services to help them regain sobriety and independence  [employer]  223-524-7061   [address]; [place], [place] [address] ([place])   - Residential treatment facility for recovering male substance abusers; 60 beds. 90 day minimum stay  [facility]  [phone]   [address]; [place], [place] [address]   - 20 furnished apartments of transitional housing for men and women in recovery from drug and alcohol abuse.   [facility]  [phone]   [address] [place], [place] [address]   - Faith-based, residential homes in [place] operated by [employer] for males needing support to establish life-long recovery from the disease of  addiction/alcoholism.  [facility]  [phone]   [address] [place], [place] [address]  - Safe, affordable, sober housing for men and women recovering from alcohol and drug addiction. 7 houses in the [place] and [address] area. Residents are required to have stable employment or volunteer work.   [facility]  [phone]   [address] [place], [place] [address]  - Housing for women 18+ diagnosed w/ substance abuse; LOS 3-6 months Allows children, ages 24 and under to live with their mothers during treatment.       [facility]  [phone]   [address] [place], [place] [address]   - Faith-based, residential homes in [place] operated by [employer] for females.    [facility] Substance Use:  [facility]) [phone]   - [facility] is a comprehensive alcohol and drug addiction treatment and mental health service provider.  [facility]  [phone]   [address]th [address] [place]; [name], [place] [address] Website:    - Intensive outpatient is 3 times a week, 3 hours each time. Program lasts 6-8 weeks. Call for details  [facility]  [phone]  [address] [place], [place] [address]   - Adult Assessment and Outpatient Services. Individual and group treatment options.  [facility] [place]  [phone]  - Offer both inpatient and outpatient treatment options for mental health and substance abuse.    [facility]  [phone]  - They provide clients with access to psychiatric therapy, medication management, and counseling. Call for details. [facility] Houses:  [employer]  [phone]   [address] [place] [address] Website: http://www.sojournerrecovery.org  - Residential treatment for men, women, and children in recovery. Average length of stay: 3-6 months. There are 7 locations and all referrals go through their executive office   [facility])  [phone]   [address]; [place], [place] [address] Website: http://www.talberthouse.org  - A corrections program that provides assessment, treatment, and reintegration for adult males in a residential setting   [facility] Substance Use Resources:  [facility])  [phone]   [address]; [place], [place] [address]   - Delivers comprehensive, customized care to families and individuals suffering from substance abuse, mental illness and co-occurring illnesses.   [facility] [phone]   [address], [place], [place] [address]   - Provides physician-driven outpatient drug and alcohol treatment for adolescents and adults. Offers outpatient rehab, family counseling, and after care.   [facility]  [phone]   [address], [place], [place] [address]   - Provides addiction services, integrated counseling, mental health counseling, case management, and Psychiatry services. [facility] Houses:  [place].  [phone]   [address]; [place], [place] [address]   Website: http://davis-dillon.net/  - [facility] [place]: 7 -12 month faith based drug and alcohol residential treatment center for men ages 63-35   - [facility]: Residential faith based maternity home for pregnant and non-pregnant women in desperate circumstances - addiction, jail, destructive relationship, destitute, etc. ??? We fully subsidize up to 25% of our bed space for truly destitute individuals     [facility] Substance Use:  [facility]  [phone]   [address], [place], [place] [address]   - Pre-hospital screening, ambulatory detox, and short-term crisis consoling for mental health or  chemical dependency. Other locations in [place] and [employer]  [phone]   [address] [place], [place] [address]    - Provides addiction services, integrated counseling, mental health counseling, case management, and Psychiatry services.   [facility]  [phone]   [address]; [place], [place] [address]   - Provides outpatient substance abuse treatment services for adolescents and adults  [facility] [phone]   - Mental health and substance abuse services in various locations throughout [facility] Houses:  [facility]  [phone]   [address] [place], [place] [address]  - Residential facility for women 18+ who are pregnant or dealing with abuse or addictions ??? Provides shelter, food, discipleship training, life skills preparation and job skills readiness     [place] Substance Use:  [facility] [place]  [phone]   [address], [place], [place]  [address]  - Provides addiction services, integrated counseling, case management, and Psychiatry services.         [facility]  [phone]   [address]; [place], [place] [address]   - Provides:  Substance abuse treatment services on a sliding fee scale  [facility] [phone]   [address]; [place], [place] [address] ([phone])   - Outpatient substance abuse counseling offered on a sliding fee scale  [facility]  [phone]  [address] [place], [place] [address]   - Provides alcohol/drug counseling, Intensive Outpatient Programs for women and adolescents, Dual Diagnosis Treatment, and prevention programs  [facility]  [phone]   [address]; [place], [place] [address]   - Medical Detox  [facility]  [phone]   [address] [place], [place] [address]   - 7-10 day Non-Medical Detox program for men and women; 90-day program for men in [place], [place] and 90-day program for women and their children in [place]   - Men's Residential Recovery program; IOP substance abuse treatment; Call for full details. [employer] Houses:  [facility]  [phone]   [name] [place], [place] [address]  - 100-bed residential treatment facility for recovering male substance abusers.  [facility] for [facility]  [phone]   [address]; [place], [place] [address]   - 9-12 month residential recovery program for men based on model used by the [facility] in [place], [place].  [employer] [place]  [phone]  [address] [place], [place]  - Residential treatment group home for recovering male substance abusers  [facility]  [phone]  [address]; [place], [place]                                   - Safe, affordable, sober housing for men and women recovering from alcohol and drug addiction. 7 houses in the [place] and [address] area. Residents are required to have stable employment or volunteer work. Rent is $70/week  [employer] [facility])  [phone]  [address] [place], [place] [address]  - Transitional housing and case management services for adult women and men  recovering from chemical dependency (11 units)

## 2020-02-04 ENCOUNTER — Ambulatory Visit: Payer: PRIVATE HEALTH INSURANCE

## 2020-02-09 DIAGNOSIS — L0291 Cutaneous abscess, unspecified: Secondary | ICD-10-CM

## 2020-02-09 MED ORDER — cephALEXin (KEFLEX) capsule 500 mg
500 | Freq: Once | ORAL | Status: AC
Start: 2020-02-09 — End: 2020-02-09
  Administered 2020-02-10: 04:00:00 500 mg via ORAL

## 2020-02-09 MED ORDER — sulfamethoxazole-trimethoprim (BACTRIM DS) 800-160 mg per tablet
800-160 | ORAL_TABLET | Freq: Two times a day (BID) | ORAL | 0 refills | Status: AC
Start: 2020-02-09 — End: 2020-02-15

## 2020-02-09 MED ORDER — cephALEXin (KEFLEX) 500 MG capsule
500 | ORAL_CAPSULE | Freq: Four times a day (QID) | ORAL | 0 refills | Status: AC
Start: 2020-02-09 — End: 2020-02-15

## 2020-02-09 MED ORDER — sulfamethoxazole-trimethoprim (BACTRIM DS) 800-160 mg per tablet 2 tablet
800-160 | Freq: Once | ORAL | Status: AC
Start: 2020-02-09 — End: 2020-02-09
  Administered 2020-02-10: 04:00:00 2 via ORAL

## 2020-02-09 MED FILL — SULFAMETHOXAZOLE 800 MG-TRIMETHOPRIM 160 MG TABLET: 800-160 800-160 mg | ORAL | Qty: 2

## 2020-02-09 MED FILL — CEPHALEXIN 500 MG CAPSULE: 500 500 MG | ORAL | Qty: 1

## 2020-02-09 NOTE — Unmapped (Signed)
Pt verbalizes understanding of discharge instructions and has been given the opportunity to ask questions.  Due to infection precautions, patient is unable to sign at this time.

## 2020-02-09 NOTE — Unmapped (Signed)
[facility] ED Note    Date of service:  02/09/2020    Reason for Visit: Abscess      Patient History     HPI The patient is a 34 year old male with history of hepatitis C, IV drug use, alcohol abuse, HIV (last CD4 count 471) who presents to the emergency department with bilateral arm abscess.  Patient states he's noticed several areas of swelling along both arms that he is concerned are becoming abscesses.  He has had multiple in the past.  He denies any overlying skin changes or current drainage.  He is also requesting to be admitted for detox.  He did drink alcohol earlier today.  He denies fevers/chills, chest pain, shortness of breath, tremors, nausea or vomiting, or any other complaints.        Past Medical History:   Diagnosis Date   ??? Anxiety    ??? Bipolar disorder (CMS Dx)    ??? Bronchitis    ??? Depression    ??? Drug abuse and dependence (CMS Dx)    ??? H/O tooth extraction    ??? Hepatitis C    ??? Liver disease Hep C   ??? PTSD (post-traumatic stress disorder)    ??? Substance induced mood disorder (CMS Dx)    ??? Thyroid disease        Past Surgical History:   Procedure Laterality Date   ??? MANDIBLE SURGERY         Patient  reports that he has been smoking cigarettes. He has a 11.00 pack-year smoking history. He has never used smokeless tobacco. He reports current alcohol use. He reports current drug use. Drugs: Heroin, Other-see comments, Marijuana, and Cocaine.      Previous Medications    ABACAVIR-DOLUTEGRAVIR-LAMIVUD (TRIUMEQ) 600-50-300 MG TAB    Take 1 tablet by mouth daily.    METHADONE (DOLOPHINE) 10 MG/5 ML SOLUTION    Take 34 mg by mouth daily.       Allergies:   Allergies as of 02/09/2020   ??? (No Known Allergies)       Review of Systems     Review of Systems   Constitutional: Negative for chills and fever.   Respiratory: Negative for shortness of breath.    Cardiovascular: Negative for chest pain.   Gastrointestinal: Negative for abdominal pain, nausea and  vomiting.   Skin: Negative for color change and wound.   Neurological: Negative for dizziness, tremors and syncope.   All other systems reviewed and are negative.          Physical Exam     ED Triage Vitals [02/09/20 2219]   Vital Signs Group      Temp 98.2 ??F (36.8 ??C)      Temp Source Oral      Heart Rate 92      Heart Rate Source       Resp 16      SpO2 96 %      BP (!) 178/114      MAP (mmHg) 128      BP Location Right arm      BP Method Automatic      Patient Position Sitting   SpO2 96 %   O2 Device        Physical Exam   Constitutional: No distress.   HENT:   Head: Normocephalic and atraumatic.   Eyes: Conjunctivae are normal.   Neck: Normal range of motion. Neck supple.   Cardiovascular: Normal rate, regular rhythm and  normal heart sounds.   No murmur heard.  Pulmonary/Chest: Effort normal.   Neurological: He is oriented to person, place, time and situation.  Moves all extremities spontaneosly and symmetrically.  Gait is normal without ataxia.    No tremors   Skin: Skin is warm and dry. He is not diaphoretic. No erythema.   2cm indurated area on the dorsal aspect of the right forearm and a 1cm indurated area on the left lateral forearm, no areas of fluctuance, no overlying skin erythema  Track marks along both arms  2+ radial pulses         Diagnostic Studies     Labs:    No tests were performed during this ED visit    Radiology:    No tests were performed during this ED visit    EKG:    No EKG Performed    Emergency Department Procedures     Procedures    ED Course and MDM     CHASON MCIVER is a 34 y.o. male who presented to the emergency department with Abscess  On exam the patient is well-appearing and in no acute distress.  Patient has 2 areas of swelling on bilateral arms that are indurated.  There is no fluctuance or overlying erythema.  These areas are not ready for incision and drainage but we'll place the patient on antibiotics.  He is instructed to apply warm compresses to the areas.  He likely  developed these to to IV drug use.  No evidence of necrotizing fasciitis.  Patient percent that we do not admit for detox.  He does not appear to be withdrawing at this time as he is not diaphoretic or tremulous.  Vital signs are currently stable.  He was given resources by social work and a referral was made to addiction services.  The patient was otherwise given return precautions.  He is hemodynamically stable and ready for discharge.            Critical Care Time (Attendings)          Renda Rolls, [place]  02/09/20 2329

## 2020-02-09 NOTE — ED Triage Notes (Signed)
Pt c/o abscesses on both arms.

## 2020-02-09 NOTE — Unmapped (Addendum)
Take antibiotics to completion.  Apply warm compresses to the areas on your arms to help with the swelling.  We unfortunately do not admit people for detox.  Please contact one of the rehab places given to you by the social worker.

## 2020-02-10 ENCOUNTER — Inpatient Hospital Stay
Admission: EM | Admit: 2020-02-10 | Discharge: 2020-02-15 | Disposition: A | Discharge status: Home / Self Care | Payer: PRIVATE HEALTH INSURANCE

## 2020-02-10 ENCOUNTER — Inpatient Hospital Stay
Admit: 2020-02-10 | Discharge: 2020-02-10 | Disposition: A | Discharge status: Home / Self Care | Payer: PRIVATE HEALTH INSURANCE

## 2020-02-10 DIAGNOSIS — F10239 Alcohol dependence with withdrawal, unspecified: Secondary | ICD-10-CM

## 2020-02-10 LAB — DIFFERENTIAL
Basophils Absolute: 27 /uL (ref 0–200)
Basophils Relative: 0.5 % (ref 0.0–1.0)
Eosinophils Absolute: 54 /uL (ref 15–500)
Eosinophils Relative: 1 % (ref 0.0–8.0)
Lymphocytes Absolute: 1312 /uL (ref 850–3900)
Lymphocytes Relative: 24.3 % (ref 15.0–45.0)
Monocytes Absolute: 454 /uL (ref 200–950)
Monocytes Relative: 8.4 % (ref 0.0–12.0)
Neutrophils Absolute: 3553 /uL (ref 1500–7800)
Neutrophils Relative: 65.8 % (ref 40.0–80.0)
nRBC: 0 /100 WBC (ref 0–0)

## 2020-02-10 LAB — BASIC METABOLIC PANEL
Anion Gap: 11 mmol/L (ref 3–16)
BUN: 4 mg/dL (ref 7–25)
CO2: 24 mmol/L (ref 21–33)
Calcium: 9.5 mg/dL (ref 8.6–10.3)
Chloride: 101 mmol/L (ref 98–110)
Creatinine: 0.65 mg/dL (ref 0.60–1.30)
Glucose: 95 mg/dL (ref 70–100)
Osmolality, Calculated: 279 mOsm/kg (ref 278–305)
Potassium: 4.2 mmol/L (ref 3.5–5.3)
Sodium: 136 mmol/L (ref 133–146)
eGFR AA CKD-EPI: >90 See note.
eGFR NONAA CKD-EPI: >90 See note.

## 2020-02-10 LAB — CBC
Hematocrit: 42.1 % (ref 38.5–50.0)
Hemoglobin: 14 g/dL (ref 13.2–17.1)
MCH: 29.3 pg (ref 27.0–33.0)
MCHC: 33.2 g/dL (ref 32.0–36.0)
MCV: 88.3 fL (ref 80.0–100.0)
MPV: 8.2 fL (ref 7.5–11.5)
Platelets: 199 10*3/uL (ref 140–400)
RBC: 4.77 10*6/uL (ref 4.20–5.80)
RDW: 14.4 % (ref 11.0–15.0)
WBC: 5.4 10*3/uL (ref 3.8–10.8)

## 2020-02-10 LAB — 2019 NOVEL CORONAVIRUS (COVID-19), NAA-B: SARS-CoV-2: NOT DETECTED

## 2020-02-10 LAB — PHOSPHORUS: Phosphorus: 3.3 mg/dL (ref 2.1–4.7)

## 2020-02-10 LAB — MAGNESIUM: Magnesium: 2.2 mg/dL (ref 1.5–2.5)

## 2020-02-10 MED ORDER — abacavir-dolutegravir-lamivud (TRIUMEQ) 600-50-300 mg tablet 1 tablet
600-50-300 | Freq: Every day | ORAL | Status: AC
Start: 2020-02-10 — End: 2020-02-15
  Administered 2020-02-11 – 2020-02-15 (×5): 1 via ORAL

## 2020-02-10 MED ORDER — cephALEXin (KEFLEX) capsule 500 mg
500 | Freq: Four times a day (QID) | ORAL | Status: AC
Start: 2020-02-10 — End: 2020-02-15
  Administered 2020-02-11 – 2020-02-15 (×18): 500 mg via ORAL

## 2020-02-10 MED ORDER — diazePAM (VALIUM) Syrg 5 mg
5 | Freq: Three times a day (TID) | INTRAMUSCULAR | Status: AC
Start: 2020-02-10 — End: 2020-02-10

## 2020-02-10 MED ORDER — multivitamin with folic acid tablet 1 tablet
400 | Freq: Once | ORAL | Status: AC
Start: 2020-02-10 — End: 2020-02-10
  Administered 2020-02-10: 21:00:00 1 via ORAL

## 2020-02-10 MED ORDER — enoxaparin (LOVENOX) for prophylaxis syringe 40 mg/0.4 mL
40 | Freq: Every day | SUBCUTANEOUS | Status: AC
Start: 2020-02-10 — End: 2020-02-15
  Administered 2020-02-11 – 2020-02-15 (×5): 40 mg via SUBCUTANEOUS

## 2020-02-10 MED ORDER — diazePAM (VALIUM) tablet 5 mg
5 | Freq: Three times a day (TID) | ORAL | Status: AC
Start: 2020-02-10 — End: 2020-02-10

## 2020-02-10 MED ORDER — sodium chloride flush 10 mL
INTRAMUSCULAR | Status: AC
Start: 2020-02-10 — End: 2020-02-15
  Administered 2020-02-11 – 2020-02-15 (×14): 10 mL via INTRAVENOUS

## 2020-02-10 MED ORDER — diazePAM (VALIUM) tablet 10 mg
5 | Freq: Once | ORAL | Status: AC
Start: 2020-02-10 — End: 2020-02-10
  Administered 2020-02-10: 10 mg via ORAL

## 2020-02-10 MED ORDER — diazePAM (VALIUM) tablet 10 mg
5 | Freq: Three times a day (TID) | ORAL | Status: AC
Start: 2020-02-10 — End: 2020-02-15
  Administered 2020-02-11 – 2020-02-15 (×11): 10 mg via ORAL

## 2020-02-10 MED ORDER — LORazepam (ATIVAN) injection 2 mg
2 | INTRAMUSCULAR | Status: AC | PRN
Start: 2020-02-10 — End: 2020-02-15
  Administered 2020-02-11 – 2020-02-15 (×6): 2 mg via INTRAVENOUS

## 2020-02-10 MED ORDER — LORazepam (ATIVAN) injection 2 mg
2 | Freq: Once | INTRAMUSCULAR | Status: AC
Start: 2020-02-10 — End: 2020-02-10
  Administered 2020-02-10: 23:00:00 2 mg via INTRAVENOUS

## 2020-02-10 MED ORDER — LORazepam (ATIVAN) tablet 4 mg
1 | ORAL | Status: AC | PRN
Start: 2020-02-10 — End: 2020-02-10

## 2020-02-10 MED ORDER — LORazepam (ATIVAN) tablet 2 mg
1 | ORAL | Status: AC | PRN
Start: 2020-02-10 — End: 2020-02-10

## 2020-02-10 MED ORDER — acetaminophen (TYLENOL) tablet 650 mg
325 | ORAL | Status: AC | PRN
Start: 2020-02-10 — End: 2020-02-15
  Administered 2020-02-11 – 2020-02-12 (×4): 650 mg via ORAL

## 2020-02-10 MED ORDER — methadone (DOLOPHINE) 5 mg/5 mL oral solution 34 mg
5 | Freq: Every day | ORAL | Status: AC
Start: 2020-02-10 — End: 2020-02-12
  Administered 2020-02-11 – 2020-02-12 (×2): 34 mg via ORAL

## 2020-02-10 MED ORDER — flu vaccine (seasonal Age 64 or LESS)(PF) IM syringe 0.5 mL
Freq: Once | INTRAMUSCULAR | Status: AC
Start: 2020-02-10 — End: 2020-02-11

## 2020-02-10 MED ORDER — diazePAM (VALIUM) Syrg 10 mg
5 | Freq: Three times a day (TID) | INTRAMUSCULAR | Status: AC
Start: 2020-02-10 — End: 2020-02-15
  Administered 2020-02-11 – 2020-02-15 (×3): 10 mg via INTRAVENOUS

## 2020-02-10 MED ORDER — sulfamethoxazole-trimethoprim (BACTRIM DS) 800-160 mg per tablet 2 tablet
800-160 | Freq: Two times a day (BID) | ORAL | Status: AC
Start: 2020-02-10 — End: 2020-02-15
  Administered 2020-02-11 – 2020-02-15 (×10): 2 via ORAL

## 2020-02-10 MED ORDER — thiamine mononitrate (VITAMIN B-1) tablet 100 mg
100 | Freq: Every day | ORAL | Status: AC
Start: 2020-02-10 — End: 2020-02-13
  Administered 2020-02-11 – 2020-02-12 (×2): 100 mg via ORAL

## 2020-02-10 MED ORDER — diazePAM (VALIUM) tablet 10 mg
5 | Freq: Once | ORAL | Status: AC
Start: 2020-02-10 — End: 2020-02-10
  Administered 2020-02-10: 21:00:00 10 mg via ORAL

## 2020-02-10 MED ORDER — LORazepam (ATIVAN) injection 4 mg
2 | INTRAMUSCULAR | Status: AC | PRN
Start: 2020-02-10 — End: 2020-02-15
  Administered 2020-02-11 – 2020-02-15 (×16): 4 mg via INTRAVENOUS

## 2020-02-10 MED ORDER — folic acid (FOLVITE) tablet 1 mg
1 | Freq: Once | ORAL | Status: AC
Start: 2020-02-10 — End: 2020-02-10
  Administered 2020-02-10: 21:00:00 1 mg via ORAL

## 2020-02-10 MED FILL — DIAZEPAM 5 MG TABLET: 5 5 MG | ORAL | Qty: 2

## 2020-02-10 MED FILL — CEPHALEXIN 500 MG CAPSULE: 500 500 MG | ORAL | Qty: 1

## 2020-02-10 MED FILL — LORAZEPAM 1 MG TABLET: 1 1 MG | ORAL | Qty: 4

## 2020-02-10 MED FILL — METHADONE 5 MG/5 ML ORAL SOLUTION: 5 5 mg/5 mL | ORAL | Qty: 34

## 2020-02-10 MED FILL — LORAZEPAM 2 MG/ML INJECTION SOLUTION: 2 2 mg/mL | INTRAMUSCULAR | Qty: 2

## 2020-02-10 MED FILL — SULFAMETHOXAZOLE 800 MG-TRIMETHOPRIM 160 MG TABLET: 800-160 800-160 mg | ORAL | Qty: 2

## 2020-02-10 MED FILL — LORAZEPAM 2 MG/ML INJECTION SOLUTION: 2 2 mg/mL | INTRAMUSCULAR | Qty: 1

## 2020-02-10 MED FILL — DIAZEPAM 5 MG TABLET: 5 5 MG | ORAL | Qty: 1

## 2020-02-10 MED FILL — FOLIC ACID 1 MG TABLET: 1 1 MG | ORAL | Qty: 1

## 2020-02-10 MED FILL — TRIUMEQ 600 MG-50 MG-300 MG TABLET: 600-50-300 600-50-300 mg | ORAL | Qty: 1

## 2020-02-10 MED FILL — DIAZEPAM 5 MG/ML INJECTION SYRINGE: 5 5 mg/mL | INTRAMUSCULAR | Qty: 2

## 2020-02-10 MED FILL — THERA 400 MCG TABLET: 400 400 mcg | ORAL | Qty: 1

## 2020-02-10 NOTE — ED Provider Notes (Signed)
ED Attending Attestation Note    Date of service:  02/10/2020    This patient was seen by the resident physician.  I have seen and examined the patient, agree with the workup, evaluation, management and diagnosis. The care plan has been discussed and I concur.  I have reviewed the ECG and concur with the resident's interpretation.    My assessment reveals a 34 y.o. male resents with alcohol withdrawal.  Feels shaky tremulousness nausea.  Last drink at 1 a.m.  Mildly tachycardic ciwa 13; will administer benzodiazepines rally pack and reassess

## 2020-02-10 NOTE — Unmapped (Signed)
SUD Team made aware of this patient's presence in the emergency department via:  Request by patient    SUD Team will attempt to meet with this patient while in the emergency department.      Roston Grunewald, PRS  Linkage Coordinator/Peer Recovery Specialist  Early Intervention Program (EIP) - SUD Team  Dept. of Emergency Medicine  [phone]

## 2020-02-10 NOTE — ED Notes (Signed)
Attempted to contact SW. No answer will call again

## 2020-02-10 NOTE — ED Triage Notes (Signed)
Patient complains of anxiety, sweating, trembling, chest flutters due to alcohol withdrawal. Symptoms began 4 hours ago. Last drink of alcohol was at 0100 today. Patient known to have seizures.

## 2020-02-10 NOTE — Unmapped (Signed)
HOSPITAL MEDICINE  HISTORY & PHYSICAL    Name: Alexander [name]  MRN: [id number]  CSN: [id number]    Chief complaint:  Alcohol withdrawal    History of Present Illness     Alexander [name] is an 34 y.o. male with a history of HIV, IV drug abuse, and prior psychiatric admissions with history of SI, depression, opiate abuse, and alcohol abuse who presented to the ED today on 02/10/20 for alcohol withdrawal. Patient with a history of 12 beers/day history for the past 10 years. Patient had been admitted 1 week ago for alcohol withdrawal but had left AMA. During that admission, patient had been noted to be on methadone for heroin use disorder. He was given doxycycline which was completed as he had a right forearm lesion but no evidence of a clear abscess. An abdominal US had been ordered at his prior admission given his transaminitis and high risk for cirrhosis, but this was not obtained prior to patient leaving AMA.    The patient presented to the ED yesterday regarding warmth and erythema to his arm, but there was low suspicion for abscess or cellulitis and he was discharged with instructions to apply warm compression and prescriptions given for oral antibiotics.    The patient presented to the ED today with concerns for symptoms of alcohol withdrawal. The ED noted a CIWA of 13 and gave valium. Patient was noted to be persistently tachycardic in the ED. He was noted to be positive for fentanyl and cocaine on urine drug screen. He was admitted for management of alcohol withdrawal and for rehab placement.      On my assessment, patient states that his last drink and his last use of fentanyl was about 24 hours ago per patient. The patient states that his alcohol withdrawal symptoms have including tremors, diaphoresis, anxiousness, nausea, blurred vision, palpitations described as fluttering, numbness in his fingers and feet, and abdominal pain. He denies any hallucinations. Patient states that he drinks 1 pack of twelve 24 oz  cans/day. He denies drinking any hard liquor. He states that he would be interested in a detox center. He does endorse that he has a history of seizures with alcohol withdrawal and that his last seizure was last week when he came in for his prior admission. He notes that he had left AMA last week because he had felt restless and shaky. He states that he had been on 4 mg ativan every 1-2 hours and had been on 5-10 mg of valium TID during his past admission.     The patient notes that his methadone had recently been increased to 38 mg up from 34. He states that he has been adherent to his triumeq for HIV but has not taken his dose today.     He has a history of HIV and hepatitis C. He denies any past diagnosis of AIDS.    Review of Systems     Review of Systems   Constitutional: Positive for diaphoresis. Negative for chills and fever.   HENT: Negative for congestion, ear discharge, ear pain and sinus pain.    Eyes: Positive for blurred vision. Negative for pain and discharge.   Respiratory: Negative for cough and shortness of breath.    Cardiovascular: Positive for palpitations. Negative for chest pain.   Gastrointestinal: Positive for nausea. Negative for constipation and diarrhea.   Genitourinary: Negative for dysuria and hematuria.   Musculoskeletal: Negative for falls and myalgias.   Skin: Negative for itching and  rash.   Neurological: Positive for tingling and tremors. Negative for dizziness, focal weakness and seizures.   Endo/Heme/Allergies: Does not bruise/bleed easily.   Psychiatric/Behavioral: Negative for hallucinations and suicidal ideas. The patient is nervous/anxious.      A 10-system review was performed and is otherwise negative.       Past Medical & Surgical History     Past Medical History:   Diagnosis Date   ??? Anxiety    ??? Bipolar disorder (CMS Dx)    ??? Bronchitis    ??? Depression    ??? Drug abuse and dependence (CMS Dx)    ??? H/O tooth extraction    ??? Hepatitis C    ??? Liver disease Hep C   ??? PTSD  (post-traumatic stress disorder)    ??? Substance induced mood disorder (CMS Dx)    ??? Thyroid disease        Past Surgical History:   Procedure Laterality Date   ??? MANDIBLE SURGERY           Family History     Family History   Problem Relation Age of Onset   ??? Drug abuse Brother    ??? Suicidality Brother    ??? Hypertension Mother    ??? Transient ischemic attack Mother    ??? Seizures Father    ??? Hypertension Father    ??? Cancer Father    ??? Breast Cancer Father    ??? Suicidality Father    ??? Drug abuse Brother    ??? Suicidality Brother          Social History     Social History     Tobacco Use   ??? Smoking status: Current Every Day Smoker     Packs/day: 1.00     Years: 11.00     Pack years: 11.00     Types: Cigarettes   ??? Smokeless tobacco: Never Used   Substance Use Topics   ??? Alcohol use: Yes     Comment: 10/17/17: reports drinking ~1 day per week   ??? Drug use: Yes     Types: Heroin, Other-see comments, Marijuana, Cocaine     Comment: 10/17/17: reports last illicit drug use ~1 month ago         Medications     Medications Prior to Admission   Medication Sig Dispense Refill Last Dose   ??? abacavir-dolutegravir-lamivud (TRIUMEQ) 600-50-300 mg Tab Take 1 tablet by mouth daily. 30 tablet 5 02/09/2020 at Unknown time   ??? methadone (DOLOPHINE) 10 mg/5 mL solution Take 34 mg by mouth daily.   02/10/2020 at Unknown time   ??? cephALEXin (KEFLEX) 500 MG capsule Take 1 capsule (500 mg total) by mouth 4 times daily before meals and at bedtime for 7 days. 28 capsule 0    ??? sulfamethoxazole-trimethoprim (BACTRIM DS) 800-160 mg per tablet Take 2 tablets by mouth 2 times a day for 7 days. 28 tablet 0           Vital Signs     Temp:  [97.6 ??F (36.4 ??C)-98.2 ??F (36.8 ??C)] 97.7 ??F (36.5 ??C)  Heart Rate:  [73-103] 73  Resp:  [13-25] 20  BP: (117-171)/(75-108) 136/75  No intake or output data in the 24 hours ending 02/10/20 2036      Physical Exam     Physical Exam  Constitutional:       General: He is not in acute distress.     Appearance: Normal  appearance. He is diaphoretic.  HENT:      Head: Normocephalic and atraumatic.      Comments: Poor dentition     Right Ear: External ear normal.      Left Ear: External ear normal.      Mouth/Throat:      Mouth: Mucous membranes are moist.   Eyes:      General: No scleral icterus.        Right eye: No discharge.         Left eye: No discharge.   Cardiovascular:      Rate and Rhythm: Normal rate and regular rhythm.      Heart sounds: No murmur. No gallop.    Pulmonary:      Effort: Pulmonary effort is normal. No respiratory distress.      Breath sounds: No wheezing.   Abdominal:      General: There is no distension.      Palpations: Abdomen is soft.      Tenderness: There is generalized abdominal tenderness.      Comments: Diffuse mild tenderness, but no guarding   Skin:     General: Skin is warm.   Neurological:      General: No focal deficit present.      Mental Status: He is alert and oriented to person, place, and time.      Comments: Tremulous   Psychiatric:         Mood and Affect: Mood is anxious.       Laboratory Data         Lab 02/10/20  1558   WBC 5.4   HEMOGLOBIN 14.0   HEMATOCRIT 42.1   MEAN CORPUSCULAR VOLUME 88.3   PLATELETS 199           Lab 02/10/20  1558   SODIUM 136   POTASSIUM 4.2   CHLORIDE 101   CO2 24   BUN 4*   CREATININE 0.65   GLUCOSE 95           Lab 02/10/20  1558   CALCIUM 9.5   MAGNESIUM 2.2   PHOSPHORUS 3.3       Lab Results   Component Value Date    TSH 1.21 10/17/2017    FREET4 1.08 11/14/2013       Diagnostic Studies       Assessment & Plan     Alexander [name] is a 34 y.o. male being admitted to the hospital for Alcohol Withdrawal.      1. Alcohol withdrawal  - 12 beer/day history  - per patient history of seizures with alcohol withdrawal  Assessment and Plan for 02/10/2020  - CIWA protocol with scheduled valium, prn ativan, and rally pack.    2.  Opiate Addiction  - history of IV drug use, fentanyl use, heroin use  Assessment and Plan for 02/10/2020  - Continue home methadone of 34 mg  daily.    - Will confirm if patient was increased to 38 mg daily as he reported.  - Addiction Medicine consult.    3. Cellulitis  - concern for cellulitis of forearm in ED yesterday  Assessment and Plan for 02/10/2020  - continue prescribed bactrim and keflex for another 6 days    4. HIV  - not diagnosed with AIDS  - follows with Dr. Merilynn Finland  -reports that he is adherent to Triumeq  Assessment and Plan for 02/10/2020  - continue home medication: triumeq    Future Appointments   Date Time Provider Department  Center   02/11/2020 10:20 AM Belva Chimes, MD [facility] ADD [facility]       Entered room at 7:45PM, exited at 8:05 PM. Total time spent at bedside 20 minutes.     By signing my name below, I, Milus Mallick, attest that this documentation has been prepared under the direction and in the presence of Geralynn Ochs, MD, PhD, FACP  Scribe name: Milus Mallick Date: 02/10/2020     ----  Attending Physician Attestation  I certify that the above note was transcribed at my direction and accurately documents my clinical findings and plan for the patient.     Geralynn Ochs, MD, PhD, FACP  Department of Internal Medicine  8:36 PM, 02/10/2020

## 2020-02-10 NOTE — Unmapped (Signed)
Patient will remain free from falls, call light and overbed table within reach, bed in lowest position , wheels locked, room free from clutter, patient educated on fall prevention, verbalizes understanding, will continue to monitor.

## 2020-02-10 NOTE — Unmapped (Signed)
Ready and clean bed assigned to 4R 4161. Pt updated on plan of care including transfer and is agreeable. Receiving RN may call [phone] to consult ED RN with questions regarding patients care. Pt is leaving the department in stable condition with all personal items in possession.   Ordered medications that have been received from Pharmacy will be tubed to receiving unit.  The patient does not have a patient monitor at bedside in the ED.  Most recent vitals: BP (!) 159/97 (BP Location: Right arm, Patient Position: Sitting)    Pulse 99    Temp 97.6 ??F (36.4 ??C) (Oral)    Resp 25    SpO2 98%     Joylene Grapes ,RN

## 2020-02-10 NOTE — Unmapped (Signed)
[facility] ED Note    Date of Service: 02/10/2020  Reason for Visit: Withdrawal-Alcohol      Patient History     HPI  Alexander [name] is a 34 y.o. male with past medical history of anxiety, bipolar disorder, hepatitis C and alcohol abuse (history of withdrawal seizures) who presents to the ED for concerns of alcohol withdrawal.  Patient has a history of withdrawal seizures, recently admitted to the hospital approximately one week ago for withdrawal, patient left AMA.  Patient presented last night with concerns of abscesses of both his right and left forearm, no abscesses appreciated and he was discharged with instructions for warm compresses.  Patient last drank alcohol at approximately 1 AM this morning, started developing withdrawal symptoms 4-5 hours ago including anxiety, tremors, nausea, diaphoresis and overall feeling uneasy.  She states he has drank a 12 pack of beer for the past 5 years.  He last drank a 12 ounce can of 8 percent beer at 1 AM to get through the night.  He has been trying to get into rehabilitation but has been unable to find a rehabilitation facility for at least the next 7-10 days.  He also endorses headache, blurry vision, chest palpitations and numbness of fingertips. He also has had a few episodes of emesis. Patient has initial CIWA score of 13.    Other than stated above, no additional aggravating or alleviating factors are noted.    Past Medical History:   Diagnosis Date   ??? Anxiety    ??? Bipolar disorder (CMS Dx)    ??? Bronchitis    ??? Depression    ??? Drug abuse and dependence (CMS Dx)    ??? H/O tooth extraction    ??? Hepatitis C    ??? Liver disease Hep C   ??? PTSD (post-traumatic stress disorder)    ??? Substance induced mood disorder (CMS Dx)    ??? Thyroid disease      Past Surgical History:   Procedure Laterality Date   ??? MANDIBLE SURGERY       Patient  reports that he has been smoking cigarettes. He has a 11.00 pack-year smoking history. He has never used smokeless tobacco. He reports current  alcohol use. He reports current drug use. Drugs: Heroin, Other-see comments, Marijuana, and Cocaine.  Previous Medications    ABACAVIR-DOLUTEGRAVIR-LAMIVUD (TRIUMEQ) 600-50-300 MG TAB    Take 1 tablet by mouth daily.    CEPHALEXIN (KEFLEX) 500 MG CAPSULE    Take 1 capsule (500 mg total) by mouth 4 times daily before meals and at bedtime for 7 days.    METHADONE (DOLOPHINE) 10 MG/5 ML SOLUTION    Take 34 mg by mouth daily.    SULFAMETHOXAZOLE-TRIMETHOPRIM (BACTRIM DS) 800-160 MG PER TABLET    Take 2 tablets by mouth 2 times a day for 7 days.       Allergies:   Allergies as of 02/10/2020   ??? (No Known Allergies)       All nursing notes and triage notes were appropriately reviewed in the course of the creation of this note.     Review of Systems     Denies fever, chills, chest pain, diarrhea, abdominal pain, and lower extremity edema.    All other systems were reviewed and were negative.    Physical Exam     Vitals:    02/10/20 1342 02/10/20 1534 02/10/20 1724   BP: (!) 171/96 (!) 142/108 (!) 159/97   BP Location: Right arm  Right arm   Patient Position: Sitting  Sitting   Pulse: 101 100 99   Resp: 18 23 25    Temp: 98.2 ??F (36.8 ??C) 97.6 ??F (36.4 ??C)    TempSrc: Oral Oral    SpO2: 100%  98%     General:  Appears somewhat anxious with visible tremors bilaterally  Eyes:  PERRL. EOMI. No discharge from eyes   ENT:  No discharge from nose. OP clear  Neck:  Supple, trachea midline  Pulmonary:   Non-labored breathing. Breath sounds clear bilaterally  Cardiac:  Tachycardic with regular rhythm. No murmurs/rubs/gallops  Abdomen:  Soft. Non-tender. Non-distended. No rebound tenderness, guarding or masses  Musculoskeletal:  No long bone deformity. Erythema over bilateral forearms without fluctuance, edema or raised areas. Full ROM of hands/wrists bilaterally.   Vascular:  Extremities warm and perfused. Normal pulses in all 4 extremities  Skin:  Dry, no rashes  Extremities:  No peripheral edema  Neuro:  Alert and oriented x 4. CN  II-XII intact. 5/5 strength in all 4 extremities. Visible resting tremors bilaterally at rest. Sensation grossly intact to light touch. Speech and mentation normal.      Diagnostic Studies     Labs:  Labs Reviewed   BASIC METABOLIC PANEL - Abnormal; Notable for the following components:       Result Value    BUN 4 (*)     All other components within normal limits   CBC   DIFFERENTIAL   MAGNESIUM   PHOSPHORUS   2019 NOVEL CORONAVIRUS (COVID-19), NAA-B    Narrative:     Does the patient have symptoms of Covid-19 (eg. Fever, dyspnea, cough, loss of smell)?->No       Radiology:  No orders to display       ED Course and MDM     Alexander [name] is a 34 y.o. male with a history and presentation as described above in HPI.  The patient was evaluated by myself and the ED Attending Physician, Dr. Glynis Smiles, and R4 Dr. Hazle Coca. All management and disposition plans were discussed and agreed upon.    Upon presentation, the patient was slightly anxious well appearing and was hypertensive and tachycardic.  Patient with history of alcohol withdrawal seizures who presents the ED for concern with alcohol withdrawal.  Patient's initial CIWA was 13, he was given 10mg  of Valium and basic labs were ordered including CBC, BMP and Mg/phos.  Laboratory work was unremarkable.  On reassessment, patient had CIWA of 18 and was given 2 mg of Ativan. Given the patient's high risk for withdraw with continued elevated CIWA scores, he will be admitted for further evaluation and management of alcohol withdrawal.     Medications received during this ED visit:  Medications   folic acid (FOLVITE) tablet 1 mg (1 mg Oral Given 02/10/20 1549)     And   multivitamin with folic acid tablet 1 tablet (1 tablet Oral Given 02/10/20 1549)     And   thiamine mononitrate (VITAMIN B-1) tablet 100 mg (has no administration in time range)   diazePAM (VALIUM) tablet 10 mg (10 mg Oral Given 02/10/20 1549)   LORazepam (ATIVAN) injection 2 mg (2 mg Intravenous Given 02/10/20 1730)        At this time the patient has been admitted to medicine for further evaluation and management of alcohol withdrawal syndrome. The patient will continue to be monitored here in the emergency department until which time he is moved to his new treatment  location.       Impression     1. Alcohol withdrawal syndrome without complication (CMS Dx)           Stacie Acres, MD, PGY-1  [facility]         Stacie Acres, MD  Resident  02/10/20 [phone]

## 2020-02-10 NOTE — Nursing Note (Signed)
Pt arrived on unit a&Ox4, MD assessed at bedside, scheduled meds and PRN 4mg  ativan given per CIWA score, pt in obvious withdraw, will continue CIWA's and ativan per order.

## 2020-02-10 NOTE — Unmapped (Signed)
EIP PRS met with Pt in the ED lobby per self request. Pt presented to ED with a representative from [facility]. Pt states that they are beginning to withdrawal. Peer support was offered and accepted Pt reports current IV drug use. Pt reports that they drink approx a case of beer a day. Pt reports Hx of seizur when in detox. Pt admitted to [facility]. Linkage prevented at this time. SUD linkage team will continue to monitor and engage in linkage once applicable.       Robie Ridge, PRS  Linkage Coordinator/Peer Recovery Specialist  Early Intervention Program (EIP) - SUD Team  Dept. of Emergency Medicine  [phone]

## 2020-02-11 ENCOUNTER — Ambulatory Visit: Payer: PRIVATE HEALTH INSURANCE

## 2020-02-11 MED ORDER — ondansetron (ZOFRAN) injection 4 mg
4 | Freq: Three times a day (TID) | INTRAMUSCULAR | Status: AC | PRN
Start: 2020-02-11 — End: 2020-02-12
  Administered 2020-02-11 – 2020-02-12 (×4): 4 mg via INTRAVENOUS

## 2020-02-11 MED ORDER — lactated Ringers 1,000 mL bolus
Freq: Once | INTRAVENOUS | Status: AC
Start: 2020-02-11 — End: 2020-02-11
  Administered 2020-02-11: 17:00:00 via INTRAVENOUS

## 2020-02-11 MED FILL — CEPHALEXIN 500 MG CAPSULE: 500 500 MG | ORAL | Qty: 1

## 2020-02-11 MED FILL — LORAZEPAM 2 MG/ML INJECTION SOLUTION: 2 2 mg/mL | INTRAMUSCULAR | Qty: 2

## 2020-02-11 MED FILL — DIAZEPAM 5 MG TABLET: 5 5 MG | ORAL | Qty: 2

## 2020-02-11 MED FILL — METHADONE 5 MG/5 ML ORAL SOLUTION: 5 5 mg/5 mL | ORAL | Qty: 34

## 2020-02-11 MED FILL — TYLENOL 325 MG TABLET: 325 325 mg | ORAL | Qty: 2

## 2020-02-11 MED FILL — SULFAMETHOXAZOLE 800 MG-TRIMETHOPRIM 160 MG TABLET: 800-160 800-160 mg | ORAL | Qty: 2

## 2020-02-11 MED FILL — ONDANSETRON HCL (PF) 4 MG/2 ML INJECTION SOLUTION: 4 4 mg/2 mL | INTRAMUSCULAR | Qty: 2

## 2020-02-11 MED FILL — FLUZONE QUAD 2020-2021 (PF) 60 MCG (15 MCG X 4)/0.5 ML IM SYRINGE: 60 60 mcg (15 mcg x 4)/0.5 mL | INTRAMUSCULAR | Qty: 0.5

## 2020-02-11 MED FILL — VITAMIN B-1 (MONONITRATE) 100 MG TABLET: 100 100 mg | ORAL | Qty: 1

## 2020-02-11 MED FILL — TRIUMEQ 600 MG-50 MG-300 MG TABLET: 600-50-300 600-50-300 mg | ORAL | Qty: 1

## 2020-02-11 MED FILL — DIAZEPAM 5 MG/ML INJECTION SYRINGE: 5 5 mg/mL | INTRAMUSCULAR | Qty: 2

## 2020-02-11 MED FILL — ENOXAPARIN 40 MG/0.4 ML SUBCUTANEOUS SYRINGE: 40 40 mg/0.4 mL | SUBCUTANEOUS | Qty: 0.4

## 2020-02-11 NOTE — Nursing Note (Signed)
Nursing Day Shift Progress Note    Significant Events During Shift  None    Patient/Family Concerns  Visitors: none  Concerns: none    Assessment  Nursing time demands: Moderate  IV access: has IV access, adequate and functioning  Sitter requirements:  no    Mental Status  No data found.    Medications  [phone] - Medications Not Given  (last 12 hrs)         ** SITE UNKNOWN **       Medication Name Action Time Action Reason Comments     flu vaccine (seasonal Age 11 or LESS)(PF) IM syringe 0.5 mL 02/11/20 1150 Not Given Patient/family refused

## 2020-02-11 NOTE — Unmapped (Signed)
Problem: Knowledge Deficit  Goal: Patient/family/caregiver demonstrates understanding of disease process, treatment plan, medications, and discharge instructions  Description: Complete learning assessment and assess knowledge base.  Outcome: Progressing     Patient verbally expressed concern in regards to his Ativan dosages changing. I explained to the patient the evaluation process and the orders in place to assist with withdrawal symptoms and how dosages are based on those orders. Patient expressed verbal understanding. Will continue to monitor.

## 2020-02-11 NOTE — Unmapped (Addendum)
Hospital Medicine  Daily Progress Note      Chief Complaint / Reason for Follow-Up     Alexander [name] is a 34 y.o. male on hospital day 1.  The principal reason for today's follow up visit is Alcohol withdrawal (CMS Dx).      Interval History     No acute events overnight. Placed on CMU for better monitoring of possible seizures. During visit, he reports having nausea and diarrhea. Had one episode of vomiting yesterday. Last alcohol drink was 24hs prior admission. He verbalizes wanting to get into the [facility].       Review of Systems (focused)     Review of Systems   Constitutional: Negative for chills and fever.   Respiratory: Negative for cough and shortness of breath.    Cardiovascular: Negative for chest pain and palpitations.   Gastrointestinal: Positive for diarrhea and nausea. Negative for abdominal pain.   Musculoskeletal: Negative for myalgias.   Neurological: Positive for tremors and headaches.         Medications     Scheduled Meds:  ??? abacavir-dolutegravir-lamivud  1 tablet Oral Daily 0900   ??? cephALEXin  500 mg Oral 4x Daily AC & HS   ??? diazePAM  10 mg Oral TID    Or   ??? diazePAM  10 mg Intravenous TID   ??? enoxaparin  40 mg Subcutaneous Daily 0900   ??? flu vaccine (seasonal Age 65 or LESS)(PF) IM  0.5 mL Intramuscular Once tomorrow morning   ??? methadone  34 mg Oral Daily 0900   ??? sodium chloride  10 mL Intravenous QS   ??? sulfamethoxazole-trimethoprim  2 tablet Oral BID   ??? thiamine  100 mg Oral Daily 0900     Continuous Infusions:  PRN Meds:acetaminophen, LORazepam **OR** LORazepam, ondansetron      Vital Signs     Temp:  [97.6 ??F (36.4 ??C)-98.2 ??F (36.8 ??C)] 97.9 ??F (36.6 ??C)  Heart Rate:  [70-109] 70  Resp:  [13-25] 20  BP: (117-171)/(75-108) 167/99  No intake or output data in the 24 hours ending 02/11/20 0658      Physical Exam     Gen - Alert, no acute distress.   Eyes - Normal conjunctiva, normal pupils, anicteric sclera  ENT - Moist mucosa, no OP erythema or exudates  Neck - Supple, no  thyromegaly  Lymph - No adenopathy cervical or supraclavicular  CV - RRR, no MRG, no elevated JVP, no peripheral edema  Lung - Clear to auscultation anteriorly and posteriorly, normal work of breathing. On room air.   Abd - Positive bowel sounds, soft, mild tenderness to palpation, non distended, no organomegaly  MSK - No clubbing, no cyanosis. Erythema over bilateral forearms without fluctuance, edema or raised areas. Full ROM of hands/wrists bilaterally. Track marks along bilateral arms.   Skin - Warm and dry, no rashes  Neuro - Alert, oriented to self/place/time/situation, no facial asymmetry, no gross focal weakness, sensation grossly intact to light touch. Tremulous  Psych - Anxious, pleasant    Laboratory Data         Lab 02/10/20  1558   WBC 5.4   HEMOGLOBIN 14.0   HEMATOCRIT 42.1   MEAN CORPUSCULAR VOLUME 88.3   PLATELETS 199           Lab 02/10/20  1558   SODIUM 136   POTASSIUM 4.2   CHLORIDE 101   CO2 24   BUN 4*   CREATININE 0.65  GLUCOSE 95           Lab 02/10/20  1558   CALCIUM 9.5   MAGNESIUM 2.2   PHOSPHORUS 3.3                   Invalid input(s): PROTEIN          Invalid input(s): WBCCAST, GRANCAST          No results found for: NTPROBNP    Lab Results   Component Value Date    TSH 1.21 10/17/2017    FREET4 1.08 11/14/2013                   Diagnostic Studies     No results found.     The images associated with the above reports were personally reviewed.      Assessment & Plan     Alexander [name] is a 34 y.o. male on hospital day 1.  The medical issues being addressed in today's encounter are as follows:    Principal Problem:    Alcohol withdrawal (CMS Dx)    # Alcohol withdrawal  Presented to the ED for alcohol withdrawal. Patient with a history of 12 beers/day history for the past 10 years. LAst drink was hours prior to arrival in the ED. History of seizures with alcohol withdrawal in the past, per patient.   - Continue CIWA protocol. Scores ranging 17-20.   - Placed on CMU to monitor possible  seizures  - Valium scheduled, ativan prn and rally pack.   - SW following for alcohol rehab placement ([facility])    # Opiate Addiction   History of IV drug use, fentanyl and heroin.  PTA: Methadone 34mg  daily. Pt reports increased to 38mg  daily.   - Continue home dose. Need to confirm with Methadone clinic the correct amount  - Addiction medicine consult: pending    # Cellulitis   Reports erythema on bilateral forearm for about a week. Was seen in the ED on 03/07 for this problem ans was started on antibiotics.   - Continue Bactrim and keflex (end date 03/13)    # HIV  PTA: Triumeq  HIV on HAART tx (2/21 viral load undetectable, CD4 471). Not diagnosed with AIDS. Follows Dr. Merilynn Finland.   - Continue home meds       DVT ppx: Lovenox     Dispo: Pending improvement of withdrawals. Will return home.     Diet:   Diet Orders          Diet regular starting at 03/08 1956        Code Status: Full Code     Future Appointments   Date Time Provider Department Center   02/11/2020 10:20 AM Belva Chimes, MD [facility] ADD HRV HRV         Casilda Carls, CNP  Department of Internal Medicine  Pager ID [phone])  6:58 AM, 02/11/2020

## 2020-02-11 NOTE — Unmapped (Signed)
Nursing Overnight Progress Note    Significant Events During Shift  CIWA's done PRN ativan given per order    Patient/Family Concerns  Evening/Overnight Visitors: no  Concerns: leg pain Charley horses prn tylenol given    Assessment  Nursing time demands: moderate  IV access: 20 LFA functioning  Sitter requirements:  no    Mental Status  No data found.    Calls to Physician Team  No data found.    Medications  [phone] - Medications Not Given  (last 12 hrs)         ** No medications to display **          Diet/Meals Consumed (past 24 hours)  No data found.

## 2020-02-12 MED ORDER — cloNIDine HCL (CATAPRES) tablet 0.1 mg
0.1 | Freq: Three times a day (TID) | ORAL | Status: AC | PRN
Start: 2020-02-12 — End: 2020-02-15
  Administered 2020-02-12 – 2020-02-15 (×3): 0.1 mg via ORAL

## 2020-02-12 MED ORDER — ondansetron (ZOFRAN) injection 8 mg
4 | Freq: Three times a day (TID) | INTRAMUSCULAR | Status: AC | PRN
Start: 2020-02-12 — End: 2020-02-15
  Administered 2020-02-13 (×2): 8 mg via INTRAVENOUS

## 2020-02-12 MED ORDER — flu vaccine (seasonal Age 64 or LESS)(PF) IM syringe 0.5 mL
Freq: Once | INTRAMUSCULAR | Status: AC
Start: 2020-02-12 — End: 2020-02-13

## 2020-02-12 MED ORDER — ibuprofen (MOTRIN) tablet 800 mg
400 | Freq: Three times a day (TID) | ORAL | Status: AC | PRN
Start: 2020-02-12 — End: 2020-02-15
  Administered 2020-02-13 – 2020-02-15 (×3): 800 mg via ORAL

## 2020-02-12 MED ORDER — loperamide (IMODIUM) capsule 2 mg
2 | ORAL | Status: AC | PRN
Start: 2020-02-12 — End: 2020-02-15
  Administered 2020-02-12 – 2020-02-14 (×5): 2 mg via ORAL

## 2020-02-12 MED ORDER — hydrOXYzine HCL (ATARAX) tablet 50 mg
50 | Freq: Four times a day (QID) | ORAL | Status: AC | PRN
Start: 2020-02-12 — End: 2020-02-15
  Administered 2020-02-13 – 2020-02-15 (×6): 50 mg via ORAL

## 2020-02-12 MED ORDER — methadone (DOLOPHINE) 5 mg/5 mL oral solution 34 mg
5 | Freq: Every day | ORAL | Status: AC
Start: 2020-02-12 — End: 2020-02-14
  Administered 2020-02-13 – 2020-02-14 (×2): 34 mg via ORAL

## 2020-02-12 MED ORDER — methadone (DOLOPHINE) 5 mg/5 mL oral solution 38 mg
5 | Freq: Every day | ORAL | Status: AC
Start: 2020-02-12 — End: 2020-02-12

## 2020-02-12 MED FILL — ONDANSETRON HCL (PF) 4 MG/2 ML INJECTION SOLUTION: 4 4 mg/2 mL | INTRAMUSCULAR | Qty: 2

## 2020-02-12 MED FILL — TYLENOL 325 MG TABLET: 325 325 mg | ORAL | Qty: 2

## 2020-02-12 MED FILL — METHADONE 5 MG/5 ML ORAL SOLUTION: 5 5 mg/5 mL | ORAL | Qty: 34

## 2020-02-12 MED FILL — DIAZEPAM 5 MG TABLET: 5 5 MG | ORAL | Qty: 2

## 2020-02-12 MED FILL — LORAZEPAM 2 MG/ML INJECTION SOLUTION: 2 2 mg/mL | INTRAMUSCULAR | Qty: 1

## 2020-02-12 MED FILL — LORAZEPAM 2 MG/ML INJECTION SOLUTION: 2 2 mg/mL | INTRAMUSCULAR | Qty: 2

## 2020-02-12 MED FILL — CEPHALEXIN 500 MG CAPSULE: 500 500 MG | ORAL | Qty: 1

## 2020-02-12 MED FILL — SULFAMETHOXAZOLE 800 MG-TRIMETHOPRIM 160 MG TABLET: 800-160 800-160 mg | ORAL | Qty: 2

## 2020-02-12 MED FILL — CLONIDINE HCL 0.1 MG TABLET: 0.1 0.1 MG | ORAL | Qty: 1

## 2020-02-12 MED FILL — ENOXAPARIN 40 MG/0.4 ML SUBCUTANEOUS SYRINGE: 40 40 mg/0.4 mL | SUBCUTANEOUS | Qty: 0.4

## 2020-02-12 MED FILL — TRIUMEQ 600 MG-50 MG-300 MG TABLET: 600-50-300 600-50-300 mg | ORAL | Qty: 1

## 2020-02-12 MED FILL — DIAZEPAM 5 MG/ML INJECTION SYRINGE: 5 5 mg/mL | INTRAMUSCULAR | Qty: 2

## 2020-02-12 MED FILL — VITAMIN B-1 (MONONITRATE) 100 MG TABLET: 100 100 mg | ORAL | Qty: 1

## 2020-02-12 MED FILL — LOPERAMIDE 2 MG CAPSULE: 2 2 mg | ORAL | Qty: 1

## 2020-02-12 NOTE — Unmapped (Signed)
ADDICTION PSYCH CONSULT, INITIAL EVALUATION    ASSESSMENT AND RECOMMENDATIONS:    Alcohol use disorder, severe; alcohol withdrawal - Discussed risks, benefits, and options for treatment, expectations for treatment, and limitations of confidentiality. Pt presented for treatment of alcohol withdrawal and hoping to get connected to residential treatment. Pt's outpatient counselor has been working on residential treatment options for the patient, but options are somewhat limited due to concurrent methadone use.   -Agree with use of a base scheduled dose of benzodiazepine + CIWA triggered benzodiazepine for alcohol withdrawal.   --Pt listed as having liver disease, Hep C and has elevated LFTs. No other signs of liver dysfunction (plts, bili, Na wnl from labs at prior admission). If concerns for liver dysfunction, may consider switching base benzodiazepine to non-liver metabolized benzo such as lorazepam, as diazepam can lead to stacking of uncleared metabolites. Note that pt appears to be tolerating well without concerns for oversedation.   -Agree with thiamine administration.   -Pt interested in residential treatment. Pt's outpatient case manager has been exploring options. At this time, [facility] or [facility] may be possibilities.   --Recommend reaching out to [facility] to see if they will accept pt after his is detoxed and on current dose of methadone.   --Per CM, [facility] said they would accept pt, but that he would need to have his methadone reduced to 30mg  and stay at this dose for at least 7 days before they could accept him. If this is still their requirement, this could be possible, but less desirable.     Opioid use disorder, severe, on maintenance therapy; opioid withdrawal - Pt active with [facility] for [facility]adone treatment with dose recently increased from 34mg  to 38mg  on 3/8. Using illicit opioids over methadone dose. Pt reports experiencing withdrawal, but as he also notes, it is difficult to tease out opioid vs  alcohol withdrawal as several symptoms overlap.   -Recommend continuing methadone at 34mg  at this time.   --Holding dose until more is known about possible residential disposition.   -The benzodiazepines above will aid with opioid withdrawal as well. Given currently on methadone and some uncertainty about dosing due to disposition, would recommend sticking to symptomatic management of opioid withdrawal as below.   Clonidine 0.1 mg PO q8 hrs prn opioid withdrawal  Ondansetron 4-8mg  po q8hrs prn nausea  Loperamide 2mg  q2hrs prn diarrhea/loose stools  Hydroxyzine 50 mg PO q6 hrs prn anxiety   Ibuprofen 800 mg PO q8 hrs prn muscle/joint aches    Will continue to follow    [facility]   Pager: [phone]      CLEVON KHADER [phone]     Date/time of admission: 02/10/2020  2:25 PM  Time spent with patient:28min    CC/Reason for Consult: use of methadone, alcohol withdrawal    HPI: Alexander [name] is a 34 y.o. year old male admitted for alcohol withdrawal with addiction consult for use of methadone, alcohol withdrawal.     Alc: pt reports that was not really a drinker until he started trying to get off of opioids approx 5 years ago. He reports that ever since he started trying to get free from opioids, he started replacing it somewhat with alcohol. He reports that he drank a little/occasionally at first and has found his use to escalate over time. He reports that he currently has been drinking a case (#24) 24 oz 8% beers daily. He has difficulty estimating how long he has drank at this level, but guesses  that it may have been for approx 1 year. He reports having withdrawal consisting of shaking, fluttering in his chest, panic, sweats, and numbness in his hands/fingers. Denies h/o ICU admissions, but does note that at times he has felt himself being disoriented. He reports 2 past seizures related to alcohol withdrawal. He has many sequelae to his alcohol use at this time. Has not been able to cut back/quit on  his own. He reports cravings for use. He reports spending much of his time/money drinking. Notes concerns with his health and alcohol use. Notes tolerance as described above and withdrawal as above. Reports he presented to hospital for alcohol treatment. He has made several outreach attempts recently. Last drink was approx 1 day prior to arrival. Currently reports feeling improved in his alcohol withdrawal with treatment. Still noting some shaking and anxious feelings.     Opioids: Pt began use of opioids when he was approx age 42-15 leading to his living on the streets by age 75. He reports using up to 2g/dayof heroin via IV. He has been in treatment with methadone at [facility] since 2019 and did well when his dose was around 70-80mg  and was able to cut back his use of illicit opioids. Reports that his dose was reduced due to alcohol use and he has struggled since then. He reports that he quickly escalated his use of illicit opioids back to 2g/day via IV of heroin/fentanyl. He estimates that this has been about 1 year (per outside chart his dose reduction may have occurred about 7 months ago). He reports last use was about a day prior to presentation. His methadone dose had been decreased over the past few months due to absences from clinic. He had recently had his dose increased from 34mg  to 38mg  (took effect 3/8 so had this dose x 1day). He had requested the dose increase due to having withdrawal symptoms at 34mg . Pt does note that he himself was not sure how much of his symptoms that he was experiencing were alcohol vs opioid withdrawal symptoms.     Can: denies  BZD: denies  Coc: occasional  Amph: denies      Substance Treatment: Was in suboxone treatment for a couple of years in the past. Pt did not feel it was helpful. Currently in methadone at [facility].     Past Psychiatric History:    Prior diagnoses: PTSD   Prior inpatient psychiatric hospitalizations: last in 2015   Outpatient Treatment: Was working with Dr  Lawerance Sabal at [facility], but unclear if he is still in treatment        Past Medical History:      Past Medical History:   Diagnosis Date   ??? Anxiety    ??? Bipolar disorder (CMS Dx)    ??? Bronchitis    ??? Depression    ??? Drug abuse and dependence (CMS Dx)    ??? H/O tooth extraction    ??? Hepatitis C    ??? Liver disease Hep C   ??? PTSD (post-traumatic stress disorder)    ??? Substance induced mood disorder (CMS Dx)    ??? Thyroid disease         Past Surgical History:     Past Surgical History:   Procedure Laterality Date   ??? MANDIBLE SURGERY         Social/Developmental History:    Pt [place] locally. Lives in an apartment obtained through [employer]. Occasionally still sleeps out on the street, as he  has been homeless and living on the streets for years. Has some social support through family and a friend, though his friend does use and his family is in [place] now.     Family History:      Family History   Problem Relation Age of Onset   ??? Drug abuse Brother    ??? Suicidality Brother    ??? Hypertension Mother    ??? Transient ischemic attack Mother    ??? Seizures Father    ??? Hypertension Father    ??? Cancer Father    ??? Breast Cancer Father    ??? Suicidality Father    ??? Drug abuse Brother    ??? Suicidality Brother        Allergies: No Known Allergies    Home Medications:   No current facility-administered medications on file prior to encounter.      Current Outpatient Medications on File Prior to Encounter   Medication Sig Dispense Refill   ??? abacavir-dolutegravir-lamivud (TRIUMEQ) 600-50-300 mg Tab Take 1 tablet by mouth daily. 30 tablet 5   ??? methadone (DOLOPHINE) 10 mg/5 mL solution Take 34 mg by mouth daily.     ??? cephALEXin (KEFLEX) 500 MG capsule Take 1 capsule (500 mg total) by mouth 4 times daily before meals and at bedtime for 7 days. 28 capsule 0   ??? sulfamethoxazole-trimethoprim (BACTRIM DS) 800-160 mg per tablet Take 2 tablets by mouth 2 times a day for 7 days. 28 tablet 0       Current Medications ordered:  Scheduled Meds:  ???  abacavir-dolutegravir-lamivud  1 tablet Oral Daily 0900   ??? cephALEXin  500 mg Oral 4x Daily AC & HS   ??? diazePAM  10 mg Oral TID    Or   ??? diazePAM  10 mg Intravenous TID   ??? enoxaparin  40 mg Subcutaneous Daily 0900   ??? [START ON 02/13/2020] flu vaccine (seasonal Age 90 or LESS)(PF) IM  0.5 mL Intramuscular Once tomorrow morning   ??? [START ON 02/13/2020] methadone  34 mg Oral Daily 0900   ??? sodium chloride  10 mL Intravenous QS   ??? sulfamethoxazole-trimethoprim  2 tablet Oral BID   ??? thiamine  100 mg Oral Daily 0900     Continuous Infusions:  PRN Meds:.acetaminophen, LORazepam **OR** LORazepam, ondansetron    ROS: Reports feeling shaky/having tremor, nausea + emesis on admission, diarrhea, anxiety, sweats. Denies trouble with fever, rash, headache, vision changes, chest pain, shortness of breath, extremity pain, weakness, dysuria.    OBJECTIVE:  Vitals:  Marland Kitchen  Vitals:    02/11/20 1944 02/12/20 0510 02/12/20 0807 02/12/20 1203   BP: 143/85 117/78 134/88 113/69   BP Location: Right arm Right arm Right arm Right arm   Patient Position: Lying Lying Lying Lying   Pulse: 90 69 74 69   Resp: 16 16 18 14    Temp: 97.8 ??F (36.6 ??C) 98.4 ??F (36.9 ??C) 98.1 ??F (36.7 ??C) 97.7 ??F (36.5 ??C)   TempSrc: Oral Oral Oral Oral   SpO2: 100% 100% 100% 100%   Weight:       Height:           Mental Status Exam:   Gait/Station: not assessed 2/2 medical status  Gen/Behav: WD. Appears approx stated age dressed in hospital attire. Appropriate grooming and hygiene. Calm and cooperative with   assessment. Makes appropriate eye contact. Initially lying on side in bed, but sits upright about halfway through assessment. Tremor noted in  hands bilaterally. No psychomotor agitation. No diaphoresis.   Speech: nl rate, volume and production.   Mood/Affect: better, but still a bit anxious/full, congruent, nl range  Thought process:  linear and goal directed, no FOI, no LOA  Thought content: denies SI/HI. No delusions voiced  Perceptions: denies A/V  hallucinations  Cognition: A&Ox4, recent and remote memory grossly intact though does appear to struggle somewhat with timelines, fund of knowledge is average, attention and concentration appropriate to situation.  Judgment & Insight:   Fair/good    OARRS: No flowsheet data found.    Labs:   Complete Blood Count:  Recent Labs     02/10/20  1558   WBC 5.4   HGB 14.0   HCT 42.1   MCV 88.3   PLT 199       Basic Metabolic Panel:  Recent Labs     02/10/20  1558   NA 136   K 4.2   CL 101   CO2 24   BUN 4*   MG 2.2   PHOS 3.3       Hepatic Panel:  No results for input(s): AST, ALT, ALBUMIN in the last 72 hours.    Invalid input(s): TOTALPROTIEN, BILT, BILD     UDS available for review: Not for this visit  Prior visit: 02/03/20   Positive for:   []  benzodiazepines, []  barbiturates, []  cocaine, []  THC, []  amphetamines,   []  methamphetamines, []  opiates, []  oxycodone, [x]  fentanyl, [x]  methadone,  []  buprenorphine, []  MDMA, []  PCP    Imaging:

## 2020-02-12 NOTE — Unmapped (Signed)
UR Nurse: Thornton Park RN  Phone number: [phone]  Fax number: [phone]  Patient status: INPATIENT  Reference#:  [id number]    =========  Patient Information  Comment    Last edited by  on ??at   Patient Name MRN Legal Sex Date of Birth Social Security Number  Alexander [name], Alexander [name] [id number] Male 1986/05/15 [id number]  Email            PCP    RUEGG, HILJA  Demographics  Comment    Last edited by  on ??at   Address: Home Phone: Work Phone: Sport and exercise psychologist: ??  [address]   [place] [address] [phone]  [phone]   SSN: Insurance: Marital Status: Religion: ??  [id number] [facility]   Basic Information    Date Of Birth   Jul 29, 1986 Gender Identity   Male Race   White or Caucasian Ethnicity   Not Hispanic or Latino Preferred Language   English  Documents Filed to Patient    Power of Attorney Living Will Clinical Unknown Study Attachment Consent Form ABN Waiver After Visit Summary Lab Result Scan Code Status My Trion Status Advance Care Planning  Not on File Not on File Not on File Not on File Not on File Not on File Filed Not on File FULL [Updated on 02/10/20 1956] Pending Jump to the Activity??  Auth/Cert Information    Open Auth/Cert linked to Hospital Account [id number]  Admission Information    Current Information    Attending Provider Admitting Provider Admission Type Admission Status  Sherlyn Hay, MD PhD Sherlyn Hay, MD PhD Emergency Admission (Confirmed)  ??     Admission Date/Time Discharge Date Hospital Service Auth/Cert Status  02/10/20 ??02:25 PM  Medicine Incomplete  ??     Hospital Area Unit Room/Bed   [facility]  [facility] [phone] ??  ??     Date and time when first inpatient    Date Time  Feb 10, 2020 ??7:56 PM    ??  Hospital Account    Name Acct ID Class Status Primary Coverage  Mccauley, Diehl [id number] Inpatient Open MOLINA - MOLINA    ??  Guarantor Account (for Hospital Account [id number])    Name Relation to Pt Service Area Active? Acct Type  [name] Self [facility] Yes Personal/Family  Address Phone    [address]   [place], [place] [address] [phone](H)      ??  Coverage Information (for Hospital Account [id number])    F/O Payor/Plan Precert #  MOLINA/MOLINA   Subscriber Subscriber #  Newt, [place] [id number]  Address Phone  [address] [address]   [place], [place] [address]     ??  Emergency Contact Information    Name: Day, Deery Relationship: S  Address: No address given   City:  State:  Zip:  Phone: [phone]   Business phone:   Emergency Contact(s)    Name Relation Home Work Campus Sister [phone]  [phone]  Dyshawn, Cangelosi Mother [phone]    [name]  [phone]   [facility] Data    Account Account Name [facility] Date FPL Exp Date Family Size % FPL  ASPEN, DETERDING [[id number]] ANSEN, SAYEGH Jul 05, 2013   Dec 05, 2012 Oct 04, 2013   Mar 04, 2013  [facility] - HAM 1 100% [11]   [facility] - H[facility] [5]  CLEVON, KHADER [[id number]] KELTIN, BAIRD [[id number]] Romig,Casey D            ==============  Admit to inpatient (Order [id number])  Admission  Date: 02/10/2020 Department: [facility] Ordering/Authorizing: Sherlyn Hay, MD PhD  Diagnosis: Alcohol Withdrawal Syndrome Without Complication (Cms Dx) Hospital Area: [facility] MAIN H[facility]  Level Of Care: Med/Surg Floor Patient Class: Inpatient  Service: Medicine Contact Information: [phone]  Order Questions    Question Answer Comment  Patient Status Inpatient   Level of Care Med/Surg Floor   Hospital Area [facility] MAIN HOSPITAL   Will the patient care be longer than 2 midnights or for an Inpatient-Only surgical procedure? Yes       ==============================      Primary Code Type Code Code Description Present On  Admission    ICD-10 Diagnosis F10.239        Guideline 1 of 1??????M-595?????? GLOS: 2 (DS)?????? Substance-Related Disorders, Adult  [Version: MCG 24th Edition]   Next Review Date: None  Added by: Dorris Carnes, Ellen??on??02/11/2020 8:24 AM GMT  Patient Status:      Clinical  Indications for Admission to Inpatient Care????  [Status : Indications Met]   Most Recent Editor: Dorris Carnes, Ellen??on??02/12/2020 12:23 AM GMT      Admission to Inpatient Level of Care for Substance-Related Disorder for Adult is indicated due to ALL of the following:        Patient risk or severity of substance-related disorder is appropriate to inpatient level of care as indicated by the presence of 1 or more of the following:        Withdrawal signs related to alcohol, sedative, or opioid with high-risk indicators as indicated by 1 or more of the following:        Alcohol or sedative withdrawal signs with high-risk indicator are present as indicated by ALL of the following:        Signs of withdrawal as indicated by 1 or more of the following :        Nausea or vomiting   Text note: SEE CLINICAL NOTE [entered on 02/12/2020 12:23 AM GMT by Dorris Carnes, Ellen]            Tremor   Text note: SEE CLINICAL NOTE [entered on 02/12/2020 12:23 AM GMT by Dorris Carnes, Ellen]            Increased perspiration          Other physical signs of alcohol or sedative withdrawal   Text note: SEE CLINICAL NOTE [entered on 02/12/2020 12:23 AM GMT by Dorris Carnes, Ellen]            Elevated risk due to historical or comorbid factor as indicated by 1 or more of the following :        History of repetitive seizures due to alcohol or sedative withdrawal [H]   Text note: per patient history of seizures with alcohol withdrawal [entered on 02/12/2020 12:23 AM GMT by Dorris Carnes, Ellen]                Treatment services available at proposed level of care are necessary to meet patient needs and 1 or more of the following:        Specific condition related to admission diagnosis is present and judged likely to further improve at proposed level of care.                Situation and expectations are appropriate for inpatient care for adult as indicated by 1 or more of the following:        Around-the-clock medical and nursing care to address symptoms and initiate  intervention is required;  specific need is identified.           Clinical Notes   02/12/2020 12:23 AM GMT??by??Chase Caller  [Subject:]??None Specified  [Note Text:]??    3/8 AT 1342 CIWA SCORE 13: 2 N/V, 2 TACTILE DISTURBANCES, 3 TREMOR, 0 AUDITORY DISTURBANCES, 3 PAROXYSMAL SWEATS, 0 VISUAL DISTURBANCES, 1 ANXIETY, 2 HEADACHE/FULLNESS IN HEAD,   0 AGITATION, 0 ORIENTATION AND CLOUDING OF SENSORIUM    3/8 AT 1534 CIWA SCORE 11: 1 N/V, 2 TACTILE DISTURBANCES, 2 TREMOR, 0 AUDITORY DISTURBANCES, 3 PAROXYSMAL SWEATS, 0 VISUAL DISTURBANCES, 1 ANXIETY, 1 HEADACHE/FULLNESS IN HEAD,   1 AGITATION, 0 ORIENTATION AND CLOUDING OF SENSORIUM    3/8 AT 1700 CIWA SCORE 18: 1 N/V, 2 TACTILE DISTURBANCES, 6 TREMOR, 1 AUDITORY DISTURBANCES, 2 PAROXYSMAL SWEATS, 0 VISUAL DISTURBANCES, 2 ANXIETY, 2 HEADACHE/FULLNESS IN HEAD,   2 AGITATION, 0 ORIENTATION AND CLOUDING OF SENSORIUM    3/8 AT 1832 CIWA SCORE 16: 1 N/V, 2 TACTILE DISTURBANCES, 4 TREMOR, 0 AUDITORY DISTURBANCES, 4 PAROXYSMAL SWEATS, 0 VISUAL DISTURBANCES, 1 ANXIETY, 2 HEADACHE/FULLNESS IN HEAD,   2 AGITATION, 0 ORIENTATION AND CLOUDING OF SENSORIUM    3/8 AT 1958 CIWA SCORE 20: 3 N/V, 3 TACTILE DISTURBANCES, 4 TREMOR, 0 AUDITORY DISTURBANCES, 4 PAROXYSMAL SWEATS, 0 VISUAL DISTURBANCES, 1 ANXIETY, 1 HEADACHE/FULLNESS IN HEAD,   4 AGITATION, 0 ORIENTATION AND CLOUDING OF SENSORIUM    3/9 AT 0100 CIWA SCORE 17: 1 N/V, 3 TACTILE DISTURBANCES,?? 4 TREMOR, 0 AUDITORY DISTURBANCES, 3 PAROXYSMAL SWEATS, 0 VISUAL DISTURBANCES, 3 ANXIETY, 3 HEADACHE/FULLNESS IN HEAD,   0 AGITATION, 0 ORIENTATION AND CLOUDING OF SENSORIUM    3/9 AT 0457 CIWA SCORE 17: 1 N/V, 3 TACTILE DISTURBANCES, 3 TREMOR, 0 AUDITORY DISTURBANCES, 3 PAROXYSMAL SWEATS, 0 VISUAL DISTURBANCES, 1 ANXIETY, 3 HEADACHE/FULLNESS IN HEAD,   3 AGITATION, 0 ORIENTATION AND CLOUDING OF SENSORIUM    3/9 AT 1000 CIWA SCORE 18: 1 N/V, 3 TACTILE DISTURBANCES, 3 TREMOR, 1 AUDITORY DISTURBANCES, 3 PAROXYSMAL SWEATS, 0 VISUAL  DISTURBANCES, 3 ANXIETY, 3 HEADACHE/FULLNESS IN HEAD,   1 AGITATION, 0 ORIENTATION AND CLOUDING OF SENSORIUM    3/9 AT 1300 CIWA SCORE 18: 1 N/V, 2 TACTILE DISTURBANCES, 2 TREMOR, 1 AUDITORY DISTURBANCES, 3 PAROXYSMAL SWEATS, 1 VISUAL DISTURBANCES, 3 ANXIETY, 3 HEADACHE/FULLNESS IN HEAD,   2 AGITATION, 0 ORIENTATION AND CLOUDING OF SENSORIUM    3/9 AT 1434 CIWA SCORE 15: 0 N/V, 2 TACTILE DISTURBANCES, 1 TREMOR, 1 AUDITORY DISTURBANCES, 3 PAROXYSMAL SWEATS, 2 VISUAL DISTURBANCES, 3 ANXIETY, 3 HEADACHE/FULLNESS IN HEAD,   0 AGITATION, 0 ORIENTATION AND CLOUDING OF SENSORIUM    3/9 AT 1622 CIWA SCORE 18: 1 N/V, 2 TACTILE DISTURBANCES, 3 TREMOR, 1 AUDITORY DISTURBANCES, 2 PAROXYSMAL SWEATS, 3 VISUAL DISTURBANCES, 3 ANXIETY, 3 HEADACHE/FULLNESS IN HEAD,   0 AGITATION, 0 ORIENTATION AND CLOUDING OF SENSORIUM    3/9 AT 1839 CIWA SCORE 19: 2 N/V, 2 TACTILE DISTURBANCES, 2 TREMOR, 1 AUDITORY DISTURBANCES, 2 PAROXYSMAL SWEATS, 2 VISUAL DISTURBANCES, 5 ANXIETY, 3 HEADACHE/FULLNESS IN HEAD,   0 AGITATION, 0 ORIENTATION AND CLOUDING OF SENSORIUM         Inpatient Progression     Care Day??1.????Care Date 02/10/2020 ??   Level Of Care: Telemetry  [Status : Guideline Day 1 Met]       Most Recent Editor: Dorris Carnes, Ellen??on??02/12/2020 12:26 AM GMT            Clinical Status   Clinical Indications met [O]   Text note: TELEMETRY [entered on 02/12/2020 12:24 AM GMT by Dorris Carnes,  Ellen]         Interventions   Possible IV fluids, medications    Possible continual observation or frequent safety checks [Q]      Medications   Medication for withdrawal or dependence    Thiamine and multivitamin supplement for alcohol use disorder or dependence [R]      Evaluation   Exploration of admission precipitants [S]    Substance use, psychiatric, medical, and social histories [M]    Mental status and physical examinations    Symptoms assessed multiple times per shift      Therapy   Psychosocial interventions emphasizing admission precipitants [T]   Text note:  SW following for alcohol rehab placement ([facility] house) [entered on 02/12/2020 12:26 AM GMT by Dorris Carnes, Ellen]             Indicia Version: 12.9 Content Version: 24.8 Copyright?? 2021 [facility] All Rights Reserved     =======================      3/8 CARE DATE  MED SURG  HOSPITAL MEDICINE  HISTORY & PHYSICAL  ??  Name: [name]    Chief complaint:  Alcohol withdrawal  ??  History of Present Illness  ??  JULUS KELLEY is an 34 y.o. male with a history of HIV, IV drug abuse, and prior psychiatric admissions with history of SI, depression, opiate abuse, and alcohol abuse who presented to the ED today on 02/10/20 for alcohol withdrawal. Patient with a history of 12 beers/day history for the past 10 years. Patient had been admitted 1 week ago for alcohol withdrawal but had left AMA. During that admission, patient had been noted to be on methadone for heroin use disorder. He was given doxycycline which was completed as he had a right forearm lesion but no evidence of a clear abscess. An abdominal US had been ordered at his prior admission given his transaminitis and high risk for cirrhosis, but this was not obtained prior to patient leaving AMA.  ??  The patient presented to the ED yesterday regarding warmth and erythema to his arm, but there was low suspicion for abscess or cellulitis and he was discharged with instructions to apply warm compression and prescriptions given for oral antibiotics.  ??  The patient presented to the ED today with concerns for symptoms of alcohol withdrawal. The ED noted a CIWA of 13 and gave valium. Patient was noted to be persistently tachycardic in the ED. He was noted to be positive for fentanyl and cocaine on urine drug screen. He was admitted for management of alcohol withdrawal and for rehab placement.    ??  On my assessment, patient states that his last drink and his last use of fentanyl was about 24 hours ago per patient. The patient states that his alcohol withdrawal symptoms have  including tremors, diaphoresis, anxiousness, nausea, blurred vision, palpitations described as fluttering, numbness in his fingers and feet, and abdominal pain. He denies any hallucinations. Patient states that he drinks 1 pack of twelve 24 oz cans/day. He denies drinking any hard liquor. He states that he would be interested in a detox center. He does endorse that he has a history of seizures with alcohol withdrawal and that his last seizure was last week when he came in for his prior admission. He notes that he had left AMA last week because he had felt restless and shaky. He states that he had been on 4 mg ativan every 1-2 hours and had been on 5-10 mg of valium TID during  his past admission.   ??  The patient notes that his methadone had recently been increased to 38 mg up from 34. He states that he has been adherent to his triumeq for HIV but has not taken his dose today.   ??  He has a history of HIV and hepatitis C. He denies any past diagnosis of AIDS.  ??    Review of Systems   Constitutional: Positive for diaphoresis.  Eyes: Positive for blurred vision.   Cardiovascular: Positive for palpitations.  Gastrointestinal: Positive for nausea.  Neurological: Positive for tingling and tremors.  Psychiatric/Behavioral:  The patient is nervous/anxious.        Physical Exam  Constitutional:       Appearance: He is diaphoretic.   Abdominal:      Tenderness: There is generalized abdominal tenderness.      Comments: Diffuse mild tenderness, but no guarding   Neurological:      Comments: Tremulous   Psychiatric:         Mood and Affect: Mood is anxious.     ??  Vital Signs  ??  Temp:  [97.6 ??F (36.4 ??C)-98.2 ??F (36.8 ??C)] 97.7 ??F (36.5 ??C)  Heart Rate:  [73-103] 73  Resp:  [13-25] 20  BP: (117-171)/(75-108) 136/75    BPS INCLUDE:  02/10/20 1836: BP 117/102Abnormal     02/10/20 1832: BP 117/102Abnormal     02/10/20 1724: BP 159/97Abnormal     02/10/20 1534: BP 142/108Abnormal     02/10/20 1342: BP 171/96Abnormal       3/8  HEART RATES: 101 100 99 103 94 73         CIWA SCORES:   3/8 AT 1342 CIWA SCORE 13: 2 N/V, 2 TACTILE DISTURBANCES, 3 TREMOR, 0 AUDITORY DISTURBANCES, 3 PAROXYSMAL SWEATS, 0 VISUAL DISTURBANCES, 1 ANXIETY, 2 HEADACHE/FULLNESS IN HEAD,   0 AGITATION, 0 ORIENTATION AND CLOUDING OF SENSORIUM    3/8 AT 1534 CIWA SCORE 11: 1 N/V, 2 TACTILE DISTURBANCES, 2 TREMOR, 0 AUDITORY DISTURBANCES, 3 PAROXYSMAL SWEATS, 0 VISUAL DISTURBANCES, 1 ANXIETY, 1 HEADACHE/FULLNESS IN HEAD,   1 AGITATION, 0 ORIENTATION AND CLOUDING OF SENSORIUM    3/8 AT 1700 CIWA SCORE 18: 1 N/V, 2 TACTILE DISTURBANCES, 6 TREMOR, 1 AUDITORY DISTURBANCES, 2 PAROXYSMAL SWEATS, 0 VISUAL DISTURBANCES, 2 ANXIETY, 2 HEADACHE/FULLNESS IN HEAD,   2 AGITATION, 0 ORIENTATION AND CLOUDING OF SENSORIUM     3/8 AT 1832 CIWA SCORE 16: 1 N/V, 2 TACTILE DISTURBANCES, 4 TREMOR, 0 AUDITORY DISTURBANCES, 4 PAROXYSMAL SWEATS, 0 VISUAL DISTURBANCES, 1 ANXIETY, 2 HEADACHE/FULLNESS IN HEAD,   2 AGITATION, 0 ORIENTATION AND CLOUDING OF SENSORIUM    3/8 AT 1958 CIWA SCORE 20: 3 N/V, 3 TACTILE DISTURBANCES, 4 TREMOR, 0 AUDITORY DISTURBANCES, 4 PAROXYSMAL SWEATS, 0 VISUAL DISTURBANCES, 1 ANXIETY, 1 HEADACHE/FULLNESS IN HEAD,   4 AGITATION, 0 ORIENTATION AND CLOUDING OF SENSORIUM          LABS:  02/10/2020 15:58  Sodium: 136  Potassium: 4.2  Chloride: 101  Carbon Dioxide (CO2): 24  Anion Gap: 11  BUN: 4 (L)  Creatinine: 0.65  Glucose: 95  eGFR AA CKD-EPI: >90  eGFR NONAA CKD-EPI: >90  Calcium: 9.5  Phosphorus: 3.3  Magnesium: 2.2  Calculated Osmolality, Serum: 279  WBC: 5.4  RBC: 4.77  Hemoglobin: 14.0  Hematocrit: 42.1  MCV: 88.3  MCH: 29.3  MCHC: 33.2  RDW: 14.4  Platelet Count: 199  MPV: 8.2  Neutrophils Relative: 65.8  Lymphocytes Relative: 24.3  Monocytes Relative:  8.4  Eosinophils Relative: 1.0  Basophils Relative: 0.5  nRBC: 0  Neutrophils Absolute: 3,553  Absolute Lymphocytes: 1,312  Monocytes Absolute: 454  Eosinophils Absolute: 54  Basophils Absolute:  27          Medications:      acetaminophen, LORazepam **OR** LORazepam, ondansetron  ??? abacavir-dolutegravir-lamivud  1 tablet Oral Daily 0900   Scheduled Start Date/Time: 02/11/20 0900  ??? cephALEXin  500 mg Oral 4x Daily AC & HS  ??? diazePAM  10 mg Oral TID   Or  ??? diazePAM  10 mg Intravenous TID- PATIENT RECEIVED 1 DOSE  ??? enoxaparin  40 mg Subcutaneous Daily 0900  ??? methadone  34 mg Oral Daily 0900   Scheduled Start Date/Time: 02/11/20 0900  ??? sodium chloride  10 mL Intravenous QS  ??? sulfamethoxazole-trimethoprim  2 tablet Oral BID  ??? thiamine  100 mg Oral Daily 0900    3/8 X1 DOSE:  diazePAM (VALIUM) tablet 10 mg   Ordered Dose: 10 mg Route: Oral Frequency: Once  Administration Dose: 10 mg     Scheduled Start Date/Time: 02/10/20 1839 End Date/Time: 02/10/20 1843 after 1 doses     3/8 X1 DOSE:  diazePAM (VALIUM) tablet 10 mg ??  Ordered Dose: 10 mg Route: Oral Frequency: Once  Administration Dose: 10 mg     Scheduled Start Date/Time: 02/10/20 1432 End Date/Time: 02/10/20 1549 after 1 doses     3/8 X1 DOSE:  folic acid (FOLVITE) tablet 1 mg   Ordered Dose: 1 mg Route: Oral Frequency: Once  Administration Dose: 1 mg     Scheduled Start Date/Time: 02/10/20 1518 End Date/Time: 02/10/20 1549 after 1 doses     3/8 X1 DOSE:  multivitamin with folic acid tablet 1 tablet ??  Ordered Dose: 1 tablet Route: Oral Frequency: Once  Administration Dose: 1 tablet     Scheduled Start Date/Time: 02/10/20 1518 End Date/Time: 02/10/20 1549 after 1 doses     3/8 X1 DOSE:  LORazepam (ATIVAN) injection 2 mg ??   Ordered Dose: 2 mg Route: Intravenous Frequency: Once  Administration Dose: 2 mg     Scheduled Start Date/Time: 02/10/20 1714 End Date/Time: 02/10/20 1730 after 1 doses     *3/8 PATIENT ALSO RECEIVED PRN LORazepam (ATIVAN) injection 4 mg  IVP X1 DOSE            Assessment & Plan  ??  SAGE KOPERA is a 34 y.o. male being admitted to the hospital for Alcohol Withdrawal.  ??  ??  1. Alcohol withdrawal  - 12 beer/day history  - per  patient history of seizures with alcohol withdrawal  Assessment and Plan for 02/10/2020  - CIWA protocol with scheduled valium, prn ativan, and rally pack.  ??  2.  Opiate Addiction  - history of IV drug use, fentanyl use, heroin use  Assessment and Plan for 02/10/2020  - Continue home methadone of 34 mg daily.               - Will confirm if patient was increased to 38 mg daily as he reported.  - Addiction Medicine consult.  ??  3. Cellulitis  - concern for cellulitis of forearm in ED yesterday  Assessment and Plan for 02/10/2020  - continue prescribed bactrim and keflex for another 6 days  ??  4. HIV  - not diagnosed with AIDS  - follows with Dr. Merilynn Finland  -reports that he is adherent to Triumeq  Assessment and Plan for 02/10/2020  -  continue home medication: triumeq  ??    ===========    3/9 CARE DATE  MED [facility] Medicine  Daily Progress Note  ??  ??  Chief Complaint / Reason for Follow-Up  ??  NELSON NOONE is a 34 y.o. male on hospital day 1.  The principal reason for today's follow up visit is Alcohol withdrawal (CMS Dx).  ??  ??  Interval History  ??  No acute events overnight. Placed on CMU for better monitoring of possible seizures. During visit, he reports having nausea and diarrhea. Had one episode of vomiting yesterday. Last alcohol drink was 24hs prior admission. He verbalizes wanting to get into the CAT house.   ??    ??  Review of Systems   Gastrointestinal: Positive for diarrhea and nausea.   Neurological: Positive for tremors and headaches.   ??    MSK - No clubbing, no cyanosis. Erythema over bilateral forearms without fluctuance, edema or raised areas. Full ROM of hands/wrists bilaterally.??Track marks along bilateral arms.   Neuro - Tremulous  Psych - Anxious  ??      Vital Sign Min/Max (last 24 hours)    Value Min Max  Temp 97.7 ??F (36.5 ??C) 98.2 ??F (36.8 ??C)  Heart Rate 69 109  Resp 14 20  BP: Systolic 139 167  BP: Diastolic 85 99Abnormal   SpO2 98 % 100 %     BPS INCLUDE:  02/11/20 0947: BP 151/96Abnormal     02/11/20 0457: BP 167/99Abnormal     02/11/20 0100: BP 139/98Abnormal     02/10/20 1836: BP 117/102Abnormal     02/10/20 1832: BP 117/102Abnormal     02/10/20 1724: BP 159/97Abnormal     02/10/20 1534: BP 142/108Abnormal     02/10/20 1342: BP 171/96Abnormal         3/8 HEART RATES: 101 100 99 103 94 73   3/9 HEART RATES: 109 70 83 94 69 90      CIWA SCORES:  3/9 AT 0100 CIWA SCORE 17: 1 N/V, 3 TACTILE DISTURBANCES,  4 TREMOR, 0 AUDITORY DISTURBANCES, 3 PAROXYSMAL SWEATS, 0 VISUAL DISTURBANCES, 3 ANXIETY, 3 HEADACHE/FULLNESS IN HEAD,   0 AGITATION, 0 ORIENTATION AND CLOUDING OF SENSORIUM     3/9 AT 0457 CIWA SCORE 17: 1 N/V, 3 TACTILE DISTURBANCES, 3 TREMOR, 0 AUDITORY DISTURBANCES, 3 PAROXYSMAL SWEATS, 0 VISUAL DISTURBANCES, 1 ANXIETY, 3 HEADACHE/FULLNESS IN HEAD,   3 AGITATION, 0 ORIENTATION AND CLOUDING OF SENSORIUM    3/9 AT 1000 CIWA SCORE 18: 1 N/V, 3 TACTILE DISTURBANCES, 3 TREMOR, 1 AUDITORY DISTURBANCES, 3 PAROXYSMAL SWEATS, 0 VISUAL DISTURBANCES, 3 ANXIETY, 3 HEADACHE/FULLNESS IN HEAD,   1 AGITATION, 0 ORIENTATION AND CLOUDING OF SENSORIUM    3/9 AT 1300 CIWA SCORE 18: 1 N/V, 2 TACTILE DISTURBANCES, 2 TREMOR, 1 AUDITORY DISTURBANCES, 3 PAROXYSMAL SWEATS, 1 VISUAL DISTURBANCES, 3 ANXIETY, 3 HEADACHE/FULLNESS IN HEAD,   2 AGITATION, 0 ORIENTATION AND CLOUDING OF SENSORIUM     3/9 AT 1434 CIWA SCORE 15: 0 N/V, 2 TACTILE DISTURBANCES, 1 TREMOR, 1 AUDITORY DISTURBANCES, 3 PAROXYSMAL SWEATS, 2 VISUAL DISTURBANCES, 3 ANXIETY, 3 HEADACHE/FULLNESS IN HEAD,   0 AGITATION, 0 ORIENTATION AND CLOUDING OF SENSORIUM    3/9 AT 1622 CIWA SCORE 18: 1 N/V, 2 TACTILE DISTURBANCES, 3 TREMOR, 1 AUDITORY DISTURBANCES, 2 PAROXYSMAL SWEATS, 3 VISUAL DISTURBANCES, 3 ANXIETY, 3 HEADACHE/FULLNESS IN HEAD,   0 AGITATION, 0 ORIENTATION AND CLOUDING OF SENSORIUM    3/9 AT 1839 CIWA SCORE 19: 2  N/V, 2 TACTILE DISTURBANCES, 2 TREMOR, 1 AUDITORY DISTURBANCES, 2 PAROXYSMAL SWEATS, 2 VISUAL DISTURBANCES, 5 ANXIETY, 3 HEADACHE/FULLNESS IN  HEAD,   0 AGITATION, 0 ORIENTATION AND CLOUDING OF SENSORIUM           Medications:      acetaminophen, LORazepam **OR** LORazepam, ondansetron  ??? abacavir-dolutegravir-lamivud  1 tablet Oral Daily 0900  ??? cephALEXin  500 mg Oral 4x Daily AC & HS  ??? diazePAM  10 mg Oral TID   Or  ??? diazePAM  10 mg Intravenous TID- RECEIVED 3 DOSES ON 3/9  ??? enoxaparin  40 mg Subcutaneous Daily 0900  ??? flu vaccine (seasonal Age 52 or LESS)(PF) IM  0.5 mL Intramuscular Once tomorrow morning  ??? methadone  34 mg Oral Daily 0900  ??? sodium chloride  10 mL Intravenous QS  ??? sulfamethoxazole-trimethoprim  2 tablet Oral BID  ??? thiamine  100 mg Oral Daily 0900      3/9 X1 IVF BOLUS:  lactated Ringers 1,000 mL bolus ??  Ordered Dose: -- Route: Intravenous Frequency: Once @ 1,000 mL/hr over 60 Minutes  Volume: 1,000 mL     Scheduled Start Date/Time: 02/11/20 1030 End Date/Time: 02/11/20 1233 after 1 doses       *3/9 PATIENT ALSO RECEIVED PRN LORazepam (ATIVAN) injection 2 mg  IVP X1 DOSE, LORazepam (ATIVAN) injection 4 mg  IVP X5 DOSES, ondansetron (ZOFRAN)  4 mg IVP X2 DOSES,   acetaminophen (TYLENOL) tablet 650 mg PO X1 DOSE    ??    Assessment & Plan  ??  # Alcohol withdrawal  Presented to the ED for alcohol withdrawal. Patient with a history of 12 beers/day history for the past 10 years. LAst drink was hours prior to arrival in the ED. History of seizures with alcohol withdrawal in the past, per patient.   - Continue CIWA protocol. Scores ranging 17-20.   - Placed on CMU to monitor possible seizures  - Valium scheduled, ativan prn and rally pack.   - SW following for alcohol rehab placement ([facility] house)  ??  # Opiate Addiction   History of IV drug use, fentanyl and heroin.  PTA: Methadone 34mg  daily. Pt reports increased to 38mg  daily.   - Continue home dose. Need to confirm with Methadone clinic the correct amount  - Addiction medicine consult: pending  ??  # Cellulitis   Reports erythema on bilateral forearm for about a week. Was seen in the ED  on 03/07 for this problem ans was started on antibiotics.   - Continue Bactrim and keflex (end date 03/13)  ??  # HIV  PTA: Triumeq  HIV on HAART tx (2/21 viral load undetectable, CD4 471). Not diagnosed with AIDS. Follows Dr. Merilynn Finland.   - Continue home meds   ??  ??  DVT ppx: Lovenox   ??  Dispo: Pending improvement of withdrawals. Will return home.   ??  Diet:     Diet Orders   ??    ?? Diet regular starting at 03/08 1956  ??  ??  Code Status: Full Code   ??

## 2020-02-12 NOTE — Unmapped (Signed)
Hospital Medicine  Daily Progress Note      Chief Complaint / Reason for Follow-Up     Alexander [name] is a 34 y.o. male on hospital day 2.  The principal reason for today's follow up visit is Alcohol withdrawal (CMS Dx).      Interval History     No acute events overnight. During visit, appears less tremulous than yesterday. He complains of headache rating 8/10. Addiction services consulted. Continue having diarrhea.     Review of Systems (focused)     Review of Systems   Constitutional: Negative for chills and fever.   Respiratory: Negative for cough and shortness of breath.    Cardiovascular: Negative for chest pain and palpitations.   Gastrointestinal: Positive for diarrhea and nausea. Negative for abdominal pain.   Musculoskeletal: Negative for myalgias.   Neurological: Positive for tremors and headaches.         Medications     Scheduled Meds:  ??? abacavir-dolutegravir-lamivud  1 tablet Oral Daily 0900   ??? cephALEXin  500 mg Oral 4x Daily AC & HS   ??? diazePAM  10 mg Oral TID    Or   ??? diazePAM  10 mg Intravenous TID   ??? enoxaparin  40 mg Subcutaneous Daily 0900   ??? methadone  34 mg Oral Daily 0900   ??? sodium chloride  10 mL Intravenous QS   ??? sulfamethoxazole-trimethoprim  2 tablet Oral BID   ??? thiamine  100 mg Oral Daily 0900     Continuous Infusions:  PRN Meds:acetaminophen, LORazepam **OR** LORazepam, ondansetron      Vital Signs     Temp:  [97.7 ??F (36.5 ??C)-98.4 ??F (36.9 ??C)] (P) 98.1 ??F (36.7 ??C)  Heart Rate:  [69-94] (P) 74  Resp:  [14-18] (P) 18  BP: (117-156)/(78-96) (P) 134/88    Intake/Output Summary (Last 24 hours) at 02/12/2020 0808  Last data filed at 02/11/2020 2016  Gross per 24 hour   Intake 740 ml   Output 351 ml   Net 389 ml         Physical Exam     Gen - Alert, no acute distress.   Eyes - Normal conjunctiva, normal pupils, anicteric sclera  ENT - Moist mucosa, no OP erythema or exudates  Neck - Supple, no thyromegaly  Lymph - No adenopathy cervical or supraclavicular  CV - RRR, no MRG, no  elevated JVP, no peripheral edema  Lung - Clear to auscultation anteriorly and posteriorly, normal work of breathing. On room air.   Abd - Positive bowel sounds, soft, mild tenderness to palpation, non distended, no organomegaly  MSK - No clubbing, no cyanosis. Mild erythema over bilateral forearms without fluctuance, edema or raised areas. Full ROM of hands/wrists bilaterally. Track marks along bilateral arms. 1cm nodule on R forearm, tender to palpation.   Skin - Warm and dry, no rashes  Neuro - Alert, oriented to self/place/time/situation, no facial asymmetry, no gross focal weakness, sensation grossly intact to light touch. Tremulous  Psych - Anxious, pleasant    Laboratory Data         Lab 02/10/20  1558   WBC 5.4   HEMOGLOBIN 14.0   HEMATOCRIT 42.1   MEAN CORPUSCULAR VOLUME 88.3   PLATELETS 199           Lab 02/10/20  1558   SODIUM 136   POTASSIUM 4.2   CHLORIDE 101   CO2 24   BUN 4*   CREATININE 0.65  GLUCOSE 95           Lab 02/10/20  1558   CALCIUM 9.5   MAGNESIUM 2.2   PHOSPHORUS 3.3                   Invalid input(s): PROTEIN          Invalid input(s): WBCCAST, GRANCAST          No results found for: NTPROBNP    Lab Results   Component Value Date    TSH 1.21 10/17/2017    FREET4 1.08 11/14/2013                   Diagnostic Studies     No results found.     The images associated with the above reports were personally reviewed.      Assessment & Plan     Alexander [name] is a 34 y.o. male on hospital day 2.  The medical issues being addressed in today's encounter are as follows:    Principal Problem:    Alcohol withdrawal (CMS Dx)    # Alcohol withdrawal  # Nausea  Presented to the ED for alcohol withdrawal. Patient with a history of 12 beers/day history for the past 10 years. LAst drink was hours prior to arrival in the ED. History of seizures with alcohol withdrawal in the past, per patient.   - Continue CIWA protocol. Scores ranging 17-20.   - Continue CMU to monitor possible seizures  - Valium  scheduled, ativan prn and rally pack.   - SW following for [facility] rehab placement ([facility])  - Increased zofran from 4 to 8mg  q8h prn  - Start hydroxyzine 50mg  q6hs prn for anxiety   - Start Ibuprofen 800mg  q8hs prn for muscle pain.     # Opiate Addiction   History of IV drug use, fentanyl and heroin.  PTA: Methadone 34mg  daily. Pt reports increased to 38mg  daily.   - Continue home dose. Confirmed with Methadone clinic the correct amount dose. He was about to increase his daily dose to 38mg .   - Addiction medicine consult: appreciate recommendations.   ?? Continue methadone 34mg  daily  ?? If necessary add clonidine, zofran, loperamide, hydroxyzine and ibuprofen prn    # Diarrhea   Complains of diarrhea for about 3 days. Likely related to opioid withdrawal.   - loperamide 2mg  q2hs prn     # Cellulitis   Reports erythema on bilateral forearm for about a week. Was seen in the ED on 03/07 for this problem ans was started on antibiotics.   - Continue Bactrim and keflex (end date 03/13)    # HIV  PTA: Triumeq  HIV on HAART tx (2/21 viral load undetectable, CD4 471). Not diagnosed with AIDS. Follows Dr. Merilynn Finland.   - Continue home meds       DVT ppx: Lovenox     Dispo: Pending improvement of withdrawals. Will return home.     Diet:   Diet Orders          Diet regular starting at 03/08 1956        Code Status: Full Code     No future appointments.    A portion of this note was copied forward.  I have reviewed and updated the history, physical exam, data, assessment, and plan of the note so that it reflects the evaluation and management of the patient today.       Casilda Carls,  CNP  Department of Internal Medicine  Pager ID [phone])  8:08 AM, 02/12/2020

## 2020-02-12 NOTE — Unmapped (Signed)
Provided Pastoral support for patient. No further action needed at this time.      Rev. Erlene Quan, CFP, D.Min, LICDC-CS

## 2020-02-12 NOTE — Unmapped (Addendum)
[place]  Care Management/Social Work Assessment      Patient Information     Hospital Day: 2  Inpatient/Observation: Inpatient  Admit Date: 02/10/2020  Admission Diagnosis: Alcohol withdrawal syndrome without complication (CMS Dx) [Z61.096]  Attending provider: Rickey Primus, DO    PCP: Herbert Pun, MD  Home Pharmacy:              [facility] [address], [place] - [address]  [address]  [place] [address]  Phone: [phone]     [facility]  [address] [place] [address]  Phone: [phone]     [facility] [address], [place] - [address]  [address] [place] [address]  Phone: [phone]     [facility] [address] - [place], [place] - [address]  [address] [place] [address]  Phone: [phone]     [facility] [address], [place] - [address] [facility] [address]  [address]  [place] [address]  Phone: [phone]     [facility]  [address]  [address] [place] [address]  Phone: [phone]        Pertinent Medications  Anticoagulation therapy: No  New Diabetic: No      Issues related to obtaining medications: n/a    Payor Information   Medical Insurance Coverage:  Payor: MOLINA / Plan: MOLINA / Product Type: Medicaid Mngd care /   Secondary Payor: n/a    Functional Assessment   Functional Assessment  Assessment Information Obtained From:: Patient  May We Obtain Collateral Information From Family, Friends and Neighbors?: Yes  How do you wish to be addressed?: [name]  Current Mental Status: Awake, Oriented to Place, Oriented to Person, Oriented to Time, Oriented to Situation  Mental Status Prior to Admission: Unable to Assess  Previous Self Harm/Suicidal Ideation: Yes  Suicide Attempts: Yes (comment)  Suicide History Comments: previous admissions for SI  Activities of Daily Living: Independent  Work History: Unemployed  Marital Status: Single  Number of children and their names: 0  Relative  Search Completed: No  Demographics Correct:: Yes  Discharge Destination: Home    Current Living Arrangements   Current Living Arrangements  Current Living Arrangements: Home  Type of Housing: Apartment  Who do you live with?: Alone  One Story or Two (check all that apply): One Financial planner the number of steps and rails to enter the residence: 3 flights  Enter the number of steps and rails inside the residence: 0  History of Falls?: No    Electrical engineer at Home: Not Applicable  Was any abuse reported by patient?: No    Support Systems   Emergency contact: Extended Emergency Contact Information  Primary Emergency Contact: Sueanne Margarita  Address: No address given   [place]  Home Phone: [phone]  Mobile Phone: [phone]  Relation: Sister  Preferred language: English  Interpreter needed? No  Secondary Emergency Contact: Bozich,[name]  Address: no address given   [place]  Home Phone: [phone]  Relation: Mother  Preferred language: English  Interpreter needed? No    Support Systems  Legal Status: N/A  Primary Caregiver: Self  Times of available support: No 24/7 hands on available  Marital Status: Single  Number of children and their names: 0  Relative Search Completed: No  Demographics Correct:: Yes  Discharge Destination: Home  Next of Kin: [name] Evaristo Bury  Next of Kin Relationship:  Mother  Next of Kin Phone Number: [phone]  Assessment Information Obtained From:: Patient    Other Pertinent Information     Patient not interested in advanced directive paperwork.   Advance Directives (For Healthcare)  Advance Directive: Patient does not have advance directive  Healthcare Agent Appointed: No  Pre-existing DNR/DNI Order: No  Patient Requests Assistance: No    Discharge Plan     Met with patient to initiate discussion regarding discharge planning. Introduced self and role of case management/social work and provided Scientist, water quality.  Patient resides in an apartment alone. Patient has 3 flights of steps to get to apartment. Patient has no DME. Patient reports being unemployed with no MH provider. He has multiple MH diagnosis such as anxiety, bipolar, PTSD and depression. Patient suffers from substance abuse concerns (marijuana and cocaine) as well as alcohol dependence. He reports his last use for both was 3-4 days ago. Patient would like help and to be admitted to an inpatient treatment center. Patient is active with [facility] for housing needs. Patient also goes to the Methadone clinic here at [facility]. Patient reports all face sheet demographics and PCP being correct. Mr. Drue Second on patient emergency contacts is an old CM from [place]. Patient reports [facility] to [facility] who had a 7-10 days wait, [facility] who did not do alcohol withdrawal and a [place] facility who does not take Methadone. Patient reports completing an assessment with [facility]. SW called to follow up but intake was at lunch. SW to call back later to see if patient is on the wait list. Patient is in agreement to treatment at [facility]. Per team, patient had a CIWA of 18-20 and does not have DC plans until next week. Patient does not have a photo ID but reports the Methadone clinic here should have it on file. SW found photo ID in chart. Still valid until August. Copy given to the patient. SW spoke to [facility] intake who reports they need a few medical forms and after received, their MD will review (48 hours) then they will know if patient is accepted. SW faxed paperwork to [phone].    Per Dr. Elmarie Shiley with addictions, [facility] told her Monday they will take the patient if he is on 30mg s of Methadone for 7 days but he was just increased to 38mg . [facility] also said no unless patient can be detoxed from alcohol.     SW also reached out to [facility] at [phone] and left a VM or [place]. Patient screened there on 3/1.     Anticipated Discharge Plan:  inpatient [facility] treatment    Anticipated Discharge Date: Monday    Anticipated Transportation: insurance    Patient/Family aware and taking part in the discharge plan.  Patient/family educated that once post-acute care needs have been identified, a provider list applicable to the identified post-acute care needs as well as the insurance provider will be provided, and patient/family have the freedom to choose their provider(s); financial interest(s) are disclosed as appropriate.         Hartford Poli, MSW, LSW  Phone Number: [phone]

## 2020-02-12 NOTE — Unmapped (Signed)
Visitor at bedside this afternoon. Pt had episodes of tachycardia. Highest heart rate 150 for less than 60 seconds. Pt reports increased symptoms of withdrawal. Notified Matesic, DO. New orders placed for PRN medications. Administered Clonidine at 1751. At 1939 pt is resting in bed, eyes closed with a HR of 69.

## 2020-02-12 NOTE — Unmapped (Signed)
Nursing Overnight Progress Note    Significant Events During Shift  No incidents during the shift    Patient/Family Concerns  Evening/Overnight Visitors: none  Concerns: none    Assessment  Nursing time demands: low  IV access: has IV access, adequate and functioning  Sitter requirements:  no    Mental Status  No data found.    Calls to Physician Team  No data found.    Medications  [phone] - Medications Not Given  (last 12 hrs)         ** No medications to display **          Diet/Meals Consumed (past 24 hours)  Nutrition Assessment for the past 24 hrs:   Diet Type Feeding Percent Meals Eaten (%) Appetite   02/11/20 1700 Regular Able to feed self 30 % Poor   02/11/20 2016 Regular Able to feed self 50 % --

## 2020-02-13 LAB — RENAL FUNCTION PANEL W/EGFR
Albumin: 3.7 g/dL (ref 3.5–5.7)
Anion Gap: 8 mmol/L (ref 3–16)
BUN: 8 mg/dL (ref 7–25)
CO2: 24 mmol/L (ref 21–33)
Calcium: 9.2 mg/dL (ref 8.6–10.3)
Chloride: 103 mmol/L (ref 98–110)
Creatinine: 0.87 mg/dL (ref 0.60–1.30)
Glucose: 99 mg/dL (ref 70–100)
Osmolality, Calculated: 278 mOsm/kg (ref 278–305)
Phosphorus: 4.3 mg/dL (ref 2.1–4.7)
Potassium: 4.2 mmol/L (ref 3.5–5.3)
Sodium: 135 mmol/L (ref 133–146)
eGFR AA CKD-EPI: >90 See note.
eGFR NONAA CKD-EPI: >90 See note.

## 2020-02-13 MED ORDER — nicotine (polacrilex) (NICORETTE) gum 2 mg
2 | BUCCAL | Status: AC | PRN
Start: 2020-02-13 — End: 2020-02-15
  Administered 2020-02-13 – 2020-02-15 (×8): 2 mg via ORAL

## 2020-02-13 MED ORDER — nicotine (NICODERM CQ) 14 mg/24 hr 1 patch
14 | Freq: Every day | TRANSDERMAL | Status: AC
Start: 2020-02-13 — End: 2020-02-15
  Administered 2020-02-13 – 2020-02-15 (×3): 1 via TRANSDERMAL

## 2020-02-13 MED FILL — LORAZEPAM 2 MG/ML INJECTION SOLUTION: 2 2 mg/mL | INTRAMUSCULAR | Qty: 1

## 2020-02-13 MED FILL — SULFAMETHOXAZOLE 800 MG-TRIMETHOPRIM 160 MG TABLET: 800-160 800-160 mg | ORAL | Qty: 2

## 2020-02-13 MED FILL — NICOTINE (POLACRILEX) 2 MG GUM: 2 2 mg | BUCCAL | Qty: 1

## 2020-02-13 MED FILL — LORAZEPAM 2 MG/ML INJECTION SOLUTION: 2 2 mg/mL | INTRAMUSCULAR | Qty: 2

## 2020-02-13 MED FILL — DIAZEPAM 5 MG TABLET: 5 5 MG | ORAL | Qty: 2

## 2020-02-13 MED FILL — ONDANSETRON HCL (PF) 4 MG/2 ML INJECTION SOLUTION: 4 4 mg/2 mL | INTRAMUSCULAR | Qty: 4

## 2020-02-13 MED FILL — CEPHALEXIN 500 MG CAPSULE: 500 500 MG | ORAL | Qty: 1

## 2020-02-13 MED FILL — NICOTINE 14 MG/24 HR DAILY TRANSDERMAL PATCH: 14 14 mg/24 hr | TRANSDERMAL | Qty: 1

## 2020-02-13 MED FILL — HYDROXYZINE HCL 50 MG TABLET: 50 50 MG | ORAL | Qty: 1

## 2020-02-13 MED FILL — DIAZEPAM 5 MG/ML INJECTION SYRINGE: 5 5 mg/mL | INTRAMUSCULAR | Qty: 2

## 2020-02-13 MED FILL — METHADONE 5 MG/5 ML ORAL SOLUTION: 5 5 mg/5 mL | ORAL | Qty: 34

## 2020-02-13 MED FILL — LOPERAMIDE 2 MG CAPSULE: 2 2 mg | ORAL | Qty: 1

## 2020-02-13 MED FILL — IBUPROFEN 400 MG TABLET: 400 400 MG | ORAL | Qty: 2

## 2020-02-13 MED FILL — TRIUMEQ 600 MG-50 MG-300 MG TABLET: 600-50-300 600-50-300 mg | ORAL | Qty: 1

## 2020-02-13 MED FILL — ENOXAPARIN 40 MG/0.4 ML SUBCUTANEOUS SYRINGE: 40 40 mg/0.4 mL | SUBCUTANEOUS | Qty: 0.4

## 2020-02-13 NOTE — Unmapped (Addendum)
Nursing Overnight Progress Note    Significant Events During Shift  No incidents during the shift. Patient continued to have elevated CIWA scores throughout the shift. Prn Ativan   given as directed.    Patient/Family Concerns  Evening/Overnight Visitors: none  Concerns: none    Assessment  Nursing time demands: low  IV access: has IV access, adequate and functioning  Sitter requirements:  no    Mental Status  No data found.    Calls to Physician Team  No data found.    Medications  [phone] - Medications Not Given  (last 12 hrs)         ** No medications to display **          Diet/Meals Consumed (past 24 hours)  Nutrition Assessment for the past 24 hrs:   Diet Type Feeding Percent Meals Eaten (%) Appetite   02/12/20 1000 Regular Able to feed self 25 % Poor   02/12/20 1500 -- Declined meal -- --   02/12/20 2111 Regular Able to feed self -- Other (Comment)

## 2020-02-13 NOTE — Unmapped (Signed)
Problem: Knowledge Deficit  Goal: Patient/family/caregiver demonstrates understanding of disease process, treatment plan, medications, and discharge instructions  Description: Complete learning assessment and assess knowledge base.  Outcome: Progressing     Problem: Fall Prevention  Goal: Patient will remain free of falls  Description: Assess and monitor vitals signs, neurological status including level of consciousness and orientation.  Reassess fall risk per hospital policy.    Ensure arm band on, uncluttered walking paths in room, adequate room lighting, call light and overbed table within reach, bed in low position, wheels locked, side rails up per policy (excluding SNF), and non-skid footwear provided.   Outcome: Progressing

## 2020-02-13 NOTE — Unmapped (Signed)
[place]  Case Management/Social Work Department  Progress Note    Patient Information     Hospital day: 4  Inpatient/Observation:  Inpatient   Level of Care:    Admit date:  02/10/2020  Admission diagnosis: Alcohol withdrawal syndrome without complication (CMS Dx) [F10.230]    PMH:  has a past medical history of Anxiety, Bipolar disorder (CMS Dx), Bronchitis, Depression, Drug abuse and dependence (CMS Dx), H/O tooth extraction, Hepatitis C, Liver disease (Hep C), PTSD (post-traumatic stress disorder), Substance induced mood disorder (CMS Dx), and Thyroid disease.    PCP:  Herbert Pun, MD    Home Pharmacy:    [facility] [address], [place] - [address]  [address]  [place] [address]  Phone: [phone]     [facility]  [address] [place] [address]  Phone: [phone]     [facility] [address], [place] - [address]  [address] [place] [address]  Phone: [phone]     [facility] [address] - [place], [place] - [address]  [address] [place] [address]  Phone: [phone]     [facility] [address] [place], [place] - [address] [facility] [address]  [address]  [name] [address]  Phone: [phone]     [facility]  [address]  [address] [place] [address]  Phone: [phone]         Medical Insurance Coverage:  Payor: Luther Redo / Plan: MOLINA / Product Type: Medicaid Mngd care /     Other Pertinent Information   SW attended huddle with there medical team. Pt continues to have diarrhea but appears less shaky today per nursing staff.  [facility].  SW contacted [facility] [phone]) and left 2 VMs for [facility]. But no call back. Patient screened there on 3/1.       Discharge Plan     Anticipated discharge plan[facility]  [facility]     Anticipated discharge date:  1-3 days     CM/SW will continue to follow and remain available for discharge planning needs.       Mee Hives LSW,MSW  [phone]

## 2020-02-13 NOTE — Unmapped (Signed)
Hospital Medicine  Daily Progress Note      Chief Complaint / Reason for Follow-Up     Alexander [name] is a 34 y.o. male on hospital day 3.  The principal reason for today's follow up visit is Alcohol withdrawal (CMS Dx).      Interval History     No acute events overnight. Per nursing notes, tachycardia to 150's after visit of a friend, medication given and improvement of symptoms. CIWA remains elevated ranging between 19 - 24. He denies receiving anything from the friend. During visit, he states trying to get better. Complains of diarrhea last night, but no BMs this am yet. Sw following on rehab place.     Review of Systems (focused)     Review of Systems   Constitutional: Negative for chills and fever.   Respiratory: Negative for cough and shortness of breath.    Cardiovascular: Negative for chest pain and palpitations.   Gastrointestinal: Positive for diarrhea and nausea. Negative for abdominal pain.   Musculoskeletal: Negative for myalgias.   Neurological: Positive for tremors and headaches.       Medications     Scheduled Meds:  ??? abacavir-dolutegravir-lamivud  1 tablet Oral Daily 0900   ??? cephALEXin  500 mg Oral 4x Daily AC & HS   ??? diazePAM  10 mg Oral TID    Or   ??? diazePAM  10 mg Intravenous TID   ??? enoxaparin  40 mg Subcutaneous Daily 0900   ??? flu vaccine (seasonal Age 65 or LESS)(PF) IM  0.5 mL Intramuscular Once tomorrow morning   ??? methadone  34 mg Oral Daily 0900   ??? sodium chloride  10 mL Intravenous QS   ??? sulfamethoxazole-trimethoprim  2 tablet Oral BID   ??? thiamine  100 mg Oral Daily 0900     Continuous Infusions:  PRN Meds:acetaminophen, cloNIDine HCL, hydrOXYzine HCL, ibuprofen, loperamide, LORazepam **OR** LORazepam, ondansetron      Vital Signs     Temp:  [97.3 ??F (36.3 ??C)-98.7 ??F (37.1 ??C)] 97.8 ??F (36.6 ??C)  Heart Rate:  [55-89] 61  Resp:  [14-20] 20  BP: (93-146)/(53-88) 93/53    Intake/Output Summary (Last 24 hours) at 02/13/2020 0738  Last data filed at 02/13/2020 0432  Gross per 24 hour    Intake 620 ml   Output 600 ml   Net 20 ml         Physical Exam     Gen - Alert, no acute distress.   Eyes - Normal conjunctiva, normal pupils, anicteric sclera  ENT - Moist mucosa, no OP erythema or exudates  Neck - Supple, no thyromegaly  Lymph - No adenopathy cervical or supraclavicular  CV - RRR, no MRG, no elevated JVP, no peripheral edema  Lung - Clear to auscultation anteriorly and posteriorly, normal work of breathing. On room air.   Abd - Positive bowel sounds, soft, mild tenderness to palpation, non distended, no organomegaly  MSK - No clubbing, no cyanosis. Mild erythema over bilateral forearms without fluctuance, edema or raised areas. Full ROM of hands/wrists bilaterally. Track marks along bilateral arms. 1cm nodule on R forearm, tender to palpation.   Skin - Warm and dry, no rashes  Neuro - Alert, oriented to self/place/time/situation, no facial asymmetry, no gross focal weakness, sensation grossly intact to light touch. Mild tremors.   Psych - Anxious, pleasant    Laboratory Data         Lab 02/10/20  1558   WBC 5.4  HEMOGLOBIN 14.0   HEMATOCRIT 42.1   MEAN CORPUSCULAR VOLUME 88.3   PLATELETS 199           Lab 02/13/20  0549 02/10/20  1558   SODIUM 135 136   POTASSIUM 4.2 4.2   CHLORIDE 103 101   CO2 24 24   BUN 8 4*   CREATININE 0.87 0.65   GLUCOSE 99 95           Lab 02/13/20  0549 02/10/20  1558   CALCIUM 9.2 9.5   MAGNESIUM  --  2.2   PHOSPHORUS 4.3 3.3                 Lab 02/13/20  0549   ALBUMIN 3.7             Invalid input(s): WBCCAST, GRANCAST          No results found for: NTPROBNP    Lab Results   Component Value Date    TSH 1.21 10/17/2017    FREET4 1.08 11/14/2013                   Diagnostic Studies     No results found.     The images associated with the above reports were personally reviewed.      Assessment & Plan     Alexander [name] is a 34 y.o. male on hospital day 3.  The medical issues being addressed in today's encounter are as follows:    Principal Problem:    Alcohol  withdrawal (CMS Dx)    # Alcohol withdrawal  # Nausea  # Tachycardia  Presented to the ED for alcohol withdrawal. Patient with a history of 12 beers/day history for the past 10 years. LAst drink was hours prior to arrival in the ED. History of seizures with alcohol withdrawal in the past, per patient.   - Continue CIWA protocol. Scores ranging 17-20.   - Continue CMU to monitor possible seizures  - Valium scheduled, ativan prn and rally pack.   - SW following for alcohol rehab placement ([facility])  - Continue zofran 8mg  q8h prn  - Continue hydroxyzine 50mg  q6hs prn for anxiety   - Continue Ibuprofen 800mg  q8hs prn for muscle pain.   - Elevated HR to 150's on 03/10. Improved with a dose of clonidine.     # Opiate Addiction   History of IV drug use, fentanyl and heroin.  PTA: Methadone 34mg  daily. Pt reports increased to 38mg  daily.   - Continue home dose. Confirmed with Methadone clinic the correct amount dose. He was about to increase his daily dose to 38mg .   - Addiction medicine consult: appreciate recommendations.   ?? Continue methadone 34mg  daily  ?? If necessary add clonidine, zofran, loperamide, hydroxyzine and ibuprofen prn    # Diarrhea   Complains of diarrhea for about 3 days. Likely related to opioid withdrawal.   - Continue loperamide 2mg  q2hs prn     # Cellulitis: Improving   Reports erythema on bilateral forearm for about a week. Was seen in the ED on 03/07 for this problem ans was started on antibiotics.   - Continue Bactrim and keflex (end date 03/13)    # HIV  PTA: Triumeq  HIV on HAART tx (2/21 viral load undetectable, CD4 471). Not diagnosed with AIDS. Follows Dr. Merilynn Finland.   - Continue home meds       DVT ppx: Lovenox     Dispo: Pending  improvement of withdrawals. Will return home or rehab.     Diet:   Diet Orders          Diet regular starting at 03/08 1956        Code Status: Full Code     No future appointments.    A portion of this note was copied forward.  I have reviewed  and updated the history, physical exam, data, assessment, and plan of the note so that it reflects the evaluation and management of the patient today.       Casilda Carls, CNP  Department of Internal Medicine  [place] [phone])  7:38 AM, 02/13/2020

## 2020-02-14 MED ORDER — methadone (DOLOPHINE) 5 mg/5 mL oral solution 38 mg
5 | Freq: Every day | ORAL | Status: AC
Start: 2020-02-14 — End: 2020-02-15
  Administered 2020-02-15: 15:00:00 38 mg via ORAL

## 2020-02-14 MED FILL — METHADONE 5 MG/5 ML ORAL SOLUTION: 5 5 mg/5 mL | ORAL | Qty: 38

## 2020-02-14 MED FILL — TRIUMEQ 600 MG-50 MG-300 MG TABLET: 600-50-300 600-50-300 mg | ORAL | Qty: 1

## 2020-02-14 MED FILL — IBUPROFEN 400 MG TABLET: 400 400 MG | ORAL | Qty: 2

## 2020-02-14 MED FILL — DIAZEPAM 5 MG/ML INJECTION SYRINGE: 5 5 mg/mL | INTRAMUSCULAR | Qty: 2

## 2020-02-14 MED FILL — NICOTINE (POLACRILEX) 2 MG GUM: 2 2 mg | BUCCAL | Qty: 1

## 2020-02-14 MED FILL — DIAZEPAM 5 MG TABLET: 5 5 MG | ORAL | Qty: 2

## 2020-02-14 MED FILL — SULFAMETHOXAZOLE 800 MG-TRIMETHOPRIM 160 MG TABLET: 800-160 800-160 mg | ORAL | Qty: 2

## 2020-02-14 MED FILL — CEPHALEXIN 500 MG CAPSULE: 500 500 MG | ORAL | Qty: 1

## 2020-02-14 MED FILL — NICOTINE 14 MG/24 HR DAILY TRANSDERMAL PATCH: 14 14 mg/24 hr | TRANSDERMAL | Qty: 1

## 2020-02-14 MED FILL — LOPERAMIDE 2 MG CAPSULE: 2 2 mg | ORAL | Qty: 1

## 2020-02-14 MED FILL — LORAZEPAM 2 MG/ML INJECTION SOLUTION: 2 2 mg/mL | INTRAMUSCULAR | Qty: 2

## 2020-02-14 MED FILL — HYDROXYZINE HCL 50 MG TABLET: 50 50 MG | ORAL | Qty: 1

## 2020-02-14 MED FILL — LORAZEPAM 2 MG/ML INJECTION SOLUTION: 2 2 mg/mL | INTRAMUSCULAR | Qty: 1

## 2020-02-14 MED FILL — CLONIDINE HCL 0.1 MG TABLET: 0.1 0.1 MG | ORAL | Qty: 1

## 2020-02-14 MED FILL — ENOXAPARIN 40 MG/0.4 ML SUBCUTANEOUS SYRINGE: 40 40 mg/0.4 mL | SUBCUTANEOUS | Qty: 0.4

## 2020-02-14 NOTE — Unmapped (Signed)
Nursing Day Shift Progress Note    Significant Events During Shift  Pt found with changed disposition and physical state with a few minutes at 1610.  Pt reported a friend is coming to visit. Pt informed to tell them not to bring anything to room.  Pt stated that was told to visitor.  Pt went in hallway looking for friend. Pt reminded by this nurse and escorted back to room due to tachycardia with ambulation. Pt pleasant & cooperative although appeared less alert and purposeful in movements.  Pt & friend walked to room.  Pt's face red, beads of sweat on forehead, euphoric, restless, increased tremor and decrease purposeful bodily movements as pt is lying on bed awkwardly, twisting torso around fumbling with items in room. Jacket and clothing found on floor.  Pt admitted to not feeling very good. Pt eyes lacked focus and alertness. NP contacted to assess pt's change in condition.  HR SR on monitor while pt sits in room on bed. Pt diaphoretic. Pt VSS, respirations easy on RA.   Pt admitted to sniffing fentanyl from a bag and throwing bag in garbage, denied friend bringing anything.   Pt appeared to be influenced by a substance a few minutes prior to friends arrival.   Pt's belongings checked and small clean .5ml syring fell from pt's coat. NP present during episode.   Pt informed & reminded of consequences by this nurse.  Support given and pt encouraged to comply.   Ongoing active listening and monitoring of pt's status and needs. CMU monitoring Spo2 & cardiac post this event. SR and 99 % 02 on RA.  Vss continued.    Pt's belongings taken to nurses station.      Patient/Family Concerns  Visitors: Friend, Taner (sp?)  Concerns: Rehab plans and acceptance programs previously set up and discharge readiness.    Assessment  Nursing time demands: moderate to low  IV access: has IV access, adequate and functioning  Sitter requirements:  no    Mental Status  No data found.    Medications  [phone] - Medications Not Given   (last 12 hrs)         ** No medications to display **

## 2020-02-14 NOTE — Unmapped (Signed)
[facility]  Case Management/Social Work Department  Progress Note    Patient Information     Hospital day: 4  Inpatient/Observation:  Inpatient   Level of Care:    Admit date:  02/10/2020  Admission diagnosis: Alcohol withdrawal syndrome without complication (CMS Dx) [F10.230]    PMH:  has a past medical history of Anxiety, Bipolar disorder (CMS Dx), Bronchitis, Depression, Drug abuse and dependence (CMS Dx), H/O tooth extraction, Hepatitis C, Liver disease (Hep C), PTSD (post-traumatic stress disorder), Substance induced mood disorder (CMS Dx), and Thyroid disease.    PCP:  Herbert Pun, MD    Home Pharmacy:    [facility] [address], [place] - [address]  [address]  [place] [address]  Phone: [phone]     [facility]  [address] [place] [address]  Phone: [phone]     [facility] [address] [place] - [address]  [address] [place] [address]  Phone: [phone]     [facility] [address] - [place], [place] - [address]  [address] [place] [address]  Phone: [phone]     [facility] [address] [place], [place] - [address] [facility] [address] [facility] [address]  [address]  [place] [address]  Phone: [phone]     [facility]  [address]  [address] [address]  Phone: [phone]         Medical Insurance Coverage:  Payor: Luther Redo / Plan: MOLINA / Product Type: Medicaid [place] care /     Other Pertinent Information   SW called [facility] intake [phone] and talked with Richrd Sox. She reported they can accept pt but he needs to be fully detoxed, has to have an updated COVID test and Methadone dose not be higher than 60 mg. Richrd Sox reported they do not accept patients over the week end.  SW attended huddle with the medical team. Per nursing staff pt appears very anxious and pt was given a dose of Klonodine.  Pt is expected to be medically ready to transfer to [facility] on Monday or Tuesday.    Update:  Pt  had a visitor around 4.00pm and suddenly pt's behavior changed. Pt admitted to snorting a small amount of Fentanyl  Brought over by his friend. Pt's belongings were rem,oved from the room and searched. Nursing found a syringe in pt's jacket.     SW met pt at bed side and updated pt can go to [facility] possibly on Monday for rehab. Pt reported they don't care as long as he does not drink. Pt was a little argumentative with SW saying [facility] will not be concerned about pt snorting Fentanyl as long as it not alcohol.  If not pt reported Blythe Stanford will come get me today.  SW agreed to meet with pt on Monday morning.      Discharge Plan     Anticipated discharge plan:  Transfer to [facility] for rehab.     Anticipated discharge date:  02/18/20     CM/SW will continue to follow and remain available for discharge planning needs.      Mee Hives LSW,MSW  [phone]

## 2020-02-14 NOTE — Unmapped (Signed)
Hospital Medicine  Daily Progress Note      Chief Complaint / Reason for Follow-Up     Alexander [name] is a 34 y.o. male on hospital day 4.  The principal reason for today's follow up visit is Alcohol withdrawal (CMS Dx).      Interval History     No acute events overnight. During visit, he is still very tremulous. Complains of diarrhea and mild nausea. SW following to place him on [facility]. CMU reviewed and this am he had a few episodes of tachycardia up to 150s, complaining of mild palpitations.     Review of Systems (focused)     Review of Systems   Constitutional: Negative for chills and fever.   Respiratory: Negative for cough and shortness of breath.    Cardiovascular: Negative for chest pain and palpitations.   Gastrointestinal: Positive for diarrhea and nausea. Negative for abdominal pain.   Musculoskeletal: Negative for myalgias.   Neurological: Positive for tremors and headaches.       Medications     Scheduled Meds:  ??? abacavir-dolutegravir-lamivud  1 tablet Oral Daily 0900   ??? cephALEXin  500 mg Oral 4x Daily AC & HS   ??? diazePAM  10 mg Oral TID    Or   ??? diazePAM  10 mg Intravenous TID   ??? enoxaparin  40 mg Subcutaneous Daily 0900   ??? methadone  34 mg Oral Daily 0900   ??? nicotine  1 patch Transdermal Daily 0900   ??? sodium chloride  10 mL Intravenous QS   ??? sulfamethoxazole-trimethoprim  2 tablet Oral BID     Continuous Infusions:  PRN Meds:acetaminophen, cloNIDine HCL, hydrOXYzine HCL, ibuprofen, loperamide, LORazepam **OR** LORazepam, nicotine (polacrilex), ondansetron      Vital Signs     Temp:  [97.3 ??F (36.3 ??C)-98 ??F (36.7 ??C)] 97.3 ??F (36.3 ??C)  Heart Rate:  [60-97] 66  Resp:  [18-20] 18  BP: (98-131)/(56-84) 115/67    Intake/Output Summary (Last 24 hours) at 02/14/2020 0743  Last data filed at 02/14/2020 0347  Gross per 24 hour   Intake 1350 ml   Output --   Net 1350 ml         Physical Exam     Gen - Alert, no acute distress.   Eyes - Normal conjunctiva, normal pupils, anicteric sclera  ENT -  Moist mucosa, no OP erythema or exudates  Neck - Supple, no thyromegaly  Lymph - No adenopathy cervical or supraclavicular  CV - RRR, no MRG, no elevated JVP, no peripheral edema  Lung - Clear to auscultation anteriorly and posteriorly, normal work of breathing. On room air.   Abd - Positive bowel sounds, soft, mild tenderness to palpation, non distended, no organomegaly  MSK - No clubbing, no cyanosis. Mild erythema over bilateral forearms without fluctuance, edema or raised areas. Full ROM of hands/wrists bilaterally. Track marks along bilateral arms. 1cm nodule on R forearm, tender to palpation.   Skin - Warm and dry, no rashes  Neuro - Alert, oriented to self/place/time/situation, no facial asymmetry, no gross focal weakness, sensation grossly intact to light touch. Mild tremors.   Psych - Anxious, pleasant    Laboratory Data         Lab 02/10/20  1558   WBC 5.4   HEMOGLOBIN 14.0   HEMATOCRIT 42.1   MEAN CORPUSCULAR VOLUME 88.3   PLATELETS 199           Lab 02/13/20  0549 02/10/20  1558   SODIUM 135 136   POTASSIUM 4.2 4.2   CHLORIDE 103 101   CO2 24 24   BUN 8 4*   CREATININE 0.87 0.65   GLUCOSE 99 95           Lab 02/13/20  0549 02/10/20  1558   CALCIUM 9.2 9.5   MAGNESIUM  --  2.2   PHOSPHORUS 4.3 3.3                 Lab 02/13/20  0549   ALBUMIN 3.7             Invalid input(s): WBCCAST, GRANCAST          No results found for: NTPROBNP    Lab Results   Component Value Date    TSH 1.21 10/17/2017    FREET4 1.08 11/14/2013                   Diagnostic Studies     No results found.     The images associated with the above reports were personally reviewed.      Assessment & Plan     Alexander [name] is a 34 y.o. male on hospital day 4.  The medical issues being addressed in today's encounter are as follows:    Principal Problem:    Alcohol withdrawal (CMS Dx)    # Alcohol withdrawal  # Nausea  # Tachycardia  Presented to the ED for alcohol withdrawal. Patient with a history of 12 beers/day history for the past 10  years. LAst drink was hours prior to arrival in the ED. History of seizures with alcohol withdrawal in the past, per patient.   - Continue CIWA protocol. Scores ranging 17-20.   - Continue CMU to monitor possible seizures  - Valium scheduled, ativan prn and rally pack.   - SW following for alcohol rehab placement ([facility])  - Continue zofran 8mg  q8h prn  - Continue hydroxyzine 50mg  q6hs prn for anxiety   - Continue Ibuprofen 800mg  q8hs prn for muscle pain.   - Elevated HR to 150's on 03/10 and 03/12. Improved with a dose of clonidine.   - He reports not eating much but drinking about 3 pitchers of water a day.     # Opiate Addiction   History of IV drug use, fentanyl and heroin.  PTA: Methadone 34mg  daily. Pt reports increased to 38mg  daily.   - Continue home dose. Confirmed with Methadone clinic the correct amount dose. He was about to increase his daily dose to 38mg .   - Addiction medicine consult: appreciate recommendations.   ?? Increased methadone from 34mg  to 38mg  daily, per addictions recommendations.    ?? If necessary add clonidine, zofran, loperamide, hydroxyzine and ibuprofen prn    # Diarrhea   Complains of diarrhea for about 3 days. Likely related to opioid withdrawal.   - Continue loperamide 2mg  q2hs prn     # Cellulitis: Improving   Reports erythema on bilateral forearm for about a week. Was seen in the ED on 03/07 for this problem ans was started on antibiotics.   - Continue Bactrim and keflex (end date 03/13)    # HIV  PTA: Triumeq  HIV on HAART tx (2/21 viral load undetectable, CD4 471). Not diagnosed with AIDS. Follows Dr. Merilynn Finland.   - Continue home meds       DVT ppx: Lovenox     Dispo: Pending improvement of withdrawals. Will be  discharge to [facility] for rehab. SW following.      Diet:   Diet Orders          Diet regular starting at 03/08 1956        Code Status: Full Code     No future appointments.    A portion of this note was copied forward.  I have reviewed and updated  the history, physical exam, data, assessment, and plan of the note so that it reflects the evaluation and management of the patient today.       Casilda Carls, CNP  Department of Internal Medicine  [place]ager [place] [phone])  7:43 AM, 02/14/2020

## 2020-02-14 NOTE — Unmapped (Signed)
Problem: Fall Prevention  Goal: Patient will remain free of falls  Description: Assess and monitor vitals signs, neurological status including level of consciousness and orientation.  Reassess fall risk per hospital policy.    Ensure arm band on, uncluttered walking paths in room, adequate room lighting, call light and overbed table within reach, bed in low position, wheels locked, side rails up per policy (excluding SNF), and non-skid footwear provided.   Outcome: Progressing

## 2020-02-14 NOTE — Unmapped (Signed)
Brief Medicine Progress Note       This afternoon, the patient received a visitor form a friend around 1600. The bedside nurse called the provider, due to the concern of change in behavior. I examined the pt, VS stable, he was lethargic, pinpoint pupils. He did report snorting a small amount of fentanyl he found on his jacket. He is hemodynamically stable, on CMU, HR 89, respirations 12 and SPO2 98% RA. He denies any symptoms. I explained that our policy will not permit any more visitors after this. All his belongings removed from the room and searched by nursing staff. An empty syringe was found on his jacket. He states he didn't know he had a syringe there. Nursing staff aware to keep a close eye on him for potential overdose. The friend was asked to leave. I called Dr Elmarie Shiley to be aware of the situation.         Casilda Carls, CNP  Department of Internal Medicine  [place] [phone])  4:37 PM, 02/14/2020

## 2020-02-15 MED ORDER — sulfamethoxazoletrimethoprimBACTRIMDS800160mgpertablet
800-160 | ORAL_TABLET | Freq: Two times a day (BID) | ORAL | 0 refills | Status: AC
Start: 2020-02-15 — End: 2020-02-16

## 2020-02-15 MED ORDER — cephALEXin (KEFLEX) 500 MG capsule
500 | ORAL_CAPSULE | Freq: Four times a day (QID) | ORAL | 0 refills | Status: AC
Start: 2020-02-15 — End: 2020-02-16

## 2020-02-15 MED FILL — HYDROXYZINE HCL 50 MG TABLET: 50 50 MG | ORAL | Qty: 1

## 2020-02-15 MED FILL — ENOXAPARIN 40 MG/0.4 ML SUBCUTANEOUS SYRINGE: 40 40 mg/0.4 mL | SUBCUTANEOUS | Qty: 0.4

## 2020-02-15 MED FILL — CEPHALEXIN 500 MG CAPSULE: 500 500 MG | ORAL | Qty: 1

## 2020-02-15 MED FILL — NICOTINE (POLACRILEX) 2 MG GUM: 2 2 mg | BUCCAL | Qty: 1

## 2020-02-15 MED FILL — SULFAMETHOXAZOLE 800 MG-TRIMETHOPRIM 160 MG TABLET: 800-160 800-160 mg | ORAL | Qty: 2

## 2020-02-15 MED FILL — DIAZEPAM 5 MG TABLET: 5 5 MG | ORAL | Qty: 2

## 2020-02-15 MED FILL — LORAZEPAM 2 MG/ML INJECTION SOLUTION: 2 2 mg/mL | INTRAMUSCULAR | Qty: 1

## 2020-02-15 MED FILL — CLONIDINE HCL 0.1 MG TABLET: 0.1 0.1 MG | ORAL | Qty: 1

## 2020-02-15 MED FILL — IBUPROFEN 400 MG TABLET: 400 400 MG | ORAL | Qty: 2

## 2020-02-15 MED FILL — DIAZEPAM 5 MG/ML INJECTION SYRINGE: 5 5 mg/mL | INTRAMUSCULAR | Qty: 2

## 2020-02-15 MED FILL — LORAZEPAM 2 MG/ML INJECTION SOLUTION: 2 2 mg/mL | INTRAMUSCULAR | Qty: 2

## 2020-02-15 MED FILL — NICOTINE 14 MG/24 HR DAILY TRANSDERMAL PATCH: 14 14 mg/24 hr | TRANSDERMAL | Qty: 1

## 2020-02-15 NOTE — Nursing Note (Signed)
Nursing Overnight Progress Note    Significant Events During Shift  CIWA assessment still being completed and Lorazepam given accordingly, see MAR. Patien states that he wants to get better. Will continue to encourage counseling.     Patient/Family Concerns  Evening/Overnight Visitors: none  Concerns: none    Assessment  Nursing time demands: moderate  IV access: has IV access, adequate and functioning  Sitter requirements:  no    Mental Status  No data found.    Calls to Physician Team  No data found.    Medications  [phone] - Medications Not Given  (last 12 hrs)         ** No medications to display **          Diet/Meals Consumed (past 24 hours)  Nutrition Assessment for the past 24 hrs:   Diet Type Feeding Percent Meals Eaten (%) Appetite   02/14/20 1000 Regular Able to feed self 100 % Good   02/14/20 1430 -- -- 75 % Good   02/14/20 1830 -- -- 50 % Fair

## 2020-02-15 NOTE — Unmapped (Signed)
Alcohol Withdrawal  Alcohol withdrawal is a group of symptoms that can develop when a person who drinks heavily and regularly stops drinking or drinks less.  What are the causes?  Heavy and regular drinking can cause chemicals that send signals from the brain to the body (neurotransmitters) to deactivate. Alcohol withdrawal develops when deactivated neurotransmitters reactivate because a person stops drinking or drinks less.  What increases the risk?  The more a person drinks and the longer he or she drinks, the greater the risk of alcohol withdrawal. Severe withdrawal is more likely to develop in someone who:  ?? Had severe alcohol withdrawal in the past.  ?? Had a seizure during a previous episode of alcohol withdrawal.  ?? Is elderly.  ?? Is pregnant.  ?? Has been abusing drugs.  ?? Has other medical problems, including:  ?? Infection.  ?? Heart, lung, or liver disease.  ?? Seizures.  ?? Mental health problems.  What are the signs or symptoms?  Symptoms of this condition can be mild to moderate, or they can be severe.  Mild to moderate symptoms may include:  ?? Fatigue.  ?? Nightmares.  ?? Trouble sleeping.  ?? Depression.  ?? Anxiety.  ?? Inability to think clearly.  ?? Mood swings.  ?? Irritability.  ?? Loss of appetite.  ?? Nausea or vomiting.  ?? Clammy skin.  ?? Extreme sweating.  ?? Rapid heartbeat.  ?? Shakiness.  ?? Uncontrollable shaking (tremor).  Severe symptoms may include:  ?? Fever.  ?? Seizures.  ?? Severe??confusion.  ?? Feeling or seeing things that are not there (hallucinations).  Symptoms usually begin within eight hours after a person stops drinking or drinks less. They can last for weeks.  How is this diagnosed?  Alcohol withdrawal is diagnosed with a medical history and physical exam. Sometimes, urine and blood tests are also done.  How is this treated?  Treatment may involve:  ?? Monitoring blood pressure, pulse, and breathing.  ?? Getting fluids through an IV tube.  ?? Medicine to reduce anxiety.  ?? Medicine to prevent or  control seizures.  ?? Multivitamins and B vitamins.  ?? Having a health care provider check on you daily.  If symptoms are moderate to severe or if there is a risk of severe withdrawal, treatment may be done at a hospital or treatment center.  Follow these instructions at home:  ?? Take medicines and vitamin supplements only as directed by your health care provider.  ?? Do not drink alcohol.  ?? Have someone stay with you or be available if you need help.  ?? Drink enough fluid to keep your urine clear or pale yellow.  ?? Consider joining a 12-step program or another alcohol support group.  Contact a health care provider if:  ?? Your symptoms get worse or do not go away.  ?? You cannot keep food or water in your stomach.  ?? You are struggling with not drinking alcohol.  ?? You cannot stop drinking alcohol.  Get help right away if:  ?? You have an irregular heartbeat.  ?? You have chest pain.  ?? You have trouble breathing.  ?? You have symptoms of severe withdrawal, such as:  ?? A fever.  ?? Seizures.  ?? Severe confusion.  ?? Hallucinations.  This information is not intended to replace advice given to you by your health care provider. Make sure you discuss any questions you have with your health care provider.  Document Released: 08/31/2005 Document Revised: 03/30/2016 Document   Reviewed: 09/09/2014  Elsevier Interactive Patient Education ?? 2018 [employer]      Chemical Dependency  Chemical dependency is an addiction to drugs or alcohol. People with this addiction repeatedly seek out and use drugs or alcohol despite negative consequences to the health and safety of themselves and others.  Addiction changes the way the brain works. Because of these changes, addiction is a chronic condition. The medical term for addiction or chemical dependency is substance use disorder. The disorder can be mild, moderate, or severe.  People can be dependent on a range of substances. These include alcohol, prescription medicines, and illegal or  street drugs, such as marijuana, heroin, and cocaine.  What are the causes?  This condition is caused by the effect that the abused substance has on the brain.  What increases the risk?  The following factors may make you more likely to develop this condition:  ?? Having??a family history of chemical dependency.  ?? Having mental health issues, such as depression, anxiety, or bipolar disorder.  ?? Living in an environment where drugs and alcohol are easily available.  ?? Using drugs or alcohol at a young age.  ?? Having friends who use drugs or alcohol.  ?? Having poor social skills.  ?? Tending to be aggressive or impulsive.  What are the signs or symptoms?  Symptoms may vary depending on the substance that you are addicted to. Symptoms may include the following:  Physical Symptoms  ?? The inability to limit the use of drugs or alcohol.  ?? Having nausea, sweating, shakiness, and anxiety when you are not using alcohol or drugs.  ?? Needing a greater amount of drugs or alcohol to get the same effect (developing tolerance).  ?? A change in:  ?? Sleeping habits.  ?? Eating or appetite.  ?? Appearance or how you care for yourself.  Emotional Symptoms  ?? Angry outbursts.  ?? Periods of sadness and tearfulness.  ?? Isolation.  Relationship Problems  ?? Loved ones suggesting that you have a problem.  ?? Increased fights.  ?? Forgetting commitments.  ?? Having affairs or one-night stands.  Problems Related to Irresponsibility  ?? Legal problems.  ?? Irresponsibility with money.  ?? Missing work.  ?? Poor decision making.  How is this diagnosed?  This condition may be diagnosed based on your symptoms, your medical history, and a physical exam. You may also have blood tests and urine tests.  How is this treated?  Treatment for this condition depends on the substance that you are addicted to and whether your dependency is mild, moderate, or severe. Treatment options may include:  ?? Stopping substance use safely. This may require taking medicines and  being closely observed for several days.  ?? Taking part in group and individual counseling with mental health providers who help people with chemical dependency.  ?? Staying at a residential treatment center for several days or weeks.  ?? Attending daily counseling sessions at a treatment center.  ?? Taking medicine as told by your health care provider to:  ?? Ease symptoms and prevent complications during withdrawal.  ?? Treat other mental health issues, such as depression or anxiety.  ?? Block cravings by causing the same effects as the substance.  ?? Block the effects of the substance or replace good sensations with unpleasant ones.  ?? Going to a support group to share your experience with others who are going through the same thing. These groups are an important part of long-term recovery for  many people. They include 12-step programs like Alcoholics Anonymous and Narcotics Anonymous.  Recovery can be a long process. Many people who undergo treatment start using the substance again after stopping. This is called a relapse. If you have a relapse, it does not mean that treatment will not work.  Follow these instructions at home:  ?? Avoid temptations or triggers that you associate with your use of the substance.  ?? Learn and practice techniques for managing stress.  ?? Have a plan for vulnerable moments.  ?? Get phone numbers of those who are willing to help and who are committed to your recovery.  ?? Know when and where the meetings that you have chosen will occur.  ?? Take over-the-counter and prescription medicines only as told by your health care provider.  ?? Keep all follow-up visits as told by your health care provider. This is important.  Contact a health care provider if:  ?? You cannot take your medicines as told.  ?? Your symptoms get worse.  ?? You have trouble resisting the urge to use drugs or alcohol.  ?? You are in pain, shaking, sweating, or feeling generally unwell.  ?? You are losing weight without trying  to.  Get help right away if:  ?? You lose consciousness.  ?? Your breathing is slow.  ?? Your pulse is slow or jumpy.  ?? You have serious thoughts about hurting yourself or someone else.  ?? You have a relapse.  This information is not intended to replace advice given to you by your health care provider. Make sure you discuss any questions you have with your health care provider.  Document Released: 11/15/2001 Document Revised: 12/28/2015 Document Reviewed: 07/29/2015  Elsevier Interactive Patient Education ?? 2018 [employer].

## 2020-02-15 NOTE — Unmapped (Signed)
Assess and monitor vitals signs, neurological status including level of consciousness and orientation.  Reassess fall risk per hospital policy.    Ensure arm band on, uncluttered walking paths in room, adequate room lighting, call light and overbed table within reach, bed in low position, wheels locked,side rails up per policy and non-skid footwear provided.

## 2020-02-15 NOTE — Unmapped (Signed)
[facility]  Department of Internal Medicine  Inpatient Discharge Summary    Patient: Alexander [name]   MRN: [id number]   CSN: [id number]    Date of Admission: 02/10/2020  Date of Discharge: 02/15/2020  Attending Physician: [name], MD     Diagnoses Present on Admission     Past Medical History:   Diagnosis Date   ??? Anxiety    ??? Bipolar disorder (CMS Dx)    ??? Bronchitis    ??? Depression    ??? Drug abuse and dependence (CMS Dx)    ??? H/O tooth extraction    ??? Hepatitis C    ??? Liver disease Hep C   ??? PTSD (post-traumatic stress disorder)    ??? Substance induced mood disorder (CMS Dx)    ??? Thyroid disease       Discharge Diagnoses     Active Hospital Problems    Diagnosis Date Noted   ??? Alcohol withdrawal (CMS Dx) [F10.239] 02/01/2020      Resolved Hospital Problems   No resolved problems to display.         Operations/Procedures Performed (include dates)     Surgeries: None        Lines/Drains/Airways:  Patient Lines/Drains/Airways Status    Active Line / PIV Line     Name:   Placement date:   Placement time:   Site:   Days:    Peripheral IV 02/11/20 Posterior;Right Wrist   02/11/20    2350    Wrist   3                  Notable Imaging Studies:  No orders to display         Consulting Services (include reason)     1. Addiction science: opioid use  2. SW: disposition      Allergies     No Known Allergies      Discharge Medications        Medication List      TAKE these medications, which you were ALREADY TAKING      Quantity/Refills   cephALEXin 500 MG capsule  Commonly known as: KEFLEX  Take 1 capsule (500 mg total) by mouth 4 times daily before meals and at bedtime for 1 day. Indications: skin and skin structure infection   Quantity: 4 capsule  For: skin and skin structure infection  Refills: 0     methadone 10 mg/5 mL solution  Commonly known as: DOLOPHINE  Take 34 mg by mouth daily.   Refills: 0     sulfamethoxazole-trimethoprim 800-160 mg per tablet  Commonly known as: Bactrim  DS  Take 2 tablets by mouth 2 times a day for 1 day. Indications: cellulitis   Quantity: 4 tablet  For: cellulitis  Refills: 0     Triumeq 600-50-300 mg Tab  Generic drug: abacavir-dolutegravir-lamivud  Take 1 tablet by mouth daily.   Quantity: 30 tablet  Refills: 5           Where to Get Your Medications      You can get these medications from any pharmacy    Bring a paper prescription for each of these medications  ?? cephALEXin 500 MG capsule  ?? sulfamethoxazole-trimethoprim 800-160 mg per tablet           Reason for Admission     [name] is a 34 y.o. male with PMH of HIV, IV drug abuse, and prior  psychiatric admissions with history of SI, depression, opiate abuse, and alcohol abuse who presented to the ED today on 02/10/20 for alcohol withdrawal.      Hospital Course By Problem     # Alcohol withdrawal  Presented to the ED with alcohol withdrawal symptoms. Patient with a history of 12 beers/day history for the past 10 years. Last drink was hours prior to arrival in the ED. History of seizures with alcohol withdrawal in the past, per patient.   Continued on CIWA protocol with elevated scores ranging between 17-20. Continued on CMU to monitor possible seizures. Had few episodes of tachycardia with HR up to 150s, improved with clonidine. Received daily valium scheduled, ativan prn and rally pack. Continued on zofran, hydroxyzine, ibuprofen. SW was consulted for alcohol rehab placement and set to discharge to [facility] on Monday 3/15. He ultimately decided he was ready to discharge home despite max education regarding substance withdrawal.   ??  # Opiate Addiction   History of IV drug use, fentanyl and heroin. PTA: Methadone 34mg  daily. Initially continued on home dose Addiction medicine was consulted and recommended to increased methadone dose to 38mg  daily and if necessary add clonidine, zofran, loperamide, hydroxyzine and ibuprofen prn. During hospitalization, the pt snorted fentanyl which he found in his  jacket pocket. He was monitored closely and remained hemodynamically stable. His belongs were removed from the room and visitors were restricted for the entirety of hospitalization.   ??  # Diarrhea   Complained of diarrhea for about 3 days prior admission. Likely related to opioid withdrawal. Continued on loperamide 2mg  q2hs prn  ??  # Cellulitis  Erythema on bilateral forearm for about a week. Was seen in the ED on 03/07 for this problem ans was started on antibiotics. Continued on Bactrim and keflex (end date 03/14) Prescriptions for one additional day were printed and handed to the patient prior to discharge.  ??  # HIV  PTA: Triumeq. HIV on HAART tx (2/21 viral load undetectable, CD4 471). Not diagnosed with AIDS. Follows Dr. Merilynn Finland. Continued home meds without noted complication.  ??      Condition on Discharge     1. Functional Status: Normal    2. Mental Status: Normal    3. Diet / Tube Feeding / TPN:  Diet Orders          Diet regular starting at 03/08 1956        As listed above    4. Respiratory / Lines & Tubes / Wounds:  None required    5. Discharge Physical Exam:  BP 120/89 (BP Location: Right arm, Patient Position: Lying)    Pulse 73    Temp 97.9 ??F (36.6 ??C) (Oral)    Resp 16    Ht 5' 9 (1.753 m)    Wt 151 lb 9.6 oz (68.8 kg)    SpO2 100%    BMI 22.39 kg/m??      Gen - Alert, no acute distress.   Eyes - Normal conjunctiva, normal pupils, anicteric sclera  ENT - Moist mucosa, no OP erythema or exudates  Neck - Supple, no thyromegaly  Lymph - No adenopathy cervical or supraclavicular  CV - RRR, no MRG, no elevated JVP, no peripheral edema  Lung - Clear to auscultation anteriorly and posteriorly, normal work of breathing. On room air.   Abd - Positive bowel sounds, soft, non distended, no organomegaly  MSK - No clubbing, no cyanosis. Mild erythema over bilateral forearms without fluctuance, edema  or raised areas. Full ROM of hands/wrists bilaterally.??Track marks along bilateral arms. 1cm nodule on R  forearm, tender to palpation.   Skin - Warm and dry, no rashes  Neuro - Alert, oriented to self/place/time/situation, no facial asymmetry, no gross focal weakness, sensation grossly intact to light touch. Mild tremors.   Psych - Anxious, pleasant      Disposition     Home independent      Follow-Up Appointments     No future appointments.    No follow-up provider specified.      Patient Instructions / Follow-Up Items for Receiving Physician         Signed:    Loney Laurence, CNP  Department of Internal Medicine  Pager ID [phone]  11:54 AM, 02/15/2020       Total time spent with patient was > 30 minutes.  Greater than 50% of that time was spent counseling and coordinating the care of the patient.  Please see my assessment and plan for details.

## 2020-02-15 NOTE — Nursing Note (Signed)
Pt. D/C. IV removed, Pt tolerated well. D/C teachings and follow-up appt. instructions completed. All pt concerns addressed. All belongings sent with pt.

## 2020-02-15 NOTE — Unmapped (Signed)
Department of General Internal Medicine  Daily Progress Note  Interval History     Initial plan was for patient to detox from alcohol and remain hospitalized until an inpatient addiction facility opened up a bed. This morning the patient requested to discharge home. We discussed remaining inpatient to control withdrawal symptoms given history of seizures. He is mentally competent to make his own decision to discharge at this time. He was given a script for x1 additional day of antibiotics to complete treatment of cellulitis. No additional medications were prescribed.     Loney Laurence, CNP  Department of Internal Medicine  Pager ID# [phone]  12:38 PM, 02/16/2020

## 2020-02-19 NOTE — Unmapped (Signed)
UR Nurse: Thornton Park RN  Phone number: [phone]  Fax number: [phone]  Patient status: INPATIENT  Reference#: [id number]    ========    Patient Information  Comment    Last edited by  on ??at   Patient Name MRN Legal Sex Date of Birth Social Security Number  Alexander [name], Alexander [name] [id number] Male Nov 27, 1986 [id number]  Email            PCP    RUEGG, HILJA  Demographics  Comment    Last edited by  on ??at   Address: Home Phone: Work Phone: Sport and exercise psychologist: ??  [address]   [place] [address] [phone]  [phone]   SSN: Insurance: Marital Status: Religion: ??  [id number] [facility]   Basic Information    Date Of Birth   17-Sep-1986 Gender Identity   Male Race   White or Caucasian Ethnicity   Not Hispanic or Latino Preferred Language   English  Documents Filed to Patient    Power of Attorney Living Will Clinical Unknown Study Attachment Consent Form ABN Waiver After Visit Summary Lab Result Scan Code Status My [place] Status Advance Care Planning  Not on File Not on File Not on File Not on File Not on File Not on File Filed Not on File Prior Pending Jump to the Activity??  Auth/Cert Information    Open Auth/Cert linked to Hospital Account [id number]  Admission Information    Current Information    Attending Provider Admitting Provider Admission Type Admission Status   Sherlyn Hay, MD PhD Emergency Discharged (Confirmed)  ??     Admission Date/Time Discharge Date Hospital Service Auth/Cert Status  02/10/20 ??02:25 PM 02/15/20 Medicine Incomplete  ??     Hospital Area Unit Room/Bed   [facility]  [facility] 4R [phone] ??  ??     Discharge Disposition Discharge Destination  Home or Self Care WITHOUT Home Care Services   Date and time when first inpatient    Date Time  Feb 10, 2020 ??7:56 PM    ??  Hospital Account    Name Acct ID Class Status Primary Coverage  Alexander [name] [id number] Inpatient Discharged/Not Billed MOLINA - MOLINA    ??  Guarantor Account (for Hospital Account [id number])    Name Relation to  Pt Service Area Active? Acct Type  Alexander [name] Self [facility] Yes Personal/Family  Address Phone    [address]   [place], [place] [address] [phone](H)      ??  Coverage Information (for Hospital Account [id number])    F/O Payor/Plan Precert #  MOLINA/MOLINA   Subscriber Subscriber #  Alexander [name] [id number]  Address Phone  [address] [address]   [place], [place] [address]     ??  Emergency Contact Information    Name: Alexander [name] Relationship: S  Address: No address given   City:  State:  Zip:  Phone: [phone]   Business phone:   Emergency Contact(s)    Name Relation Home Work Alexander [name] Sister [phone]  [phone]  Alexander [name] Mother [phone]    Alexander [name]  [phone]   [facility] Data    Account Account Name [facility] Date FPL Exp Date Family Size % FPL  Alexander [name] [[id number]] Alexander [name] Jul 05, 2013   Dec 05, 2012 Oct 04, 2013   Mar 04, 2013  [facility] 100% [11]   [facility] - [facility] [5]  Alexander [name] [[id number]] Alexander [name]  Alexander [name] [[id number]] Alexander [name]              ===============    [facility]  Department of Internal Medicine  Inpatient Discharge Summary  ??  Patient: Alexander [name]   MRN: [id number]   CSN: [id number]  ??  Date of Admission: 02/10/2020  Date of Discharge: 02/15/2020  Attending Physician: [name], MD   ??  Diagnoses Present on Admission  ??  Past Medical History:  Diagnosis Date  ??? Anxiety ??  ??? Bipolar disorder (CMS Dx) ??  ??? Bronchitis ??  ??? Depression ??  ??? Drug abuse and dependence (CMS Dx) ??  ??? H/O tooth extraction ??  ??? Hepatitis C ??  ??? Liver disease Hep C  ??? PTSD (post-traumatic stress disorder) ??  ??? Substance induced mood disorder (CMS Dx) ??  ??? Thyroid disease ??     Discharge Diagnoses  ??  Active Hospital Problems  ?? Diagnosis Date Noted  ??? Alcohol withdrawal (CMS Dx) [F10.239] 02/01/2020  ??    ??  Consulting Services (include reason)  ??  1. Addiction science: opioid use  2. SW:  disposition  ??  ??  Allergies  ??  No Known Allergies  ??  ??  Discharge Medications  ??  ??  Medication List  ??  TAKE these medications, which you were ALREADY TAKING     Quantity/Refills  cephALEXin 500 MG capsule  Commonly known as: KEFLEX  Take 1 capsule (500 mg total) by mouth 4 times daily before meals and at bedtime for 1 day. Indications: skin and skin structure infection  ?? Quantity: 4 capsule  For: skin and skin structure infection  Refills: 0  ??  methadone 10 mg/5 mL solution  Commonly known as: DOLOPHINE  Take 34 mg by mouth daily.  ?? Refills: 0  ??  sulfamethoxazole-trimethoprim 800-160 mg per tablet  Commonly known as: Bactrim DS  Take 2 tablets by mouth 2 times a day for 1 day. Indications: cellulitis  ?? Quantity: 4 tablet  For: cellulitis  Refills: 0  ??  Triumeq 600-50-300 mg Tab  Generic drug: abacavir-dolutegravir-lamivud  Take 1 tablet by mouth daily.  ?? Quantity: 30 tablet  Refills: 5  ??    ??  Reason for Admission  ??  Alexander [name] is a 34 y.o. male with PMH of HIV, IV drug abuse, and prior psychiatric admissions with history of SI, depression, opiate abuse, and alcohol abuse who presented to the ED today on 02/10/20 for alcohol withdrawal.  ??  ??  Hospital Course By Problem  ??  # Alcohol withdrawal  Presented to the ED with alcohol withdrawal symptoms. Patient with a history of 12 beers/day history for the past 10 years. Last drink was hours prior to arrival in the ED. History of seizures with alcohol withdrawal in the past, per patient.   Continued on CIWA protocol with elevated scores ranging between 17-20. Continued on CMU to monitor possible seizures. Had few episodes of tachycardia with HR up to 150s, improved with clonidine. Received daily valium scheduled, ativan prn and rally pack. Continued on zofran, hydroxyzine, ibuprofen. SW was consulted for alcohol rehab placement and set to discharge to [facility] on Monday 3/15. He ultimately decided he was ready to discharge home despite max education  regarding substance withdrawal.   ??  # Opiate Addiction??  History of IV drug use, fentanyl and heroin. PTA: Methadone 34mg  daily. Initially continued on home  dose Addiction medicine was consulted and recommended to increased methadone dose to 38mg  daily and if necessary add clonidine, zofran, loperamide, hydroxyzine and ibuprofen prn. During hospitalization, the pt snorted fentanyl which he found in his jacket pocket. He was monitored closely and remained hemodynamically stable. His belongs were removed from the room and visitors were restricted for the entirety of hospitalization.   ??  # Diarrhea   Complained of diarrhea for about 3 days prior admission. Likely related to opioid withdrawal. Continued on??loperamide 2mg  q2hs prn  ??  # Cellulitis  Erythema on bilateral forearm for about a week. Was seen in the ED on 03/07 for this problem ans was started on antibiotics. Continued on Bactrim and keflex (end date 03/14) Prescriptions for one additional day were printed and handed to the patient prior to discharge.  ??  # HIV  PTA: Triumeq. HIV on HAART tx (2/21 viral load undetectable, CD4 471). Not diagnosed with AIDS. Follows Dr. Merilynn Finland. Continued home meds without noted complication.  ??  ??  ??  Condition on Discharge  ??  1. Functional Status: Normal  ??  2. Mental Status: Normal  ??  3. Diet / Tube Feeding / TPN:  Diet Orders   ??    ?? Diet regular starting at 03/08 1956  ??  ??  As listed above  ??  4. Respiratory / Lines & Tubes / Wounds:  None required  ??  5. Discharge Physical Exam:  BP 120/89 (BP Location: Right arm, Patient Position: Lying)    Pulse 73    Temp 97.9 ??F (36.6 ??C) (Oral)    Resp 16    Ht 5' 9 (1.753 m)    Wt 151 lb 9.6 oz (68.8 kg)    SpO2 100%    BMI 22.39 kg/m??    ??  Gen??- Alert, no acute distress.   Eyes - Normal conjunctiva, normal pupils, anicteric sclera  ENT??- Moist mucosa, no OP erythema or exudates  Neck??- Supple, no thyromegaly  Lymph - No adenopathy cervical or supraclavicular  CV??- RRR, no  MRG, no elevated JVP, no peripheral edema  Lung??- Clear to auscultation anteriorly and posteriorly, normal work of breathing. On room air.   Abd??- Positive bowel sounds, soft,??non distended, no organomegaly  MSK??- No clubbing, no cyanosis. Mild erythema over bilateral forearms without fluctuance, edema or raised areas. Full ROM of hands/wrists bilaterally.??Track marks along bilateral arms. 1cm nodule on R forearm, tender to palpation.   Skin??- Warm and dry, no rashes  Neuro??- Alert, oriented to self/place/time/situation, no facial asymmetry, no gross focal weakness, sensation grossly intact to light touch. Mild tremors.??  Psych??- Anxious, pleasant  ??  ??  Disposition  ??  Home independent  ??

## 2020-03-27 MED ORDER — TRIUMEQ 600-50-300 mg Tab
600-50-300 | ORAL_TABLET | ORAL | 1 refills | Status: AC
Start: 2020-03-27 — End: 2020-10-03

## 2020-03-27 NOTE — Telephone Encounter (Signed)
Looks like he needs to be scheduled for a follow-up appointment, too.

## 2020-03-31 ENCOUNTER — Inpatient Hospital Stay
Admission: EM | Admit: 2020-03-31 | Discharge: 2020-04-05 | Disposition: A | Discharge status: Home / Self Care | Payer: PRIVATE HEALTH INSURANCE

## 2020-03-31 DIAGNOSIS — F10239 Alcohol dependence with withdrawal, unspecified: Secondary | ICD-10-CM

## 2020-03-31 LAB — VENOUS BLOOD GAS, LINE/SYRINGE
%HBO2-Line Draw: 61.7 % (ref 40.0–70.0)
Base Excess-Line Draw: 1.8 mmol/L (ref <=3.0)
CO2 Content-Line Draw: 28 mmol/L (ref 25–29)
Carboxyhgb-Line Draw: 6.1 % — ABNORMAL HIGH (ref 0.0–2.0)
HCO3-Line Draw: 27 mmol/L (ref 24–28)
Methemoglobin-Line Draw: 0.4 % (ref 0.0–1.5)
PCO2-Line Draw: 44 mmHg (ref 41–51)
PH-Line Draw: 7.4 (ref 7.32–7.42)
PO2-Line Draw: 35 mmHg (ref 25–40)
Reduced Hemoglobin-Line Draw: 31.8 % — ABNORMAL HIGH (ref 0.0–5.0)

## 2020-03-31 LAB — 2019 NOVEL CORONAVIRUS (COVID-19), NAA-B: SARS-CoV-2: NOT DETECTED

## 2020-03-31 LAB — LIPASE: Lipase: 29 U/L (ref 4–82)

## 2020-03-31 LAB — BASIC METABOLIC PANEL
Anion Gap: 15 mmol/L (ref 3–16)
BUN: 7 mg/dL (ref 7–25)
CO2: 23 mmol/L (ref 21–33)
Calcium: 9.6 mg/dL (ref 8.6–10.3)
Chloride: 96 mmol/L (ref 98–110)
Creatinine: 0.73 mg/dL (ref 0.60–1.30)
Glucose: 134 mg/dL (ref 70–100)
Osmolality, Calculated: 278 mOsm/kg (ref 278–305)
Potassium: 4 mmol/L (ref 3.5–5.3)
Sodium: 134 mmol/L (ref 133–146)
eGFR AA CKD-EPI: >90 See note.
eGFR NONAA CKD-EPI: >90 See note.

## 2020-03-31 LAB — HEPATIC FUNCTION PANEL
ALT: 106 U/L (ref 7–52)
AST: 174 U/L (ref 13–39)
Albumin: 4.5 g/dL (ref 3.5–5.7)
Alkaline Phosphatase: 102 U/L (ref 36–125)
Bilirubin, Direct: 0.1 mg/dL (ref 0.0–0.4)
Bilirubin, Indirect: 0.5 mg/dL (ref 0.0–1.1)
Total Bilirubin: 0.6 mg/dL (ref 0.0–1.5)
Total Protein: 8.5 g/dL (ref 6.4–8.9)

## 2020-03-31 LAB — DIFFERENTIAL
Basophils Absolute: 32 /uL (ref 0–200)
Basophils Relative: 0.4 % (ref 0.0–1.0)
Eosinophils Absolute: 32 /uL (ref 15–500)
Eosinophils Relative: 0.4 % (ref 0.0–8.0)
Lymphocytes Absolute: 1320 /uL (ref 850–3900)
Lymphocytes Relative: 16.5 % (ref 15.0–45.0)
Monocytes Absolute: 960 /uL (ref 200–950)
Monocytes Relative: 12 % (ref 0.0–12.0)
Neutrophils Absolute: 5656 /uL (ref 1500–7800)
Neutrophils Relative: 70.7 % (ref 40.0–80.0)
nRBC: 0 /100 WBC (ref 0–0)

## 2020-03-31 LAB — CBC
Hematocrit: 43.4 % (ref 38.5–50.0)
Hemoglobin: 14.9 g/dL (ref 13.2–17.1)
MCH: 30.2 pg (ref 27.0–33.0)
MCHC: 34.4 g/dL (ref 32.0–36.0)
MCV: 88 fL (ref 80.0–100.0)
MPV: 8.8 fL (ref 7.5–11.5)
Platelets: 155 10*3/uL (ref 140–400)
RBC: 4.93 10*6/uL (ref 4.20–5.80)
RDW: 14.4 % (ref 11.0–15.0)
WBC: 8 10*3/uL (ref 3.8–10.8)

## 2020-03-31 LAB — PHOSPHORUS: Phosphorus: 4.3 mg/dL (ref 2.1–4.7)

## 2020-03-31 LAB — MAGNESIUM: Magnesium: 2.2 mg/dL (ref 1.5–2.5)

## 2020-03-31 MED ORDER — heparin (porcine) injection 5,000 Units
5000 | Freq: Three times a day (TID) | INTRAMUSCULAR | Status: AC
Start: 2020-03-31 — End: 2020-04-05
  Administered 2020-04-01 – 2020-04-04 (×8): 5000 [IU] via SUBCUTANEOUS

## 2020-03-31 MED ORDER — multivitamin with folic acid tablet 1 tablet
400 | Freq: Once | ORAL | Status: AC
Start: 2020-03-31 — End: 2020-03-31
  Administered 2020-03-31: 1 via ORAL

## 2020-03-31 MED ORDER — diazePAM (VALIUM) tablet 10 mg
5 | Freq: Once | ORAL | Status: AC
Start: 2020-03-31 — End: 2020-03-31
  Administered 2020-03-31: 10 mg via ORAL

## 2020-03-31 MED ORDER — lactated Ringers 1,000 mL IV fluid
Freq: Once | INTRAVENOUS | Status: AC
Start: 2020-03-31 — End: 2020-03-31
  Administered 2020-03-31: via INTRAVENOUS

## 2020-03-31 MED ORDER — acetaminophen (TYLENOL) tablet 650 mg
325 | ORAL | Status: AC | PRN
Start: 2020-03-31 — End: 2020-04-05
  Administered 2020-04-03: 18:00:00 650 mg via ORAL

## 2020-03-31 MED ORDER — diazePAM (VALIUM) tablet 10 mg
5 | Freq: Once | ORAL | Status: AC
Start: 2020-03-31 — End: 2020-03-31
  Administered 2020-04-01: 04:00:00 10 mg via ORAL

## 2020-03-31 MED ORDER — thiamine mononitrate (VITAMIN B-1) tablet 100 mg
100 | Freq: Every day | ORAL | Status: AC
Start: 2020-03-31 — End: 2020-04-01
  Administered 2020-03-31: 100 mg via ORAL

## 2020-03-31 MED ORDER — folic acid (FOLVITE) tablet 1 mg
1 | Freq: Once | ORAL | Status: AC
Start: 2020-03-31 — End: 2020-03-31
  Administered 2020-03-31: 1 mg via ORAL

## 2020-03-31 MED ORDER — ondansetron (ZOFRAN) injection 4 mg
4 | Freq: Three times a day (TID) | INTRAMUSCULAR | Status: AC | PRN
Start: 2020-03-31 — End: 2020-04-05
  Administered 2020-04-01 – 2020-04-02 (×3): 4 mg via INTRAVENOUS

## 2020-03-31 MED ORDER — diazePAM (VALIUM) tablet 10 mg
5 | Freq: Once | ORAL | Status: AC
Start: 2020-03-31 — End: 2020-03-31
  Administered 2020-04-01: 02:00:00 10 mg via ORAL

## 2020-03-31 MED ORDER — ondansetron (ZOFRAN) tablet 4 mg
4 | Freq: Three times a day (TID) | ORAL | Status: AC | PRN
Start: 2020-03-31 — End: 2020-04-04
  Administered 2020-04-02 – 2020-04-04 (×3): 4 mg via ORAL

## 2020-03-31 MED FILL — ONDANSETRON HCL (PF) 4 MG/2 ML INJECTION SOLUTION: 4 4 mg/2 mL | INTRAMUSCULAR | Qty: 2

## 2020-03-31 MED FILL — DIAZEPAM 5 MG TABLET: 5 5 MG | ORAL | Qty: 2

## 2020-03-31 MED FILL — VITAMIN B-1 (MONONITRATE) 100 MG TABLET: 100 100 mg | ORAL | Qty: 1

## 2020-03-31 MED FILL — FOLIC ACID 1 MG TABLET: 1 1 MG | ORAL | Qty: 1

## 2020-03-31 MED FILL — HEPARIN (PORCINE) 5,000 UNIT/ML INJECTION SOLUTION: 5000 5,000 unit/mL | INTRAMUSCULAR | Qty: 1

## 2020-03-31 MED FILL — THERA 400 MCG TABLET: 400 400 mcg | ORAL | Qty: 1

## 2020-03-31 NOTE — Unmapped (Signed)
[place]  Alcohol Audit      Alexander [name]  [id number]  [birth year]      Alcohol Level  Alcohol Level obtained?: No  Appropriate for Audit?: Yes  Comments:N/A    Audit Questionnaire  How often do you have a drink containing alcohol?: 4 or more times a week  How many drinks do you have?: 10 or more  How often do you have 5 or more drinks?: Daily or almost daily  How often have you been unable to stop drinking?: Daily or almost daily  How often have you failed to do what was expected?: Daily or almost daily  How often have you needed a first drink in the morning?: Daily or almost daily  How often do you have guilt or remorse?: Daily or almost daily  How often have you been unable to remember what happened?: Monthly  Have you or someone else been injured due to your drinking?: Yes, but not in the last year  Have others been concerned about your drinking?: Yes, during the last year  Total Score: 36    Intervention: Score greater than or equal to 8 (greater than of equal to 4 for women and ages 16-21 & 75 or older) indicates a strong likelihood of hazardous or harmful alcohol consumption  Intervention: AUDIT & Brief Intervention 15-30 minutes  Comments: SW provided patient with resources but SUD team has been contacted to follow up with patient.  Patient is interested in addiction services.      Alexander [name] J Alexander [name]  03/31/2020  9:23 PM       Alexander [name] J. Alexander [name], MSW, LSW  [employer]  [phone]

## 2020-03-31 NOTE — Unmapped (Signed)
ED Attending Attestation Note    Date of service:  03/31/2020    This patient was seen by the resident physician.  I have seen and examined the patient, agree with the workup, evaluation, management and diagnosis. The care plan has been discussed and I concur.  I have reviewed the ECG and concur with the resident's interpretation.    My assessment reveals a 34 y.o. male who presents to the emergency department for concern for alcohol withdrawal.  Patient states that he drinks daily and last drank sometime earlier this morning.  He states he has had issues with alcohol withdrawal including hallucinations and seizures that has required hospitalization.  The patient is disheveled with evidence of tongue tremors. He also has a soft abdomen tenderness in the right upper quadrant.

## 2020-03-31 NOTE — Unmapped (Addendum)
SUD Team made aware of this patient's presence in the emergency department via:  Epic review    SUD Team will attempt to meet with this patient while in the emergency department.      Izzie Geers, PRS  Linkage Coordinator/Peer Recovery Specialist  Early Intervention Program (EIP) - SUD Team  Dept. of Emergency Medicine  [phone]

## 2020-03-31 NOTE — Unmapped (Signed)
SW met with patient for an Alcohol Audit, patient is interested in in-patient rehab, SW contacted several places, but was unable to secure treatment.  SW has reported to the team.  A call has been made to [facility] to make an attempt to reach patient if discharged.    Reece Agar. Vernisha Bacote, MSW, LSW  [employer]  [phone]

## 2020-03-31 NOTE — ED Triage Notes (Signed)
Pt here with complaints of alcohol withdrawal. Pt states last drink was this morning and he also used Fentanyl at that time. Pt also complains of abdominal pain. Tachycardic to 115, other VSS.

## 2020-03-31 NOTE — Unmapped (Signed)
[place] [facility] ED Note    Date of Service: 03/31/2020  Reason for Visit: Withdrawal-Alcohol      Patient History     HPI  Alexander [name] is a 34 y.o. male with a PMH of anxiety, bipolar disorder, hepatitis C and alcohol abuse (history of withdrawal seizures) who presents to the ED with concerns for alcohol withdrawal. He as recently hospitalized in March for this same concern. He states he was discharged from the hospital and was unable to go to his rehab facility due to methadone requirement which made him angry and he started binge drinking. He has been binge drinking for the past month and has not taken his methadone for >3weeks. He has been drinking 24 beers/day and recently drank an entire bottle of whisky. Today he has only had one drink. He has been doing fentanyl daily, last used this morning. Denies any hallucinations, seizures, auditory disturbances. He is having new RUQ pain which has been present for several days since he had the whisky and a few days ago he felt kidney pain. He complains of intermittent vomiting and increased frequency of urination with mild burning. Denies cough, SOB, chest pain, headache.     Other than stated above, no additional aggravating or alleviating factors are noted.    Past Medical History:   Diagnosis Date   ??? Anxiety    ??? Bipolar disorder (CMS Dx)    ??? Bronchitis    ??? Depression    ??? Drug abuse and dependence (CMS Dx)    ??? H/O tooth extraction    ??? Hepatitis C    ??? HIV infection (CMS Dx)    ??? Liver disease Hep C   ??? PTSD (post-traumatic stress disorder)    ??? Substance induced mood disorder (CMS Dx)    ??? Thyroid disease      Past Surgical History:   Procedure Laterality Date   ??? MANDIBLE SURGERY       Patient  reports that he has been smoking cigarettes. He has a 11.00 pack-year smoking history. He has never used smokeless tobacco. He reports current alcohol use. He reports current drug use. Drugs: Heroin, Other-see comments, Marijuana, and Cocaine.  Previous Medications     METHADONE (DOLOPHINE) 10 MG/5 ML SOLUTION    Take 34 mg by mouth daily.    TRIUMEQ 600-50-300 MG TAB    TAKE ONE TABLET BY MOUTH DAILY       Allergies:   Allergies as of 03/31/2020   ??? (No Known Allergies)       All nursing notes and triage notes were appropriately reviewed in the course of the creation of this note.     Review of Systems     Review of Systems   Constitutional: Positive for diaphoresis and malaise/fatigue. Negative for chills, fever and weight loss.   Eyes: Negative for blurred vision and photophobia.   Respiratory: Negative for cough, shortness of breath and wheezing.    Cardiovascular: Negative for chest pain.   Gastrointestinal: Positive for abdominal pain, nausea and vomiting. Negative for blood in stool, constipation, diarrhea and melena.   Genitourinary: Positive for dysuria.   Musculoskeletal: Positive for myalgias.   Neurological: Positive for tremors, weakness and headaches. Negative for seizures and loss of consciousness.   Psychiatric/Behavioral: Positive for substance abuse.     Physical Exam     Vitals:    03/31/20 2004 03/31/20 2104 03/31/20 2130 03/31/20 2212   BP: (!) 156/104 (!) 145/98 (!) 145/98 136/79  BP Location:       Patient Position:       Pulse: 84 89 106 91   Resp:       Temp:       TempSrc:       SpO2: 100% 96%  95%       Physical Exam  Constitutional:       Appearance: He is toxic-appearing.   HENT:      Head: Normocephalic and atraumatic.   Eyes:      Extraocular Movements: Extraocular movements intact.      Pupils: Pupils are equal, round, and reactive to light.   Cardiovascular:      Rate and Rhythm: Regular rhythm. Tachycardia present.   Pulmonary:      Effort: Pulmonary effort is normal.      Breath sounds: Normal breath sounds.   Abdominal:      Palpations: Abdomen is soft.      Tenderness: There is abdominal tenderness in the right upper quadrant. There is no guarding or rebound. Negative signs include Murphy's sign.   Skin:     General: Skin is moist.       Coloration: Skin is not jaundiced.   Neurological:      Mental Status: He is alert and oriented to person, place, and time.      Motor: Weakness and tremor present.   Psychiatric:         Behavior: Behavior is cooperative.       Diagnostic Studies     Labs:  Labs Reviewed   DIFFERENTIAL - Abnormal; Notable for the following components:       Result Value    Monocytes Absolute 960 (*)     All other components within normal limits   BASIC METABOLIC PANEL - Abnormal; Notable for the following components:    Chloride 96 (*)     Glucose 134 (*)     All other components within normal limits   HEPATIC FUNCTION PANEL - Abnormal; Notable for the following components:    AST 174 (*)     ALT 106 (*)     All other components within normal limits   VENOUS BLOOD GAS, LINE/SYRINGE - Abnormal; Notable for the following components:    Carboxyhgb-Line Draw 6.1 (*)     Reduced Hemoglobin-Line Draw 31.8 (*)     All other components within normal limits   CBC   URINALYSIS-MACROSCOPIC W/REFLEX TO MICROSCOPIC    Narrative:     Microscopic testing is not performed when the dipstick is negative for blood, leukocyte, protein and nitrite.   LIPASE   MAGNESIUM   PHOSPHORUS   2019 NOVEL CORONAVIRUS (COVID-19), NAA-B    Narrative:     Does the patient have symptoms of Covid-19 (eg. Fever, dyspnea, cough, loss of smell)?->No   ED ECG 12-LEAD (MUSE)    Narrative:     Ventricular Rate:  106  BPM  Atrial Rate:  106  BPM  P-R Interval:  124  ms  QRS Duration:  90  ms  QT:  340  ms  QTc:  451  ms  P Axis:  62  degrees  R Axis:  43  degrees  T Axis:  31  degrees  Diagnosis Line:  SINUS TACHYCARDIA ^ POSSIBLE LEFT ATRIAL ENLARGEMENT ^ BORDERLINE ECG       Radiology:  No orders to display       EKG:  EKG: sinus tachycardia, and possible left atrial involvement.  Emergency Department Procedures   None    ED Course and MDM     [name] is a 34 y.o. male with a history and presentation as described above in HPI.  The patient was evaluated by myself  and the ED Attending Physician, Dr. Janee Morn, and R4 Dr. Margo Aye. All management and disposition plans were discussed and agreed upon.    Upon presentation, the patient was anxious with visible tremors, hypertension and tachycardia but was afebrile. His initial CIWA was 20. Blood work was drawn and he was started on Valium, fluids, and a rally pack. UA was unremarkable. Transaminitis similar to prior elevations. CIWA was reassessed and was was improved to 13. He was given another 10mg  of valium. He did not have marked electrolyte imbalances, acid-base disturbances, respiratory distress, or signs of rhabdomyolysis. However his CIWA score did not decrease after the second round of valium and with his history of ETOH withdraw seizures he was deemed high risk and would benefit from admission. The patient would like to be admitted for help with his withdrawal and to be placed in a rehab facility once discharged.     Medications received during this ED visit:  Medications   folic acid (FOLVITE) tablet 1 mg (1 mg Oral Given 03/31/20 1940)     And   multivitamin with folic acid tablet 1 tablet (1 tablet Oral Given 03/31/20 1940)     And   thiamine mononitrate (VITAMIN B-1) tablet 100 mg (100 mg Oral Given 03/31/20 1940)   diazePAM (VALIUM) tablet 10 mg (has no administration in time range)   heparin (porcine) injection 5,000 Units (has no administration in time range)   acetaminophen (TYLENOL) tablet 650 mg (has no administration in time range)   ondansetron (ZOFRAN) tablet 4 mg (has no administration in time range)     Or   ondansetron (ZOFRAN) injection 4 mg (has no administration in time range)   diazePAM (VALIUM) tablet 10 mg (10 mg Oral Given 03/31/20 1940)   lactated Ringers 1,000 mL IV fluid ( Intravenous Stopped 03/31/20 2040)   diazePAM (VALIUM) tablet 10 mg (10 mg Oral Given 03/31/20 2140)     At this time the patient has been admitted to Medicine for further evaluation and management of his alcohol withdrawal. The patient  will continue to be monitored here in the emergency department until which time he is moved to his new treatment location.    Impression     1. Alcohol withdrawal syndrome without complication (CMS Dx)         Plan       Admit:  The patient is to be admitted to medicine. Discussed with admitting team.    Luanna Cole, DPM, PGY-1  [facility]    Critical Care Time (Attendings)          Luanna Cole, DPM  Resident  03/31/20 [phone]

## 2020-03-31 NOTE — Unmapped (Signed)
Department of Internal Medicine  History & Physical    Patient: Alexander [name]  MRN: [id number]  CSN: [id number]    Chief Complaint     Alcohol w/d    History of Present Illness     Alexander [name] is a 34 y.o. male with a history of HIV, HCV, PTSD, presenting to the hospital with alcohol withdrawal.     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 Previously on methadone, hospitalized in march for similar    Patient is coming in desiring to withdrawal safely and pursue rehab. Patient typically drinks 14-24 oz beers a day, occasionally will drink liquor as well. He has a history of seizure and hallucinations with previous withdrawal experiences. He has been experiencing nausea, vomiting, blood streaked emesis, abdominal pain.     He also uses IV fentanyl. He previously was seen in methadone clinic, but was removed from clinic d/t alcohol use. This subsequently increased his alcohol use. Last use was this morning. Has had skin infections secondary to use. No other complications.     He denies any sick sx; however he does report dysuria and frequency.     In the ED, patient received 3 doses of 10 valium, oral rally pack    Review of Systems     Review of Systems   Constitutional: Negative for chills and fever.   HENT: Negative for congestion and sore throat.    Eyes: Positive for double vision. Negative for discharge and redness.   Respiratory: Negative for cough and shortness of breath.    Cardiovascular: Positive for palpitations. Negative for chest pain and leg swelling.   Gastrointestinal: Positive for abdominal pain, nausea and vomiting. Negative for constipation and diarrhea.   Genitourinary: Positive for dysuria and frequency.   Skin: Positive for rash.   Neurological: Negative for dizziness, tingling and focal weakness.   Psychiatric/Behavioral: Positive for hallucinations and substance abuse. The patient is nervous/anxious.          Past Medical History     Past Medical History:   Diagnosis Date   ??? Anxiety    ???  Bipolar disorder (CMS Dx)    ??? Bronchitis    ??? Depression    ??? Drug abuse and dependence (CMS Dx)    ??? H/O tooth extraction    ??? Hepatitis C    ??? HIV infection (CMS Dx)    ??? Liver disease Hep C   ??? PTSD (post-traumatic stress disorder)    ??? Substance induced mood disorder (CMS Dx)    ??? Thyroid disease        Past Surgical History     Past Surgical History:   Procedure Laterality Date   ??? MANDIBLE SURGERY           Family History     Family History   Problem Relation Age of Onset   ??? Drug abuse Brother    ??? Suicidality Brother    ??? Hypertension Mother    ??? Transient ischemic attack Mother    ??? Seizures Father    ??? Hypertension Father    ??? Cancer Father    ??? Breast Cancer Father    ??? Suicidality Father    ??? Drug abuse Brother    ??? Suicidality Brother          Social History     Smokes 2ppd   Drinks significant amount of alcohol, see HPI  Uses IV fentanyl    Social History  Socioeconomic History   ??? Marital status: Single     Spouse name: Not on file   ??? Number of children: Not on file   ??? Years of education: Not on file   ??? Highest education level: Not on file   Occupational History   ??? Not on file   Social Needs   ??? Financial resource strain: Not on file   ??? Food insecurity     Worry: Not on file     Inability: Not on file   ??? Transportation needs     Medical: Not on file     Non-medical: Not on file   Tobacco Use   ??? Smoking status: Current Every Day Smoker     Packs/day: 1.00     Years: 11.00     Pack years: 11.00     Types: Cigarettes   ??? Smokeless tobacco: Never Used   Substance and Sexual Activity   ??? Alcohol use: Yes     Comment: 10/17/17: reports drinking ~1 day per week   ??? Drug use: Yes     Types: Heroin, Other-see comments, Marijuana, Cocaine     Comment: 10/17/17: reports last illicit drug use ~1 month ago   ??? Sexual activity: Yes     Partners: Female, Male     Birth control/protection: Condom, Other-see comments   Lifestyle   ??? Physical activity     Days per week: Not on file     Minutes per session:  Not on file   ??? Stress: Not on file   Relationships   ??? Social Wellsite geologist on phone: Not on file     Gets together: Not on file     Attends religious service: Not on file     Active member of club or organization: Not on file     Attends meetings of clubs or organizations: Not on file     Relationship status: Not on file   ??? Intimate partner violence     Fear of current or ex partner: Not on file     Emotionally abused: Not on file     Physically abused: Not on file     Forced sexual activity: Not on file   Other Topics Concern   ??? Caffeine Use Yes   ??? Occupational Exposure No   ??? Exercise No   ??? Seat Belt Yes   Social History Narrative   ??? Not on file         Medications     Home Meds:  Prior to Admission medications    Medication Sig Start Date End Date Taking? Authorizing Provider   methadone (DOLOPHINE) 10 mg/5 mL solution Take 34 mg by mouth daily.    Historical Provider, MD   TRIUMEQ 600-50-300 mg Tab TAKE ONE TABLET BY MOUTH DAILY 03/27/20   Curly Rim, MD          Inpatient Meds:  Scheduled:  ??? diazePAM  10 mg Oral Once   ??? thiamine  100 mg Oral Daily 0900       Continuous:      PRN:      Vital Signs     Temp:  [97.7 ??F (36.5 ??C)-98.2 ??F (36.8 ??C)] 97.7 ??F (36.5 ??C)  Heart Rate:  [84-124] 91  Resp:  [17-20] 20  BP: (136-187)/(79-118) 136/79  No intake or output data in the 24 hours ending 03/31/20 2334    Physical Exam  General: ill-appearing man, thin, laying in bed, appears anxious  HEENT: tracks with eyes, EOM intact, no nystagmus, no scleral icterus. MMM  Neck: supple, full ROM.   Pulmonary: clear to auscultation bilaterally, normal respiratory effort, no cyanosis.   CVS:  normal S1 S2, tachycardic. Radial and dp pulses palpable bilaterally. no LEE.   Abdomen:  BS+, soft, nondistended, tender throughout epigastrium  Skin: warm and diaphoretic. Color, texture, turgor normal.  No rashes or lesions  Neurological: awake, alert. face symmetric, no aphasia or dysarthria, very tremulous.  Moves all 4 extremities spontaneously.   Psych: normal mood and affect. interacts appropriately.      Laboratory Data           Lab 03/31/20  1915   WBC 8.0   HEMOGLOBIN 14.9   HEMATOCRIT 43.4   MEAN CORPUSCULAR VOLUME 88.0   PLATELETS 155           Lab 03/31/20  1915   SODIUM 134   POTASSIUM 4.0   CHLORIDE 96*   CO2 23   BUN 7   CREATININE 0.73   GLUCOSE 134*           Lab 03/31/20  1937 03/31/20  1915   CALCIUM  --  9.6   MAGNESIUM 2.2  --    PHOSPHORUS 4.3  --                  Lab 03/31/20  1915   ALT 106*   AST 174*   ALK PHOS 102   BILIRUBIN TOTAL 0.6   BILIRUBIN DIRECT 0.1   ALBUMIN 4.5           Lab 03/31/20  2139   COLOR, URINE Yellow   CLARITY Clear   SPECIFIC GRAVITY, URINE 1.015   PH UA 6.0   PROTEIN UA Negative   GLUCOSE UA Negative   KETONES UA Negative   BILIRUBIN UA Negative   BLOOD UA Negative   NITRITE UA Negative   UROBILINOGEN UA 0.2 E.U./dL   LEUKOCYTES UA Negative             No results found for: NTPROBNP    Lab Results   Component Value Date    TSH 1.21 10/17/2017    FREET4 1.08 11/14/2013             Microbiology Results     No orders found from 03/02/2020 to 04/01/2020.                    Lab 03/31/20  2139   COLOR, URINE Yellow   CLARITY Clear   SPECIFIC GRAVITY, URINE 1.015   PH UA 6.0   PROTEIN UA Negative   GLUCOSE UA Negative   KETONES UA Negative   BILIRUBIN UA Negative   BLOOD UA Negative   NITRITE UA Negative   UROBILINOGEN UA 0.2 E.U./dL   LEUKOCYTES UA Negative             No results found for: NTPROBNP    Lab Results   Component Value Date    TSH 1.21 10/17/2017    FREET4 1.08 11/14/2013             Microbiology Results     No orders found from 03/02/2020 to 04/01/2020.             Diagnostic Studies     No orders to display       Assessment & Plan     Alexander Luviano  [name] is a 33 y.o. male being admitted to the hospital for alcohol withdrawal. Medical problems being addressed in this encounter include the following:    Active Problems:    * No active hospital problems. *    Alcohol use  disorder  Patient reportedly drinking 14-24oz 8% beers daily. Occasionally drinks liquor as well. Has a hx of seizure and hallucinations in the context of w/d. At this time, pt is tachycardic, intermittently hypertensive, tremulous and diaphoretic, despite significant doses of valium--appears to be in acute withdrawal.   -oral rally pack daily  -ciwa protocol  -scheduled diazepam  -prn lorazepam    Nausea/vomiting  Blood streaked x1. Doesn't seem like blood loss is significant. Hb 14.9. Lipase wnl, transaminases elevated AST>ALT. Alcohol/fentanyl w/d   -consider RUQ [place] in AM    Elevated transaminases  Likely secondary to alcohol use. HCV positive previously. Unlikely obstructive without bili and alkphos elevations.   -hepatitis panel (except c)   -consider RUQ [place] in AM     HIV  Viral load undetectable in [facility]. On triumeq at home, takes regularly. Hasn't been able to tolerate meds x2 days.   -restart home triumeq    HCV  Positive 2018. Last quant 3 million, genotype 3 in 08/2019.   -can consider tx o/p    Opiate  At this point, patient endorses nausea, vomiting,  doesn' t endorse diarrhea.   -cows  -will consider symptomatic treatment if necessary    dyruia  UA negative for infection. No leukocytosis. Will monitor for changes    Fluids: none  Electrolytes: none  Nutrition:  Diet Orders          Diet regular starting at 04/27 2345        GI/GU ppx: none  DVT ppx: sqh  Code Status: Prior    Signed:  Quin Hoop, MD  03/31/2020, 11:34 PM

## 2020-03-31 NOTE — ED Notes (Signed)
P's HR got up to 190s. Upon entering room, pt is sitting on side of bed. States his heart is racing. Had pt lay in bed. Informed MD. EKG performed and handed to MD. Pt's HR began to come down to 110. Will continue to monitor.

## 2020-03-31 NOTE — Unmapped (Signed)
Pt resting in bed with eyes closed.  Breathing is even and non labored.  Bed is in lowest locked position and call light is within reach.  Pt denies any needs at this time.  Will continue to monitor.

## 2020-04-01 LAB — CBC
Hematocrit: 41.1 % (ref 38.5–50.0)
Hemoglobin: 14.4 g/dL (ref 13.2–17.1)
MCH: 30.2 pg (ref 27.0–33.0)
MCHC: 35.1 g/dL (ref 32.0–36.0)
MCV: 86.1 fL (ref 80.0–100.0)
MPV: 9.2 fL (ref 7.5–11.5)
Platelets: 146 10*3/uL (ref 140–400)
RBC: 4.78 10*6/uL (ref 4.20–5.80)
RDW: 14.6 % (ref 11.0–15.0)
WBC: 5.1 10*3/uL (ref 3.8–10.8)

## 2020-04-01 LAB — URINE DRUG SCREEN WITHOUT CONFIRMATION, STAT
Amphetamine, 500 ng/mL Cutoff: NEGATIVE
Barbiturates UR, 300  ng/mL Cutoff: NEGATIVE
Benzodiazepines UR, 300 ng/mL Cutoff: NEGATIVE
Buprenorphine, 5 ng/mL Cutoff: NEGATIVE
Cocaine UR, 300 ng/mL Cutoff: NEGATIVE
Fentanyl, 2 ng/mL Cutoff: POSITIVE
Methadone, UR, 300 ng/mL Cutoff: NEGATIVE
Opiates UR, 300 ng/mL Cutoff: NEGATIVE
Oxycodone, 100 ng/mL Cutoff: NEGATIVE
THC UR, 50 ng/mL Cutoff: NEGATIVE
Tricyclic Antidepressants, 300 ng/mL Cutoff: NEGATIVE

## 2020-04-01 LAB — RENAL FUNCTION PANEL W/EGFR
Albumin: 3.9 g/dL (ref 3.5–5.7)
Anion Gap: 11 mmol/L (ref 3–16)
BUN: 8 mg/dL (ref 7–25)
CO2: 24 mmol/L (ref 21–33)
Calcium: 9.3 mg/dL (ref 8.6–10.3)
Chloride: 97 mmol/L (ref 98–110)
Creatinine: 0.59 mg/dL (ref 0.60–1.30)
Glucose: 91 mg/dL (ref 70–100)
Osmolality, Calculated: 272 mOsm/kg (ref 278–305)
Phosphorus: 4.1 mg/dL (ref 2.1–4.7)
Potassium: 4.8 mmol/L (ref 3.5–5.3)
Sodium: 132 mmol/L (ref 133–146)
eGFR AA CKD-EPI: >90 See note.
eGFR NONAA CKD-EPI: >90 See note.

## 2020-04-01 LAB — URINALYSIS-MACROSCOPIC W/REFLEX TO MICROSCOPIC
Bilirubin, UA: NEGATIVE
Blood, UA: NEGATIVE
Glucose, UA: NEGATIVE mg/dL
Ketones, UA: NEGATIVE mg/dL
Leukocyte Esterase, UA: NEGATIVE
Nitrite, UA: NEGATIVE
Protein, UA: NEGATIVE mg/dL
Specific Gravity, UA: 1.015 (ref 1.005–1.035)
Urobilinogen, UA: 0.2 EU/dL (ref 0.2–1.0)
pH, UA: 6 (ref 5.0–8.0)

## 2020-04-01 LAB — HEPATIC FUNCTION PANEL
ALT: 88 U/L (ref 7–52)
AST: 146 U/L (ref 13–39)
Albumin: 3.9 g/dL (ref 3.5–5.7)
Alkaline Phosphatase: 86 U/L (ref 36–125)
Bilirubin, Direct: 0.2 mg/dL (ref 0.0–0.4)
Bilirubin, Indirect: 0.8 mg/dL (ref 0.0–1.1)
Total Bilirubin: 0.9 mg/dL (ref 0.0–1.5)
Total Protein: 7.5 g/dL (ref 6.4–8.9)

## 2020-04-01 LAB — MAGNESIUM: Magnesium: 2.2 mg/dL (ref 1.5–2.5)

## 2020-04-01 LAB — HEPATITIS B SURFACE ANTIBODY, QUANTITATIVE
HBSAB NUMBER: 674.35 m[IU]/mL (ref 0.00–7.99)
Hep B S Ab: REACTIVE

## 2020-04-01 LAB — HEPATITIS A ANTIBODY TOTAL: Hep A IgG: REACTIVE

## 2020-04-01 LAB — HEPATITIS B SURFACE ANTIGEN: Hep B Surface Ag: NONREACTIVE

## 2020-04-01 LAB — HEPATITIS B CORE ANTIBODY: Hep B Core Total Ab: NONREACTIVE

## 2020-04-01 MED ORDER — loperamide (IMODIUM) capsule 4 mg
2 | Freq: Four times a day (QID) | ORAL | Status: AC | PRN
Start: 2020-04-01 — End: 2020-04-04
  Administered 2020-04-01 – 2020-04-04 (×3): 4 mg via ORAL

## 2020-04-01 MED ORDER — folic acid (FOLVITE) tablet 1 mg
1 | Freq: Every day | ORAL | Status: AC
Start: 2020-04-01 — End: 2020-04-03
  Administered 2020-04-01 – 2020-04-03 (×3): 1 mg via ORAL

## 2020-04-01 MED ORDER — thiamine mononitrate (VITAMIN B-1) tablet 100 mg
100 | Freq: Every day | ORAL | Status: AC
Start: 2020-04-01 — End: 2020-04-03
  Administered 2020-04-01 – 2020-04-03 (×3): 100 mg via ORAL

## 2020-04-01 MED ORDER — abacavir-dolutegravir-lamivud (TRIUMEQ) 600-50-300 mg tablet 1 tablet
600-50-300 | Freq: Every day | ORAL | Status: AC
Start: 2020-04-01 — End: 2020-04-05
  Administered 2020-04-01 – 2020-04-04 (×4): 1 via ORAL

## 2020-04-01 MED ORDER — nicotine (NICODERM CQ) 21 mg/24 hr 1 patch
21 | Freq: Every day | TRANSDERMAL | Status: AC
Start: 2020-04-01 — End: 2020-04-05
  Administered 2020-04-01 – 2020-04-04 (×4): 1 via TRANSDERMAL

## 2020-04-01 MED ORDER — diazePAM (VALIUM) tablet 10 mg
5 | Freq: Three times a day (TID) | ORAL | Status: AC
Start: 2020-04-01 — End: 2020-04-04
  Administered 2020-04-01 – 2020-04-04 (×8): 10 mg via ORAL

## 2020-04-01 MED ORDER — nicotine polacrilex (NICORETTE) gum 4 mg
2 | BUCCAL | Status: AC | PRN
Start: 2020-04-01 — End: 2020-04-05
  Administered 2020-04-04 (×2): 4 mg via BUCCAL

## 2020-04-01 MED ORDER — electrolyte-R (pH 7.4) (NORMOSOL-R pH 7.4) iv solution SolP
INTRAVENOUS | Status: AC
Start: 2020-04-01 — End: 2020-04-01
  Administered 2020-04-01: 14:00:00 75 mL/h via INTRAVENOUS

## 2020-04-01 MED ORDER — cloNIDine HCL (CATAPRES) tablet 0.1 mg
0.1 | Freq: Two times a day (BID) | ORAL | Status: AC | PRN
Start: 2020-04-01 — End: 2020-04-04
  Administered 2020-04-04: 0.1 mg via ORAL

## 2020-04-01 MED ORDER — LORazepam (ATIVAN) injection 2 mg
2 | INTRAMUSCULAR | Status: AC | PRN
Start: 2020-04-01 — End: 2020-04-05
  Administered 2020-04-01 – 2020-04-04 (×18): 2 mg via INTRAVENOUS

## 2020-04-01 MED ORDER — diazePAM (VALIUM) Syrg 10 mg
5 | Freq: Three times a day (TID) | INTRAMUSCULAR | Status: AC
Start: 2020-04-01 — End: 2020-04-04
  Administered 2020-04-02 – 2020-04-03 (×2): 10 mg via INTRAVENOUS

## 2020-04-01 MED ORDER — multivitamin with folic acid tablet 1 tablet
400 | Freq: Every day | ORAL | Status: AC
Start: 2020-04-01 — End: 2020-04-05
  Administered 2020-04-01 – 2020-04-04 (×4): 1 via ORAL

## 2020-04-01 MED ORDER — LORazepam (ATIVAN) injection 4 mg
2 | INTRAMUSCULAR | Status: AC | PRN
Start: 2020-04-01 — End: 2020-04-05
  Administered 2020-04-01 – 2020-04-03 (×9): 4 mg via INTRAVENOUS

## 2020-04-01 MED ORDER — ibuprofen (MOTRIN) tablet 400 mg
400 | ORAL | Status: AC | PRN
Start: 2020-04-01 — End: 2020-04-04

## 2020-04-01 MED FILL — LORAZEPAM 2 MG/ML INJECTION SOLUTION: 2 2 mg/mL | INTRAMUSCULAR | Qty: 2

## 2020-04-01 MED FILL — HEPARIN (PORCINE) 5,000 UNIT/ML INJECTION SOLUTION: 5000 5,000 unit/mL | INTRAMUSCULAR | Qty: 1

## 2020-04-01 MED FILL — DIAZEPAM 5 MG TABLET: 5 5 MG | ORAL | Qty: 2

## 2020-04-01 MED FILL — TRIUMEQ 600 MG-50 MG-300 MG TABLET: 600-50-300 600-50-300 mg | ORAL | Qty: 1

## 2020-04-01 MED FILL — THERA 400 MCG TABLET: 400 400 mcg | ORAL | Qty: 1

## 2020-04-01 MED FILL — LOPERAMIDE 2 MG CAPSULE: 2 2 mg | ORAL | Qty: 2

## 2020-04-01 MED FILL — VITAMIN B-1 (MONONITRATE) 100 MG TABLET: 100 100 mg | ORAL | Qty: 1

## 2020-04-01 MED FILL — LORAZEPAM 2 MG/ML INJECTION SOLUTION: 2 2 mg/mL | INTRAMUSCULAR | Qty: 1

## 2020-04-01 MED FILL — NICOTINE 21 MG/24 HR DAILY TRANSDERMAL PATCH: 21 21 mg/24 hr | TRANSDERMAL | Qty: 1

## 2020-04-01 MED FILL — NICOTINE (POLACRILEX) 4 MG GUM: 4 4 MG | BUCCAL | Qty: 1

## 2020-04-01 MED FILL — ONDANSETRON HCL 4 MG TABLET: 4 4 MG | ORAL | Qty: 1

## 2020-04-01 MED FILL — FOLIC ACID 1 MG TABLET: 1 1 MG | ORAL | Qty: 1

## 2020-04-01 NOTE — Unmapped (Signed)
Problem: Acute Pain  Description: Patient's pain progressing toward patient's stated pain goal  Goal: Patient displays improved well-being such as baseline levels for pulse, BP, respirations and relaxed muscle tone or body posture  Outcome: Progressing     Problem: Knowledge Deficit  Goal: Patient/family/caregiver demonstrates understanding of disease process, treatment plan, medications, and discharge instructions  Description: Complete learning assessment and assess knowledge base.  Outcome: Progressing     Problem: Potential for Compromised Skin Integrity  Goal: Skin integrity is maintained or improved  Description: Assess and monitor skin integrity. Identify patients at risk for skin breakdown on admission and per policy. Collaborate with interdisciplinary team and initiate plans and interventions as needed.  Outcome: Progressing  Goal: Nutritional status is improving  Description: Monitor and assess patient for malnutrition (ex- brittle hair, bruises, dry skin, pale skin and conjunctiva, muscle wasting, smooth red tongue, and disorientation). Collaborate with interdisciplinary team and initiate plan and interventions as ordered.  Monitor patient's weight and dietary intake as ordered or per policy. Utilize nutrition screening tool and intervene per policy. Determine patient's food preferences and provide high-protein, high-caloric foods as appropriate.   Outcome: Progressing     Problem: Incontinence  Goal: Perineal skin integrity is maintained or improved  Description: Assess genitourinary system, perineal skin, labs (urinalysis), and history of incontinence to include past management, aggravating, and alleviating factors.  Collaborate with interdisciplinary team and initiate plans and interventions as needed.  Outcome: Progressing     Problem: Knowledge Deficit  Goal: Patient/family/caregiver demonstrates understanding of disease process, treatment plan, medications, and discharge instructions  Description:  Complete learning assessment and assess knowledge base.  Outcome: Progressing     Problem: Fall Prevention  Goal: Patient will remain free of falls  Description: Assess and monitor vitals signs, neurological status including level of consciousness and orientation.  Reassess fall risk per hospital policy.    Ensure arm band on, uncluttered walking paths in room, adequate room lighting, call light and overbed table within reach, bed in low position, wheels locked, side rails up per policy (excluding SNF), and non-skid footwear provided.   Outcome: Progressing

## 2020-04-01 NOTE — Unmapped (Signed)
ZOX:[id number]    Patient needs a Liver outpatient follow-up called bedside schedule Misty Stanley awaiting on a return call back to schedule appointment.    Charm Rings  Care Management Assistant  [phone]

## 2020-04-01 NOTE — ED Notes (Signed)
Pt resting comfortably in bed with eyes closed. CIWA score 5. Will continue to monitor.

## 2020-04-01 NOTE — Unmapped (Signed)
ADDICTION CONSULT, INITIAL EVALUATION    ASSESSMENT AND RECOMMENDATIONS:    Opioid use disorder, severe; opioid withdrawal - pt previously involved with methadone clinic, but left treatment due to issues with being able to get into a residential treatment program in order to treat his alcohol use while on methadone. Pt not interested in returning. Open to buprenorphine, but only for detox purposes. Pt reports concern about starting too early due to risk of precipitated withdrawal and wishes to wait until at least 48 hours from last use, which would be tomorrow morning.   -Can consider use of buprenorphine for detox to start tomorrow morning.   --Note that induction and rating of COWS will be complicated by alcohol withdrawal. It is possible that pt would not be in enough opioid withdrawal and could precipitate withdrawal despite the COWS value being elevated enough due to elevation being due to alcohol withdrawal. Recommend monitoring for worsened withdrawal overall within an hour-2 hrs after initial buprenorphine dose.   -Primary team can enter order for buprenorphine for withdrawal tomorrow morning, or can reach out to addictions to have order entered.   -If prefer not to utilize buprenorphine due to risk of precipitated withdrawal, note that benzodiazepines used for alcohol withdrawal should also greatly decrease and potentially help to treatment opioid withdrawal as well, but may require higher benzodiazepine doses.     Alcohol use disorder, severe; alcohol withdrawal - Pt with h/o complicated withdrawal. This withdrawal and it's measurement is further complicated by co-occurring opioid withdrawal.   -Agree with CIWA triggered benzodiazepine dosing for alcohol withdrawal.   -Agree with baseline use of benzodiazepine. If there are concerns about patient's liver function (pt reports h/o significant liver disease), then would consider switching to non-hepatically metabolized benzodiazepine rather than diazepam.    -Agree with thiamine    Pt reports interest in getting into residential treatment. Robie Ridge from [facility] has met with patient to discuss connecting to programs. Currently [facility] vs [facility]. Pt had been asked to reach out to [facility] to complete telephone screening between 8a-4p.     Will continue to follow.     Chrys Racer, MD      [name] [phone]     Date/time of admission: 03/31/2020  6:27 PM  Time spent with patient:46min    CC/Reason for Consult:here for supervised withdrawal alcohol and opiates - hx of - withdrawal seizures - wants treatment and rehab    HPI: Alexander [name] is a 34 y.o. year old male admitted for alcohol withdrawal with addiction consult for here for supervised withdrawal alcohol and opiates - hx of - withdrawal seizures - wants treatment and rehab    Pt known to physician from prior consults and outpatient treatment at [facility]. Pt had been admitted for alcohol withdrawal in early March. At that time, pt was to try to hold his methadone dose at a reduced amount in order to go to a residential program. Opted out of going to [name] where he may have been able to continue methadone and instead left AMA. Was later assisted with admission to [facility], but pt had relapsed to alcohol use in the interim. He reports that he went into withdrawal at [facility] and thus left and went to the ER when he started withdrawing. He reports that after he got out of the hospital around [place], he began drinking and using fentanyl again. Has escalated his alcohol and fentanyl use. He reports last use of each was the day of admission. He  reports he had drank less alcohol yesterday prior to presentation. He currently reports withdrawal symptoms from both. He reports difficulty separating them out and notes that he has sweats, diarrhea, anxiety, and tremor.    Pt reports not being interested in ongoing MOUD treatment with an agonist (either methadone or buprenorphine). He is open to  detoxification with buprenorphine. He reports concerns about precipitated withdrawal if taken less than 2 days from last use.       Substance Treatment: h/o suboxone treatment in the past. Was recently admitted with [facility] methadone program, but left the program after inpatient admission in March in order to try to go to residential programs that do not accept patients on methadone.     Past Psychiatric History:    Prior diagnoses: PTSD              Prior inpatient psychiatric hospitalizations: last in 2015              Outpatient Treatment: Was working with Dr Lawerance Sabal at [facility], but unclear if he is still in treatment     Past Medical History:      Past Medical History:   Diagnosis Date   ??? Anxiety    ??? Bipolar disorder (CMS Dx)    ??? Bronchitis    ??? Depression    ??? Drug abuse and dependence (CMS Dx)    ??? H/O tooth extraction    ??? Hepatitis C    ??? HIV infection (CMS Dx)    ??? Liver disease Hep C   ??? PTSD (post-traumatic stress disorder)    ??? Substance induced mood disorder (CMS Dx)    ??? Thyroid disease       Past Surgical History:     Past Surgical History:   Procedure Laterality Date   ??? MANDIBLE SURGERY         Social/Developmental History:   Pt lives alone in apartment. Has CM through [place].     Family History:      Family History   Problem Relation Age of Onset   ??? Drug abuse Brother    ??? Suicidality Brother    ??? Hypertension Mother    ??? Transient ischemic attack Mother    ??? Seizures Father    ??? Hypertension Father    ??? Cancer Father    ??? Breast Cancer Father    ??? Suicidality Father    ??? Drug abuse Brother    ??? Suicidality Brother        Allergies: No Known Allergies    Home Medications:   No current facility-administered medications on file prior to encounter.      Current Outpatient Medications on File Prior to Encounter   Medication Sig Dispense Refill   ??? TRIUMEQ 600-50-300 mg Tab TAKE ONE TABLET BY MOUTH DAILY 30 tablet 1   ??? [DISCONTINUED] methadone (DOLOPHINE) 10 mg/5 mL solution Take 34 mg by  mouth daily.         Current Medications ordered:  Scheduled Meds:  ??? abacavir-dolutegravir-lamivud  1 tablet Oral Daily 0900   ??? diazePAM  10 mg Oral TID    Or   ??? diazePAM  10 mg Intravenous TID   ??? folic acid  1 mg Oral Daily 0900    And   ??? multivitamin with folic acid  1 tablet Oral Daily 0900    And   ??? thiamine  100 mg Oral Daily 0900   ??? heparin  5,000 Units Subcutaneous  3 times per day   ??? nicotine  1 patch Transdermal Daily 0900     Continuous Infusions:  PRN Meds:.acetaminophen, cloNIDine HCL, ibuprofen, loperamide, LORazepam **OR** LORazepam, nicotine **AND** nicotine polacrilex, ondansetron **OR** ondansetron    ROS: Reports sweating, anxiety, chills, stomach cramps, diarrhea, tremors. Denies trouble with fever, rash, headache, vision changes, chest pain, shortness of breath, extremity pain, weakness, dysuria.    OBJECTIVE:  Vitals:  Marland Kitchen  Vitals:    04/01/20 1832 04/01/20 1900 04/01/20 1933 04/01/20 1938   BP: (!) 139/94 (!) 140/91 148/83    BP Location:   Left arm    Patient Position:   Lying    Pulse: 67 64 74    Resp: 16 18 16     Temp:  97.8 ??F (36.6 ??C) 98.3 ??F (36.8 ??C)    TempSrc:  Oral Axillary    SpO2:   100%    Weight:  150 lb (68 kg)     Height:    5' 9 (1.753 m)       Mental Status Exam:   Gait/Station: not assessed  Gen/Behav: WD. Appears older than stated age lying in hospital bed fully covered with blanket. Hair is disheveled and it is hard to see his eyes through the film on his glasses. Calm and cooperative with assessment. Tremulous. When lying in bed with knees bent, his legs are visibly trembling. Yawns frequently through assessment.   Speech: nl rate, volume and production.   Mood/Affect: anxious/full, congruent, nl range  Thought process:  linear and goal directed, no FOI, no LOA  Thought content: denies SI/HI. No delusions voiced  Perceptions: denies A/V hallucinations  Cognition: A&Oxself, place, reason for hospitalization, and year, but does not know the day, date or month.  recent and remote memory grossly intact, but not formally assessed, fund of knowledge is   average, attention and concentration appropriate to situation.  Judgment & Insight:   fair/partial    OARRS: No flowsheet data found.    Labs:   Complete Blood Count:  Recent Labs     03/31/20  1915 04/01/20  0544   WBC 8.0 5.1   HGB 14.9 14.4   HCT 43.4 41.1   MCV 88.0 86.1   PLT 155 146       Basic Metabolic Panel:  Recent Labs     03/31/20  1915 03/31/20  1937 04/01/20  0544   NA 134  --  132*   K 4.0  --  4.8   CL 96*  --  97*   CO2 23  --  24   BUN 7  --  8   MG  --  2.2 2.2   PHOS  --  4.3 4.1       Hepatic Panel:  Recent Labs     03/31/20  1915 04/01/20  0544   AST 174* 146*   ALT 106* 88*   ALBUMIN 4.5 3.9   3.9        UDS available for review: Yes; Positive for:   []  benzodiazepines, []  barbiturates, []  cocaine, []  THC, []  amphetamines,   []  methamphetamines, []  opiates, []  oxycodone, [x]  fentanyl, []  methadone,  []  buprenorphine, []  MDMA, []  PCP    Imaging:

## 2020-04-01 NOTE — Unmapped (Signed)
[facility] PRS followed up with Pt regarding linkage. Pt states they would be willing to return to [facility] upon discharge if no other options were confirmed. Pt encouraged to call the [facility] and complete phone assessment between the hours of 8am-4pm. This writer provided the Pt with admissions number to be able to complete this requirement. SUD Team will continue to follow.      Robie Ridge, PRS  Linkage Coordinator/Peer Recovery Specialist  [facility] ([facility]) - SUD Team  Dept. of Emergency Medicine  [phone]

## 2020-04-01 NOTE — Unmapped (Signed)
[place]  Care Management/Social Work Assessment      Patient Information     Hospital Day: 0  Inpatient/Observation: Observation  Admit Date: 03/31/2020  Admission Diagnosis: No admission diagnoses are documented for this encounter.  Attending provider: Dolores Frame, MD    PCP: Herbert Pun, MD  Home Pharmacy:              [facility] [address], [place] - [address]  [address]  [place] [address]  Phone: [phone]     [facility]  [address] [place] [address]  Phone: [phone]     [facility] [address] [place], [place] - [address]  [address] [place] [address]  Phone: [phone]     [facility] [address] - [place], [place] - [address]  [address] [place] [address]  Phone: [phone]     [facility] [address] [place], [place] - [address] [facility] [address]  [address]  [place] [address]  Phone: [phone]     [facility]  [address]  [address] [place] [address]  Phone: [phone]        Pertinent Medications  Anticoagulation therapy: Yes  Anticoagulant (Name of Drug): heparin (porcine) injection 5,000 Units  New Diabetic: No      Issues related to obtaining medications: N/A    Payor Information   Medical Insurance Coverage:  Payor: MOLINA / Plan: MOLINA / Product Type: Medicaid Mngd care /   Secondary Payor: N/A    Functional Assessment   Functional Assessment  Assessment Information Obtained From:: Patient  How do you wish to be addressed?: Alexander [name]  Current Mental Status: Awake, Oriented to Person, Oriented to Place, Oriented to Time, Oriented to Situation  Mental Status Prior to Admission: Unable to Assess  Previous Self Harm/Suicidal Ideation: Yes  Suicide Attempts: Yes (comment)  Suicide History Comments: way in the past  Activities of Daily Living: Partial Assistance Needed  ADL Comments: Says he needs assistance with cleaning  Work History: Unemployed  Marital Status:  Single  Number of children and their names: NA  Relative Search Completed: No  Demographics Correct:: Yes  Discharge Destination: Other (Comment)(TBD)    Current Living Arrangements   Current Living Arrangements  Current Living Arrangements: Home  Type of Housing: Apartment  Who do you live with?: Alone  One Story or Two (check all that apply): Multi-Level  Enter the number of steps and rails to enter the residence: 30STE  History of Falls?: Yes  Frequency of Falls: Not too frequent    Electrical engineer at Home: Not Applicable    Support Systems   Emergency contact: Extended Emergency Contact Information  Primary Emergency Contact: Alexander [name]  Address: No address given   [place]  Home Phone: [phone]  Mobile Phone: [phone]  Relation: Sister  Preferred language: English  Interpreter needed? No  Secondary Emergency Contact: Alexander [name]  Address: no address given   [place]  Home Phone: [phone]  Relation: Mother  Preferred language: English  Interpreter needed? No    Support Systems  Legal Status: N/A  Primary Caregiver: Self  Times of available support: No 24/7 hands on available  Marital Status: Single  Number of children and their names: NA  Relative Search Completed: No  Demographics Correct:: Yes  Discharge Destination: Other (Comment)(TBD)  Next of Kin: Alexander [name]  Next of Kin Relationship:  Mother  Next of Kin Phone Number: [phone]  Assessment Information Obtained From:: Patient    Other Pertinent Information     Per H&P: Alexander [name] is a 34 y.o. male with a history of HIV, HCV, PTSD, presenting to the hospital with alcohol withdrawal. SW attempted to meet with the pt in the AM to complete assessment, but pt stated he was not feeling well and asked SW to return later. SW met with pt again at bedside. Pt was laying down with his face turned towards the wall. Pt confirmed that the address on file  is correct. He advised he has a phone but does not know the number. Pt reports that he lives alone in a three story apartment building. He reports that there are 30STE his home. Pt advised that he is mostly independent, but could use help with cleaning. He advised that he has a hx of falls but they are not too frequent. He reports that he does not have access to 24 hour supervision.     Pt denies any DME, oxygen, dialysis, HHC, or SNF/IPR use and placement. Pt reports that he has been to [facility] before for SUD tx and is willing to go again. He also reports having a CM through [employer]. Pt reports hx of PTSD and bipolar disorder; said he had a psychiatrist in the past. Pt reports suicidal ideations/attempts way in the past. Pt reports use of the following substances: fentanyl, 24 oz beer cans a day, and two packs of cigarettes a day. PT reports that [facility] has reached out to him.     Pt reports that he is not currently working, and did not report any financial concerns. He also advised that he does not have transportation home and will need a ride.     Discharge Plan     Care Coordinator/Social Worker met with H5 team for daily rounds on patient. Patient is not medically ready for discharge at this time due to needing to be monitored for alcohol withdrawal. MD advised that pt will be MR in 2-3 days and addictions will see pt.      SW will continue to follow patient, communicate with H5 team, and assist with discharge planning.     Barriers to Discharge  Barriers to Discharge: Lack of support    Anticipated Discharge Plan: TBD    Anticipated Discharge Date: 2-3 days     Anticipated Transportation: Medicaid taxi     Patient/Family aware and taking part in the discharge plan.  Patient/family educated that once post-acute care needs have been identified, a provider list applicable to the identified post-acute care needs as well as the insurance provider will be provided, and patient/family have the freedom to choose  their provider(s); financial interest(s) are disclosed as appropriate.    Theophilus Bones, MSSA, LSW  Inpatient Social Work Care Coordinator   Phone Number: [phone]

## 2020-04-01 NOTE — Unmapped (Signed)
Ready and clean bed assigned to [id number]. Pt updated on plan of care including transfer and is agreeable. Receiving RN may call [phone] to consult ED RN with questions regarding patients care. Pt is leaving the department in stable condition with all personal items in possession.   Ordered medications that have been received from Pharmacy will not be tubed to receiving unit.  The patient does not have a patient monitor at bedside in the ED.  Pt was swabbed for COVID 19 and is considered Low risk.   Most recent vitals: BP (!) 139/94    Pulse 67    Temp 98.9 ??F (37.2 ??C)    Resp 16    SpO2 98%     Marylou Flesher ,RN

## 2020-04-01 NOTE — Unmapped (Addendum)
[facility] PRS approached Pt to follow up on previous linkage discussion and assess current needs. Pt is currently admitted for medical needs. Linkage prevented at this time. Peer support was offered and accepted. Pt states they would not be interested in returning to [facility] at this time. Pt interested in exploring additional inpatient treatment options. This Clinical research associate will continue to follow up and re-engage in linkage once applicable or requested.      Robie Ridge, PRS  Linkage Coordinator/Peer Recovery Specialist  Early Intervention Program ([facility]IP) - SUD Team  Dept. of Emergency Medicine  [phone]

## 2020-04-01 NOTE — ED Notes (Signed)
Admit team at bedside

## 2020-04-02 LAB — RENAL FUNCTION PANEL W/EGFR
Albumin: 3.7 g/dL (ref 3.5–5.7)
Anion Gap: 12 mmol/L (ref 3–16)
BUN: 8 mg/dL (ref 7–25)
CO2: 24 mmol/L (ref 21–33)
Calcium: 9.2 mg/dL (ref 8.6–10.3)
Chloride: 94 mmol/L — ABNORMAL LOW (ref 98–110)
Creatinine: 0.71 mg/dL (ref 0.60–1.30)
Glucose: 91 mg/dL (ref 70–100)
Osmolality, Calculated: 268 mosm/kg — ABNORMAL LOW (ref 278–305)
Phosphorus: 3 mg/dL (ref 2.1–4.7)
Potassium: 3.6 mmol/L (ref 3.5–5.3)
Sodium: 130 mmol/L — ABNORMAL LOW (ref 133–146)
eGFR AA CKD-EPI: >90 See note.
eGFR NONAA CKD-EPI: >90 See note.

## 2020-04-02 LAB — PROTIME-INR
INR: 1 (ref 0.9–1.1)
Protime: 13.7 s (ref 12.1–15.1)

## 2020-04-02 LAB — HEPATIC FUNCTION PANEL
ALT: 78 U/L (ref 7–52)
AST: 88 U/L (ref 13–39)
Albumin: 3.7 g/dL (ref 3.5–5.7)
Alkaline Phosphatase: 83 U/L (ref 36–125)
Bilirubin, Direct: 0.26 mg/dL (ref 0.00–0.40)
Bilirubin, Indirect: 0.54 mg/dL (ref 0.00–1.10)
Total Bilirubin: 0.8 mg/dL (ref 0.0–1.5)
Total Protein: 7.1 g/dL (ref 6.4–8.9)

## 2020-04-02 LAB — MAGNESIUM: Magnesium: 1.7 mg/dL (ref 1.5–2.5)

## 2020-04-02 MED ORDER — magnesium sulfate in D5W 100 mL 1 gram/100 mL IVPB 1 g
1 | INTRAVENOUS | Status: AC
Start: 2020-04-02 — End: 2020-04-02
  Administered 2020-04-02: 19:00:00 1 g via INTRAVENOUS

## 2020-04-02 MED ORDER — potassium chloride (KLOR-CON M20) CR tablet 40 mEq
20 | Freq: Once | ORAL | Status: AC
Start: 2020-04-02 — End: 2020-04-02
  Administered 2020-04-02: 23:00:00 40 meq via ORAL

## 2020-04-02 MED ORDER — lactated Ringers infusion
INTRAVENOUS | Status: AC
Start: 2020-04-02 — End: 2020-04-03
  Administered 2020-04-02: 19:00:00 75 mL/h via INTRAVENOUS

## 2020-04-02 MED ORDER — magnesium sulfate in D5W 100 mL 1 gram/100 mL IVPB 1 g
1 | INTRAVENOUS | Status: AC
Start: 2020-04-02 — End: 2020-04-02
  Administered 2020-04-02: 21:00:00 1 g via INTRAVENOUS

## 2020-04-02 MED FILL — LORAZEPAM 2 MG/ML INJECTION SOLUTION: 2 2 mg/mL | INTRAMUSCULAR | Qty: 1

## 2020-04-02 MED FILL — DIAZEPAM 5 MG/ML INJECTION SYRINGE: 5 5 mg/mL | INTRAMUSCULAR | Qty: 2

## 2020-04-02 MED FILL — MAGNESIUM SULFATE 1 GRAM/100 ML IN DEXTROSE 5 % INTRAVENOUS PIGGYBACK: 1 1 gram/100 mL | INTRAVENOUS | Qty: 100

## 2020-04-02 MED FILL — HEPARIN (PORCINE) 5,000 UNIT/ML INJECTION SOLUTION: 5000 5,000 unit/mL | INTRAMUSCULAR | Qty: 1

## 2020-04-02 MED FILL — THERA 400 MCG TABLET: 400 400 mcg | ORAL | Qty: 1

## 2020-04-02 MED FILL — ONDANSETRON HCL (PF) 4 MG/2 ML INJECTION SOLUTION: 4 4 mg/2 mL | INTRAMUSCULAR | Qty: 2

## 2020-04-02 MED FILL — VITAMIN B-1 (MONONITRATE) 100 MG TABLET: 100 100 mg | ORAL | Qty: 1

## 2020-04-02 MED FILL — FOLIC ACID 1 MG TABLET: 1 1 MG | ORAL | Qty: 1

## 2020-04-02 MED FILL — IBUPROFEN 400 MG TABLET: 400 400 MG | ORAL | Qty: 1

## 2020-04-02 MED FILL — TRIUMEQ 600 MG-50 MG-300 MG TABLET: 600-50-300 600-50-300 mg | ORAL | Qty: 1

## 2020-04-02 MED FILL — LORAZEPAM 2 MG/ML INJECTION SOLUTION: 2 2 mg/mL | INTRAMUSCULAR | Qty: 2

## 2020-04-02 MED FILL — KLOR-CON M20 MEQ TABLET,EXTENDED RELEASE: 20 20 mEq | ORAL | Qty: 2

## 2020-04-02 MED FILL — NICOTINE 21 MG/24 HR DAILY TRANSDERMAL PATCH: 21 21 mg/24 hr | TRANSDERMAL | Qty: 1

## 2020-04-02 MED FILL — DIAZEPAM 5 MG TABLET: 5 5 MG | ORAL | Qty: 2

## 2020-04-02 NOTE — Unmapped (Addendum)
[facility]  Case Management/Social Work Department  Progress Note    Patient Information     Hospital day: 0  Inpatient/Observation:  Observation   Level of Care:  H5  Admit date:  03/31/2020  Admission diagnosis: Alcohol withdrawal syndrome without complication (CMS Dx) [F10.230]    PMH:  has a past medical history of Anxiety, Bipolar disorder (CMS Dx), Bronchitis, Depression, Drug abuse and dependence (CMS Dx), H/O tooth extraction, Hepatitis C, HIV infection (CMS Dx), Liver disease (Hep C), PTSD (post-traumatic stress disorder), Substance induced mood disorder (CMS Dx), and Thyroid disease.    PCP:  Herbert Pun, MD    Home Pharmacy:    [facility] [address] [place] - [address]  [address]  [place] [address]  Phone: [phone]     [facility]  [address] [place] [address]  Phone: [phone]     [facility] [address], [place] - [address]  [address] [place] [address]  Phone: [phone]     [facility] [address] - [place], [place] - [address]  [address] [place] [address]  Phone: [phone]     [facility] [address] [place], [place] - [address] [facility] [address] [facility] [address]  [address]  [place] [address]  Phone: [phone]     [facility]  [address]  [address] [place] [address]  Phone: [phone]         Medical Insurance Coverage:  Payor: Luther Redo / Plan: MOLINA / Product Type: Medicaid Mngd care /     Other Pertinent Information     Care Coordinator/Social Worker met with H5 team for daily rounds on patient. MD advised that they want to ensure that pt is stable to discharge which may be in 2-3 days. MD advised that pt will not de-tox while inpatient. SW called Reuel Boom from [facility] [phone] who advised that pt might be able to admit on Saturday but has to confirm. Reuel Boom called back and advised that pt could admit on Saturday (if he is d/c ready), but the latest he could be at facility is 2PM  with his discharge paperwork (they do not admit on Sunday). Reuel Boom advised he will speak to pt again to confirm that he will go. SW notified MD.     SW will continue to follow patient, communicate with H5 team, and assist with discharge planning.     Discharge Plan     Anticipated discharge plan:  SUD inpatient treatment      Anticipated discharge date:  2-3 days      CM/SW will continue to follow and remain available for discharge planning needs.      Theophilus Bones, MSSA, LSW  Inpatient Social Work Care Coordinator   Phone Number: [phone]

## 2020-04-02 NOTE — Unmapped (Signed)
Nursing Overnight Progress Note    Significant Events During Shift  Pt requiring ativan per CIWA scale. Pt resting comfortably     Patient/Family Concerns  Evening/Overnight Visitors: none  Concerns: none    Assessment  Nursing time demands: high  IV access: has IV access, adequate and functioning  Sitter requirements:  no    Mental Status  No data found.    Calls to Physician Team  No data found.    Medications  [phone] - Medications Not Given  (last 12 hrs)         ** SITE UNKNOWN **       Medication Name Action Time Action Reason Comments     heparin (porcine) injection 5,000 Units 04/01/20 1834 Not Given Patient/family refused      heparin (porcine) injection 5,000 Units 04/01/20 2304 Not Given Patient/family refused                 Diet/Meals Consumed (past 24 hours)  No data found.

## 2020-04-02 NOTE — Unmapped (Signed)
ADDICTION CONSULT, FOLLOW-UP PROGRESS NOTE    ASSESSMENT AND PLAN: 34 y.o.yo male admitted to on 03/31/2020 seen by addiction consult team for here for supervised withdrawal alcohol and opiates - hx of - withdrawal seizures - wants treatment and rehab    Opioid use disorder, severe; opioid withdrawal - pt previously involved with methadone clinic, but left treatment due to issues with being able to get into a residential treatment program in order to treat his alcohol use while on methadone. Pt not interested in returning. Was open to buprenorphine when seen in ER for detox purposes, but now wanting to hold off if possible to see if he can detox without.   -Can consider use of buprenorphine for detox if pt requesting, but currently not interested.   -As pt preferring not to utilize buprenorphine due to risk of precipitated withdrawal and not wanting this med long term, note that benzodiazepines used for alcohol withdrawal should also greatly decrease and potentially help to treatment opioid withdrawal as well, but may require higher benzodiazepine doses.   ??  Alcohol use disorder, severe; alcohol withdrawal - Pt with h/o complicated withdrawal. This withdrawal and it's measurement is further complicated by co-occurring opioid withdrawal.   -Agree with CIWA triggered benzodiazepine dosing for alcohol withdrawal.   -Agree with baseline use of benzodiazepine. If there are concerns about patient's liver function (pt reports h/o significant liver disease), then would consider switching to non-hepatically metabolized benzodiazepine rather than diazepam.   -Agree with thiamine  ??  Pt reports interest in getting into residential treatment. Robie Ridge from [facility] has met with patient to discuss connecting to programs. Currently [facility] vs [facility].     Will continue to follow    Chrys Racer, MD      XAIDEN FLEIG [id number]     Date/time of admission: 03/31/2020  6:27 PM Length of Stay: 0  Time spent with  patient:21min    Subjective: Pt reports doing poorly still. He reports that he feels lightheaded when he stands and that he feels like his heart is racing in his chest. He reports that he has been afraid to eat anything due to nausea and because he is unaccustomed to eating (this answer somewhat unclear). Reports that he does ok with liquids and would like some boost shakes if possible. He reports feeling his withdrawal is being managed ok. He is uncertain about buprenorphine now stating that he is uncertain that he needs it for withdrawal management and doesn't really want to be on it long term.       PMH/SH/FH reviewed and changes noted  ROS Pt reports tremor, lack of appetite, abd pains, feeling lightheaded with standing, palpitations.     Current Medications ordered:  ??? abacavir-dolutegravir-lamivud  1 tablet Oral Daily 0900   ??? diazePAM  10 mg Oral TID    Or   ??? diazePAM  10 mg Intravenous TID   ??? folic acid  1 mg Oral Daily 0900    And   ??? multivitamin with folic acid  1 tablet Oral Daily 0900    And   ??? thiamine  100 mg Oral Daily 0900   ??? heparin  5,000 Units Subcutaneous 3 times per day   ??? magnesium sulfate in D5W 100 mL  1 g Intravenous Q1H SCH   ??? nicotine  1 patch Transdermal Daily 0900   ??? potassium chloride ER  40 mEq Oral Once     acetaminophen, cloNIDine HCL, ibuprofen, loperamide, LORazepam **OR** LORazepam,  nicotine **AND** nicotine polacrilex, ondansetron **OR** ondansetron    OBJECTIVE:  Vitals:  Marland Kitchen  Vitals:    04/02/20 0308 04/02/20 0752 04/02/20 1141 04/02/20 1500   BP: (!) 152/95 (!) 164/101 (!) 162/98 (!) 161/95   BP Location: Left arm Left arm Left arm Left arm   Patient Position: Lying Lying Lying Lying   Pulse: 90 110 88 66   Resp: 16 18 20 20    Temp: 97.7 ??F (36.5 ??C) 97.3 ??F (36.3 ??C) 97.8 ??F (36.6 ??C) 98 ??F (36.7 ??C)   TempSrc: Oral Oral Oral Oral   SpO2: 99% 100% 99% 99%   Weight:       Height:           Mental Status Exam:   Gait/Station: Pt with somewhat wide based gait and slightly  unsteady (not formally walked, but noted to be up out of bed when interviewer entered room)  Gen/Behav: WD. Appears approx stated age dressed in jeans with no shirt. Hair is disheveled. Calm and cooperative with assessment. Makes appropriate eye contact. Hands are tremulous, but no leg tremors noted with knees pulled up in bed.   Speech: nl rate, volume and production.   Mood/Affect: anxious/constricted  Thought process:  linear and goal directed, no FOI, no LOA  Thought content: denies SI/HI. No delusions voiced  Perceptions: denies A/V hallucinations  Cognition: A&Ox4, recent and remote memory grossly intact, fund of knowledge is average, attention and concentration appropriate to situation.  Judgment & Insight:   Fair/partial

## 2020-04-02 NOTE — Unmapped (Signed)
Problem: Acute Pain  Description: Patient's pain progressing toward patient's stated pain goal  Goal: Patient displays improved well-being such as baseline levels for pulse, BP, respirations and relaxed muscle tone or body posture  Outcome: Progressing     Problem: Knowledge Deficit  Goal: Patient/family/caregiver demonstrates understanding of disease process, treatment plan, medications, and discharge instructions  Description: Complete learning assessment and assess knowledge base.  Outcome: Progressing     Problem: Potential for Compromised Skin Integrity  Goal: Skin integrity is maintained or improved  Description: Assess and monitor skin integrity. Identify patients at risk for skin breakdown on admission and per policy. Collaborate with interdisciplinary team and initiate plans and interventions as needed.  Outcome: Progressing  Goal: Nutritional status is improving  Description: Monitor and assess patient for malnutrition (ex- brittle hair, bruises, dry skin, pale skin and conjunctiva, muscle wasting, smooth red tongue, and disorientation). Collaborate with interdisciplinary team and initiate plan and interventions as ordered.  Monitor patient's weight and dietary intake as ordered or per policy. Utilize nutrition screening tool and intervene per policy. Determine patient's food preferences and provide high-protein, high-caloric foods as appropriate.   Outcome: Progressing     Problem: Incontinence  Goal: Perineal skin integrity is maintained or improved  Description: Assess genitourinary system, perineal skin, labs (urinalysis), and history of incontinence to include past management, aggravating, and alleviating factors.  Collaborate with interdisciplinary team and initiate plans and interventions as needed.  Outcome: Progressing     Problem: Knowledge Deficit  Goal: Patient/family/caregiver demonstrates understanding of disease process, treatment plan, medications, and discharge instructions  Description:  Complete learning assessment and assess knowledge base.  Outcome: Progressing     Problem: Fall Prevention  Goal: Patient will remain free of falls  Description: Assess and monitor vitals signs, neurological status including level of consciousness and orientation.  Reassess fall risk per hospital policy.    Ensure arm band on, uncluttered walking paths in room, adequate room lighting, call light and overbed table within reach, bed in low position, wheels locked, side rails up per policy (excluding SNF), and non-skid footwear provided.   Outcome: Progressing

## 2020-04-02 NOTE — Unmapped (Signed)
Hospital Medicine Progress Note  [place] // [facility]      Chief Complaint / Reason for Follow-Up     Alexander [name] is a 34 y.o. male on hospital day 0.  The principal reason for today's follow up visit is Alcohol withdrawal (CMS Dx).      Interval History     Still continues to be nauseaus; has poor appetite. Symptoms seem to be controlled for now with Benzos used for alcohol withdrawal.  BP on the higher side. Rest of the hemodynamics seem to be stable      Review of Systems (focused)   Nausea, poor appetite, myalgias. Rest of the symptoms reviewed and was negative.      Medications     Scheduled Meds:  ??? abacavir-dolutegravir-lamivud  1 tablet Oral Daily 0900   ??? diazePAM  10 mg Oral TID    Or   ??? diazePAM  10 mg Intravenous TID   ??? folic acid  1 mg Oral Daily 0900    And   ??? multivitamin with folic acid  1 tablet Oral Daily 0900    And   ??? thiamine  100 mg Oral Daily 0900   ??? heparin  5,000 Units Subcutaneous 3 times per day   ??? nicotine  1 patch Transdermal Daily 0900     Continuous Infusions:  ??? lactated Ringers 75 mL/hr (04/02/20 1520)     PRN Meds:acetaminophen, cloNIDine HCL, ibuprofen, loperamide, LORazepam **OR** LORazepam, nicotine **AND** nicotine polacrilex, ondansetron **OR** ondansetron      Vital Signs     Temp:  [97.3 ??F (36.3 ??C)-98.1 ??F (36.7 ??C)] 98.1 ??F (36.7 ??C)  Heart Rate:  [66-110] 91  Resp:  [16-20] 18  BP: (146-164)/(95-107) 146/106    Intake/Output Summary (Last 24 hours) at 04/02/2020 2250  Last data filed at 04/02/2020 1300  Gross per 24 hour   Intake 500 ml   Output ???   Net 500 ml         Physical Exam     Gen - alert, no distress  Eyes - normal conjunctiva, normal pupils  ENT - moist mucosa, no OP erythema or exudates  Neck - supple, no thyromegaly  Lymph - no adenopathy cervical or supraclavicular  CV - RRR, no MRG, no elevated JVP, no edema  Lung - CTA, normal WOB  Abd - +BS, soft nt/nd, no organomegaly  MSK - no clubbing, no cyanosis, no joint swelling, no muscle  tenderness  Skin - normal temp, no rashes  Neuro - alert, oriented to self/place/time/situation, no facial asymmetry, no gross focal weakness, sensation grossly intact to light touch  Psych - normal mood, normal behavior      Laboratory Data         Lab 04/01/20  0544 03/31/20  1915   WBC 5.1 8.0   HEMOGLOBIN 14.4 14.9   HEMATOCRIT 41.1 43.4   MEAN CORPUSCULAR VOLUME 86.1 88.0   PLATELETS 146 155           Lab 04/02/20  0747 04/01/20  0544 03/31/20  1915   SODIUM 130* 132* 134   POTASSIUM 3.6 4.8 4.0   CHLORIDE 94* 97* 96*   CO2 24 24 23    BUN 8 8 7    CREATININE 0.71 0.59* 0.73   GLUCOSE 91 91 134*           Lab 04/02/20  0747 04/01/20  0544 03/31/20  1937 03/31/20  1915   CALCIUM 9.2 9.3  --  9.6   MAGNESIUM 1.7 2.2 2.2  --    PHOSPHORUS 3.0 4.1 4.3  --            Lab 04/02/20  0747   INR 1.0   PROTHROMBIN TIME 13.7           Lab 04/02/20  0747 04/01/20  0544 03/31/20  1915   ALT 78* 88* 106*   AST 88* 146* 174*   ALK PHOS 83 86 102   BILIRUBIN TOTAL 0.8 0.9 0.6   BILIRUBIN DIRECT 0.26 0.2 0.1   ALBUMIN 3.7 3.9 4.5   ALBUMINKID 3.7 3.9  --            Lab 03/31/20  2139   COLOR, URINE Yellow   CLARITY Clear   SPECIFIC GRAVITY, URINE 1.015   PH UA 6.0   PROTEIN UA Negative   GLUCOSE UA Negative   KETONES UA Negative   BILIRUBIN UA Negative   BLOOD UA Negative   NITRITE UA Negative   UROBILINOGEN UA 0.2 E.U./dL   LEUKOCYTES UA Negative             No results found for: NTPROBNP    Lab Results   Component Value Date    TSH 1.21 10/17/2017    FREET4 1.08 11/14/2013                 Diagnostic Studies     No results found.    The images associated with the above reports were personally reviewed.      Assessment & Plan     Alexander [name] is a 34 y.o. male on hospital day 0.  The medical issues being addressed in today's encounter are as follows:    Principal Problem:    Alcohol withdrawal (CMS Dx)  Active Problems:    Bipolar I disorder, most recent episode depressed (CMS Dx)    Polysubstance abuse (CMS Dx)    HIV  infection (CMS Dx)    Chronic hepatitis C without hepatic coma (CMS Dx)    Elevated liver enzymes  # Alcohol use disorder, presenting with mild alcohol withdrawal. At risk for severe alcohol withdrawal, given hx of alcohol withdrawal seizures  # HIV infection  # Untreated hepatitis C  # Polysubstance abuse (noted fentanyl positive on UDS)  # elevated transaminases, likely 2/2 alcohol abuse, and some contribution from hepatitis C as well  # HTN, likely a manifestation of alcohol withdrawal.   -scheduled valium 10 mg tid, along with CIWA protocol and supportive treatments with thiamine and folic acid. Noted mild elevation of liver enzymes; so will monitor as the patient is on scheduled valium. Involve Child psychotherapist in providing resources  -hold off on starting buprenorphine for now, as patient is significantly concerned about risk of precipitating withdrawal. Patient is more inclined for residential detox program. Appreciate help of addiction medicine in this process.  -continue home Triumeg for his HIV  -outpatient hepatology follow up for hx of untreated Hep C.  Hold off on RUQ [place] for now  -gently hydration with LR 75 cc/hr for next 12 hrs , given poor oral intake and increased insensible loss  -HTN is likely to resolve with treatment of alcohol withdrawal    Diet:   Diet Orders          Diet regular starting at 04/27 2345        Code Status: Full Code    Medical Readiness  Pt. has Reached Medical Readiness for Discharge:  No  Expected Medical Readiness Date: 2-3 Days  Medically Appropriate for [facility] (This item is for Internal Medicine Service use only): No       Dolores Frame, MD  Section of Hospital Medicine  Department of Internal Medicine  Pager ID [phone]  10:50 PM, 04/02/2020

## 2020-04-03 LAB — RENAL FUNCTION PANEL W/EGFR
Albumin: 3.7 g/dL (ref 3.5–5.7)
Anion Gap: 10 mmol/L (ref 3–16)
BUN: 5 mg/dL — ABNORMAL LOW (ref 7–25)
CO2: 27 mmol/L (ref 21–33)
Calcium: 8.9 mg/dL (ref 8.6–10.3)
Chloride: 96 mmol/L — ABNORMAL LOW (ref 98–110)
Creatinine: 0.59 mg/dL — ABNORMAL LOW (ref 0.60–1.30)
Glucose: 114 mg/dL — ABNORMAL HIGH (ref 70–100)
Osmolality, Calculated: 274 mosm/kg — ABNORMAL LOW (ref 278–305)
Phosphorus: 2.4 mg/dL (ref 2.1–4.7)
Potassium: 3.4 mmol/L — ABNORMAL LOW (ref 3.5–5.3)
Sodium: 133 mmol/L (ref 133–146)
eGFR AA CKD-EPI: >90 See note.
eGFR NONAA CKD-EPI: >90 See note.

## 2020-04-03 LAB — HEPATIC FUNCTION PANEL
ALT: 72 U/L — ABNORMAL HIGH (ref 7–52)
AST: 73 U/L — ABNORMAL HIGH (ref 13–39)
Albumin: 3.7 g/dL (ref 3.5–5.7)
Alkaline Phosphatase: 76 U/L (ref 36–125)
Bilirubin, Direct: 0.18 mg/dL (ref 0.00–0.40)
Bilirubin, Indirect: 0.52 mg/dL (ref 0.00–1.10)
Total Bilirubin: 0.7 mg/dL (ref 0.0–1.5)
Total Protein: 6.9 g/dL (ref 6.4–8.9)

## 2020-04-03 LAB — MAGNESIUM: Magnesium: 1.9 mg/dL (ref 1.5–2.5)

## 2020-04-03 MED ORDER — potassium chloride (KLOR-CON M20) CR tablet 20 mEq
20 | Freq: Once | ORAL | Status: AC
Start: 2020-04-03 — End: 2020-04-03
  Administered 2020-04-03: 18:00:00 20 meq via ORAL

## 2020-04-03 MED ORDER — QUEtiapine (SEROQUEL) tablet 50 mg
50 | Freq: Every evening | ORAL | Status: AC
Start: 2020-04-03 — End: 2020-04-05
  Administered 2020-04-04: 04:00:00 50 mg via ORAL

## 2020-04-03 MED ORDER — potassium chloride (KLOR-CON M20) CR tablet 40 mEq
20 | Freq: Once | ORAL | Status: AC
Start: 2020-04-03 — End: 2020-04-03
  Administered 2020-04-03: 16:00:00 40 meq via ORAL

## 2020-04-03 MED ORDER — lactated Ringers infusion
INTRAVENOUS | Status: AC
Start: 2020-04-03 — End: 2020-04-04
  Administered 2020-04-03: 16:00:00 75 mL/h via INTRAVENOUS

## 2020-04-03 MED FILL — HEPARIN (PORCINE) 5,000 UNIT/ML INJECTION SOLUTION: 5000 5,000 unit/mL | INTRAMUSCULAR | Qty: 1

## 2020-04-03 MED FILL — KLOR-CON M20 MEQ TABLET,EXTENDED RELEASE: 20 20 mEq | ORAL | Qty: 1

## 2020-04-03 MED FILL — FOLIC ACID 1 MG TABLET: 1 1 MG | ORAL | Qty: 1

## 2020-04-03 MED FILL — TRIUMEQ 600 MG-50 MG-300 MG TABLET: 600-50-300 600-50-300 mg | ORAL | Qty: 1

## 2020-04-03 MED FILL — ONDANSETRON HCL 4 MG TABLET: 4 4 MG | ORAL | Qty: 1

## 2020-04-03 MED FILL — NICOTINE 21 MG/24 HR DAILY TRANSDERMAL PATCH: 21 21 mg/24 hr | TRANSDERMAL | Qty: 1

## 2020-04-03 MED FILL — CLONIDINE HCL 0.1 MG TABLET: 0.1 0.1 MG | ORAL | Qty: 1

## 2020-04-03 MED FILL — TYLENOL 325 MG TABLET: 325 325 mg | ORAL | Qty: 2

## 2020-04-03 MED FILL — VITAMIN B-1 (MONONITRATE) 100 MG TABLET: 100 100 mg | ORAL | Qty: 1

## 2020-04-03 MED FILL — LORAZEPAM 2 MG/ML INJECTION SOLUTION: 2 2 mg/mL | INTRAMUSCULAR | Qty: 1

## 2020-04-03 MED FILL — SEROQUEL 50 MG TABLET: 50 50 mg | ORAL | Qty: 1

## 2020-04-03 MED FILL — DIAZEPAM 5 MG TABLET: 5 5 MG | ORAL | Qty: 2

## 2020-04-03 MED FILL — LORAZEPAM 2 MG/ML INJECTION SOLUTION: 2 2 mg/mL | INTRAMUSCULAR | Qty: 2

## 2020-04-03 MED FILL — THERA 400 MCG TABLET: 400 400 mcg | ORAL | Qty: 1

## 2020-04-03 NOTE — Unmapped (Addendum)
[facility]  Case Management/Social Work Department  Progress Note    Patient Information     Hospital day: 1  Inpatient/Observation:  Inpatient   Level of Care:  Med surge  Admit date:  03/31/2020  Admission diagnosis: Alcohol withdrawal syndrome without complication (CMS Dx) [F10.230]    PMH:  has a past medical history of Anxiety, Bipolar disorder (CMS Dx), Bronchitis, Depression, Drug abuse and dependence (CMS Dx), H/O tooth extraction, Hepatitis C, HIV infection (CMS Dx), Liver disease (Hep C), PTSD (post-traumatic stress disorder), Substance induced mood disorder (CMS Dx), and Thyroid disease.    PCP:  Herbert Pun, MD    Home Pharmacy:    [facility] [address] [place] - [address]  [address]  [place] [address]  Phone: [phone]     [facility]  [address] [place] [address]  Phone: [phone]     [facility] [address], [place] - [address]  [address] [place] [address]  Phone: [phone]     [facility] [address] - [place], [place] - [address]  [address] [place] [address]  Phone: [phone]     [facility] [address] [place], [place] - [address] AT [facility] [address]  [address] [place] [address]  Phone: [phone]     [facility]  [address]  [address] [place] [address]  Phone: [phone]         Medical Insurance Coverage:  Payor: Luther Redo / Plan: MOLINA / Product Type: Medicaid [place] care /     Other Pertinent Information     SW spoke to [place] with [employer] [phone]) who reports patient is suppose to call [facility] to and intake. Patient also can return to [facility] if needed. Per team rounds, patient still requires a lot of medication and will likely not be ready until Sunday. Patient is unable to admit to a treatment facility on a Sunday, therefore will remain in hospital until Monday. SW met with patient at bedside. Patient reports needing Daniel's number as he lost the card  and the number for [facility] again. SW provided both. Patient reports he will call the [facility] now. SW updated Reuel Boom on DC date and patient plans to call [facility] now. Daniel to follow up with patient later.     SW got ROI from patient and faxed over some information to [facility] at [phone]    Discharge Plan     Anticipated discharge plan:  SUD inpatient treatment      Anticipated discharge date:  5/6     CM/SW will continue to follow and remain available for discharge planning needs.      Hartford Poli, MSW, LSW     Cell [phone]

## 2020-04-03 NOTE — Unmapped (Signed)
Nursing Overnight Progress Note    Significant Events During Shift  Pt requiring ativan frequently per CIWA. MD is aware. Pt was able to eat a [place] sandwich overnight. New IV placed due to old one getting occluded.     Patient/Family Concerns  Evening/Overnight Visitors: none  Concerns: none    Assessment  Nursing time demands: moderate  IV access: has IV access, adequate and functioning  Sitter requirements:  no    Mental Status  No data found.    Calls to Physician Team  No data found.    Medications  [phone] - Medications Not Given  (last 12 hrs)         ** SITE UNKNOWN **       Medication Name Action Time Action Reason Comments     heparin (porcine) injection 5,000 Units 04/02/20 2308 Not Given Patient/family refused                 Diet/Meals Consumed (past 24 hours)  Nutrition Assessment for the past 24 hrs:   Diet Type Feeding Percent Meals Eaten (%) Appetite   04/02/20 1030 Regular Able to feed self 10 % Fair

## 2020-04-03 NOTE — Unmapped (Signed)
Department of Internal Medicine  Daily Progress Note      Chief Complaint / Reason for Follow-Up     BYRANT VALENT is a 34 y.o. male on hospital day 1. The principal reason for today's follow up visit is Alcohol withdrawal (CMS Dx).    Interval History / Subjective     Seen this AM, patient in active withdrawal and symptomatic. Patient with visible diaphoresis and tremors. He is eager to try to eat this morning and complains of dehydration. Reports this withdrawal is worse than prior.    CIWAs most recently 13-17, has received 28mg  ativan past 24 hours, as well as scheduled valium.      Review of Systems (Focused)     +abd pain, +nausea      Medications     Scheduled Meds:  ??? abacavir-dolutegravir-lamivud  1 tablet Oral Daily 0900   ??? diazePAM  10 mg Oral TID    Or   ??? diazePAM  10 mg Intravenous TID   ??? heparin  5,000 Units Subcutaneous 3 times per day   ??? multivitamin with folic acid  1 tablet Oral Daily 0900   ??? nicotine  1 patch Transdermal Daily 0900   ??? potassium chloride  40 mEq Oral Once    Followed by   ??? potassium chloride  20 mEq Oral Once     Continuous Infusions:  ??? lactated Ringers       PRN Meds:  acetaminophen, cloNIDine HCL, ibuprofen, loperamide, LORazepam **OR** LORazepam, nicotine **AND** nicotine polacrilex, ondansetron **OR** ondansetron       Vital Signs     Temp:  [97.8 ??F (36.6 ??C)-98.4 ??F (36.9 ??C)] 98.4 ??F (36.9 ??C)  Heart Rate:  [66-91] 85  Resp:  [16-20] 16  BP: (146-172)/(89-107) 156/89    Intake/Output Summary (Last 24 hours) at 04/03/2020 0939  Last data filed at 04/02/2020 1300  Gross per 24 hour   Intake 500 ml   Output ???   Net 500 ml         Physical Exam     General: Alert, oriented, cooperative, diaphoretic, tremulous  HENT: Normocephalic, atraumatic, MMM  Eyes: EOMI intact, conjunctiva/sclera clear  CV: RRR, s1 s2 nl, no m/g/r, pulses palpable bilaterally  Resp: CTAB, no incr WOB  GI: soft, non-tender abd, +BS throughout all 4 quadrants  MSK: no joint swelling, full ROM in all  joints  Neuro: AAOx3, moves all extremities spontaneously  Skin: clean, dry, intact. No rash     Laboratory Data       CBC 04/01/2020                 \\    14.4   /         5.1  \\______/ 146                 /            \\                             /   41.1    \\ Differential 03/31/2020    N 70.7 L 16.5 M 12.0 E 0.4 B 0.4      Renal 04/03/2020       133       96         5    /    ___ __ _____ ______/ 114                                       \\  3.4         27       0.59   \\  Ca     8.9 (04/03/2020)  Mg     1.9 (04/03/2020)  Phos 2.4 (04/03/2020) Lipids    Lab Results   Component Value Date    CHOLTOT 132 01/27/2020    TRIG 89 01/27/2020    HDL 40 (L) 01/27/2020    LDL 74 01/27/2020      LFTs 04/03/2020               \\      0.7      /              \\    0.18    /         73  \\______/  72               /            \\              /    76      \\ PT/INR/PTT - 04/02/2020    PT  13.7 INR  1.0 PTT  No results found for requested labs within last 16109 hours.                  Lab 03/31/20  2139   COLOR, URINE Yellow   CLARITY Clear   SPECIFIC GRAVITY, URINE 1.015   PH UA 6.0   PROTEIN UA Negative   GLUCOSE UA Negative   KETONES UA Negative   BILIRUBIN UA Negative   BLOOD UA Negative   NITRITE UA Negative   UROBILINOGEN UA 0.2 E.U./dL   LEUKOCYTES UA Negative             No results found for: NTPROBNP    Lab Results   Component Value Date    TSH 1.21 10/17/2017    FREET4 1.08 11/14/2013             Diagnostic Studies     No new      Assessment & Plan     ROSA GAMBALE is a 34 y.o. male on HD# 1 with Alcohol withdrawal (CMS Dx).  The medical issues being addressed in today's encounter are as follows:    Principal Problem:    Alcohol withdrawal (CMS Dx)  Active Problems:    Bipolar I disorder, most recent episode depressed (CMS Dx)    Polysubstance abuse (CMS Dx)    HIV infection (CMS Dx)    Chronic hepatitis C without hepatic coma (CMS Dx)    Elevated liver enzymes      #alcohol use disorder  Drinking 24-30 beers a day, last drink  was reportedly 4/27, hx complicated withdrawal with seizures. Presenting for monitored withdrawal and outpatient rehab setup.  -CIWAs: most recently in the mid teens  -valium 10 tid, ativan 2-4mg  prn  -low threshold for escalation of care for gtt if needed  -addiction consulted, appreciate recs    #opiate use disorder  IV fentanyl; last use 4/27  -COWS protocol, PRNs available  -addiction consulted, appreciate recs  -at this time, does not want buprenorphine; can continue to discuss maintenance meds as withdrawal progresses    #tobacco use disorder  -nicoderm scheduled    #HTN  manifestation of alcohol withdrawal, no intervention at this time as BP not severely elevated    #HIV  Restarted home triumeq    #  Untreated HepC    #elevated transaminases  Likely from either active alcohol use or HepC; trending down so favor alcohol use  -continue to monitor    Nutrition:  Diet Orders          Diet regular starting at 04/27 2345          Code Status: Full Code   Dvt: subq hep    Signed:  Cruzita Lederer, MD  Hospitalist 5 Intern  Pager: [phone]  04/03/2020, 9:39 AM

## 2020-04-04 LAB — RENAL FUNCTION PANEL W/EGFR
Albumin: 3.9 g/dL (ref 3.5–5.7)
Anion Gap: 6 mmol/L (ref 3–16)
BUN: 7 mg/dL (ref 7–25)
CO2: 28 mmol/L (ref 21–33)
Calcium: 9.6 mg/dL (ref 8.6–10.3)
Chloride: 103 mmol/L (ref 98–110)
Creatinine: 0.76 mg/dL (ref 0.60–1.30)
Glucose: 97 mg/dL (ref 70–100)
Osmolality, Calculated: 282 mOsm/kg (ref 278–305)
Phosphorus: 3.3 mg/dL (ref 2.1–4.7)
Potassium: 4 mmol/L (ref 3.5–5.3)
Sodium: 137 mmol/L (ref 133–146)
eGFR AA CKD-EPI: >90 See note.
eGFR NONAA CKD-EPI: >90 See note.

## 2020-04-04 LAB — MAGNESIUM: Magnesium: 1.9 mg/dL (ref 1.5–2.5)

## 2020-04-04 MED ORDER — cloNIDine HCL (CATAPRES) tablet 0.1 mg
0.1 | Freq: Two times a day (BID) | ORAL | Status: AC | PRN
Start: 2020-04-04 — End: 2020-04-05

## 2020-04-04 MED ORDER — naloxone (NARCAN) injection 0.4 mg
0.4 | INTRAMUSCULAR | Status: AC | PRN
Start: 2020-04-04 — End: 2020-04-05

## 2020-04-04 MED ORDER — buprenorphine HCL (SUBUTEX) SL tablet 4 mg
2 | Freq: Once | SUBLINGUAL | Status: AC | PRN
Start: 2020-04-04 — End: 2020-04-05

## 2020-04-04 MED ORDER — diazePAM (VALIUM) Syrg 5 mg
5 | Freq: Three times a day (TID) | INTRAMUSCULAR | Status: AC
Start: 2020-04-04 — End: 2020-04-05

## 2020-04-04 MED ORDER — famotidine (PEPCID) tablet 20 mg
20 | Freq: Two times a day (BID) | ORAL | Status: AC
Start: 2020-04-04 — End: 2020-04-05
  Administered 2020-04-04: 17:00:00 20 mg via ORAL

## 2020-04-04 MED ORDER — loperamide (IMODIUM) capsule 2 mg
2 | ORAL | Status: AC | PRN
Start: 2020-04-04 — End: 2020-04-05

## 2020-04-04 MED ORDER — diazePAM (VALIUM) tablet 5 mg
5 | Freq: Three times a day (TID) | ORAL | Status: AC
Start: 2020-04-04 — End: 2020-04-05
  Administered 2020-04-04: 17:00:00 5 mg via ORAL

## 2020-04-04 MED ORDER — ibuprofen (MOTRIN) tablet 400 mg
400 | ORAL | Status: AC | PRN
Start: 2020-04-04 — End: 2020-04-05

## 2020-04-04 MED ORDER — ondansetron (ZOFRAN) tablet 4 mg
4 | Freq: Four times a day (QID) | ORAL | Status: AC | PRN
Start: 2020-04-04 — End: 2020-04-05

## 2020-04-04 MED FILL — THERA 400 MCG TABLET: 400 400 mcg | ORAL | Qty: 1

## 2020-04-04 MED FILL — SEROQUEL 50 MG TABLET: 50 50 mg | ORAL | Qty: 1

## 2020-04-04 MED FILL — NICOTINE 21 MG/24 HR DAILY TRANSDERMAL PATCH: 21 21 mg/24 hr | TRANSDERMAL | Qty: 1

## 2020-04-04 MED FILL — DIAZEPAM 5 MG TABLET: 5 5 MG | ORAL | Qty: 1

## 2020-04-04 MED FILL — LORAZEPAM 2 MG/ML INJECTION SOLUTION: 2 2 mg/mL | INTRAMUSCULAR | Qty: 1

## 2020-04-04 MED FILL — ONDANSETRON HCL 4 MG TABLET: 4 4 MG | ORAL | Qty: 1

## 2020-04-04 MED FILL — HEPARIN (PORCINE) 5,000 UNIT/ML INJECTION SOLUTION: 5000 5,000 unit/mL | INTRAMUSCULAR | Qty: 1

## 2020-04-04 MED FILL — NICOTINE (POLACRILEX) 2 MG GUM: 2 2 mg | BUCCAL | Qty: 2

## 2020-04-04 MED FILL — DIAZEPAM 5 MG TABLET: 5 5 MG | ORAL | Qty: 2

## 2020-04-04 MED FILL — TRIUMEQ 600 MG-50 MG-300 MG TABLET: 600-50-300 600-50-300 mg | ORAL | Qty: 1

## 2020-04-04 MED FILL — LOPERAMIDE 2 MG CAPSULE: 2 2 mg | ORAL | Qty: 2

## 2020-04-04 MED FILL — FAMOTIDINE 20 MG TABLET: 20 20 MG | ORAL | Qty: 1

## 2020-04-04 NOTE — Unmapped (Signed)
Patient stated that he wanted to start buprenorphine today. Paged team who was agreeable to beginning treatment today. Explained to the patient potential undesirable effects when starting buprenorphine at this stage in his withdrawal. Patient stated he would like to begin on morning of 04/05/20.    Patient later left abruptly at 19:00, but was discharged by medicine team.    Estill Batten, RN

## 2020-04-04 NOTE — Unmapped (Signed)
At approximately 18:45, the patient said he wanted to leave immediately.  He stated that he had a friend come in from out of town unexpectedly who had no place to stay. Patient wanted to leave to get his friend to let him stay at his house.  Nurse paged team.  Patient is alert & oriented x 4 with no signs of delirium.  Team placed order to discharge patient.              Patient states that he still intends to admit himself to an inpatient substance rehab program next week.     AVS completed and given to patient.  RN went over DC instructions regarding follow-up care and medications.  Pt verbalized understanding and signed one copy to be placed in paper chart.      RN assisted pt in gathering personal belongings.  Peripheral IV removed per order and site covered with gauze and tape.      CMU notified that pt DC from monitor; monitor removed and placed in appropriate bin.      Pt left unit in good condition with all personal belongings.  Pt headed to main entrance on foot. RN escorted patient to elevator.    Estill Batten, RN

## 2020-04-04 NOTE — Unmapped (Signed)
Department of Internal Medicine  Daily Progress Note      Chief Complaint / Reason for Follow-Up     Alexander [name] is a 34 y.o. male on hospital day 2. The principal reason for today's follow up visit is Alcohol withdrawal (CMS Dx).    Interval events / Brief Plan     - No acute events overnight   - CIWA trending down; last at 9  - able to tolerate a full meal  - expresses hope and thanks    Brief Plan:  - valium 5mg  TID 5/1; 5mg  BID 5/2; 5mg  once 5/3  - continue ativan PRNs and CIWA  - pepcid for abdominal pain      Review of Systems (Focused)   + abdominal pain  + some sensory distortion/hallucination  + tremors  + nausea; appetite improved from yesterday      Medications     Scheduled Meds:  ??? abacavir-dolutegravir-lamivud  1 tablet Oral Daily 0900   ??? diazePAM  5 mg Oral TID    Or   ??? diazePAM  5 mg Intravenous TID   ??? famotidine  20 mg Oral BID   ??? heparin  5,000 Units Subcutaneous 3 times per day   ??? multivitamin with folic acid  1 tablet Oral Daily 0900   ??? nicotine  1 patch Transdermal Daily 0900   ??? quetiapine  50 mg Oral Nightly (2100)     Continuous Infusions:  PRN Meds:  acetaminophen, cloNIDine HCL, ibuprofen, loperamide, LORazepam **OR** LORazepam, nicotine **AND** nicotine polacrilex, ondansetron **OR** ondansetron       Vital Signs     Temp:  [97.9 ??F (36.6 ??C)-98.8 ??F (37.1 ??C)] 97.9 ??F (36.6 ??C)  Heart Rate:  [74-94] 74  Resp:  [16-20] 18  BP: (116-149)/(75-100) 138/96    Intake/Output Summary (Last 24 hours) at 04/04/2020 1205  Last data filed at 04/03/2020 1508  Gross per 24 hour   Intake 356 ml   Output ???   Net 356 ml         Physical Exam     General: Alert, oriented, cooperative, mildly tremulous  HENT: Normocephalic, atraumatic, MMM  Eyes: EOMI intact, conjunctiva/sclera clear  CV: RRR, s1 s2 nl, no m/g/r, pulses palpable bilaterally  Resp: CTAB, no incr WOB  GI: soft, mild tenderness on RUQ, +BS throughout all 4 quadrants  MSK: no joint swelling, full ROM in all joints  Neuro: AAOx3, moves  all extremities spontaneously  Skin: clean, dry, intact. No rash     Laboratory Data         Lab 04/04/20  0727 04/03/20  0656 04/02/20  0747 04/01/20  0544   SODIUM 137 133 130* 132*   POTASSIUM 4.0 3.4* 3.6 4.8   CHLORIDE 103 96* 94* 97*   CO2 28 27 24 24    BUN 7 5* 8 8   CREATININE 0.76 0.59* 0.71 0.59*   GLUCOSE 97 114* 91 91   MAGNESIUM 1.9 1.9 1.7 2.2   PHOSPHORUS 3.3 2.4 3.0 4.1   CALCIUM 9.6 8.9 9.2 9.3           Lab 04/04/20  0727 04/03/20  0656 04/02/20  0747 04/01/20  0544 03/31/20  1915 03/31/20  1915   TOTAL PROTEIN  --  6.9 7.1 7.5  --  8.5   ALBUMIN  --  3.7 3.7 3.9  --  4.5   ALBUMINKID 3.9 3.7 3.7 3.9   < >  --  BILIRUBIN TOTAL  --  0.7 0.8 0.9  --  0.6   ALK PHOS  --  76 83 86  --  102   AST  --  73* 88* 146*  --  174*   ALT  --  72* 78* 88*  --  106*    < > = values in this interval not displayed.       Diagnostic Studies     Reviewed    Assessment & Plan     Alexander [name] is a 34 y.o. male on HD# 2 with Alcohol withdrawal (CMS Dx).  The medical issues being addressed in today's encounter are as follows:    Principal Problem:    Alcohol withdrawal (CMS Dx)  Active Problems:    Bipolar I disorder, most recent episode depressed (CMS Dx)    Polysubstance abuse (CMS Dx)    HIV infection (CMS Dx)    Chronic hepatitis C without hepatic coma (CMS Dx)    Elevated liver enzymes    #alcohol use disorder  Drinking 24-30 beers a day, last drink was reportedly 4/27, hx complicated withdrawal with seizures. Presenting for monitored withdrawal and outpatient rehab setup.  -CIWAs: most recently score of 7-9  -valium 5 tid 5/1, then bid 5/2, and once 5/3, ativan 2-4mg  prn  -low threshold for escalation of care for gtt if needed  -addiction consulted, appreciate recs  ??  #opiate use disorder  IV fentanyl; last use 4/27  -COWS protocol, PRNs available  -addiction consulted, appreciate recs  -at this time, does not want buprenorphine; can continue to discuss maintenance meds as withdrawal progresses  ??  #tobacco  use disorder  -nicoderm scheduled  ??  #HTN  manifestation of alcohol withdrawal, no intervention at this time as BP not severely elevated  ??  #HIV  Cont home triumeq  ??  #Untreated HepC  ??  #elevated transaminases  Likely from either active alcohol use or HepC; downtrending so favor alcohol use  -continue to monitor    Nutrition:  Diet Orders          Diet regular starting at 04/27 2345          Code Status: Full Code    This note was copied forward from the day prior.  Pertinent edits and updates were made where appropriate.    Signed:  Ranae Palms, MD, PGY1  04/04/2020, 12:05 PM

## 2020-04-04 NOTE — Unmapped (Signed)
[facility]  Department of Internal Medicine  Inpatient Discharge Summary    Patient: Alexander [name]   MRN: [id number]     Date of Admission: 03/31/2020  Date of Discharge: 04/04/2020  Attending Physician: No att. providers found   Primary Care Physician: Herbert Pun, MD    Diagnoses Present on Admission     Past Medical History:   Diagnosis Date   ??? Anxiety    ??? Bipolar disorder (CMS Dx)    ??? Bronchitis    ??? Depression    ??? Drug abuse and dependence (CMS Dx)    ??? H/O tooth extraction    ??? Hepatitis C    ??? HIV infection (CMS Dx)    ??? Liver disease Hep C   ??? PTSD (post-traumatic stress disorder)    ??? Substance induced mood disorder (CMS Dx)    ??? Thyroid disease         Discharge Diagnoses     Active Hospital Problems    Diagnosis Date Noted   ??? Alcohol withdrawal (CMS Dx) [F10.239] 02/01/2020   ??? Elevated liver enzymes [R74.8] 10/19/2017   ??? Chronic hepatitis C without hepatic coma (CMS Dx) [B18.2] 05/17/2017   ??? HIV infection (CMS Dx) [B20] 03/07/2017   ??? Polysubstance abuse (CMS Dx) [F19.10] 12/11/2013   ??? Bipolar I disorder, most recent episode depressed (CMS Dx) [F31.30] 11/15/2013      Resolved Hospital Problems   No resolved problems to display.     Patient left Against Medical Advice    Consulting Services     IP CONSULT TO ADDICTION SERVICES    Allergies     No Known Allergies    Discharge Medications     Patient left Against Medical Advice       Medication List      TAKE these medications, which you were ALREADY TAKING      Quantity/Refills   Triumeq 600-50-300 mg Tab  Generic drug: abacavir-dolutegravir-lamivud  TAKE ONE TABLET BY MOUTH DAILY   Quantity: 30 tablet  Refills: 1          Reason for Admission     Alexander [name] is a 34 y.o. male with a history of alcohol use disorder, HIV infection, untreated Hep C, polysubstance use disorder (noted fentanyl positive on UDS), elevated transaminases, HTN, who presented to the hospital for alcohol withdrawal.    Hospital Course By Problem        Patient left Against Medical Advice.  Plan was to assist patient in selecting a residential detox center while managing withdrawal symptoms.  Addiction Services was consulted.  Patient left on 04/04/2020 before these goals were completed.      #alcohol use disorder  #alcohol withdrawal  Drinks 24-30 beers a day, last drink was reportedly 4/27, hx complicated withdrawal with seizures. Presenting for monitored withdrawal and outpatient rehab setup.  -CIWAs improved over his stay: most recently score of 7-9  -had scheduled valium: most recently valium 5 tid 5/1, then bid 5/2, and once 5/3, ativan 2-4mg  prn  -low threshold for escalation of care for gtt if needed  -addiction consulted, appreciate recs  ??  #opiate use disorder  IV fentanyl; last use 4/27  -COWS protocol, PRNs available  -addiction consulted, appreciate recs  -at this time, does not want buprenorphine; can continue to discuss maintenance meds as withdrawal progresses  ??  #tobacco use disorder  -nicoderm scheduled  ??  #HTN  manifestation of alcohol withdrawal, no intervention  at this time as BP not severely elevated  ??  #HIV  Cont home triumeq  ??  #Untreated HepC  ??  #elevated transaminases  Likely from either active alcohol use or HepC; downtrending so favor alcohol use  -continue to monitor    Patient left Against Medical Advice.      Discharge Physical Exam     BP (!) 134/97 (BP Location: Left arm, Patient Position: Sitting) Comment: rn NOTIFIED   Pulse 79    Temp 98.8 ??F (37.1 ??C) (Oral)    Resp 18    Ht 5' 9 (1.753 m)    Wt 150 lb (68 kg)    SpO2 100%    BMI 22.15 kg/m??      Patient left Against Medical Advice    Operations/Procedures Performed     Surgeries/procedures:     Lines/Drains/Airways:  Patient Lines/Drains/Airways Status    Active Line / PIV Line     None         NHSN Device Days (03/06/2020 to 04/04/2020)     Central line: 0    Urinary catheter: 0              Notable Imaging Studies:  No orders to display       Condition on Discharge      1. Functional Status: Mildly impaired due to medical comorbidities as described above    2. Mental Status: A&Ox3    3. Dietary Restrictions / Tube Feeding / TPN: No    4. Supplemental Oxygen / Ventilation / CPAP / BiLevel: No.     5. In-dwelling lines or catheters: No.    Core Measure Documentation (As Applicable)     Most Recent Wt: Weight: 150 lb (68 kg)  Disposition     Patient left Against Medical Advice.      Follow-Up Appointments     No future appointments.    Herbert Pun, MD  [address] [place] [address]  [phone]             Patient Instructions / Follow-Up Items for Receiving Physician     1. Patient left Against Medical Advice.          Signed:  Jencarlo Bonadonna  04/05/2020, 12:09 PM

## 2020-04-04 NOTE — Unmapped (Signed)
This pt was free of falls. Call light within reach, non skid socks on, bed alarm on, continue to monitor.

## 2020-04-10 ENCOUNTER — Inpatient Hospital Stay
Admission: EM | Admit: 2020-04-10 | Discharge: 2020-04-12 | Discharge status: Against Medical Advice | Payer: PRIVATE HEALTH INSURANCE

## 2020-04-10 ENCOUNTER — Inpatient Hospital Stay: Admit: 2020-04-11 | Payer: PRIVATE HEALTH INSURANCE

## 2020-04-10 DIAGNOSIS — F10239 Alcohol dependence with withdrawal, unspecified: Secondary | ICD-10-CM

## 2020-04-10 MED ORDER — lactated Ringers 1,000 mL IV fluid
Freq: Once | INTRAVENOUS | Status: AC
Start: 2020-04-10 — End: 2020-04-10
  Administered 2020-04-11: via INTRAVENOUS

## 2020-04-10 MED ORDER — thiamine mononitrate (VITAMIN B-1) tablet 100 mg
100 | Freq: Every day | ORAL | Status: AC
Start: 2020-04-10 — End: 2020-04-12
  Administered 2020-04-11 – 2020-04-12 (×2): 100 mg via ORAL

## 2020-04-10 MED ORDER — thiamine mononitrate (VITAMIN B-1) tablet 100 mg
100 | Freq: Every day | ORAL | Status: AC
Start: 2020-04-10 — End: 2020-04-10

## 2020-04-10 MED ORDER — enoxaparin (LOVENOX) for prophylaxis syringe 40 mg/0.4 mL
40 | Freq: Every day | SUBCUTANEOUS | Status: AC
Start: 2020-04-10 — End: 2020-04-12
  Administered 2020-04-11 – 2020-04-12 (×2): 40 mg via SUBCUTANEOUS

## 2020-04-10 MED ORDER — multivitamin with folic acid tablet 1 tablet
400 | Freq: Every day | ORAL | Status: AC
Start: 2020-04-10 — End: 2020-04-12
  Administered 2020-04-11 – 2020-04-12 (×2): 1 via ORAL

## 2020-04-10 MED ORDER — diazePAM (VALIUM) tablet 10 mg
5 | Freq: Once | ORAL | Status: AC
Start: 2020-04-10 — End: 2020-04-10
  Administered 2020-04-11: 10 mg via ORAL

## 2020-04-10 MED ORDER — LORazepam (ATIVAN) tablet 1 mg
1 | ORAL | Status: AC | PRN
Start: 2020-04-10 — End: 2020-04-12
  Administered 2020-04-11 – 2020-04-12 (×8): 1 mg via ORAL

## 2020-04-10 MED ORDER — LORazepam (ATIVAN) tablet 2 mg
1 | ORAL | Status: AC | PRN
Start: 2020-04-10 — End: 2020-04-12
  Administered 2020-04-11 – 2020-04-12 (×3): 2 mg via ORAL

## 2020-04-10 MED ORDER — multivitamin with folic acid tablet 1 tablet
400 | Freq: Every day | ORAL | Status: AC
Start: 2020-04-10 — End: 2020-04-10

## 2020-04-10 MED ORDER — diazePAM (VALIUM) tablet 10 mg
5 | Freq: Four times a day (QID) | ORAL | Status: AC
Start: 2020-04-10 — End: 2020-04-11
  Administered 2020-04-11 (×2): 10 mg via ORAL

## 2020-04-10 MED ORDER — LORazepam (ATIVAN) tablet 1 mg
1 | ORAL | Status: AC | PRN
Start: 2020-04-10 — End: 2020-04-10

## 2020-04-10 MED ORDER — diazePAM (VALIUM) Syrg 10 mg
5 | Freq: Once | INTRAMUSCULAR | Status: AC
Start: 2020-04-10 — End: 2020-04-10

## 2020-04-10 MED ORDER — LORazepam (ATIVAN) tablet 2 mg
1 | ORAL | Status: AC | PRN
Start: 2020-04-10 — End: 2020-04-10

## 2020-04-10 MED ORDER — abacavir-dolutegravir-lamivud (TRIUMEQ) 600-50-300 mg tablet 1 tablet
600-50-300 | Freq: Every day | ORAL | Status: AC
Start: 2020-04-10 — End: 2020-04-12
  Administered 2020-04-11 – 2020-04-12 (×2): 1 via ORAL

## 2020-04-10 MED ORDER — diazePAM (VALIUM) tablet 10 mg
5 | Freq: Once | ORAL | Status: AC
Start: 2020-04-10 — End: 2020-04-10

## 2020-04-10 MED ORDER — folic acid (FOLVITE) tablet 1 mg
1 | Freq: Every day | ORAL | Status: AC
Start: 2020-04-10 — End: 2020-04-12
  Administered 2020-04-11 – 2020-04-12 (×2): 1 mg via ORAL

## 2020-04-10 MED FILL — DIAZEPAM 5 MG TABLET: 5 5 MG | ORAL | Qty: 2

## 2020-04-10 MED FILL — LORAZEPAM 1 MG TABLET: 1 1 MG | ORAL | Qty: 1

## 2020-04-10 NOTE — Unmapped (Signed)
[facility] ED Note    Date of Service: 04/10/2020  Reason for Visit: Abdominal Pain and Withdrawal-Alcohol      Patient History     HPI  Alexander [name] is a 34 y.o. male with a past medical history of HIV infection, untreated Hep C, polysubstance use disorder (noted fentanyl positive on UDS), elevated transaminases, HTN presenting to the ED for evaluation of etoh withdrawal.     Patient recently seen in the emergency department admitted for alcohol withdrawal.  Patient was here for few days and was planning to go to a rehab facility for detox but had to leave to take care of things at home.  Patient has since attempted to quit alcohol but continues have withdrawal symptoms.  Patient drinks 24-30 beers a day.  His last drink was at 9 AM this morning.  He has had seizures from withdrawal before.  He also admits to use of IV fentanyl.  Patient reports that he feels sweaty, jittery, anxious, has had nausea and vomiting, has generalized pain.  He denies any focal chest or back pain, no skin changes, no areas of redness or swelling.    Past Medical History:   Diagnosis Date   ??? Anxiety    ??? Bipolar disorder (CMS Dx)    ??? Bronchitis    ??? Depression    ??? Drug abuse and dependence (CMS Dx)    ??? H/O tooth extraction    ??? Hepatitis C    ??? HIV infection (CMS Dx)    ??? Liver disease Hep C   ??? PTSD (post-traumatic stress disorder)    ??? Substance induced mood disorder (CMS Dx)    ??? Thyroid disease      Past Surgical History:   Procedure Laterality Date   ??? MANDIBLE SURGERY       Patient  reports that he has been smoking cigarettes. He has a 11.00 pack-year smoking history. He has never used smokeless tobacco. He reports current alcohol use. He reports current drug use. Drugs: Heroin, Other-see comments, Marijuana, and Cocaine.  Previous Medications    FOLIC ACID (FOLVITE) 1 MG TABLET        TRIUMEQ 600-50-300 MG TAB    TAKE ONE TABLET BY MOUTH DAILY       Allergies:   Allergies as of 04/10/2020   ??? (No Known Allergies)       All  nursing notes and triage notes were appropriately reviewed in the course of the creation of this note.     Review of Systems     Review of Systems   Constitutional: Positive for chills, diaphoresis and malaise/fatigue. Negative for fever.   Eyes: Negative for blurred vision and double vision.   Respiratory: Negative for cough, shortness of breath and wheezing.    Cardiovascular: Negative for chest pain, palpitations and leg swelling.   Gastrointestinal: Positive for abdominal pain, nausea and vomiting. Negative for diarrhea.   Genitourinary: Negative for dysuria, frequency, hematuria and urgency.   Neurological: Negative for dizziness and headaches.   Psychiatric/Behavioral: The patient is nervous/anxious and has insomnia.      A full review of systems was performed for this patient and is otherwise negative.     Physical Exam     Vitals:    04/10/20 1916 04/10/20 2124   BP: 148/86 106/53   BP Location: Right arm    Patient Position: Sitting    Pulse: 92 90   Resp: 18 13   Temp: 98.3 ??F (  36.8 ??C)    TempSrc: Oral    SpO2: 96% 92%       General:  Appears uncomfortable. But non toxic  Eyes:  Pupils reactive. No discharge from eyes   ENT:  No discharge from nose. OP clear. No fasciculations  Neck:  Supple, trachea midline  Pulmonary:   Non-labored breathing. Breath sounds clear bilaterally  Cardiac:  Tachycardia with regular rhythm. No murmurs  Abdomen:  Soft. Non-tender. Non-distended  Musculoskeletal:  No long bone deformity.   Vascular:  Extremities warm and perfused. Normal pulses in all 4 extremities  Skin:  Dry, no rashes. No stigmata of endocarditis. No areas of erythema or swelling.   Extremities:  No peripheral edema  Neuro:  Alert. Moves all four extremities to command. Tremors    Diagnostic Studies     Labs:  See EMR    Radiology:  See EMR      ED Course and MDM     Alexander [name] is a 34 y.o. male with a history and presentation as described above in HPI.  The patient was evaluated by myself and the ED  Attending Physician, Dr. Urbano Heir, and R4 Dr. Kizzie Bane. All management and disposition plans were discussed and agreed upon.    Upon presentation, the patient appeared to be experiencing withdrawal sx. he was tachycardic, diaphoretic, and appeared uncomfortable with tremors. CIWA 14.  Patient otherwise with no other complaints, no chest, back pain or any stigmata of endocarditis, and no skin findings concerning for cellulitis or abscess.  Giving significant symptoms of withdrawal, and patient very eager to stop drinking, with history of severe withdrawal in the past including seizures, we will admit patient for further evaluation. Given valium, fluids and rally pack.        Medications received during this ED visit:  Medications   folic acid (FOLVITE) tablet 1 mg (has no administration in time range)     And   multivitamin with folic acid tablet 1 tablet (has no administration in time range)     And   thiamine mononitrate (VITAMIN B-1) tablet 100 mg (has no administration in time range)   LORazepam (ATIVAN) tablet 1 mg (has no administration in time range)     Or   LORazepam (ATIVAN) tablet 2 mg (has no administration in time range)   diazePAM (VALIUM) tablet 10 mg (10 mg Oral Given 04/10/20 2011)     Or   diazePAM (VALIUM) Syrg 10 mg ( Intravenous See Alternative 04/10/20 2011)   lactated Ringers 1,000 mL IV fluid ( Intravenous New Bag 04/10/20 2011)       At this time, the patient has been admitted to medicine for further evaluation and management of alcohol withdarwal. The patient will continue to be monitored here in the emergency department until which time he is moved to his new treatment location.    Patient verbalizes understanding and agreement with the above treatment plan.     Impression     1. Alcohol withdrawal syndrome without complication (CMS Dx)         Laurence Spates, MD, PGY1  [facility]       Laurence Spates, MD  Resident  04/10/20 [phone]

## 2020-04-10 NOTE — Unmapped (Signed)
[facility] SUD Team spoke with patient regarding linkage needs. Pt is well known to this team. Patient reports he is hoping to get medical clearance in order to [facility]. Pt also reports spitting up blood. Pt given contact card for assistance with linkage to [facility] ([facility] intake hours are M-F 8-4p).       Zenaida Deed  Intake Coordinator  [facility] Program   [phone]

## 2020-04-10 NOTE — ED Triage Notes (Signed)
Pt to ED with abdominal pain and one episode of bloody emesis. Pt states he usually drinks 24+ beers a day. Pt states he last had 3 beers between 0800-1000 and feels like he is going into withdrawal. Pt appears diaphoretic. Tachycardic to 120s per EMS.

## 2020-04-10 NOTE — Unmapped (Signed)
ED Attending Attestation Note    Date of service:  04/10/2020    This patient was seen by the resident physician.  I have seen and examined the patient, agree with the workup, evaluation, management and diagnosis. The care plan has been discussed and I concur.     My assessment reveals a 34 y.o. male who presents due to alcohol withdrawal.  The patient reports he drinks approximately 24 beers a day, although he has been trying to cut down, so he only drank 6 beers today.  He now feels like he is going into withdrawal.  He also reports some mild epigastric abdominal discomfort that he says has been present for the past 2 weeks after he drank an entire bottle of whiskey.  On exam, he is diaphoretic and tremulous.  We will treat for alcohol withdrawal.  No significant abdominal tenderness on exam.  No history of cirrhosis.    Jules Husbands, MD

## 2020-04-10 NOTE — Unmapped (Signed)
Department of Internal Medicine  History & Physical    Patient: Alexander [name]  MRN: [id number]  CSN: [id number]    Chief Complaint     Withdrawal, coughed up blood    History of Present Illness     Alexander [name] is a 34 y.o. male with a history of HIV on triumeq, alcohol and fentanyl abuse, presenting to the hospital with alcohol withdrawal.    Patient typically drinks 12-14 8% beers daily. He was trying to taper down on his own for the last couple of days. He was drinking one beer in the morning and one at night. He began feeling withdrawal symptoms this morning: sweating, shaking, numbness of his hands. He had 3 beers this morning and continued to feel withdrawal symptoms.     He also uses fentanyl, injects in both arms. Last use evening of 5/6.     He also reports ccoughing up about a teaspoon of blood once today. Denies fever chills. Has a mild dry cough which occasionally brings up some sputum but he feels it is more post-nasal drip. He denies nosebleeds. Denies vomiting blood. Denies epigastric pain. Some heartburn occasionally.     ED Course  HDS, normal EKG. Labs sent. Given valium 10mg  PO.    Review of Systems     Review of Systems   Constitutional: Negative for chills and fever.   HENT: Positive for sore throat. Negative for congestion.    Eyes: Negative for blurred vision and double vision.   Respiratory: Positive for cough and hemoptysis. Negative for shortness of breath.    Cardiovascular: Positive for palpitations (fluttering). Negative for chest pain.   Gastrointestinal: Positive for heartburn. Negative for blood in stool and constipation.   Genitourinary: Negative for dysuria and hematuria.   Musculoskeletal: Positive for back pain.   Skin: Negative for itching.        Psoriasis on stomach   Neurological: Positive for tremors and weakness.       Past Medical History     Past Medical History:   Diagnosis Date   ??? Anxiety    ??? Bipolar disorder (CMS Dx)    ??? Bronchitis    ??? Depression    ??? Drug abuse  and dependence (CMS Dx)    ??? H/O tooth extraction    ??? Hepatitis C    ??? HIV infection (CMS Dx)    ??? Liver disease Hep C   ??? PTSD (post-traumatic stress disorder)    ??? Substance induced mood disorder (CMS Dx)    ??? Thyroid disease          Past Surgical History     Past Surgical History:   Procedure Laterality Date   ??? MANDIBLE SURGERY           Family History     Family History   Problem Relation Age of Onset   ??? Drug abuse Brother    ??? Suicidality Brother    ??? Hypertension Mother    ??? Transient ischemic attack Mother    ??? Seizures Father    ??? Hypertension Father    ??? Cancer Father    ??? Breast Cancer Father    ??? Suicidality Father    ??? Drug abuse Brother    ??? Suicidality Brother          Social History     Social History     Socioeconomic History   ??? Marital status: Single     Spouse name: Not  on file   ??? Number of children: Not on file   ??? Years of education: Not on file   ??? Highest education level: Not on file   Occupational History   ??? Not on file   Social Needs   ??? Financial resource strain: Not on file   ??? Food insecurity     Worry: Not on file     Inability: Not on file   ??? Transportation needs     Medical: Not on file     Non-medical: Not on file   Tobacco Use   ??? Smoking status: Current Every Day Smoker     Packs/day: 1.00     Years: 11.00     Pack years: 11.00     Types: Cigarettes   ??? Smokeless tobacco: Never Used   Substance and Sexual Activity   ??? Alcohol use: Yes     Comment: 10/17/17: reports drinking ~1 day per week   ??? Drug use: Yes     Types: Heroin, Other-see comments, Marijuana, Cocaine     Comment: 10/17/17: reports last illicit drug use ~1 month ago   ??? Sexual activity: Yes     Partners: Female, Male     Birth control/protection: Condom, Other-see comments   Lifestyle   ??? Physical activity     Days per week: Not on file     Minutes per session: Not on file   ??? Stress: Not on file   Relationships   ??? Social Wellsite geologist on phone: Not on file     Gets together: Not on file     Attends  religious service: Not on file     Active member of club or organization: Not on file     Attends meetings of clubs or organizations: Not on file     Relationship status: Not on file   ??? Intimate partner violence     Fear of current or ex partner: Not on file     Emotionally abused: Not on file     Physically abused: Not on file     Forced sexual activity: Not on file   Other Topics Concern   ??? Caffeine Use Yes   ??? Occupational Exposure No   ??? Exercise No   ??? Seat Belt Yes   Social History Narrative   ??? Not on file         Medications     Home Meds:  Prior to Admission medications    Medication Sig Start Date End Date Taking? Authorizing Provider   folic acid (FOLVITE) 1 MG tablet  02/26/20   Historical Provider, MD   TRIUMEQ 600-50-300 mg Tab TAKE ONE TABLET BY MOUTH DAILY 03/27/20   Curly Rim, MD          Inpatient Meds:  Scheduled:  ??? [START ON 04/11/2020] folic acid  1 mg Oral Daily 0900    And   ??? [START ON 04/11/2020] multivitamin with folic acid  1 tablet Oral Daily 0900    And   ??? [START ON 04/11/2020] thiamine  100 mg Oral Daily 0900       Continuous:      [id number] **OR** LORazepam      Vital Signs     Temp:  [98.3 ??F (36.8 ??C)] 98.3 ??F (36.8 ??C)  Heart Rate:  [90-92] 90  Resp:  [13-18] 13  BP: (106-148)/(53-86) 106/53  No intake or output data in the 24 hours ending  04/10/20 2201      Physical Exam     Physical Exam  Constitutional:       General: He is not in acute distress.     Appearance: Normal appearance.   HENT:      Head: Normocephalic and atraumatic.      Mouth/Throat:      Mouth: Mucous membranes are moist.   Eyes:      Extraocular Movements: Extraocular movements intact.      Conjunctiva/sclera: Conjunctivae normal.      Pupils: Pupils are equal, round, and reactive to light.   Neck:      Musculoskeletal: Normal range of motion and neck supple.   Cardiovascular:      Rate and Rhythm: Normal rate and regular rhythm.      Pulses: Normal pulses.      Heart sounds: Normal heart sounds. No  murmur. No gallop.    Pulmonary:      Effort: Pulmonary effort is normal. No respiratory distress.      Comments: Coarse breath sounds throughout, most prominent in b/l lower lung fields  Abdominal:      General: Bowel sounds are normal. There is no distension.      Palpations: Abdomen is soft.      Tenderness: There is no abdominal tenderness.      Comments: Lower abdomen psoriasis    Musculoskeletal:         General: No swelling or signs of injury.      Right lower leg: No edema.      Left lower leg: No edema.   Skin:     General: Skin is warm and dry.      Comments: Track marks on b/l antecubital fossae, mild erythema without drainage   Neurological:      General: No focal deficit present.      Mental Status: He is alert and oriented to person, place, and time.      Comments: No asterixis    Psychiatric:         Mood and Affect: Mood normal.         Behavior: Behavior normal.         Laboratory Data     CBC 04/01/2020                 \\    14.4   /         5.1  \\______/ 146                 /            \\                             /   41.1    \\ Differential 03/31/2020    N 70.7 L 16.5 M 12.0 E 0.4 B 0.4      Renal 04/04/2020       137       103         7    /    ___ __ _____ ______/ 97                                       \\     4.0         28  0.76   \\  Ca     9.6 (04/04/2020)  Mg     1.9 (04/04/2020)  Phos 3.3 (04/04/2020) Lipids    Lab Results   Component Value Date    CHOLTOT 132 01/27/2020    TRIG 89 01/27/2020    HDL 40 (L) 01/27/2020    LDL 74 01/27/2020      LFTs 04/03/2020               \\      0.7      /              \\    0.18    /         73  \\______/  72               /            \\              /    76      \\ PT/INR/PTT - 04/02/2020    PT  13.7 INR  1.0 PTT  No results found for requested labs within last 86400 hours.              No results found for: HIV1X2  No results found for: HIV1O2AB  No results found for: HIV1O2QUAL     Lab Results   Component Value Date    HEPAIGM Nonreactive 02/20/2017    HEPBCAB  Nonreactive 04/01/2020        No results found for: LABFUNG    Lab Results   Component Value Date    ABSCD4HELP 471.0 01/27/2020    ABSCD4HELP 301.0 08/28/2019    ABSCD4HELP 509.0 09/15/2017    HIV1COP Not Detected 01/27/2020    HIV1COP 29 09/15/2017    HIV1COP <20 04/11/2017       No components found for: CD4ABTC, HIVQUC    Lab Results   Component Value Date    HIV12ABAGN Reactive (A) 02/05/2017       CD4 Nadir:                   Invalid input(s): WBCCAST, GRANCAST          No results found for: NTPROBNP    Lab Results   Component Value Date    TSH 1.21 10/17/2017    FREET4 1.08 11/14/2013             Diagnostic Studies     CXR  IMPRESSION: No acute cardiopulmonary abnormality.    EKG  NSR 100bpm      Assessment & Plan     KONGMENG SANTORO is a 34 y.o. male being admitted to the hospital for alcohol withdrawal. Medical problems being addressed in this encounter include the following:    Principal Problem:    Alcohol withdrawal (CMS Dx)    #Alcohol use disorder  #Alcohol withdrawal  Has history of seizures with withrawal. Was trying to taper down on his own at home. Drinking 8% beers. Last drink was 5/7. Withdrawal symptoms started 5/7 AM.   - CIWA with PRN ativan   - valium taper: 10mg  q6h   - addiction services consult     #Hemoptysis   Reports teaspoon-sized amount of bright red blood coughed up once today. No recent nosebleeds. Smoking 1-2PPD, no change in cough. Given small amount, without overt infectious symptoms this could be 2/2 pneumonitis in the setting of smoking. Also consider atypical pneumonia  in the setting of HIV. CXR w/o acute abnormality. Hgb 13.5  - continue to monitor   - counsel on smoking cessation    #HIV  Last CD4 471 (01/2020), last VL undetectable (01/2020). Follows with Dr. Merilynn Finland. Current regimen: triumeq, missed 2 doses a couple weeks ago but otherwise takes as prescribed. No thrush on exam.  - CD4 count pending     #Fentanyl withdrawal  Last used 5/7. Injects antecubital fossae. Had  been going to suboxone clinic, but recently stopped. Wants to get connected to go to rehab.   - COWS  - consider addiction services consult in AM    #Untreated HepC  Slight elevation of AST/ALT    #Tobacco use disorder  - NRT  ??      Nutrition:  Diet Orders          Diet regular starting at 05/07 2218          Code Status: Full Code    Signed:  Oretha Milch, MD PGY1  04/10/2020, 10:01 PM

## 2020-04-11 LAB — BASIC METABOLIC PANEL
Anion Gap: 8 mmol/L (ref 3–16)
BUN: 8 mg/dL (ref 7–25)
CO2: 27 mmol/L (ref 21–33)
Calcium: 9 mg/dL (ref 8.6–10.3)
Chloride: 103 mmol/L (ref 98–110)
Creatinine: 0.73 mg/dL (ref 0.60–1.30)
Glucose: 98 mg/dL (ref 70–100)
Osmolality, Calculated: 284 mOsm/kg (ref 278–305)
Potassium: 4.4 mmol/L (ref 3.5–5.3)
Sodium: 138 mmol/L (ref 133–146)
eGFR AA CKD-EPI: >90 See note.
eGFR NONAA CKD-EPI: >90 See note.

## 2020-04-11 LAB — 2019 NOVEL CORONAVIRUS (COVID-19), NAA-B: SARS-CoV-2: NOT DETECTED

## 2020-04-11 LAB — HEPATIC FUNCTION PANEL
ALT: 86 U/L (ref 7–52)
ALT: 75 U/L — ABNORMAL HIGH (ref 7–52)
AST: 85 U/L (ref 13–39)
AST: 66 U/L — ABNORMAL HIGH (ref 13–39)
Albumin: 3.8 g/dL (ref 3.5–5.7)
Albumin: 3.4 g/dL — ABNORMAL LOW (ref 3.5–5.7)
Alkaline Phosphatase: 81 U/L (ref 36–125)
Alkaline Phosphatase: 64 U/L (ref 36–125)
Bilirubin, Direct: 0.1 mg/dL (ref 0.0–0.4)
Bilirubin, Direct: 0.1 mg/dL (ref 0.0–0.4)
Bilirubin, Indirect: 0.2 mg/dL (ref 0.0–1.1)
Bilirubin, Indirect: 0.2 mg/dL (ref 0.0–1.1)
Total Bilirubin: 0.3 mg/dL (ref 0.0–1.5)
Total Bilirubin: 0.3 mg/dL (ref 0.0–1.5)
Total Protein: 7 g/dL (ref 6.4–8.9)
Total Protein: 6.4 g/dL (ref 6.4–8.9)

## 2020-04-11 LAB — DIFFERENTIAL
Basophils Absolute: 51 /uL (ref 0–200)
Basophils Absolute: 60 /uL (ref 0–200)
Basophils Relative: 0.8 % (ref 0.0–1.0)
Basophils Relative: 0.9 % (ref 0.0–1.0)
Eosinophils Absolute: 211 /uL (ref 15–500)
Eosinophils Absolute: 255 /uL (ref 15–500)
Eosinophils Relative: 3.3 % (ref 0.0–8.0)
Eosinophils Relative: 3.8 % (ref 0.0–8.0)
Lymphocytes Absolute: 2093 /uL (ref 850–3900)
Lymphocytes Absolute: 1615 /uL (ref 850–3900)
Lymphocytes Relative: 32.7 % (ref 15.0–45.0)
Lymphocytes Relative: 24.1 % (ref 15.0–45.0)
Monocytes Absolute: 902 /uL (ref 200–950)
Monocytes Absolute: 824 /uL (ref 200–950)
Monocytes Relative: 14.1 % (ref 0.0–12.0)
Monocytes Relative: 12.3 % — ABNORMAL HIGH (ref 0.0–12.0)
Neutrophils Absolute: 3142 /uL (ref 1500–7800)
Neutrophils Absolute: 3946 /uL (ref 1500–7800)
Neutrophils Relative: 49.1 % (ref 40.0–80.0)
Neutrophils Relative: 58.9 % (ref 40.0–80.0)
nRBC: 0 /100 WBC (ref 0–0)
nRBC: 0 /100{WBCs} (ref 0–0)

## 2020-04-11 LAB — LYMPHOCYTE SUBSET PANEL 1
% CD 3 Pos. Lymph.: 90.1 % — ABNORMAL HIGH (ref 56.0–89.0)
% CD 4 Pos. Lymph.: 26.7 % — ABNORMAL LOW (ref 30.0–64.0)
% CD 8 Pos. Lymph.: 58.1 % — ABNORMAL HIGH (ref 6.0–43.0)
% NK (CD56): 3.2 % (ref 1.0–19.0)
Ab NK (CD56): 65 {cells}/uL (ref 24.0–460.0)
Absolute CD 3: 1788 {cells}/uL (ref 870.0–2532.0)
Absolute CD 4 Helper: 506 {cells}/uL (ref 255.0–2496.0)
Absolute CD 8 (Supp): 1102 {cells}/uL — ABNORMAL HIGH (ref 155.0–1010.0)
CD19 % B Cell: 2.8 % (ref 1.0–28.0)
CD19 Abs: 59 {cells}/uL (ref 24.0–683.0)
CD4/CD8 Ratio: 0.46 ratio — ABNORMAL LOW (ref 1.10–3.70)

## 2020-04-11 LAB — PROTIME-INR
INR: 1 (ref 0.9–1.1)
Protime: 13 seconds (ref 12.1–15.1)

## 2020-04-11 LAB — RENAL FUNCTION PANEL W/EGFR
Albumin: 3.4 g/dL — ABNORMAL LOW (ref 3.5–5.7)
Anion Gap: 8 mmol/L (ref 3–16)
BUN: 8 mg/dL (ref 7–25)
CO2: 28 mmol/L (ref 21–33)
Calcium: 8.5 mg/dL — ABNORMAL LOW (ref 8.6–10.3)
Chloride: 104 mmol/L (ref 98–110)
Creatinine: 0.72 mg/dL (ref 0.60–1.30)
Glucose: 100 mg/dL (ref 70–100)
Osmolality, Calculated: 288 mosm/kg (ref 278–305)
Phosphorus: 4.2 mg/dL (ref 2.1–4.7)
Potassium: 4.1 mmol/L (ref 3.5–5.3)
Sodium: 140 mmol/L (ref 133–146)
eGFR AA CKD-EPI: >90 See note.
eGFR NONAA CKD-EPI: >90 See note.

## 2020-04-11 LAB — URINALYSIS W/RFL TO MICROSCOPIC
Bilirubin, UA: NEGATIVE
Blood, UA: NEGATIVE
Glucose, UA: NEGATIVE mg/dL
Ketones, UA: NEGATIVE mg/dL
Leukocytes, UA: NEGATIVE
Nitrite, UA: NEGATIVE
Protein, UA: NEGATIVE mg/dL
Specific Gravity, UA: 1.01 (ref 1.005–1.035)
Urobilinogen, UA: <2 mg/dL (ref 0.2–1.9)
pH, UA: 7 (ref 5.0–8.0)

## 2020-04-11 LAB — CBC
Hematocrit: 39.8 % (ref 38.5–50.0)
Hematocrit: 39.2 % (ref 38.5–50.0)
Hemoglobin: 13.5 g/dL (ref 13.2–17.1)
Hemoglobin: 13.3 g/dL (ref 13.2–17.1)
MCH: 30.2 pg (ref 27.0–33.0)
MCH: 30 pg (ref 27.0–33.0)
MCHC: 34 g/dL (ref 32.0–36.0)
MCHC: 33.9 g/dL (ref 32.0–36.0)
MCV: 88.7 fL (ref 80.0–100.0)
MCV: 88.5 fL (ref 80.0–100.0)
MPV: 9 fL (ref 7.5–11.5)
MPV: 8.5 fL (ref 7.5–11.5)
Platelets: 251 10*3/uL (ref 140–400)
Platelets: 248 10E3/uL (ref 140–400)
RBC: 4.49 10*6/uL (ref 4.20–5.80)
RBC: 4.43 10E6/uL (ref 4.20–5.80)
RDW: 15.2 % (ref 11.0–15.0)
RDW: 15.1 % — ABNORMAL HIGH (ref 11.0–15.0)
WBC: 6.4 10*3/uL (ref 3.8–10.8)
WBC: 6.7 10E3/uL (ref 3.8–10.8)

## 2020-04-11 LAB — MAGNESIUM: Magnesium: 2 mg/dL (ref 1.5–2.5)

## 2020-04-11 MED ORDER — diazePAM (VALIUM) tablet 10 mg
10 | Freq: Three times a day (TID) | ORAL | Status: AC
Start: 2020-04-11 — End: 2020-04-12
  Administered 2020-04-11 – 2020-04-12 (×3): 10 mg via ORAL

## 2020-04-11 MED FILL — LORAZEPAM 1 MG TABLET: 1 1 MG | ORAL | Qty: 1

## 2020-04-11 MED FILL — LORAZEPAM 1 MG TABLET: 1 1 MG | ORAL | Qty: 2

## 2020-04-11 MED FILL — THERA 400 MCG TABLET: 400 400 mcg | ORAL | Qty: 1

## 2020-04-11 MED FILL — TRIUMEQ 600 MG-50 MG-300 MG TABLET: 600-50-300 600-50-300 mg | ORAL | Qty: 1

## 2020-04-11 MED FILL — DIAZEPAM 10 MG TABLET: 10 10 MG | ORAL | Qty: 1

## 2020-04-11 MED FILL — DIAZEPAM 5 MG TABLET: 5 5 MG | ORAL | Qty: 2

## 2020-04-11 MED FILL — ENOXAPARIN 40 MG/0.4 ML SUBCUTANEOUS SYRINGE: 40 40 mg/0.4 mL | SUBCUTANEOUS | Qty: 0.4

## 2020-04-11 MED FILL — FOLIC ACID 1 MG TABLET: 1 1 MG | ORAL | Qty: 1

## 2020-04-11 MED FILL — VITAMIN B-1 (MONONITRATE) 100 MG TABLET: 100 100 mg | ORAL | Qty: 1

## 2020-04-11 NOTE — Unmapped (Signed)
Problem: Knowledge Deficit  Goal: Patient/family/caregiver demonstrates understanding of disease process, treatment plan, medications, and discharge instructions  Description: Complete learning assessment and assess knowledge base.  Outcome: Progressing     Problem: Fall Prevention  Goal: Patient will remain free of falls  Description: Assess and monitor vitals signs, neurological status including level of consciousness and orientation.  Reassess fall risk per hospital policy.    Ensure arm band on, uncluttered walking paths in room, adequate room lighting, call light and overbed table within reach, bed in low position, wheels locked, side rails up per policy (excluding SNF), and non-skid footwear provided.   Outcome: Progressing

## 2020-04-11 NOTE — Unmapped (Signed)
ADDICTION CONSULT, INITIAL EVALUATION    ASSESSMENT AND RECOMMENDATIONS:    Discussed risks, benefits, and options for treatment, expectations for treatment, and limitations of confidentiality. Provided psychoeducation about opoid use and alcohol use, use of medications, effect of substances on mental and physical health, use of 12-step or other peer support groups and techniques for relapse prevention.     Opioid use disorder, severe.   - Patient was previously evaluated by Dr. Elmarie Shiley from addiction for his fentanyl use. At that time, he did not want Subutex treatment and wished to be connected to a rehab facility. Today, he states that he is going through alcohol and opioid withdrawal and would like to use subutex for his symptoms. His last use of fentanyl was 6 am 04/10/20. He wishes to be connected to a residential treatment program to receive treatment for both his alcohol use and opioid use. Wishes to be enrolled in Methadone. Was at [facility] previously, but his services were stopped as patient was still using alcohol.   - There is concern for starting Subutex at the moment. Would wait atleast 48 hours from last use to avoid precipitated withdrawal. If patient has significant elevation in COWS, primary team could initiate Subutex tomorrow morning. Monitor for worsening withdrawal within 1-2 hours after initial buprenorphine dose.   - Recommend utilizing Subutex Withdrawal Protocol if primary team feels patient warrants it for withdrawal. If there is concern for precipitated withdrawal on buprenorphine, benzodiazepines being used for alcohol withdrawal could also be used for opioid withdrawal as well.    - Can use symptomatic management of withdrawals with PRN clonidine, Ondansetron, and Loperamide.     Alcohol use disorder, severe; alcohol withdrawal.   -States last known drink was around 8 am 04/10/20.   -Hx of complicated seizures in the past.   -Continue CIWA and Thiamine, folate   -Agree with Valium as baseline  benzo. If concern for worsening hepatic function, would switch to a non-hepatically metabolized benzo rather than diazepam.     Dr. Elmarie Shiley will follow up on referrals for residential treatment facilities next week.      Other SUD treatment: Not currently on any medications.       Tobacco treatment: None       MH treatment: Previously with Dr. Lawerance Sabal. Has not seen her in two years.      Yehuda Savannah   Pager: [phone]    [name] [id number]     Date/time of admission: 04/10/2020  7:09 PM  Time spent with patient:>30 mins    CC/Reason for Consult: Alcohol Withdrawal    HPI: Alexander [name] is a 34 y.o. year old male with alcohol use disorder, opioid use disorder, admitted for withdrawal and rehab needs.    Patient is known to addiction consult services from previous admissions. He was receiving methadone at [facility] in the past, but is unable to return after leaving due to his alcohol use. He was being referred to [facility] vs [facility] during his last admission, but left AMA. Since then, he reports daily drinking and used fentanyl yesterday morning. He now reports confusion, sweats, anxiety, tremor and wants treatment for his withdrawals. Patient states that he wishes to stop drinking and be enrolled in methadone treatment. He states that he knows he will die if he continues to use the substances. He has a niece whom he is fond of and wishes to be present in her life. He is not on any opioid use treatment currently and wants  to start methadone.     Substance Treatment: h/o suboxone treatment in the past. Was recently admitted with [facility] methadone program, but left the program after inpatient admission in March in order to try to go to residential programs that do not accept patients on methadone.    Past Psychiatric History:    Prior diagnoses: PTSD  ????????????????????????Prior inpatient psychiatric hospitalizations: last in 2015  ????????????????????????Outpatient Treatment: Was working with Dr Lawerance Sabal at [facility], but unclear if he is still  in treatment     Past Medical History:      Past Medical History:   Diagnosis Date   ??? Anxiety    ??? Bipolar disorder (CMS Dx)    ??? Bronchitis    ??? Depression    ??? Drug abuse and dependence (CMS Dx)    ??? H/O tooth extraction    ??? Hepatitis C    ??? HIV infection (CMS Dx)    ??? Liver disease Hep C   ??? PTSD (post-traumatic stress disorder)    ??? Substance induced mood disorder (CMS Dx)    ??? Thyroid disease      Past Surgical History:     Past Surgical History:   Procedure Laterality Date   ??? MANDIBLE SURGERY         Social/Developmental History:   Chart reviewed, no new findings.     Family History:      Family History   Problem Relation Age of Onset   ??? Drug abuse Brother    ??? Suicidality Brother    ??? Hypertension Mother    ??? Transient ischemic attack Mother    ??? Seizures Father    ??? Hypertension Father    ??? Cancer Father    ??? Breast Cancer Father    ??? Suicidality Father    ??? Drug abuse Brother    ??? Suicidality Brother        Allergies: No Known Allergies    Home Medications:   No current facility-administered medications on file prior to encounter.     Current Outpatient Medications on File Prior to Encounter   Medication Sig Dispense Refill   ??? folic acid (FOLVITE) 1 MG tablet      ??? TRIUMEQ 600-50-300 mg Tab TAKE ONE TABLET BY MOUTH DAILY 30 tablet 1       Current Medications ordered:  Scheduled Meds:  ??? abacavir-dolutegravir-lamivud  1 tablet Oral Daily 0900   ??? diazePAM  10 mg Oral TID   ??? enoxaparin  40 mg Subcutaneous Daily 0900   ??? folic acid  1 mg Oral Daily 0900    And   ??? multivitamin with folic acid  1 tablet Oral Daily 0900    And   ??? thiamine  100 mg Oral Daily 0900     PRN Meds:.LORazepam **OR** LORazepam    ROS: anxious, sweats, tremors    OBJECTIVE:  Vitals:  Marland Kitchen  Vitals:    04/11/20 1216 04/11/20 1303 04/11/20 1336 04/11/20 1445   BP: 137/90 119/81 (!) 127/93 115/67   BP Location:  Left arm  Left arm   Patient Position:  Lying  Lying   Pulse: 72 62 73 72   Resp: 19 18 13 14    Temp: 97.7 ??F (36.5 ??C)       TempSrc: Oral      SpO2:  97%  97%         Mental Status Exam:   Gait/Station: not assessed  Gen/Behav: WDWN. Appears older  than stated age. Calm/cooperative. Tremulous. Appears to be tired.   Speech: nl rate, volume and production.   Mood/Affect: anxious/full, congruent, nl range  Thought process:  linear and goal directed, no FOI, no LOA  Thought content: denies SI/HI. No delusions voiced  Perceptions: denies A/V hallucinations  Cognition: A&O to self and place. Does not accurately recall the date. Recent and remote memory grossly intact.  Judgment & Insight:   Fair/partial    OARRS: No flowsheet data found.    Labs:   Complete Blood Count:  Recent Labs     04/10/20  2236 04/11/20  0550   WBC 6.4 6.7   HGB 13.5 13.3   HCT 39.8 39.2   MCV 88.7 88.5   PLT 251 248       Basic Metabolic Panel:  Recent Labs     04/10/20  2236 04/11/20  0550   NA 138 140   K 4.4 4.1   CL 103 104   CO2 27 28   BUN 8 8   MG  --  2.0   PHOS  --  4.2       Hepatic Panel:  Recent Labs     04/10/20  2236 04/11/20  0550   AST 85* 66*   ALT 86* 75*   ALBUMIN 3.8 3.4*   3.4*        UDS available for review: No; Positive for:   []  benzodiazepines, []  barbiturates, []  cocaine, []  THC, []  amphetamines,   []  methamphetamines, []  opiates, []  oxycodone, []  fentanyl, []  methadone,  []  buprenorphine, []  MDMA, []  PCP

## 2020-04-11 NOTE — Unmapped (Signed)
Department of Internal Medicine  Daily Progress Note      Chief Complaint / Reason for Follow-Up     Alexander [name] is a 34 y.o. male on hospital day 1. The principal reason for today's follow up visit is Alcohol withdrawal (CMS Dx).    Interval History / Subjective     In ED. Feeling shakey, wondering if he can get more Ativan. Took methadone as recently as a few weeks ago. Has been on subutex in past.    Review of Systems (Focused)     CV: no CP  Lung: no SOB  GI: no nausea      Medications     Scheduled Meds:  ??? abacavir-dolutegravir-lamivud  1 tablet Oral Daily 0900   ??? diazePAM  10 mg Oral TID   ??? enoxaparin  40 mg Subcutaneous Daily 0900   ??? folic acid  1 mg Oral Daily 0900    And   ??? multivitamin with folic acid  1 tablet Oral Daily 0900    And   ??? thiamine  100 mg Oral Daily 0900     Continuous Infusions:  PRN Meds:  LORazepam **OR** LORazepam       Vital Signs     Temp:  [97.7 ??F (36.5 ??C)-98.5 ??F (36.9 ??C)] 97.7 ??F (36.5 ??C)  Heart Rate:  [62-116] 73  Resp:  [12-21] 13  BP: (106-155)/(53-114) 127/93  No intake or output data in the 24 hours ending 04/11/20 1353      Physical Exam     Gen: Age-appropriate adult, NAD. Alert. Thin.  HEENT: NCAT, EOM grossly intact, sclera non-icteric.  NECK: Soft, supple, trachea midline.   CV: RRR, S1 and S2 appropriate, no murmur appreciated.   PULM: CTAB. No wheezes/rales/rhonchi appreciated. Normal respiratory effort.  ABD: +BS, soft, non-tender, non-distended. No masses appreciated.  EXT: Intact distal pulses bilaterally, symmetric.   SKIN: Warm and dry. Track marks bilateral forearms.  NEURO: A&O x3, answers questions appropriately, no facial asymmetry, moves all extremities spontaneously.   PSYCH: Appropriate affect, good eye contact.      Laboratory Data       CBC 04/11/2020                 \\    13.3   /         6.7  \\______/ 248                 /            \\                             /   39.2    \\ Differential 04/11/2020    N 58.9 L 24.1 M 12.3 E 3.8 B 0.9       Renal 04/11/2020       140       104         8    /    ___ __ _____ ______/ 100                                       \\     4.1         28       0.72   \\  Ca     8.5 (04/11/2020)  Mg     2.0 (04/11/2020)  Phos 4.2 (04/11/2020) Lipids    Lab Results   Component Value Date    CHOLTOT 132 01/27/2020    TRIG 89 01/27/2020    HDL 40 (L) 01/27/2020    LDL 74 01/27/2020      LFTs 04/11/2020               \\      0.3      /              \\    0.1    /         66  \\______/  75               /            \\              /    64      \\ PT/INR/PTT - 04/11/2020    PT  13.0 INR  1.0 PTT  No results found for requested labs within last 16109 hours.                  Lab 04/11/20  0550   COLOR, URINE Yellow   CLARITY Clear   SPECIFIC GRAVITY, URINE 1.010   PH UA 7.0   PROTEIN UA Negative   GLUCOSE UA Negative   KETONES UA Negative   BILIRUBIN UA Negative   BLOOD UA Negative   NITRITE UA Negative   UROBILINOGEN UA <2.0   LEUKOCYTES UA Negative             No results found for: NTPROBNP    Lab Results   Component Value Date    TSH 1.21 10/17/2017    FREET4 1.08 11/14/2013             Diagnostic Studies     X-ray Portable Chest    Result Date: 04/10/2020  IMPRESSION: No acute cardiopulmonary abnormality. Report Verified by: Lana Fish, MD at 04/10/2020 10:45 PM EDT          Assessment & Plan     Alexander [name] is a 34 y.o. male on HD# 1 with Alcohol withdrawal (CMS Dx).  The medical issues being addressed in today's encounter are as follows:    Principal Problem:    Alcohol withdrawal (CMS Dx)      #Alcohol withdrawal  Has history of seizures with withrawal. Was trying to taper down on his own at home. Drinking 8% beers. Last drink was 5/7. Withdrawal symptoms started 5/7 AM.   - CIWA with PRN ativan   - valium taper: 10mg  q6h   - addiction services c/s  ??  #Hemoptysis   Reports teaspoon-sized amount of bright red blood coughed up once today. No recent nosebleeds. Smoking 1-2PPD, no change in cough. Given small amount, without overt infectious  symptoms this could be 2/2 pneumonitis in the setting of smoking. Also consider atypical pneumonia in the setting of HIV. CXR w/o acute abnormality. Hgb 13.5  - continue to monitor   - counsel on smoking cessation  ??  #HIV  Last CD4 471 (01/2020), last VL undetectable (01/2020). Follows with Dr. Merilynn Finland. Current regimen: triumeq, missed 2 doses a couple weeks ago but otherwise takes as prescribed. No thrush on exam.  - CD4 count pending   ??  #Fentanyl withdrawal  Last used 5/7. Injects antecubital fossae. Had been going to suboxone clinic, but recently  stopped. Wants to get connected to go to rehab.   - COWS  -addiction c/s  ??  #Untreated HepC  Slight elevation of AST/ALT  ??  #Tobacco use disorder  - NRT      Nutrition:  Diet Orders          Diet regular starting at 05/07 2218          Code Status: Full Code    Signed:  Jonita Albee Hortence Charter, DO  04/11/2020, 1:53 PM

## 2020-04-11 NOTE — Unmapped (Signed)
COWS performed. Current score is 5.

## 2020-04-11 NOTE — Unmapped (Signed)
Assumed care from previous RN. Pt resting in bed. Alert and oriented x4. Respirations are easy, Lycia Sachdeva, and unlabored. No complaints of chest pain or shortness of breath. No signs of acute distress. Pt denies further needs at this time. Monitor in place. Vital signs stable. Call light within reach.

## 2020-04-11 NOTE — Unmapped (Signed)
Ready and clean bed assigned to [id number]. Pt updated on plan of care including transfer and is agreeable. Receiving RN may call [phone] to consult ED RN with questions regarding patients care. Pt is leaving the department in stable condition with all personal items in possession.   Ordered medications that have been received from Pharmacy will be tubed to receiving unit.  The patient does not have a patient monitor at bedside in the ED.  Pt was swabbed for COVID 19 and is considered Low risk.   Most recent vitals: BP 115/67 (BP Location: Left arm, Patient Position: Lying)    Pulse 72    Temp 97.7 ??F (36.5 ??C) (Oral)    Resp 14    SpO2 97%     Addison Bailey ,RN

## 2020-04-12 MED ORDER — cloNIDine HCL (CATAPRES) tablet 0.1 mg
0.1 | Freq: Two times a day (BID) | ORAL | Status: AC | PRN
Start: 2020-04-12 — End: 2020-04-12
  Administered 2020-04-12: 14:00:00 0.1 mg via ORAL

## 2020-04-12 MED FILL — VITAMIN B-1 (MONONITRATE) 100 MG TABLET: 100 100 mg | ORAL | Qty: 1

## 2020-04-12 MED FILL — DIAZEPAM 10 MG TABLET: 10 10 MG | ORAL | Qty: 1

## 2020-04-12 MED FILL — ENOXAPARIN 40 MG/0.4 ML SUBCUTANEOUS SYRINGE: 40 40 mg/0.4 mL | SUBCUTANEOUS | Qty: 0.4

## 2020-04-12 MED FILL — CLONIDINE HCL 0.1 MG TABLET: 0.1 0.1 MG | ORAL | Qty: 1

## 2020-04-12 MED FILL — LORAZEPAM 1 MG TABLET: 1 1 MG | ORAL | Qty: 1

## 2020-04-12 MED FILL — FOLIC ACID 1 MG TABLET: 1 1 MG | ORAL | Qty: 1

## 2020-04-12 MED FILL — THERA 400 MCG TABLET: 400 400 mcg | ORAL | Qty: 1

## 2020-04-12 MED FILL — LORAZEPAM 1 MG TABLET: 1 1 MG | ORAL | Qty: 2

## 2020-04-12 NOTE — Unmapped (Addendum)
Now 48 hours from his last EtOH use.  Hx of seizures.  Today reports that he feels a bit worse.  Tremulous on exam.  CIWAs 11-13   - Continues of Valium 10mg  QID   - CIWA q4 with prn ativan available   - continue thiamine and folate   - added clonidine prn

## 2020-04-12 NOTE — Unmapped (Signed)
Reported on admission.  Per patient, now resolved.  CBC stable.  Will continue to monitor.

## 2020-04-12 NOTE — Unmapped (Addendum)
Appreciate Addiction medicine following.  No overt signs of opiod withdrawal.  Has prn clonidine available.   Consider d/c to rehab.

## 2020-04-12 NOTE — Unmapped (Signed)
PSYCHIATRY CONSULT, FOLLOW-UP PROGRESS NOTE    KALANI STHILAIRE [id number]     Date/time of admission: 04/10/2020  7:09 PM Length of Stay: 1    Subjective:  -NAEO. Patient has left AMA today.     -Upon examination patient was laying in bed with covers with mild trembling. He was alert and oriented in discussing that he was feeling uncomfortable in the midst of his withdrawal but this is comparable to the previous times that he has been seen in the hospital with similar presentation. He has no complaints except that he believes the valium should be more frequent or at an increased dose. We discussed that the nurse would be coming soon with the next dose and there are breakthrough medications PRN. He discussed his interests in rehab after discharge, wanting to start suboxone or methadone, and his desire to have pharmacological help with managing his cravings for EtOH. He does have a home through [place], he admits that he prefers to sleep outside with his homeless friends since he feels less alone when with them, he has [place] for [employer] and manages his prescriptions for HIV and any future ones through them. At this time he does not have a car, but is confident he will be able to navigate the bus system to meet the needs of going to get suboxone once he has left the hospital. He panhandles at the moment and uses most of his money on acquiring drugs such as fentanyl, his daily drinking habit (12-16 tall boys/day), or smoking 2 ppd of cigarettes. We talked about using this money for other things to turn his life around and he is considering how he can do this in the future. He desires to move to [place] one to day to reunite with his family and move away from the environment he is struggling in right now when it comes to access to drugs.     PRN meds in past 24 hrs:  -ativan 1mg      Behavior issues in last 24hours: none  Reviewed staff/RN notes.     PMH/SH/FH reviewed and no changes    Current Medications  ordered:  ??? abacavir-dolutegravir-lamivud  1 tablet Oral Daily 0900   ??? diazePAM  10 mg Oral TID   ??? enoxaparin  40 mg Subcutaneous Daily 0900   ??? folic acid  1 mg Oral Daily 0900    And   ??? multivitamin with folic acid  1 tablet Oral Daily 0900    And   ??? thiamine  100 mg Oral Daily 0900     LORazepam **OR** LORazepam    OBJECTIVE:  ROS: Admits to HA, pleuritic chest pain, tremors, general feeling of ill.  Denies diarrhea/constipation, appetite changes, sinus or rhinorrhea concerns, trouble with fever, rash, vision changes, shortness of breath, nausea, extremity pain, weakness, dysuria.    Vitals:  Marland Kitchen  Vitals:    04/11/20 2340 04/12/20 0533 04/12/20 0601 04/12/20 0806   BP: (!) 156/99 142/86  (!) 148/91   BP Location: Left arm   Left arm   Patient Position: Lying   Lying   Pulse: 63 68  67   Resp: 18 18  18    Temp: 97.8 ??F (36.6 ??C) 97.6 ??F (36.4 ??C)  97.9 ??F (36.6 ??C)   TempSrc: Oral Oral  Oral   SpO2: 99%   98%   Weight:   151 lb 12.8 oz (68.9 kg)    Height:  Mental Status Exam:   Gait/Station: not assessed  Gen/Behav: WDWN. Appears older than stated age. Calm/cooperative. Tremulous. Appears to be tired.   Speech: nl rate, volume and production.   Mood/Affect: anxious/full, congruent, nl range  Thought process:  linear and goal directed, no FOI, no LOA  Thought content: denies SI/HI. No delusions voiced  Perceptions: denies A/V hallucinations  Cognition: A&O to self and place.   Judgment & Insight:   Poor/partial        Labs:  Complete Blood Count:  Recent Labs     04/10/20  2236 04/11/20  0550   WBC 6.4 6.7   HGB 13.5 13.3   HCT 39.8 39.2   MCV 88.7 88.5   PLT 251 248       Basic Metabolic Panel:  Recent Labs     04/10/20  2236 04/11/20  0550   NA 138 140   K 4.4 4.1   CL 103 104   CO2 27 28   BUN 8 8   MG  --  2.0   PHOS  --  4.2       Hepatic Panel:  Recent Labs     04/10/20  2236 04/11/20  0550   AST 85* 66*   ALT 86* 75*   ALBUMIN 3.8 3.4*   3.4*          ASSESSMENT AND  RECOMMENDATIONS:  ??  Discussed risks, benefits, and options for treatment, expectations for treatment, and limitations of confidentiality. Provided psychoeducation about opoid use and alcohol use, use of medications, effect of substances on mental and physical health, use of 12-step or other peer support groups and techniques for relapse prevention.   ??  Opioid use disorder, severe.   - Patient was previously evaluated by Dr. Elmarie Shiley from addiction for his fentanyl use. At that time, he did not want Subutex treatment and wished to be connected to a rehab facility. He admits that he is going through alcohol and opioid withdrawal and would like to use subutex for his symptoms. His last use of fentanyl was 6 am 04/10/20. He wishes to be connected to a residential treatment program to receive treatment for both his alcohol use and opioid use. Wishes to be enrolled in Methadone. Was at [facility] previously, but his services were stopped as patient was still using alcohol.   - 48 hours has passed from last use,so as to avoid precipitated withdrawal with Subutex start. If patient has significant elevation in COWS, primary team could initiate Subutex tomorrow morning. Monitor for worsening withdrawal within 1-2 hours after initial buprenorphine dose.   - Recommended utilizing Subutex Withdrawal Protocol if primary team feels patient warrants it for withdrawal. If there is concern for precipitated withdrawal on buprenorphine, benzodiazepines being used for alcohol withdrawal could also be used for opioid withdrawal as well.    - Can use symptomatic management of withdrawals with PRN clonidine, Ondansetron, and Loperamide.   ??  Alcohol use disorder, severe; alcohol withdrawal.   -States last known drink was around 8 am 04/10/20.   -Hx of complicated seizures in the past.   -Continue CIWA and Thiamine, folate   -Agree with Valium as baseline benzo. If concern for worsening hepatic function, would switch to a non-hepatically metabolized  benzo rather than diazepam.   ??  Dr. Elmarie Shiley will follow up on referrals for residential treatment facilities next week.    ??  Other SUD treatment: Not currently on any medications.  Tobacco treatment: None       MH treatment: Previously with Dr. Lawerance Sabal. Has not seen her in two years.    ??  Samuella Bruin, DO PGY2  Pager: [phone]

## 2020-04-12 NOTE — Nursing Note (Signed)
Patient stated  That he wanted to leave against medical advice. Medical team notified. IV removed and pt given personal belongings. Pt leaves the unit calm and cooperatively.

## 2020-04-12 NOTE — Unmapped (Signed)
[facility]  Department of Internal Medicine  Inpatient Discharge Summary    Patient: Alexander [name]   MRN: [id number]   CSN: [id number]    Date of Admission: 04/10/2020  Date of Discharge: 04/12/2020  Attending Physician: No att. providers found       Past Medical History     Past Medical History:   Diagnosis Date   ??? Anxiety    ??? Bipolar disorder (CMS Dx)    ??? Bronchitis    ??? Depression    ??? Drug abuse and dependence (CMS Dx)    ??? H/O tooth extraction    ??? Hepatitis C    ??? HIV infection (CMS Dx)    ??? Liver disease Hep C   ??? PTSD (post-traumatic stress disorder)    ??? Substance induced mood disorder (CMS Dx)    ??? Thyroid disease           Discharge Diagnoses     Active Hospital Problems    Diagnosis Date Noted   ??? Alcohol withdrawal (CMS Dx) [F10.239] 02/01/2020   ??? Opioid use disorder (CMS Dx) [F11.99] 04/12/2020   ??? Cough with hemoptysis [R04.2] 04/12/2020   ??? Transaminitis [R74.01] 04/12/2020      Resolved Hospital Problems   No resolved problems to display.         Operations/Procedures Performed (include dates)     Surgeries:        Lines/Drains/Airways:  Patient Lines/Drains/Airways Status    Active Line / PIV Line     Name:   Placement date:   Placement time:   Site:   Days:    Peripheral IV 04/11/20 Right Antecubital   04/11/20    1100    Antecubital   1         NHSN Device Days (03/13/2020 to 04/11/2020)     Central line: 0    Urinary catheter: 0                  Notable Imaging Studies:  X-ray Portable Chest   Final Result   IMPRESSION:    No acute cardiopulmonary abnormality.      Report Verified by: Lana Fish, MD at 04/10/2020 10:45 PM EDT              Other Procedures:  1. None      Consulting Services (include reason)     1. Addiction psychiatry - alcohol and opioid use disorder       Allergies     No Known Allergies      Discharge Medications     LEFT AMA      Reason for Admission     Alexander [name] is a 34 y.o. male with PMH of HIV on triumeq, alcohol and fentanyl abuse,  presenting to the hospital with alcohol withdrawal.      Hospital Course By Problem     Patient presented for alcohol withdrawal while trying to taper alcohol use in hope to make it to a residential rehab program. Also with active IV fentanyl use. Was admitted and given scheduled valium and PRN ativan while CIWAs and COWs monitored. Addiction psych consulted for possible initiation of subutex and assistance with EtOH use disorder resources. Patient ultimately left AMA on 5/9 despite discussions about changing doses of medications, increasing valium and initiating subutex.      Condition on Discharge     1. Functional Status: Normal    2. Mental Status: Normal  3. Diet / Tube Feeding / TPN:  Diet Orders          Diet regular starting at 05/07 2218        As listed above    4. Respiratory / Lines & Tubes / Wounds:  None required    5. Discharge Physical Exam:  BP (!) 148/91 (BP Location: Left arm, Patient Position: Lying)    Pulse 67    Temp 97.9 ??F (36.6 ??C) (Oral)    Resp 18    Ht 5' 9 (1.753 m)    Wt 151 lb 12.8 oz (68.9 kg)    SpO2 98%    BMI 22.42 kg/m??    BP (!) 148/91 (BP Location: Left arm, Patient Position: Lying)    Pulse 67    Temp 97.9 ??F (36.6 ??C) (Oral)    Resp 18    Ht 5' 9 (1.753 m)    Wt 151 lb 12.8 oz (68.9 kg)    SpO2 98%    BMI 22.42 kg/m??     General Appearance:  Alert, cooperative, no distress, appears stated age   Head:  Normocephalic, without obvious abnormality, atraumatic   Eyes:  PERRL, conjunctiva/corneas clear, EOM's intact, fundi benign, both eyes   Ears:  Normal TM's and external ear canals, both ears   Nose: Nares normal, septum midline, mucosa normal, no drainage or sinus tenderness   Throat: Lips, mucosa, and tongue normal; teeth and gums normal   Neck: Supple, symmetrical, trachea midline, no adenopathy, thyroid: not enlarged, symmetric, no tenderness/mass/nodules, no carotid bruit or JVD   Back:   Symmetric, no curvature, ROM normal, no CVA tenderness   Lungs:   Clear to  auscultation bilaterally, respirations unlabored   Chest Wall:  No tenderness or deformity   Heart:  Regular rate and rhythm, S1, S2 normal, no murmur, rub or gallop   Abdomen:   Soft, non-tender, bowel sounds active all four quadrants,  no masses, no organomegaly   Genitalia:  Normal male   Rectal:  Normal tone, normal prostate, no masses or tenderness;  guaiac negative stool   Extremities: Extremities normal, atraumatic, no cyanosis or edema   Pulses: 2+ and symmetric   Skin: Skin color, texture, turgor normal, no rashes or lesions   Lymph nodes: Cervical, supraclavicular, and axillary nodes normal   Neurologic: Normal           Core Measure Documentation (As Applicable)     Most Recent Wt: Weight: 151 lb 12.8 oz (68.9 kg)                Disposition     Home independent      Follow-Up Appointments     No future appointments.    No follow-up provider specified.       Patient Instructions / Follow-Up Items for Receiving Physician     1. Left AMA, still desires residential program for EtOH cessation.        Signed:    Daryel November, MD   04/12/2020, 12:36 PM

## 2020-04-12 NOTE — Unmapped (Signed)
Possibly related to EtOH use.  Has begun to trend down.  Will monitor.

## 2020-04-12 NOTE — Unmapped (Signed)
INTERNAL MEDICINE ATTENDING PHYSICIAN DAILY PROGRESS NOTE     Patient seen with resident team on rounds on 04/12/2020.  I personally examined the patient and discussed the plan of care and I agree with the resident???s/AI's findings and documentation of the history, exam, and assessment/plan. Below is additional documentation of my history, exam, and assessment.    Rozell Searing, MD  [phone]    Subjective:      Patient ID: Alexander [name] is a 34 y.o. male currently admitted for Alcohol withdrawal (CMS Dx)    Admitted on: 04/10/2020    Hospital day: 1    Chief complaint: Alcohol withdrawal (CMS Dx)    Interval History: The patient was transferred to the [facility] team overnight.  This morning he initially expressed optimism and plan for rehab, though he noted that he felt more tremulous.  Later during rounds, we were called to bedside b/c he was interested in leaving.  Patient reported that he wanted to smoke a cigarette and felt like the medications were working.      ROS negative except for noted above    PMH reviewed, no changes  PSH reviewed, no changes  FMH reviewed, no changes  SOC reviewed, no changes    Meds reviewed    Objective:      Vital signs in last 24 hours:  Temp:  [97.6 ??F (36.4 ??C)-98.6 ??F (37 ??C)] 97.9 ??F (36.6 ??C)  Heart Rate:  [63-95] 67  Resp:  [14-20] 18  BP: (115-156)/(67-99) 148/91  No intake or output data in the 24 hours ending 04/12/20 1401    Discheveled appearing male  Appears tremulous and uncomfortable.  NCAT, EOMI  RRR, no appreciable murmur  Normal resp effort. Lungs CTAB  abd thin, soft, NT/ND  No LE edema  Actively tremulous on exam        I have reviewed labs and imaging completed in the last 24 hours.          Assessment & Plan:         Alexander [name] is a 34 y.o. male admitted with Alcohol withdrawal (CMS Dx)      * Alcohol withdrawal (CMS Dx)??? (present on admission)  Now 48 hours from his last EtOH use.  Hx of seizures.  Today reports that he feels a bit worse.  Tremulous on exam.   CIWAs 11-13   - Continues of Valium 10mg  QID   - CIWA q4 with prn ativan available   - continue thiamine and folate   - added clonidine prn      Transaminitis  Possibly related to EtOH use.  Has begun to trend down.  Will monitor.      Cough with hemoptysis  Reported on admission.  Per patient, now resolved.  CBC stable.  Will continue to monitor.      Opioid use disorder (CMS Dx)  Appreciate Addiction medicine following.  No overt signs of opiod withdrawal.  Has prn clonidine available.   Consider d/c to rehab.        Continue management for other medical problems per resident note.     Disp:  Patient expressed desire to leave AMA.  We spent some time discussing his continued risk of seizures or complicated EtOH wirthdrawal.  He stated he knew how to get a quick fix and really wanted to smoke.  AMA paperwork signed.    I saw and evaluated the patient on hospital Day# 1.  I discussed the case with the  resident/ AI.  Plan was discussed with the team and patient.      Rozell Searing, MD  04/12/2020

## 2020-04-12 NOTE — Unmapped (Signed)
Problem: Knowledge Deficit  Goal: Patient/family/caregiver demonstrates understanding of disease process, treatment plan, medications, and discharge instructions  Description: Complete learning assessment and assess knowledge base.  Outcome: Completed     Problem: Fall Prevention  Goal: Patient will remain free of falls  Description: Assess and monitor vitals signs, neurological status including level of consciousness and orientation.  Reassess fall risk per hospital policy.    Ensure arm band on, uncluttered walking paths in room, adequate room lighting, call light and overbed table within reach, bed in low position, wheels locked, side rails up per policy (excluding SNF), and non-skid footwear provided.   Outcome: Completed     Problem: Acute Pain  Description: Patient's pain progressing toward patient's stated pain goal  Goal: Patient displays improved well-being such as baseline levels for pulse, BP, respirations and relaxed muscle tone or body posture  Outcome: Completed  Goal: Patient will manage pain with the appropriate technique/intervention  Description: Assess and monitor patient's pain using appropriate pain scale. Collaborate with interdisciplinary team and initiate plan and interventions as ordered.  Re-assess patient's pain level 30-60 minutes after pain management intervention.  Outcome: Completed

## 2020-04-18 ENCOUNTER — Inpatient Hospital Stay
Admit: 2020-04-18 | Discharge: 2020-04-18 | Disposition: A | Discharge status: Home / Self Care | Payer: PRIVATE HEALTH INSURANCE

## 2020-04-18 DIAGNOSIS — F10230 Alcohol dependence with withdrawal, uncomplicated: Secondary | ICD-10-CM

## 2020-04-18 MED ORDER — ibuprofen (MOTRIN) tablet 800 mg
400 | Freq: Once | ORAL | Status: AC
Start: 2020-04-18 — End: 2020-04-18
  Administered 2020-04-18: 14:00:00 800 mg via ORAL

## 2020-04-18 MED ORDER — ondansetron (ZOFRAN) injection 4 mg
4 | Freq: Once | INTRAMUSCULAR | Status: AC
Start: 2020-04-18 — End: 2020-04-18

## 2020-04-18 MED ORDER — diazePAM (VALIUM) tablet 10 mg
5 | Freq: Once | ORAL | Status: AC
Start: 2020-04-18 — End: 2020-04-18
  Administered 2020-04-18: 14:00:00 10 mg via ORAL

## 2020-04-18 MED ORDER — ondansetron (ZOFRAN ODT) 4 MG disintegrating tablet
4 | ORAL_TABLET | Freq: Three times a day (TID) | ORAL | 0 refills | Status: AC | PRN
Start: 2020-04-18 — End: 2020-08-05

## 2020-04-18 MED ORDER — diazePAM (VALIUM) tablet 10 mg
5 | Freq: Once | ORAL | Status: AC
Start: 2020-04-18 — End: 2020-04-18
  Administered 2020-04-18: 10:00:00 10 mg via ORAL

## 2020-04-18 MED ORDER — chlordiazePOXIDE (LIBRIUM) 5 MG capsule
5 | ORAL_CAPSULE | Freq: Three times a day (TID) | ORAL | 0 refills | Status: AC | PRN
Start: 2020-04-18 — End: 2020-04-21

## 2020-04-18 MED ORDER — ondansetron (ZOFRAN) tablet 4 mg
4 | Freq: Once | ORAL | Status: AC
Start: 2020-04-18 — End: 2020-04-18
  Administered 2020-04-18: 14:00:00 4 mg via ORAL

## 2020-04-18 MED FILL — ONDANSETRON HCL 4 MG TABLET: 4 4 MG | ORAL | Qty: 1

## 2020-04-18 MED FILL — DIAZEPAM 5 MG TABLET: 5 5 MG | ORAL | Qty: 2

## 2020-04-18 MED FILL — IBUPROFEN 400 MG TABLET: 400 400 MG | ORAL | Qty: 2

## 2020-04-18 NOTE — Unmapped (Signed)
Pt verbalizes understanding of discharge instructions and has been given the opportunity to ask questions.  Due to infection precautions, patient is unable to sign at this time.

## 2020-04-18 NOTE — Unmapped (Signed)
Bed: A08U  Expected date:   Expected time:   Means of arrival:   Comments:  Triage

## 2020-04-18 NOTE — ED Triage Notes (Signed)
Pt is now sleeping in chair in lobby.

## 2020-04-18 NOTE — Unmapped (Signed)
You were seen in the emergency department with concern for alcohol withdrawal.  You will be discharged with a prescription for Librium, which you should take for your symptoms of alcohol withdrawal as prescribed and as needed, as well as Zofran for nausea.  You should plan to follow-up at the [facility] on Monday.  I will plan to send a message to our peer support counselors to let them know that you were in the ED so that they can hopefully reach out.    Please return if you experience new or concerning symptoms, including new hallucinations, palpitations, sweating, uncontrolled symptoms.    Lavone Neri, MD

## 2020-04-18 NOTE — Unmapped (Signed)
[facility] approached pt to assess needs. Pt declined HIV test. Necessary resources and program education given.

## 2020-04-18 NOTE — ED Triage Notes (Signed)
Spoke with addiction services yesterday and was supposed to be seen yesterday for clearance for a rehab. Pt decided to wait until now to come in to be seen for clearance. Pt states last drink was about 6 hours PTA.

## 2020-04-18 NOTE — Unmapped (Signed)
[place] ED Note    Date of service:  04/18/2020    Reason for Visit: Medical Screening      Patient History     HPI:  Alexander [name] is a 34 y.o. male with PMHx of alcohol abuse, HIV, hepatitis, PTSD, and bipolar disorder who presents with a chief complaint of alcohol withdrawal.     The patient presents for evaluation of alcohol withdrawal.  The patient states that his last drink was at approximately 1030 last night.  He recently left AGAINST MEDICAL ADVICE from the hospital and has continued to drink after he left.  He states that he now wants to get clean and would like to stop drinking however he knows that he cannot get in treatment until Monday.  He currently endorses anxiety, tremor, nausea, headache, body aches, and other sequela of alcohol withdrawal.  He states that he has had seizure in the past.  He was feeling better symptom wise while he was in the hospital but left for unclear reasons.    Aside from the above, patient denies any aggravating or alleviating factors or associated symptoms.       Past Medical History:   Diagnosis Date   ??? Anxiety    ??? Bipolar disorder (CMS Dx)    ??? Bronchitis    ??? Depression    ??? Drug abuse and dependence (CMS Dx)    ??? H/O tooth extraction    ??? Hepatitis C    ??? HIV infection (CMS Dx)    ??? Liver disease Hep C   ??? PTSD (post-traumatic stress disorder)    ??? Substance induced mood disorder (CMS Dx)    ??? Thyroid disease        Past Surgical History:   Procedure Laterality Date   ??? MANDIBLE SURGERY         TREYCE SPILLERS  reports that he has been smoking cigarettes. He has a 11.00 pack-year smoking history. He has never used smokeless tobacco. He reports current alcohol use. He reports current drug use. Drugs: Heroin, Other-see comments, Marijuana, and Cocaine.    Previous Medications    FOLIC ACID (FOLVITE) 1 MG TABLET        TRIUMEQ 600-50-300 MG TAB    TAKE ONE TABLET BY MOUTH DAILY       Allergies:   Allergies as of  04/18/2020   ??? (No Known Allergies)       Review of Systems     ROS:  Pertinent positives include muscle aches, tremor, headache, anxiety, tingling. Pertinent negatives include no abdominal pain, vomiting, diarrhea, constipation, fever. Otherwise all other systems were reviewed and were negative    Physical Exam     Vitals:    04/18/20 0209 04/18/20 0550   BP: (!) 159/107 135/85   BP Location: Right arm Right arm   Patient Position: Sitting Lying   Pulse: 117 104   Resp: 18 17   Temp: 98.4 ??F (36.9 ??C)    TempSrc: Oral    SpO2: 99% 96%       General: Disheveled appearing male lying in bed in no acute distress.  HEENT:  Normocephalic, atraumatic. Mucous membranes are moist.   Eyes: Anicteric, EOMI   Neck:  Supple, trachea midline   Pulmonary:   Lungs clear to auscultation bilaterally. No increased work of breathing or tachypnea.   Cardiac: Slightly tachycardic with regular rhythm.  Good distal perfusion.  Abdomen:  Soft, non-tender, non-distended.   Extremities: 2+ radial  pulses. No extremity deformity, swelling or tenderness appreciated.  Very mild tremor with outstretched fingers.  Skin:  No rashes or bruising.  Neuro:  Alert and oriented x3. Speech normal. Moves UE and LE spontaneously.   Psych:  Mood and affect appropriate for situation.     Diagnostic Studies     Labs:    Please see electronic medical record for any tests performed in the ED     Radiology:    Please see electronic medical record for any tests performed in the ED    EKG: not done.     Emergency Department Procedures     Procedures      ED Course and MDM     Alexander [name] is a 34 y.o. male with a history and presentation as described above in HPI.  The patient was evaluated by myself, the ED Attending Physician, Dr. Lorenz Coaster. All management and disposition plans were discussed and agreed upon. Appropriate labs and diagnostic studies were reviewed as they were made available. Pertinent laboratory studies in medical decision making are listed below.      Upon presentation, the patient was slightly hypertensive and very slightly tachycardic.  This improved with rest to a rate in the 90s.  He was given some Valium p.o. for a CIWA score of 12.  He has no infectious history or infectious symptoms and is most likely an alcohol withdrawal from lack of alcohol intake for the past 5 hours.  He was given 10 of Valium.  On reassessment after the Valium, he was sleeping.  I discussed with him the possibility of going home versus admission and he was amenable to either pending his symptom improvement.  I will hold off on labs at this time given CIWA of 12.     At this time I am going off-service and will be signing out care of this patient to my colleague Dr. Jillene Bucks for further care. My colleague's responsibilities will include:    -Reassessment and disposition    Consults:  None    Summary of Treatment in ED:    Medications   diazePAM (VALIUM) tablet 10 mg (10 mg Oral Given 04/18/20 0610)           Impression     1. Alcohol withdrawal syndrome without complication (CMS Dx)            Jonelle Sports, MD  Resident  04/18/20 [phone]

## 2020-04-18 NOTE — ED Triage Notes (Signed)
Security states pt walked out of ED. Pt backpack and bag found in lobby. Pt found outside and told to remain in the lobby to be seen. Pt also told that he must keep his belongings with him at all time and we are not responsible for his property.

## 2020-04-18 NOTE — Unmapped (Signed)
ED Attending Attestation Note    Date of service:  04/18/2020    This patient was seen by the resident physician.  I have seen and examined the patient, agree with the workup, evaluation, management and diagnosis. The care plan has been discussed and I concur.     My assessment reveals a 34 y.o. male who presents for medical evaluation.  The patient states he was trying to get into [facility] for rehab.  His last drink of alcohol was 10 PM.  He states he is also used fentanyl in the last 24 hours.  On exam the patient is alert and oriented.  He is calm and cooperative.  He is mentating appropriately.  He walks with a steady gait.

## 2020-04-18 NOTE — Unmapped (Signed)
[place] ED  Reassessment Note    Alexander [name] is a 34 y.o. male who presented to the emergency department on 04/18/2020. This patient was initially seen by an off-going provider and their care has been turned over to me. Please see the original provider's note for details regarding the initial history, physical exam and ED course.  At the time of turnover the following steps in the patient's evaluation were pending:     - reassess and disposition    Briefly, patient is a 34 year old male with a history of alcohol use disorder, HIV, hepatitis, PTSD, bipolar disorder who presented with a chief complaint of alcohol withdrawal with desire for connection to care.  His last drink was at 1030 yesterday, 5/14, in the evening.  He last used Fentanyl yesterday and is not on any MAT.  Notably, he has left multiple times AGAINST MEDICAL ADVICE after being admitted to the hospital.  On arrival, patient was noted to have a CIWA score of 12.  He was administered 10 mg of Valium with concern for alcohol withdrawal.    On my reassessment, patient was sleeping quietly.  On awaking, he reported initial improvement after Valium, but recent recurrence of anxiety, tremor, sweating.  In addition to the symptoms, he also reports a headache, nausea.  No episodes of emesis witnessed in ED.  I repeated a CIWA score and it was noted to be 10.  I gave him a repeat dose of Valium 10 mg, Zofran 4 mg, and Ibuprofen 800 mg. On reassessment, patient again sleeping soundly, but on awaking, reported improvement in his headache, but some persistent anxiety, tremors, nausea.  I discussed outpatient treatment for alcohol withdrawal until he is able to secure admission to a detox facility on Monday using Librium as well as Zofran.  Patient was amenable to plan.  Low concern for alternative etiology of symptoms at this time to include infection.  Furthermore, vital signs have remained stable in the ED and do not support alcohol withdrawal with  complication.    He will be discharged with a prescription for Librium 5 mg 3 times daily as needed as well as Zofran 4 mg as needed.  I will send a message to Robie Ridge, one of our peer support counselors, to facilitate connection to care.  Patient stable for discharge at this time.  Strict return precautions communicated verbally and in writing.    Vitals:    04/18/20 0747 04/18/20 0847 04/18/20 1003 04/18/20 1047   BP: 125/73 122/88 108/69 134/81   BP Location: Right arm  Right arm    Patient Position: Lying  Lying    Pulse: 94 103 75 75   Resp: 14 18 15 9    Temp:       TempSrc:       SpO2: 94% 99% 98% 96%     Medications   diazePAM (VALIUM) tablet 10 mg (10 mg Oral Given 04/18/20 0610)   diazePAM (VALIUM) tablet 10 mg (10 mg Oral Given 04/18/20 0949)   ibuprofen (MOTRIN) tablet 800 mg (800 mg Oral Given 04/18/20 0949)   ondansetron (ZOFRAN) tablet 4 mg (4 mg Oral Given 04/18/20 0949)       Clinical Impression:    1.  Alcohol withdrawal syndrome without complication

## 2020-05-13 ENCOUNTER — Inpatient Hospital Stay: Admit: 2020-05-13 | Discharge: 2020-05-13 | Discharge status: Against Medical Advice | Payer: PRIVATE HEALTH INSURANCE

## 2020-05-13 NOTE — Unmapped (Signed)
SUD Team made aware of this patient's presence in the emergency department via:  Epic review    EIP PRS met with Pt in consult room. Pt is interested in linkage once medically cleared. SUD Team will continue to monitor and reengage once applicable or requested.      Robie Ridge, PRS  Linkage Coordinator/Peer Recovery Specialist  Early Intervention Program ([facility]) - SUD Team  Dept. of Emergency Medicine  [phone]

## 2020-05-13 NOTE — ED Triage Notes (Signed)
Pt injected what he thought was fentanyl with some friends and now feels funny. Pt unable to describe other that. Pt endorses ETOH withdrawal. Per EMS pt tachycardic other VS WNL.

## 2020-06-01 ENCOUNTER — Inpatient Hospital Stay
Admit: 2020-06-01 | Discharge: 2020-06-01 | Disposition: A | Discharge status: Home / Self Care | Payer: PRIVATE HEALTH INSURANCE

## 2020-06-01 ENCOUNTER — Emergency Department: Admit: 2020-06-01 | Payer: PRIVATE HEALTH INSURANCE

## 2020-06-01 DIAGNOSIS — F10120 Alcohol abuse with intoxication, uncomplicated: Secondary | ICD-10-CM

## 2020-06-01 LAB — BASIC METABOLIC PANEL
Anion Gap: 15 mmol/L (ref 3–16)
BUN: 3 mg/dL (ref 7–25)
CO2: 23 mmol/L (ref 21–33)
Calcium: 9.3 mg/dL (ref 8.6–10.3)
Chloride: 103 mmol/L (ref 98–110)
Creatinine: 0.68 mg/dL (ref 0.60–1.30)
Glucose: 104 mg/dL (ref 70–100)
Osmolality, Calculated: 289 mOsm/kg (ref 278–305)
Potassium: 3.9 mmol/L (ref 3.5–5.3)
Sodium: 141 mmol/L (ref 133–146)
eGFR AA CKD-EPI: >90 See note.
eGFR NONAA CKD-EPI: >90 See note.

## 2020-06-01 LAB — URINALYSIS-MACROSCOPIC W/REFLEX TO MICROSCOPIC
Bilirubin, UA: NEGATIVE
Blood, UA: NEGATIVE
Glucose, UA: NEGATIVE mg/dL
Ketones, UA: NEGATIVE mg/dL
Leukocytes, UA: NEGATIVE
Nitrite, UA: NEGATIVE
Protein, UA: NEGATIVE mg/dL
Specific Gravity, UA: 1.005 (ref 1.005–1.035)
Urobilinogen, UA: <2 mg/dL (ref 0.2–1.9)
pH, UA: 6 (ref 5.0–8.0)

## 2020-06-01 LAB — HEPATIC FUNCTION PANEL
ALT: 155 U/L (ref 7–52)
AST: 201 U/L (ref 13–39)
Albumin: 4.6 g/dL (ref 3.5–5.7)
Alkaline Phosphatase: 89 U/L (ref 36–125)
Bilirubin, Direct: 0.1 mg/dL (ref 0.0–0.4)
Bilirubin, Indirect: 0.3 mg/dL (ref 0.0–1.1)
Total Bilirubin: 0.4 mg/dL (ref 0.0–1.5)
Total Protein: 8.5 g/dL (ref 6.4–8.9)

## 2020-06-01 LAB — CBC
Hematocrit: 44.3 % (ref 38.5–50.0)
Hemoglobin: 14.7 g/dL (ref 13.2–17.1)
MCH: 28.7 pg (ref 27.0–33.0)
MCHC: 33.1 g/dL (ref 32.0–36.0)
MCV: 86.7 fL (ref 80.0–100.0)
MPV: 8.5 fL (ref 7.5–11.5)
Platelets: 237 10*3/uL (ref 140–400)
RBC: 5.11 10*6/uL (ref 4.20–5.80)
RDW: 17 % (ref 11.0–15.0)
WBC: 5.9 10*3/uL (ref 3.8–10.8)

## 2020-06-01 LAB — URINE DRUG SCREEN WITHOUT CONFIRMATION, STAT
Amphetamine, 500 ng/mL Cutoff: NEGATIVE
Barbiturates UR, 300  ng/mL Cutoff: NEGATIVE
Benzodiazepines UR, 300 ng/mL Cutoff: NEGATIVE
Buprenorphine, 5 ng/mL Cutoff: NEGATIVE
Cocaine UR, 300 ng/mL Cutoff: POSITIVE
Fentanyl, 2 ng/mL Cutoff: POSITIVE
Methadone, UR, 300 ng/mL Cutoff: NEGATIVE
Opiates UR, 300 ng/mL Cutoff: NEGATIVE
Oxycodone, 100 ng/mL Cutoff: NEGATIVE
THC UR, 50 ng/mL Cutoff: NEGATIVE
Tricyclic Antidepressants, 300 ng/mL Cutoff: NEGATIVE

## 2020-06-01 LAB — DIFFERENTIAL
Basophils Absolute: 30 /uL (ref 0–200)
Basophils Relative: 0.5 % (ref 0.0–1.0)
Eosinophils Absolute: 106 /uL (ref 15–500)
Eosinophils Relative: 1.8 % (ref 0.0–8.0)
Lymphocytes Absolute: 1988 /uL (ref 850–3900)
Lymphocytes Relative: 33.7 % (ref 15.0–45.0)
Monocytes Absolute: 572 /uL (ref 200–950)
Monocytes Relative: 9.7 % (ref 0.0–12.0)
Neutrophils Absolute: 3204 /uL (ref 1500–7800)
Neutrophils Relative: 54.3 % (ref 40.0–80.0)
nRBC: 0 /100 WBC (ref 0–0)

## 2020-06-01 LAB — HIGH SENSITIVITY TROPONIN: High Sensitivity Troponin: <3 ng/L (ref 0–20)

## 2020-06-01 LAB — LIPASE: Lipase: 29 U/L (ref 4–82)

## 2020-06-01 LAB — ETHANOL, SERUM: Ethanol: 315 mg/dL (ref 0–10)

## 2020-06-01 MED ORDER — ondansetron (ZOFRAN) injection 4 mg
4 | Freq: Once | INTRAMUSCULAR | Status: AC
Start: 2020-06-01 — End: 2020-06-01
  Administered 2020-06-01: 17:00:00 4 mg via INTRAVENOUS

## 2020-06-01 MED ORDER — sodium chloride 0.9 % 1,000 mL IV fluid
Freq: Once | INTRAVENOUS | Status: AC
Start: 2020-06-01 — End: 2020-06-01
  Administered 2020-06-01: 17:00:00 via INTRAVENOUS

## 2020-06-01 MED ORDER — diazePAM (VALIUM) tablet 10 mg
5 | Freq: Once | ORAL | Status: AC
Start: 2020-06-01 — End: 2020-06-01
  Administered 2020-06-01: 17:00:00 10 mg via ORAL

## 2020-06-01 MED ORDER — acetaminophen (TYLENOL) tablet 975 mg
325 | Freq: Once | ORAL | Status: AC
Start: 2020-06-01 — End: 2020-06-01
  Administered 2020-06-01: 17:00:00 975 mg via ORAL

## 2020-06-01 MED FILL — ONDANSETRON HCL (PF) 4 MG/2 ML INJECTION SOLUTION: 4 4 mg/2 mL | INTRAMUSCULAR | Qty: 2

## 2020-06-01 MED FILL — DIAZEPAM 5 MG TABLET: 5 5 MG | ORAL | Qty: 2

## 2020-06-01 MED FILL — TYLENOL 325 MG TABLET: 325 325 mg | ORAL | Qty: 3

## 2020-06-01 NOTE — Unmapped (Signed)
[facility] ED Note    Date of service:  06/01/2020    Reason for Visit: Loss of Consciousness (found unresponsive on a bus, probable ETOH withdrawl. Last drink last evening. Hx of alcohol withdrawl seizures. Pt was incontinent.)      Patient History     HPI:  Alexander [name] is a 34 y.o. male with PMHx of anxiety, bipolar disorder, polysubstance abuse, HIV (CD4 30) who presents with a chief complaint of LOC.     Patient reports that he was sleeping in a bus stop.  Per report, he was found unresponsive with urinary incontinence.  Patient does report an episode of incontinence and believes that he may have had a seizure.  He reports that he has a history of alcohol withdrawal seizures.  In addition, he has multiple other complaints including chest pain, liver pain, and kidney pain.  He describes this kidney pain as bilateral lower back pain without midline spinal tenderness.  He denies any numbness or weakness.  He states that he last drank alcohol late last evening and used fentanyl yesterday.  He reports that he feels he is in alcohol withdrawal.  Patient has had multiple recent ED visits and admissions for the same complaints and often leaves AGAINST MEDICAL ADVICE.    Aside from the above, patient denies any aggravating or alleviating factors or associated symptoms.       Past Medical History:   Diagnosis Date   ??? Anxiety    ??? Bipolar disorder (CMS Dx)    ??? Bronchitis    ??? Depression    ??? Drug abuse and dependence (CMS Dx)    ??? H/O tooth extraction    ??? Hepatitis C    ??? HIV infection (CMS Dx)    ??? Liver disease Hep C   ??? PTSD (post-traumatic stress disorder)    ??? Substance induced mood disorder (CMS Dx)    ??? Thyroid disease        Past Surgical History:   Procedure Laterality Date   ??? MANDIBLE SURGERY         Alexander [name]  reports that he has been smoking cigarettes. He has a 11.00 pack-year smoking history. He has never used smokeless tobacco. He reports current alcohol use. He reports current drug  use. Drugs: Heroin, Other-see comments, Marijuana, and Cocaine.    Previous Medications    FOLIC ACID (FOLVITE) 1 MG TABLET        ONDANSETRON (ZOFRAN ODT) 4 MG DISINTEGRATING TABLET    Take 1 tablet (4 mg total) by mouth every 8 hours as needed for Nausea.    TRIUMEQ 600-50-300 MG TAB    TAKE ONE TABLET BY MOUTH DAILY       Allergies:   Allergies as of 06/01/2020   ??? (No Known Allergies)       Review of Systems     ROS:  Positive for chest pain, incontinence, abdominal pain, seizure, vomiting.  Negative for fever, numbness, weakness.  All other systems reviewed are negative except as mentioned in HPI.      Physical Exam     ED Triage Vitals [06/01/20 1236]   Vital Signs Group      Temp 98.2 ??F (36.8 ??C)      Temp Source Oral      Heart Rate 84      Heart Rate Source Monitor      Resp 20      SpO2 99 %  BP (!) 144/93      MAP (mmHg) 105      BP Location Right arm      BP Method Automatic      Patient Position Sitting   SpO2 99 %   O2 Device None (Room air)       General: Resting comfortably, in no acute distress.    HEENT:  Normocephalic, atraumatic. Mucous membranes are moist.     Eyes: Anicteric.    Neck:  Supple, trachea midline.     Pulmonary:   Lungs clear to auscultation bilaterally. No increased work of breathing.     Cardiac:  Regular rate and rhythm. Normal S1 and S2. No murmurs/rubs/gallops.    Abdomen:  Soft, non-distended.  Subjective diffuse tenderness to palpation without rebound or guarding, distractible.    Extremities: No extremity deformity, swelling or tenderness appreciated.  No midline spinal tenderness.    Skin:  No rashes or bruising.    Neuro:  Alert and oriented x3. Speech normal. Normal finger-to-nose bilaterally.  Intact strength and sensation of bilateral upper and lower extremities.  Normal gait. No tremor.     Psych:  Mood and affect appropriate for situation.     Diagnostic Studies     Labs:    Please see electronic medical record for any tests performed in the ED      Radiology:    Please see electronic medical record for any tests performed in the ED    EKG:    Indication: chest pain   Rate: 71  Rhythm: NSR   Interval: PR 124, QRS 90, QTc 410  Axis: normal   ST Segment Change: no elevations or depressions    and Comparison to prior EKG: stable    Emergency Department Procedures     Procedures  None    ED Course and MDM     Alexander [name] is a 34 y.o. male with a history and presentation as described above in HPI.  The patient was evaluated by myself, the ED Attending Physician, Dr. Urbano Heir. All management and disposition plans were discussed and agreed upon. Appropriate labs and diagnostic studies were reviewed as they were made available. Pertinent laboratory studies in medical decision making are listed below.     Upon presentation, the patient was resting comfortably and in no acute distress.  He was afebrile and hemodynamically stable, though mildly hypertensive to 140s over 90s.  Physical exam is overall unremarkable.  Cardiac exam with regular rate and rhythm, lungs are clear.  He endorses tenderness to palpation diffusely throughout the abdomen, but his exam is overall benign.  His neurologic exam is nonfocal.  He has no tremor and does not appear anxious.  He has no midline spinal tenderness.  CIWA was 10.  He was given 10 mg of p.o. Valium for this.  He was also given a liter of IV fluids.     EKG showed normal sinus rhythm without ST elevations or depressions.  I reviewed his labs, which are notable for ethanol of 315, AST of 201 and ALT of 155 (previously elevated to this level, consistent with his known hepatitis C).  Labs are otherwise unremarkable.  High-sensitivity troponin is less than 3.  Urine is noninfectious. UDS positive for cocaine and fentanyl. CXR showed no acute cardiopulmonary abnormality.     On my reassessment, the patient is resting comfortably. CIWA is 7 (mostly subjective points).  Patient overall appears well.  He does not appear to be in  acute alcohol withdrawal. He is not treumulous, significantly hypertensive or tachycardic. His neuro exam is overall reassuring and nonfocal.  He was not witnessed with seizure activity and I believe alcohol withdrawal seizure is less likely given his current exam and ethanol level of 315.  Additionally, syncopal event seems unlikely.  Patient does not remember passing out and endorses that he was sleeping.  His syncope work-up is reassuring.    I feel he is safe for discharge at this time.  He was given strict return precautions and was agreeable to the plan.      Consults:  None    Summary of Treatment in ED:    Medications   sodium chloride 0.9 % 1,000 mL IV fluid ( Intravenous Stopped 06/01/20 1406)   ondansetron (ZOFRAN) injection 4 mg (4 mg Intravenous Given 06/01/20 1259)   acetaminophen (TYLENOL) tablet 975 mg (975 mg Oral Given 06/01/20 1259)   diazePAM (VALIUM) tablet 10 mg (10 mg Oral Given 06/01/20 1259)         Impression     1. Alcohol use          Olegario Messier, MD  Resident  06/01/20 [phone]

## 2020-06-01 NOTE — Unmapped (Signed)
MD at bedside.

## 2020-06-01 NOTE — Unmapped (Signed)
ED Attending Attestation Note    Date of service:  06/01/2020    This patient was seen by the resident physician.  I have seen and examined the patient, agree with the workup, evaluation, management and diagnosis. The care plan has been discussed and I concur.  I have reviewed the ECG and concur with the resident's interpretation.    My assessment reveals a 34 y.o. male who presents due to sleeping at a bus stop.  The patient has a history of chronic substance abuse and alcohol abuse.  He was found sleeping at a bus stop and was brought to the emergency department.  His ethanol level is significantly elevated.  He wakes up and answers questions appropriately.  He has no tachycardia, hypertension, tremors, or signs of alcohol withdrawal.  No signs of trauma.    Jules Husbands, MD

## 2020-06-01 NOTE — Unmapped (Signed)
34 y.o. male presents to clinic by EMS for symptoms of alcohol withdrawal. EMS states pt was found unconscious on bus this morning. Pt states he was sleeping. Complaints of headache, nausea, agitation. No visual tremors. Denies auditory/visual hallucinations. Pt awake and alert. At end of conversation, pt had RN used insulin syringe. States taking maybe fentanyl this AM. Was unsure what it actually was.

## 2020-06-01 NOTE — Unmapped (Signed)
D/C instructions reviewed. All belongings sent with pt. Pt leaving ED in stable condition. Ambulatory.

## 2020-06-01 NOTE — Unmapped (Signed)
You were seen today in the emergency department for alcohol use.  Your work-up that was performed here today is reassuring.  You should follow up with your primary care doctor regarding your visit today.    Return to the emergency department for passing out, seizures, numbness or weakness, fever, or other concerning symptoms.

## 2020-06-02 ENCOUNTER — Inpatient Hospital Stay
Admit: 2020-06-02 | Discharge: 2020-06-03 | Disposition: A | Discharge status: Home / Self Care | Payer: PRIVATE HEALTH INSURANCE

## 2020-06-02 DIAGNOSIS — F10129 Alcohol abuse with intoxication, unspecified: Secondary | ICD-10-CM

## 2020-06-02 MED ORDER — folic acid (FOLVITE) tablet 1 mg
1 | Freq: Once | ORAL | Status: AC
Start: 2020-06-02 — End: 2020-06-02
  Administered 2020-06-03: 1 mg via ORAL

## 2020-06-02 MED ORDER — multivitamin with folic acid tablet 1 tablet
400 | Freq: Once | ORAL | Status: AC
Start: 2020-06-02 — End: 2020-06-02
  Administered 2020-06-03: 1 via ORAL

## 2020-06-02 MED ORDER — lactated Ringers 1,000 mL IV fluid
Freq: Once | INTRAVENOUS | Status: AC
Start: 2020-06-02 — End: 2020-06-02
  Administered 2020-06-03: via INTRAVENOUS

## 2020-06-02 MED ORDER — thiamine mononitrate (VITAMIN B-1) tablet 100 mg
100 | Freq: Once | ORAL | Status: AC
Start: 2020-06-02 — End: 2020-06-02
  Administered 2020-06-03: 100 mg via ORAL

## 2020-06-02 MED ORDER — thiamine mononitrate (VITAMIN B-1) tablet 100 mg
100 | Freq: Every day | ORAL | Status: AC
Start: 2020-06-02 — End: 2020-06-02

## 2020-06-02 MED FILL — VITAMIN B-1 (MONONITRATE) 100 MG TABLET: 100 100 mg | ORAL | Qty: 1

## 2020-06-02 MED FILL — FOLIC ACID 1 MG TABLET: 1 1 MG | ORAL | Qty: 1

## 2020-06-02 MED FILL — THERA 400 MCG TABLET: 400 400 mcg | ORAL | Qty: 1

## 2020-06-02 NOTE — Unmapped (Signed)
ED Attending Attestation Note    This patient was seen by the advanced practice provider. I have seen and examined the patient, agree with the workup, evaluation, management and diagnosis. The care plan has been discussed and I concur. I was present for any procedures performed in the APP's note and have made edits to the note where appropriate.    Assessment reveals 34 y.o. male with history of alcohol abuse, bipolar disorder, multiple other comorbidities presenting to the emergency department today for possible seizure.  He is concerned about alcohol withdrawal, however no vital sign abnormalities, tremulousness, altered mental status, or other objective signs of withdrawal.  Feel it is unlikely he has recently had a seizure given these findings.

## 2020-06-02 NOTE — ED Triage Notes (Signed)
Patient seen 06/01/2020 as well for alcohol use as well. Patient arrived to ED to speak with a EIP/chemical dependency counselor to obtain rehab services.

## 2020-06-02 NOTE — ED Notes (Signed)
Patient returned at this time. Patient placed back into lobby. Chemical dependency counselor notified and to see patient.

## 2020-06-02 NOTE — Unmapped (Signed)
Pt complains of seizure this AM. Has tremors at this time. States he drinks ETOH daily. Seen for similar symptoms yesterday. A&Ox4. Awake and alert. Denies further complaints. Denies CP, SOB, cough, fever, chills.

## 2020-06-02 NOTE — Unmapped (Signed)
[facility] Emergency Department Note    Reason for Visit: Alcoholic Seizure and Tremors      Patient History     HPI: Alexander [name] is a 34 y.o. male with a PMH as noted below presenting with complaints of seizure. Patient has a history of alcohol abuse/withdrawal. He reports a seizure this morning after being discharged from the emergency department. He was seen yesterday for the same complaints and was not noted to be in acute alcohol withdrawal. Patient reports he is very interested in substance abuse treatment and does not want to go back to the streets tonight. Patients last drink was this morning. He endorses chills, shakiness, nausea, and generalized abdominal discomfort. Endorses urinary incontinence without tongue biting. Patient denies neck or back pain, fever, headache, vision changes, hearing changes, chest pain, shortness of breath, vomiting, diarrhea, or constipation.    With the exception of the above, there are no aggravating or alleviating factors.    Medical History:   Past Medical History:   Diagnosis Date   ??? Anxiety    ??? Bipolar disorder (CMS Dx)    ??? Bronchitis    ??? Depression    ??? Drug abuse and dependence (CMS Dx)    ??? H/O tooth extraction    ??? Hepatitis C    ??? HIV infection (CMS Dx)    ??? Liver disease Hep C   ??? PTSD (post-traumatic stress disorder)    ??? Substance induced mood disorder (CMS Dx)    ??? Thyroid disease        Surgical History:   Past Surgical History:   Procedure Laterality Date   ??? MANDIBLE SURGERY         Medications: No current facility-administered medications for this encounter.    Current Outpatient Medications:   ???  folic acid (FOLVITE) 1 MG tablet, , Disp: , Rfl:   ???  ondansetron (ZOFRAN ODT) 4 MG disintegrating tablet, Take 1 tablet (4 mg total) by mouth every 8 hours as needed for Nausea., Disp: 20 tablet, Rfl: 0  ???  TRIUMEQ 600-50-300 mg Tab, TAKE ONE TABLET BY MOUTH DAILY, Disp: 30 tablet, Rfl: 1    Social History: Alexander [name]  reports that he has been  smoking cigarettes. He has a 11.00 pack-year smoking history. He has never used smokeless tobacco. He reports current alcohol use. He reports current drug use. Drugs: Heroin, Other-see comments, Marijuana, and Cocaine.    Allergies:   Allergies as of 06/02/2020   ??? (No Known Allergies)        Review of Systems     ROS: Pertinent positive and negative findings as documented in the HPI, otherwise all other systems were reviewed and were negative.    Physical Exam     Vitals:    06/02/20 1517 06/02/20 1946 06/02/20 1949   BP: 119/71 129/74 129/74   BP Location: Right arm     Patient Position: Sitting     Pulse: 87 88 88   Resp: 14 18 18    Temp: 98.4 ??F (36.9 ??C)     TempSrc: Oral     SpO2: 96% 98%        General: 34 y.o. male in no apparent distress, well developed, well nourished, non-toxic appearance.  Appears disheveled with poor hygiene.    HEENT: Atraumatic, normocephalic. EOMs intact.     Neck:  Full range of motion.  No midline or paraspinal tenderness to palpation.    Chest/pulm: Respiratory rate normal. Speaks  in complete sentences, no respiratory distress, lungs CTA bilaterally, no wheezes, rales, rhonchi.     Cardiovascular: Heart rate normal. RRR, no murmurs, rubs, gallops appreciated.     Abdomen: No gross distension. Soft, non-tender, non-peritoneal. No suprapubic or CVAT.     GU: Deferred.     Musculoskeletal: Ambulates without difficulty. No evident long bone or joint deformity.  No lower extremity edema.    Neuro: A&O x 4.  Normal speech without dysarthria or aphasia. Moves all extremities spontaneously and symmetrically. Movements normal without ataxia.     Skin: Warm, dry. No obvious rashes, petechiae, or purpura.     Psych: Appropriate mood and affect, normal interaction.     Diagnostic Studies     Labs: Please see electronic medical record for any tests performed in the ED   Labs Reviewed   ETHANOL, SERUM - Abnormal; Notable for the following components:       Result Value    Ethanol 300 (*)      All other components within normal limits   BASIC METABOLIC PANEL - Abnormal; Notable for the following components:    BUN 3 (*)     Glucose 147 (*)     All other components within normal limits   CBC - Abnormal; Notable for the following components:    RDW 17.3 (*)     All other components within normal limits   HEPATIC FUNCTION PANEL - Abnormal; Notable for the following components:    AST 157 (*)     ALT 130 (*)     All other components within normal limits   DIFFERENTIAL   LIPASE       Imaging Studies / Radiology: Please see electronic medical record for any tests performed in the ED  No orders to display       EKG:  None    Emergency Department Procedures     None     ED Course     ED Medications Administered:  Medications   lactated Ringers 1,000 mL IV fluid ( Intravenous New Bag 06/02/20 2028)   folic acid (FOLVITE) tablet 1 mg (1 mg Oral Given 06/02/20 2028)     And   multivitamin with folic acid tablet 1 tablet (1 tablet Oral Given 06/02/20 2028)     And   thiamine mononitrate (VITAMIN B-1) tablet 100 mg (100 mg Oral Given 06/02/20 2028)       Consults:  None     MDM     Vitals:    06/02/20 1517 06/02/20 1946 06/02/20 1949   BP: 119/71 129/74 129/74   BP Location: Right arm     Patient Position: Sitting     Pulse: 87 88 88   Resp: 14 18 18    Temp: 98.4 ??F (36.9 ??C)     TempSrc: Oral     SpO2: 96% 98%        Briefly, Alexander [name] is a 34 y.o. male who presents to the emergency department with complaints of seizure and alcohol withdrawal. On presentation, patient is well-appearing and in no acute distress. Patient is afebrile with normal VS.    Physical exam was unremarkable.  There is no evidence of tongue biting to suggest seizure.  The patient had been in the emergency department lobby for several hours and was not noted to have any seizure activity.  Patient's ethanol of 300.  CBC without leukocytosis or anemia.  BMP without electrolyte abnormality or renal dysfunction.  Transaminitis improving from  labs  obtained yesterday.  Lipase within normal limits.  Given the above the patient does not appear to be in acute alcohol withdrawal.  Addiction services was involved and will be able to help him get into [facility] tomorrow morning for rehab.  The patient was offered that he could wait to the emergency department lobby overnight.  The patient was not happy with this and stated he could not wait until tomorrow morning.  We explained we cannot possibly make this process any faster but the patient continued to insist that he would rather just leave.    At this point in time, patient is stable for discharge. Patient given strict return precautions as outlined in the AVS. Patient was agreeable and understanding to this plan of care. Prior to discharge, patient was ambulatory and PO tolerant.    The patient was seen and evaluated by the attending physician who agreed with the assessment and plan.  The patient and / or the family were informed of the results of any tests, a time was given to answer questions, a plan was proposed and they agreed with plan.    This note was dictated using voice-recognition software, which occasionally leads to inadvertent typographic errors.    Clinical Impression     1. Alcohol use        Disposition     Discharged from the ED. See AVS for prescriptions, followup, and discharge instructions.     DISCHARGE MEDICATIONS:  Discharge Medication List as of 06/02/2020  9:59 PM          PATIENT REFERRED TO:  Herbert Pun, MD  [address]  [place] [address]  [phone]            Kolby Myung Rosemarie Ax, PA-C  Department of Emergency Medicine       [place], [place]  06/02/20 [phone]

## 2020-06-02 NOTE — Unmapped (Signed)
Bed: I11U  Expected date: 06/02/20  Expected time: 7:41 PM  Means of arrival:   Comments:

## 2020-06-02 NOTE — Unmapped (Signed)
SUD Team made aware of this patient's presence in the emergency department via:  Informed by hospital staff    EIP PRS approached Pt to per request. Peer support was offered and accepted. Pt states they were unable to get into [facility] due to losing there phone. Pt states they are feeling unwell and have had seizures recently. Pt is interested in getting into [facility] if still able.     This Clinical research associate can not confirm any information or bed availability with [facility] until morning when intake returns. E[facility] SUD Team will continue to monitor and re-engage with this Pt once applicable.      Robie Ridge, PRS  Linkage Coordinator/Peer Recovery Specialist  Early Intervention Program ([facility]) - SUD Team  Dept. of Emergency Medicine  [phone]

## 2020-06-02 NOTE — ED Notes (Signed)
Patient leaving in stable condition. NAD. IV removed prior to discharge. Ambulatory.

## 2020-06-02 NOTE — Research Notes (Signed)
EIP approached pt to assess needs. Necessary resources and program information provided.

## 2020-06-02 NOTE — Unmapped (Signed)
Please follow-up tomorrow morning with addiction services to assist in placement at the [facility].      If you begin experiencing new loss of vision, fever, headache, chest pain, shortness of breath, or other concerning symptoms please return to the emergency department for further evaluation.

## 2020-06-03 LAB — LIPASE: Lipase: 24 U/L (ref 4–82)

## 2020-06-03 LAB — CBC
Hematocrit: 42.1 % (ref 38.5–50.0)
Hemoglobin: 13.8 g/dL (ref 13.2–17.1)
MCH: 28.8 pg (ref 27.0–33.0)
MCHC: 32.9 g/dL (ref 32.0–36.0)
MCV: 87.7 fL (ref 80.0–100.0)
MPV: 8.5 fL (ref 7.5–11.5)
Platelets: 209 10*3/uL (ref 140–400)
RBC: 4.8 10*6/uL (ref 4.20–5.80)
RDW: 17.3 % (ref 11.0–15.0)
WBC: 8.4 10*3/uL (ref 3.8–10.8)

## 2020-06-03 LAB — DIFFERENTIAL
Basophils Absolute: 25 /uL (ref 0–200)
Basophils Relative: 0.3 % (ref 0.0–1.0)
Eosinophils Absolute: 50 /uL (ref 15–500)
Eosinophils Relative: 0.6 % (ref 0.0–8.0)
Lymphocytes Absolute: 2192 /uL (ref 850–3900)
Lymphocytes Relative: 26.1 % (ref 15.0–45.0)
Monocytes Absolute: 546 /uL (ref 200–950)
Monocytes Relative: 6.5 % (ref 0.0–12.0)
Neutrophils Absolute: 5586 /uL (ref 1500–7800)
Neutrophils Relative: 66.5 % (ref 40.0–80.0)
nRBC: 0 /100{WBCs} (ref 0–0)

## 2020-06-03 LAB — BASIC METABOLIC PANEL
Anion Gap: 10 mmol/L (ref 3–16)
BUN: 3 mg/dL (ref 7–25)
CO2: 26 mmol/L (ref 21–33)
Calcium: 9.2 mg/dL (ref 8.6–10.3)
Chloride: 99 mmol/L (ref 98–110)
Creatinine: 0.76 mg/dL (ref 0.60–1.30)
Glucose: 147 mg/dL (ref 70–100)
Osmolality, Calculated: 279 mOsm/kg (ref 278–305)
Potassium: 4.1 mmol/L (ref 3.5–5.3)
Sodium: 135 mmol/L (ref 133–146)
eGFR AA CKD-EPI: >90 See note.
eGFR NONAA CKD-EPI: >90 See note.

## 2020-06-03 LAB — HEPATIC FUNCTION PANEL
ALT: 130 U/L — ABNORMAL HIGH (ref 7–52)
AST: 157 U/L — ABNORMAL HIGH (ref 13–39)
Albumin: 4.4 g/dL (ref 3.5–5.7)
Alkaline Phosphatase: 90 U/L (ref 36–125)
Bilirubin, Direct: 0.2 mg/dL (ref 0.0–0.4)
Bilirubin, Indirect: 0.3 mg/dL (ref 0.0–1.1)
Total Bilirubin: 0.5 mg/dL (ref 0.0–1.5)
Total Protein: 7.9 g/dL (ref 6.4–8.9)

## 2020-06-03 LAB — ETHANOL, SERUM: Ethanol: 300 mg/dL — ABNORMAL HIGH (ref 0–10)

## 2020-06-05 ENCOUNTER — Inpatient Hospital Stay
Admit: 2020-06-05 | Discharge: 2020-06-05 | Disposition: A | Discharge status: Home / Self Care | Payer: MEDICAID | Attending: Emergency Medicine

## 2020-06-05 ENCOUNTER — Emergency Department: Admit: 2020-06-05 | Payer: MEDICAID

## 2020-06-05 DIAGNOSIS — F10120 Alcohol abuse with intoxication, uncomplicated: Secondary | ICD-10-CM

## 2020-06-05 LAB — URINALYSIS WITH REFLEX TO CULTURE
Bilirubin Urine: NEGATIVE
Blood, Urine: NEGATIVE
Glucose, Ur: NEGATIVE mg/dL
Ketones, Urine: NEGATIVE mg/dL
Leukocyte Esterase, Urine: NEGATIVE
Nitrite, Urine: NEGATIVE
Protein, UA: NEGATIVE mg/dL
Specific Gravity, UA: 1.006 (ref 1.005–1.030)
Urobilinogen, Urine: 1 E.U./dL (ref <2.0)
pH, UA: 6.5 (ref 5.0–8.0)

## 2020-06-05 LAB — CBC WITH AUTO DIFFERENTIAL
Basophils %: 0.3 %
Basophils Absolute: 0 10*3/uL (ref 0.0–0.2)
Eosinophils %: 0.2 %
Eosinophils Absolute: 0 10*3/uL (ref 0.0–0.6)
Hematocrit: 47.4 % (ref 40.5–52.5)
Hemoglobin: 15.8 g/dL (ref 13.5–17.5)
Lymphocytes %: 14.5 %
Lymphocytes Absolute: 1.2 10*3/uL (ref 1.0–5.1)
MCH: 28.8 pg (ref 26.0–34.0)
MCHC: 33.3 g/dL (ref 31.0–36.0)
MCV: 86.4 fL (ref 80.0–100.0)
MPV: 8.6 fL (ref 5.0–10.5)
Monocytes %: 7.6 %
Monocytes Absolute: 0.6 10*3/uL (ref 0.0–1.3)
Neutrophils %: 77.4 %
Neutrophils Absolute: 6.5 10*3/uL (ref 1.7–7.7)
Platelets: 207 10*3/uL (ref 135–450)
RBC: 5.48 M/uL (ref 4.20–5.90)
RDW: 17.1 % — ABNORMAL HIGH (ref 12.4–15.4)
WBC: 8.4 10*3/uL (ref 4.0–11.0)

## 2020-06-05 LAB — COMPREHENSIVE METABOLIC PANEL W/ REFLEX TO MG FOR LOW K
ALT: 132 U/L — ABNORMAL HIGH (ref 10–40)
AST: 213 U/L — ABNORMAL HIGH (ref 15–37)
Albumin/Globulin Ratio: 1.1 (ref 1.1–2.2)
Albumin: 4.2 g/dL (ref 3.4–5.0)
Alkaline Phosphatase: 142 U/L — ABNORMAL HIGH (ref 40–129)
Anion Gap: 17 — ABNORMAL HIGH (ref 3–16)
BUN: 5 mg/dL — ABNORMAL LOW (ref 7–20)
CO2: 24 mmol/L (ref 21–32)
Calcium: 9 mg/dL (ref 8.3–10.6)
Chloride: 98 mmol/L — ABNORMAL LOW (ref 99–110)
Creatinine: 0.6 mg/dL — ABNORMAL LOW (ref 0.9–1.3)
GFR African American: >60 (ref >60)
GFR Non-African American: >60 (ref >60)
Globulin: 4 g/dL
Glucose: 106 mg/dL — ABNORMAL HIGH (ref 70–99)
Potassium reflex Magnesium: 3.9 mmol/L (ref 3.5–5.1)
Sodium: 139 mmol/L (ref 136–145)
Total Bilirubin: 0.5 mg/dL (ref 0.0–1.0)
Total Protein: 8.2 g/dL (ref 6.4–8.2)

## 2020-06-05 LAB — EKG 12-LEAD
Atrial Rate: 94 {beats}/min
P Axis: 62 degrees
P-R Interval: 118 ms
Q-T Interval: 342 ms
QRS Duration: 82 ms
QTc Calculation (Bazett): 427 ms
R Axis: 38 degrees
T Axis: 42 degrees
Ventricular Rate: 94 {beats}/min

## 2020-06-05 LAB — URINE DRUG SCREEN
Amphetamine Screen, Ur: NEGATIVE
Barbiturate Screen, Ur: NEGATIVE (ref <200)
Benzodiazepine Screen, Urine: NEGATIVE (ref <200)
Cannabinoid Scrn, Ur: NEGATIVE (ref <50)
Cocaine Metabolite Screen, Urine: POSITIVE — AB (ref <300)
Methadone Screen, Urine: NEGATIVE (ref <300)
Opiate Screen, Urine: NEGATIVE (ref <300)
Oxycodone Urine: NEGATIVE (ref <100)
Phencyclidine (PCP), Screen, Urine: NEGATIVE (ref <25)
Propoxyphene Scrn, Ur: NEGATIVE (ref <300)
pH, UA: 6.5

## 2020-06-05 LAB — LIPASE: Lipase: 110 U/L — ABNORMAL HIGH (ref 13.0–60.0)

## 2020-06-05 LAB — ETHANOL: Ethanol Lvl: 289 mg/dL

## 2020-06-05 LAB — ACETAMINOPHEN LEVEL: Acetaminophen Level: <5 ug/mL — ABNORMAL LOW (ref 10–30)

## 2020-06-05 LAB — TROPONIN: Troponin: <0.01 ng/mL (ref <0.01)

## 2020-06-05 LAB — SALICYLATE LEVEL: Salicylate Lvl: <0.3 mg/dL — ABNORMAL LOW (ref 15.0–30.0)

## 2020-06-05 MED ORDER — ONDANSETRON HCL 4 MG/2ML IJ SOLN
4 MG/2ML | Freq: Once | INTRAMUSCULAR | Status: AC
Start: 2020-06-05 — End: 2020-06-05
  Administered 2020-06-05: 16:00:00 4 mg via INTRAVENOUS

## 2020-06-05 MED ORDER — PANTOPRAZOLE SODIUM 40 MG IV SOLR
40 MG | Freq: Once | INTRAVENOUS | Status: AC
Start: 2020-06-05 — End: 2020-06-05
  Administered 2020-06-05: 16:00:00 40 mg via INTRAVENOUS

## 2020-06-05 MED ORDER — FOLIC ACID 5 MG/ML IJ SOLN
5 MG/ML | Freq: Once | INTRAMUSCULAR | Status: AC
Start: 2020-06-05 — End: 2020-06-05
  Administered 2020-06-05: 17:00:00 via INTRAVENOUS

## 2020-06-05 MED ORDER — LORAZEPAM 2 MG/ML IJ SOLN
2 MG/ML | Freq: Once | INTRAMUSCULAR | Status: AC
Start: 2020-06-05 — End: 2020-06-05
  Administered 2020-06-05: 16:00:00 1 mg via INTRAVENOUS

## 2020-06-05 MED FILL — PROTONIX 40 MG IV SOLR: 40 mg | INTRAVENOUS | Qty: 40

## 2020-06-05 MED FILL — FOLIC ACID 5 MG/ML IJ SOLN: 5 mg/mL | INTRAMUSCULAR | Qty: 0.2

## 2020-06-05 MED FILL — ONDANSETRON HCL 4 MG/2ML IJ SOLN: 4 MG/2ML | INTRAMUSCULAR | Qty: 2

## 2020-06-05 MED FILL — LORAZEPAM 2 MG/ML IJ SOLN: 2 mg/mL | INTRAMUSCULAR | Qty: 1

## 2020-06-05 NOTE — ED Provider Notes (Signed)
I independently performed a history and physical on Brett [name].   All diagnostic, treatment, and disposition decisions were made by myself in conjunction with the advanced practice provider.     Briefly, this is a 34 y.o. male here for intoxication.  States that he is going into withdrawal from alcohol and fentanyl.  He states that he has not had any significant amount of fentanyl or alcohol in the last 24 hours.  He states that he feels tremulous and nauseous.  Per EMS report, patient tried to get on the bus today where he was acting intoxicated.  The bus driver refused to let him on the bus and police were called.  Police asked him if he would prefer to go to jail or to the hospital so he picked to the hospital.  Denies any SI or HI.    On exam patient is sleeping comfortably until I wake him up.  He has nonlabored breaths.  Cardiac regular rate and rhythm.  No abdominal tenderness.  Moves all 4 extremities to command.  No SI or HI.    EKG  The Ekg interpreted by me in the absence of a cardiologist shows.  normal sinus rhythm with a rate of 94  Axis is   Normal  QTc is  normal  Intervals and Durations are unremarkable.      No specific ST-T wave changes appreciated.  No evidence of acute ischemia.   No significant change from prior EKG dated 06/09/2014      XR CHEST PORTABLE   Final Result   No acute cardiopulmonary disease.           Labs Reviewed   CBC WITH AUTO DIFFERENTIAL - Abnormal; Notable for the following components:       Result Value    RDW 17.1 (*)     All other components within normal limits    Narrative:     Performed at:  [facility]  [address]  [place], [place] [address]   Phone [phone]   COMPREHENSIVE METABOLIC PANEL W/ REFLEX TO MG FOR LOW K - Abnormal; Notable for the following components:    Chloride 98 (*)     Anion Gap 17 (*)     Glucose 106 (*)     BUN 5 (*)     CREATININE 0.6 (*)     Alkaline Phosphatase 142 (*)     ALT 132 (*)     AST 213 (*)      All other components within normal limits    Narrative:     Performed at:  [facility]  [address],  [place], [place] [address]   Phone [phone]   LIPASE - Abnormal; Notable for the following components:    Lipase 110.0 (*)     All other components within normal limits    Narrative:     Performed at:  [facility]  [address]  [place], [place] [address]   Phone [phone]   URINE DRUG SCREEN - Abnormal; Notable for the following components:    Cocaine Metabolite Screen, Urine POSITIVE (*)     All other components within normal limits    Narrative:     Performed at:  [facility]  [address]  [place], [place] [address]   Phone [phone]   ACETAMINOPHEN LEVEL - Abnormal; Notable for the following components:  Acetaminophen Level <5 (*)     All other components within normal limits    Narrative:     Performed at:  [facility]  [address]  [place], [place] [address]   Phone [phone]   SALICYLATE LEVEL - Abnormal; Notable for the following components:    Salicylate, Serum <0.3 (*)     All other components within normal limits    Narrative:     Performed at:  [facility]  [address]  [place], [place] [address]   Phone [phone]   URINE RT REFLEX TO CULTURE    Narrative:     Performed at:  [facility]  [address]  [place], [place] [address]   Phone [phone]   ETHANOL    Narrative:     Performed at:  [facility]  [address],  [place], [place] [address]   Phone [phone]   TROPONIN    Narrative:     Performed at:  [facility]  [address]  [place], [place] [address]   Phone [phone]     Medications   sodium chloride 0.9 % 1,000 mL with folic acid 1 mg, adult multi-vitamin with vitamin k 10 mL, thiamine 300 mg ( Intravenous Stopped 06/05/20  1516)   ondansetron (ZOFRAN) injection 4 mg (4 mg Intravenous Given 06/05/20 1223)   pantoprazole (PROTONIX) injection 40 mg (40 mg Intravenous Given 06/05/20 1226)   LORazepam (ATIVAN) injection 1 mg (1 mg Intravenous Given 06/05/20 1224)     Patient observed in the emergency room until clinically sober.  He remained hemodynamically stable throughout ER course.  At this time he is asking for discharge home.  No SI or HI.  Did not appear decompensated from a psychiatric standpoint.  He has chronic transaminitis in the setting of daily alcohol use.  His abdomen is soft, nontender and benign.  At this time I believe he is stable for outpatient follow-up with his PCP and does not want any inpatient admission today.  He can return to the ER at anytime for new or worsening symptoms and is agreeable to plan.  Patient Referrals:  [facility]  [address]  [place] [address]  [phone]  Go to   As needed, If symptoms worsen      Discharge Medications:  Discharge Medication List as of 06/05/2020  4:27 PM          FINAL IMPRESSION  1. Acute alcoholic intoxication without complication (HCC)        Blood pressure (!) 138/91, pulse 76, resp. rate (!) 31, height 5\' 1"  (1.549 m), weight 153 lb (69.4 kg), SpO2 94 %.     For further details of Brett [name] emergency department encounter, please see documentation by advanced practice provider       Lajean Silvius, MD  06/05/20 2201

## 2020-06-05 NOTE — ED Provider Notes (Signed)
[facility] EMERGENCY DEPARTMENT  EMERGENCY DEPARTMENT ENCOUNTER        Pt Name: Brett [name]  MRN: [id number]  Birthdate 12/18/1985  Date of evaluation: 06/05/2020  Provider: Verdon Cummins, PA  PCP: No primary care provider on file.  Note Started: 12:16 PM EDT        I have seen and evaluated this patient with my supervising physician Raleigh Callas    CHIEF COMPLAINT       Chief Complaint   Patient presents with   ??? Alcohol Intoxication     used herion 2 dys ago, last drink this am, bus driver refused to allow pt onto bus due to alcohol intox, police called and gave pt choice between hospital or jail. pt reports shakes and cp       HISTORY OF PRESENT ILLNESS   (Location, Timing/Onset, Context/Setting, Quality, Duration, Modifying Factors, Severity, Associated Signs and Symptoms)  Note limiting factors.     Chief Complaint: Alcohol/Opiate Withdrawal     Brett [name] is a 34 y.o. male with past medical she of anxiety, depression, document HIV, documented hepatitis C, PTSD and hypertension who presents to the ED with complaint of alcohol/opiate withdrawal.  Patient states he has a longstanding history of alcohol and opiate abuse.  Patient states he last used fentanyl 2 days ago.  Patient is he last had alcohol this morning.  States he only had half a beer this morning.  Apparently was brought in by EMS after he attempted to get on a bus and the bus driver refused to allow him to get the bus secondary to some alcohol intoxication.  Police apparently were called and gave patient choice of going to the hospital or jail and he requested coming to the hospital.  Patient states he does feel shaky at this time.  States he is concerned he is going through withdrawal.  Patient states he was at a facility about 2-1/2 weeks ago for withdrawal and states he had to stay there for several days.  Patient states he has had seizures with withdrawal in the past.  Patient dates he was given medication to help with his  withdrawal but states has not picked them up.  Patient denies any vomiting but states he has had some nausea.  Denies any abdominal pain, urinary symptoms or changes in bowel movements.  Denies any other drug usage.  Denies any palpitations or shortness of breath but states he does have some chest pain.  Denies any pleuritic pain, headache, lightheadedness/dizziness or fall/injury.  Denies fever chills.  Denies any rashes or lesions.    Nursing Notes were all reviewed and agreed with or any disagreements were addressed in the HPI.    REVIEW OF SYSTEMS    (2-9 systems for level 4, 10 or more for level 5)     Review of Systems   Constitutional: Negative for activity change, appetite change, chills, diaphoresis, fatigue and fever.   Eyes: Negative for photophobia and visual disturbance.   Respiratory: Negative.  Negative for cough, chest tightness and shortness of breath.    Cardiovascular: Positive for chest pain. Negative for palpitations and leg swelling.   Gastrointestinal: Positive for nausea and vomiting. Negative for abdominal pain, constipation and diarrhea.   Genitourinary: Negative for decreased urine volume, difficulty urinating, dysuria, flank pain, frequency, hematuria and urgency.   Musculoskeletal: Negative for arthralgias, back pain, myalgias, neck pain and neck stiffness.   Skin: Negative for color change, pallor, rash and wound.  Neurological: Positive for tremors. Negative for dizziness, seizures, syncope, facial asymmetry, speech difficulty, weakness, light-headedness, numbness and headaches.       Positives and Pertinent negatives as per HPI. Except as noted above in the ROS, all other systems were reviewed and negative.       PAST MEDICAL HISTORY     Past Medical History:   Diagnosis Date   ??? Anxiety    ??? Depression    ??? Elevated LFTs    ??? Hepatitis C    ??? HIV (human immunodeficiency virus infection) (HCC)    ??? Hypertension    ??? PTSD (post-traumatic stress disorder)    ??? Thyroid disease           SURGICAL HISTORY     Past Surgical History:   Procedure Laterality Date   ??? MANDIBLE FRACTURE SURGERY           CURRENTMEDICATIONS       Discharge Medication List as of 06/05/2020  4:27 PM      CONTINUE these medications which have NOT CHANGED    Details   buprenorphine-naloxone (SUBOXONE) 8-2 MG SUBL SL tablet Place 2 tablets under the tongue.Marland KitchenHistorical Med      lisinopril (PRINIVIL;ZESTRIL) 5 MG tablet Take 1 tablet by mouth daily for 14 days, Disp-14 tablet, R-0Print      !! QUEtiapine (SEROQUEL) 100 MG tablet Take 1 tablet by mouth nightly, Disp-14 tablet, R-0Print      !! QUEtiapine (SEROQUEL) 25 MG tablet Take 1 tablet by mouth daily, Disp-14 tablet, R-0Print      ibuprofen (ADVIL;MOTRIN) 600 MG tablet Take 1 tablet by mouth every 6 hours as needed for Pain, Disp-40 tablet, R-0      cloNIDine (CATAPRES) 0.1 MG tablet Take 1 tablet by mouth 2 times daily, Disp-60 tablet, R-1      traZODone (DESYREL) 50 MG tablet Take 1 tablet by mouth nightly as needed for Sleep, Disp-30 tablet, R-0       !! - Potential duplicate medications found. Please discuss with provider.            ALLERGIES     Patient has no known allergies.    FAMILYHISTORY       Family History   Problem Relation Age of Onset   ??? Substance Abuse Mother    ??? Mental Illness Father    ??? Substance Abuse Father    ??? Mental Illness Brother    ??? Substance Abuse Brother           SOCIAL HISTORY       Social History     Tobacco Use   ??? Smoking status: Current Every Day Smoker     Packs/day: 2.00     Years: 12.00     Pack years: 24.00     Types: Cigarettes   ??? Smokeless tobacco: Never Used   Vaping Use   ??? Vaping Use: Never used   Substance Use Topics   ??? Alcohol use: Yes     Alcohol/week: 84.0 standard drinks     Types: 84 Cans of beer per week     Comment: 12 pk per day   ??? Drug use: Yes     Frequency: 14.0 times per week     Types: Marijuana     Comment: heroin       SCREENINGS             PHYSICAL EXAM    (up to 7 for level 4, 8 or  more for level 5)      ED Triage Vitals [06/05/20 1155]   BP Temp Temp src Pulse Resp SpO2 Height Weight   (!) 141/103 -- -- 96 14 97 % 5\' 1"  (1.549 m) 153 lb (69.4 kg)       Physical Exam  Constitutional:       General: He is not in acute distress.     Appearance: He is well-developed. He is not ill-appearing, toxic-appearing or diaphoretic.      Comments: Patient resting comfortably but easily arousable with verbal stimuli.  Patient disheveled and unkept in appearance.  Smells of alcohol.  Has some slurred speech.   HENT:      Head: Normocephalic and atraumatic.      Right Ear: External ear normal.      Left Ear: External ear normal.      Mouth/Throat:      Mouth: Mucous membranes are moist.      Pharynx: No oropharyngeal exudate or posterior oropharyngeal erythema.   Eyes:      General:         Right eye: No discharge.         Left eye: No discharge.      Extraocular Movements: Extraocular movements intact.      Conjunctiva/sclera: Conjunctivae normal.      Pupils: Pupils are equal, round, and reactive to light.   Cardiovascular:      Rate and Rhythm: Regular rhythm. Tachycardia present.      Pulses: Normal pulses.      Heart sounds: Normal heart sounds. No murmur heard.   No friction rub. No gallop.       Comments: 2+ radial pulse bilaterally.  No pedal edema.  No calf tenderness.  No JVD.  Pulmonary:      Effort: Pulmonary effort is normal. No respiratory distress.      Breath sounds: Normal breath sounds. No stridor. No wheezing, rhonchi or rales.   Chest:      Chest wall: No tenderness.   Abdominal:      General: Abdomen is flat. Bowel sounds are normal. There is no distension.      Palpations: Abdomen is soft. There is no mass.      Tenderness: There is no abdominal tenderness. There is no right CVA tenderness, left CVA tenderness, guarding or rebound.      Hernia: No hernia is present.   Musculoskeletal:         General: Normal range of motion.      Cervical back: Normal range of motion and neck supple.   Skin:     General:  Skin is warm and dry.      Coloration: Skin is not pale.      Findings: No erythema.   Neurological:      General: No focal deficit present.      Mental Status: He is alert and oriented to person, place, and time.      GCS: GCS eye subscore is 4. GCS verbal subscore is 5. GCS motor subscore is 6.      Cranial Nerves: Cranial nerves are intact. No cranial nerve deficit, dysarthria or facial asymmetry.      Sensory: Sensation is intact. No sensory deficit.      Motor: Tremor present. No weakness.      Comments: Tremors to the upper and lower extremities with arms extended.  Tongue fasciculations noted.  No weakness to the upper and lower extremities.  Station intact to  the upper and lower extremities throughout.  Cranial 2 through 12 intact.  Does have some slurred speech and smells of alcohol.  No facial droop noted.   Psychiatric:         Behavior: Behavior normal.         DIAGNOSTIC RESULTS   LABS:    Labs Reviewed   CBC WITH AUTO DIFFERENTIAL - Abnormal; Notable for the following components:       Result Value    RDW 17.1 (*)     All other components within normal limits    Narrative:     Performed at:  [facility]  [address]  [place], [place] [address]   Phone [phone]   COMPREHENSIVE METABOLIC PANEL W/ REFLEX TO MG FOR LOW K - Abnormal; Notable for the following components:    Chloride 98 (*)     Anion Gap 17 (*)     Glucose 106 (*)     BUN 5 (*)     CREATININE 0.6 (*)     Alkaline Phosphatase 142 (*)     ALT 132 (*)     AST 213 (*)     All other components within normal limits    Narrative:     Performed at:  [name]  [address]  [place], [place] [address]   Phone [phone]   LIPASE - Abnormal; Notable for the following components:    Lipase 110.0 (*)     All other components within normal limits    Narrative:     Performed at:  [facility]  [address],  [place], [place] [address]   Phone [phone]    URINE DRUG SCREEN - Abnormal; Notable for the following components:    Cocaine Metabolite Screen, Urine POSITIVE (*)     All other components within normal limits    Narrative:     Performed at:  [facility]  [address]  [place], [place] [address]   Phone [phone]   ACETAMINOPHEN LEVEL - Abnormal; Notable for the following components:    Acetaminophen Level <5 (*)     All other components within normal limits    Narrative:     Performed at:  [facility]  [address]  [place], [place] [address]   Phone [phone]   SALICYLATE LEVEL - Abnormal; Notable for the following components:    Salicylate, Serum <0.3 (*)     All other components within normal limits    Narrative:     Performed at:  [facility]  [address],  [place], [place] [address]   Phone [phone]   URINE RT REFLEX TO CULTURE    Narrative:     Performed at:  [facility]  [address],  [place], [place] [address]   Phone [phone]   ETHANOL    Narrative:     Performed at:  [facility]  [address],  [place], [place] [address]   Phone [phone]   TROPONIN    Narrative:     Performed at:  [facility]  [address],  [place], [place] [address]   Phone [phone]       When ordered only abnormal lab results are displayed. All other labs were within normal range  or not returned as of this dictation.    EKG: When ordered, EKG's are interpreted by the Emergency Department Physician in the absence of a cardiologist.  Please see their note for interpretation of EKG.    RADIOLOGY:   Non-plain film images such as CT, Ultrasound and MRI are read by the radiologist. Plain radiographic images are visualized and preliminarily interpreted by the ED Provider with the below findings:        Interpretation per the Radiologist below, if available at the time of this  note:    XR CHEST PORTABLE   Final Result   No acute cardiopulmonary disease.           No results found.        PROCEDURES   Unless otherwise noted below, none     Procedures    CRITICAL CARE TIME   N/A    CONSULTS:  None      EMERGENCY DEPARTMENT COURSE and DIFFERENTIAL DIAGNOSIS/MDM:   Vitals:    Vitals:    06/05/20 1500 06/05/20 1504 06/05/20 1516 06/05/20 1600   BP: 120/79   (!) 138/91   Pulse: 88 92 97 76   Resp: 24 27 22  (!) 31   SpO2: 96% 97% 96% 94%   Weight:       Height:           Patient was given the following medications:  Medications   sodium chloride 0.9 % 1,000 mL with folic acid 1 mg, adult multi-vitamin with vitamin k 10 mL, thiamine 300 mg ( Intravenous Stopped 06/05/20 1516)   ondansetron (ZOFRAN) injection 4 mg (4 mg Intravenous Given 06/05/20 1223)   pantoprazole (PROTONIX) injection 40 mg (40 mg Intravenous Given 06/05/20 1226)   LORazepam (ATIVAN) injection 1 mg (1 mg Intravenous Given 06/05/20 1224)           Patient is a 34 year old male who presents to the ED with complaint of alcohol intoxication.  Patient apparently was trying to get on the bus but was intoxicated so bus driver refused to let him on the bus.  Please were called and please offered him either transport to the emergency department or to jail.  Patient elected to come to the emergency department per EMS.  Upon arrival patient states he was actually need to come to the emergency department because he is concerned is going through withdrawal.  Apparently had withdrawal in the past.  Patient upon examination is slightly tachycardic does have some tremors noted.  IV was established patient given banana bag, Protonix, Ativan and Zofran here in the ED.  Patient does appear to be slurring his words and smells of alcohol and believe he is actually intoxicated at this time.  Do not believe he is going through significant alcohol withdrawal.  Urinalysis was unremarkable.  Urine drug screen is positive for cocaine otherwise unremarkable.  CBC  showed normal white count, hemoglobin and platelets.  CMP showed ALT of 132 and AST of 213.  May potentially be due to patient's alcohol usage.  Otherwise unremarkable.  Lipase was 111 but patient has no epigastric abdominal tenderness on exam.  Suspicion low for pancreatitis.  Acetaminophen, salicylate unremarkable.  Ethanol 289.  Denies any suicidal homicidal ideation initially.  Chest x-ray and EKG obtained and unremarkable.  Troponin was normal.  Patient observed here in the ED for extended period time.  Patient fell asleep and then when he woke up he states he was ready to go.  Upon repeat evaluation  he is no longer slurring his words.  Able to ambulate with steady gait.  Believe patient be safely discharged home.  Patient states he already has outpatient resources for alcohol detox.  States he does not need any further resources.  We will give outpatient resources in his discharge paperwork the patient states he already has resources at home.  Patient states he is ready to go.  Wishes to go home.  Will be discharged with instructions to follow-up with resources he already has at home.    FINAL IMPRESSION      1. Acute alcoholic intoxication without complication [facility])          DISPOSITION/PLAN   DISPOSITION Decision To Discharge 06/05/2020 04:24:01 PM      PATIENT REFERRED TO:  [facility]  [address]  [place] [address]  [phone]  Go to   As needed, If symptoms worsen      DISCHARGE MEDICATIONS:  Discharge Medication List as of 06/05/2020  4:27 PM          DISCONTINUED MEDICATIONS:  Discharge Medication List as of 06/05/2020  4:27 PM                 (Please note that portions of this note were completed with a voice recognition program.  Efforts were made to edit the dictations but occasionally words are mis-transcribed.)    Verdon Cummins, PA (electronically signed)          Isabella Stalling, PA  06/05/20 1702

## 2020-06-05 NOTE — Discharge Instructions (Signed)
OUTPATIENT MENTAL HEALTH SERVICES  [place]  (Updated 01/2012)    [facility] [phone]-Mental Health Access Point: All initial referrals for community mental health services must go through [facility]. Also service [place]    [place] [facility] Board: [phone]  [email or web address].[email or web address]   updated wait times for services:  http://www.bcmhb.org/index.php?option=com_content&view=article&id=35&Itemid=36    [place] [facility]: [phone]  [place], [place] (group services)    [facility]:  [phone]  Parenting classes or questions [phone]  [place] and [place] Offices    [facility] Mental [facility] services: [place]: [phone]                                                     [place]: [phone]    [facility]: [phone]  [place], [place]. [facility]    [facility]: [phone]  [place], [place]. [facility]    [facility]:  [phone]  [place], [place] (phone services only)  24 Hour Crisis Hotlines:  [place]: [phone]                                            [place]:  [phone],                                             [place] [phone]    [facility]: [phone]  [place], [place]    [place] [facility]:  [phone]  [place], [place] (accept insurance, walk in available)    [facility] [place] [phone]                            [place] [phone]    [place] [facility]:  [phone]  [place], [place] (accept insurance, walk-in available)    [facility] (Group Therapy)    [phone]  [place], [place]  (Call Andrea on cell if needed [phone]                                 [facility]: [phone] (insured pt only- call for list they accept)  [place], [place]: Outpatient, Inpatient, adolescent, adult and geriatric    [facility]  [phone]  [place], [place]    [facility] [place] Residential [facility]:  [phone]  [place], [place]    Thank you for choosing [place]       OUTPATIENT MENTAL HEALTH  [place] (update 01/2012)    [facility] [phone]-Mental Health Access Point: All initial referrals for community mental health services must go through [facility]. Also service [place]    Crisis Intervention  Crisis Hotline:  513-281-2273(CARE)  Adult Mobile Crisis [phone] (through [facility])     [facility]   They have several locations throughout city.  Many services offered: counseling, psychiatry, case management, housing assist  [phone]    [facility]  [phone]    [facility]  [phone]) call central registration for appointment, 5 locations available      [facility]  [phone]    [facility]  [phone]-for general information and all services (M-F 8:30-5)  [phone]- adult outpatient intake      [facility]   [phone]    [facility]   They have many locations throughout the city. Call for office nearest you or office that can offer the first appointment.  [phone]    [facility]  [phone]    [facility]   They have numerous programs including residential, halfway house, substance abuse, adolescent and adult.  [phone]          Thank You for Choosing [place]

## 2020-06-05 NOTE — ED Notes (Signed)
Bed: 08  Expected date:   Expected time:   Means of arrival: [facility]  Comments:  [facility] [address]     Ellwood Handler, RN  06/05/20 1149

## 2020-06-05 NOTE — ED Notes (Signed)
Pt difficult to wake, speech is difficult to understand. Follows directions and moves all extremities, pt kneeled on bed to attempt to urinate, unable to go at this time, very unsteady. Returned to supine position, immediately fell asleep.      Lake Bells, RN  06/05/20 [phone]

## 2020-06-15 ENCOUNTER — Inpatient Hospital Stay: Admit: 2020-06-15 | Discharge: 2020-06-16 | Discharge status: Against Medical Advice | Payer: PRIVATE HEALTH INSURANCE

## 2020-06-15 DIAGNOSIS — R079 Chest pain, unspecified: Secondary | ICD-10-CM

## 2020-06-15 NOTE — ED Notes (Signed)
Per squad, pt wanted to get into the [facility] but was told he must be admitted in the hospital for x3 days prior to being admitted there. Pt states he needs to come off drugs and alcohol and has been having chest pain x1 day.

## 2020-06-15 NOTE — Unmapped (Signed)
Pt arrives via EMS for chest pain. Pt also would like to detox from alcohol. VSS.

## 2020-06-23 ENCOUNTER — Inpatient Hospital Stay: Admit: 2020-06-23 | Discharge: 2020-06-24 | Discharge status: Against Medical Advice | Payer: PRIVATE HEALTH INSURANCE
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
## 2020-06-25 NOTE — Unmapped (Signed)
Tried to contact the patient to complete Social Determinates of Health Screening as part of [facility]'s participation in the [facility] Program.  No answer.

## 2020-08-05 ENCOUNTER — Emergency Department: Admit: 2020-08-06 | Payer: PRIVATE HEALTH INSURANCE

## 2020-08-05 ENCOUNTER — Inpatient Hospital Stay
Admission: EM | Admit: 2020-08-05 | Discharge: 2020-08-07 | Discharge status: Against Medical Advice | Payer: PRIVATE HEALTH INSURANCE

## 2020-08-05 DIAGNOSIS — F10239 Alcohol dependence with withdrawal, unspecified: Secondary | ICD-10-CM

## 2020-08-05 MED ORDER — diazePAM (VALIUM) Syrg 5 mg
5 | Freq: Once | INTRAMUSCULAR | Status: AC
Start: 2020-08-05 — End: 2020-08-05
  Administered 2020-08-06: 03:00:00 5 mg via INTRAVENOUS

## 2020-08-05 MED ORDER — diazePAM (VALIUM) Syrg 10 mg
5 | Freq: Once | INTRAMUSCULAR | Status: AC
Start: 2020-08-05 — End: 2020-08-05

## 2020-08-05 MED ORDER — cloNIDine HCL (CATAPRES) tablet 0.1 mg
0.1 | Freq: Three times a day (TID) | ORAL | Status: AC
Start: 2020-08-05 — End: 2020-08-05

## 2020-08-05 MED ORDER — diazePAM (VALIUM) tablet 10 mg
5 | Freq: Once | ORAL | Status: AC
Start: 2020-08-05 — End: 2020-08-05

## 2020-08-05 MED ORDER — midazolam (PF) (VERSED) injection 5 mg
5 | Freq: Once | INTRAMUSCULAR | Status: AC
Start: 2020-08-05 — End: 2020-08-05

## 2020-08-05 MED ORDER — diazePAM (VALIUM) Syrg 5 mg
5 | Freq: Three times a day (TID) | INTRAMUSCULAR | Status: AC
Start: 2020-08-05 — End: 2020-08-07

## 2020-08-05 MED ORDER — LORazepam (ATIVAN) tablet 4 mg
2 | ORAL | Status: AC | PRN
Start: 2020-08-05 — End: 2020-08-05

## 2020-08-05 MED ORDER — abacavir-dolutegravir-lamivud (TRIUMEQ) 600-50-300 mg tablet 1 tablet
600-50-300 | Freq: Every day | ORAL | Status: AC
Start: 2020-08-05 — End: 2020-08-07
  Administered 2020-08-06 – 2020-08-07 (×2): 1 via ORAL

## 2020-08-05 MED ORDER — ondansetron (ZOFRAN) injection 4 mg
4 | Freq: Four times a day (QID) | INTRAMUSCULAR | Status: AC | PRN
Start: 2020-08-05 — End: 2020-08-06

## 2020-08-05 MED ORDER — midazolam (PF) (VERSED) 5 mg/mL injection
5 | INTRAMUSCULAR | Status: AC
Start: 2020-08-05 — End: 2020-08-05
  Administered 2020-08-06: 5 via INTRAVENOUS

## 2020-08-05 MED ORDER — heparin (porcine) injection 5,000 Units
5000 | Freq: Three times a day (TID) | INTRAMUSCULAR | Status: AC
Start: 2020-08-05 — End: 2020-08-07
  Administered 2020-08-06 – 2020-08-07 (×2): 5000 [IU] via SUBCUTANEOUS

## 2020-08-05 MED ORDER — loperamide (IMODIUM) capsule 2 mg
2 | ORAL | Status: AC | PRN
Start: 2020-08-05 — End: 2020-08-07
  Administered 2020-08-07: 13:00:00 2 mg via ORAL

## 2020-08-05 MED ORDER — multivitamin with folic acid tablet 1 tablet
400 | Freq: Every day | ORAL | Status: AC
Start: 2020-08-05 — End: 2020-08-07
  Administered 2020-08-06 – 2020-08-07 (×2): 1 via ORAL

## 2020-08-05 MED ORDER — diazePAM (VALIUM) tablet 5 mg
5 | Freq: Three times a day (TID) | ORAL | Status: AC
Start: 2020-08-05 — End: 2020-08-07
  Administered 2020-08-06 – 2020-08-07 (×4): 5 mg via ORAL

## 2020-08-05 MED ORDER — metoprolol tartrate (LOPRESSOR) tablet 25 mg
25 | Freq: Two times a day (BID) | ORAL | Status: AC
Start: 2020-08-05 — End: 2020-08-05

## 2020-08-05 MED ORDER — ibuprofen (MOTRIN) tablet 400 mg
400 | ORAL | Status: AC | PRN
Start: 2020-08-05 — End: 2020-08-07
  Administered 2020-08-06: 06:00:00 400 mg via ORAL

## 2020-08-05 MED ORDER — multivitamin with folic acid tablet 1 tablet
400 | Freq: Once | ORAL | Status: AC
Start: 2020-08-05 — End: 2020-08-05

## 2020-08-05 MED ORDER — rallypackinsodiumchloride09IVinfusion
100 | INTRAMUSCULAR | Status: AC
Start: 2020-08-05 — End: 2020-08-05

## 2020-08-05 MED ORDER — folic acid (FOLVITE) tablet 1 mg
1 | Freq: Every day | ORAL | Status: AC
Start: 2020-08-05 — End: 2020-08-07
  Administered 2020-08-06 – 2020-08-07 (×2): 1 mg via ORAL

## 2020-08-05 MED ORDER — naloxone (NARCAN) injection 0.4 mg
0.4 | INTRAMUSCULAR | Status: AC | PRN
Start: 2020-08-05 — End: 2020-08-05

## 2020-08-05 MED ORDER — buprenorphine HCL (SUBUTEX) SL tablet 4 mg
2 | Freq: Once | SUBLINGUAL | Status: AC | PRN
Start: 2020-08-05 — End: 2020-08-05

## 2020-08-05 MED ORDER — diazePAM (VALIUM) tablet 10 mg
5 | Freq: Three times a day (TID) | ORAL | Status: AC
Start: 2020-08-05 — End: 2020-08-05

## 2020-08-05 MED ORDER — folic acid (FOLVITE) tablet 1 mg
1 | Freq: Once | ORAL | Status: AC
Start: 2020-08-05 — End: 2020-08-05

## 2020-08-05 MED ORDER — diazePAM (VALIUM) Syrg 10 mg
5 | Freq: Three times a day (TID) | INTRAMUSCULAR | Status: AC
Start: 2020-08-05 — End: 2020-08-05

## 2020-08-05 MED ORDER — lactated Ringers 1,000 mL IV fluid
Freq: Once | INTRAVENOUS | Status: AC
Start: 2020-08-05 — End: 2020-08-05
  Administered 2020-08-06: via INTRAVENOUS

## 2020-08-05 MED ORDER — LORazepam (ATIVAN) tablet 2 mg
1 | ORAL | Status: AC | PRN
Start: 2020-08-05 — End: 2020-08-06

## 2020-08-05 MED ORDER — diazePAM (VALIUM) tablet 5 mg
5 | Freq: Once | ORAL | Status: AC
Start: 2020-08-05 — End: 2020-08-06
  Administered 2020-08-06: 04:00:00 5 mg via ORAL

## 2020-08-05 MED ORDER — LORazepam (ATIVAN) tablet 2 mg
1 | ORAL | Status: AC | PRN
Start: 2020-08-05 — End: 2020-08-05

## 2020-08-05 MED ORDER — ondansetron (ZOFRAN) tablet 4 mg
4 | Freq: Four times a day (QID) | ORAL | Status: AC | PRN
Start: 2020-08-05 — End: 2020-08-05

## 2020-08-05 MED ORDER — LORazepam (ATIVAN) tablet 1 mg
1 | ORAL | Status: AC | PRN
Start: 2020-08-05 — End: 2020-08-06
  Administered 2020-08-06 (×2): 1 mg via ORAL

## 2020-08-05 MED ORDER — thiamine mononitrate (VITAMIN B-1) tablet 100 mg
100 | Freq: Every day | ORAL | Status: AC
Start: 2020-08-05 — End: 2020-08-07
  Administered 2020-08-06 – 2020-08-07 (×2): 100 mg via ORAL

## 2020-08-05 MED ORDER — cloNIDine HCL (CATAPRES) tablet 0.1 mg
0.1 | Freq: Two times a day (BID) | ORAL | Status: AC | PRN
Start: 2020-08-05 — End: 2020-08-07
  Administered 2020-08-06: 06:00:00 0.1 mg via ORAL

## 2020-08-05 MED ORDER — triamcinolone (KENALOG) 0.1 % cream
0.1 | Freq: Two times a day (BID) | TOPICAL | Status: AC
Start: 2020-08-05 — End: 2020-08-07
  Administered 2020-08-06 – 2020-08-07 (×3): via TOPICAL

## 2020-08-05 MED ORDER — thiamine mononitrate (VITAMIN B-1) tablet 100 mg
100 | Freq: Every day | ORAL | Status: AC
Start: 2020-08-05 — End: 2020-08-05

## 2020-08-05 MED ORDER — ondansetron (ZOFRAN) injection 8 mg
4 | Freq: Once | INTRAMUSCULAR | Status: AC
Start: 2020-08-05 — End: 2020-08-05
  Administered 2020-08-06: 03:00:00 8 mg via INTRAVENOUS

## 2020-08-05 MED FILL — DIAZEPAM 5 MG TABLET: 5 5 MG | ORAL | Qty: 1

## 2020-08-05 MED FILL — LORAZEPAM 2 MG TABLET: 2 2 MG | ORAL | Qty: 2

## 2020-08-05 MED FILL — ONDANSETRON HCL (PF) 4 MG/2 ML INJECTION SOLUTION: 4 4 mg/2 mL | INTRAMUSCULAR | Qty: 4

## 2020-08-05 MED FILL — MIDAZOLAM (PF) 5 MG/ML INJECTION SOLUTION: 5 5 mg/mL | INTRAMUSCULAR | Qty: 1

## 2020-08-05 MED FILL — DIAZEPAM 5 MG/ML INJECTION SYRINGE: 5 5 mg/mL | INTRAMUSCULAR | Qty: 2

## 2020-08-05 NOTE — Unmapped (Signed)
Admitting team at bedside assessing patient.
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
## 2020-08-05 NOTE — Unmapped (Addendum)
Pt is warm, diaphoretic, tachycardic, and hypertensive stated he's going through ETOH withdrawal. Last drink this AM. Pt is A&O x4.

## 2020-08-05 NOTE — ED Provider Notes (Signed)
ED Attending Attestation Note    Date of service:  08/05/2020    This patient was seen by the resident physician.  I have seen and examined the patient, agree with the workup, evaluation, management and diagnosis. The care plan has been discussed and I concur.  I have reviewed the ECG and concur with the resident's interpretation.    My assessment reveals a 34 y.o. male p/w alcohol withdrawal, h/o DTs/seizure w/ previous withdrawal, his last last drink yesterday and states he wants to quit drinking and this is why he stopped. Exam reveals anxious middle-aged male, sitting up in bed, tremors x4 extremities, but no seizure activity, AAOx3, CN II-XII intacted as tested, full motor and sensation grossly intact x4 extremities, sinus tachycardia, CTAB, 2+ peripheral pulses, skin warm and diaphoretic.

## 2020-08-05 NOTE — Unmapped (Signed)
[facility] ED Note    Date of Service: 08/05/2020  Reason for Visit: Medication Refill and Alcohol Problem      Patient History     HPI  Alexander [name] is a 34 y.o. male history of alcohol use, opiate use, HIV, hep C presenting with alcohol withdrawal.  Patient states he attempted to decrease his alcohol intake last drink today approximately 2 beers however he states he typically drinks at least a sixpack of 8% beer.  Patient noticed some tremulousness as well as nausea.  Patient denies any recent illness including fevers chills cough chest pain shortness of breath or abdominal pain.  Patient states that he last used fentanyl yesterday.  Patient states he does have a history of DTs.     Other than stated above, no additional aggravating or alleviating factors are noted.    Past Medical History:   Diagnosis Date   ??? Anxiety    ??? Bipolar disorder (CMS Dx)    ??? Bronchitis    ??? Depression    ??? Drug abuse and dependence (CMS Dx)    ??? H/O tooth extraction    ??? Hepatitis C    ??? HIV infection (CMS Dx)    ??? Liver disease Hep C   ??? PTSD (post-traumatic stress disorder)    ??? Substance induced mood disorder (CMS Dx)    ??? Thyroid disease      Past Surgical History:   Procedure Laterality Date   ??? MANDIBLE SURGERY       Patient  reports that he has been smoking cigarettes. He has a 11.00 pack-year smoking history. He has never used smokeless tobacco. He reports current alcohol use. He reports current drug use. Drugs: Heroin, Other-see comments, Marijuana, and Cocaine.  Current Discharge Medication List      CONTINUE these medications which have NOT CHANGED    Details   folic acid (FOLVITE) 1 MG tablet       metoprolol tartrate (LOPRESSOR) 25 MG tablet Take by mouth.      TRIUMEQ 600-50-300 mg Tab TAKE ONE TABLET BY MOUTH DAILY  Qty: 30 tablet, Refills: 1    Associated Diagnoses: Symptomatic HIV infection (CMS Dx)             Allergies:   Allergies as of 08/05/2020   ??? (No Known  Allergies)       All nursing notes and triage notes were appropriately reviewed in the course of the creation of this note.     Review of Systems     All other systems are negative except as mentioned in HPI.  Physical Exam     Vitals:    08/05/20 2335 08/05/20 2343 08/06/20 0029 08/06/20 0133   BP: (!) 154/95  (!) 161/98 155/89   BP Location: Right arm   Right arm   Patient Position:    Lying   Pulse: 122 93 85 102   Resp: 16 14 21 22    Temp:   99.5 ??F (37.5 ??C) 99 ??F (37.2 ??C)   TempSrc:   Oral Oral   SpO2: 91% 94% 94% 96%       Physical Exam  Vitals reviewed.   Constitutional:       Appearance: Normal appearance.      Comments: Tremulous   HENT:      Head: Normocephalic and atraumatic.      Nose: Nose normal. No congestion.      Mouth/Throat:      Mouth: Mucous  membranes are moist.      Pharynx: Oropharynx is clear.   Eyes:      Extraocular Movements: Extraocular movements intact.      Conjunctiva/sclera: Conjunctivae normal.   Cardiovascular:      Rate and Rhythm: Regular rhythm. Tachycardia present.      Pulses: Normal pulses.      Heart sounds: Normal heart sounds. No murmur heard.   No friction rub. No gallop.    Pulmonary:      Effort: Pulmonary effort is normal.      Breath sounds: Normal breath sounds. No wheezing, rhonchi or rales.   Abdominal:      General: There is no distension.      Palpations: Abdomen is soft.      Tenderness: There is no abdominal tenderness. There is no guarding or rebound.   Musculoskeletal:         General: No swelling. Normal range of motion.      Cervical back: Normal range of motion and neck supple.   Skin:     General: Skin is warm and dry.   Neurological:      General: No focal deficit present.      Mental Status: He is alert and oriented to person, place, and time.   Psychiatric:         Mood and Affect: Mood normal.         Behavior: Behavior normal.         Diagnostic Studies     Labs:  Please see electronic medical record for any tests performed in the  ED    Radiology:  X-ray Portable Chest   Final Result   IMPRESSION:    No acute cardiopulmonary abnormality.      Approved by Alexander Provencal, MD on 08/05/2020 8:47 PM EDT      I have personally reviewed the images and I agree with this report.      Report Verified by: Alexander Ped, MD at 08/05/2020 8:53 PM EDT          EKG:  Sinus tachycardia with a rate of 125.  Normal intervals, no ST segment change.  Emergency Department Procedures       ED Course and MDM     Alexander [name] is a 34 y.o. male with a history and presentation as described above in HPI.  The patient was evaluated by myself and the ED Attending Physician, Dr. Tomasa Blase. All management and disposition plans were discussed and agreed upon.    Patient's presentation concerning for alcohol withdrawal given tremulousness, tachycardia and moderate hypertension.  Patient was given IV Versed initially.  EKG showed sinus tachycardia.  On reassessment patient requesting nausea medication and was given additional 5 mg IV of Valium.  Patient was also given IV rally pack as he was not able to tolerate p.o. intake at this time.  Patient did have a notable elevated ethanol level of 169.  Patient's laboratory studies largely reassuring.  Patient's electrolytes are within normal limits.  Patient did have elevated AST ALT consistent with alcohol use.  Chest x-ray showed no acute abnormality.  Patient had no evidence of acute infection on my exam and no history to suggest acute infection.  Patient's last CD4 count was 506.    At this time the patient has been admitted to medical stepdown for further evaluation and management of alcohol withdrawal. The patient will continue to be monitored here in the emergency department until which time he is moved  to his new treatment location.    Impression     1. Alcohol withdrawal syndrome without complication (CMS Dx)         Plan     1. The patient is to be admitted in stable/improved condition  2. Workup, treatment and diagnosis were  discussed with the patient and/or family members; the patient agrees to the plan and all questions were addressed and answered.    Celene Pippins ANN Quentin Angst, MD, PGY-3  [facility]    Critical Care Time (Attendings)              Lorenza Chick, MD  08/06/20 [phone]

## 2020-08-05 NOTE — ED Triage Notes (Signed)
Patient to [facility] with alcohol problem.  Patient states that he is in withdrawal.  Patient states that his last drink was this morning.

## 2020-08-06 ENCOUNTER — Inpatient Hospital Stay: Admit: 2020-08-06 | Payer: PRIVATE HEALTH INSURANCE

## 2020-08-06 LAB — RENAL FUNCTION PANEL W/EGFR
Albumin: 3.8 g/dL (ref 3.5–5.7)
Anion Gap: 14 mmol/L (ref 3–16)
BUN: 5 mg/dL — ABNORMAL LOW (ref 7–25)
CO2: 29 mmol/L (ref 21–33)
Calcium: 9.9 mg/dL (ref 8.6–10.3)
Chloride: 93 mmol/L — ABNORMAL LOW (ref 98–110)
Creatinine: 0.6 mg/dL (ref 0.60–1.30)
Glucose: 86 mg/dL (ref 70–100)
Osmolality, Calculated: 279 mosm/kg (ref 278–305)
Phosphorus: 4.4 mg/dL (ref 2.1–4.7)
Potassium: 3.9 mmol/L (ref 3.5–5.3)
Sodium: 136 mmol/L (ref 133–146)
eGFR AA CKD-EPI: >90 See note.
eGFR NONAA CKD-EPI: >90 See note.

## 2020-08-06 LAB — DIFFERENTIAL
Basophils Absolute: 33 /uL (ref 0–200)
Basophils Absolute: 68 /uL (ref 0–200)
Basophils Relative: 0.6 % (ref 0.0–1.0)
Basophils Relative: 0.9 % (ref 0.0–1.0)
Eosinophils Absolute: 15 /uL (ref 15–500)
Eosinophils Absolute: 22 /uL (ref 15–500)
Eosinophils Relative: 0.2 % (ref 0.0–8.0)
Eosinophils Relative: 0.4 % (ref 0.0–8.0)
Lymphocytes Absolute: 1166 /uL (ref 850–3900)
Lymphocytes Absolute: 1915 /uL (ref 850–3900)
Lymphocytes Relative: 21.2 % (ref 15.0–45.0)
Lymphocytes Relative: 25.2 % (ref 15.0–45.0)
Monocytes Absolute: 1018 /uL (ref 200–950)
Monocytes Absolute: 798 /uL (ref 200–950)
Monocytes Relative: 13.4 % (ref 0.0–12.0)
Monocytes Relative: 14.5 % (ref 0.0–12.0)
Neutrophils Absolute: 3482 /uL (ref 1500–7800)
Neutrophils Absolute: 4583 /uL (ref 1500–7800)
Neutrophils Relative: 60.3 % (ref 40.0–80.0)
Neutrophils Relative: 63.3 % (ref 40.0–80.0)
nRBC: 0 /100 WBC (ref 0–0)
nRBC: 0 /100 WBC (ref 0–0)

## 2020-08-06 LAB — CBC
Hematocrit: 44.9 % (ref 38.5–50.0)
Hematocrit: 46.8 % (ref 38.5–50.0)
Hemoglobin: 15.2 g/dL (ref 13.2–17.1)
Hemoglobin: 15.9 g/dL (ref 13.2–17.1)
MCH: 29.5 pg (ref 27.0–33.0)
MCH: 29.8 pg (ref 27.0–33.0)
MCHC: 33.8 g/dL (ref 32.0–36.0)
MCHC: 34 g/dL (ref 32.0–36.0)
MCV: 87.3 fL (ref 80.0–100.0)
MCV: 87.6 fL (ref 80.0–100.0)
MPV: 9.5 fL (ref 7.5–11.5)
MPV: 8.6 fL (ref 7.5–11.5)
Platelets: 179 10E3/uL (ref 140–400)
Platelets: 134 10E3/uL — ABNORMAL LOW (ref 140–400)
RBC: 5.14 10E6/uL (ref 4.20–5.80)
RBC: 5.34 10E6/uL (ref 4.20–5.80)
RDW: 15.1 % — ABNORMAL HIGH (ref 11.0–15.0)
RDW: 15.3 % — ABNORMAL HIGH (ref 11.0–15.0)
WBC: 7.6 10E3/uL (ref 3.8–10.8)
WBC: 5.5 10E3/uL (ref 3.8–10.8)

## 2020-08-06 LAB — BASIC METABOLIC PANEL
Anion Gap: 16 mmol/L (ref 3–16)
BUN: 3 mg/dL (ref 7–25)
CO2: 27 mmol/L (ref 21–33)
Calcium: 9.4 mg/dL (ref 8.6–10.3)
Chloride: 95 mmol/L (ref 98–110)
Creatinine: 0.73 mg/dL (ref 0.60–1.30)
Glucose: 124 mg/dL (ref 70–100)
Osmolality, Calculated: 284 mOsm/kg (ref 278–305)
Potassium: 3.5 mmol/L (ref 3.5–5.3)
Sodium: 138 mmol/L (ref 133–146)
eGFR AA CKD-EPI: >90 See note.
eGFR NONAA CKD-EPI: >90 See note.

## 2020-08-06 LAB — HEPATIC FUNCTION PANEL
ALT: 74 U/L — ABNORMAL HIGH (ref 7–52)
ALT: 61 U/L — ABNORMAL HIGH (ref 7–52)
AST: 139 U/L — ABNORMAL HIGH (ref 13–39)
AST: 126 U/L — ABNORMAL HIGH (ref 13–39)
Albumin: 4.4 g/dL (ref 3.5–5.7)
Albumin: 3.8 g/dL (ref 3.5–5.7)
Alkaline Phosphatase: 165 U/L — ABNORMAL HIGH (ref 36–125)
Alkaline Phosphatase: 134 U/L — ABNORMAL HIGH (ref 36–125)
Bilirubin, Direct: 0.2 mg/dL (ref 0.0–0.4)
Bilirubin, Direct: 0.44 mg/dL — ABNORMAL HIGH (ref 0.00–0.40)
Bilirubin, Indirect: 0.4 mg/dL (ref 0.0–1.1)
Bilirubin, Indirect: 0.46 mg/dL (ref 0.00–1.10)
Total Bilirubin: 0.6 mg/dL (ref 0.0–1.5)
Total Bilirubin: 0.9 mg/dL (ref 0.0–1.5)
Total Protein: 9.1 g/dL — ABNORMAL HIGH (ref 6.4–8.9)
Total Protein: 8.1 g/dL (ref 6.4–8.9)

## 2020-08-06 LAB — 2019 NOVEL CORONAVIRUS (COVID-19), NAA-B: SARS-CoV-2: NOT DETECTED

## 2020-08-06 LAB — PROTIME-INR
INR: 1 (ref 0.9–1.1)
INR: 1 (ref 0.9–1.1)
Protime: 13.5 seconds (ref 12.1–15.1)
Protime: 13.5 seconds (ref 12.1–15.1)

## 2020-08-06 LAB — LIPASE
Lipase: 110 U/L (ref 4–82)
Lipase: 24 U/L (ref 4–82)

## 2020-08-06 LAB — ETHANOL, SERUM: Ethanol: 169 mg/dL (ref 0–10)

## 2020-08-06 MED ORDER — electrolyte-R (pH 7.4) (NORMOSOL-R pH 7.4) 500 mL bolus
Freq: Once | INTRAVENOUS | Status: AC
Start: 2020-08-06 — End: 2020-08-06
  Administered 2020-08-07: 01:00:00 via INTRAVENOUS

## 2020-08-06 MED ORDER — metoprolol tartrate (LOPRESSOR) tablet 25 mg
25 | Freq: Two times a day (BID) | ORAL | Status: AC
Start: 2020-08-06 — End: 2020-08-07
  Administered 2020-08-06 – 2020-08-07 (×3): 25 mg via ORAL

## 2020-08-06 MED ORDER — cyclobenzaprine (FLEXERIL) tablet 5 mg
5 | Freq: Three times a day (TID) | ORAL | Status: AC | PRN
Start: 2020-08-06 — End: 2020-08-07
  Administered 2020-08-06 (×2): 5 mg via ORAL

## 2020-08-06 MED ORDER — LORazepam (ATIVAN) tablet 1 mg
1 | Freq: Once | ORAL | Status: AC
Start: 2020-08-06 — End: 2020-08-06
  Administered 2020-08-06: 15:00:00 1 mg via ORAL

## 2020-08-06 MED ORDER — nicotine (polacrilex) (NICORETTE) gum 2 mg
2 | BUCCAL | Status: AC | PRN
Start: 2020-08-06 — End: 2020-08-07

## 2020-08-06 MED ORDER — nicotine (NICODERM CQ) 14 mg/24 hr 1 patch
14 | Freq: Every day | TRANSDERMAL | Status: AC
Start: 2020-08-06 — End: 2020-08-07
  Administered 2020-08-06 – 2020-08-07 (×2): 1 via TRANSDERMAL

## 2020-08-06 MED ORDER — proMETHazine (PHENERGAN) tablet 12.5 mg
25 | Freq: Four times a day (QID) | ORAL | Status: AC | PRN
Start: 2020-08-06 — End: 2020-08-07
  Administered 2020-08-06: 23:00:00 12.5 mg via ORAL

## 2020-08-06 MED ORDER — LORazepam (ATIVAN) tablet 4 mg
2 | ORAL | Status: AC | PRN
Start: 2020-08-06 — End: 2020-08-07

## 2020-08-06 MED ORDER — LORazepam (ATIVAN) tablet 2 mg
1 | ORAL | Status: AC | PRN
Start: 2020-08-06 — End: 2020-08-07
  Administered 2020-08-06 – 2020-08-07 (×3): 2 mg via ORAL

## 2020-08-06 MED FILL — LORAZEPAM 1 MG TABLET: 1 1 MG | ORAL | Qty: 2

## 2020-08-06 MED FILL — NICOTINE 14 MG/24 HR DAILY TRANSDERMAL PATCH: 14 14 mg/24 hr | TRANSDERMAL | Qty: 1

## 2020-08-06 MED FILL — TRIUMEQ 600 MG-50 MG-300 MG TABLET: 600-50-300 600-50-300 mg | ORAL | Qty: 1

## 2020-08-06 MED FILL — HEPARIN (PORCINE) 5,000 UNIT/ML INJECTION SOLUTION: 5000 5,000 unit/mL | INTRAMUSCULAR | Qty: 1

## 2020-08-06 MED FILL — CYCLOBENZAPRINE 5 MG TABLET: 5 5 MG | ORAL | Qty: 1

## 2020-08-06 MED FILL — PROMETHAZINE 25 MG TABLET: 25 25 MG | ORAL | Qty: 1

## 2020-08-06 MED FILL — TRIAMCINOLONE ACETONIDE 0.1 % TOPICAL CREAM: 0.1 0.1 % | TOPICAL | Qty: 15

## 2020-08-06 MED FILL — FOLIC ACID 1 MG TABLET: 1 1 MG | ORAL | Qty: 1

## 2020-08-06 MED FILL — LORAZEPAM 1 MG TABLET: 1 1 MG | ORAL | Qty: 1

## 2020-08-06 MED FILL — CYCLOBENZAPRINE 10 MG TABLET: 10 10 MG | ORAL | Qty: 1

## 2020-08-06 MED FILL — NICOTINE (POLACRILEX) 2 MG GUM: 2 2 mg | BUCCAL | Qty: 1

## 2020-08-06 MED FILL — DIAZEPAM 5 MG TABLET: 5 5 MG | ORAL | Qty: 1

## 2020-08-06 MED FILL — VITAMIN B-1 (MONONITRATE) 100 MG TABLET: 100 100 mg | ORAL | Qty: 1

## 2020-08-06 MED FILL — METOPROLOL TARTRATE 25 MG TABLET: 25 25 MG | ORAL | Qty: 1

## 2020-08-06 MED FILL — FOLIC ACID 5 MG/ML INJECTION SOLUTION: 5 5 mg/mL | INTRAMUSCULAR | Qty: 10

## 2020-08-06 MED FILL — IBUPROFEN 400 MG TABLET: 400 400 MG | ORAL | Qty: 1

## 2020-08-06 MED FILL — LORAZEPAM 2 MG TABLET: 2 2 MG | ORAL | Qty: 2

## 2020-08-06 MED FILL — CLONIDINE HCL 0.1 MG TABLET: 0.1 0.1 MG | ORAL | Qty: 1

## 2020-08-06 MED FILL — THERA 400 MCG TABLET: 400 400 mcg | ORAL | Qty: 1

## 2020-08-06 NOTE — Unmapped (Signed)
Problem: Pain  Goal: Patient's pain is progressing toward patient's stated pain goal  Description: Assess and monitor patient's pain using appropriate pain scale. Collaborate with interdisciplinary team and initiate plan and interventions as ordered. Re-assess patient's pain level 30 - 60 minutes after pain management intervention.   Outcome: Progressing
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
## 2020-08-06 NOTE — Unmapped (Signed)
Best Possible Medication History  [facility]  Department of Pharmacy Services    Patient Name: Alexander [name] 1986-06-15  Information obtained from: Patient  Pharmacy: [facility]    Medications prescribed, but were not listed on the prior to admission (PTA) med list:   N/A    Medications not currently prescribed, but were on the PTA med list (include reason):   Folic acid    Discrepancies discovered in dose, route, or frequency on the PTA med list:   Metoprolol- updated the strength, dose, route and frequency    Medications prescribed but not taking (include reason):   N/A    Other notes regarding med history: I called [facility] and spoke to the pharmacist to get the directions and strength for metoprolol and both were confirmed by the pharmacist. Patient reports only currently taking metoprolol and Triumeq.    Merry Proud, Cpht  Medication Reconciliation Technician  08/06/20   9:57 AM    To my knowledge the below is the best possible medication history as of 08/06/2020 9:56 AM.  Please contact via Epic Chat with questions and/or concerns.     Saundra Gin      Home Medications:   Prior to Admission medications taking for visit date 08/05/20   Medication Sig Taking? Authorizing Provider   metoprolol tartrate (LOPRESSOR) 25 MG tablet Take 25 mg by mouth 2 times a day.        Yes Historical Provider, MD   TRIUMEQ 600-50-300 mg Tab TAKE ONE TABLET BY MOUTH DAILY Yes Curly Rim, MD

## 2020-08-06 NOTE — Unmapped (Signed)
Alexander [name]  Alcohol Audit    Alexander [name]  [id number]  [birth year]      Alcohol Level  Alcohol Level obtained?: Yes  Alcohol Level (mg/dL): 540 mg/dL  Alcohol Level obtained where?: [facility]  Appropriate for Audit?: Yes  Comments:N/A    Audit Questionnaire  How often do you have a drink containing alcohol?: 4 or more times a week  How many drinks do you have?: 10 or more  How often do you have 5 or more drinks?: Daily or almost daily  How often have you been unable to stop drinking?: Daily or almost daily  How often have you failed to do what was expected?: Daily or almost daily  How often have you needed a first drink in the morning?: Daily or almost daily  How often do you have guilt or remorse?: Daily or almost daily  How often have you been unable to remember what happened?: Never  Have you or someone else been injured due to your drinking?: No  Have others been concerned about your drinking?: Yes, during the last year  Total Score: 32    Intervention: Score greater than or equal to 8 (greater than or equal to 4 for women and ages 16-21 & 49 or older) indicates a strong likelihood of hazardous or harmful alcohol consumption  Intervention: AUDIT & Brief Intervention 15-30 minutes  Comments: SW provided patient with alcohol brochure and list of inpatient and outpatient treatment options. Patient requested SW follow-up tomorrow, 9/3, to discuss treatment options. SUD consult ordered by medicine team.    Deborra Medina, MSW, LSW  08/06/2020  9:35 AM   Inpatient Social Worker/Care Coordinator  Internal Medicine  [phone]

## 2020-08-06 NOTE — Unmapped (Signed)
Ready and clean bed assigned to 7352. Pt updated on plan of care including transfer and is agreeable. Receiving RN may call [phone] to consult ED RN with questions regarding patients care. Pt is leaving the department in stable condition with all personal items in possession.   Ordered medications that have been received from Pharmacy will be tubed to receiving unit.  The patient does not have a patient monitor at bedside in the ED.  Pt was swabbed for COVID 19 and is considered Low risk.   Most recent vitals: BP (!) 161/98    Pulse 85    Temp 99.5 ??F (37.5 ??C) (Oral)    Resp 21    SpO2 94%     Elzie Rings ,RN

## 2020-08-06 NOTE — Unmapped (Signed)
Patient has been transferred to hospital bed for comfort. Seizure precautions remain in place. Will continue to monitor.

## 2020-08-06 NOTE — Unmapped (Signed)
Patient is requesting medication for cramps. Admitting team has been messaged.

## 2020-08-06 NOTE — Unmapped (Signed)
Internal Medicine - Updated Plan    Patient was discussed with the night resident and seen on rounds with the attending physician. Please see H&P for further details.    Assessment & Plan     Alexander [name] is a 34 y.o. with a history of EtOH and fentanyl abuse, HIV, HCV, HTN, who is admitted for alcohol withdrawal.     #Alcohol withdrawal  #Opioid withdrawal  - CIWA w/ prn ativan 2-4mg   - COWS  - Valium 5 mg TID  - prn loperamide  - prn phenergan  - prn clonidine  - Addiction medicine consult    #RUQ pain  - RUQ ultrasound  - trend LFTs, elevated    #HTN  - restart metoprolol    #HIV  - continue abacavir-dolutegravir-lamivud    #HCV  - treatment naive, requires 6 months sobriety before starting antiviral       Orders Placed This Encounter   Procedures   ??? Diet regular     DVT prophylaxis: Subcutaneous heparin (prophylaxis)  Code Status: Full Code      Trena Platt, MS4  08/06/2020, 11:48 AM

## 2020-08-06 NOTE — Unmapped (Signed)
ADDICTION CONSULT, INITIAL EVALUATION    ASSESSMENT AND RECOMMENDATIONS:    Alcohol use disorder, severe; in withdrawal - -Agree with withdrawal management via benzodiazepines with base dose + CIWA triggered dosing.   -Agree with thiamine.   -Pt reporting interest in [facility] for residential treatment of SUD. Recommend having SW reach out to [facility] regarding bed status and what they may need from hospitalization (chart data/notes) for consideration of admission.  --Pt will need to contact [facility] for phone screening.   --Likely pt will have to be stabilized in regards to alcohol withdrawal and be be tapered slightly on benzodiazepines prior to being able to be accepted.     Opioid use disorder, severe - Pt reporting preference for methadone treatment. Does not have ID due to losing it 5 days ago. ID in system appears to have expired with his birthday this year. Would need ID for admission to methadone clinic. Additionally, if pt intends to go to [facility], would not be able to continue methadone there. Will review options with patient tomorrow re: if he would like to proceed with buprenorphine while here and transition to methadone as an outpatient.     Will continue to follow.     Chrys Racer , MD      [name] [phone]     Date/time of admission: 08/05/2020  7:53 PM  Time spent with patient:    CC/Reason for Consult: opioid dependence, wants to quit    HPI: Alexander [name] is a 34 y.o. year old male with h/o AUD and OUD with addiction consult for opioid dependence.     Pt well known to this physician from multiple prior presentations and connection to [facility] in the past. Pt reports that after discharge from hospital in May, he returned to use and had returned to use at about the same levels he had been using previously. He reports using at least 6 24oz cans of malt liquor daily. His last use was the morning prior to presentation. He has a history of Sz related to alcohol withdrawal. He reports  that he had cut back on his heroin/fentanyl use since he last left the hospital. Has been using 1g/day via IV. He last used the evening prior to presentation (8/31 in the evening). He reports that he needs to wait at least 72 hours from last use in order to get started on buprenorphine.     Pt reports interest in [facility] program. Had reached out to them about 1.5 weeks ago and has not had contact with them since. He also reports interest in methadone, but notes that he does not have his ID due to it being stolen/lost. Less interested in buprenorphine, but open to it if methadone is not an option.       Substance Treatment: h/o of suboxone treatment in the past. Suboxone treatment recently limited to hospital presentations. Had been connected to [facility] for [facility]one, but struggled with dosage concerns due to alcohol use and left program due to trying to pursue residential programs (most do not accept patients on methadone.)    Past Psychiatric History:    Prior diagnoses: PTSD   Prior inpatient psychiatric hospitalizations: 2015   Outpatient Treatment: none currently, previously working with Dr Lawerance Sabal at [facility]      Past Medical History:      Past Medical History:   Diagnosis Date   ??? Anxiety    ??? Bipolar disorder (CMS Dx)    ???  Bronchitis    ??? Depression    ??? Drug abuse and dependence (CMS Dx)    ??? H/O tooth extraction    ??? Hepatitis C    ??? HIV infection (CMS Dx)    ??? Liver disease Hep C   ??? PTSD (post-traumatic stress disorder)    ??? Substance induced mood disorder (CMS Dx)    ??? Thyroid disease        Past Surgical History:     Past Surgical History:   Procedure Laterality Date   ??? MANDIBLE SURGERY         Social/Developmental History:    Pt has his own apartment, but notes that he spends most nights on the streets and isn't home all that often recently. He is unemployed. He has some social support through family. No current sober support system.     Family History:      Family History   Problem Relation Age of  Onset   ??? Drug abuse Brother    ??? Suicidality Brother    ??? Hypertension Mother    ??? Transient ischemic attack Mother    ??? Seizures Father    ??? Hypertension Father    ??? Cancer Father    ??? Breast Cancer Father    ??? Suicidality Father    ??? Drug abuse Brother    ??? Suicidality Brother         Allergies: No Known Allergies    Home Medications:   No current facility-administered medications on file prior to encounter.     Current Outpatient Medications on File Prior to Encounter   Medication Sig Dispense Refill   ??? metoprolol tartrate (LOPRESSOR) 25 MG tablet Take 25 mg by mouth 2 times a day.            ??? TRIUMEQ 600-50-300 mg Tab TAKE ONE TABLET BY MOUTH DAILY 30 tablet 1   ??? folic acid (FOLVITE) 1 MG tablet          Current Medications ordered:  Scheduled Meds:  ??? abacavir-dolutegravir-lamivud  1 tablet Oral Daily 0900   ??? diazePAM  5 mg Oral TID    Or   ??? diazePAM  5 mg Intravenous TID   ??? folic acid  1 mg Oral Daily 0900    And   ??? multivitamin with folic acid  1 tablet Oral Daily 0900    And   ??? thiamine  100 mg Oral Daily 0900   ??? heparin  5,000 Units Subcutaneous 3 times per day   ??? LORazepam  1 mg Oral Once   ??? metoprolol tartrate  25 mg Oral BID   ??? nicotine  1 patch Transdermal Daily 0900   ??? triamcinolone   Topical BID     Continuous Infusions:  PRN Meds:.cloNIDine HCL, cyclobenzaprine, ibuprofen, loperamide, LORazepam **OR** LORazepam, nicotine **AND** nicotine (polacrilex), proMETHazine    ROS: Reports anxiety, tremor, nausea. Denies trouble with fever, rash, vision changes, chest pain, shortness of breath, extremity pain, weakness, dysuria.    OBJECTIVE:  Vitals:  Marland Kitchen  Vitals:    08/06/20 0029 08/06/20 0133 08/06/20 0530 08/06/20 0855   BP: (!) 161/98 155/89 (!) 136/97 (!) 147/104   BP Location:  Right arm Right arm Right arm   Patient Position:  Lying Lying Lying   Pulse: 85 102 72 72   Resp: 21 22 20 20    Temp: 99.5 ??F (37.5 ??C) 99 ??F (37.2 ??C) 97.9 ??F (36.6 ??C) 97.6 ??F (36.4 ??C)  TempSrc: Oral Oral Oral  Axillary   SpO2: 94% 96% 98% 98%       Mental Status Exam:   Gait/Station: not assessed  Gen/Behav: WD. Appears approx stated age wearing black shorts and no shirt. Lying in bed on his side. Disheveled. Poor dentition. Calm and cooperative with assessment. Makes appropriate eye contact. Tremulous in hands and face. Tends to keep his arms folded, but tremor noted when gesturing.    Speech: nl rate, volume and production.   Mood/Affect: anxious/constricted, congruent  Thought process:  linear and goal directed, no FOI, no LOA  Thought content: denies SI/HI. No delusions voiced  Perceptions: denies A/V hallucinations  Cognition: A&Ox4, recent and remote memory grossly intact, fund of knowledge is average, attention and concentration appropriate to situation.  Judgment & Insight:   fair/partial    OARRS: No flowsheet data found.    Labs:   Complete Blood Count:  Lab Results   Component Value Date    WBC 5.5 08/06/2020    RBC 5.34 08/06/2020    HGB 15.9 08/06/2020    HCT 46.8 08/06/2020    MCV 87.6 08/06/2020    MCH 29.8 08/06/2020    MCHC 34.0 08/06/2020    RDW 15.3 (H) 08/06/2020    PLT 134 (L) 08/06/2020        Basic Metabolic Panel:  Lab Results   Component Value Date    GLUCOSE 86 08/06/2020    BUN 5 (L) 08/06/2020    CO2 29 08/06/2020    CREATININE 0.60 08/06/2020    K 3.9 08/06/2020    NA 136 08/06/2020    CL 93 (L) 08/06/2020    CALCIUM 9.9 08/06/2020        Hepatic Panel:  Lab Results   Component Value Date    AST 126 (H) 08/06/2020    ALT 61 (H) 08/06/2020    ALKPHOS 134 (H) 08/06/2020         UDS available for review: No;     Imaging:

## 2020-08-07 LAB — LIPASE: Lipase: 57 U/L (ref 4–82)

## 2020-08-07 LAB — CBC
Hematocrit: 43.7 % (ref 38.5–50.0)
Hemoglobin: 15.1 g/dL (ref 13.2–17.1)
MCH: 29.9 pg (ref 27.0–33.0)
MCHC: 34.4 g/dL (ref 32.0–36.0)
MCV: 86.8 fL (ref 80.0–100.0)
MPV: 9.1 fL (ref 7.5–11.5)
Platelets: 129 10*3/uL (ref 140–400)
RBC: 5.04 10*6/uL (ref 4.20–5.80)
RDW: 15 % (ref 11.0–15.0)
WBC: 5.9 10*3/uL (ref 3.8–10.8)

## 2020-08-07 LAB — HEPATIC FUNCTION PANEL
ALT: 51 U/L (ref 7–52)
AST: 85 U/L (ref 13–39)
Albumin: 3.6 g/dL (ref 3.5–5.7)
Alkaline Phosphatase: 115 U/L (ref 36–125)
Bilirubin, Direct: 0.3 mg/dL (ref 0.00–0.40)
Bilirubin, Indirect: 0.6 mg/dL (ref 0.00–1.10)
Total Bilirubin: 0.9 mg/dL (ref 0.0–1.5)
Total Protein: 7.6 g/dL (ref 6.4–8.9)

## 2020-08-07 LAB — RENAL FUNCTION PANEL W/EGFR
Albumin: 3.6 g/dL (ref 3.5–5.7)
Anion Gap: 12 mmol/L (ref 3–16)
BUN: 6 mg/dL (ref 7–25)
CO2: 30 mmol/L (ref 21–33)
Calcium: 9.5 mg/dL (ref 8.6–10.3)
Chloride: 92 mmol/L (ref 98–110)
Creatinine: 0.27 mg/dL (ref 0.60–1.30)
Glucose: 137 mg/dL (ref 70–100)
Osmolality, Calculated: 278 mOsm/kg (ref 278–305)
Phosphorus: 2.8 mg/dL (ref 2.1–4.7)
Potassium: 3.4 mmol/L (ref 3.5–5.3)
Sodium: 134 mmol/L (ref 133–146)
eGFR AA CKD-EPI: >90 See note.
eGFR NONAA CKD-EPI: >90 See note.

## 2020-08-07 LAB — PROTIME-INR
INR: 1.1 (ref 0.9–1.1)
Protime: 14 seconds (ref 12.1–15.1)

## 2020-08-07 LAB — MAGNESIUM: Magnesium: 1.6 mg/dL (ref 1.5–2.5)

## 2020-08-07 MED ORDER — LORazepam (ATIVAN) injection 2 mg
2 | INTRAMUSCULAR | Status: AC | PRN
Start: 2020-08-07 — End: 2020-08-07
  Administered 2020-08-07: 09:00:00 2 mg via INTRAVENOUS

## 2020-08-07 MED ORDER — proMETHazine (PHENERGAN) injection 12.5 mg
25 | Freq: Four times a day (QID) | INTRAMUSCULAR | Status: AC | PRN
Start: 2020-08-07 — End: 2020-08-07

## 2020-08-07 MED ORDER — ondansetron (ZOFRAN) injection 4 mg
4 | Freq: Once | INTRAMUSCULAR | Status: AC
Start: 2020-08-07 — End: 2020-08-07
  Administered 2020-08-07: 05:00:00 4 mg via INTRAVENOUS

## 2020-08-07 MED ORDER — LORazepam (ATIVAN) tablet 2 mg
1 | Freq: Once | ORAL | Status: AC
Start: 2020-08-07 — End: 2020-08-07
  Administered 2020-08-07: 15:00:00 2 mg via ORAL

## 2020-08-07 MED ORDER — naloxone (NARCAN) 4 mg/actuation Spry
4 | NASAL | 0 refills | Status: AC | PRN
Start: 2020-08-07 — End: ?

## 2020-08-07 MED ORDER — potassium chloride (KLOR-CON M20) CR tablet 40 mEq
20 | Freq: Once | ORAL | Status: AC
Start: 2020-08-07 — End: 2020-08-07
  Administered 2020-08-07: 13:00:00 40 meq via ORAL

## 2020-08-07 MED ORDER — LORazepam (ATIVAN) tablet 4 mg
2 | Freq: Once | ORAL | Status: AC
Start: 2020-08-07 — End: 2020-08-07

## 2020-08-07 MED ORDER — LORazepam (ATIVAN) injection 1 mg
2 | INTRAMUSCULAR | Status: AC | PRN
Start: 2020-08-07 — End: 2020-08-07
  Administered 2020-08-07: 05:00:00 1 mg via INTRAVENOUS

## 2020-08-07 MED ORDER — LORazepam (ATIVAN) injection 2 mg
2 | INTRAMUSCULAR | Status: AC | PRN
Start: 2020-08-07 — End: 2020-08-07
  Administered 2020-08-07: 13:00:00 2 mg via INTRAVENOUS

## 2020-08-07 MED ORDER — LORazepam (ATIVAN) injection 4 mg
2 | INTRAMUSCULAR | Status: AC | PRN
Start: 2020-08-07 — End: 2020-08-07

## 2020-08-07 MED FILL — LORAZEPAM 2 MG/ML INJECTION SOLUTION: 2 2 mg/mL | INTRAMUSCULAR | Qty: 1

## 2020-08-07 MED FILL — LOPERAMIDE 2 MG CAPSULE: 2 2 mg | ORAL | Qty: 1

## 2020-08-07 MED FILL — TRIUMEQ 600 MG-50 MG-300 MG TABLET: 600-50-300 600-50-300 mg | ORAL | Qty: 1

## 2020-08-07 MED FILL — ONDANSETRON HCL (PF) 4 MG/2 ML INJECTION SOLUTION: 4 4 mg/2 mL | INTRAMUSCULAR | Qty: 2

## 2020-08-07 MED FILL — METOPROLOL TARTRATE 25 MG TABLET: 25 25 MG | ORAL | Qty: 1

## 2020-08-07 MED FILL — HEPARIN (PORCINE) 5,000 UNIT/ML INJECTION SOLUTION: 5000 5,000 unit/mL | INTRAMUSCULAR | Qty: 1

## 2020-08-07 MED FILL — KLOR-CON M20 MEQ TABLET,EXTENDED RELEASE: 20 20 mEq | ORAL | Qty: 2

## 2020-08-07 MED FILL — DIAZEPAM 5 MG TABLET: 5 5 MG | ORAL | Qty: 1

## 2020-08-07 MED FILL — THERA 400 MCG TABLET: 400 400 mcg | ORAL | Qty: 1

## 2020-08-07 MED FILL — FOLIC ACID 1 MG TABLET: 1 1 MG | ORAL | Qty: 1

## 2020-08-07 MED FILL — NICOTINE 14 MG/24 HR DAILY TRANSDERMAL PATCH: 14 14 mg/24 hr | TRANSDERMAL | Qty: 1

## 2020-08-07 MED FILL — LORAZEPAM 1 MG TABLET: 1 1 MG | ORAL | Qty: 2

## 2020-08-07 MED FILL — VITAMIN B-1 (MONONITRATE) 100 MG TABLET: 100 100 mg | ORAL | Qty: 1

## 2020-08-07 NOTE — Unmapped (Signed)
Patient left facility against medical advise. Team aware. Patient tool all his personal belongings with him.Ambulate off the unit independently.

## 2020-08-07 NOTE — Unmapped (Signed)
Alcohol Withdrawal Syndrome  Alcohol withdrawal syndrome is a group of symptoms that can develop when a person who drinks heavily and regularly stops drinking or drinks less. Alcohol withdrawal syndrome can be mild or severe, and it may even be life-threatening.  Alcohol withdrawal syndrome usually affects people who have alcohol use disorder, which may also be called alcoholism. Alcohol use disorder is when a person is unable to control his or her alcohol use, and drinking too much or too often causes problems at home, at work, or in relationships.  What are the causes?  Drinking heavily and drinking on a regular basis cause changes in brain chemistry. Over time, the body becomes dependent on alcohol. When alcohol use stops, the chemistry system in the brain becomes unbalanced and causes the symptoms of alcohol withdrawal.  What increases the risk?  Alcohol withdrawal syndrome is more likely to occur in people who drink more than the recommended limit of alcohol (2 drinks a day for men or 1 drink a day for non-pregnant women). It is also more likely to affect heavy drinkers who have been using alcohol for long periods of time. The more a person drinks and the longer he or she drinks, the greater the risk of alcohol withdrawal syndrome.  Severe withdrawal is more likely to develop in someone who:  ?? Had severe alcohol withdrawal in the past.  ?? Had a seizure during a previous episode of alcohol withdrawal.  ?? Is elderly.  ?? Uses other drugs.  ?? Has a long-term (chronic) medical problem, such as heart, lung, or liver disease.  ?? Has depression.  ?? Does not get enough nutrients from his or her diet (malnutrition).  What are the signs or symptoms?  Symptoms of this condition can be mild to moderate, or they can be severe. Symptoms may develop a few hours (or up to a day) after a person changes his or her drinking patterns. During the 48 hours after he or she has stopped drinking, the following symptoms may go away or  get better:  ?? Uncontrollable shaking (tremor).  ?? Sweating.  ?? Headache.  ?? Anxiety.  ?? Inability to relax (agitation).  ?? Trouble sleeping (insomnia).  ?? Irregular heartbeats (palpitations).  ?? Alcohol cravings.  ?? Seizure.  The following symptoms may get worse 24-48 hours after a person has decreased or stopped alcohol use, and they may gradually improve over a period of days or weeks:  ?? Nausea and vomiting.  ?? Fatigue.  ?? Sensitivity to light and sounds.  ?? Confusion and inability to think clearly.  ?? Loss of appetite.  ?? Mood swings, irritability, depression, and anxiety.  ?? Insomnia and nightmares.  The following symptoms are severe and life-threatening. When these symptoms occur together, they are called delirium tremens (DTs):  ?? High blood pressure.  ?? Increased heart rate.  ?? Trouble breathing.  ?? Seizures. These may go away along with other symptoms, or they may persist.  ?? Seeing, hearing, feeling, smelling, or tasting things that are not there (hallucinations). If you experience hallucinations, they usually begin 12-24 hours after a change in drinking patterns.  Delirium tremens requires immediate hospitalization.  How is this diagnosed?  This condition may be diagnosed based on:  ?? Your symptoms and medical history.  ?? Your history of alcohol use. Your health care provider may ask questions about your drinking behavior. It is important to be honest when you answer these questions.  ?? A psychological assessment.  ?? A  physical exam.  ?? Blood tests or urine tests to measure blood alcohol level and to rule out other causes of symptoms.  ?? MRI or CT scan. This may be done if you seem to have abnormal thinking or behaviors (altered mental status).  Diagnosis can be difficult. People going through withdrawal often avoid seeking medical care and are not thinking clearly. Friends and family members play an important role in recognizing symptoms and encouraging loved ones to get treatment.  How is this  treated?  Most people with symptoms of withdrawal can be treated outside of a hospital setting (outpatient treatment), with close monitoring such as daily check-ins with a health care provider and counseling. You may need treatment at a hospital or treatment center (inpatient treatment) if:  ?? You have a history of delirium tremens or seizures.  ?? You have severe symptoms.  ?? You are addicted to other drugs.  ?? You cannot swallow medicine.  ?? You have a serious medical condition such as heart failure.  ?? You experienced withdrawal in the past but then you continued drinking alcohol.  ?? You are not likely to commit to an outpatient treatment schedule.  Treatment may involve:  ?? Monitoring your blood pressure, pulse, and breathing.  ?? IV fluids to keep you hydrated.  ?? Medicines to reduce withdrawal symptoms and discomfort (benzodiazepines).  ?? Medicine to reduce anxiety.  ?? Medicine to prevent or control seizures.  ?? Multivitamins and B vitamins.  ?? Having a health care provider check on you daily.  It is important to get treatment for alcohol withdrawal early. Getting treatment early can:  ?? Speed up your recovery from withdrawal symptoms.  ?? Make you more successful with long-term stoppage of alcohol use (sobriety).  If you need help to stop drinking, your health care provider may recommend a long-term treatment plan that includes:  ?? Medicines to help treat alcohol use disorder.  ?? Substance abuse counseling.  ?? Support groups.  Follow these instructions at home:    ?? Take over-the-counter and prescription medicines (including vitamin supplements) only as told by your health care provider.  ?? Do not drink alcohol.  ?? Do not drive until your health care provider approves.  ?? Have someone you trust stay with you or be available if you need help with your symptoms or with not drinking.  ?? Drink enough fluid to keep your urine pale yellow.  ?? Consider joining an alcohol support group or treatment program. These can  provide emotional support, advice, and guidance.  ?? Keep all follow-up visits as told by your health care provider. This is important.  Contact a health care provider if:  ?? Your symptoms get worse instead of better.  ?? You cannot eat or drink without vomiting.  ?? You are struggling with not drinking alcohol.  ?? You cannot stop drinking alcohol.  Get help right away if:  ?? You have an irregular heartbeat.  ?? You have chest pain.  ?? You have trouble breathing.  ?? You have a seizure for the first time.  ?? You hallucinate.  ?? You become very confused.  Summary  ?? Alcohol withdrawal is a group of symptoms that can develop when a person who drinks heavily and regularly stops drinking or drinks less.  ?? Symptoms of this condition can be mild to moderate, or they can be severe.  ?? Treatment may include hospitalization, medicine, and counseling.  This information is not intended to replace advice given to  you by your health care provider. Make sure you discuss any questions you have with your health care provider.  Document Revised: 11/03/2017 Document Reviewed: 07/28/2017  Elsevier Patient Education ?? 2021 [employer]      Chemical Dependency  Chemical dependency is an addiction to drugs or alcohol. People with this addiction repeatedly seek out and use drugs or alcohol despite negative consequences to the health and safety of themselves and others.  Addiction changes the way the brain works. Because of these changes, addiction is a chronic condition. The medical term for addiction or chemical dependency is substance use disorder. The disorder can be mild, moderate, or severe.  People can be dependent on a range of substances. These include alcohol, prescription medicines, and illegal or street drugs, such as marijuana, heroin, and cocaine.  What are the causes?  This condition is caused by the effect that the abused substance has on the brain.  What increases the risk?  The following factors may make you more likely to  develop this condition:  ?? Having??a family history of chemical dependency.  ?? Having mental health issues, such as depression, anxiety, or bipolar disorder.  ?? Living in an environment where drugs and alcohol are easily available.  ?? Using drugs or alcohol at a young age.  ?? Having friends who use drugs or alcohol.  ?? Having poor social skills.  ?? Tending to be aggressive or impulsive.  What are the signs or symptoms?  Symptoms may vary depending on the substance that you are addicted to. Symptoms may include the following:  Physical Symptoms  ?? The inability to limit the use of drugs or alcohol.  ?? Having nausea, sweating, shakiness, and anxiety when you are not using alcohol or drugs.  ?? Needing a greater amount of drugs or alcohol to get the same effect (developing tolerance).  ?? A change in:  ?? Sleeping habits.  ?? Eating or appetite.  ?? Appearance or how you care for yourself.  Emotional Symptoms  ?? Angry outbursts.  ?? Periods of sadness and tearfulness.  ?? Isolation.  Relationship Problems  ?? Loved ones suggesting that you have a problem.  ?? Increased fights.  ?? Forgetting commitments.  ?? Having affairs or one-night stands.  Problems Related to Irresponsibility  ?? Legal problems.  ?? Irresponsibility with money.  ?? Missing work.  ?? Poor decision making.  How is this diagnosed?  This condition may be diagnosed based on your symptoms, your medical history, and a physical exam. You may also have blood tests and urine tests.  How is this treated?  Treatment for this condition depends on the substance that you are addicted to and whether your dependency is mild, moderate, or severe. Treatment options may include:  ?? Stopping substance use safely. This may require taking medicines and being closely observed for several days.  ?? Taking part in group and individual counseling with mental health providers who help people with chemical dependency.  ?? Staying at a residential treatment center for several days or  weeks.  ?? Attending daily counseling sessions at a treatment center.  ?? Taking medicine as told by your health care provider to:  ?? Ease symptoms and prevent complications during withdrawal.  ?? Treat other mental health issues, such as depression or anxiety.  ?? Block cravings by causing the same effects as the substance.  ?? Block the effects of the substance or replace good sensations with unpleasant ones.  ?? Going to a support group  to share your experience with others who are going through the same thing. These groups are an important part of long-term recovery for many people. They include 12-step programs like Alcoholics Anonymous and Narcotics Anonymous.  Recovery can be a long process. Many people who undergo treatment start using the substance again after stopping. This is called a relapse. If you have a relapse, it does not mean that treatment will not work.  Follow these instructions at home:  ?? Avoid temptations or triggers that you associate with your use of the substance.  ?? Learn and practice techniques for managing stress.  ?? Have a plan for vulnerable moments.  ?? Get phone numbers of those who are willing to help and who are committed to your recovery.  ?? Know when and where the meetings that you have chosen will occur.  ?? Take over-the-counter and prescription medicines only as told by your health care provider.  ?? Keep all follow-up visits as told by your health care provider. This is important.  Contact a health care provider if:  ?? You cannot take your medicines as told.  ?? Your symptoms get worse.  ?? You have trouble resisting the urge to use drugs or alcohol.  ?? You are in pain, shaking, sweating, or feeling generally unwell.  ?? You are losing weight without trying to.  Get help right away if:  ?? You lose consciousness.  ?? Your breathing is slow.  ?? Your pulse is slow or jumpy.  ?? You have serious thoughts about hurting yourself or someone else.  ?? You have a relapse.  This information is not  intended to replace advice given to you by your health care provider. Make sure you discuss any questions you have with your health care provider.  Document Released: 11/15/2001 Document Revised: 12/28/2015 Document Reviewed: 07/29/2015  Elsevier Interactive Patient Education ?? 2018 [employer].

## 2020-08-07 NOTE — Unmapped (Signed)
Nursing Overnight Progress Note    Significant Events During Shift  CIWA =16 and 20, see flow sheet   Pt reported pain in the chest, ankle, abdomen and head.   Reported nausea and vomited x4 during the day. Unable to keep food in the stomach   RN noted- restlessness, sweating, tremors both lower and upper extremities   RN notified team and suggested IV antiemetic instead of the current po   Pt received IV Zofran and 1 mg of ativan with effective results. Sleeping no agitation.   Elevated blood pressure      Vitals:    08/06/20 2222 08/07/20 0029 08/07/20 0459 08/07/20 0533   BP: (!) 151/93 140/75 (!) 153/100    BP Location: Right arm Right arm Right arm    Patient Position: Lying Lying Lying    Pulse: 81 72 94    Resp: 18 18 18     Temp: 99.1 ??F (37.3 ??C) 98.7 ??F (37.1 ??C) 98 ??F (36.7 ??C)    TempSrc: Oral Oral Oral    SpO2: 100% 97% 100%    Weight:    135 lb 3.2 oz (61.3 kg)   Height:               Patient/Family Concerns  Evening/Overnight Visitors: none  Concerns: none    Assessment  Nursing time demands: high  IV access: has IV access, adequate and functioning  Sitter requirements:  no    Mental Status  Mental Status for the past 14 hrs:   Level of Consciousness Orientation Level Cognition   08/06/20 1700 Alert Oriented X4 Ability to abstract   08/07/20 0140 Alert Oriented to person Appropriate judgement;Appropriate safety awareness       Calls to Physician Team  Calls for the past 14 hrs:   Reason for Communication Notification Details Provider Name Provider Role Method of Communication Response Time Attempted Contact With Provider Time Response Received From Provider   08/07/20 0140 Change in status Provider Aware green team Resident Page See orders [id number]       Medications  [phone] - Medications Not Given  (last 12 hrs)         ** SITE UNKNOWN **       Medication Name Action Time Action Reason Comments     heparin (porcine) injection 5,000 Units 08/06/20 2040 Not Given Patient/family refused                  Diet/Meals Consumed (past 24 hours)  No data found.

## 2020-08-07 NOTE — Unmapped (Signed)
[name]  Inpatient Discharge Summary     Left Against Medical Advice       Patient: Alexander [name]  Age: 34 y.o.    MRN: [id number]   CSN: [id number]    Date of Admission: 08/05/2020  Date of Discharge: 08/07/2020  Attending Physician: Alexander Bouton, MD  Primary Care Physician: Alexander Pun, MD     Diagnoses Present on Admission     Past Medical History:   Diagnosis Date   ??? Anxiety    ??? Bipolar disorder (CMS Dx)    ??? Bronchitis    ??? Depression    ??? Drug abuse and dependence (CMS Dx)    ??? H/O tooth extraction    ??? Hepatitis C    ??? HIV infection (CMS Dx)    ??? Liver disease Hep C   ??? PTSD (post-traumatic stress disorder)    ??? Substance induced mood disorder (CMS Dx)    ??? Thyroid disease         Discharge Diagnoses     Active Hospital Problems    Diagnosis Date Noted   ??? Alcohol withdrawal (CMS Dx) [F10.239] 02/01/2020      Resolved Hospital Problems   No resolved problems to display.       Operations/Procedures Performed (include dates)     Surgeries:  None    Lines and tubes:  Patient Lines/Drains/Airways Status    Active Line / PIV Line     Name:   Placement date:   Placement time:   Site:   Days:    Peripheral IV 08/05/20 Left Forearm   08/05/20    2021    Forearm   2         NHSN Device Days (07/09/2020 to 08/07/2020)     Central line: 0    Urinary catheter: 0                Other Procedures / Pertinent Imaging:  Results for orders placed during the hospital encounter of 08/05/20    US Abdomen Limited    Narrative  EXAM: US ABDOMEN LIMITED    INDICATION:  RUQ pain    DATE: 08/06/2020 12:58 PM EDT    COMPARISON:  Outside CT abdomen and pelvis report 05/18/2019    TECHNIQUE: Grayscale imaging was performed for evaluation of the liver, gallbladder, common bile duct, pancreas, spleen, and kidneys; with limited grayscale and color Doppler evaluation of the upper abdominal aorta and inferior vena cava.    FINDINGS:    Liver: Diffusely increased echogenicity, consistent with hepatocellular disease to include hepatic  steatosis. Due to the increased echogenicity, there is limited penetration of the deeper portions of the liver. 0.7 x 0.5 cm anechoic lesion with enhanced through transmission in the anterior right lobe, consistent with a hepatic cyst.    Biliary/CBD: 4.0 mm. No intra or extrahepatic ductal dilatation.    Gallbladder: Sludge is seen in the gallbladder. No stones, wall thickening or pericholecystic fluid.    Pancreas: Visualized portions are unremarkable.    Right kidney: 12.3 cm in length.  Normal parenchymal echogenicity.  No hydronephrosis.    Aorta and IVC: Visualized portions of the abdominal aorta are not aneurysmal. The retrohepatic IVC is normal.      IMPRESSION:    1.  Increased liver echogenicity consistent with hepatocellular disease to include hepatic steatosis.  2.  Gallbladder sludge.      Approved by Alexander Shy, DO on 08/06/2020 2:45 PM EDT  I have personally reviewed the images and I agree with this report.    Report Verified by: Alexander Keens, MD at 08/06/2020 8:01 PM EDT      Consulting Services (include reason)      IP CONSULT TO ADDICTION SERVICES  IP CONSULT TO NUTRITION SERVICES    Allergies     No Known Allergies    Discharge Medications        Medication List      TAKE these medications, which are NEW      Quantity/Refills   naloxone 4 mg/actuation Spry  Commonly known as: NARCAN  Apply 1 spray in one nostril if needed. Call 911. May repeat dose in other nostril if no response in 3 minutes.   Quantity: 2 each  Refills: 0        TAKE these medications, which you were ALREADY TAKING      Quantity/Refills   metoprolol tartrate 25 MG tablet  Commonly known as: LOPRESSOR  Take 25 mg by mouth 2 times a day.    Refills: 0     Triumeq 600-50-300 mg Tab  Generic drug: abacavir-dolutegravir-lamivud  TAKE ONE TABLET BY MOUTH DAILY   Quantity: 30 tablet  Refills: 1           Where to Get Your Medications      These medications were sent to [facility] [place], [place] - [address]  [address], [place] [address]    Phone: [phone]   ?? naloxone 4 mg/actuation Spry             Discharge Exam     Physical Exam  Constitutional:       Appearance: He is ill-appearing.      Comments: Uncomfortable appearing, in active withdrawals   HENT:      Head: Normocephalic and atraumatic.      Nose: No congestion or rhinorrhea.      Mouth/Throat:      Mouth: Mucous membranes are moist.   Eyes:      General: No scleral icterus.     Extraocular Movements: Extraocular movements intact.   Cardiovascular:      Rate and Rhythm: Normal rate and regular rhythm.      Pulses: Normal pulses.      Heart sounds: Normal heart sounds.   Pulmonary:      Effort: Pulmonary effort is normal.      Breath sounds: Normal breath sounds.   Abdominal:      General: There is no distension.      Palpations: Abdomen is soft. There is no mass.      Tenderness: There is no abdominal tenderness.   Musculoskeletal:         General: Tenderness present. Normal range of motion.      Comments: Tenderness and myalgias in all extremities, chest, and back   Skin:     General: Skin is warm and dry.      Findings: No bruising.   Neurological:      Mental Status: He is alert and oriented to person, place, and time.      Comments: High amplitude withdrawal tremors throughout   Psychiatric:      Comments: Highly anxious, pacing the room, psychomotor agitation           Reason for Admission     Alexander [name] is a 34 y.o. male with PMH of HIV, HCV, HTN, polysubstance abuse who presented with  EtOH withdrawal.       Hospital Course     Active Hospital Problems    Diagnosis Date Noted   ??? Alcohol withdrawal (CMS Dx) [F10.239] 02/01/2020      Resolved Hospital Problems   No resolved problems to display.     #Alcohol withdrawal  #Opioid withdrawal  #Polysubstance abuse  Patient presented to the ED for alcohol and opioid withdrawals. Tremulous, tachycardic, chest pain, RUQ pain, nausea, vomiting, and diarrhea. Received fluids, versed, and valium in the  ED. Admitted to Internal Medicine. CIWA protocols initiated, scheduled Valium 5 mg TID and prn Ativan 2-4mg . Addiction medicine was consulted and discussed quitting goals and medication assistance with patient. On hospital day 2, patient remained tremulous, restless, and anxious, stating that he needed to leave to help his brother with something which he didn't want to discuss. Risks of leaving against medical advice including seizures and death were discussed. He was able to summarize this in his own words. Patient chose to leave AMA. Given prescription for Narcan prior to leaving.     #RUQ pain  Patient complained of RUQ pain. LFTs elevated. RUQ ultrasound demonstrated increased liver echogenicity consistent with hepatic steatosis and gallbladder sludge. LFTs downtrending and no further work-up performed at time of patient leaving AMA.    #HTN  Continued home metoprolol.    #HIV  Follows with Dr. Merilynn Finland. Undetectable viral quant and CD4 471 in 01/2020. Continued home abacavir-dolutegravir-lamivud.    #HCV  Untreated. Follows with Dr. Merilynn Finland. Per clinic note, will require 6 months of sobriety before starting HCV treatment.      Condition on Discharge     1. Functional Status: normal   Describe limitations, if any: None    2. Mental Status: Alert/Oriented   Describe limitations, if any: Anxious, restless, in active alcohol and opioid withdrawal.    3. Dietary Restrictions / Tube Feeding / TPN  Diet Orders          None        Regular Diet    4. Discharge specific orders:   None required    5. Core measures followed: (if this is a core measure patient)  Discharge Weight: 135 lb 3.2 oz (61.3 kg)            Disposition     Home independent   Against Medical Advice       Follow-Up Appointments     No future appointments.  Alexander Pun, MD  [address] [place] [address]  [phone]            Signed:    Nemiah Commander, MD  08/08/2020, 6:59 AM

## 2020-08-07 NOTE — Unmapped (Signed)
Problem: Fall Prevention  Goal: Patient will remain free of falls  Description: Assess and monitor vitals signs, neurological status including level of consciousness and orientation.  Reassess fall risk per hospital policy.    Ensure arm band on, uncluttered walking paths in room, adequate room lighting, call light and overbed table within reach, bed in low position, wheels locked, side rails up per policy (excluding SNF), and non-skid footwear provided.   Outcome: Progressing     Problem: Compromised Skin Integrity  Description: Use this problem when pressure ulcers are classified as stage I or II.  Goal: Skin integrity is maintained or improved  Description: Assess and monitor skin integrity. Identify patients at risk for skin breakdown on admission and per policy. Collaborate with interdisciplinary team and initiate plans and interventions as needed.  Outcome: Progressing     Problem: Daily Care  Goal: Daily care needs are met  Description: Assess and monitor ability to perform self care and identify potential discharge needs.  Outcome: Progressing

## 2020-10-02 ENCOUNTER — Emergency Department: Admit: 2020-10-02 | Payer: PRIVATE HEALTH INSURANCE

## 2020-10-02 ENCOUNTER — Inpatient Hospital Stay
Admission: EM | Admit: 2020-10-02 | Discharge: 2020-10-04 | Discharge status: Against Medical Advice | Payer: PRIVATE HEALTH INSURANCE

## 2020-10-02 LAB — URINALYSIS-MACROSCOPIC W/REFLEX TO MICROSCOPIC
Bilirubin, UA: NEGATIVE
Blood, UA: NEGATIVE
Glucose, UA: NEGATIVE mg/dL
Ketones, UA: NEGATIVE mg/dL
Leukocytes, UA: NEGATIVE
Nitrite, UA: NEGATIVE
Protein, UA: NEGATIVE mg/dL
Specific Gravity, UA: 1.02 (ref 1.005–1.035)
Urobilinogen, UA: 0.2 EU/dL (ref 0.2–1.0)
pH, UA: 6 (ref 5.0–8.0)

## 2020-10-02 LAB — CBC
Hematocrit: 45.6 % (ref 38.5–50.0)
Hemoglobin: 15.1 g/dL (ref 13.2–17.1)
MCH: 28 pg (ref 27.0–33.0)
MCHC: 33.2 g/dL (ref 32.0–36.0)
MCV: 84.5 fL (ref 80.0–100.0)
MPV: 8.1 fL (ref 7.5–11.5)
Platelets: 177 10*3/uL (ref 140–400)
RBC: 5.4 10*6/uL (ref 4.20–5.80)
RDW: 14.2 % (ref 11.0–15.0)
WBC: 4.9 10*3/uL (ref 3.8–10.8)

## 2020-10-02 LAB — LIPASE: Lipase: 80 U/L (ref 4–82)

## 2020-10-02 LAB — DIFFERENTIAL
Basophils Absolute: 34 /uL (ref 0–200)
Basophils Relative: 0.7 % (ref 0.0–1.0)
Eosinophils Absolute: 44 /uL (ref 15–500)
Eosinophils Relative: 0.9 % (ref 0.0–8.0)
Lymphocytes Absolute: 2411 /uL (ref 850–3900)
Lymphocytes Relative: 49.2 % (ref 15.0–45.0)
Monocytes Absolute: 583 /uL (ref 200–950)
Monocytes Relative: 11.9 % (ref 0.0–12.0)
Neutrophils Absolute: 1828 /uL (ref 1500–7800)
Neutrophils Relative: 37.3 % (ref 40.0–80.0)
nRBC: 0 /100 WBC (ref 0–0)

## 2020-10-02 LAB — HEPATIC FUNCTION PANEL
ALT: 117 U/L (ref 7–52)
AST: 283 U/L (ref 13–39)
Albumin: 4 g/dL (ref 3.5–5.7)
Alkaline Phosphatase: 120 U/L (ref 36–125)
Bilirubin, Direct: 0.24 mg/dL (ref 0.00–0.40)
Bilirubin, Indirect: 0.36 mg/dL (ref 0.00–1.10)
Total Bilirubin: 0.6 mg/dL (ref 0.0–1.5)
Total Protein: 9.1 g/dL (ref 6.4–8.9)

## 2020-10-02 LAB — URINE DRUG SCREEN WITHOUT CONFIRMATION, STAT
Amphetamine, 500 ng/mL Cutoff: NEGATIVE
Barbiturates UR, 300  ng/mL Cutoff: NEGATIVE
Benzodiazepines UR, 300 ng/mL Cutoff: NEGATIVE
Buprenorphine, 5 ng/mL Cutoff: NEGATIVE
Cocaine UR, 300 ng/mL Cutoff: POSITIVE
Fentanyl, 2 ng/mL Cutoff: POSITIVE
Methadone, UR, 300 ng/mL Cutoff: NEGATIVE
Opiates UR, 300 ng/mL Cutoff: NEGATIVE
Oxycodone, 100 ng/mL Cutoff: NEGATIVE
THC UR, 50 ng/mL Cutoff: NEGATIVE
Tricyclic Antidepressants, 300 ng/mL Cutoff: NEGATIVE

## 2020-10-02 LAB — BASIC METABOLIC PANEL
Anion Gap: 11 mmol/L (ref 3–16)
BUN: 4 mg/dL (ref 7–25)
CO2: 25 mmol/L (ref 21–33)
Calcium: 9.3 mg/dL (ref 8.6–10.3)
Chloride: 97 mmol/L (ref 98–110)
Creatinine: 0.64 mg/dL (ref 0.60–1.30)
Glucose: 100 mg/dL (ref 70–100)
Osmolality, Calculated: 273 mOsm/kg (ref 278–305)
Potassium: 4.2 mmol/L (ref 3.5–5.3)
Sodium: 133 mmol/L (ref 133–146)
eGFR AA CKD-EPI: >90 See note.
eGFR NONAA CKD-EPI: >90 See note.

## 2020-10-02 LAB — HIGH SENSITIVITY TROPONIN: High Sensitivity Troponin: 4 ng/L (ref 0–20)

## 2020-10-02 MED ORDER — diazePAM (VALIUM) tablet 10 mg
5 | Freq: Once | ORAL | Status: AC
Start: 2020-10-02 — End: 2020-10-02
  Administered 2020-10-03: 02:00:00 10 mg via ORAL

## 2020-10-02 MED ORDER — electrolyte-R (pH 7.4) (NORMOSOL-R pH 7.4) iv solution SolP 1,000 mL
Freq: Once | INTRAVENOUS | Status: AC
Start: 2020-10-02 — End: 2020-10-03
  Administered 2020-10-02: 1000 mL via INTRAVENOUS

## 2020-10-02 MED FILL — DIAZEPAM 5 MG TABLET: 5 5 MG | ORAL | Qty: 2

## 2020-10-02 NOTE — Unmapped (Signed)
[facility] ED Note    Date of service:  10/02/2020    Reason for Visit: Weakness and Palpitations      Patient History     HPI:  Alexander [name] is a 34 y.o. male with PMHx of HIV, hepatitis C, depression, bipolar disorder, fentanyl use, alcohol dependence, and thyroid disease who presents with multiple complaints.     The patient presents for evaluation of multiple different symptoms.  Of note, the patient states that he injected fentanyl last night and last had a drink approximately 8 to 9 hours ago and is interested in going to rehab to stop using drugs and alcohol.  He states that he has been out of his HIV and all other medications for over 2 months and is interested in restarting these.  He has multiple complaints including cough, weakness, abdominal pain, nausea, weight loss, frequent falls, palpitations, and intermittent constipation.  All of the symptoms appear to have been ongoing for greater than 1 month.  When asked why he came in tonight, he states that he is less interested in going into rehab but has not been feeling well.  He does not feel as though he is in withdrawal right now.  He denies chest pain or shortness of breath.  He has an intermittent cough that is nonproductive.  He denies sore throat.  He denies back pain, bowel or bladder incontinence/retention.  He denies fevers or chills.  He states that he has been falling because he has been feeling generally weak.  He has not been able to keep any food down due to nausea and feeling like his abdomen is tight.  He is concerned that his liver is failing due to his hepatitis C and uncontrolled HIV.    Aside from the above, patient denies any aggravating or alleviating factors or associated symptoms.       Past Medical History:   Diagnosis Date   ??? Anxiety    ??? Bipolar disorder (CMS Dx)    ??? Bronchitis    ??? Depression    ??? Drug abuse and dependence (CMS Dx)    ??? H/O tooth extraction    ??? Hepatitis C     ??? HIV infection (CMS Dx)    ??? Liver disease Hep C   ??? PTSD (post-traumatic stress disorder)    ??? Substance induced mood disorder (CMS Dx)    ??? Thyroid disease        Past Surgical History:   Procedure Laterality Date   ??? MANDIBLE SURGERY         Alexander [name]  reports that he has been smoking cigarettes. He has a 11.00 pack-year smoking history. He has never used smokeless tobacco. He reports current alcohol use. He reports current drug use. Drugs: Heroin, Other-see comments, Marijuana, and Cocaine.    Previous Medications    METOPROLOL TARTRATE (LOPRESSOR) 25 MG TABLET    Take 25 mg by mouth 2 times a day.           NALOXONE (NARCAN) 4 MG/ACTUATION SPRY    Apply 1 spray in one nostril if needed. Call 911. May repeat dose in other nostril if no response in 3 minutes.    TRIUMEQ 600-50-300 MG TAB    TAKE ONE TABLET BY MOUTH DAILY       Allergies:   Allergies as of 10/02/2020   ??? (No Known Allergies)       Review of Systems     ROS:  Pertinent positives include potation's, constipation, weakness, falls, palpitations, nausea, vomiting. Pertinent negatives include no fever, chills, back pain, urinary retention. Otherwise all other systems were reviewed and were negative    Physical Exam     Vitals:    10/02/20 1901   BP: 134/89   Pulse: 77   Resp: 16   Temp: 98.5 ??F (36.9 ??C)   SpO2: 97%     General: Disheveled appearing male sitting in bed in no acute distress.  HEENT:  Normocephalic, atraumatic. Mucous membranes are moist.  Eyes: Anicteric, EOMI   Neck:  Supple, trachea midline  Pulmonary:   Lungs clear to auscultation bilaterally. No increased work of breathing or tachypnea.   Cardiac:  Regular rate and rhythm. Normal S1 and S2. No murmurs/rubs/gallops. Good distal perfusion.   Abdomen:  Soft, nondistended.  Minimally tender in the right upper quadrant.  No rebound or guarding.  Extremities: 2+ radial pulses. No extremity deformity, swelling or tenderness appreciated.  Full strength in bilateral lower  extremities.  Back: No midline tenderness palpation or CVA tenderness.  Skin:  No rashes or bruising.  Neuro:  Alert and oriented x3. Speech normal.  Full strength in all extremities.  Sensation intact in all extremities.  Psych:  Mood and affect appropriate for situation.     Diagnostic Studies     Labs:    Please see electronic medical record for any tests performed in the ED     Radiology:    Please see electronic medical record for any tests performed in the ED    EKG:    Indication palpitations,   Rate 66,   Rhythm sinus,   Interval PR 130, QRS 86, QTc 410,   Axis normal,   No ST segment elevations.  Isolated T wave inversion in lead III.  Nonischemic EKG.  No tachydysrhythmias.  Normal sinus rhythm.    Emergency Department Procedures     Procedures      ED Course and MDM     Alexander [name] is a 34 y.o. male with a history and presentation as described above in HPI.  The patient was evaluated by myself, the ED Attending Physician, Dr. Loleta Chance. All management and disposition plans were discussed and agreed upon. Appropriate labs and diagnostic studies were reviewed as they were made available. Pertinent laboratory studies in medical decision making are listed below.     Upon presentation, the patient was hemodynamically stable and in no acute distress.  He was disheveled appearing but otherwise had a benign physical exam.  His CIWA on initial presentation was 4.  Given his wide radial complaints with history of HIV and hepatitis C as well as use of multiple different drugs including injection drugs, a broad work-up was initiated.  EKG was nonischemic with no evidence of dysrhythmia.  Troponin was 4.  Repeat was sent.  Chest x-ray showed no evidence of pneumonia.  CBC showed a normal white count.  Urinalysis was normal.  Lipase was normal.  BMP was unremarkable.  LFTs showed an AST of 283 and ALT of 117.  He has a history of mild transaminitis likely related to his known alcohol use as well as hepatitis C.  He had  a biliary ultrasound at his last hospitalization which showed biliary sludge.  He has a benign abdomen.  I do not feel as though it needs a CT scan or an ultrasound at this time.  Urine drug screen showed fentanyl and cocaine.  The patient states that he  does want to stop drinking and stop using drugs and would be interested in getting in for medication assisted treatment.    On reassessment, the patient's he was again 4.  The patient was observed in the emergency department for 6 hours and continued to have CIWA scores of less than 8.  I have low concern that he will go through clinically significant alcohol withdrawal.  I discussed with social work.  It is midnight.  There are few sources for him tonight.  He was provided with the # [phone] to call in the morning to discuss housing options.  I told him that we have EAP counselors here Monday through Friday.  He is welcome to return to the emergency department during business hours on Monday through Friday to potentially get in with alcohol/drug treatment plan.  He was discharged in stable condition.    While waiting for discharge, the patient developed worsening symptoms of alcohol withdrawal.  CIWA was 16.  He was given 10 of IV Valium.  Given his multiple social complications as well as need for IV benzos for alcohol withdrawal, he will be admitted to medicine for management.      Consults:  None    Summary of Treatment in ED:    Medications   diazePAM (VALIUM) Syrg 10 mg (has no administration in time range)   electrolyte-R (pH 7.4) (NORMOSOL-R pH 7.4) iv solution SolP 1,000 mL (0 mLs Intravenous Stopped 10/03/20 0105)   diazePAM (VALIUM) tablet 10 mg (10 mg Oral Given 10/02/20 2130)           Impression     1. Malaise and fatigue    2. Drug abuse (CMS Dx)    3. Alcohol withdrawal syndrome without complication (CMS Dx)               Jonelle Sports, MD  Resident  10/03/20 [phone]

## 2020-10-02 NOTE — Unmapped (Signed)
Pt c/o abd pain and hep c flare up, and HIV flare up, feels like my liver is swollen, x 1 mos, and out of my meds, and having heart palpations, and gen'l weakness, pt is homeless also, and can't walk been falling x 2 weeks, no vision problems at present,

## 2020-10-02 NOTE — Unmapped (Signed)
ED Attending Attestation Note    Date of service:  10/02/2020    This patient was seen by the resident physician.  I have seen and examined the patient, agree with the workup, evaluation, management and diagnosis. The care plan has been discussed and I concur.  I have reviewed the EKG and agree with the resident's interpretation.     My assessment reveals a 34 y.o. male who presents to the Emergency Department with concerns for alcohol withdrawal. On exam the patient has mild tremulousness of his hands.  Very disheveled.  HIV+ and has been off of all medications for the past 2 months.

## 2020-10-02 NOTE — Unmapped (Signed)
Patient brought in for generalized weakness and palpitations. HX of hiv. Has been off all his meds.

## 2020-10-02 NOTE — Unmapped (Signed)
Also having urine problems and a growth on his penis, also been drinking a lot, and wants to stop now, recent drug use, also wants to stop them,

## 2020-10-03 LAB — COMPREHENSIVE METABOLIC PANEL
ALT: 110 U/L — ABNORMAL HIGH (ref 7–52)
AST: 266 U/L — ABNORMAL HIGH (ref 13–39)
Albumin: 3.6 g/dL (ref 3.5–5.7)
Alkaline Phosphatase: 114 U/L (ref 36–125)
Anion Gap: 9 mmol/L (ref 3–16)
BUN: 6 mg/dL — ABNORMAL LOW (ref 7–25)
CO2: 31 mmol/L (ref 21–33)
Calcium: 9.6 mg/dL (ref 8.6–10.3)
Chloride: 98 mmol/L (ref 98–110)
Creatinine: 0.65 mg/dL (ref 0.60–1.30)
Glucose: 102 mg/dL — ABNORMAL HIGH (ref 70–100)
Osmolality, Calculated: 284 mosm/kg (ref 278–305)
Potassium: 4.4 mmol/L (ref 3.5–5.3)
Sodium: 138 mmol/L (ref 133–146)
Total Bilirubin: 1 mg/dL (ref 0.0–1.5)
Total Protein: 8.3 g/dL (ref 6.4–8.9)
eGFR AA CKD-EPI: >90 See note.
eGFR NONAA CKD-EPI: >90 See note.

## 2020-10-03 LAB — INFLUENZA A AND B, COVID, RSV COMBINATION ASSAY, NAA
Date of Symptom Onset: 20211015
Influenza A: NEGATIVE
Influenza B: NEGATIVE
RSV: NEGATIVE
SARS-CoV-2: NEGATIVE

## 2020-10-03 LAB — HIGH SENSITIVITY TROPONIN: High Sensitivity Troponin: 4 ng/L (ref 0–20)

## 2020-10-03 LAB — ETHANOL, SERUM: Ethanol: <10 mg/dL (ref 0–10)

## 2020-10-03 MED ORDER — acetaminophen (TYLENOL) tablet 650 mg
325 | ORAL | Status: AC | PRN
Start: 2020-10-03 — End: 2020-10-04
  Administered 2020-10-03: 18:00:00 650 mg via ORAL

## 2020-10-03 MED ORDER — naloxone (NARCAN) injection 0.4 mg
0.4 | INTRAMUSCULAR | Status: AC | PRN
Start: 2020-10-03 — End: 2020-10-04

## 2020-10-03 MED ORDER — abacavir-dolutegravir-lamivud (TRIUMEQ) 600-50-300 mg Tab
600-50-300 | ORAL_TABLET | Freq: Every day | ORAL | 0 refills | Status: AC
Start: 2020-10-03 — End: 2020-10-28
  Filled 2020-10-03: qty 30, 30d supply, fill #0

## 2020-10-03 MED ORDER — diazePAM (VALIUM) tablet 10 mg
5 | Freq: Three times a day (TID) | ORAL | Status: AC
Start: 2020-10-03 — End: 2020-10-04
  Administered 2020-10-04: 14:00:00 10 mg via ORAL

## 2020-10-03 MED ORDER — ondansetron (ZOFRAN) tablet 4 mg
4 | Freq: Four times a day (QID) | ORAL | Status: AC | PRN
Start: 2020-10-03 — End: 2020-10-03

## 2020-10-03 MED ORDER — multivitamin with folic acid tablet 1 tablet
400 | Freq: Every day | ORAL | Status: AC
Start: 2020-10-03 — End: 2020-10-04
  Administered 2020-10-03 – 2020-10-04 (×2): 1 via ORAL

## 2020-10-03 MED ORDER — traZODone (DESYREL) half tablet 25 mg
50 | Freq: Every evening | ORAL | Status: AC | PRN
Start: 2020-10-03 — End: 2020-10-04

## 2020-10-03 MED ORDER — methocarbamoL (ROBAXIN) tablet 500 mg
500 | Freq: Four times a day (QID) | ORAL | Status: AC
Start: 2020-10-03 — End: 2020-10-04
  Administered 2020-10-03 – 2020-10-04 (×6): 500 mg via ORAL

## 2020-10-03 MED ORDER — ibuprofen (MOTRIN) tablet 400 mg
400 | ORAL | Status: AC | PRN
Start: 2020-10-03 — End: 2020-10-04

## 2020-10-03 MED ORDER — LORazepam (ATIVAN) injection 2 mg
2 | INTRAMUSCULAR | Status: AC | PRN
Start: 2020-10-03 — End: 2020-10-04
  Administered 2020-10-03 – 2020-10-04 (×8): 2 mg via INTRAVENOUS

## 2020-10-03 MED ORDER — sodium chloride flush 10 mL
INTRAMUSCULAR | Status: AC
Start: 2020-10-03 — End: 2020-10-04
  Administered 2020-10-03 – 2020-10-04 (×4): 10 mL via INTRAVENOUS

## 2020-10-03 MED ORDER — dicyclomine (BENTYL) capsule 10 mg
10 | Freq: Four times a day (QID) | ORAL | Status: AC
Start: 2020-10-03 — End: 2020-10-04
  Administered 2020-10-03 – 2020-10-04 (×6): 10 mg via ORAL

## 2020-10-03 MED ORDER — LORazepam (ATIVAN) injection 4 mg
2 | INTRAMUSCULAR | Status: AC | PRN
Start: 2020-10-03 — End: 2020-10-04
  Administered 2020-10-04: 17:00:00 4 mg via INTRAVENOUS

## 2020-10-03 MED ORDER — heparin (porcine) injection 5,000 Units
5000 | Freq: Three times a day (TID) | INTRAMUSCULAR | Status: AC
Start: 2020-10-03 — End: 2020-10-04
  Administered 2020-10-03 (×2): 5000 [IU] via SUBCUTANEOUS

## 2020-10-03 MED ORDER — ondansetron (ZOFRAN) injection 4 mg
4 | Freq: Three times a day (TID) | INTRAMUSCULAR | Status: AC | PRN
Start: 2020-10-03 — End: 2020-10-03
  Administered 2020-10-03: 13:00:00 4 mg via INTRAVENOUS

## 2020-10-03 MED ORDER — ondansetron (ZOFRAN) injection 4 mg
4 | Freq: Four times a day (QID) | INTRAMUSCULAR | Status: AC | PRN
Start: 2020-10-03 — End: 2020-10-04
  Administered 2020-10-04: 14:00:00 4 mg via INTRAVENOUS

## 2020-10-03 MED ORDER — buprenorphine HCL (SUBUTEX) SL tablet 4 mg
2 | Freq: Once | SUBLINGUAL | Status: AC | PRN
Start: 2020-10-03 — End: 2020-10-04

## 2020-10-03 MED ORDER — magnesium hydroxide (MILK OF MAGNESIA) 2,400 mg/10 mL oral suspension 10 mL
2400 | Freq: Every day | ORAL | Status: AC | PRN
Start: 2020-10-03 — End: 2020-10-04
  Administered 2020-10-04: 05:00:00 10 mL via ORAL

## 2020-10-03 MED ORDER — diazePAM (VALIUM) Syrg 10 mg
5 | Freq: Three times a day (TID) | INTRAMUSCULAR | Status: AC
Start: 2020-10-03 — End: 2020-10-04
  Administered 2020-10-03 – 2020-10-04 (×4): 10 mg via INTRAVENOUS

## 2020-10-03 MED ORDER — carvediloL (COREG) tablet 6.25 mg
6.25 | Freq: Two times a day (BID) | ORAL | Status: AC
Start: 2020-10-03 — End: 2020-10-04
  Administered 2020-10-03 – 2020-10-04 (×3): 6.25 mg via ORAL

## 2020-10-03 MED ORDER — abacavir-dolutegravir-lamivud (TRIUMEQ) 600-50-300 mg tablet 1 tablet
600-50-300 | Freq: Every day | ORAL | Status: AC
Start: 2020-10-03 — End: 2020-10-04
  Administered 2020-10-03 – 2020-10-04 (×2): 1 via ORAL

## 2020-10-03 MED ORDER — cloNIDine HCL (CATAPRES) tablet 0.1 mg
0.1 | Freq: Two times a day (BID) | ORAL | Status: AC | PRN
Start: 2020-10-03 — End: 2020-10-03

## 2020-10-03 MED ORDER — sodium chloride 0.9 % infusion
INTRAVENOUS | Status: AC
Start: 2020-10-03 — End: 2020-10-04
  Administered 2020-10-03 (×2): 100 mL/h via INTRAVENOUS

## 2020-10-03 MED ORDER — loperamide (IMODIUM) capsule 2 mg
2 | ORAL | Status: AC | PRN
Start: 2020-10-03 — End: 2020-10-04
  Administered 2020-10-03 – 2020-10-04 (×3): 2 mg via ORAL

## 2020-10-03 MED ORDER — metoprolol tartrate (LOPRESSOR) tablet 25 mg
25 | Freq: Two times a day (BID) | ORAL | Status: AC
Start: 2020-10-03 — End: 2020-10-03

## 2020-10-03 MED ORDER — folic acid (FOLVITE) tablet 1 mg
1 | Freq: Every day | ORAL | Status: AC
Start: 2020-10-03 — End: 2020-10-04
  Administered 2020-10-03 – 2020-10-04 (×2): 1 mg via ORAL

## 2020-10-03 MED ORDER — diazePAM (VALIUM) Syrg 10 mg
5 | Freq: Once | INTRAMUSCULAR | Status: AC
Start: 2020-10-03 — End: 2020-10-03
  Administered 2020-10-03: 06:00:00 10 mg via INTRAVENOUS

## 2020-10-03 MED ORDER — cloNIDine HCL (CATAPRES) tablet 0.1 mg
0.1 | Freq: Two times a day (BID) | ORAL | Status: AC | PRN
Start: 2020-10-03 — End: 2020-10-04
  Administered 2020-10-03 – 2020-10-04 (×3): 0.1 mg via ORAL

## 2020-10-03 MED ORDER — thiamine mononitrate (VITAMIN B-1) tablet 100 mg
100 | Freq: Every day | ORAL | Status: AC
Start: 2020-10-03 — End: 2020-10-04
  Administered 2020-10-03 – 2020-10-04 (×2): 100 mg via ORAL

## 2020-10-03 MED FILL — CLONIDINE HCL 0.1 MG TABLET: 0.1 0.1 MG | ORAL | Qty: 1

## 2020-10-03 MED FILL — ONDANSETRON HCL (PF) 4 MG/2 ML INJECTION SOLUTION: 4 4 mg/2 mL | INTRAMUSCULAR | Qty: 2

## 2020-10-03 MED FILL — DICYCLOMINE 10 MG CAPSULE: 10 10 MG | ORAL | Qty: 1

## 2020-10-03 MED FILL — LORAZEPAM 2 MG/ML INJECTION SOLUTION: 2 2 mg/mL | INTRAMUSCULAR | Qty: 1

## 2020-10-03 MED FILL — BUPRENORPHINE HCL 2 MG SUBLINGUAL TABLET: 2 2 mg | SUBLINGUAL | Qty: 2

## 2020-10-03 MED FILL — DIAZEPAM 5 MG TABLET: 5 5 MG | ORAL | Qty: 2

## 2020-10-03 MED FILL — TYLENOL 325 MG TABLET: 325 325 mg | ORAL | Qty: 2

## 2020-10-03 MED FILL — METHOCARBAMOL 500 MG TABLET: 500 500 MG | ORAL | Qty: 1

## 2020-10-03 MED FILL — DIAZEPAM 5 MG/ML INJECTION SYRINGE: 5 5 mg/mL | INTRAMUSCULAR | Qty: 2

## 2020-10-03 MED FILL — VITAMIN B-1 (MONONITRATE) 100 MG TABLET: 100 100 mg | ORAL | Qty: 1

## 2020-10-03 MED FILL — THERA 400 MCG TABLET: 400 400 mcg | ORAL | Qty: 1

## 2020-10-03 MED FILL — FOLIC ACID 1 MG TABLET: 1 1 MG | ORAL | Qty: 1

## 2020-10-03 MED FILL — HEPARIN (PORCINE) 5,000 UNIT/ML INJECTION SOLUTION: 5000 5,000 unit/mL | INTRAMUSCULAR | Qty: 1

## 2020-10-03 MED FILL — LOPERAMIDE 2 MG CAPSULE: 2 2 mg | ORAL | Qty: 1

## 2020-10-03 MED FILL — TRIUMEQ 600 MG-50 MG-300 MG TABLET: 600-50-300 600-50-300 mg | ORAL | Qty: 1

## 2020-10-03 MED FILL — CARVEDILOL 6.25 MG TABLET: 6.25 6.25 MG | ORAL | Qty: 1

## 2020-10-03 NOTE — Unmapped (Addendum)
Pt complains of headache, nausea w/ vomiting, tremors and tingling in toes. Pt currently seems restless, fidgeting in bed with cords and sheets.

## 2020-10-03 NOTE — Unmapped (Signed)
Transportation request submitted to [facility] for patient's medical admission at [phone].      Kirstie Mirza, LISW  Emergency Department Social Worker   [phone]

## 2020-10-03 NOTE — Unmapped (Signed)
Please refer to Dr. Christiana Pellant H&P for full details.      Patient clearly uncomfortably on interview this morning. He is diaphoretic, tremulous. CIWA scores are running between 7-11.     Exam:  Diaphoretic, tremulous, anxious  CV: RRR, no murmurs  Resp: breathing unlabored  Abd: soft, nondistended  Skin - no rashes    Assessment/Plan:  # Polysubstance abuse with alcohol and opioid withdrawal - continue COWS and CIWA, suboxone as needed for opioid withdrawal sx's. Continue scheduled valium and symptom-triggered ativan for ethanol withdrawal. Patient is interested in seeing addiction medicine while hospitalized.     # HIV - continue triumeq.     # Chronic hepatitis C - not a candidate for therapy given active use.    # Elevated liver enzymes - AST:ALT ratio 2:1 consistent with an alcoholic pattern. Will check PT/INR to determine MDF.     Garth Schlatter, DO  Internal Medicine, Attending Physician

## 2020-10-03 NOTE — Unmapped (Signed)
Problem: Acute Pain  Description: Patient's pain progressing toward patient's stated pain goal  Goal: Patient displays improved well-being such as baseline levels for pulse, BP, respirations and relaxed muscle tone or body posture  Outcome: Progressing     Problem: Fall Prevention  Goal: Patient will remain free of falls  Description: Assess and monitor vitals signs, neurological status including level of consciousness and orientation.  Reassess fall risk per hospital policy.    Ensure arm band on, uncluttered walking paths in room, adequate room lighting, call light and overbed table within reach, bed in low position, wheels locked, side rails up per policy (excluding SNF), and non-skid footwear provided.   Outcome: Progressing

## 2020-10-03 NOTE — Unmapped (Addendum)
You can call the [phone] to discuss housing options given your current homeless situation.  If you would like to get into medication assisted treatment for alcohol and fentanyl, you can return to the emergency department during business hours to discuss this with our counselors.

## 2020-10-03 NOTE — Unmapped (Signed)
Ready and clean bed assigned to 4178. Pt updated on plan of care including transfer and is agreeable. Receiving RN may call [phone] to consult ED RN with questions regarding patients care. Pt is leaving the department in stable condition with all personal items in possession.   Ordered medications that have been received from Pharmacy will not be tubed to receiving unit.  The patient does not have a patient monitor at bedside in the ED.  Pt was swabbed for COVID 19 and is considered Low risk.   Most recent vitals: BP 150/88 (BP Location: Right arm, Patient Position: Sitting)    Pulse 88    Temp 98.4 ??F (36.9 ??C) (Oral)    Resp 18    SpO2 98%     CODY HURST ,RN

## 2020-10-03 NOTE — Unmapped (Signed)
Problem: Acute Pain  Description: Patient's pain progressing toward patient's stated pain goal  Goal: Patient will manage pain with the appropriate technique/intervention  Description: Assess and monitor patient's pain using appropriate pain scale. Collaborate with interdisciplinary team and initiate plan and interventions as ordered.  Re-assess patient's pain level 30-60 minutes after pain management intervention.  Outcome: Progressing     Problem: Fall Prevention  Goal: Patient will remain free of falls  Description: Assess and monitor vitals signs, neurological status including level of consciousness and orientation.  Reassess fall risk per hospital policy.    Ensure arm band on, uncluttered walking paths in room, adequate room lighting, call light and overbed table within reach, bed in low position, wheels locked, side rails up per policy (excluding SNF), and non-skid footwear provided.   Outcome: Progressing

## 2020-10-03 NOTE — Unmapped (Signed)
HOSPITAL MEDICINE  HISTORY & PHYSICAL    Name: Alexander [name]  MRN: [id number]  CSN: [id number]    Chief complaint:  Feels like he is withdrawing    History of Present Illness     Alexander [name] is an 34 y.o. male with past medical history of IV drug use, HIV, alcohol dependence, homelessness who presented to the ED with complaints that he feels like he is withdrawing.  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  He also reports that he uses about 2 g of fentanyl and cocaine daily.    Patient reports having numbness in his fingers and toes, palpitations, sweating, generalized cramps.  While in the ED he received some Valium to begin his alcohol withdrawal protocol.  His blood pressure is elevated, he appears diaphoretic, however rest of vitals stable.  Labs show elevated AST of 283, ALT of 117, UA is normal, troponins are negative, UDS is positive for cocaine and fentanyl, influenza A/B and negative, SARS Covid test is negative and chest x-ray is unremarkable.  Patient states that he lost his apartment about a week ago and is currently homeless.    Review of Systems       Constitutional: He does complain of significant weight loss, but no fever, chills   HEENT: no neck swelling, odynophagia or eye discharge or blurry vision  CVS: No chest pain, he does have palpitations, exertional shortness of breath, leg or ankle swelling, PND or orthopnea  Respiratory: cough, shortness of breath, wheezing  Gastrointestinal: No abdominal pain, nausea or vomiting, no diarrhea or constipation  GU: denies any dsyuria, nocturia, frequency, urgency  CNS: denies any headaches, dizziness, syncope, no focal weakness  Skin: No rashes or discoloration  Endocrine: denies any polyuria, polydypsia, weight loss, weight gain  Musculoskeletal: No joint pains, he does complain of generalized cramps and myalgia        Past Medical & Surgical History      Past Medical History:   Diagnosis Date   ??? Anxiety    ??? Bipolar disorder (CMS Dx)    ??? Bronchitis    ??? Depression    ??? Drug abuse and dependence (CMS Dx)    ??? H/O tooth extraction    ??? Hepatitis C    ??? HIV infection (CMS Dx)    ??? Liver disease Hep C   ??? PTSD (post-traumatic stress disorder)    ??? Substance induced mood disorder (CMS Dx)    ??? Thyroid disease        Past Surgical History:   Procedure Laterality Date   ??? MANDIBLE SURGERY           Family History     Family History   Problem Relation Age of Onset   ??? Drug abuse Brother    ??? Suicidality Brother    ??? Hypertension Mother    ??? Transient ischemic attack Mother    ??? Seizures Father    ??? Hypertension Father    ??? Cancer Father    ??? Breast Cancer Father    ??? Suicidality Father    ??? Drug abuse Brother    ??? Suicidality Brother          Social History     Social History     Tobacco Use   ??? Smoking status: Current Every Day Smoker     Packs/day: 1.00  Years: 11.00     Pack years: 11.00     Types: Cigarettes   ??? Smokeless tobacco: Never Used   Substance Use Topics   ??? Alcohol use: Yes     Comment: last drink this AM, beer   ??? Drug use: Yes     Types: Heroin, Other-see comments, Marijuana, Cocaine     Comment: fentanyl last night          Medications     Medications Prior to Admission   Medication Sig Dispense Refill Last Dose   ??? metoprolol tartrate (LOPRESSOR) 25 MG tablet Take 25 mg by mouth 2 times a day.             ??? naloxone (NARCAN) 4 mg/actuation Spry Apply 1 spray in one nostril if needed. Call 911. May repeat dose in other nostril if no response in 3 minutes. 2 each 0    ??? TRIUMEQ 600-50-300 mg Tab TAKE ONE TABLET BY MOUTH DAILY 30 tablet 1           Vital Signs     Temp:  [98 ??F (36.7 ??C)-98.5 ??F (36.9 ??C)] 98 ??F (36.7 ??C)  Heart Rate:  [77-98] 92  Resp:  [16-20] 20  BP: (134-181)/(88-100) 181/100    Intake/Output Summary (Last 24 hours) at 10/03/2020 0526  Last data filed at 10/03/2020 0105  Gross per 24 hour   Intake 2641.67 ml   Output ???   Net  2641.67 ml         Physical Exam      Gen - alert, no distress, diaphoretic, was sleeping in bed, easily arousable    Eyes - normal conjunctiva, normal pupils    ENT - moist mucosa, no OP erythema or exudates    Neck - supple, no thyromegaly    Lymph - no adenopathy cervical or supraclavicular    CV - Regular, normal s1 s2,  no MRG, no elevated JVP, no edema    Lung - Clear to auscultation, no added sounds     Abd - +BS, soft nt/nd, no organomegaly    MSK - no clubbing, no cyanosis, no joint swelling, no muscle tenderness    Skin - normal temp, no rashes    Neuro - alert, oriented to self/place/time/situation, no facial assymmetry, no gross focal weakness, sensation grossly intact to light touch    Psych - normal mood, normal behavior      Laboratory Data         Lab 10/02/20  1939   WBC 4.9   HEMOGLOBIN 15.1   HEMATOCRIT 45.6   MEAN CORPUSCULAR VOLUME 84.5   PLATELETS 177           Lab 10/02/20  1939   SODIUM 133   POTASSIUM 4.2   CHLORIDE 97*   CO2 25   BUN 4*   CREATININE 0.64   GLUCOSE 100           Lab 10/02/20  1939   CALCIUM 9.3                 Lab 10/02/20  1939   ALT 117*   AST 283*   ALK PHOS 120   BILIRUBIN TOTAL 0.6   BILIRUBIN DIRECT 0.24   ALBUMIN 4.0           Lab 10/02/20  1939   COLOR, URINE Yellow   CLARITY Clear   SPECIFIC GRAVITY, URINE 1.020   PH UA 6.0   PROTEIN UA Negative  GLUCOSE UA Negative   KETONES UA Negative   BILIRUBIN UA Negative   BLOOD UA Negative   NITRITE UA Negative   UROBILINOGEN UA 0.2 E.U./dL   LEUKOCYTES UA Negative             No results found for: NTPROBNP    Lab Results   Component Value Date    TSH 1.21 10/17/2017    FREET4 1.08 11/14/2013                 Diagnostic Studies     X-ray Portable Chest   Final Result   IMPRESSION:    No acute cardiopulmonary abnormality.       Report Verified by: William Hamburger, MD at 10/02/2020 7:48 PM EDT            EKG     ECG:  EKG personally reviewed: Normal sinus rhythm    Assessment & Plan     Alexander [name] is a 34 y.o. male being  admitted to the hospital for alcohol withdrawal and opiate dependence      Active Problems:    * No active hospital problems. *    1.  Alcohol dependence with early signs of withdrawal: Patient reported tremulous in the ED, may be a combination of opiates alcohol withdrawal.  Will place on high-dose CIWA protocol, with tapering dose of Valium, and as needed Ativan.  Consult addiction medicine    2.  Opioid abuse/IVDU: UDS positive for fentanyl and cocaine: Begin on COWS protocol.Avoid opioid medications. Start Robaxin, Bentyl, clonidine    3.  HIV: Reports excessive weight loss, last CD4 count was 300. CD4 rechecked in ED and pending. Has missed his HIV clinic appointments. Continue Triumeq    4.  Chronic hep C: Has received vaccination for other Hepatitis variants. Not a candidate for treatment due to active use.    5. Alcoholic hepatitis: AS 283, ALT 117, give IVFs and continue to monitor.    Theresa Mulligan, MD MPH, FACP  Department of Internal Medicine  Pager ID [phone])   10/03/2020, 5:26 AM

## 2020-10-03 NOTE — Unmapped (Signed)
Pt with cows and ciwa scores of 7 and 9 respectively.  1x incident of diarrhea.  Subutex offered and refused.pt resting in bed at this time.

## 2020-10-03 NOTE — Unmapped (Signed)
Patient was admitted to room 4178 Patient alert and oriented x4. Patient states pain is 6 and abdomen is tender on palpation. Seizure and fall precaution in place. Bed in lowest position and call light within reach. Isolation precaution explain to patient.  Will continue to monitor patient.

## 2020-10-03 NOTE — Nursing Note (Signed)
Patient still refusing subutex

## 2020-10-03 NOTE — Unmapped (Addendum)
SW provided patient with dry clothing. [facility] ETA [phone].      Kirstie Mirza, LISW  Emergency Department Social Worker   [phone]

## 2020-10-04 DIAGNOSIS — F1910 Other psychoactive substance abuse, uncomplicated: Principal | ICD-10-CM

## 2020-10-04 LAB — PROTIME-INR
INR: 1.1 (ref 0.9–1.1)
Protime: 14.6 s (ref 12.1–15.1)

## 2020-10-04 LAB — HEPATIC FUNCTION PANEL
ALT: 84 U/L — ABNORMAL HIGH (ref 7–52)
AST: 152 U/L — ABNORMAL HIGH (ref 13–39)
Albumin: 3.2 g/dL — ABNORMAL LOW (ref 3.5–5.7)
Alkaline Phosphatase: 98 U/L (ref 36–125)
Bilirubin, Direct: 0.4 mg/dL (ref 0.00–0.40)
Bilirubin, Indirect: 0.6 mg/dL (ref 0.00–1.10)
Total Bilirubin: 1 mg/dL (ref 0.0–1.5)
Total Protein: 7.4 g/dL (ref 6.4–8.9)

## 2020-10-04 MED ORDER — LORazepam (ATIVAN) injection 2 mg
2 | Freq: Once | INTRAMUSCULAR | Status: AC
Start: 2020-10-04 — End: 2020-10-04
  Administered 2020-10-04: 05:00:00 2 mg via INTRAVENOUS

## 2020-10-04 MED ORDER — pantoprazole (PROTONIX) EC tablet 40 mg
40 | Freq: Every day | ORAL | Status: AC
Start: 2020-10-04 — End: 2020-10-04

## 2020-10-04 MED ORDER — diazePAM (VALIUM) Syrg 15 mg
5 | Freq: Three times a day (TID) | INTRAMUSCULAR | Status: AC
Start: 2020-10-04 — End: 2020-10-04

## 2020-10-04 MED ORDER — diazePAM (VALIUM) tablet 15 mg
5 | Freq: Three times a day (TID) | ORAL | Status: AC
Start: 2020-10-04 — End: 2020-10-04

## 2020-10-04 MED ORDER — aluminum & magnesium hydroxide-simethicone (MYLANTA) suspension 15 mL
400-400-40 | Freq: Four times a day (QID) | ORAL | Status: AC | PRN
Start: 2020-10-04 — End: 2020-10-04

## 2020-10-04 MED ORDER — calcium carbonate (TUMS) chewable tablet 500 mg
200 | Freq: Every day | ORAL | Status: AC | PRN
Start: 2020-10-04 — End: 2020-10-04
  Administered 2020-10-04: 05:00:00 500 mg via ORAL

## 2020-10-04 MED FILL — VITAMIN B-1 (MONONITRATE) 100 MG TABLET: 100 100 mg | ORAL | Qty: 1

## 2020-10-04 MED FILL — METHOCARBAMOL 500 MG TABLET: 500 500 MG | ORAL | Qty: 1

## 2020-10-04 MED FILL — LOPERAMIDE 2 MG CAPSULE: 2 2 mg | ORAL | Qty: 1

## 2020-10-04 MED FILL — LORAZEPAM 2 MG/ML INJECTION SOLUTION: 2 2 mg/mL | INTRAMUSCULAR | Qty: 2

## 2020-10-04 MED FILL — DICYCLOMINE 10 MG CAPSULE: 10 10 MG | ORAL | Qty: 1

## 2020-10-04 MED FILL — CALCIUM ANTACID 200 MG CALCIUM (500 MG) CHEWABLE TABLET: 200 200 mg calcium (500 mg) | ORAL | Qty: 1

## 2020-10-04 MED FILL — HEPARIN (PORCINE) 5,000 UNIT/ML INJECTION SOLUTION: 5000 5,000 unit/mL | INTRAMUSCULAR | Qty: 1

## 2020-10-04 MED FILL — LORAZEPAM 2 MG/ML INJECTION SOLUTION: 2 2 mg/mL | INTRAMUSCULAR | Qty: 1

## 2020-10-04 MED FILL — CLONIDINE HCL 0.1 MG TABLET: 0.1 0.1 MG | ORAL | Qty: 1

## 2020-10-04 MED FILL — CARVEDILOL 6.25 MG TABLET: 6.25 6.25 MG | ORAL | Qty: 1

## 2020-10-04 MED FILL — ONDANSETRON HCL (PF) 4 MG/2 ML INJECTION SOLUTION: 4 4 mg/2 mL | INTRAMUSCULAR | Qty: 2

## 2020-10-04 MED FILL — MILK OF MAGNESIA CONCENTRATED 2,400 MG/10 ML ORAL SUSPENSION: 2400 2,400 mg/10 mL | ORAL | Qty: 10

## 2020-10-04 MED FILL — FOLIC ACID 1 MG TABLET: 1 1 MG | ORAL | Qty: 1

## 2020-10-04 MED FILL — DIAZEPAM 5 MG TABLET: 5 5 MG | ORAL | Qty: 2

## 2020-10-04 MED FILL — THERA 400 MCG TABLET: 400 400 mcg | ORAL | Qty: 1

## 2020-10-04 MED FILL — DIAZEPAM 5 MG/ML INJECTION SYRINGE: 5 5 mg/mL | INTRAMUSCULAR | Qty: 2

## 2020-10-04 MED FILL — TRIUMEQ 600 MG-50 MG-300 MG TABLET: 600-50-300 600-50-300 mg | ORAL | Qty: 1

## 2020-10-04 NOTE — Unmapped (Signed)
Brief Addiction Services Cross-cover POC Note:    Acknowledge receipt of consult for Addiction Services. Discussed with primary team patient's interest in getting connected with resources/rehab. Will either see today on cross cover if able or be seen tomorrow by Addiction Services attending.     Rumor Sun DAVID CROSBIE-COCKROFT  FM/Psychiatry, PGY-3

## 2020-10-04 NOTE — Unmapped (Signed)
Patient threatening to leave AMA, MD aware & discussed risks of leaving. Patient noted walking down the hallway with all belonging. Writer asked to remove IV prior to departure, patient agreed and this RN removed IV access. Patient left AMA

## 2020-10-04 NOTE — Unmapped (Signed)
Nursing Overnight Progress Note    Significant Events During Shift  Patient had diarrhea 2x. High BP-prn clonidine given. COWS $ CIWA are 7-12 all night. No other changes during the shift.    Patient/Family Concerns  Evening/Overnight Visitors: none  Concerns: none    Assessment  Nursing time demands: moderate  IV access: has IV access, adequate and functioning  Sitter requirements:  no    Mental Status  Mental Status for the past 14 hrs:   Level of Consciousness Orientation Level Cognition   10/03/20 1638 Drowsy: falls to sleep frequently at inappropriate times/place Oriented to person Follows simple commands   10/03/20 2010 Alert Oriented X4 Follows 2-step commands       Calls to Physician Team  No data found.    Medications  [phone] - Medications Not Given  (last 12 hrs)         ** SITE UNKNOWN **       Medication Name Action Time Action Reason Comments     heparin (porcine) injection 5,000 Units 10/03/20 2014 Not Given Patient/family refused      heparin (porcine) injection 5,000 Units 10/04/20 0415 Not Given Patient/family refused      pantoprazole (PROTONIX) EC tablet 40 mg 10/04/20 0136 Not Given Other                 Diet/Meals Consumed (past 24 hours)  Nutrition Assessment for the past 24 hrs:   Percent Meals Eaten (%)   10/03/20 1227 50 %

## 2020-10-04 NOTE — Unmapped (Signed)
Hospital Medicine Progress Note  [place] // [facility]      Chief Complaint / Reason for Follow-Up     Alexander [name] is a 34 y.o. male on hospital day 0.  The principal reason for today's follow up visit is Polysubstance abuse (CMS Dx).      Interval History     CIWA's stable in the 7-11 range. Refused the suboxone. Given a total of 14mg  of IV ativan based on CIWA protocol. Has small amounts of diarrhea.     Review of Systems (focused)     + anxiety  + headache  + nausea  + diarrhea    Medications     Scheduled Meds:  ??? abacavir-dolutegravir-lamivud  1 tablet Oral Daily 0900   ??? carvediloL  6.25 mg Oral BID   ??? diazePAM  10 mg Oral TID    Or   ??? diazePAM  10 mg Intravenous TID   ??? dicyclomine  10 mg Oral QID   ??? folic acid  1 mg Oral Daily 0900    And   ??? multivitamin with folic acid  1 tablet Oral Daily 0900    And   ??? thiamine  100 mg Oral Daily 0900   ??? heparin  5,000 Units Subcutaneous 3 times per day   ??? methocarbamoL  500 mg Oral QID   ??? pantoprazole  40 mg Oral DAILY 0700   ??? sodium chloride  10 mL Intravenous QS     Continuous Infusions:  PRN Meds:acetaminophen, aluminum & magnesium hydroxide-simethicone, buprenorphine HCL, buprenorphine HCL, calcium carbonate, cloNIDine HCL, ibuprofen, loperamide, LORazepam **OR** LORazepam, magnesium hydroxide, naloxone, ondansetron, traZODone      Vital Signs     Temp:  [97 ??F (36.1 ??C)-98.9 ??F (37.2 ??C)] 97 ??F (36.1 ??C)  Heart Rate:  [53-75] 63  Resp:  [16-18] 18  BP: (129-186)/(82-118) 129/93    Intake/Output Summary (Last 24 hours) at 10/04/2020 0944  Last data filed at 10/03/2020 1852  Gross per 24 hour   Intake 1190.2 ml   Output ???   Net 1190.2 ml         Physical Exam     Gen - unkempt, appears uncomfortable in bed, slightly diaphoretic.  Eyes - normal conjunctiva, normal pupils  ENT - moist mucosa  CV - RRR, no MRG, no edema  Lung - CTA, normal WOB  Abd - soft nt/nd, no organomegaly  MSK - no clubbing, no cyanosis, no joint swelling, no muscle  tenderness  Skin - normal temp, no rashes  Neuro - alert, oriented to self/place/time/situation, no facial asymmetry, no gross focal weakness    Laboratory Data         Lab 10/02/20  1939   WBC 4.9   HEMOGLOBIN 15.1   HEMATOCRIT 45.6   MEAN CORPUSCULAR VOLUME 84.5   PLATELETS 177           Lab 10/03/20  1035 10/02/20  1939   SODIUM 138 133   POTASSIUM 4.4 4.2   CHLORIDE 98 97*   CO2 31 25   BUN 6* 4*   CREATININE 0.65 0.64   GLUCOSE 102* 100           Lab 10/03/20  1035 10/02/20  1939   CALCIUM 9.6 9.3           Lab 10/04/20  0325   INR 1.1   PROTHROMBIN TIME 14.6           Lab 10/04/20  1610 10/03/20  1035 10/02/20  1939   ALT 84* 110* 117*   AST 152* 266* 283*   ALK PHOS 98 114 120   BILIRUBIN TOTAL 1.0 1.0 0.6   BILIRUBIN DIRECT 0.40  --  0.24   ALBUMIN 3.2* 3.6 4.0           Lab 10/02/20  1939   COLOR, URINE Yellow   CLARITY Clear   SPECIFIC GRAVITY, URINE 1.020   PH UA 6.0   PROTEIN UA Negative   GLUCOSE UA Negative   KETONES UA Negative   BILIRUBIN UA Negative   BLOOD UA Negative   NITRITE UA Negative   UROBILINOGEN UA 0.2 E.U./dL   LEUKOCYTES UA Negative             No results found for: NTPROBNP    Lab Results   Component Value Date    TSH 1.21 10/17/2017    FREET4 1.08 11/14/2013                 Diagnostic Studies     No results found.    The images associated with the above reports were personally reviewed.      Assessment & Plan     Alexander [name] is a 34 y.o. male on hospital day 0.  The medical issues being addressed in today's encounter are as follows:    Principal Problem:    Polysubstance abuse (CMS Dx)  Active Problems:    Alcohol withdrawal (CMS Dx)      # Polysubstance abuse with alcohol and opioid withdrawal - continue COWS and CIWA, suboxone, clonidine as needed for opioid withdrawal sx's. Continue scheduled valium 10mg  TID and symptom-triggered ativan for ethanol withdrawal. Rquired 14mg  of IV ativan the past 24 hours. Addiction medicine consulted; to see patient today or tomorrow.   ??  # HIV  - continue triumeq.   ??  # Chronic hepatitis C - not a candidate for therapy given active use.  ??  # Elevated liver enzymes - AST:ALT ratio 2:1 consistent with an alcoholic pattern. Improving.   Diet:   Diet Orders          Diet regular starting at 10/30 0409        Code Status: Full Code            Kathlen Brunswick, DO  Section of Hospital Medicine  Department of Internal Medicine  Pager ID [phone]  9:44 AM, 10/04/2020

## 2020-10-04 NOTE — Unmapped (Signed)
[facility]  Department of Internal Medicine  Inpatient Discharge Summary [facility] DISCHARGE)    Patient: Alexander [name]   MRN: [id number]   CSN: [id number]    Date of Admission: 10/02/2020  Date of Discharge: 10/04/2020  Attending Physician: Kathlen Brunswick, DO    Past Medical History     Past Medical History:   Diagnosis Date   ??? Anxiety    ??? Bipolar disorder (CMS Dx)    ??? Bronchitis    ??? Depression    ??? Drug abuse and dependence (CMS Dx)    ??? H/O tooth extraction    ??? Hepatitis C    ??? HIV infection (CMS Dx)    ??? Liver disease Hep C   ??? PTSD (post-traumatic stress disorder)    ??? Substance induced mood disorder (CMS Dx)    ??? Thyroid disease           Discharge Diagnoses     Active Hospital Problems    Diagnosis Date Noted   ??? Polysubstance abuse (CMS Dx) [F19.10] 12/11/2013   ??? Alcohol withdrawal (CMS Dx) [G29.528] 02/01/2020      Resolved Hospital Problems   No resolved problems to display.         Operations/Procedures Performed (include dates)     Notable Imaging Studies:  CXR: No acute cardiopulmonary abnormality.       Consulting Services (include reason)     NONE    Allergies     No Known Allergies      Discharge Medications        Medication List      TAKE these medications, which you were ALREADY TAKING      Quantity/Refills   Triumeq 600-50-300 mg Tab  Generic drug: abacavir-dolutegravir-lamivud  Take 1 tablet by mouth daily.   Quantity: 30 tablet  Refills: 0        ASK your doctor about these medications      Quantity/Refills   metoprolol tartrate 25 MG tablet  Commonly known as: LOPRESSOR  Take 25 mg by mouth 2 times a day.    Refills: 0     naloxone 4 mg/actuation Spry  Commonly known as: NARCAN  Apply 1 spray in one nostril if needed. Call 911. May repeat dose in other nostril if no response in 3 minutes.   Quantity: 2 each  Refills: 0           Where to Get Your Medications      These medications were sent to [facility]  [address] [place], [place]  [address]    Hours: Monday - Friday: 8:00AM - 5:00PM Phone: [phone]   ?? Triumeq 600-50-300 mg Tab           Reason for Admission     Alexander [name] is a 34 y.o. male with PMH of polysubstance abuse (heroin/fentanyl, alcohol, cocaine) admitted for sx's of withdrawal.       Hospital Course By Problem     # Polysubstance abuse with alcohol and opioid withdrawal - the patient presented to the ED with complaints of withdrawal. He reports using 2 grams of fentanyl and cocaine daily, 26 cans of alcohol daily. He was admitted for symptom management and initiated on scheduled valium (10mg  TID) and symptom-triggered ativan via CIWA protocol. We also initiated COWS assessment for opioid withdrawal, and despite scoring high, the patient declined suboxone. He was also continued on zofran and clonidine for sx management. Over  the course of two days he was monitored, however on hospital day #2, the patient elected to leave AMA. I was not able to get back in time to assess the patient before he eloped on his own accord. On review of previous discharge summaries, the patient has left AMA on multiple occasions after being admitted for withdrawal, typically by hospital day 2.     # HIV - he was resumed on his HAART regimen.    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 Given HIV will need periodic monitoring for progression to malignancy.    Condition on Discharge     1. Functional Status: Normal    2. Mental Status: Abnormal - Description: agitation    3. Diet / Tube Feeding / TPN:  Diet Orders          None        As listed above    4. Respiratory / Lines & Tubes / Wounds:  None required    5. Discharge Physical Exam:    Vital Signs     Temp:  [97 ??F (36.1 ??C)-98 ??F (36.7 ??C)] 98 ??F (36.7 ??C)  Heart Rate:  [52-69] 52  Resp:  [18] 18  BP: (129-193)/(89-118) 193/108      Disposition     AMA discharge    Follow-Up Appointments     No future appointments.    Alexander Pun, MD  [address] [place]  [address]  [phone]    Schedule an appointment as soon as possible for a visit   As needed          Patient Instructions / Follow-Up Items for Receiving Physician     Signed:    Kathlen Brunswick, DO   Section of Hospital Medicine  Department of Internal Medicine  10/04/2020, 9:24 PM

## 2020-10-28 MED ORDER — TRIUMEQ 600-50-300 mg Tab
600-50-300 | ORAL_TABLET | ORAL | 0 refills | Status: AC
Start: 2020-10-28 — End: 2021-02-19

## 2021-01-24 ENCOUNTER — Emergency Department: Admit: 2021-01-25 | Payer: PRIVATE HEALTH INSURANCE

## 2021-01-24 DIAGNOSIS — S02651A Fracture of angle of right mandible, initial encounter for closed fracture: Principal | ICD-10-CM

## 2021-01-24 MED ORDER — diazePAM (VALIUM) Syrg 5 mg
5 | Freq: Once | INTRAMUSCULAR | Status: AC
Start: 2021-01-24 — End: 2021-01-24
  Administered 2021-01-25: 04:00:00 5 mg via INTRAVENOUS

## 2021-01-24 MED ORDER — electrolyte-R (pH 7.4) (NORMOSOL-R pH 7.4) iv solution SolP
Freq: Once | INTRAVENOUS | Status: AC
Start: 2021-01-24 — End: 2021-01-25
  Administered 2021-01-25: 04:00:00 1000 mL/h via INTRAVENOUS

## 2021-01-24 MED ORDER — ondansetron (ZOFRAN) 4 mg/2 mL injection
4 | INTRAMUSCULAR | Status: AC
Start: 2021-01-24 — End: ?

## 2021-01-24 MED ORDER — OMNIPAQUE (iohexol) 350 mg iodine/mL 150 mL
350 | Freq: Once | INTRAVENOUS | Status: AC | PRN
Start: 2021-01-24 — End: 2021-01-24
  Administered 2021-01-25: 04:00:00 100 mL via INTRAVENOUS

## 2021-01-24 MED ORDER — diazePAM (VALIUM) 5 mg/mL Syrg
5 | INTRAMUSCULAR | Status: AC
Start: 2021-01-24 — End: ?

## 2021-01-24 MED FILL — DIAZEPAM 5 MG/ML INJECTION SYRINGE: 5 5 mg/mL | INTRAMUSCULAR | Qty: 2

## 2021-01-24 NOTE — Unmapped (Signed)
Pt is daily drinker and user. Last fentanyl use was 24 hours ago. Last drink was 6 hours ago.
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
## 2021-01-24 NOTE — Unmapped (Signed)
Pt transported to ER CT on portable monitor with RN at bedside.
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
## 2021-01-25 ENCOUNTER — Inpatient Hospital Stay
Admission: EM | Admit: 2021-01-25 | Discharge: 2021-01-27 | Discharge status: Against Medical Advice | Payer: PRIVATE HEALTH INSURANCE

## 2021-01-25 LAB — URINE DRUG SCREEN WITHOUT CONFIRMATION, STAT
Amphetamine, 500 ng/mL Cutoff: NEGATIVE
Barbiturates UR, 300  ng/mL Cutoff: NEGATIVE
Benzodiazepines UR, 300 ng/mL Cutoff: NEGATIVE
Buprenorphine, 5 ng/mL Cutoff: NEGATIVE
Cocaine UR, 300 ng/mL Cutoff: POSITIVE — AB
Fentanyl, 2 ng/mL Cutoff: POSITIVE — AB
Methadone, UR, 300 ng/mL Cutoff: NEGATIVE
Opiates UR, 300 ng/mL Cutoff: NEGATIVE
Oxycodone, 100 ng/mL Cutoff: NEGATIVE
THC UR, 50 ng/mL Cutoff: NEGATIVE
Tricyclic Antidepressants, 300 ng/mL Cutoff: POSITIVE — AB

## 2021-01-25 LAB — CBC
Hematocrit: 51.6 % (ref 38.5–50.0)
Hemoglobin: 17.1 g/dL (ref 13.2–17.1)
MCH: 28.9 pg (ref 27.0–33.0)
MCHC: 33.2 g/dL (ref 32.0–36.0)
MCV: 87.1 fL (ref 80.0–100.0)
MPV: 8.4 fL (ref 7.5–11.5)
Platelets: 166 10*3/uL (ref 140–400)
RBC: 5.92 10*6/uL (ref 4.20–5.80)
RDW: 14.2 % (ref 11.0–15.0)
WBC: 9.3 10*3/uL (ref 3.8–10.8)

## 2021-01-25 LAB — ANTIBODY SCREEN: Antibody Screen: NEGATIVE

## 2021-01-25 LAB — DIFFERENTIAL
Basophils Absolute: 56 /uL (ref 0–200)
Basophils Relative: 0.6 % (ref 0.0–1.0)
Eosinophils Absolute: 65 /uL (ref 15–500)
Eosinophils Relative: 0.7 % (ref 0.0–8.0)
Lymphocytes Absolute: 4259 /uL (ref 850–3900)
Lymphocytes Relative: 45.8 % (ref 15.0–45.0)
Monocytes Absolute: 1070 /uL (ref 200–950)
Monocytes Relative: 11.5 % (ref 0.0–12.0)
Neutrophils Absolute: 3850 /uL (ref 1500–7800)
Neutrophils Relative: 41.4 % (ref 40.0–80.0)
nRBC: 0 /100 WBC (ref 0–0)

## 2021-01-25 LAB — ETHANOL, SERUM: Ethanol: 398 mg/dL (ref 0–10)

## 2021-01-25 LAB — BASIC METABOLIC PANEL
Anion Gap: 14 mmol/L (ref 3–16)
BUN: 4 mg/dL — ABNORMAL LOW (ref 7–25)
CO2: 25 mmol/L (ref 21–33)
Calcium: 10.1 mg/dL (ref 8.6–10.3)
Chloride: 94 mmol/L — ABNORMAL LOW (ref 98–110)
Creatinine: 0.69 mg/dL (ref 0.60–1.30)
Glucose: 117 mg/dL — ABNORMAL HIGH (ref 70–100)
Osmolality, Calculated: 274 mosm/kg — ABNORMAL LOW (ref 278–305)
Potassium: 4 mmol/L (ref 3.5–5.3)
Sodium: 133 mmol/L (ref 133–146)
eGFR AA CKD-EPI: >90 See note.
eGFR NONAA CKD-EPI: >90 See note.

## 2021-01-25 LAB — HEPATIC FUNCTION PANEL
ALT: 98 U/L — ABNORMAL HIGH (ref 7–52)
AST: 261 U/L — ABNORMAL HIGH (ref 13–39)
Albumin: 4.3 g/dL (ref 3.5–5.7)
Alkaline Phosphatase: 140 U/L — ABNORMAL HIGH (ref 36–125)
Bilirubin, Direct: 0.2 mg/dL (ref 0.0–0.4)
Bilirubin, Indirect: 0.6 mg/dL (ref 0.0–1.1)
Total Bilirubin: 0.8 mg/dL (ref 0.0–1.5)
Total Protein: 9.6 g/dL — ABNORMAL HIGH (ref 6.4–8.9)

## 2021-01-25 LAB — HIGH SENSITIVITY TROPONIN
High Sensitivity Troponin: 9 ng/L (ref 0–20)
High Sensitivity Troponin: 8 ng/L (ref 0–20)

## 2021-01-25 LAB — 2019 NOVEL CORONAVIRUS (COVID-19), NAA-B: SARS-CoV-2: NOT DETECTED

## 2021-01-25 LAB — ABO/RH: Rh Type: POSITIVE

## 2021-01-25 LAB — SALICYLATE LEVEL: Salicylate Lvl: <3 mg/dL (ref 10–30)

## 2021-01-25 LAB — PHOSPHORUS: Phosphorus: 4.6 mg/dL (ref 2.1–4.7)

## 2021-01-25 LAB — LIPASE: Lipase: 57 U/L (ref 4–82)

## 2021-01-25 LAB — MAGNESIUM: Magnesium: 2.1 mg/dL (ref 1.5–2.5)

## 2021-01-25 LAB — B NATRIURETIC PEPTIDE: BNP: 204 pg/mL (ref 0–100)

## 2021-01-25 LAB — POTASSIUM: Potassium: 3.8 mmol/L (ref 3.5–5.3)

## 2021-01-25 LAB — ACETAMINOPHEN LEVEL: Acetaminophen Level: <10 ug/mL (ref 10–30)

## 2021-01-25 MED ORDER — multivitamin with folic acid tablet 1 tablet
400 | Freq: Every day | ORAL | Status: AC
Start: 2021-01-25 — End: 2021-01-27
  Administered 2021-01-25 – 2021-01-26 (×2): 1 via ORAL

## 2021-01-25 MED ORDER — oxyCODONE (ROXICODONE) immediate release tablet 15 mg
5 | ORAL | Status: AC | PRN
Start: 2021-01-25 — End: 2021-01-27
  Administered 2021-01-26: 21:00:00 15 mg via ORAL

## 2021-01-25 MED ORDER — LORazepam (ATIVAN) injection 2 mg
2 | Freq: Once | INTRAMUSCULAR | Status: AC
Start: 2021-01-25 — End: 2021-01-25
  Administered 2021-01-25: 10:00:00 2 mg via INTRAVENOUS

## 2021-01-25 MED ORDER — acetaminophen (TYLENOL) oral solution Soln 650 mg
650 | ORAL | Status: AC | PRN
Start: 2021-01-25 — End: 2021-01-26
  Administered 2021-01-26 (×2): 650 mg via ORAL

## 2021-01-25 MED ORDER — folic acid (FOLVITE) tablet 1 mg
1 | Freq: Every day | ORAL | Status: AC
Start: 2021-01-25 — End: 2021-01-27
  Administered 2021-01-25 – 2021-01-26 (×2): 1 mg via ORAL

## 2021-01-25 MED ORDER — electrolyte-R (pH 7.4) (NORMOSOL-R pH 7.4) iv solution SolP
Freq: Once | INTRAVENOUS | Status: AC
Start: 2021-01-25 — End: 2021-01-25
  Administered 2021-01-25: 08:00:00 1000 mL/h via INTRAVENOUS

## 2021-01-25 MED ORDER — electrolyte-R (pH 7.4) (NORMOSOL-R pH 7.4) iv solution SolP
INTRAVENOUS | Status: AC
Start: 2021-01-25 — End: 2021-01-27
  Administered 2021-01-25 – 2021-01-27 (×5): 110 mL/h via INTRAVENOUS

## 2021-01-25 MED ORDER — diazePAMVALIUMtablet4mg
2 | Freq: Four times a day (QID) | ORAL | Status: AC
Start: 2021-01-25 — End: 2021-01-26
  Administered 2021-01-25 – 2021-01-26 (×5): 4 mg via ORAL

## 2021-01-25 MED ORDER — bacitracin zinc-polymyxin B (POLYSPORIN) topical ointment
500-10000 | Freq: Two times a day (BID) | TOPICAL | Status: AC
Start: 2021-01-25 — End: 2021-01-27
  Administered 2021-01-25 – 2021-01-27 (×2): via TOPICAL

## 2021-01-25 MED ORDER — oxyCODONE (ROXICODONE) immediate release tablet 5 mg
5 | ORAL | Status: AC | PRN
Start: 2021-01-25 — End: 2021-01-27
  Administered 2021-01-26: 09:00:00 5 mg via ORAL

## 2021-01-25 MED ORDER — heparin (porcine) injection 5,000 Units
5000 | Freq: Three times a day (TID) | INTRAMUSCULAR | Status: AC
Start: 2021-01-25 — End: 2021-01-27
  Administered 2021-01-25 – 2021-01-27 (×5): 5000 [IU] via SUBCUTANEOUS

## 2021-01-25 MED ORDER — ondansetron (ZOFRAN) injection 4 mg
4 | Freq: Four times a day (QID) | INTRAMUSCULAR | Status: AC | PRN
Start: 2021-01-25 — End: 2021-01-27
  Administered 2021-01-26 (×3): 4 mg via INTRAVENOUS

## 2021-01-25 MED ORDER — oxyCODONE (ROXICODONE) immediate release tablet 10 mg
5 | ORAL | Status: AC | PRN
Start: 2021-01-25 — End: 2021-01-27
  Administered 2021-01-25 – 2021-01-27 (×6): 10 mg via ORAL

## 2021-01-25 MED ORDER — LORazepam (ATIVAN) injection 4 mg
2 | INTRAMUSCULAR | Status: AC | PRN
Start: 2021-01-25 — End: 2021-01-27
  Administered 2021-01-25 – 2021-01-27 (×14): 4 mg via INTRAVENOUS

## 2021-01-25 MED ORDER — thiamine mononitrate (VITAMIN B-1) tablet 100 mg
100 | Freq: Every day | ORAL | Status: AC
Start: 2021-01-25 — End: 2021-01-27
  Administered 2021-01-25 – 2021-01-26 (×2): 100 mg via ORAL

## 2021-01-25 MED ORDER — LORazepam (ATIVAN) injection 2 mg
2 | INTRAMUSCULAR | Status: AC | PRN
Start: 2021-01-25 — End: 2021-01-27
  Administered 2021-01-25 – 2021-01-27 (×11): 2 mg via INTRAVENOUS

## 2021-01-25 MED FILL — OXYCODONE 5 MG TABLET: 5 5 MG | ORAL | Qty: 2

## 2021-01-25 MED FILL — LORAZEPAM 2 MG/ML INJECTION SOLUTION: 2 2 mg/mL | INTRAMUSCULAR | Qty: 1

## 2021-01-25 MED FILL — DIAZEPAM 2 MG TABLET: 2 2 MG | ORAL | Qty: 2

## 2021-01-25 MED FILL — LORAZEPAM 2 MG/ML INJECTION SOLUTION: 2 2 mg/mL | INTRAMUSCULAR | Qty: 2

## 2021-01-25 MED FILL — POLYSPORIN 500 UNIT-10,000 UNIT/GRAM TOPICAL OINTMENT: 500-10000 500-10,000 unit/gram | TOPICAL | Qty: 14.2

## 2021-01-25 MED FILL — THERA 400 MCG TABLET: 400 400 mcg | ORAL | Qty: 1

## 2021-01-25 MED FILL — HEPARIN (PORCINE) 5,000 UNIT/ML INJECTION SOLUTION: 5000 5,000 unit/mL | INTRAMUSCULAR | Qty: 1

## 2021-01-25 MED FILL — FOLIC ACID 1 MG TABLET: 1 1 MG | ORAL | Qty: 1

## 2021-01-25 MED FILL — VITAMIN B-1 (MONONITRATE) 100 MG TABLET: 100 100 mg | ORAL | Qty: 1

## 2021-01-25 MED FILL — ONDANSETRON HCL (PF) 4 MG/2 ML INJECTION SOLUTION: 4 4 mg/2 mL | INTRAMUSCULAR | Qty: 4

## 2021-01-25 MED FILL — OMNIPAQUE 350 MG IODINE/ML INTRAVENOUS SOLUTION: 350 350 mg iodine/mL | INTRAVENOUS | Qty: 150

## 2021-01-25 NOTE — Unmapped (Signed)
ENT at pt bedside

## 2021-01-25 NOTE — Unmapped (Signed)
ENT at bedside

## 2021-01-25 NOTE — Unmapped (Signed)
Bed: A04U  Expected date:   Expected time:   Means of arrival:   Comments:  SRU

## 2021-01-25 NOTE — Unmapped (Signed)
Addiction Consult - Plan of Care    Attempted to see patient x2 today. Pt sleeping or somnolent at each assessment and unable to complete interview at this time.     Pt well known to this physician from prior admission/consults, but has been several months since I had last seen him in the hospital. Pt struggles with alcohol and opioid use. Was typically drinking at least 18 standard drinks per day when last seen and per chart is reporting increase in alcohol use due to pain related to injury. Has history of Sz related to alcohol withdrawal. At that time was using about 1g/day of IV heroin/fentanyl. He has been on methadone in the past, but was most recently interested in buprenorphine treatment.     Alcohol use disorder, opioid use disorder - Recommend focusing on management of alcohol withdrawal at this time.  Pt currently on opioids for pain control and will need to discuss if he has plans for ongoing MOUD. Has typically stated that he needs to wait at least 72 hours from last fentanyl use to utilize buprenorphine. This combined with his being on opioids, would make buprenorphine a non-viable option for opioid withdrawal management. Would not recommend methadone, as this would be problematic if pt wants to go to residential or SNF and may lead to overdose in this period where requiring benzos for alcohol withdrawal and opioids for pain.   -Agree with CIWA dosed benzodiazepines.   --Could consider loading doses of benzodiazepine if pt should become more alert and appear to be struggling with alcohol withdrawal symptoms.   -Will attempt to assess patient again tomorrow.     Chrys Racer, MD

## 2021-01-25 NOTE — Unmapped (Signed)
[facility] approached patient for naloxone education and counseling. Patient consented to receiving take home Narcan. Medical team notified to provide take home Narcan to patient.       Joellyn Rued, PRS   Peer Recovery Specialist   Early Intervention Program (EIP)   [facility] Department of Emergency Medicine  Phone [phone]

## 2021-01-25 NOTE — Unmapped (Signed)
Patient placed in hospital bed for comfort

## 2021-01-25 NOTE — Unmapped (Signed)
[facility]    Trauma / Critically Ill Assessment      BOGDAN VIVONA  [id number]    Reason for Referral / Presenting Problem:   Reason for referral: Other (make comment)        Family Contact and Involvement:  Name/phone #/relationship of family contacted : Lynann Beaver [phone]  Family Met with Physician: No  Demographic Information Verified: No            Assessment and Social Work Interventions: 35 y/o single white male comes into the ER tonight due to jaw pain and withdrawal symptoms from alcohol. His friend, Ophelia Shoulder in the lobby asked to speak with Child psychotherapist. Met with her in conference room. She was the one who brought him to the ER. She met him through the [facility] where she is involved in an outreach program that helps homeless and those that are abusing drugs and alcohol. She states patient lives in a tent [place] the [employer] [address] [place] [address] and her church checks on him periodically. She states he has been talking recently how he is ready to go through detox and get some help. She reports he abuses alcohol and uses heroin and crack which he used earlier today and heroin prior to coming to the hospital. She would like to help him get into detox if possible. He has a mother and sister that live in [place] and would take him in if he was clean due to children living in the house hold. His father and brother both dies from substance abuse issues. She understands that patient has to want to go to detox and states he told her that is what he wanted tonight but she is aware he may change his mind. Explained that we will talk to him once he has sobered up a little and we know what his medical plan is going to be. Unable to get him into detox this time of night but she states he has talked with out SUDS team before and he would like to talk to them again about treatment. Explained that if patient does not get admitted and expresses he  would like to meet with the SUDS team to discuss treatment then we can refer him in the morning. She was very thankful and felt comfortable going home for the night. She states if patient were to get discharged she is more than willing to come pick him up and provided her phone number to call if needed. Provided support and active listening.      Safety Concerns: None noted.                                                                   Referral / Disposition Plan:  Patient moved to A pod to await admission to [facility]. Contacted Karleta to let her know. Social work may need to follow-up with patient if he is seeking drug rehab. No further needs at this time.
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
## 2021-01-25 NOTE — Unmapped (Signed)
EIP PRS approached pt to assess needs and offer support with HPA. Peer support was offered and accepted. Pt sates that he consumes alcohol daily, uses cocaine and injects fentanyl. He states that he is ready to seek treatment now as he is tired of living this way. He states that he has been to [facility], [place] and another treatment canter but he can not remember the name. SUD team made aware. PRS encouraged pt to reach out if any future needs should arise. PRS contact information left with pt.     Joellyn Rued, PRS   Peer Recovery Specialist   Early Intervention Program (EIP)   [facility] Department of Emergency Medicine  Phone [phone]

## 2021-01-25 NOTE — Unmapped (Signed)
Pt sleeping, easily arousable..  Denies needs at this time.

## 2021-01-25 NOTE — Unmapped (Signed)
EIP approached pt to access needs. Necessary resources and program education provided.
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
## 2021-01-25 NOTE — Unmapped (Signed)
[facility]  Case Management/Social Work Department  Discharge Planning Screen    Screening Questions     Do you need help filling out medical forms: No  Is patient from anywhere other than a private residence? (shelter, SNF, LTC, IPR, LTAC, etc.): No  Do you have any services that come into the house to help you? (COA, private duty, HHC, etc): No  Do you have barriers getting to follow up appointment or obtaining prescriptions?: No  Have you been to the (ED/hospital) 4x times in the past 6 months? : No    Jeremy Johann LSW,MSW   [phone]

## 2021-01-26 MED ORDER — acetaminophen (TYLENOL) oral solution Soln 650 mg
650 | ORAL | Status: AC
Start: 2021-01-26 — End: 2021-01-27
  Administered 2021-01-27 (×3): 650 mg via ORAL

## 2021-01-26 MED ORDER — diazePAM (VALIUM) tablet 8 mg
5 | Freq: Four times a day (QID) | ORAL | Status: AC
Start: 2021-01-26 — End: 2021-01-27
  Administered 2021-01-27 (×2): 8 mg via ORAL

## 2021-01-26 MED FILL — OXYCODONE 5 MG TABLET: 5 5 MG | ORAL | Qty: 2

## 2021-01-26 MED FILL — LORAZEPAM 2 MG/ML INJECTION SOLUTION: 2 2 mg/mL | INTRAMUSCULAR | Qty: 2

## 2021-01-26 MED FILL — LORAZEPAM 2 MG/ML INJECTION SOLUTION: 2 2 mg/mL | INTRAMUSCULAR | Qty: 1

## 2021-01-26 MED FILL — DIAZEPAM 2 MG TABLET: 2 2 MG | ORAL | Qty: 2

## 2021-01-26 MED FILL — POLYSPORIN 500 UNIT-10,000 UNIT/GRAM TOPICAL OINTMENT: 500-10000 500-10,000 unit/gram | TOPICAL | Qty: 14.2

## 2021-01-26 MED FILL — ONDANSETRON HCL (PF) 4 MG/2 ML INJECTION SOLUTION: 4 4 mg/2 mL | INTRAMUSCULAR | Qty: 2

## 2021-01-26 MED FILL — OXYCODONE 5 MG TABLET: 5 5 MG | ORAL | Qty: 3

## 2021-01-26 MED FILL — THERA 400 MCG TABLET: 400 400 mcg | ORAL | Qty: 1

## 2021-01-26 MED FILL — OXYCODONE 5 MG TABLET: 5 5 MG | ORAL | Qty: 1

## 2021-01-26 MED FILL — HEPARIN (PORCINE) 5,000 UNIT/ML INJECTION SOLUTION: 5000 5,000 unit/mL | INTRAMUSCULAR | Qty: 1

## 2021-01-26 MED FILL — ACETAMINOPHEN 650 MG/20.3 ML ORAL SOLUTION: 650 650 mg/20.3 mL | ORAL | Qty: 20.3

## 2021-01-26 MED FILL — VITAMIN B-1 (MONONITRATE) 100 MG TABLET: 100 100 mg | ORAL | Qty: 1

## 2021-01-26 MED FILL — FOLIC ACID 1 MG TABLET: 1 1 MG | ORAL | Qty: 1

## 2021-01-26 NOTE — Unmapped (Addendum)
[facility]  DEPARTMENT OF ANESTHESIOLOGY  PRE-PROCEDURAL EVALUATION    Alexander [name] is a 35 y.o. year old male presenting for:    Procedure(s):  OPEN REDUCTION INTERNAL FIXATION MANDIBLE, MAXILLOMANDIBULAR FIXATION    Surgeon:   Ellery Plunk, MD    Chief Complaint         Review of Systems     Anesthesia Evaluation    Patient summary reviewed and nursing notes reviewed.       I have reviewed the History and Physical Exam, any relevant changes are noted in the anesthesia pre-operative evaluation.      Cardiovascular:    Hypertension is poorly controlled.    (-) past MI.    Neuro/Muscoloskeletal/Psych:    (+) back problem, anxiety, bipolar disorder and depression.  Seizures (seizures years ago from alcohol withdrawal).    Comments: H/o drug and etoh abuse  Marijuana, heroin, cocaine    Pulmonary:     COPD.    (-) asthma, recent URI.   ROS comment: Every day smoker      GI/Hepatic/Renal:    (+) liver disease.  Hepatitis.    (-) GERD, renal disease.    Endo/Other:    (+) HIV.      (-) no bleeding disorder, no clotting disorder.       Past Medical History     Past Medical History:   Diagnosis Date   ??? Anxiety    ??? Bipolar disorder (CMS Dx)    ??? Bronchitis    ??? Depression    ??? Drug abuse and dependence (CMS Dx)    ??? H/O tooth extraction    ??? Hepatitis C    ??? HIV infection (CMS Dx)    ??? Liver disease Hep C   ??? PTSD (post-traumatic stress disorder)    ??? Substance induced mood disorder (CMS Dx)    ??? Thyroid disease        Past Surgical History     Past Surgical History:   Procedure Laterality Date   ??? MANDIBLE SURGERY         Family History     Family History   Problem Relation Age of Onset   ??? Drug abuse Brother    ??? Suicidality Brother    ??? Hypertension Mother    ??? Transient ischemic attack Mother    ??? Seizures Father    ??? Hypertension Father    ??? Cancer Father    ??? Breast Cancer Father    ??? Suicidality Father    ??? Drug abuse Brother    ??? Suicidality Brother        Social History     Social History     Socioeconomic  History   ??? Marital status: Single     Spouse name: Not on file   ??? Number of children: Not on file   ??? Years of education: Not on file   ??? Highest education level: Not on file   Occupational History   ??? Not on file   Tobacco Use   ??? Smoking status: Current Every Day Smoker     Packs/day: 1.00     Years: 11.00     Pack years: 11.00     Types: Cigarettes   ??? Smokeless tobacco: Never Used   Substance and Sexual Activity   ??? Alcohol use: Yes     Comment: last drink this AM, beer   ??? Drug use: Yes     Types: Heroin, Other-see comments,  Marijuana, Cocaine     Comment: fentanyl last night    ??? Sexual activity: Yes     Partners: Female, Male     Birth control/protection: Condom, Other-see comments   Other Topics Concern   ??? Caffeine Use Yes   ??? Occupational Exposure No   ??? Exercise No   ??? Seat Belt Yes   Social History Narrative   ??? Not on file     Social Determinants of Health     Financial Resource Strain: Not on file   Physical Activity: Not on file   Stress: Not on file   Social Connections: Not on file   Housing Stability: Not on file       Medications     Allergies:  No Known Allergies    Home Meds:  Prior to Admission medications as of 10/02/20 1856   Medication Sig Taking?   metoprolol tartrate (LOPRESSOR) 25 MG tablet Take 25 mg by mouth 2 times a day.           naloxone (NARCAN) 4 mg/actuation Spry Apply 1 spray in one nostril if needed. Call 911. May repeat dose in other nostril if no response in 3 minutes.    TRIUMEQ 600-50-300 mg Tab TAKE ONE TABLET BY MOUTH DAILY        Inpatient Meds:  Scheduled:   ??? acetaminophen (TYLENOL) 325 mg/10.15 mL oral solution  650 mg Oral Q5HRS   ??? bacitracin zinc-polymyxin B   Topical BID   ??? diazePAM  8 mg Oral Q6H   ??? folic acid  1 mg Oral Daily 0900    And   ??? multivitamin with folic acid  1 tablet Oral Daily 0900    And   ??? thiamine  100 mg Oral Daily 0900   ??? heparin  5,000 Units Subcutaneous 3 times per day     Continuous:   ??? electrolyte 110 mL/hr (01/26/21 1351)        PRN: LORazepam **OR** LORazepam, ondansetron, oxyCODONE **OR** oxyCODONE **OR** oxyCODONE    Vital Signs     Wt Readings from Last 3 Encounters:   10/03/20 140 lb (63.5 kg)   08/07/20 135 lb 3.2 oz (61.3 kg)   04/12/20 151 lb 12.8 oz (68.9 kg)     Ht Readings from Last 3 Encounters:   10/03/20 5' 9 (1.753 m)   08/06/20 5' 9 (1.753 m)   04/11/20 5' 9 (1.753 m)     Temp Readings from Last 3 Encounters:   01/26/21 98.3 ??F (36.8 ??C) (Oral)   10/04/20 98 ??F (36.7 ??C) (Oral)   08/07/20 98.3 ??F (36.8 ??C) (Oral)     BP Readings from Last 3 Encounters:   01/26/21 (!) 171/118   10/04/20 (!) 193/108   08/07/20 139/83     Pulse Readings from Last 3 Encounters:   01/26/21 100   10/04/20 52   08/07/20 94     @LASTSAO2 (3)@    Physical Exam     Airway:     Mallampati: II  Mouth Opening: < = 2 FB  TM distance: > = 3 FB  Neck ROM: full  (+) facial hair     Dental:        Comment: Poor dentition multiple missing teeth      Pulmonary:   - normal exam     Cardiovascular:  - normal exam     Neuro/Musculoskeletal/Psych:  - normal neurological exam.         Abdominal:    -  normal exam    Current OB Status:       Other Findings:        Laboratory Data     Lab Results   Component Value Date    WBC 9.3 01/24/2021    HGB 17.1 01/24/2021    HCT 51.6 (H) 01/24/2021    MCV 87.1 01/24/2021    PLT 166 01/24/2021       No results found for: [facility]    Lab Results   Component Value Date    GLUCOSE 117 (H) 01/24/2021    BUN 4 (L) 01/24/2021    CO2 25 01/24/2021    CREATININE 0.69 01/24/2021    K 3.8 01/25/2021    NA 133 01/24/2021    CL 94 (L) 01/24/2021    CALCIUM 10.1 01/24/2021    ALBUMIN 4.3 01/24/2021    PROT 9.6 (H) 01/24/2021    ALKPHOS 140 (H) 01/24/2021    ALT 98 (H) 01/24/2021    AST 261 (H) 01/24/2021    BILITOT 0.8 01/24/2021       Lab Results   Component Value Date    INR 1.1 10/04/2020       No results found for: PREGTESTUR, PREGSERUM, HCG, HCGQUANT    Anesthesia Plan     ASA 3             Anesthesia Type:  general.      PONV Risk  Factors:  plan for postoperative opioid use.                  (General anesthesia, Standard ASA monitors, PIV, Multimodal Analgesia, PACU postop)  Anesthetic plan and risks discussed with patient.        Use of blood products discussed with patient who.       Plan discussed with attending.

## 2021-01-26 NOTE — Unmapped (Signed)
Addiction Consult Service    Patient declined speaking with addiction services stating he in too much pain to do unnecessary talking, would be open to speaking with [place] once his jaw pain is better controlled. He is well known to the service for both alcohol and opioid use.     Alcohol use disorder, opioid use disorder  -Agree with current CIWA and dosed BDZ protocol   -Not a good methadone candidate given current need for BDZs for alcohol withdrawal and opioids for pain; will also avoid buprenorphine while patient requires opioids for pain  -Noted that patient has 15 mg roxi PRN but has yet to be administered  -Recommend better pain control in this patient with likely high opioid tolerance     Acquanetta Belling, MD  PGY-1 Psychiatry
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
## 2021-01-26 NOTE — Unmapped (Signed)
[place]  Social Work Department  Progress Note    Patient Information     Hospital day: 0  Inpatient/Observation:  Observation   Level of Care:  OBS  Admit date:  01/24/2021  Admission diagnosis: No admission diagnoses are documented for this encounter.    PMH:  has a past medical history of Anxiety, Bipolar disorder (CMS Dx), Bronchitis, Depression, Drug abuse and dependence (CMS Dx), H/O tooth extraction, Hepatitis C, HIV infection (CMS Dx), Liver disease (Hep C), PTSD (post-traumatic stress disorder), Substance induced mood disorder (CMS Dx), and Thyroid disease.    PCP:  Herbert Pun, MD    Home Pharmacy:    [facility] [address], [place] - [address] AT [facility] [address]  [address]  [place] [address]  Phone: [phone]     [facility]  [address] [place] [address]  Phone: [phone]     [facility] [place], [place] - [address]  [address] [place] [address]  Phone: [phone]     [facility] [address] [place], [place] - [address] AT [facility] [address]  [address]  [place] [address]  Phone: [phone]     [facility]  [address]  [place] [address]  Phone: [phone]     [facility] [address] [place] - [address]  [address] [place] [address]  Phone: [phone]         Medical Insurance Coverage:  Payor: LIABILITY / Plan: COMMERICAL LIABILITY / Product Type: Indemnity /     Other Pertinent Information     SW attended morning rounds with the medical team. Per MD, the patient is not medically ready for dc. Patient awaiting OR plans. Addiction Medicine Consulted. At this time, the patient does not have any psychosocial or dc needs that requires SW intervention. SW will continue to follow; will intervene in accordance with the patient's clinical course.     Discharge Plan     Anticipated discharge plan:  Likely Home; No Needs.      Anticipated discharge date:  Pending  clinical trajectory.     SW to follow.    Altamease Oiler. Albertina Parr, MSW, [facility]  Critical Care / MICU SW  [phone]

## 2021-01-27 DIAGNOSIS — S02651A Fracture of angle of right mandible, initial encounter for closed fracture: Principal | ICD-10-CM

## 2021-01-27 MED ORDER — fentaNYL (SUBLIMAZE) injection 25 mcg
50 | INTRAMUSCULAR | Status: AC | PRN
Start: 2021-01-27 — End: 2021-01-27
  Administered 2021-01-27 (×4): 25 ug via INTRAVENOUS

## 2021-01-27 MED ORDER — AMOXicillin-clavulanate (AUGMENTIN) 875-125 mg per tablet 1 tablet
875-125 | Freq: Two times a day (BID) | ORAL | Status: AC
Start: 2021-01-27 — End: 2021-01-27

## 2021-01-27 MED ORDER — esmoloL (BREVIBLOC) injection
100 | INTRAVENOUS | Status: AC | PRN
Start: 2021-01-27 — End: 2021-01-27
  Administered 2021-01-27: 14:00:00 20 via INTRAVENOUS

## 2021-01-27 MED ORDER — metoprolol tartrate (LOPRESSOR) 5 mg/5 mL injection
5 | INTRAVENOUS | Status: AC
Start: 2021-01-27 — End: ?

## 2021-01-27 MED ORDER — oxymetazoline (AFRIN) 0.05 % nasal spray
0.05 | NASAL | Status: AC
Start: 2021-01-27 — End: ?

## 2021-01-27 MED ORDER — dexamethasone (DECADRON) 4 mg/mL injection
4 | INTRAMUSCULAR | Status: AC
Start: 2021-01-27 — End: ?

## 2021-01-27 MED ORDER — fentaNYL (SUBLIMAZE) 50 mcg/mL injection
50 | INTRAMUSCULAR | Status: AC
Start: 2021-01-27 — End: ?

## 2021-01-27 MED ORDER — fentaNYL (SUBLIMAZE) injection 12.5 mcg
50 | INTRAMUSCULAR | Status: AC | PRN
Start: 2021-01-27 — End: 2021-01-27

## 2021-01-27 MED ORDER — ondansetron (ZOFRAN) injection 4 mg
4 | Freq: Three times a day (TID) | INTRAMUSCULAR | Status: AC | PRN
Start: 2021-01-27 — End: 2021-01-27

## 2021-01-27 MED ORDER — propofol 10 mg/ml (DIPRIVAN) injection
10 | INTRAVENOUS | Status: AC | PRN
Start: 2021-01-27 — End: 2021-01-27
  Administered 2021-01-27: 14:00:00 250 via INTRAVENOUS

## 2021-01-27 MED ORDER — glucose chewable tablet 12 g
4 | ORAL | Status: AC | PRN
Start: 2021-01-27 — End: 2021-01-27

## 2021-01-27 MED ORDER — esmoloL (BREVIBLOC) 100 mg/10 mL (10 mg/mL) injection
100 | INTRAVENOUS | Status: AC
Start: 2021-01-27 — End: ?

## 2021-01-27 MED ORDER — propofol 10 mg/ml (DIPRIVAN) 10 mg/mL injection
10 | INTRAVENOUS | Status: AC
Start: 2021-01-27 — End: ?

## 2021-01-27 MED ORDER — HYDROmorphone (DILAUDID) 1 mg/mL injection Syrg
1 | INTRAMUSCULAR | Status: AC
Start: 2021-01-27 — End: ?

## 2021-01-27 MED ORDER — clindamycin (CLEOCIN) 150 mg/mL injection
150 | INTRAMUSCULAR | Status: AC
Start: 2021-01-27 — End: ?

## 2021-01-27 MED ORDER — naloxone (NARCAN) injection 0.04 mg
0.4 | INTRAMUSCULAR | Status: AC | PRN
Start: 2021-01-27 — End: 2021-01-27

## 2021-01-27 MED ORDER — nicotine (NICODERM CQ) 7 mg/24 hr 1 patch
7 | Freq: Every day | TRANSDERMAL | Status: AC
Start: 2021-01-27 — End: 2021-01-27

## 2021-01-27 MED ORDER — oxyCODONE (ROXICODONE) immediate release tablet 2.5 mg
5 | ORAL | Status: AC | PRN
Start: 2021-01-27 — End: 2021-01-27

## 2021-01-27 MED ORDER — lactated Ringers infusion
INTRAVENOUS | Status: AC | PRN
Start: 2021-01-27 — End: 2021-01-27
  Administered 2021-01-27: 14:00:00 via INTRAVENOUS

## 2021-01-27 MED ORDER — abacavirdolutegravirlamivudTRIUMEQ60050300mgtablet1tablet
600-50-300 | Freq: Every day | ORAL | Status: AC
Start: 2021-01-27 — End: 2021-01-27

## 2021-01-27 MED ORDER — HYDROmorphone (DILAUDID) injection Syrg 0.2 mg
0.5 | INTRAMUSCULAR | Status: AC | PRN
Start: 2021-01-27 — End: 2021-01-27

## 2021-01-27 MED ORDER — diazePAM (VALIUM) tablet 15 mg
5 | Freq: Three times a day (TID) | ORAL | Status: AC
Start: 2021-01-27 — End: 2021-01-27
  Administered 2021-01-27: 18:00:00 15 mg via ORAL

## 2021-01-27 MED ORDER — gentamicin (GARAMYCIN) injection
40 | INTRAMUSCULAR | Status: AC | PRN
Start: 2021-01-27 — End: 2021-01-27
  Administered 2021-01-27: 15:00:00 320

## 2021-01-27 MED ORDER — chlorhexidine (PERIDEX) 0.12 % solution
0.12 | Status: AC | PRN
Start: 2021-01-27 — End: 2021-01-27
  Administered 2021-01-27: 15:00:00 15

## 2021-01-27 MED ORDER — lidocaine-EPINEPHrine 1 %-1:100,000 injection
1 | INTRAMUSCULAR | Status: AC | PRN
Start: 2021-01-27 — End: 2021-01-27
  Administered 2021-01-27: 15:00:00 4

## 2021-01-27 MED ORDER — midazolam (PF) (VERSED) injection
1 | INTRAMUSCULAR | Status: AC | PRN
Start: 2021-01-27 — End: 2021-01-27
  Administered 2021-01-27: 14:00:00 2 via INTRAVENOUS

## 2021-01-27 MED ORDER — ondansetron (ZOFRAN) 4 mg/2 mL injection
4 | INTRAMUSCULAR | Status: AC
Start: 2021-01-27 — End: ?

## 2021-01-27 MED ORDER — metoprolol tartrate (LOPRESSOR) injection
5 | INTRAVENOUS | Status: AC | PRN
Start: 2021-01-27 — End: 2021-01-27
  Administered 2021-01-27: 15:00:00 10 via INTRAVENOUS

## 2021-01-27 MED ORDER — gabapentin (NEURONTIN) capsule 600 mg
300 | ORAL | Status: AC | PRN
Start: 2021-01-27 — End: 2021-01-27
  Administered 2021-01-27: 14:00:00 600 mg via ORAL

## 2021-01-27 MED ORDER — hydrALAZINE (APRESOLINE) 20 mg/mL injection 5 mg
20 | Freq: Four times a day (QID) | INTRAMUSCULAR | Status: AC | PRN
Start: 2021-01-27 — End: 2021-01-27
  Administered 2021-01-27: 18:00:00 5 mg via INTRAVENOUS

## 2021-01-27 MED ORDER — nicotine (NICODERM CQ) 7 mg/24 hr 1 patch
7 | Freq: Every day | TRANSDERMAL | Status: AC
Start: 2021-01-27 — End: 2021-01-27
  Administered 2021-01-27: 06:00:00 1 via TRANSDERMAL

## 2021-01-27 MED ORDER — glycopyrrolate (ROBINUL) injection
0.2 | INTRAMUSCULAR | Status: AC | PRN
Start: 2021-01-27 — End: 2021-01-27
  Administered 2021-01-27: 14:00:00 .2 via INTRAVENOUS

## 2021-01-27 MED ORDER — dextrose 50 % in water (D50W) iv Syrg 50 mL
INTRAVENOUS | Status: AC | PRN
Start: 2021-01-27 — End: 2021-01-27

## 2021-01-27 MED ORDER — labetaloL (NORMODYNE) injection
5 | INTRAVENOUS | Status: AC | PRN
Start: 2021-01-27 — End: 2021-01-27
  Administered 2021-01-27: 15:00:00 10 via INTRAVENOUS

## 2021-01-27 MED ORDER — proMETHazine (PHENERGAN) injection 6.25 mg
25 | Freq: Four times a day (QID) | INTRAMUSCULAR | Status: AC | PRN
Start: 2021-01-27 — End: 2021-01-27

## 2021-01-27 MED ORDER — midazolam (PF) (VERSED) 1 mg/mL injection
1 | INTRAMUSCULAR | Status: AC
Start: 2021-01-27 — End: ?

## 2021-01-27 MED ORDER — dexamethasone (DECADRON) injection
4 | INTRAMUSCULAR | Status: AC | PRN
Start: 2021-01-27 — End: 2021-01-27
  Administered 2021-01-27: 15:00:00 8 via INTRAVENOUS

## 2021-01-27 MED ORDER — HYDROmorphone (DILAUDID) injection Syrg 0.5 mg
0.5 | INTRAMUSCULAR | Status: AC | PRN
Start: 2021-01-27 — End: 2021-01-27
  Administered 2021-01-27 (×4): 0.5 mg via INTRAVENOUS

## 2021-01-27 MED ORDER — chlorhexidine (PERIDEX) 0.12 % solution
0.12 | Status: AC
Start: 2021-01-27 — End: ?

## 2021-01-27 MED ORDER — lidocaine (XYLOCAINE) 2 % jelly
2 | Status: AC
Start: 2021-01-27 — End: ?

## 2021-01-27 MED ORDER — HYDROmorphone (DILAUDID) injection Syrg
1 | INTRAMUSCULAR | Status: AC | PRN
Start: 2021-01-27 — End: 2021-01-27
  Administered 2021-01-27: 15:00:00 1 via INTRAVENOUS

## 2021-01-27 MED ORDER — sodium chloride 0.9 % irrigation
0.9 | Status: AC | PRN
Start: 2021-01-27 — End: 2021-01-27
  Administered 2021-01-27: 15:00:00 1000

## 2021-01-27 MED ORDER — oxyCODONE (ROXICODONE) immediate release tablet 5 mg
5 | ORAL | Status: AC | PRN
Start: 2021-01-27 — End: 2021-01-27

## 2021-01-27 MED ORDER — dextrose 50 % in water (D50W) iv Syrg 25 mL
INTRAVENOUS | Status: AC | PRN
Start: 2021-01-27 — End: 2021-01-27

## 2021-01-27 MED ORDER — labetaloL (NORMODYNE) 5 mg/mL injection
5 | INTRAVENOUS | Status: AC
Start: 2021-01-27 — End: ?

## 2021-01-27 MED ORDER — fentaNYL (SUBLIMAZE) injection
50 | INTRAMUSCULAR | Status: AC | PRN
Start: 2021-01-27 — End: 2021-01-27
  Administered 2021-01-27 (×2): 100 via INTRAVENOUS
  Administered 2021-01-27: 14:00:00 150 via INTRAVENOUS
  Administered 2021-01-27 (×2): 50 via INTRAVENOUS

## 2021-01-27 MED ORDER — chlorhexidine (PERIDEX) 0.12 % solution 15 mL
0.12 | Freq: Two times a day (BID) | Status: AC
Start: 2021-01-27 — End: 2021-01-27

## 2021-01-27 MED ORDER — lidocaine (PF) 2% (20 mg/mL) Soln
20 | INTRAMUSCULAR | Status: AC | PRN
Start: 2021-01-27 — End: 2021-01-27
  Administered 2021-01-27: 14:00:00 40 via INTRAVENOUS

## 2021-01-27 MED ORDER — clindamycin (CLEOCIN) IVPB 900 mg
900 | INTRAVENOUS | Status: AC | PRN
Start: 2021-01-27 — End: 2021-01-27
  Administered 2021-01-27: 14:00:00 900 via INTRAVENOUS

## 2021-01-27 MED ORDER — rocuronium (ZEMURON) injection
10 | INTRAVENOUS | Status: AC | PRN
Start: 2021-01-27 — End: 2021-01-27
  Administered 2021-01-27: 14:00:00 50 via INTRAVENOUS

## 2021-01-27 MED FILL — HYDRALAZINE 20 MG/ML INJECTION SOLUTION: 20 20 mg/mL | INTRAMUSCULAR | Qty: 1

## 2021-01-27 MED FILL — LORAZEPAM 2 MG/ML INJECTION SOLUTION: 2 2 mg/mL | INTRAMUSCULAR | Qty: 1

## 2021-01-27 MED FILL — LORAZEPAM 2 MG/ML INJECTION SOLUTION: 2 2 mg/mL | INTRAMUSCULAR | Qty: 2

## 2021-01-27 MED FILL — ONDANSETRON HCL (PF) 4 MG/2 ML INJECTION SOLUTION: 4 4 mg/2 mL | INTRAMUSCULAR | Qty: 2

## 2021-01-27 MED FILL — METOPROLOL TARTRATE 5 MG/5 ML INTRAVENOUS SOLUTION: 5 5 mg/5 mL | INTRAVENOUS | Qty: 5

## 2021-01-27 MED FILL — LIDOCAINE HCL 2 % MUCOSAL JELLY: 2 2 % | Qty: 30

## 2021-01-27 MED FILL — CLINDAMYCIN 150 MG/ML INJECTION SOLUTION: 150 150 mg/mL | INTRAMUSCULAR | Qty: 4

## 2021-01-27 MED FILL — PROPOFOL 10 MG/ML INTRAVENOUS EMULSION: 10 10 mg/mL | INTRAVENOUS | Qty: 20

## 2021-01-27 MED FILL — DIAZEPAM 5 MG TABLET: 5 5 MG | ORAL | Qty: 3

## 2021-01-27 MED FILL — HEPARIN (PORCINE) 5,000 UNIT/ML INJECTION SOLUTION: 5000 5,000 unit/mL | INTRAMUSCULAR | Qty: 1

## 2021-01-27 MED FILL — FENTANYL (PF) 50 MCG/ML INJECTION SOLUTION: 50 50 mcg/mL | INTRAMUSCULAR | Qty: 2

## 2021-01-27 MED FILL — DILAUDID (PF) 0.5 MG/0.5 ML INJECTION SYRINGE: 0.5 0.5 mg/0.5 mL | INTRAMUSCULAR | Qty: 0.5

## 2021-01-27 MED FILL — DEXAMETHASONE SODIUM PHOSPHATE 4 MG/ML INJECTION SOLUTION: 4 4 mg/mL | INTRAMUSCULAR | Qty: 1

## 2021-01-27 MED FILL — DIAZEPAM 2 MG TABLET: 2 2 MG | ORAL | Qty: 2

## 2021-01-27 MED FILL — HYDROMORPHONE 1 MG/ML INJECTION SYRINGE: 1 1 mg/mL | INTRAMUSCULAR | Qty: 1

## 2021-01-27 MED FILL — GABAPENTIN 300 MG CAPSULE: 300 300 MG | ORAL | Qty: 2

## 2021-01-27 MED FILL — CHLORHEXIDINE GLUCONATE 0.12 % MOUTHWASH: 0.12 0.12 % | Qty: 15

## 2021-01-27 MED FILL — ACETAMINOPHEN 650 MG/20.3 ML ORAL SOLUTION: 650 650 mg/20.3 mL | ORAL | Qty: 20.3

## 2021-01-27 MED FILL — NASAL SPRAY (OXYMETAZOLINE) 0.05 %: 0.05 0.05 % | NASAL | Qty: 30

## 2021-01-27 MED FILL — MIDAZOLAM (PF) 1 MG/ML INJECTION SOLUTION: 1 1 mg/mL | INTRAMUSCULAR | Qty: 2

## 2021-01-27 MED FILL — PROMETHAZINE 25 MG/ML INJECTION SOLUTION: 25 25 mg/mL | INTRAMUSCULAR | Qty: 1

## 2021-01-27 MED FILL — LABETALOL 5 MG/ML INTRAVENOUS SOLUTION: 5 5 mg/mL | INTRAVENOUS | Qty: 20

## 2021-01-27 MED FILL — ESMOLOL 100 MG/10 ML (10 MG/ML) INTRAVENOUS SOLUTION: 100 100 mg/10 mL (10 mg/mL) | INTRAVENOUS | Qty: 10

## 2021-01-27 MED FILL — TRIUMEQ 600 MG-50 MG-300 MG TABLET: 600-50-300 600-50-300 mg | ORAL | Qty: 1

## 2021-01-27 MED FILL — OXYCODONE 5 MG TABLET: 5 5 MG | ORAL | Qty: 2

## 2021-01-27 MED FILL — NICOTINE 7 MG/24 HR DAILY TRANSDERMAL PATCH: 7 7 mg/24 hr | TRANSDERMAL | Qty: 1

## 2021-01-27 MED FILL — FENTANYL (PF) 50 MCG/ML INJECTION SOLUTION: 50 50 mcg/mL | INTRAMUSCULAR | Qty: 5

## 2021-01-27 NOTE — Unmapped (Signed)
SW notified by nursing staff that this patient is not allowed to have visitors until further notice. It was reported that someone may have been called to bring drugs to bedside that are not prescribed to him. SW notified [facility]. If there are changes please let us know.     This will be passed on to oncoming shifts.    Respectfully submitted,     Soriah Leeman L. Pecola Leisure MSW, [facility]house [facility] For [facility]  [phone]
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
## 2021-01-27 NOTE — Unmapped (Signed)
INTRA-OP POST BRIEFING NOTE: Alexander [name]      Specimens:     Prior to leaving the room: Nurse confirmed name of procedure, completion of instrument, sponge & needle counts, reads specimen labels aloud including patient name and addresses any equipment issues? Nurse confirmed wound class. Nurse to surgeon and anesthesia: What are key concerns for recovery and management of the patient?  Yes      Blood products stored at appropriate temperatures prior to return to blood bank (if applicable)? N/A      Patient identification band secured on patient prior to transfer out of the operating room? Yes    Temporary devices implanted for the duration of the surgery removed and evaluated for intactness and completeness prior to closure? N/A      Other Comments:     Signed: Annistyn Depass    Date: 01/27/2021    Time: 10:18 AM

## 2021-01-27 NOTE — Unmapped (Signed)
Spoke with the same day surgery RN about the patient. He is currently on the way to same day with transport.

## 2021-01-27 NOTE — Unmapped (Signed)
Addiction Consult Service     Patient in peri-op/OR for most of morning and afternoon. Attempt to assess unsuccessful, will re-attempt in AM. No changes recommended at this time.     Acquanetta Belling, MD  PGY-1 Psychiatry

## 2021-01-27 NOTE — Unmapped (Signed)
Anesthesia Transfer of Care Note    Patient: Alexander [name]  Procedure(s) Performed: Procedure(s):  OPEN REDUCTION INTERNAL FIXATION MANDIBLE, MAXILLOMANDIBULAR FIXATION    Patient location: PACU    Anesthesia type: general    Airway Device on Arrival to PACU/ICU: Nasal Cannula    IV Access: Peripheral    Monitors Recommended to be Used During PACU/ICU: Standard Monitors    Outstanding Issues to Address: None    Level of Consciousness: alert     Post vital signs:    Vitals:    01/27/21 1044   BP: (!) 172/106   Pulse: 72   Resp: 18   Temp: 97.6 ??F (36.4 ??C)   SpO2: 100%       Complications:  There were no known complications for this encounter.    Date 01/26/21 0700 - 01/27/21 0659 01/27/21 0700 - 01/28/21 0659   Shift 0700-1459 1500-2259 2300-0659 24 Hour Total 0700-1459 1500-2259 2300-0659 24 Hour Total   INTAKE   Shift Total(mL/kg)           OUTPUT   Urine   1000 1000 75   75     Urine   1000 1000 75   75   Shift Total(mL/kg)   1000 1000 75(1.2)   75(1.2)   Weight (kg)     63.5 63.5 63.5 63.5
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
## 2021-01-27 NOTE — Unmapped (Signed)
The patients family member called and notified us that the patient is calling his drug friends and is trying to hase them bring him drugs to the hospital. I notified the social worker and she is making the [facility] aware that for now the patient can not have any visitors.

## 2021-01-27 NOTE — Unmapped (Signed)
Pt refused to sit in a chair so I can put a sheet on his bed, he wants to stay like he is on the plastic. He told me I could do it later but not now. I will try again in an hour.
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
## 2021-01-27 NOTE — Unmapped (Signed)
Clicked off previous shifts orders for ct scan's.

## 2021-01-27 NOTE — Unmapped (Signed)
1036:  Patient arrived to PACU from OR to bay 1.  Placed on bedside monitor per protocol.  Report received from OR RN and anesthesia staff.  Patient's BP very high on arrival, but per anesthesia this is his current baseline due to withdrawing from ETOH and multiple other substances.  Otherwise stable.  Patient moaning/yelling on arrival, not cooperative, attempting to pull off monitor leads and pulling at PIV and foley catheter.      1115:  Patient c/o 10/10 pain.  RN treating per PACU orders.  Patient also very restless and agitated in waves.  Attempting to get OOB and leave, trying to pull at his lines and pulling off his dressing.    1210:  Patient with increasing agitation and aggressiveness.  Will not stay in bed or keep any monitors on.  Nearly pulled out PIV.  CIWA score currently 25.  RN giving PRN ativan per orders and charge RN calling patient's primary team.

## 2021-01-27 NOTE — Unmapped (Signed)
Brief ENT Note    Talked to the patient after being informed that he would like to leave AMA. I discussed that he had expressed interest in assistance with his alcohol and heroin use, and based on that interest, we had reached out to Internal Medicine to help with addressing those issues. He endorsed having had those conversations and expressed that his opinions had changed, and he'd instead like to pursue treatment for his alcohol and heroin use in [place] near his family. I asked him to stay a little bit longer so that we could coordinate follow up for him and then safely, properly discharge him, but he was not willing to wait. I explained that leaving before he was properly discharged would be leaving AMA. We discussed the risks of leaving AMA including inability to address bleeding, swelling, or infection of the wound as well as the danger of ongoing alcohol withdrawal. I also noted that leaving AMA may have financial implications with insurance. He understood these risks and decided to leave AMA.

## 2021-01-27 NOTE — Unmapped (Signed)
Anesthesia Extubation Criteria:    Airway Device: endotracheal tube    Emergence Details:      Smooth      _x_      Stormy       __       Prolonged   __     Extubation Criteria:      Motor strength intact       _x_      Follows commands        _x_      Good airway reflexes      _x_      OP suctioned                  _x_        Follows commands:  Yes     Patient extubated:  Yes

## 2021-01-27 NOTE — Unmapped (Signed)
Anesthesia Post Note    Patient: Alexander [name]    Procedure(s) Performed: Procedure(s):  OPEN REDUCTION INTERNAL FIXATION MANDIBLE, MAXILLOMANDIBULAR FIXATION    Anesthesia type: general    Patient location: PACU    Airway: Patent    Post pain: Adequate analgesia    Nausea / Vomiting: Absent    Post-operative Hydration Status: Adequate    Post assessment: no apparent anesthetic complications    Last Vitals:   Vitals:    01/27/21 1245 01/27/21 1300 01/27/21 1310 01/27/21 1315   BP: (!) 176/104 153/87 153/87 (!) 143/91   BP Location:       Patient Position:       Pulse: 64 65 130 95   Resp: 24 13 (!) 35 26   Temp:       TempSrc:       SpO2: 100% 100%  99%   Weight:       Height:            Post vital signs: stable    Level of consciousness: awake       Complications:  There were no known complications for this encounter.     Patient leaving AMA. Stable post anesthesia

## 2021-01-27 NOTE — Unmapped (Signed)
Patient became very aggressive and agitated again.  RN gave IV ativan per orders/per CIWA.  Did not calm patient.  Patient forcefully getting out of bed despite RN and PCA trying to stop him.  Patient ripping off monitor leads and O2 monitor.  Patient spitting blood on his chest and wiping it with his bare hands and then touching everything.  RN and other workers unable to control patient or reason with him.  Patient kept yelling that he wants to leave and saying he wants a cigarette.  Offered a nicotine patch, patient refused.  Patient trying to yank out his own foley, so RN removed it safely by deflating the balloon.      ENT MD Dr Moses Manners came to bedside.  MD also tried to talk to patient about staying in the hospital to get treatment for ETOH and substance abuse vs waiting a couple of hours to be properly discharged, instead of leaving AMA.  MD calmly explained to patient all of the risks of his leaving AMA vs staying for a little longer to be properly discharged.  Patient still refused all of our help and kept forcefully getting out of bed and trying to get dressed.  RN removed both of patient's PIVs and covered the sites with gauze and tape.      Dr Vassie Loll and this RN reviewed the [facility] paper with patient, he signed and we witnessed.  Patient then left AMA with all of his belongings.
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
## 2021-02-19 MED ORDER — abacavir-dolutegravir-lamivud (TRIUMEQ) 600-50-300 mg Tab
600-50-300 | ORAL_TABLET | Freq: Every day | ORAL | 0 refills | Status: AC
Start: 2021-02-19 — End: 2021-03-09

## 2021-02-19 NOTE — Unmapped (Signed)
Spoke with pt's Case Manager at [facility], requesting to schedule pt with Dr Manson Passey and to have a refill of Triumeq sent to [facility] on [address] Pt scheduled with Dr Manson Passey on 03/09/21 @ 0940 as a NPV since last visit was on 10/17/17. Pended Rx refill to Dr Manson Passey. Voiced appreciation.

## 2021-02-19 NOTE — Unmapped (Signed)
Refill approved and sent for Triumeq.

## 2021-02-19 NOTE — Unmapped (Signed)
Pt called in requesting to schedule an appointment with MD Manson Passey.     Pt is also wanting to know whether he should still be taking the TRIUMEQ 600-50-300 mg Tab [[id number]]      Pt requests return call at [phone].

## 2021-03-09 ENCOUNTER — Ambulatory Visit: Admit: 2021-03-09 | Payer: PRIVATE HEALTH INSURANCE

## 2021-03-09 ENCOUNTER — Ambulatory Visit: Admit: 2021-03-09 | Discharge: 2021-03-09 | Payer: PRIVATE HEALTH INSURANCE

## 2021-03-09 DIAGNOSIS — B20 Human immunodeficiency virus [HIV] disease: Secondary | ICD-10-CM

## 2021-03-09 LAB — LYMPHOCYTE SUBSET PANEL 1
% CD 3 Pos. Lymph.: 93.2 % — ABNORMAL HIGH (ref 56.0–89.0)
% CD 4 Pos. Lymph.: 23.1 % — ABNORMAL LOW (ref 30.0–64.0)
% CD 8 Pos. Lymph.: 59.9 % — ABNORMAL HIGH (ref 6.0–43.0)
% NK (CD56): 1.7 % (ref 1.0–19.0)
Ab NK (CD56): 34.4 {cells}/uL (ref 24.0–460.0)
Absolute CD 3: 1888.2 {cells}/uL (ref 870.0–2532.0)
Absolute CD 4 Helper: 468 {cells}/uL (ref 255.0–2496.0)
Absolute CD 8 (Supp): 1213.6 {cells}/uL — ABNORMAL HIGH (ref 155.0–1010.0)
CD19 % B Cell: 1 % (ref 1.0–28.0)
CD19 Abs: 20.3 {cells}/uL — ABNORMAL LOW (ref 24.0–683.0)
CD4/CD8 Ratio: 0.39 ratio — ABNORMAL LOW (ref 1.10–3.70)

## 2021-03-09 LAB — RENAL FUNCTION PANEL W/EGFR
Albumin: 4.3 g/dL (ref 3.5–5.7)
Anion Gap: 7 mmol/L (ref 3–16)
BUN: 5 mg/dL (ref 7–25)
CO2: 32 mmol/L (ref 21–33)
Calcium: 9.5 mg/dL (ref 8.6–10.3)
Chloride: 99 mmol/L (ref 98–110)
Creatinine: 0.66 mg/dL (ref 0.60–1.30)
EGFR: >90
Glucose: 83 mg/dL (ref 70–100)
Osmolality, Calculated: 282 mOsm/kg (ref 278–305)
Phosphorus: 4.6 mg/dL (ref 2.1–4.7)
Potassium: 4.7 mmol/L (ref 3.5–5.3)
Sodium: 138 mmol/L (ref 133–146)

## 2021-03-09 LAB — HEPATIC FUNCTION PANEL
ALT: 235 U/L (ref 7–52)
AST: 470 U/L (ref 13–39)
Albumin: 4.3 g/dL (ref 3.5–5.7)
Alkaline Phosphatase: 125 U/L (ref 36–125)
Bilirubin, Direct: 0.23 mg/dL (ref 0.00–0.40)
Bilirubin, Indirect: 0.37 mg/dL (ref 0.00–1.10)
Total Bilirubin: 0.6 mg/dL (ref 0.0–1.5)
Total Protein: 8.1 g/dL (ref 6.4–8.9)

## 2021-03-09 LAB — CBC
Hematocrit: 44.9 % (ref 38.5–50.0)
Hemoglobin: 14.8 g/dL (ref 13.2–17.1)
MCH: 28.6 pg (ref 27.0–33.0)
MCHC: 32.9 g/dL (ref 32.0–36.0)
MCV: 87 fL (ref 80.0–100.0)
MPV: 9.8 fL (ref 7.5–11.5)
Platelets: 225 10E3/uL (ref 140–400)
RBC: 5.16 10E6/uL (ref 4.20–5.80)
RDW: 14.1 % (ref 11.0–15.0)
WBC: 6.3 10E3/uL (ref 3.8–10.8)

## 2021-03-09 LAB — TREPONEMA PALLIDUM AB WITH REFLEX: Treponema Pallidum: NEGATIVE

## 2021-03-09 LAB — LIPID PANEL
Cholesterol, Total: 133 mg/dL (ref 0–200)
HDL: 40 mg/dL — ABNORMAL LOW (ref 60–92)
LDL Cholesterol: 76 mg/dL
Triglycerides: 85 mg/dL (ref 10–149)

## 2021-03-09 LAB — HIV GENOSURE PRIME: GenoSure Prime ??: <200 copies/mL

## 2021-03-09 LAB — VITAMIN D 25 HYDROXY: Vit D, 25-Hydroxy: 20.1 ng/mL (ref 30.0–100.0)

## 2021-03-09 LAB — HEMOGLOBIN A1C: Hemoglobin A1C: 5.4 % (ref 4.0–5.6)

## 2021-03-09 LAB — HIV-1 RNA, QUANTITATIVE, PCR
HIV 1 Copies: 35 copies/mL
HIV log10copies: 1.544 log10copy/mL

## 2021-03-09 MED ORDER — ondansetron (ZOFRAN) 4 MG tablet
4 | ORAL_TABLET | Freq: Three times a day (TID) | ORAL | 11 refills | Status: AC | PRN
Start: 2021-03-09 — End: ?

## 2021-03-09 MED ORDER — abacavir-dolutegravir-lamivud (TRIUMEQ) 600-50-300 mg Tab
600-50-300 | ORAL_TABLET | Freq: Every day | ORAL | 11 refills | Status: AC
Start: 2021-03-09 — End: 2022-04-06

## 2021-03-09 NOTE — Unmapped (Addendum)
Please stop by the lab today and we can recheck your regular blood tests.    The number for [facility] is [phone] -- you can call this number to see about getting an appointment.    Please come back to the [facility] in about 4-6 weeks for your next visit with me. If you have any questions, problems, or concerns, please give me a call!
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
## 2021-03-09 NOTE — Unmapped (Signed)
Alexander [name] is a 35 y.o. male with a history of IDU, hepatitis C infection, & HIV infection who presents to the [facility] to reestablish care for HIV.      Chief Complaint: HIV  Chief Complaint   Patient presents with   ??? HIV Comprehensive Visit (Follow up)       HPI:  Alexander [name] is a 35 y.o. male diagnosed with HIV on 02/05/17. His initial IDC visit was on 02/20/17. He has not been diagnosed with AIDS. His CD4 nadir was 218 on 02/20/17. His self reported risk behavior for acquiring HIV was IDU. He has not experienced prior antiretroviral treatment failure.     His last visit to see me was on 10/17/17. His last CD4 was 506.0(26.7%) on 04/10/2020 and last viral load was Not Detected on 01/27/2020.    Today, we reviewed his medication adherence, and he reported missing a few doses on the last 7 days. He reports the following problems with their medications : nausea.    Alexander [name] has been trying to continue to take Triumeq during the past few years, but he reports that he has only been able to take it 'off and on.' The longest period that he was off ART was probably about a year. He recently started working with [facility] again (within the past couple of weeks) and is trying to work on taking his ART more consistently. He was able to refill the Triumeq a little over a week ago. He reports that he is homeless currently and has been sleeping outdoors. He denies any specific recent acute illness.    PHQ-9 score: 15 / 27, somewhat difficult; no SI    ROS:  Positives appear in bold; otherwise listed symptoms are negative.  General: fever, chills  Cardio: chest pain, palpitations  Pulm: cough, shortness of breath  GI: nausea, diarrhea  Skin: rash, itching     Social history reviewed with patient and updated as appropriate in the electronic medical record.    Physical Exam:  Blood pressure 128/81, pulse 71, temperature 97.7 ??F (36.5 ??C), temperature source Temporal, resp. rate 18, weight 150 lb (68 kg), SpO2 98 %.   General:  Well-appearing male in NAD.   HEENT: PERRL / EOMI. Sclerae anicteric. Mucous membranes moist. Oropharynx clear without lesions. Poor dentition.  Neck: No cervical / supraclavicular LAD.   CV/Chest: RRR with no r/g/m. S1 & S2 present. No peripheral edema.  Pulm: CTAB without w/r/r. Normal effort.  Abd: Soft, non-tender, non-distended. Bowel sounds present.   MSK: Able to move all 4 extremities. No joint effusions noted.  Skin: Warm & dry. No rash or skin lesions noted.  Neuro: No gross CN deficits noted. No tremor noted.  Psych: A&Ox3. Normal affect.    Current Outpatient Medications   Medication Sig   ??? Triumeq Take 1 tablet by mouth daily.   ??? naloxone Apply 1 spray in one nostril if needed. Call 911. May repeat dose in other nostril if no response in 3 minutes.     No current facility-administered medications for this visit.       Data Review:  Lab Results   Component Value Date    HGB 17.1 01/24/2021    HCT 51.6 (H) 01/24/2021    WBC 9.3 01/24/2021    PLT 166 01/24/2021     Lab Results   Component Value Date    NEUTOPHILPCT 41.4 01/24/2021    LYMPHOPCT 45.8 (H) 01/24/2021    MONOPCT 11.5 01/24/2021  EOSPCT 0.7 01/24/2021    BASOPCT 0.6 01/24/2021    NEUTROABS 3,850 01/24/2021    LYMPHSABS 4,259 (H) 01/24/2021    MONOSABS 1,070 (H) 01/24/2021    EOSABS 65 01/24/2021    BASOSABS 56 01/24/2021     Lab Results   Component Value Date    NA 133 01/24/2021    K 3.8 01/25/2021    CL 94 (L) 01/24/2021    CO2 25 01/24/2021    GLUCOSE 117 (H) 01/24/2021    BUN 4 (L) 01/24/2021    CREATININE 0.69 01/24/2021    MG 2.1 01/24/2021    PHOS 4.6 01/24/2021     Lab Results   Component Value Date    ALKPHOS 140 (H) 01/24/2021    AST 261 (H) 01/24/2021    ALT 98 (H) 01/24/2021    ALBUMIN 4.3 01/24/2021    BILIDIRECT 0.2 01/24/2021    BILITOT 0.8 01/24/2021    PROT 9.6 (H) 01/24/2021       Labs reviewed as above.    Health maintenance and screening:  Lab Results   Component Value Date    CHOLTOT 132 01/27/2020    TRIG 89 01/27/2020     HDL 40 (L) 01/27/2020    LDL 74 01/27/2020       HBA1c:   Lab Results   Component Value Date    HGBA1C 5.4 08/28/2019     ASCVD risk: The ASCVD Risk score Denman George DC Jr., et al., 2013) failed to calculate for the following reasons:    The 2013 ASCVD risk score is only valid for ages 90 to 67    Vitamin D:   Lab Results   Component Value Date    VITD25H 15.6 (L) 01/27/2020     DEXA: none    Hepatitis A screening:   Lab Results   Component Value Date    HEPAIGG Reactive (A) 04/01/2020     Hepatitis B screening:   Lab Results   Component Value Date    HEPBSAG Nonreactive 04/01/2020    HEPBCAB Nonreactive 04/01/2020    HBSAB Reactive (A) 04/01/2020     Hepatitis C screening:   Lab Results   Component Value Date    HCVAB Reactive (A) 02/20/2017    IU 1,610,960 08/28/2019    HEPCGENO 3 08/28/2019        TB screening:   Lab Results   Component Value Date    QUANTIFERTB NEGATIVE 02/20/2017     Syphilis screening:   Lab Results   Component Value Date    TREPIA Negative 01/27/2020    RPRMONSCRN Non-Reactive 11/15/2013     GC screening:   Lab Results   Component Value Date    [facility] Negative 02/20/2017       Anal cancer screening:   Colon cancer screening: due at age 44    HLA B5701:   Lab Results   Component Value Date    HLAB5701 Negative 02/20/2017       Immunizations:  Influenza: next due fall 2022  PCV: given (03/2017)  PPSV: dose #2 due  Tdap: next due 08/2025  Hep A: dose #2 due  Hep B: immune  MenACWY:   HPV:   Zoster: due at age 37  COVID-19:   Immunization History   Administered Date(s) Administered   ??? Hepatitis A, adult 05/16/2017   ??? Hepatitis B, pediatric, 3-dose 10/03/2000, 07/26/2001, 01/30/2004, 03/02/2004   ??? Influenza, trivalent, preservative-free 12/11/2013   ??? MMR 10/03/2000   ???  Meningococcal ACWY, MCV4, unspecified conjugate 08/11/2017   ??? Pneumococcal conjugate, 13-valent 03/07/2017   ??? Pneumococcal polysaccharide, 23-valent 12/11/2013   ??? Td 01/30/2004   ??? tdap 08/07/2015     Assessment and  Plan:  HIV infection  Recently resumed Triumeq after being off ART during most of the past year or more. His main barriers to medication adherence have been substance use & homelessness. He describes his motivation to resume / continue ART and work to improve his health.  - continue Triumeq (recently restarted)  - restaging / med monitoring labs today; will likely repeat in ~8 weeks  - discussed medication adherence; pt is having difficulties as above  - pt to meet w/ SW to explore additional options for social support    Preventative health care  - screening labs today including UA, urine GC, Trepia, A1c, lipids      Return to IDC in ~6 weeks.    This note includes material that was copied forward from an earlier note that was written by me on 10/17/17. I have reviewed and updated all portions of this note (including history, physical exam, data, assessment, & plan) so that it reflects my evaluation and management of the patient on 03/09/21.    Number and Complexity of Problems (Level 4)  During this encounter, I addressed 1 or more chronic illnesses with exacerbation, progression, or side effects of treatment    Amount and/or Complexity of Data Ordered, Reviewed, or Analyzed (Level 4)  I ordered 3+ test(s) including:HIV VL, CD4 ct, BMP, ....        Risk of Complication and/or Morbidity or Mortality of Patient Management (Level  4)  The patient's management entails Moderate risk of complications and/or morbidity or mortality.    Overall LOS:  4    Dicy Smigel ALLEN Rayni Nemitz

## 2021-03-11 NOTE — Unmapped (Signed)
Recently resumed Triumeq after being off ART during most of the past year or more. His main barriers to medication adherence have been substance use & homelessness. He describes his motivation to resume / continue ART and work to improve his health.  - continue Triumeq (recently restarted)  - restaging / med monitoring labs today; will likely repeat in ~8 weeks  - discussed medication adherence; pt is having difficulties as above  - pt to meet w/ SW to explore additional options for social support    Preventative health care  - screening labs today including UA, urine GC, Trepia, A1c, lipids

## 2021-04-13 ENCOUNTER — Encounter

## 2021-05-10 NOTE — Assessment & Plan Note (Signed)
Formatting of this note is different from the original.  Clinical research associate met with patient in the ED today to complete a substance use screening. Patient came to the ED stating, he's homeless living [place] a bridge, has a drug and alcohol problem and hasn't been taking care of himself. Patient is requesting SUD rehab services. Patient reports using fentanyl and alcohol daily, he also reports he is HIV positive and not taking his medication as prescribed. Patient is connected to [facility] and has a CW. Writer contacted the CAT program, patient completed the phone screening and Clinical research associate faxed clinical with patients verbal permission. Patient has been conditionally accepted to their program for tomorrow following medical clearance. Patient is being admitted to the CDU for observation. See [facility] update notes for final POC.            05/10/21 1305   Discharge Plan   Referral Source ED   Insurance [place]   Screening Complete Yes   Race/Ethnicity White   CIWA Yes   COWS No   MAT Education Yes   MAT Referral Yes   Narcan Provided Recommendation for prescription made to MD   SNF/Acute Rehab No   ROIs For CAT   Treatment Follow up CAT   Follow up Appt Date 05/11/21     Electronically signed by [place], Registered Nurse at 05/10/2021  1:19 PM EDT

## 2021-05-10 NOTE — H&P (Signed)
Formatting of this note is different from the original.  Images from the original note were not included.      [facility] History & Physical  [facility]    Patient: Alexander [name] MRN: [id number]  Room/Bed:  28/28   Date of Birth: 05-Jan-1986  Age: 35 year old  Gender: male     Date of Service:  05/10/2021  Primary Care Physician:  Marisa Sprinkles, MD  Admitting Physician:  Virgina Jock, MD    History OF PRESENT ILLNESS     Admissions in past 12 months:       0  Last hospitalization:                         04/28/2020 to 05/01/2020    Chief Complaint:   Alcohol abuse, withdrawal     History of Present Illness  Alexander [name] was seen and examined at the bedside.   The patient is a(n) 35 year old male with history of HIV, hep C, hx opiate abuse, EOTH abuse who presents with alcohol withdrawal. States that he drink 12 24 oz beers daily, will typically develop some tremors if he stops drinking for 12-15 hours. repoprts history of seizures though none currently. Actively until fentanyl as well. Attempted to make arrangements to have patient follow up at [facility] house however no bed available until tomorrow. Patient also developed intractable n/v and concern for possible inabilty to tolerate his benzodiazepines. Wants to quit drinking. No recent fevers, chills, abdominal, pain, constipation, diarrhea. Recently resumed his HIV medications, follows with IDC at [facility]. Reports better adherence recently. Wishes to defer his labs until follow up     Past Medical History  Past Medical History:   Diagnosis Date   ? Hepatitis C    ? HIV (human immunodeficiency virus infection) (HCC)    ? Hypertension      Past Surgical History    Past Surgical History:   Procedure Laterality Date   ? MANDIBLE SURGERY Left 2015     Current Medications  Prior to Admission medications    Medication Sig Start Date End Date Taking? Authorizing Provider   ibuprofen (ADVIL,MOTRIN) 800 MG TABS Take 1 tablet by mouth 3 (three) times daily  as needed. 01/30/21   Lucianne Lei., MD   metoprolol tartrate (LOPRESSOR) 25 MG TABS Take 25 mg by mouth 2 (two) times daily.    HISTORICAL MED   ondansetron (ZOFRAN-ODT) 4 MG TBDP Take 1 tablet by mouth every 8 (eight) hours as needed. 06/17/20   Schindewolf, Bayard Beaver, PA-C   TRIUMEQ 600-50-300 MG TABS Take 1 tablet by mouth daily. 02/21/20   HISTORICAL MED     Allergies  No Known Allergies    Social History  Patient  reports that he has been smoking. He has a 20.00 pack-year smoking history. He has never used smokeless tobacco. He reports current alcohol use of about 24.0 standard drinks of alcohol per week. He reports current drug use. Drugs: Heroin, IV, and Fentanyl.    Family History  Patient's family history includes Hypertension in his mother; Seizures in his father.    ROS: See HPI for further details.   All other systems reviewed and negative.     Pain:  Patient?s current pain level and pain goals reviewed, treatment options discussed.   Pain Care Plan established.    PHYSICAL EXAM     Temp:  [98 F (36.7 C)] 98 F (36.7  C)  Pulse:  [85-122] 85  Resp:  [12-22] 13  BP: (141-194)/(81-130) 141/81  SpO2:  [94 %-100 %] 94 %  O2 Device: None (Room air)  O2 Flow Rate (L/min): --  O2 (%): --  Estimated body mass index is 22.59 kg/m as calculated from the following:    Height as of 04/28/20: 69 (175.3 cm).    Weight as of 04/28/20: 153 lb (69.4 kg).    Physical Exam-   Constitutional:  chronically ill appearing, sunburned   HEENT:  Normocephalic, atraumatic, oropharynx moist, no oral exudates   Neck:  Normal range of motion, no tenderness, supple  Cardiovascular:   Normal heart rate, normal rhythm, no murmurs, no rubs, no gallops  Respiratory:  Normal breath sounds, no respiratory distress, No wheezing  GI:  Bowel sounds normal, soft, no tenderness, no masses   Extremities:  Intact distal pulses, no cyanosis, clubbing or edema  Skin:  Warm and dry  Musculoskeletal:   No tenderness, or deformity.   Neurologic:   Normal motor function, normal sensory function, no focal deficits noted.  Psychiatric: Benign affect, normal mood.    Labs/Cultures:   Recent Results (from the past 24 hour(s))   BAMP (Na,K,Cl,CO2,Glu,BUN,Creat,Ca)    Collection Time: 05/10/21  5:52 AM   Result Value Ref Range    BLD UREA NITROGEN 5 (L) 8 - 26 mg/dL    SODIUM 119 147 - 829 mEq/L    POTASSIUM 3.9 3.6 - 5.1 mEq/L    CHLORIDE 98 98 - 111 mEq/L    CO2 26 21 - 31 mmol/L    GLUCOSE, RANDOM 99 70 - 99 mg/dL    CREATININE 5.62 (L) 0.70 - 1.30 mg/dL    ANION GAP 11 6 - 18 mmol/L    CALCIUM 8.7 8.5 - 10.4 mg/dL    ESTIMATED GFR 130 >86 mL/min/1.73 m2   CBC with Differential    Collection Time: 05/10/21  5:52 AM   Result Value Ref Range    WBC 4.8 3.6 - 10.5 THOU/mcL    RBC 5.00 4.40 - 5.80 MIL/mcL    HEMOGLOBIN 14.3 13.5 - 16.5 g/dL    HEMATOCRIT 57.8 40 - 50 %    MCV 85.3 82 - 97 fL    MCH 28.5 27 - 33 pg    MCHC 33.5 32 - 36 g/dL    RDW 46.9 62.9 - 52.8 %    PLATELET 113 (L) 140 - 375 THOU/mcL    MPV 9.9 7.4 - 11.5 fL    ABS. NEUTROPHIL 2.30 1.80 - 7.70 THOU/mcL    ABS LYMPHS 1.60 1.00 - 4.00 THOU/mcL    ABS MONOS 0.70 0.20 - 0.90 THOU/mcL    ABS EOS 0.10 0.03 - 0.45 THOU/mcL    ABS BASOS 0.00 0.00 - 0.20 THOU/mcL    SEGS 48 %    LYMPHOCYTES 34 %    MONOCYTES 15 %    EOSINOPHIL 2 %    BASOPHILS 1 %   Ethanol Level    Collection Time: 05/10/21  5:52 AM   Result Value Ref Range    ETHANOL, SERUM 276 (H) <10.0 mg/dL   Hepatic Function Panel (TBIL,DBIL,ALK,AST,ALT,TP,ALB)    Collection Time: 05/10/21  5:52 AM   Result Value Ref Range    ALK PHOSPHATASE 132 35 - 135 IU/L    TOTAL PROTEIN 8.4 (H) 6.0 - 8.0 g/dL    ALBUMIN 4.1 3.5 - 5.7 g/dL    AST 413 (H) 10 - 40 IU/L  ALT 80 (H) 10 - 60 IU/L    TOTAL BILIRUBIN 0.6 0.0 - 1.2 mg/dL    DIRECT BILIRUBIN 0.2 0.0 - 0.2 mg/dL   Magnesium Level    Collection Time: 05/10/21  5:52 AM   Result Value Ref Range    MAGNESIUM 1.9 1.7 - 2.4 mg/dL   ECG 12 lead    Collection Time: 05/10/21  9:55 AM   Result Value Ref Range     Height  in    HEART RATE 80 bpm    RR INTERVAL 750 ms    ATRIAL RATE 87 ms    P-R Interval 114 ms    P Duration 99 ms    P HORIZONTAL -26 deg    P FRONT AXIS 74 deg    Q Onset 499 ms    QRSD Interval 92 ms    QT INTERVAL 384 ms    QTCB 443 ms    QTcF 423 ms    QRS Horizontal Axis -25 deg    QRS AXIS 50 deg    I-40 Horizontal Axis 51 deg    I-40 FRONT AXIS 67 deg    T-40 Horizontal Axis -47 deg    T-40 Front Axis 43 deg    T Horizontal Axis 37 deg    T WAVE AXIS -4 deg    S-T Horizontal Axis 72 deg    S-T Front Axis -19 deg    ECG IMPRESSION - NORMAL ECG -     ECG IMPRESSION SR-Sinus rhythm-normal P axis, V-rate 50-99     ECGGUID [id number]      Imaging & OTHER STUDIES     XR PANOREX    Result Date: 05/10/2021   Persistent visualization of nondisplaced fractures right angle and left body of mandible     I have reviewed the results and note nondidplaced fx     EKG reviewed as NSR without acute ischemic ch ange     Assessment     Active Problems:    Alcohol abuse    Opiate abuse, continuous (HCC)    Alcohol withdrawal (HCC)    HIV (human immunodeficiency virus infection) (HCC)    PLAN     Alcohol dependence with withdrawal - given his history of significant ETOH use will order scheduled valium with ativan PRN ciwa score. Continue MVI, thiamine. Plan to transfer for [facility] house tomorrow     Opiate dependence - stable currently, continue valium as above. Monitor for opiate withdrawals, consider addition of suboxone if worsening symptoms.     HIV - reports adherence though suspect he does have some degree of noncompliance as well. Wants to follow up for bloodwork with [facility]     transaminitis - 2/2 alcohol abuse, monitor LFTs     N/v - zofran PRN, bentyl PRN     DVT prophylaxis:  Intermediate to High risk on  Heparin SC        Electronically signed by: Virgina Jock, MD, 05/10/2021 11:52 AM    Please note that some or all of this record was generated using voice recognition software. If there are  any questions about the content of this document, please contact the author as some errors of transcription may have occurred.    Electronically signed by Virgina Jock., MD at 05/10/2021 11:56 AM EDT
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
## 2021-05-10 NOTE — ED Notes (Signed)
Formatting of this note might be different from the original.  Patient bent over sink in room, gagging and coughing, spitting in sink.  Asked patient to return to bed for piv access and medication.  Patient stated I will in a few minutes, when I'm done.  This is every morning.    Electronically signed by Mare Loan, Registered Nurse at 05/10/2021  5:31 AM EDT
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
## 2021-05-10 NOTE — Assessment & Plan Note (Signed)
Formatting of this note is different from the original.  Discussed in Interdisciplinary Rounds.  Patient requesting help with his drug and alcohol abuse and is homeless.  [facility] has seen already and has facilitated patient being accepted into the [facility].  They will have a bed tomorrow.  Patient currently having withdrawal symptoms so is staying overnight.  CM to coordinate transportation with Michelle Piper tomorrow.    Patient admitted with   1. Alcohol intoxication, continuous, uncomplicated (HCC)    2. Homelessness unspecified    3. Intractable nausea and vomiting        Initial Care Management Assessment   Performed by Ronny Flurry, Registered Nurse Phone: [phone]    Anticipated Discharge Needs: drug and alcohol rehab program   Number of steps to enter home: n/a    Home Care Options Discussed with Patient/Family: n/a   Need for toileting asked to patient: n/a  Potential Discharge Barriers: able to tolerate po, N/V control, tremors  Primary family/friend contact & phone number:  Patient Contacts     Name Relation Home Work Mobile    [name] Sister   [phone]       Preferred pharmacy:  [facility] [place], [place] [address]  [phone]  Confirmed patient's preferred pharmacy: Yes  Care Management handouts provided:   CM Brochure:  No  MOON letter if applicable:    IML if applicable:      CM or SW will follow: Ronny Flurry, Registered Nurse    Flowsheet assessment:  Care Manager Psychosocial Assessment    Date: 05/10/2021    Patient Name: Alexander [name] Date of Birth: 20-Jan-1986  Primary Care Physician: Oneita Hurt, None   Attending: Virgina Jock., MD    Info provided by: Chart  PCP Verified?: No    Prior to Admission:  Type of Home: Homeless  Has Patient Resided in [facility] [place], [place], Nursing Home or Hospital in Past 3 Months?: Yes  Level of Care:  (hospital)  Level of Independence: Independent  Active with Home Care: No  Current DME: No  Active with Community Agency:  Yes  Provider 1: [facility] for D&A    Current:  Current Level of Independence: Independent  Level of Consciousness: Alert  Orientation Level: Oriented X4  Cognition: Follows commands    Discharge:  Who is requesting discharge planning?: Initial CM Assessment  Who will be picking you up at discharge?: Other (comment) (taxi)  Anticipated DC Plan:  (INPATIENT DRUG REHAB CAT PROGRAM)  Expected Discharge Date: 05/11/21  Follow up appointment: N/A - Alternate care facility      Electronically signed by Ronny Flurry, Registered Nurse at 05/10/2021  4:49 PM EDT

## 2021-05-10 NOTE — ED Provider Notes (Signed)
Formatting of this note is different from the original.  [facility]  ED Encounter Arrival Date: 05/10/21 0450  Eward Helou                            DOB: 1986/06/05  [facility]  [place] [address]   MRN: [id number]   CSN: [id number]     HAR: [id number]     EMERGENCY DEPARTMENT - ATTENDING NOTE    I have personally seen and examined this patient. I have fully participated in the care of this patient. I have reviewed and agree with all pertinent clinical information including history, physical exam, labs, radiographic studies and the plan. I have also reviewed and agree with the medications, allergies and past medical history sections for this patient.    HPI: Patient presented with a myriad of complaints likely homeless here with numerous issues and concerns needs rehab his jaws hurting from a fracture that he suffered earlier in the month.  He states his last drink was about 6 or 7 hours ago his last use of fentanyl was that time as well.    Review of Systems: See level note for further details. All other review of systems otherwise negative    Vital Signs: BP 154/84 (Patient Position: Semi-Fowlers)   Pulse 85   Temp 98.4 F (36.9 C) (Oral)   Resp 24   SpO2 99%   General: Patient appears non-toxic slightly anxious slightly agitated  HENT: Atraumatic, normocephalic, oral mucosa moist PERRL, conjunctiva normal  Lungs:  Clear bilaterally  Heart: Regular rate and rhythm  Abdomen: Non-distended, soft, non-tender  Extremities: No edema extremities exam shows Intact distal pulses, No edema, No tenderness, No cyanosis, No clubbing.  Back nontender the concerned area that he points to appears to be a spinous process  Neuro: Alert & oriented x 3, CN 2-12 normal, normal motor function, normal sensory function, no focal deficits noted although patient states he cannot walk he ambulated without difficulty.    Medical Decision Making and Plan:  Pertinent Labs & Imaging  studies reviewed. (See chart for details)  Patient presents with multiple myriad of complaints.  He is usually got polysubstance abuse.  He is homeless.  Frequently gets agitated he has been admitted sometimes for alcohol withdrawal other times he has been referred out to outpatient treatment.  He will undergo laboratory work-up here disposition pending that work-up and his response to symptoms he was given Zofran for his nausea x-ray of the mandible was obtained and given a rally pack.    He will be assessed by the substance abuse counselor.  He might require admission although he has been offered admission numerous times in the past and has signed out AMA.  Final disposition plan pending work-up.    The patient was given the following medication in the Emergency department:    Medication Administration from 05/10/2021 0450 to 05/10/2021 1228     Date/Time Order Dose Route Action Action by Comments    05/10/2021 0603 folic acid 1 mg, thiamine (B-1) 100 mg in sodium chloride 0.9 % 1,000 mL IVPB   Intravenous New Bag Mare Loan     05/10/2021 8413 folic acid 1 mg, thiamine (B-1) 100 mg in sodium chloride 0.9 % 1,000 mL IVPB   Intravenous Paused Mare Loan     05/10/2021 2440 folic acid 1 mg, thiamine (B-1) 100 mg in sodium chloride 0.9 % 1,000 mL IVPB  Intravenous Restarted Mare Loan     05/10/2021 0803 folic acid 1 mg, thiamine (B-1) 100 mg in sodium chloride 0.9 % 1,000 mL IVPB 0  Intravenous Completed Richardo Priest     05/10/2021 0853 multivitamin w/ minerals (THERA-M) tablet 1 tablet 1 tablet Oral Given Renzo Stephenson missing med    05/10/2021 0600 ondansetron (ZOFRAN) injection 4 mg 4 mg Intravenous Given Mare Loan     05/10/2021 0629 ketorolac (TORADOL) injection 15 mg 15 mg Intravenous Given Mare Loan     05/10/2021 0853 LORazepam (ATIVAN) tablet 1 mg 1 mg Oral Given Richardo Priest     05/10/2021 0941 metoclopramide (REGLAN) injection 10 mg 10 mg Intravenous Given Eber Jones         FINAL DIAGNOSIS:    1. Alcohol intoxication, continuous, uncomplicated (HCC)    2. Homelessness unspecified    3. Intractable nausea and vomiting        Shirlee Latch, MD  05/10/21 2239    Electronically signed by Shirlee Latch, MD at 05/10/2021 10:39 PM EDT

## 2021-05-10 NOTE — Assessment & Plan Note (Signed)
Formatting of this note might be different from the original.  Asked by MD to provide pt some homeless resource information. Resource information given to patient.  Electronically signed by Jenkins [place], Registered Nurse at 05/10/2021  7:41 AM EDT

## 2021-05-11 NOTE — Assessment & Plan Note (Signed)
Formatting of this note might be different from the original.  Patient ready for discharge.  He was transported to [facility] where [place], at front desk, will call medical uber to transport patient to [facility] [address] for rehab.    Electronically signed by Ronny Flurry, Registered Nurse at 05/11/2021  1:46 PM EDT

## 2021-05-11 NOTE — Progress Notes (Signed)
Formatting of this note might be different from the original.  For your records, the bill status(es) for Alexander [name] during their hospital stay is as follows:  05/10/2021 0458  Admission Emergency  05/10/2021 0930  Patient Update Observation  05/11/2021 1413  Discharge Observation     :    Any questions regarding PATIENT CLASS/STATUS should be directed to the Care Management Departments:  [facility] [phone] or [facility] [phone] or [facility] [phone].  Electronically signed by Ebbie Latus, MD at 05/12/2021  7:41 AM EDT

## 2021-05-11 NOTE — Assessment & Plan Note (Signed)
Formatting of this note might be different from the original.  [facility] discharge note. Patient is being at 1:30 pm today to be transferred via [facility] to the [facility] program @ [address] [place]. [address] for detox services. No further actions are required at this time.      Electronically signed by [place], Registered Nurse at 05/11/2021 11:04 AM EDT

## 2021-05-11 NOTE — Discharge Summary (Signed)
Formatting of this note is different from the original.  Images from the original note were not included.      [facility] discharge summary  [facility]    Patient: Alexander [name] MRN: [id number]  Room/Bed:  51A/01   Date of Birth: [birth year]  Age: 35 year old  Gender: male      Admit Date:  05/10/2021  Discharge Date:  05/11/2021    Code Status:  Full Code  Primary Care Physician  Pcp, None, MD  Attending Physician  Virgina Jock., MD    Discharge Diagnoses: Active Problems:    Alcohol abuse POA: Yes    Opiate abuse, continuous (HCC) POA: Yes    Alcohol withdrawal (HCC) POA: Yes    HIV (human immunodeficiency virus infection) (HCC) POA: Yes  Resolved Problems:    * No resolved hospital problems. [facility] Course:  The patient is a(n) 35 year old male with history of opiate and alcohol dependence, HIV who was admitted for alcohol and opiate withdrawals. Started on scheduled valium PO with PRN ativan per ciwa score as well as medications for opiate withdrawal via [facility] orderset. Substance abuse counseling met with patient and arrangements made for transfer to [facility] on discharge. Stable for transfer there at this time. Patient to follow up with Dr. Manson Passey at [facility] for his HIV management in 1-2 weeks.     At time of discharge the patient was hemodynamically stable, tolerating PO and ambulating unassisted with no complaints of pain. The discharge medication list was reviewed and all questions were answered. The patient was given strict return precautions and voiced clear understanding.          Disposition:  substance abuse treatment                    Discharge Medications:       Medication List     START taking these medications    folic acid 1 MG Tabs  Commonly known as: FOLVITE  Take 1 tablet by mouth daily.  Start taking on: May 12, 2021    hydrOXYzine Pamoate 25 mg capsule  Commonly known as: VISTARIL  Take 1-2 capsules by mouth every 8 (eight) hours as needed.    multivitamin w/  minerals Tabs  Take 1 tablet by mouth daily.  Start taking on: May 12, 2021      CONTINUE taking these medications    metoprolol tartrate 25 MG Tabs  Commonly known as: LOPRESSOR    ondansetron 4 MG Tbdp  Commonly known as: ZOFRAN-ODT  Take 1 tablet by mouth every 8 (eight) hours as needed.    Triumeq 600-50-300 MG Tabs  Generic drug: abacavir-dolutegravir-lamiVUDine      STOP taking these medications    ibuprofen 800 MG Tabs  Commonly known as: ADVIL,MOTRIN        Where to Get Your Medications     These medications were sent to [facility] - [place], [place] - [address]  [address], [place] [address]    Hours: 9am-5:30pm Mon-Fri, 9am-12:30pm Sat Phone: [phone]    folic acid 1 MG Tabs   hydrOXYzine Pamoate 25 mg capsule   multivitamin w/ minerals Tabs      Medication issues:   Continue with folate, MVI, thiamine, atarax PRN. Continue home HIV medications     Significant Labs:     H/h stable, creatinine wnl, electrolytes stable     Discharge Pain  Management Plan: Patient?s discharge pain status is pain is adequately controlled. Discharge plan for pain control discussed with  patient.   Discharge pain management includes non-pharmacological pain reduction interventions, medications to be taken as instructed, and to follow up with PCP for any changes.     Pending Tests/Issues:    Pending Results (From admission, onward)     Start        05/10/21 1236  URINE DRUG SCREEN 9  Upon admission         05/10/21 1610  Drugs of Abuse 9 (THC,COC,OP,AMP,BAR,PCP,BZO,MEDN), Urine  STAT           05/10/21 0512  Urinalysis with Reflex to Microscopic and Culture  STAT                  Patient has been informed that there are results pending as above.  They are aware that they will be informed of any critical value, but should follow up with their PCP to review results.    Discharge Follow up/ Next Steps:   [facility]  [address]  [place]  [place] [address]  [phone]  In 3 days    Pcp, None, MD  [facility]  [phone]    Curly Rim, MD  [address]  [place] [address]  [phone]    Follow up in 1 week(s)    Consults:    IP CONSULT TO CHEMICAL DEPENDENCY  IP CONSULT TO CHEMICAL DEPENDENCY  IP CONSULT TO CHEMICAL DEPENDENCY    Procedures:  XR PANOREX    Result Date: 05/10/2021   Persistent visualization of nondisplaced fractures right angle and left body of mandible     Physical Exam:    BP 155/81 (Patient Position: Semi-Fowlers)   Pulse 68   Temp 98.1 F (36.7 C) (Oral)   Resp 26   SpO2 100%    The patient was seen and examined on the day of discharge.  Exam was notable for chronically ill appearing, disheveled male in no distress. Lungs ctab, cardiac exam with RRR no MRG. Abdomen soft, nt, nd. No tremors.     Condition at Discharge: Stable and Improved.    Total time on discharge and discharge planning was 30 minutes.    Electronically signed by: Virgina Jock, MD, 05/11/2021 11:48 AM    Please note that some or all of this record was generated using voice recognition software. If there are any questions about the content of this document, please contact the author as some errors of transcription may have occurred.    Electronically signed by Virgina Jock., MD at 05/11/2021 11:51 AM EDT

## 2021-05-11 NOTE — Assessment & Plan Note (Signed)
Formatting of this note is different from the original.  Discussed in Interdisciplinary Rounds patient under observation for drug and alcohol abuse seeking help.  Patient is also homeless.  [facility] following for inpatient D&A Rehab placement, [facility] likely today.  CM will contact Michelle Piper for details of disposition and transportation.    ANTICIPATED DISCHARGE PLAN  ? Discharge Plan:  (INPATIENT DRUG REHAB CAT PROGRAM)  ? Home Care/DME/LTAC (Company, Name of person you spoke with, phone, fax): n/a  ? Home Care Options Discussed with Patient/Family: n/a  ? Need for toileting asked to patient: n/a CM not in room  ? Barriers to Discharge: bed availability, CIWA management  ? Anticipated Discharge Transportation: taxi/uber  ? IML if applicable:    ? MOON letter if applicable:    ? Patient agrees to plan: Yes  ? CM following today (name and phone#): Ronny Flurry, Registered [phone]    Electronically signed by Ronny Flurry, Registered Nurse at 05/11/2021  1:47 PM EDT

## 2021-05-11 NOTE — Progress Notes (Signed)
Formatting of this note might be different from the original.  OBSERVATION status was ordered by Admitting Physician.     :    Any questions regarding PATIENT CLASS/STATUS should be directed to the Care Management Departments:  [facility] [phone] or [facility] [phone] or [facility]  [phone].  Electronically signed by Ebbie Latus, MD at 05/11/2021  7:56 AM EDT

## 2021-05-11 NOTE — Nursing Note (Signed)
Formatting of this note might be different from the original.  [facility] discharged to [facility] by [facility]er provided by care management. Pt sent with all belongings including clothes and medicine (HIV medications). RN checked with [facility] before sending pt to make sure that everything was ready for his arrival. Staff at [facility] said everything was ready. Pt's IV removed before leaving.     Electronically signed by Beverley Fiedler, Registered Nurse at 05/11/2021  6:08 PM EDT

## 2022-04-06 MED ORDER — TRIUMEQ 600-50-300 mg Tab
600-50-300 | ORAL_TABLET | ORAL | 1 refills | Status: AC
Start: 2022-04-06 — End: ?

## 2022-04-06 NOTE — Unmapped (Signed)
Please schedule Alexander [name] for follow-up with me in ~1 month.

## 2022-04-07 NOTE — Unmapped (Signed)
Patient is homeless and we have no way of contacting. Perhaps [facility] if he is with them but I don't have any way to find out.

## 2022-04-07 NOTE — Unmapped (Signed)
No number listed on file for Alexander [name], boquet to schedule a follow-up appointment with Dr Manson Passey. On 02/19/21 pt requested a call back on [phone] ~ wrong number.

## 2022-05-01 NOTE — H&P (Signed)
Formatting of this note is different from the original.  Images from the original note were not included.      [facility]    Hospitalist Admission history and Physical    Patient Name:  Alexander [name]    DOB:  1986/02/07    MEDICAL RECORD NUMBER [id number]    Date of Service  05/01/2022    Primary Care Physician  Pcp, None, MD    Attending Physician  Dessie Coma Sotos, MD    CHIEF COMPLAINT  Finger pain    History of Present illness  The patient is a 36 year old male with HIV noncompliant with medical therapy, HCV, alcoholism and IVDA.  He is homeless and [place].  He thinks that something bit his left index finger a few days ago.  Subsequently the finger has become swollen and painful.  Now has decreased range of motion.  He endorses fever and chills.  He admits to fentanyl IVDA but states he does not inject in his hand.  He does not have any other active skin infections but reports patches of rash and excoriation on the lower abdomen, right thigh and distal left leg.  Reportedly he drinks 1 case of beer per day with occasional liquor.  He smokes cigars and uses fentanyl.  He has a history of DTs with hallucinations and alcohol withdrawal seizures.  He is worried that he is going to have severe alcohol withdrawal.  He has not been compliant with his HIV medication.  Allegedly he started taking Triumeq again 2 weeks ago.    Past Medical History  Past Medical History:   Diagnosis Date   ? Hepatitis C    ? HIV (human immunodeficiency virus infection) (HCC)    ? Hypertension    Alcoholism with history of DTs and alcohol withdrawal seizure  Polysubstance abuse including fentanyl IVDA    Past Surgical History  Past Surgical History:   Procedure Laterality Date   ? MANDIBLE SURGERY Left 2015     Allergies   No Known Allergies    Current Medications  Prior to Admission medications    Medication Sig Start Date End Date Taking? Authorizing Provider    hydrOXYzine Pamoate (VISTARIL) 25 mg capsule Take 1-2 capsules by mouth every 8 (eight) hours as needed. 05/11/21   Virgina Jock., MD   folic acid (FOLVITE) 1 MG TABS Take 1 tablet by mouth daily. 05/12/21   Virgina Jock., MD   multivitamin w/ minerals (THERA-M) TABS Take 1 tablet by mouth daily. 05/12/21   Virgina Jock., MD   metoprolol tartrate (LOPRESSOR) 25 MG TABS Take 25 mg by mouth 2 (two) times daily.    HISTORICAL MED   ondansetron (ZOFRAN-ODT) 4 MG TBDP Take 1 tablet by mouth every 8 (eight) hours as needed. 06/17/20   Schindewolf, Bayard Beaver, PA-C   TRIUMEQ 600-50-300 MG TABS Take 1 tablet by mouth daily. 02/21/20   HISTORICAL MED     Social History   reports that he has been smoking cigarettes. He has a 20.00 pack-year smoking history. He has never used smokeless tobacco. He reports current alcohol use of about 24.0 standard drinks of alcohol per week. He reports current drug use. Drugs: Heroin, IV, and Fentanyl.      Family History  Family History   Problem Relation Age of Onset   ? Hypertension Mother    ? Seizures Father  Review of Systems  As per HPI otherwise reviewed and negative     Physical Exam  Vital Signs:  Temp:  [98.3 F (36.8 C)-98.4 F (36.9 C)] 98.4 F (36.9 C)  Pulse:  [100-116] 107  Resp:  [17-20] 20  BP: (128-150)/(82-102) 148/84  Constitutional: Caucasian male, stated age, thin habitus.  He has poor hygiene and is malodorous  HENT:  Normocephalic, Atraumatic, membranes moist, poor dental hygiene  Eyes:  PERRL, EOMI, Conjunctiva normal, No discharge.   Lymphatic:  No cervical nor supraclavicular lymphadenopathy   Cardiovascular:  Tachycardic no murmur  Respiratory:   No respiratory distress. Normal breath sounds.  Sounds were clear  GI:  Soft, nontender, nondistended. (+) bowel sounds.   Integument:  Warm, dry.  Red rash on the lower abdomen and there is an area of excoriation on the distal left shin.  He reports something similar on the proximal right thigh but I  did not see this  Extremities:  Intact distal pulses. No pedal edema.  Fingernails are filthy  Track marks on his arms  Left index finger is swollen with erythema and there is decreased range of motion  Musculoskeletal: Good range of motion in all major joints.   Neurologic:  Alert & oriented x 3. Cognition appropriate.  Speech fluent. CN grossly intact. No focal deficits.  Psychiatric:  Speech and behavior appropriate    Labs - i reviewed the labs  Recent Results (from the past 24 hour(s))   BAMP (Na,K,Cl,CO2,Glu,BUN,Creat,Ca)    Collection Time: 05/01/22  1:51 AM   Result Value Ref Range    BLD UREA NITROGEN 7 (L) 8 - 26 mg/dL    SODIUM 782 (L) 956 - 145 mEq/L    POTASSIUM 4.2 3.6 - 5.1 mEq/L    CHLORIDE 99 98 - 111 mEq/L    CO2 26 21 - 31 mmol/L    GLUCOSE, RANDOM 93 70 - 99 mg/dL    CREATININE 2.13 (L) 0.70 - 1.30 mg/dL    ANION GAP 8 4 - 16 mmol/L    CALCIUM 8.4 (L) 8.5 - 10.4 mg/dL    ESTIMATED GFR 086 >57 mL/min/1.73 m2   CBC w/ Diff    Collection Time: 05/01/22  1:51 AM   Result Value Ref Range    WBC 9.3 3.6 - 10.5 THOU/mcL    RBC 4.79 4.40 - 5.80 MIL/mcL    HEMOGLOBIN 9.8 (L) 13.5 - 16.5 g/dL    HEMATOCRIT 84.6 (L) 40 - 50 %    MCV 65.1 (L) 82 - 97 fL    MCH 20.5 (L) 27 - 33 pg    MCHC 31.5 (L) 32 - 36 g/dL    RDW 96.2 (H) 95.2 - 17.0 %    PLATELET 109 (L) 140 - 375 THOU/mcL    MPV 8.5 7.4 - 11.5 fL    ABS. NEUTROPHIL 7.20 1.80 - 7.70 THOU/mcL    ABS LYMPHS 1.00 1.00 - 4.00 THOU/mcL    ABS MONOS 1.00 (H) 0.20 - 0.90 THOU/mcL    ABS EOS 0.00 (L) 0.03 - 0.45 THOU/mcL    ABS BASOS 0.00 0.00 - 0.20 THOU/mcL    SEGS 78 %    LYMPHOCYTES 11 %    MONOCYTES 11 %    EOSINOPHIL 0 %    BASOPHILS 0 %   C-Reactive Protein    Collection Time: 05/01/22  1:51 AM   Result Value Ref Range    C-REACTIVE PROTEIN 31.00 (H) <5.0 mg/L   Culture Blood  Adult - First Set    Collection Time: 05/01/22  1:51 AM    Specimen: Blood (Peripheral Site 1)   Result Value Ref Range    SPECIMEN DESCRIPTION BLOOD (SITE 1 PERIPHERAL)     SPECIMEN  TYPE Blood     CULTURE RESULTS       This culture has been examined but additional incubation is required.    MICRONOTE       Tested at [facility] [address] [phone]    REPORT STATUS PENDING    Initial Lactate (Lactic Acid)    Collection Time: 05/01/22  1:51 AM   Result Value Ref Range    LACTATE, INITIAL 1.4 0.5 - 2.0 mmol/L   Culture Blood Adult - Second Set    Collection Time: 05/01/22  2:29 AM    Specimen: Blood (Peripheral Site 2)   Result Value Ref Range    SPECIMEN DESCRIPTION BLOOD (SITE 2 PERIPHERAL)     SPECIMEN TYPE Blood     CULTURE RESULTS       This culture has been examined but additional incubation is required.    MICRONOTE       Tested at [facility] [address] [facility] [phone]    REPORT STATUS PENDING    URINE DRUG SCREEN 9    Collection Time: 05/01/22  5:01 AM   Result Value Ref Range    PCP NEGATIVE NEGATIVE    BENZODIAZEPINES NEGATIVE NEGATIVE    COCAINE POSITIVE (A) NEGATIVE    AMPHETAMINES NEGATIVE NEGATIVE    TETRAHYDROCANNIBINOL NEGATIVE NEGATIVE    OPIATES POSITIVE (A) NEGATIVE    BARBITURATES NEGATIVE NEGATIVE    METHADONE NEGATIVE NEGATIVE    OXYCODONE NEGATIVE NEGATIVE    CREATININE URINE 24.3 mg/dL   ETHANOL,SERUM    Collection Time: 05/01/22  5:36 AM   Result Value Ref Range    ETHANOL, SERUM 99 (H) <10.0 mg/dL     Patient Lines/Drains/Airways Status     Active LINES/DRAINS/AIRWAYS     Name Placement date Placement time Site Days    Peripheral IV 05/01/22 22 G Anterior;Right Forearm 05/01/22  0200  Forearm  less than 1           IMAGING STUDIES & OTHER STUDIES  XR HAND LEFT PA LATERAL AND OBLIQUE    Result Date: 05/01/2022   Marked soft tissue swelling of the index finger with no underlying evidence of osteomyelitis or radiopaque foreign body.        Assessment & Plan    Left index finger cellulitis with concern for tenosynovitis  -The ED provider spoke with Dr. Leda Quail who will evaluate the patient this morning  -Blood cultures in process  -Empiric Zosyn and  vancomycin    HIV  -Has been noncompliant with his medication.  Resume Triumeq  -Check CD4 count  -ID consult    Polysubstance abuse including fentanyl IVDA, cocaine and alcohol  Alcoholism with history of DTs including seizures  -CIWA with scheduled Valium  -He is having pain and wants to receive narcotics for his pain.  For this reason not placed on an opiate withdrawal protocol    He is homeless    DVT prophylaxis  -lovenox    Full Code    I anticipate the patient will be in the hospital for at least two midnights.     Total time on patient evaluation and management was 35 minutes.    The plan was discussed with the patient and/or family.  Electronically signed by: Dessie Coma Sotos, MD, 05/01/2022 7:11 AM     Please refer to Treatment Team for active Hospitalist and contact number.    Electronically signed by Sotos, Dessie Coma, MD at 05/01/2022  7:22 AM EDT

## 2022-05-01 NOTE — Progress Notes (Signed)
Formatting of this note might be different from the original.  Pharmacy note re:  Vancomycin Initial Consult    S:   Pharmacy asked to dose vancomycin by Dr. Melany Guernsey for treatment of cellulitis.    O:   Dosing Weight = 140 lb 6.4 oz (63.7 kg)         Estimated Creatinine Clearance: 118 mL/min (A) (by C-G formula based on SCr of 0.59 mg/dL (L)).   Doses received in preceding 24 hours: One-time ED dose of 1000mg  given (date) 05/01/22 at (time) 0504    A:   Patient qualifies for AUC:MIC dosing methodology.   Indication/severity of infection does not warrant use of a loading dose.              Vanc AUC Goal: 400.    P:   Initial maintenance dose of  1000 mg every 12 hours will be ordered based on current renal function.               After first dose, will obtain random serum concentrations Vanc AUC Times Around Doses: 5 & 9 hours after the 3rd dose to validate maintenance regimen.    Will assess two-point kinetics when results are available.  No change to dosing regimen until otherwise specified.   Will continue to follow and make adjustments to vancomycin therapy as necessary.    Camelia Eng, RPh  05/01/2022  Electronically signed by Camelia Eng, RPh at 05/01/2022  5:22 AM EDT

## 2022-05-01 NOTE — Nursing Note (Signed)
Formatting of this note might be different from the original.  Pt returned to room 8143. VSS on 2 L NC. Slightly elevated BP 145/91. Satting at 100%. Keeping o2 on due to drowsiness. Pt is arousable but quickly falls back asleep. New left index finger incision UTA due to being wrapped from fingers to wrist with ace/kerlex. Brisk cap refill. Pt resting comfortably in bed. Bed alarm activated until pt can ambulate independently safely again. Bed left in lowest, locked position with call light in reach.   Electronically signed by Adrian Prows, Registered Nurse at 05/01/2022 11:52 AM EDT

## 2022-05-01 NOTE — Consults (Signed)
Associated Order(s): Inpatient consult to Infectious Diseases  Formatting of this note is different from the original.  Images from the original note were not included.    Infectious diseases consult note  [facility]     Patient: Alexander [name] MRN: [id number]  Room/Bed:  GSPS/PSPB   Date of Birth: [birth year]  Age: 36 year old  Gender: male      Date of Service:  05/01/2022  Admitting Physician: Alexander [name]     Attending Physician: [name], Alexander Coma, MD  Primary Care Physician:  Alexander Sprinkles, MD    Date of Admission:  05/01/2022  Hospital length of stay:  0    Reason for Consult:   Alexander [name] is being seen at the request of Alexander [name] for hiv, tenosynovitis    Inpatient consult to Infectious Diseases  Consult performed by: Alexander Patricia, MD  Consult ordered by: [name], Alexander Coma, MD    Chief Complaint: hiv, tenosynovitis    History of Present Illness: Alexander [name] is a 36 year old male HIV noncompliant with ART, HCV, polysubstance abuse, IVDA, and homelessness currently [place] the woods who presented to the hospital for finger pain.  Patient thinks that a spider bit his left index finger a few days ago.  Subsequently, his finger came swollen and painful with decreased range of motion.  Associated symptoms include fever and chills.  He does have a history of IVDA and he injects fentanyl but denies injecting in the hands.  Patient underwent I&D with Ortho on 5/27.  History from the patient is limited to chart review and information from nursing staff as he was sleepy.    Past Medical History:   Diagnosis Date   ? Hepatitis C    ? HIV (human immunodeficiency virus infection) (HCC)    ? Hypertension      Past Surgical History:   Procedure Laterality Date   ? MANDIBLE SURGERY Left 2015     Social History     Tobacco Use   ? Smoking status: Every Day     Packs/day: 1.00     Years: 20.00     Total pack years: 20.00     Types: Cigarettes   ? Smokeless  tobacco: Never   Substance Use Topics   ? Alcohol use: Yes     Alcohol/week: 24.0 standard drinks of alcohol     Types: 24 Cans of beer per week     Comment: DAILY       Family History   Problem Relation Age of Onset   ? Hypertension Mother    ? Seizures Father      No Known Allergies    Medications:    Scheduled Medications:   ? enoxaparin  40 mg Subcutaneous Daily   ? diazePAM  10 mg Oral TID   ? piperacillin-tazobactam  3.375 g Intravenous Q8H   ? [START ON 05/02/2022] thiamine  100 mg Oral Daily   ? multivitamin w/ minerals  1 tablet Oral Daily   ? folic acid  1 mg Oral Daily   ? vancomycin  15 mg/kg Intravenous Q12H   ? vancomycin RX DOSED   Intravenous MAR PLACE HOLDER   ? insulin aspart  0-12 Units Subcutaneous Once     PRN Medications:  sodium chloride (PF), sodium chloride, calcium carbonate, sennosides-docusate, polyethylene glycol, melatonin, benzonatate, sodium chloride 0.65%, nystatin, ondansetron, HYDROmorphone, oxyCODONE, LORazepam **OR** LORazepam **OR** LORazepam, meperidine, ondansetron, promethazine, Elequil Aromatab -  ORANGE-PEPPERMINT, mineral oil, HYDROmorphone    Continuous Infusions:  ? sodium chloride 100 mL/hr (05/01/22 0742)   ? lactated ringers         Review of Systems   Unable to perform ROS: Mental status change     Pain Management: Pain Management per Attending Provider.      PHYSICAL EXAM     Vitals:   Temp:  [98.3 F (36.8 C)-98.4 F (36.9 C)] 98.4 F (36.9 C)  Pulse:  [100-116] 107  Resp:  [17-20] 20  BP: (128-150)/(82-102) 148/84  SpO2:  [97 %-100 %] 98 %  O2 Device: None (Room air)  O2 Flow Rate (L/min): --  O2 (%): --     Physical Exam   Constitutional: He appears well-developed. No distress.   HENT:   Head: Normocephalic and atraumatic.   Cardiovascular: Normal rate and regular rhythm.   No murmur heard.  Pulmonary/Chest: Effort normal and breath sounds normal. No respiratory distress.   Abdominal: He exhibits no distension.   Musculoskeletal:      Comments: Track marks  noted.  Left hand wrapped with clean dressing.     Neurological:   Sleeping in bed comfortably.   Skin: No rash noted. He is diaphoretic.   No Osler nodes or Janeway lesions.     Patient Lines/Drains/Airways Status     Active LINES/DRAINS/AIRWAYS     Name Placement date Placement time Site Days    Anesthesia LMA 05/01/22  0748  -- less than 1           Labs/Cultures:  Recent Labs     05/01/22  0151   HCT 31.2*   HGB 9.8*   MCV 65.1*   PLT 109*   WBC 9.3     Recent Labs     05/01/22  0151   AGAP 8   BUN 7*   CA 8.4*   CL 99   CO2 26   CRET 0.59*   GLU 93   K 4.2   NA 133*     No results found for: VANCT  All lab results for the last 24 hours reviewed.  Lab Results   Component Value Date    CULT  05/01/2022     This culture has been examined but additional incubation is required.    CULT  05/01/2022     Tested at [facility] [address] [phone]    CULT  05/01/2022     This culture has been examined but additional incubation is required.    CULT  05/01/2022     Tested at [facility] [address] [facility] [phone]     IMAGING & OTHER STUDIES     Imaging:  XR HAND LEFT PA LATERAL AND OBLIQUE    Result Date: 05/01/2022   Marked soft tissue swelling of the index finger with no underlying evidence of osteomyelitis or radiopaque foreign body.      Principal Problem:    Flexor tenosynovitis of finger  Active Problems:    Hypertension    Hepatitis C    Alcohol abuse    Opiate abuse, continuous (HCC)    HIV (human immunodeficiency virus infection) [facility])    Assessment   Alexander [name] is a 36 year old male admitted on 05/01/2022 with:    #Left index finger tenosynovitis:  S/p spider bite a few days ago.  Presenting with swelling and redness.  -2/2 BCx on 5/28: NGTD.  -s/p I/D of flexor sheath left index  finger with surgery on 5/29.  Pending cultures.    #Active polysubstance abuse/IVDA.  #Alcohol use disorder.    #HIV:  -Last Labs 03/09/21: HIV VL 35; CD4 of 468/23%.  -non-compliant with Triumeq.  -CD4:  389/35%    #Therapeutic drug monitoring:  Estimated Creatinine Clearance: 118 mL/min (A) (by C-G formula based on SCr of 0.59 mg/dL (L)).    PLAN   -Cefepime 2 g IV BID.  -Vancomycin, pharmacy to dose based on AUC dosing. Monitor Creatinine.  -Monitor blood cultures.  -Hold off Triumeq until further clarification of his compliance and if he is interested in restarting ART.  -HIV VL, CD4, HCV NAAT.  -Monitor OR cultures and BCx.  -Please obtain repeat blood cultures if febrile.  -discussed with Nursing staff.    Alexander Patricia, MD  [facility]    Electronically signed by: Alexander Patricia, MD, 05/01/2022 7:57 AM    Please note that some or all of this record may have been generated using voice recognition software. If there are any questions about the content of this document, please contact the author as some errors of transcription may have occurred.  Electronically signed by Alexander [name] at 05/01/2022  1:01 PM EDT

## 2022-05-01 NOTE — ED Provider Notes (Signed)
Formatting of this note is different from the original.  [facility]  ED Encounter Arrival Date: 05/01/22 0122  Alexander [name]                            DOB: August 13, 1986  [facility]  [place] [address]   MRN: [id number]   CSN: [id number]     HAR: [id number]     EMERGENCY DEPARTMENT - GENERAL NOTE    CHIEF COMPLAINT    Chief Complaint   Patient presents with   ? Hand Pain     Left index finger swelling and pain. Thinks it was a spider bite because he [place] in the woods     HPI   The nurses note was reviewed, agreed and appreciated.    The patient was seen and evaluated by Dr. Doylene Canard, Theron Arista, plan disposition and treatment were agreed upon.    History was obtained from paitent .    Alexander [name] is a 36 year old male who presents with a past medical history of HIV who presents with left index finger swelling.  Patient states that he thinks that he may have developed a bite to his left index finger as he started to develop diffuse swelling and pain over the last several days.  He states that he is currently homeless.  He reports that his left index finger is swollen red and very tender to touch.  He denies any nausea vomiting or diarrhea.    PAST MEDICAL HISTORY    Past Medical History:   Diagnosis Date   ? Hepatitis C    ? HIV (human immunodeficiency virus infection) (HCC)    ? Hypertension      SURGICAL HISTORY    Past Surgical History:   Procedure Laterality Date   ? MANDIBLE SURGERY Left 2015     CURRENT MEDICATIONS    Prior to Admission medications    Medication Sig Start Date End Date Taking? Authorizing Provider   hydrOXYzine Pamoate (VISTARIL) 25 mg capsule Take 1-2 capsules by mouth every 8 (eight) hours as needed. 05/11/21   Virgina Jock., MD   folic acid (FOLVITE) 1 MG TABS Take 1 tablet by mouth daily. 05/12/21   Virgina Jock., MD   multivitamin w/ minerals (THERA-M) TABS Take 1 tablet by mouth daily. 05/12/21   Virgina Jock., MD    metoprolol tartrate (LOPRESSOR) 25 MG TABS Take 25 mg by mouth 2 (two) times daily.    HISTORICAL MED   ondansetron (ZOFRAN-ODT) 4 MG TBDP Take 1 tablet by mouth every 8 (eight) hours as needed. 06/17/20   Schindewolf, Bayard Beaver, PA-C   TRIUMEQ 600-50-300 MG TABS Take 1 tablet by mouth daily. 02/21/20   HISTORICAL MED     ALLERGIES    No Known Allergies    FAMILY HISTORY    Family History   Problem Relation Age of Onset   ? Hypertension Mother    ? Seizures Father      SOCIAL HISTORY    Social History     Socioeconomic History   ? Marital status: Single   Tobacco Use   ? Smoking status: Every Day     Packs/day: 1.00     Years: 20.00     Total pack years: 20.00     Types: Cigarettes   ? Smokeless tobacco: Never   Vaping Use   ? Vaping Use: Never used  Substance and Sexual Activity   ? Alcohol use: Yes     Alcohol/week: 24.0 standard drinks of alcohol     Types: 24 Cans of beer per week     Comment: DAILY   ? Drug use: Yes     Types: Heroin, IV, Fentanyl     Comment: 04/30/2022 last use     REVIEW OF SYSTEMS    Constitutional: Negative for chills or fever  HENT: Negative for sore throat or ear pain.  Eyes: Negative for visual disturbance   Respiratory: Negative for shortness of breath or cough.    Cardiovascular: Negative for chest pain or syncope.   Gastrointestinal: Negative for abdominal pain, nausea, vomiting, or diarrhea.  Genitourinary: Negative for dysuria or hematuria.  Musculoskeletal: Reports redness swelling and pain to his left index finger.  Negative for back pain.   Skin: Reports redness and swelling to his left index finger.  Negative for rash.   Neurological: Negative for focal weakness  Psychiatric/Behavioral: Negative for depression     PHYSICAL EXAM    VITAL SIGNS: BP (!) 150/102   Pulse 116   Temp 98.3 F (36.8 C)   Resp 18   SpO2 98%    Constitutional: Well-developed well-nourished male sits comfortably in bed and appears well.  He is nontoxic.  HENT:  Atraumatic, Bilateral external ears  normal, TMs clear, Oropharynx moist, No oral exudates, Nose normal. Neck: Normal range of motion, No tenderness, Supple  Eyes:  PERRL, EOMI, Conjunctiva normal, No discharge, No injection   Respiratory:   Lungs clear bilaterally, No adventitious sounds appreciated with good equal rise and fall with chest  Cardiovascular:   Regular rate and rhythm, S1-S2 without murmurs   GI:  Abdomen soft, Positive bowel sounds, No tenderness, No guarding, No rebound  Musculoskeletal: 4 out of 4 Knievel signs.  Left index finger is stuck in flexion.  Patient has fusiform swelling to this left index finger.  Pain along the flexor tendon sheath.  Pain with passive extension of the left index finger.  Intact distal pulses, No edema, No tenderness. Good range of motion in all major joints. No tenderness to palpation or major deformities noted.  Integument: See Ortho Warm, Dry, No erythema, No rash.   Neurologic:  Alert & oriented x 3, Normal motor function, Normal sensory function, No focal deficits noted.   Psychiatric:  Affect normal, Judgment normal, Mood normal.     LABORATORY  Recent Results (from the past 24 hour(s))   BAMP (Na,K,Cl,CO2,Glu,BUN,Creat,Ca)    Collection Time: 05/01/22  1:51 AM   Result Value Ref Range    BLD UREA NITROGEN 7 (L) 8 - 26 mg/dL    SODIUM 578 (L) 469 - 145 mEq/L    POTASSIUM 4.2 3.6 - 5.1 mEq/L    CHLORIDE 99 98 - 111 mEq/L    CO2 26 21 - 31 mmol/L    GLUCOSE, RANDOM 93 70 - 99 mg/dL    CREATININE 6.29 (L) 0.70 - 1.30 mg/dL    ANION GAP 8 4 - 16 mmol/L    CALCIUM 8.4 (L) 8.5 - 10.4 mg/dL    ESTIMATED GFR 528 >41 mL/min/1.73 m2   CBC w/ Diff    Collection Time: 05/01/22  1:51 AM   Result Value Ref Range    WBC 9.3 3.6 - 10.5 THOU/mcL    RBC 4.79 4.40 - 5.80 MIL/mcL    HEMOGLOBIN 9.8 (L) 13.5 - 16.5 g/dL    HEMATOCRIT 32.4 (L) 40 - 50 %  MCV 65.1 (L) 82 - 97 fL    MCH 20.5 (L) 27 - 33 pg    MCHC 31.5 (L) 32 - 36 g/dL    RDW 01.7 (H) 49.4 - 17.0 %    PLATELET 109 (L) 140 - 375 THOU/mcL    MPV 8.5 7.4 -  11.5 fL    ABS. NEUTROPHIL 7.20 1.80 - 7.70 THOU/mcL    ABS LYMPHS 1.00 1.00 - 4.00 THOU/mcL    ABS MONOS 1.00 (H) 0.20 - 0.90 THOU/mcL    ABS EOS 0.00 (L) 0.03 - 0.45 THOU/mcL    ABS BASOS 0.00 0.00 - 0.20 THOU/mcL    SEGS 78 %    LYMPHOCYTES 11 %    MONOCYTES 11 %    EOSINOPHIL 0 %    BASOPHILS 0 %   C-Reactive Protein    Collection Time: 05/01/22  1:51 AM   Result Value Ref Range    C-REACTIVE PROTEIN 31.00 (H) <5.0 mg/L   Initial Lactate (Lactic Acid)    Collection Time: 05/01/22  1:51 AM   Result Value Ref Range    LACTATE, INITIAL 1.4 0.5 - 2.0 mmol/L     RADIOLOGY    XR HAND LEFT PA LATERAL AND OBLIQUE   Final Result       Marked soft tissue swelling of the index finger with no underlying evidence of osteomyelitis or radiopaque foreign body.                           I did review the labs and imaging results with the patient.  I discussed the meaning of these results as well as the reason for these tests.    ED COURSE & MEDICAL DECISION MAKING      Alexander [name] is a 36 year old male who presents with left index finger pain.  Patient's symptoms are consistent with flexor tenosynovitis.  I spoke with Dr. Leda Quail the hand surgeon on-call who states he will see the patient in the next couple hours for evaluation.  He instructed me to place the patient n.p.o. and go ahead and give him IV antibiotics.  Patient was admitted to the hospitalist service.  He was placed inpatient on MedSurg.  He was admitted to Dr. Melany Guernsey.    MEDICAL DECISION MAKING    Differential Diagnosis: Flexor tenosynovitis, paronychia, felon, cellulitis, osteomyelitis  Comorbidities that add complexity to management: HIV and hepatitis, history of IV drug abuse  Internal and external records are reviewed and revealed: None  History obtained from: Patient  Patient required discussion with: Hand surgeon and hospitalist  Diagnostic tests and treatments/medications: See above   Social determinants that affect care: None  Shared decision making  was done with the patient and/or family and/or patient representative: None    I discussed this patient's history, physical examination and clinical presentation with Dr.Dowiatt who independently saw the patient and concurs with the findings. Plan disposition and treatment were agreed upon.    The patient was given the following medication in the Emergency department:    Medication Administration from 05/01/2022 0122 to 05/01/2022 0309     Date/Time Order Dose Route Action Action by Comments    05/01/2022 0200 EDT morphine injection 4 mg 4 mg Intravenous Given Mary Diop --    05/01/2022 0201 EDT ondansetron (ZOFRAN) injection 4 mg 4 mg Intravenous Given Mary Diop --    05/01/2022 0241 EDT ampicillin-sulbactam (UNASYN) 3 g in sodium chloride 0.9 %  100 mL IVPB (RN MIX) 3 g Intravenous New Bag Mary Diop --       FINAL DIAGNOSIS    1. Flexor tenosynovitis of finger      The patient was given the following medications to go home with.    New Prescriptions    No medications on file     Follow-up Information    None      Electronically signed by:  Rudean Haskell. [id number], PA-C          Alvis Lemmings, PA-C  05/01/22 0309      Jacqlyn Larsen., DO  05/02/22 [id number]    Electronically signed by Jacqlyn Larsen., DO at 05/02/2022  6:38 AM EDT    Associated attestation - Jacqlyn Larsen., DO - 05/02/2022  6:38 AM EDT  Formatting of this note is different from the original.  EMERGENCY DEPARTMENT - ATTENDING NOTE        I have personally seen and examined this patient. I have fully participated in the care of this patient with the resident/APP.  I have reviewed and agree with all pertinent clinical information including history, physical exam, labs, radiographic studies and the plan. I have also reviewed and agree with the medications, allergies and past medical history sections for this patient.    I have performed the substantive part of the Physical Exam in this patient encounter.        ED Course as of 05/02/22 3474   Wynelle Link May 01, 2022   0224 ED Attending Attestation    I have personally performed and/or participated in the history, exam and medical decision making and agree with all pertinent clinical information.    I have personally performed a face-to-face diagnostic evaluation on this patient.     I was present during the key portions of the examination and treatment of the patient.  Case discussed with APC. Concur with treatment and disposition. All lab results, EKG tracings, and radiographic studies or interpretations were independently reviewed by me.    For all further details of the patient's emergency department visit, please see the APC documentation.    My exam shows    General- awake, alert  Head- normocephalic, atraumatic  Eyes- EOMI, no discharge  Neck- Trachea midline, no visible masses  Respiratory- No respiratory distress, no stridor  Neuro- Awake, alert, interactive  Tenosynovitis left index finger    Neldon Labella, DO  Emergency Medicine Attending        Electronically signed by:   Althea Grimmer. Dowiatt, DO

## 2022-05-01 NOTE — Consults (Signed)
Associated Order(s): ED CALL - OTHER  Formatting of this note is different from the original.  Hand Surgery Consult Note    Patient: Alexander [name] MRN: [id number]     Date of Birth: 1986/10/14  Age: 36 year old  Sex: male    Unit: GS08Q Room/Bed: 08143/01 [facility]: [facility]     Admitting Physician: Madalyn Rob     Primary Care Physician: Pcp, None, MD      Chief Complaint: Left index finger pain and swelling  Date of Consultation: 05/01/2022     Subjective:     Alexander [name] is a 36 year old male who presents with 3-4 days of left index finger pain and swelling.  He denies any recent trauma and thinks he may have been bitten by a spider.  The pain and swelling have worsened over the last 24 hr and primarily involve the volar surface of the finger and palm.  He is currently homeless and has a PMHx significant for HIV and IV drug abuse.  He was admitted by the internal medicine service earlier this morning for IV abx.  Pain has not improved and may have worsened over the last 2 or 3 hours.  He is noting increased pain within the palm overlying the index finger A1 pulley.  He is beginning to notice increasing numbness at the left index fingertip.    Patient Active Problem List    Diagnosis Date Noted   ? Hypertension      Priority: Low   ? Hepatitis C      Priority: Low   ? Alcohol abuse      Priority: Low   ? Opiate abuse, continuous (HCC)      Priority: Low   ? Alcohol withdrawal (HCC)      Priority: Low   ? HIV (human immunodeficiency virus infection) (HCC)      Priority: Low     Family History   Problem Relation Age of Onset   ? Hypertension Mother    ? Seizures Father      Social History     Tobacco Use   ? Smoking status: Every Day     Packs/day: 1.00     Years: 20.00     Total pack years: 20.00     Types: Cigarettes   ? Smokeless tobacco: Never   Substance Use Topics   ? Alcohol use: Yes     Alcohol/week: 24.0 standard drinks of alcohol     Types: 24 Cans of beer per week      Comment: DAILY     Past Medical History:   Diagnosis Date   ? Hepatitis C    ? HIV (human immunodeficiency virus infection) (HCC)    ? Hypertension      Past Surgical History:   Procedure Laterality Date   ? MANDIBLE SURGERY Left 2015     Prior to Admission medications    Medication Sig Start Date End Date Taking? Authorizing Provider   hydrOXYzine Pamoate (VISTARIL) 25 mg capsule Take 1-2 capsules by mouth every 8 (eight) hours as needed. 05/11/21   Virgina Jock., MD   folic acid (FOLVITE) 1 MG TABS Take 1 tablet by mouth daily. 05/12/21   Virgina Jock., MD   multivitamin w/ minerals (THERA-M) TABS Take 1 tablet by mouth daily. 05/12/21   Virgina Jock., MD   metoprolol tartrate (LOPRESSOR) 25 MG TABS Take 25 mg by mouth 2 (two) times daily.  HISTORICAL MED   ondansetron (ZOFRAN-ODT) 4 MG TBDP Take 1 tablet by mouth every 8 (eight) hours as needed. 06/17/20   Schindewolf, Bayard Beaver, PA-C   TRIUMEQ 600-50-300 MG TABS Take 1 tablet by mouth daily. 02/21/20   HISTORICAL MED     No Known Allergies     Review of Systems  Denies fever, chills, nausea/vomiting    Objective:     Vital signs in last 24 hours:  Temp:  [98.3 F (36.8 C)-98.4 F (36.9 C)] 98.4 F (36.9 C)  Pulse:  [100-116] 107  Resp:  [17-20] 20  BP: (128-150)/(82-102) 148/84    Physical Exam:  He is in mild distress but is nontoxic-appearing.  He is cooperative for the examination.  He is alert, oriented x3  Cardiovascular: Pulse is regular  Respiratory: No audible wheezing  Extremities focused to the upper extremities:  There is fusiform swelling of the left index finger.  There is tenderness to palpation at the flexor sheath most notably at the volar surface of the DIP joint and the A1 pulley.  There is mild erythema at the volar surface of the middle phalanx and distal phalanx without any palpable fluctuance.  He is holding the finger in a flexed position.  He has pain with passive extension of the finger.  He has multiple abrasions in  various stages of healing throughout the upper extremities but no other areas of focal tenderness, erythema, or fluctuance.    Imaging: 3 views of the left hand with diffuse soft tissue swelling at the index finger; no osteomyelitis    Lab Review:  Lab Results   Component Value Date    HCT 31.2 05/01/2022    HGB 9.8 05/01/2022    PLT 109 05/01/2022    RBC 4.79 05/01/2022    WBC 9.3 05/01/2022     Lab Results   Component Value Date    BUN 7 05/01/2022    CREATININE 24.3 05/01/2022    GLU 93 05/01/2022    K 4.2 05/01/2022    NA 133 05/01/2022     No results found for: APTT  No results found for: INR    Scheduled Meds:  ? enoxaparin  40 mg Subcutaneous Daily   ? diazePAM  10 mg Oral TID   ? piperacillin-tazobactam  3.375 g Intravenous Q8H   ? [START ON 05/02/2022] thiamine  100 mg Oral Daily   ? multivitamin w/ minerals  1 tablet Oral Daily   ? folic acid  1 mg Oral Daily   ? vancomycin  15 mg/kg Intravenous Q12H   ? vancomycin RX DOSED   Intravenous MAR PLACE HOLDER     Continuous Infusions:  ? sodium chloride 100 mL/hr (05/01/22 0607)     PRN Meds:sodium chloride (PF), sodium chloride, calcium carbonate, sennosides-docusate, polyethylene glycol, melatonin, benzonatate, sodium chloride 0.65%, nystatin, ondansetron, HYDROmorphone, oxyCODONE, LORazepam **OR** LORazepam **OR** LORazepam    Assessment:     Septic flexor tenosynovitis, left index finger    Plan:     I recommended urgent incision and drainage of the left index finger flexor sheath.  I did discuss the risks and benefits of this procedure.  Incisions are left open or loosely closed over drain to heal by secondary intention.  There is a risk of iatrogenic tendon/nerve/vascular injury, painful scar, or the need for further surgery.  I told him to anticipate being admitted for 24 to 48 hours until her operative cultures are available.  He understands and elects to proceed.  The surgical site was marked and the consent confirmed.     Thank you very much for  asking me to consult in the care of your patient Alexander [name].    Signed by: Theresia Lo, MD  Electronically signed by Theresia Lo., MD at 05/01/2022  7:41 AM EDT

## 2022-05-01 NOTE — Assessment & Plan Note (Signed)
Formatting of this note is different from the original.  Pt admitted with left index finger swelling. To go for surgery this morning. PMhx- HIV, Hepatitis C, Opiate abuse, ETOH abuse, HTN. Pt reports that he is homeless. He does not have a PCP and declines a HARP consult at this time. May need transportation through his [facility] [phone]). Plan home with no needs. CM to follow.    Patient admitted with   1. Flexor tenosynovitis of finger        Initial Care Management/Social Worker Assessment   Patient admitted from: Homeless and lives with Alone  Anticipated Discharge Plan: Home with no needs   Number of steps to enter home:      Home Care Options Discussed with Patient/Family:    Do you have Diabetes?: No  Are you able to afford diabetic supplies?: N/A If no, what resources where offered?  Need for toileting asked to patient: Yes  Support system available at home: No   Who?: N/A   Amount of availability: N/A   Able to provide physical assistance (toileting/bathing/dressing)?: N/A  Potential Discharge Barriers: blood cultures, Morphine IV, Zofran IV, Unasyn IV, hand surgeon consult, hand surgery  Primary family/friend contact & phone number:  Patient Contacts     Name Relation Home Work Mobile    Alexander [name] Sister   [phone]       Anticipated Discharge Date:   Anticipated Discharge Transportation provided by: unknown  Preferred pharmacy:  [facility] [place], [place] - [address]  [phone]  [facility] [id number] [place], [place] - [address]  [phone]  Confirmed patient's preferred pharmacy: Yes  PCP name: Pcp, None, MD  PCP verified this visit: No  If NO PCP noted/Plans for follow up: N/A  Care Management handouts provided:   CM Brochure:  Yes  MOON letter if applicable:    IML if applicable:      CM or SW will follow: Jenkins Rouge, Registered Nurse    Flowsheet assessment:  Care Manager/Social Worker Psychosocial  Assessment    Date: 05/01/2022    Patient Name: Alexander [name] Date of Birth: 1986-07-09  Primary Care Physician: Pcp, None   Attending: Jacqlyn Larsen., DO;So*    Info provided by: Chart;Patient  PCP Verified?: No (Declines HARP at this time)  Does patient have decision making capability: Yes  Does patient have family member/DPOA to assist with decision making: No    Prior to Admission:  PCP Verified?: No (Declines HARP at this time)  Type of Home: Homeless  Lives With: Alone  Patient is Caretaker: No  Has Patient Resided in ECF, [place], [place], Nursing Home or Hospital in Past 3 Months?: No  Level of Independence: Independent  Active with Home Care: No  Current DME: No  Active with Community Agency: No    Current:  Level of Consciousness: Alert  Orientation Level: Oriented X4  Cognition: Appropriate judgement;Appropriate safety awareness;Appropriate attention/concentration    Discharge:  Who is requesting discharge planning?: Initial CM Assessment  Who will be picking you up at discharge?: Other (comment) (Pt does not know at this time)  Anticipated DC Plan: Home with no needs  Potential Discharge Barriers: blood cultures, Morphine IV, Zofran IV, Unasyn IV, hand surgeon consult, hand surgery  Follow up appointment: Not Laurena Bering, TPP or QCP      Electronically signed by Jenkins Rouge, Registered Nurse at 05/01/2022  4:23 AM EDT

## 2022-05-01 NOTE — Other (Signed)
Formatting of this note is different from the original.  OPERATIVE NOTE  Theresia Lo, MD    Patient: Alexander [name] MRN: [id number]     Date of Birth: Oct 01, 1986  Age: 36 year old  Sex: male    Unit: GS MAIN PERIOP SVCS Room/Bed: GSPS/PSPB Location: [facility]     Date of Procedure: 05/01/2022     Preoperative Diagnosis: Septic Flexor Tenosynovitis, Left Index Finger    Postoperative Diagnosis: Same    Procedure: Procedure(s):  INCISION & DRAINAGE FLEXOR SHEATH LEFT INDEX FINGER    Preop Dx: Septic flexor tenosynovitis, left index finger  Postoperative diagnosis: Same    CPT codes: 30865    Surgeon(s) and Role:     * Theresia Lo., MD - Primary    * No surgical staff found *    DISPOSITION:  He was taken to the recovery area in satisfactory condition.    Specimen: []  None []   Pathology  [x]   Bacteriology  EBL: [x]  Minimal  []   25-50 mL  []   50-100 mL  []   100-200 mL  []   Other:  _____   Post-operative instructions given: yes  Follow-up in office:  []   7-10 days  []   1-5 days  [x]   10-14 days  []   Other:  ____  Anesthesia:  [x]  General    [x]  Local    []  MAC    []  Axillary block/IV sedation  []  Supraclavicular block/IV sedation    []  Axillary block/General  []   Supraclavicular block/IV sedation    []  General    Complications:  [x]  None   []  _____________________________  Implant: [x]  None    []  Yes: _______________________________    Tourniquet Time:   Total Tourniquet Time Documented:  Lower Arm (Left) - 20 minutes  Total: Lower Arm (Left) - 20 minutes    C-Arm Utilization during the procedure:  no    Operative Indications: Symptoms refractory to conservative treatment.  Further details are available in the office notes.     Description of Procedure: The patient was identified in the preoperative holding area.  The operative extremity was identified by the patient and initialed prior to transport.  He was taken to the operating room, where he was placed in the supine position.   General endotracheal anesthesia was administered.  Prophylactic IV antibiotics had already been administered per the hospitalist service.  The left hand and arm were then prepped and draped in the standard sterile fashion.  His hand and arm were exsanguinated with an Esmarch.  A tourniquet was inflated to 250 mmHg.    I made a radially based Loletha Carrow style incision overlying the volar surface of the middle phalanx and distal phalanx.  There is immediate egress of purulent fluid and tissue after incising through the skin.  I was able to dissect to the level of the flexor sheath.  I opened the sheath distal to the A4 pulley to inspect the flexor tendons.  Flexor tenosynovium and some necrotic appearing subcutaneous tissue/fat was sent for aerobic and anaerobic culture.  The flexor tendons were otherwise intact.  A 16-gauge angiocatheter was placed deep to the flexor tendons.  A counterincision was then made in the palm.  The incision was oblique and just proximal to the A1 pulley.  I irrigated saline through the angiocatheter until the fluid appeared clear within the palm.  The saline was able to flow freely.  The angiocatheter was withdrawn and then placed within the flexor  sheath at the palmar incision.  An additional 200 cc of saline was then irrigated in an antegrade fashion until the fluid appeared clear.  I then deflated the tourniquet.  The finger was warm and well-perfused.  A digital block was then completed utilizing 10 cc of half percent Marcaine plain.  I loosely closed the finger incision and placed a red rubber catheter at the finger incision.  I confirmed this was not sewn in place by freely pulling at both antegrade and retrograde.  Xeroform, 4 x 4's, and an ABD were applied.  He was wrapped with an Ace wrap and Curlex.  He was awakened and transferred to the recovery area in stable condition and there were no complications.    He has been admitted to the internal medicine service.  We will follow his  cultures.  The catheter/drain will likely be removed tomorrow.    Signed By: Abner Greenspan MD 05/01/2022 8:37 AM      Electronically signed by Theresia Lo., MD at 05/01/2022  8:43 AM EDT

## 2022-05-01 NOTE — Nursing Note (Signed)
Formatting of this note might be different from the original.  Pt drenched in sweat, disoriented, and drowsy. Scored 15 on CIWA scale. Qualifies for 4 mg ativan but order states to hold for lethargy. BP elevated at 160/100. MD notified.    Update: MD added prn medication for high BP  Electronically signed by Adrian Prows, Registered Nurse at 05/01/2022  3:56 PM EDT

## 2022-05-01 NOTE — Progress Notes (Signed)
Formatting of this note is different from the original.  Images from the original note were not included.      [facility] PROGRESS NOTE  [facility]    Patient: Alexander [name] MRN: [id number]  Room/Bed:  91478/29   Date of Birth: [birth year]  Age: 36 year old  Gender: male      Date of Service   05/01/2022       SUBJECTIVE     The patient is being seen for follow-up of Flexor tenosynovitis of finger.  Pain in left hand is controlled.  He is lethargic as he had surgery this morning.  Nurse reported that he was very diaphoretic earlier today and blood pressure is very elevated.  No shortness of breath, nausea or vomiting    OBJECTIVE     PHYSICAL EXAM  Temp:  [97.3 F (36.3 C)-98.5 F (36.9 C)] 98.2 F (36.8 C)  Pulse:  [66-116] 90  Resp:  [13-31] 18  BP: (107-160)/(70-102) 154/90  SpO2:  [85 %-100 %] 97 %  O2 Device: None (Room air)  O2 Flow Rate (L/min):  [2 L/min-4 L/min] 2 L/min  O2 (%): --  Estimated body mass index is 20.73 kg/m as calculated from the following:    Height as of 04/28/20: 69 (175.3 cm).    Weight as of this encounter: 140 lb 6.4 oz (63.7 kg).  Constitutional:  No distress  HENT:  Normocephalic, atraumatic, neck supple.  Eyes:  EOMI, Conjunctiva normal, No discharge.   Cardiovascular:  Normal heart rate, Normal rhythm, No murmurs, No rubs, No gallops.   Respiratory:  Normal breath sounds, No respiratory distress, +mild wheezing, No rhonchi, No crackles.   GI:  Bowel sounds normal, Soft, No tenderness, No rebound or guarding, No masses.   Integument: Very dry skin lower abdomen and patches of dry skin on bilateral legs  Musculoskeletal/Extremities:  No edema.  Left hand and second finger are wrapped  Neurologic: Lethargic, but arouses to voice & oriented x 3, did not know the month.  cranial nerves grossly intact    Intake and Output:    Intake/Output Summary (Last 24 hours) at 05/01/2022 1805  Last data filed at 05/01/2022 1050  Gross per 24 hour   Intake 601.01 ml   Output 5  ml   Net 596.01 ml       LDAs     Patient Lines/Drains/Airways Status     Active LINES/DRAINS/AIRWAYS     None           Labs/Cultures:  Recent Results (from the past 24 hour(s))   BAMP (Na,K,Cl,CO2,Glu,BUN,Creat,Ca)    Collection Time: 05/01/22  1:51 AM   Result Value Ref Range    BLD UREA NITROGEN 7 (L) 8 - 26 mg/dL    SODIUM 562 (L) 130 - 145 mEq/L    POTASSIUM 4.2 3.6 - 5.1 mEq/L    CHLORIDE 99 98 - 111 mEq/L    CO2 26 21 - 31 mmol/L    GLUCOSE, RANDOM 93 70 - 99 mg/dL    CREATININE 8.65 (L) 0.70 - 1.30 mg/dL    ANION GAP 8 4 - 16 mmol/L    CALCIUM 8.4 (L) 8.5 - 10.4 mg/dL    ESTIMATED GFR 784 >69 mL/min/1.73 m2   CBC w/ Diff    Collection Time: 05/01/22  1:51 AM   Result Value Ref Range    WBC 9.3 3.6 - 10.5 THOU/mcL    RBC 4.79 4.40 - 5.80 MIL/mcL  HEMOGLOBIN 9.8 (L) 13.5 - 16.5 g/dL    HEMATOCRIT 40.9 (L) 40 - 50 %    MCV 65.1 (L) 82 - 97 fL    MCH 20.5 (L) 27 - 33 pg    MCHC 31.5 (L) 32 - 36 g/dL    RDW 81.1 (H) 91.4 - 17.0 %    PLATELET 109 (L) 140 - 375 THOU/mcL    MPV 8.5 7.4 - 11.5 fL    ABS. NEUTROPHIL 7.20 1.80 - 7.70 THOU/mcL    ABS LYMPHS 1.00 1.00 - 4.00 THOU/mcL    ABS MONOS 1.00 (H) 0.20 - 0.90 THOU/mcL    ABS EOS 0.00 (L) 0.03 - 0.45 THOU/mcL    ABS BASOS 0.00 0.00 - 0.20 THOU/mcL    SEGS 78 %    LYMPHOCYTES 11 %    MONOCYTES 11 %    EOSINOPHIL 0 %    BASOPHILS 0 %   C-Reactive Protein    Collection Time: 05/01/22  1:51 AM   Result Value Ref Range    C-REACTIVE PROTEIN 31.00 (H) <5.0 mg/L   Culture Blood Adult - First Set    Collection Time: 05/01/22  1:51 AM    Specimen: Blood (Peripheral Site 1)   Result Value Ref Range    SPECIMEN DESCRIPTION BLOOD (SITE 1 PERIPHERAL)     SPECIMEN TYPE Blood     CULTURE RESULTS       This culture has been examined but additional incubation is required.    MICRONOTE       Tested at [facility] [address] [place] [address] [phone]    REPORT STATUS PENDING    Initial Lactate (Lactic Acid)    Collection Time: 05/01/22  1:51 AM   Result Value Ref Range    LACTATE,  INITIAL 1.4 0.5 - 2.0 mmol/L   Culture Blood Adult - Second Set    Collection Time: 05/01/22  2:29 AM    Specimen: Blood (Peripheral Site 2)   Result Value Ref Range    SPECIMEN DESCRIPTION BLOOD (SITE 2 PERIPHERAL)     SPECIMEN TYPE Blood     CULTURE RESULTS       This culture has been examined but additional incubation is required.    MICRONOTE       Tested at [facility] [address] [facility] [phone]    REPORT STATUS PENDING    URINE DRUG SCREEN 9    Collection Time: 05/01/22  5:01 AM   Result Value Ref Range    PCP NEGATIVE NEGATIVE    BENZODIAZEPINES NEGATIVE NEGATIVE    COCAINE POSITIVE (A) NEGATIVE    AMPHETAMINES NEGATIVE NEGATIVE    TETRAHYDROCANNIBINOL NEGATIVE NEGATIVE    OPIATES POSITIVE (A) NEGATIVE    BARBITURATES NEGATIVE NEGATIVE    METHADONE NEGATIVE NEGATIVE    OXYCODONE NEGATIVE NEGATIVE    CREATININE URINE 24.3 mg/dL   ETHANOL,SERUM    Collection Time: 05/01/22  5:36 AM   Result Value Ref Range    ETHANOL, SERUM 99 (H) <10.0 mg/dL   QM5/HQ4 Panel    Collection Time: 05/01/22  5:36 AM   Result Value Ref Range    ABSOLUTE CD4 389 (L) 488 - 1,711 cells/uL    ABSOLUTE CD8 550 154 - 1,097 cells/uL    CD4/CD8 RATIO 0.71 0.6 - 3.1    %CD4 35.60 32 - 63 %    %CD8 50.40 (H) 9 - 39 %       Blood sugars:  No results found for:  NPGLU    IMAGING & OTHER STUDIES     XR HAND LEFT PA LATERAL AND OBLIQUE    Result Date: 05/01/2022   Marked soft tissue swelling of the index finger with no underlying evidence of osteomyelitis or radiopaque foreign body.        ASSESSMENT     Principal Problem:    Flexor tenosynovitis of finger  Active Problems:    Hypertension    Hepatitis C    Alcohol abuse    Opiate abuse, continuous (HCC)    HIV (human immunodeficiency virus infection) (HCC)    Cocaine abuse (HCC)    PLAN     1. Left second finger septic flexor tenosynovitis-incision and drainage in OR on 5/28 by hand surgery.  Infectious disease consulted.  Patient is on vancomycin and cefepime.  Awaiting cultures  2.  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  Will discuss Suboxone near time of discharge  3. Alcohol abuse-CIWA protocol.  Valium 10 mg p.o. every 8 hours  4. Accelerated hypertension- blood pressure elevated shin most likely due to alcohol withdrawal.  IV hydralazine as needed  5. Cocaine abuse  6. HIV positive- not compliant with his HAART medication.  We will hold them at this time  7. Hyponatremia-give IV fluids  8. Thrombocytopenia-monitor  9. Microcytic anemia-check iron studies  10. Tobacco use disorder-patient requesting nicotine gum  11. Dry skin/dermatitis on abdomen and legs - apply Eucerin cream  12. Homeless    DVT prophylaxis: Intermediate to High risk on  Lovenox SC  Anticipated date of discharge: ELOS: DC 1-2 days  Anticipated venue at discharge: Home  Barriers to discharge: Improvement in finger infection, alcohol withdrawal,     Electronically signed by: Blair Dolphin, MD, 05/01/2022     Please refer to Treatment Team for active Hospitalist and contact number.    Please note that some or all of this record was generated using voice recognition software. If there are any questions about the content of this document, please contact the author as some errors of transcription may have occurred.     DURING THE DAY: From 8:30 to 5pm, for patient needs, please contact the listed attending physician in Epic or [place].   AFTER HOURS: From 5pm to 8:30am, for urgent needs, please contact the cross cover hospitalist for your hospital in [place].  Electronically signed by Blair Dolphin, MD at 05/02/2022  7:59 AM EDT

## 2022-05-01 NOTE — Other (Signed)
Formatting of this note is different from the original.  Brief Operative Note    Patient: Alexander [name] MRN: [id number]     Date of Birth: [birth year]  Age: 37 year old  Sex: male    Unit: GS MAIN PERIOP SVCS Room/Bed: GSPS/PSPB Location: [facility]     Date of Procedure: 05/01/2022   Preoperative Diagnosis: * No pre-op diagnosis entered *  Postoperative Diagnosis: Same  Procedure: Procedure(s) with comments:  INCISION & DRAINAGE FLEXOR SHEATH LEFT INDEX FINGER - INCISION & DRAINAGE FLEXOR SHEATH LEFT INDEX FINGER  Post-Op Diagnosis Codes:     * Cellulitis and abscess of hand [L03.119, L02.519]  Pre-Op Diagnosis Codes:     * Cellulitis and abscess of hand [L03.119, L02.519]  Surgeon(s) and Role:     * Theresia Lo., MD - Primary  Assistant(s): Circulator: Lawson Radar, Registered Nurse  Scrub Person: Franne Grip, CST  CLINICAL SUPPORT: Manning Charity, Registered Nurse  Anesthesia: General   Estimated Blood Loss: Minimal  * No specimens in log *  Findings: c/w preop   Complications: none  Implants: None    Signed By: Theresia Lo, MD 05/01/2022 8:31 AM      Electronically signed by Theresia Lo., MD at 05/01/2022  8:31 AM EDT

## 2022-05-02 NOTE — Progress Notes (Signed)
Formatting of this note is different from the original.  Hand Surgery Progress Note    Subjective:  Patient seen and evaluated. Resting in bed. States left hand feels sore.     Objective:  Vitals:    05/02/22 0631   BP: 149/81   Pulse:    Resp:    Temp:    SpO2:      Exam:  Gen: NAD, A&O  Cardio: ext warm, well-perfused  Pulm: breathing comfortably on RA    LUE: Incisions over volar index finger A1 pulley and DIP joint with penrose drain in place  No expressible purulent drainage  Index finger moderately swollen  Compartments soft/compressible  SILT M/U/R  FPL/EPL/FDS/EDC/IO intact  Palpable radial pulse, brisk cap refill    Labs:  CBC  Recent Labs     05/01/22  0151 05/02/22  0335   HCT 31.2* 26.5*   HGB 9.8* 8.3*   PLT 109* 88*   WBC 9.3 5.4     BMP  Recent Labs     05/01/22  0151 05/02/22  0335   BUN 7* 11   CA 8.4* 7.3*   CL 99 106   CO2 26 24   GLU 93 92   K 4.2 3.4*   NA 133* 136     Coags  No results for input(s): INR, PROTIME, PTT in the last 72 hours.    A/P: 36 yo M with left index finger flexor tenosynovitis s/p I&D left index finger 05/01/22    -Penrose drain removed this AM  -Start BID warm soapy soaks left hand for 20 minutes  -Follow up intra-op cultures: gram positive cocci  -Continue IV antibiotics per infectious disease  -Elevate left hand  -Will discuss with Dr. Mancel Bale, MD  Hand Surgery Fellow      Electronically signed by Tomasa Hose at 05/02/2022  7:08 AM EDT

## 2022-05-02 NOTE — Assessment & Plan Note (Signed)
Formatting of this note might be different from the original.  Patient is okay to discharge from the care management/social work perspective.  CM/SW needs have been arranged if needed.    Patient with discharge order in place. Patient requested bus pass, gave this to patient, no further needs identified at this time.   Electronically signed by Ennis Forts, Registered Nurse at 05/02/2022  4:35 PM EDT

## 2022-05-02 NOTE — Progress Notes (Signed)
Formatting of this note might be different from the original.  For your records, the bill status(es) for Alexander [name] during their hospital stay is as follows:  05/01/2022 0134  Admission Emergency  05/01/2022 0312  Patient Update Inpatient  05/02/2022 1656  Discharge Inpatient     :    Any questions regarding PATIENT CLASS/STATUS should be directed to the Care Management Departments:  [facility] [phone] or [facility] [phone] or [facility] [phone].  Electronically signed by Ebbie Latus, MD at 05/03/2022  8:40 AM EDT

## 2022-05-02 NOTE — Discharge Summary (Signed)
Formatting of this note is different from the original.  Images from the original note were not included.      [facility] discharge summary  [facility]    Patient: Alexander [name] MRN: [id number]  Room/Bed:  91478/29   Date of Birth: [birth year]  Age: 36 year old  Gender: male      Admit Date:  05/01/2022                                       Discharge Date:  05/02/2022 patient left AGAINST MEDICAL ADVICE    Code Status:  Full Code   Primary Care Physician  Pcp, None, MD  Attending Physician  Blair Dolphin, MD    Discharge Diagnoses: Principal Problem:    Flexor tenosynovitis of finger  Active Problems:    Hypertension    Hepatitis C    Alcohol abuse    Opiate abuse, continuous (HCC)    HIV (human immunodeficiency virus infection) (HCC)    Cocaine abuse (HCC)    Iron deficiency anemia    Issues for primary care follow-up  none    Discharge Follow up/ Next Steps: Theresia Lo., MD  [facility]  [address] [place] [address]  [phone]    Schedule an appointment as soon as possible for a visit in 1 week(s)    Dante Gang, MD  [facility]  [address]  [place] [address]  [phone]    Call in 1 month(s)  for blood in stool    Hospital Course:  The patient is a(n) 36 year old male who was admitted for pain and swelling left second finger.  1. Left second finger septic flexor tenosynovitis-Had incision and drainage in OR on 5/28 by Hand surgery.  Infectious disease consulted.  Patient placed on vancomycin and cefepime.  Culture growing gram-positive cocci.  Patient insisted on leaving AGAINST MEDICAL ADVICE on 05/02/2022.  I discussed with infectious disease and they recommended discharging him on linezolid 600 mg p.o. twice daily x14 days.  We instructed patient to soak his left hand in warm soapy water twice daily x20 minutes.  He was given supplies of gauze to keep his left finger wrapped.  He has been given  information to contact the hand surgeon for follow-up as outpatient.  2. Fentanyl IV drug abuse-Placed on Suboxone.  3. Alcohol abuse-placed on CIWA protocol and Valium 10 mg p.o. every 8 hours at the time he wanted to leave AMA his CIWA score was high at 20.  I evaluated patient and he was alert and oriented x3, but did have tremors.  I felt patient had capacity to make medical decisions at that time.  4. Accelerated hypertension- most likely due to alcohol withdrawal.  IV hydralazine as needed.  He is on metoprolol at home.  He can restart his metoprolol as outpatient  5. Cocaine abuse  6. HIV positive- not compliant with his HAART medication.  Held HAART at this time  7. Hyponatremia- resolved with IV fluids  8. Thrombocytopenia- platelets were 88,000 on the date he left AMA  9. Iron deficiency anemia-given IV Venofer.  Consulted GI as patient states he has had melena recently.  They declined to scope him due to being cocaine positive.  He was given information to follow-up with GI as outpatient.  Started on PPI and iron tablets  10.  Tobacco use disorder-patient requested nicotine gum  11. Dry skin/dermatitis on abdomen and legs - applied Eucerin cream  12. Homeless-lives in a tent  13. Patient left AMA despite me trying to convince him to stay.  Patient states he has a dog that has puppies and he needs to take care of it    Disposition:  Home                   Discharge Medications:       Medication List     START taking these medications    ferrous sulfate 325 (65 FE) MG Tabs  Take 1 tablet by mouth daily with dinner.    linezolid 600 MG Tabs  Commonly known as: ZYVOX  Take 1 tablet by mouth 2 (two) times daily for 14 days. Indications: Cellulitis (skin soft tissue, wound infections)    pantoprazole DR 40 MG Tbec  Commonly known as: Protonix  Take 1 tablet by mouth every morning before breakfast. Indications: Stomach Ulcer      CONTINUE taking these medications    folic acid 1 MG Tabs  Commonly known as:  FOLVITE  Take 1 tablet by mouth daily.    hydrOXYzine Pamoate 25 mg capsule  Commonly known as: VISTARIL  Take 1-2 capsules by mouth every 8 (eight) hours as needed.    metoprolol tartrate 25 MG Tabs  Commonly known as: LOPRESSOR    multivitamin w/ minerals Tabs  Take 1 tablet by mouth daily.    ondansetron 4 MG Tbdp  Commonly known as: ZOFRAN-ODT  Take 1 tablet by mouth every 8 (eight) hours as needed.    Triumeq 600-50-300 mg Tabs  Generic drug: abacavir-dolutegravir-lamiVUDine        Where to Get Your Medications     These medications were sent to [facility] [id number] [place], [place] [facility] - [address] AT [address]  [address] [place] [address]    Phone: [phone]    ferrous sulfate 325 (65 FE) MG Tabs   linezolid 600 MG Tabs   pantoprazole DR 40 MG Tbec      Consults:  IP CONSULT TO PSYCHIATRY  Hand surgery  Infectious disease  GI    Procedures:   XR HAND LEFT PA LATERAL AND OBLIQUE    Result Date: 05/01/2022   Marked soft tissue swelling of the index finger with no underlying evidence of osteomyelitis or radiopaque foreign body.      Procedure(s):  INCISION & DRAINAGE FLEXOR SHEATH LEFT INDEX FINGER    Physical Exam:  I saw and and examined the patient on the day of discharge.   Vital Signs:  Temp:  [97.4 F (36.3 C)-97.9 F (36.6 C)] 97.4 F (36.3 C)  Pulse:  [59-81] 75  Resp:  [16-20] 20  BP: (135-153)/(79-109) 147/83  Constitutional:  No distress  HENT:  Normocephalic, atraumatic, neck supple.  Eyes:  EOMI, Conjunctiva normal, No discharge.   Cardiovascular:  Normal heart rate, Normal rhythm, No murmurs, No rubs, No gallops.   Respiratory:  Normal breath sounds, No respiratory distress, No alert and oriented x3 wheezing, No rhonchi, No crackles.   GI:  Bowel sounds normal, Soft, No tenderness, No rebound or guarding, No masses.   Integument: Very dry skin lower abdomen and patches of dry skin on bilateral legs  Musculoskeletal/Extremities:  No edema.  Left hand and second finger are  wrapped  Neurologic:  Alert and oriented x3,  cranial nerves grossly intact  Condition at Discharge: Stable.  Patient left AGAINST MEDICAL ADVICE    Thank you for allowing [place] to participate in this patient's care.   Please call us at [phone] if there are any questions or concerns    Electronically signed by: Blair Dolphin, MD, 05/02/2022 6:30 PM    Please note that some or all of this record was generated using voice recognition software. If there are any questions about the content of this document, please contact the author as some errors of transcription may have occurred.    Electronically signed by Blair Dolphin, MD at 05/02/2022  6:37 PM EDT

## 2022-05-02 NOTE — Assessment & Plan Note (Signed)
Formatting of this note might be different from the original.  Pharmacy note re:  Vancomycin management    S:   Pharmacy asked to dose vancomycin by Dr. Melany Guernsey for treatment of cellulitis/septic flexor tenosynovitis.  Goal AUC is 400.  Today is treatment day 2.      O:   Current vancomycin regimen:  1000 mg IV q12h    Last dose given:  5/29 at 0451         Vancomycin random concentration 1:  13.2 mcg/mL   Vancomycin random concentration 2:  6.5 mcg/mL    A:   Patient has reached steady state.            Based on the vancomycin random concentration results and two-point kinetic calculations,             the current dosing regimen will be modified to maintain the goal AUC.    P:   Change vancomycin regimen to 1000 mg Q8H.              Will recheck vancomycin random concentrations after 3rd dose of new regimen (5/30 at 1230 and 1530) to ensure AUC goal is maintained.     Will continue to follow and make adjustments to vancomycin therapy as necessary.    Sherlynn Carbon, PharmD  05/02/2022  Electronically signed by Sherlynn Carbon, PharmD at 05/02/2022  4:14 PM EDT

## 2022-05-02 NOTE — Nursing Note (Signed)
Formatting of this note might be different from the original.  Pt leaving AMA. Hospitalist informed, spoke with pt.   Electronically signed by Jerry Caras, Registered Nurse at 05/02/2022  4:10 PM EDT

## 2022-05-02 NOTE — Consults (Signed)
Formatting of this note is different from the original.    Gastroenterology CONSULT NOTE  [facility]    Patient: Alexander [name] MRN: [id number]  Room/Bed:  72536/64   Date of Birth: 02/14/1986  Age: 36 year old  Gender: male      Consult Date:  05/02/2022  Primary Care Physician: Marisa Sprinkles, MD  Date of Admission:  05/01/2022  Admitting Diagnosis: Flexor tenosynovitis of finger [M65.9]    History      Reason for Consult: Apostolos Biernat is being seen in consultation at the request of MARINAKIS, VASILIKI for melena, Fe def anemia.    History of Present Illness: 36 year old male with significant PMH for HIV noncompliant with medical therapy, HCV, alcoholism and IVDA.  He is homeless and [place] the [place]. Admitted with infected left index finger. Noted to be anemic. He describes one brown stool per day. Not very communicative - falls asleep. Denies hematochezia or melena. No abdominal pain or heart burn.     Cocaine positive on admission    GI History:  See above    GI History  Reviewed: Yes    Past Medical History  Past Medical History:   Diagnosis Date   ? Hepatitis C    ? HIV (human immunodeficiency virus infection) (HCC)    ? Hypertension      Past Surgical History  Past Surgical History:   Procedure Laterality Date   ? MANDIBLE SURGERY Left 2015     Prior to Admission Medications  Prior to Admission medications    Medication Sig Start Date End Date Taking? Authorizing Provider   hydrOXYzine Pamoate (VISTARIL) 25 mg capsule Take 1-2 capsules by mouth every 8 (eight) hours as needed. 05/11/21   Virgina Jock., MD   folic acid (FOLVITE) 1 MG TABS Take 1 tablet by mouth daily. 05/12/21   Virgina Jock., MD   multivitamin w/ minerals (THERA-M) TABS Take 1 tablet by mouth daily. 05/12/21   Virgina Jock., MD   metoprolol tartrate (LOPRESSOR) 25 MG TABS Take 25 mg by mouth 2 (two) times daily.    HISTORICAL MED   ondansetron (ZOFRAN-ODT) 4 MG TBDP Take 1 tablet by mouth every 8  (eight) hours as needed. 06/17/20   Schindewolf, Bayard Beaver, PA-C   TRIUMEQ 600-50-300 MG TABS Take 1 tablet by mouth daily. 02/21/20   HISTORICAL MED       Current Medications:  ? nicotine  21 mg Transdermal Daily   ? amLODIPine  5 mg Oral Daily   ? iron sucrose  300 mg Intravenous Daily   ? pantoprazole  40 mg Intravenous BID AC   ? enoxaparin  40 mg Subcutaneous Daily   ? thiamine  100 mg Oral Daily   ? multivitamin w/ minerals  1 tablet Oral Daily   ? folic acid  1 mg Oral Daily   ? vancomycin  15 mg/kg Intravenous Q12H   ? vancomycin RX DOSED   Intravenous MAR PLACE HOLDER   ? insulin aspart  0-12 Units Subcutaneous Once   ? ceFEPIme  2 g Intravenous Q12H   ? diazePAM  10 mg Oral Q8H   ? lanolin-mineral oil   Topical BID     Allergies  No Known Allergies    Social History   Social History     Tobacco Use   ? Smoking status: Every Day     Packs/day: 1.00     Years: 20.00  Total pack years: 20.00     Types: Cigarettes   ? Smokeless tobacco: Never   Substance Use Topics   ? Alcohol use: Yes     Alcohol/week: 24.0 standard drinks of alcohol     Types: 24 Cans of beer per week     Comment: DAILY       Family History  Family History   Problem Relation Age of Onset   ? Hypertension Mother    ? Seizures Father      SUBJECTIVE     ROS:     General: no fever or weight change  Hematologic: no unexpected submucosal bleeding or bruising   HEENT: no sore throat or facial pain  Respiratory: no cough, no dyspnea  Cardiovascular: no angina, no dependent edema  Gastrointestinal: see HPI.   Musculoskeletal: no unusual joint pain or stiffness  Skin: no skin eruptions or changing lesions  Neurologic: no focal weakness, numbness  Psychiatric: no anxiety, no sleep disturbance.    OBJECTIVE     Vital Signs: BP 138/82   Pulse 68   Temp 97.4 F (36.3 C) (Oral)   Resp 20   Wt 140 lb 6.4 oz (63.7 kg)   SpO2 100%   BMI 20.73 kg/m     PHYSICAL EXAM                 General: Well-nourished, well developed  HEENT: sclera anicteric,  mucosal membranes moist  , sleepy, does not make eye contact, mumbles and gives short answers  Cardiovascular: Regular rate and rhythm.  Respiratory: Respirations non-labored,  GI: Abdomen non-distended, soft, and nontender.  Rectal: Deferred  Musculoskeletal: No pitting edema of the lower legs.  Neurological: Gross memory appears intact. Patient is sleepyand oriented.     Imaging/Labs:    Recent Labs     05/02/22  0335   B12 274   FERR 7*     No results for input(s): ALB, ALT, ALTPOC, AP, ASTPOC, CBIL, TBIL, TP in the last 72 hours.    Invalid input(s): AGRAT, GLOB, GPT, SGOT    Recent Labs     05/01/22  0151 05/02/22  0335   HCT 31.2* 26.5*   HGB 9.8* 8.3*   PLT 109* 88*   WBC 9.3 5.4     No results found.    Reviewed lab results from the last 24 hrs, as well as CT scans, MRI and ultrasounds pertinent to my consultation.    assessment     Principal Problem:    Flexor tenosynovitis of finger  Active Problems:    Hypertension    Hepatitis C    Alcohol abuse    Opiate abuse, continuous (HCC)    HIV (human immunodeficiency virus infection) (HCC)    Cocaine abuse (HCC)    No active GI bleeding. Iron studies c/w iron deficiency so likely more chronic development. Having brown BM once per day. He is positive for cocaine and therefore is not eligible for sedation with GI endoscopy until off Cocaine for one week. So therefore , would place on protonix BID indeffinately, treat HIV, and will set up out patient follow up. He will have to drug test neg for cocaine prior to GI evaluation. No further  Recommendations at this point, GI will sign off.     PLAN     1. See above    Pain Management: Pain Management per Attending Provider.  Electronically signed by: Dante Gang, MD, 05/02/2022 10:45 AM    Please  note that some or all of this record was generated using voice recognition software. If there are any questions about the content of this document, please contact the author as some errors of  transcription may have occurred.  Electronically signed by Dante Gang, MD at 05/02/2022 10:54 AM EDT

## 2022-05-02 NOTE — Consults (Signed)
Associated Order(s): IP CONSULT TO PSYCHIATRY  Formatting of this note is different from the original.  Psychiatric Consultation:     Assessment:  Mr. Alexander [name] is a 36 year old male with past medical history significant for HCV, HIV, alcohol use disorder, opioid use disorder and depression who was admitted to [facility] due to concern for worsening finger infection.  Psychiatry was consulted due to alcohol use disorder and opioid withdrawal.  On assessment, patient is calm and cooperative.  He denies overt symptoms of depression or anxiety, denies suicidal ideation, homicidal ideation or hallucinations.  Notes ongoing alcohol and opioid withdrawal symptoms.  Receiving scheduled Valium and Ativan per CIWA.  Receiving buprenorphine as needed for pain.  Remains precontemplative regarding substance use.  He is however open to speaking with substance use team tomorrow.  Will continue to follow.    Active diagnoses  -Unspecified depressive disorder  -Alcohol use disorder  -Opioid use disorder    Recommendations:    -Patient reports history of depression however declines medication at this time, prefers to follow-up with his outpatient psychiatrist  -Agree with substance use consult  -Continue Valium 10 mg p.o. every 8 hours  -Continue CIWA  -Continue buprenorphine 2 mg as needed for pain    Thank you for the consultation    Chief Complaint: Opioid withdrawal    History of present illness:  Mr. Alexander [name] is a 36 year old male with past medical history significant for HCV, HIV, alcohol use disorder, opioid use disorder and depression who was admitted to [facility] due to concern for worsening finger infection.  Psychiatry was consulted due to alcohol use disorder and opioid withdrawal.  On assessment, patient is calm and cooperative.  Reports that he is currently living in a tent on her friend's property.  Has been using fentanyl and alcohol, most recent use was yesterday.  Reports 12-24 beers per day and 0.5 to 1 g of heroin  per day, typically injected.  History of complex alcohol withdrawal symptoms including seizures.  Reports ongoing withdrawal symptoms including anxiety, restlessness, diaphoresis, nausea and diarrhea.  He denies overt symptoms of depression or anxiety, denies suicidal ideation, homicidal ideation or hallucinations.  Notes ongoing alcohol and opioid withdrawal symptoms.  Receiving scheduled Valium and Ativan per CIWA.  Receiving buprenorphine as needed for pain.  Remains precontemplative regarding substance use.  He is however open to speaking with substance use team tomorrow.     PFSH: History of depression, opioid use disorder, alcohol use disorder.  No family psychiatric history.  Reports alcohol use and fentanyl use yesterday.    Review of Systems   Constitutional: Positive for chills, diaphoresis and fatigue. Negative for fever.   HENT: Positive for rhinorrhea. Negative for congestion and postnasal drip.    Eyes: Negative for photophobia, pain and redness.   Respiratory: Negative for chest tightness, shortness of breath and wheezing.    Cardiovascular: Negative for chest pain, palpitations and leg swelling.   Gastrointestinal: Positive for diarrhea. Negative for abdominal pain, constipation and nausea.   Genitourinary: Negative for dysuria, flank pain and frequency.   Musculoskeletal: Negative for arthralgias, gait problem and myalgias.   Skin: Negative for color change, pallor and rash.   Neurological: Negative for seizures, light-headedness, numbness and headaches.       Past psychiatric history:  Reports history of depression.  Not currently taking medication.  Denies previous hospitalizations or suicide attempts.  Has a psychiatrist but does not remember their name.    Family psychiatric history:  Family History  Problem Relation Age of Onset   ? Hypertension Mother    ? Seizures Father      Past Medical History:   Diagnosis Date   ? Hepatitis C    ? HIV (human immunodeficiency virus infection) (HCC)    ?  Hypertension      Past Surgical History:   Procedure Laterality Date   ? MANDIBLE SURGERY Left 2015     No Known Allergies    Medications:  ? nicotine  21 mg Transdermal Daily   ? amLODIPine  5 mg Oral Daily   ? iron sucrose  300 mg Intravenous Daily   ? pantoprazole  40 mg Intravenous BID AC   ? enoxaparin  40 mg Subcutaneous Daily   ? thiamine  100 mg Oral Daily   ? multivitamin w/ minerals  1 tablet Oral Daily   ? folic acid  1 mg Oral Daily   ? vancomycin  15 mg/kg Intravenous Q12H   ? vancomycin RX DOSED   Intravenous MAR PLACE HOLDER   ? insulin aspart  0-12 Units Subcutaneous Once   ? ceFEPIme  2 g Intravenous Q12H   ? diazePAM  10 mg Oral Q8H   ? lanolin-mineral oil   Topical BID     naloxone **OR** naloxone, hydrOXYzine Pamoate, dicyclomine, buprenorphine, oxycodone, sodium chloride, calcium carbonate, sennosides-docusate, polyethylene glycol, melatonin, benzonatate, sodium chloride 0.65%, nystatin, ondansetron, LORazepam **OR** LORazepam **OR** LORazepam, ondansetron, Elequil Aromatab - ORANGE-PEPPERMINT, mineral oil, hydrALAZINE, nicotine polacrilex    Substances: Reports daily alcohol use (approximately 12-24 beers per day).  Reports daily fentanyl use.    Social History: Patient is currently homeless, living in a tent on friend's property.. Denies legal history, denies access to firearms.    Vital Signs:BP 147/83   Pulse 75   Temp 97.4 F (36.3 C) (Oral)   Resp 20   Wt 140 lb 6.4 oz (63.7 kg)   SpO2 100%   BMI 20.73 kg/m     Mental status examination:   BP 147/83   Pulse 75   Temp 97.4 F (36.3 C) (Oral)   Resp 20   Wt 140 lb 6.4 oz (63.7 kg)   SpO2 100%   BMI 20.73 kg/m     Appearance - well-developed, appears stated age, poor eye contact  Orientation - alert and oriented to person, place, month, year and situation  Speech -normal rate and volume  Mood -uncomfortable affect - mood congruent  Thought processes/associations -goal-directed, normal associations  Thought content -no  suicidal nor homicidal ideation  Perceptions - no hallucinations nor delusions  Insight and judgment -limited to fair  Impulse control - intact at time of interview  Recent memory - intact; remote memory - intact  Attention and concentration - intact    Labs:   Recent Results (from the past 24 hour(s))   BMP (Na,K,Cl,CO2,Glu,BUN,Creat,Ca)    Collection Time: 05/02/22  3:35 AM   Result Value Ref Range    BLD UREA NITROGEN 11 8 - 26 mg/dL    SODIUM 578 469 - 629 mEq/L    POTASSIUM 3.4 (L) 3.6 - 5.1 mEq/L    CHLORIDE 106 98 - 111 mEq/L    CO2 24 21 - 31 mmol/L    GLUCOSE, RANDOM 92 70 - 99 mg/dL    CREATININE 5.28 (L) 0.70 - 1.30 mg/dL    ANION GAP 6 4 - 16 mmol/L    CALCIUM 7.3 (L) 8.5 - 10.4 mg/dL    ESTIMATED GFR 413 >24  mL/min/1.73 m2   CBC    Collection Time: 05/02/22  3:35 AM   Result Value Ref Range    WBC 5.4 3.6 - 10.5 THOU/mcL    RBC 4.05 (L) 4.40 - 5.80 MIL/mcL    HEMOGLOBIN 8.3 (L) 13.5 - 16.5 g/dL    HEMATOCRIT 16.1 (L) 40 - 50 %    MCV 65.6 (L) 82 - 97 fL    MCH 20.5 (L) 27 - 33 pg    MCHC 31.3 (L) 32 - 36 g/dL    RDW 09.6 (H) 04.5 - 17.0 %    PLATELET 88 (L) 140 - 375 THOU/mcL    MPV 8.7 7.4 - 11.5 fL   Magnesium Level    Collection Time: 05/02/22  3:35 AM   Result Value Ref Range    MAGNESIUM 1.5 (L) 1.7 - 2.4 mg/dL   Phosphorus Level    Collection Time: 05/02/22  3:35 AM   Result Value Ref Range    PHOSPHORUS 2.8 2.5 - 4.5 mg/dL   Iron Battery    Collection Time: 05/02/22  3:35 AM   Result Value Ref Range    IRON 10 (L) 50 - 212 mcg/dL    IRON BINDING CAP 409 (H) 260 - 450 mcg/dL    IRON SATURATION 2 (L) 15 - 55 %    TRANSFERRIN 325 203 - 362 mg/dL   Ferritin    Collection Time: 05/02/22  3:35 AM   Result Value Ref Range    FERRITIN 7 (L) 23.9 - 336.2 ng/mL   Vitamin B12    Collection Time: 05/02/22  3:35 AM   Result Value Ref Range    VITAMIN B12 274 180 - 914 pg/mL   Vancomycin Random 1    Collection Time: 05/02/22 10:00 AM   Result Value Ref Range    VANCOMYCIN RANDOM 1 13.2 5 - 40 mcg/mL      Glenetta Borg, MD    Please note that some or all of this record was generated using voice recognition software.  If there are any questions about the content of this document, please contact the author as some errors in transcription may have occurred.  Electronically signed by Glenetta Borg, MD at 05/02/2022  3:05 PM EDT

## 2022-05-02 NOTE — Progress Notes (Signed)
Formatting of this note might be different from the original.  CURRENT STATUS IS INPATIENT .     :    Any questions regarding PATIENT CLASS/STATUS should be directed to the Care Management Departments:  [facility] [phone] or [facility] [phone] or [facility]  [phone].  Electronically signed by Ebbie Latus, MD at 05/02/2022  9:25 AM EDT

## 2022-05-02 NOTE — Telephone Encounter (Signed)
Formatting of this note might be different from the original.  Message entered by Leotis Pain, RN   w/ [facility] Answering Service    Tomasita Crumble calling for GI consult request.    routine  Requesting physician: Dr. Sharlotte Alamo  Facility Name [facility]    Call back number: Marylene Land and Nurse Name: [phone]  Room #: [phone]  Main desk # [phone]    Admitting Diagnosis: Flexor Tenosynovitis of finger  Reason for consult request: melena iron deficiency anemia    GI provider(s) on call: Dr. Jill Side message sent to on call GI provider: Sent 05/02/22, 8:24 AM  Delivered 05/02/22, 8:24 AM  Read 05/02/22, 8:49 AM  Electronically signed by Rana Snare at 05/02/2022  9:05 AM EDT

## 2022-05-06 NOTE — Assessment & Plan Note (Signed)
Formatting of this note is different from the original.  Pt admitted with hand pain, known flexor tenosynovitis. Admitted 5/28-/5/29 and then left AMA. PMhx- Hepatitis C, HIV, Opiate, cocaine and alcohol abuse, HTN. Pt reports that he is homeless and lives in a tent. He has no PCP and declines a HARP consult. D/C dispo pending recs. Will need bus pass at discharge. CM to follow.    Patient admitted with   1. Flexor tenosynovitis of finger        Initial Care Management/Social Worker Assessment   Patient admitted from: Homeless and lives with Alone  Anticipated Discharge Plan:  (Pending recs)   Number of steps to enter home:      Home Care Options Discussed with Patient/Family: No  Do you have Diabetes?: No  Are you able to afford diabetic supplies?: N/A If no, what resources where offered?  Need for toileting asked to patient: Yes  Support system available at home: No   Who?: N/A   Amount of availability: N/A   Able to provide physical assistance (toileting/bathing/dressing)?: N/A  Potential Discharge Barriers: blood cultures, Zofran IV, Morphine IV, Maxipeme IV, Vancomycin IV, banana bag, clinical improvment, Substance use consult, Hand surgery consult  Primary family/friend contact & phone number:  Patient Contacts     Name Relation Home Work Mobile    [place] Sister   [phone]       Anticipated Discharge Date:   Anticipated Discharge Transportation provided by: bus pass  Preferred pharmacy:  [facility] [place], [place] - [address]  [phone]  [facility] [id number] [place], [place] - [address]  [phone]  Confirmed patient's preferred pharmacy: Yes  PCP name: Pcp, None, MD  PCP verified this visit: No  If NO PCP noted/Plans for follow up: N/A  Care Management handouts provided:   CM Brochure:  Yes  MOON letter if applicable:    IML if applicable:      CM or SW will follow: Jenkins Rouge, Registered Nurse    Flowsheet assessment:  Care  Manager/Social Worker Psychosocial Assessment    Date: 05/06/2022    Patient Name: Alexander [name] Date of Birth: 02/11/1986  Primary Care Physician: Pcp, None   Attending: Lucianne Lei., MD;Ra*    Info provided by: Chart;Patient  PCP Verified?: No (Declines HARP)  Does patient have decision making capability: Yes  Does patient have family member/DPOA to assist with decision making: No    Prior to Admission:  PCP Verified?: No (Declines HARP)  Type of Home: Homeless  Lives With: Alone  Patient is Caretaker: No  Has Patient Resided in ECF, [place], [place], Nursing Home or Hospital in Past 3 Months?: No  Level of Independence: Independent  Home Care Options Discussed with Patient/Family: No  Active with Home Care: No  Current DME: No  Active with Community Agency: No    Current:  Level of Consciousness: Alert;Appropriate responsiveness;Recognizes and responsive to caregivers  Orientation Level: Oriented X4  Cognition: Appropriate judgement;Appropriate safety awareness;Follows commands;Appropriate attention/concentration;Appropriate for developmental age    Discharge:  Who is requesting discharge planning?: Initial CM Assessment  Who will be picking you up at discharge?: Other (comment) (Unknown at this time)  Anticipated DC Plan:  (Pending recs)  Potential Discharge Barriers: blood cultures, Zofran IV, Morphine IV, Maxipeme IV, Vancomycin IV, banana bag, clinical improvment, Substance use consult, Hand surgery consult  Follow up appointment:  (N/A- No PCP, declines HARP)  Electronically signed by Jenkins Rouge, Registered Nurse at 05/06/2022  4:06 AM EDT

## 2022-05-06 NOTE — Nursing Note (Signed)
Formatting of this note might be different from the original.  Patient hand soak completed- rewrapped with gauze dressing. Patient tolerated well.   Electronically signed by Cherylynn Ridges at 05/06/2022 12:49 PM EDT

## 2022-05-06 NOTE — Progress Notes (Signed)
Formatting of this note might be different from the original.  Pharmacy note re:  Vancomycin Initial Consult    S:   Pharmacy asked to dose vancomycin by Dr. Sheilah Pigeon for treatment of flexor tenosynovitis first finger.    O:   Dosing Weight =  63.7 kg         Estimated Creatinine Clearance: 118 mL/min (A) (by C-G formula based on SCr of 0.52 mg/dL (L)).   Doses received in preceding 24 hours: None  There is a loading dose of 1250 mg order which isn't given yet.    A:   Patient qualifies for AUC:MIC dosing methodology.   Indication/severity of infection warrants a loading dose of 20 mg/kg.              Vanc AUC Goal: 500.    P:   Will give an initial dose of 1250 mg x 1, followed by a maintenance dose of  1000 mg every 8 hours based on current renal function.               After first dose, will obtain random serum concentrations Vanc AUC Times Around Doses: 4 & 7 hours after the 3rd dose to validate maintenance regimen.    Will assess two-point kinetics when results are available.  No change to dosing regimen until otherwise specified.   Will continue to follow and make adjustments to vancomycin therapy as necessary.    Jonnie Finner, PharmD  05/06/2022  Electronically signed by Jonnie Finner, PharmD at 05/06/2022  5:17 AM EDT

## 2022-05-06 NOTE — H&P (Signed)
Formatting of this note is different from the original.  Images from the original note were not included.      [facility] History & Physical  [facility]    Patient: Alexander [name] MRN: [id number]  Room/Bed:  9147W/29   Date of Birth: September 16, 1986  Age: 36 year old  Gender: male     Date of Service:  05/06/2022  Primary Care Physician:  Marisa Sprinkles, MD  Admitting Physician:  Fonda Kinder, MD    History OF PRESENT ILLNESS     Admissions in past 12 months:       2  Last hospitalization:                         05/01/2022 to 05/02/2022    Chief Complaint:   Left hand infection with pain.    History of Present Illness       Alexander [name] was seen and examined at the bedside. The patient is a(n) 36 year old male who is homeless [place] and has a background medical history of IV drug abuse and polysubstance abuse, HIV, hepatitis C, hypertension, iron deficiency anemia, anxiety, and GERD presents to ER with concerns for septic left flexor tenosynovitis of the index finger associated with pain.  Patient believes it all started when he thinks he was bitten by a spider.  Patient was recently admitted from 05/01/2022 for septic left flexor tenosynovitis of index finger and hand incision and drainage of flexor sheath of left index finger on 05/01/2022.  Patient unfortunately then went AMA on 05/02/2022.  Hand surgery was planning on going back to do another washout of left index finger before he left.  Patient admits to using IV fentanyl recently as well as drinking alcohol as recently as noon yesterday.  Patient otherwise denies chest pain, palpitations, shortness of breath, fever, chills, cough, sore throat, nausea, vomiting, diarrhea, constipation, abdominal pain, syncope, headaches, or urinary symptoms.    Past Medical History  Past Medical History:   Diagnosis Date   ? Hepatitis C    ? HIV (human immunodeficiency virus infection) (HCC)    ? Hypertension      Past Surgical History    Past  Surgical History:   Procedure Laterality Date   ? MANDIBLE SURGERY Left 2015     Current Medications  Prior to Admission medications    Medication Sig Start Date End Date Taking? Authorizing Provider   ferrous sulfate 325 (65 FE) MG TABS Take 1 tablet by mouth daily with dinner. 05/02/22   Blair Dolphin, MD   pantoprazole DR (PROTONIX) 40 MG TBEC Take 1 tablet by mouth every morning before breakfast. Indications: Stomach Ulcer 05/02/22   Blair Dolphin, MD   linezolid (ZYVOX) 600 MG TABS Take 1 tablet by mouth 2 (two) times daily for 14 days. Indications: Cellulitis (skin soft tissue, wound infections) 05/02/22 05/16/22  Blair Dolphin, MD   hydrOXYzine Pamoate (VISTARIL) 25 mg capsule Take 1-2 capsules by mouth every 8 (eight) hours as needed. 05/11/21   Virgina Jock., MD   folic acid (FOLVITE) 1 MG TABS Take 1 tablet by mouth daily. 05/12/21   Virgina Jock., MD   multivitamin w/ minerals (THERA-M) TABS Take 1 tablet by mouth daily. 05/12/21   Virgina Jock., MD   metoprolol tartrate (LOPRESSOR) 25 MG TABS Take 25 mg by mouth 2 (two) times daily.    HISTORICAL MED   ondansetron (ZOFRAN-ODT)  4 MG TBDP Take 1 tablet by mouth every 8 (eight) hours as needed. 06/17/20   Schindewolf, Bayard Beaver, PA-C   TRIUMEQ 600-50-300 MG TABS Take 1 tablet by mouth daily. 02/21/20   HISTORICAL MED     Allergies  No Known Allergies    Social History  Patient  reports that he has been smoking cigarettes. He has a 20.00 pack-year smoking history. He has never used smokeless tobacco. He reports current alcohol use of about 24.0 standard drinks of alcohol per week. He reports current drug use. Drugs: Heroin, IV, and Fentanyl.    Family History  Patient's family history includes Hypertension in his mother; Seizures in his father.    ROS: See HPI for further details.   All other systems reviewed and negative.     Pain:  Patient?s current pain level and pain goals reviewed, treatment options discussed.   Pain Care Plan  established.    PHYSICAL EXAM     Temp:  [98.8 F (37.1 C)-99.8 F (37.7 C)] 99.8 F (37.7 C)  Pulse:  [107-109] 107  Resp:  [16] 16  BP: (140-163)/(90-106) 163/90  SpO2:  [99 %-100 %] 99 %  O2 Device: None (Room air)  O2 Flow Rate (L/min): --  O2 (%): --  Estimated body mass index is 20.73 kg/m as calculated from the following:    Height as of 04/28/20: 69 (175.3 cm).    Weight as of 05/01/22: 140 lb 6.4 oz (63.7 kg).    Physical Exam-   Constitutional:  No acute distress, non-toxic appearance   HEENT:  Normocephalic, atraumatic, oropharynx moist, no oral exudates   Neck:  Normal range of motion, no tenderness, supple  Cardiovascular:   Normal heart rate, normal rhythm, no murmurs, no rubs, no gallops  Respiratory:  Normal breath sounds, no respiratory distress, No wheezing  GI:  Bowel sounds normal, soft, no tenderness, no masses   Extremities:  Intact distal pulses, no cyanosis, clubbing or edema  Skin:  Warm and dry  Musculoskeletal:    Left index finger septic flexor tenosynovitis with mild to moderate edema, erythema, and tenderness and please see photo in chart  Neurologic:  Normal motor function, normal sensory function, no focal deficits noted.  Psychiatric: Benign affect, normal mood.    Labs/Cultures:   Recent Results (from the past 24 hour(s))   ECG 12 lead    Collection Time: 05/06/22  1:55 AM   Result Value Ref Range    Height  in    HEART RATE 98 bpm    RR INTERVAL 612 ms    ATRIAL RATE 98 ms    P-R Interval 113 ms    P Duration 97 ms    P HORIZONTAL 36 deg    P FRONT AXIS 71 deg    Q Onset 507 ms    QRSD Interval 87 ms    QT INTERVAL 333 ms    QTCB 426 ms    QTcF 392 ms    QRS Horizontal Axis -32 deg    QRS AXIS 54 deg    I-40 Horizontal Axis  deg    I-40 FRONT AXIS  deg    T-40 Horizontal Axis -53 deg    T-40 Front Axis 25 deg    T Horizontal Axis 88 deg    T WAVE AXIS 10 deg    S-T Horizontal Axis 119 deg    S-T Front Axis -54 deg    ECG IMPRESSION - NORMAL ECG -  ECG IMPRESSION SR-Sinus  rhythm-normal P axis, V-rate 50-99     ECGGUID [id number]    BAMP (Na,K,Cl,CO2,Glu,BUN,Creat,Ca)    Collection Time: 05/06/22  2:08 AM   Result Value Ref Range    BLD UREA NITROGEN 7 (L) 8 - 26 mg/dL    SODIUM 629 (L) 528 - 145 mEq/L    POTASSIUM 3.8 3.6 - 5.1 mEq/L    CHLORIDE 99 98 - 111 mEq/L    CO2 27 21 - 31 mmol/L    GLUCOSE, RANDOM 127 (H) 70 - 99 mg/dL    CREATININE 4.13 (L) 0.70 - 1.30 mg/dL    ANION GAP 8 4 - 16 mmol/L    CALCIUM 8.5 8.5 - 10.4 mg/dL    ESTIMATED GFR 244 >01 mL/min/1.73 m2   CBC w/ Diff    Collection Time: 05/06/22  2:08 AM   Result Value Ref Range    WBC 6.8 3.6 - 10.5 THOU/mcL    RBC 4.39 (L) 4.40 - 5.80 MIL/mcL    HEMOGLOBIN 9.1 (L) 13.5 - 16.5 g/dL    HEMATOCRIT 02.7 (L) 40 - 50 %    MCV 66.2 (L) 82 - 97 fL    MCH 20.7 (L) 27 - 33 pg    MCHC 31.3 (L) 32 - 36 g/dL    RDW 25.3 (H) 66.4 - 17.0 %    PLATELET 129 (L) 140 - 375 THOU/mcL    MPV 9.4 7.4 - 11.5 fL    ABS. NEUTROPHIL 5.10 1.80 - 7.70 THOU/mcL    ABS LYMPHS 0.90 (L) 1.00 - 4.00 THOU/mcL    ABS MONOS 0.80 0.20 - 0.90 THOU/mcL    ABS EOS 0.00 (L) 0.03 - 0.45 THOU/mcL    ABS BASOS 0.00 0.00 - 0.20 THOU/mcL    SEGS 74 %    LYMPHOCYTES 13 %    MONOCYTES 11 %    EOSINOPHIL 1 %    BASOPHILS 1 %    RBC MORPHOLOGY       Unremarkable  Tested at [facility] [address] [address]    C-Reactive Protein    Collection Time: 05/06/22  2:08 AM   Result Value Ref Range    C-REACTIVE PROTEIN 13.00 (H) <5.0 mg/L   Initial Lactate (Lactic Acid)    Collection Time: 05/06/22  2:08 AM   Result Value Ref Range    LACTATE, INITIAL 1.3 0.5 - 2.0 mmol/L   Culture Blood Adult - First Set    Collection Time: 05/06/22  2:08 AM    Specimen: Blood (Peripheral Site 1)   Result Value Ref Range    SPECIMEN DESCRIPTION BLOOD (SITE 1 PERIPHERAL)     SPECIMEN TYPE Blood     CULTURE RESULTS       This culture has been examined but additional incubation is required.    MICRONOTE       Tested at [facility] [address] [id number]    REPORT STATUS PENDING    Culture Blood Adult - Second Set    Collection Time: 05/06/22  2:08 AM    Specimen: Blood (Peripheral Site 2)   Result Value Ref Range    SPECIMEN DESCRIPTION BLOOD (SITE 2 PERIPHERAL)     SPECIMEN TYPE Blood     CULTURE RESULTS       This culture has been examined but additional incubation is required.    MICRONOTE       Tested at [facility] [address] [phone]  REPORT STATUS PENDING    Ethanol Level    Collection Time: 05/06/22  2:08 AM   Result Value Ref Range    ETHANOL, SERUM 96 (H) <10.0 mg/dL     Imaging & OTHER STUDIES     XR CHEST AP PORTABLE    Result Date: 05/06/2022  No significant abnormality     I have reviewed the results and agree with above.    EKG reviewed and agree with reading.    Assessment     Principal Problem:    Flexor tenosynovitis of finger  Active Problems:    Hypertension    Hepatitis C    Alcohol abuse    Opiate abuse, continuous (HCC)    HIV (human immunodeficiency virus infection) (HCC)    Cocaine abuse (HCC)    Iron deficiency anemia    Chronic gastric ulcer without hemorrhage and without perforation    PLAN     1.  Septic flexor tendinitis of left index finger:  -I will continue IV cefepime and vancomycin started by ER.  -I will consult hand surgery and infectious disease.  -Patient will remain n.p.o. for possible surgical washout this AM.  -Patient should not get any narcotics while here in hospital.  -Instead, I will order IV Toradol 15 mg every 6 hours as needed.    2.  Polysubstance abuse including opioids, cocaine, and alcohol:  -I will initiate CIWA protocol.  -I will order substance abuse counseling.    3.  HIV:  -I will restart his antiretroviral therapy which he is apparently noncompliant with.    4.  Hepatitis C infection:  -Patient is not sober and currently not a candidate for treatment.    5.  Hypertension:  -I will continue patient's metoprolol titrate.    6.  Likely chronic iron deficiency anemia from possible  gastritis or gastric ulcer:-I will continu  -I will continue patient's iron supplementation.  -I will continue Protonix 40 mg p.o. twice daily as recommended by GI.    7.  Patient is low risk for DVT prophylaxis.    8.  I will order appropriate a.m. labs for tomorrow morning.    9.  I discussed my impression and plan with patient and they would not be getting IV narcotics to treat his pain while admitted to the hospital and patient agrees with treatment plan.    DVT prophylaxis:  Low risk, Ambulate    Total time spent on patient evaluation and management was 45 minutes.       Electronically signed by: Fonda Kinder, MD, 05/06/2022 5:49 AM    Please note that some or all of this record was generated using voice recognition software. If there are any questions about the content of this document, please contact the author as some errors of transcription may have occurred.    Electronically signed by Fonda Kinder), MD at 05/06/2022  5:49 AM EDT

## 2022-05-06 NOTE — Progress Notes (Signed)
Formatting of this note might be different from the original.  Pharmacy Consult - Pharmacy to Dose for Renal Function    Cefepime was requested by Dr. Sheilah Pigeon to be adjusted by pharmacy based on renal function.     Based on a CrCl of Estimated Creatinine Clearance: 118 mL/min (A) (by C-G formula based on SCr of 0.52 mg/dL (L)). the following is recommended: 2 g Q12H    Indication: flexor tenosynovitis first finger    Pharmacy will continue to monitor and adjust based on fluctuating renal function for the total duration of medication order.     Thanks,  Jonnie Finner, PharmD  05/06/2022  [facility] - [phone]    Electronically signed by Jonnie Finner, PharmD at 05/06/2022  5:21 AM EDT

## 2022-05-06 NOTE — Progress Notes (Signed)
Formatting of this note is different from the original.  Images from the original note were not included.      TRIHEALTH PROGRESS NOTE  [facility]    Patient: Alexander [name] MRN: [id number]  Room/Bed:  6301S/01   Date of Birth: November 30, 1986  Age: 36 year old  Gender: male      Date of Service   05/06/2022       SUBJECTIVE     Mildred Strosnider was seen and examined.  The patient is a 36 year old male who was admitted on 05/06/2022 with L finger/hand cellulitis.    Vitals and labs reviewed  Patient states the pain is well controlled.  He denies drainage from his finger.  He notes that fever/chills have resolved.    ROS:  Respiratory:  no shortness of breath  Cardiovascular:  no chest pain or palpitations  Gastrointestinal:  no nausea, vomiting    Pain:  Patient?s current pain level and Pain Care Plan reviewed.   Patient?s current pain status is: pain is adequately controlled, continue with current plan    OBJECTIVE     PHYSICAL EXAM  Temp:  [98.8 F (37.1 C)-99.8 F (37.7 C)] 99.1 F (37.3 C)  Pulse:  [102-109] 102  Resp:  [16-18] 18  BP: (140-163)/(84-106) 150/84  SpO2:  [99 %-100 %] 100 %  O2 Device: None (Room air)  O2 Flow Rate (L/min): --  O2 (%): --  Estimated body mass index is 20.73 kg/m as calculated from the following:    Height as of 04/28/20: 69 (175.3 cm).    Weight as of 05/01/22: 140 lb 6.4 oz (63.7 kg).    Constitutional: Caucasian male, chronic appearing, no acute distress, non-toxic appearance   Cardiovascular:   Normal heart rate, normal rhythm, no murmurs, no rubs, no gallops  Respiratory:  Normal breath sounds, no respiratory distress, no wheezing  GI:  Bowel sounds normal, soft, no tenderness, no masses   Extremities: Left hand wrapped, intact distal pulses, no edema, no tenderness  Neurologic:  Normal motor function, normal sensory function, no focal deficits noted  Skin:  Warm and dry  Psychiatric: Benign affect, normal mood.    Intake and Output:  No intake or output data  in the 24 hours ending 05/06/22 0736                            LDAs:     Patient Lines/Drains/Airways Status     Active LINES/DRAINS/AIRWAYS     Name Placement date Placement time Site Days    Wound/Skin Exceptions 05/05/22 Finger (Comment which one) Anterior;Left 05/05/22  0508  Finger (Comment which one)  1           Labs/Cultures:   Recent Labs     05/06/22  0208   HCT 29.1*   HGB 9.1*   PLT 129*   WBC 6.8       Recent Labs     05/06/22  0208 05/06/22  0600   BUN 7*  --    CA 8.5  --    CL 99  --    CO2 27  --    CRET 0.52* 0.47*   GLU 127*  --    K 3.8  --    MG  --  1.5*   NA 134*  --      No results for input(s): ALB, ALKP, ALT, AST, CBIL, TBIL in the  last 72 hours.    No results for input(s): INR in the last 72 hours.    Blood sugars:  No results found for: NPGLU    Imaging & OTHER STUDIES     XR CHEST AP PORTABLE    Result Date: 05/06/2022  No significant abnormality       I have reviewed the results     Assessment     Principal Problem:    Flexor tenosynovitis of finger  Active Problems:    Hypertension    Hepatitis C    Alcohol abuse    Opiate abuse, continuous (HCC)    HIV (human immunodeficiency virus infection) (HCC)    Cocaine abuse (HCC)    Iron deficiency anemia    Chronic gastric ulcer without hemorrhage and without perforation                PLAN     1.  Left index finger cellulitis/flexor tendinitis  -Continue cefepime/vancomycin.  Vancomycin trough reviewed, appreciate pharmacy assistance.  -Surgery consulted, appreciate recs, NPO for possible washout  -Infectious disease consulted, appreciate recs  -Multimodal pain control  2.  Polysubstance abuse  -Opioids, cocaine, alcohol  -Substance abuse team consulted  -CIWA protocol, as needed Ativan  3.  HIV-antiretroviral therapy  4.  Hepatitis C-not a candidate for treatment due to ongoing IV drug use  5.  Hypertension-continue metoprolol  6.  Anemia of chronic disease  -Secondary to HIV, cellulitis  -Continue iron supplementation    DVT  prophylaxis: Low risk, Ambulate  Anticipated date of discharge: ELOS: DC 2-3 days  Anticipated venue at discharge: Home  Barriers to discharge: IV abx, Surgical intervention    Electronically signed by: Clayborn Bigness, DO, 05/06/2022 7:36 AM    Please note that some or all of this record was generated using voice recognition software. If there are any questions about the content of this document, please contact the author as some errors of transcription may have occurred.     DURING THE DAY: From 8:30 to 5pm, for patient needs, please contact the listed attending physician in Epic or [place].   AFTER HOURS: From 5pm to 8:30am, for urgent needs, please contact the cross cover hospitalist for your hospital in [place].  Electronically signed by Clayborn Bigness., DO at 05/06/2022  9:26 AM EDT

## 2022-05-06 NOTE — Consults (Signed)
Associated Order(s): Inpatient consult to Infectious Diseases  Formatting of this note is different from the original.  Images from the original note were not included.    Infectious diseases consult note  [facility]     Patient: Alexander [name] MRN: [id number]  Room/Bed:  5176H/60   Date of Birth: 26-Jan-1986  Age: 36 year old  Gender: male      Date of Service:  05/06/2022  Admitting Physician: Fonda Kinder     Attending Physician: Clayborn Bigness., DO  Primary Care Physician:  Marisa Sprinkles, MD    Date of Admission:  05/06/2022  Hospital length of stay:  0    Reason for Consult:   Alexander [name] is being seen at the request of RAE, ROBERT SEAN for Septic flexor tenosynovitis of left index finger    Inpatient consult to Infectious Diseases  Consult performed by: Dawayne Patricia, MD  Consult ordered by: Fonda Kinder, MD    Chief Complaint: Septic flexor tenosynovitis of left index finger    History of Present Illness: Alexander [name] is a 36 year old male with a PMH of HIV noncompliant with ART, HCV, polysubstance abuse, IVDA, and homelessness currently [place] the woods who was recently admitted to the hospital for septic left flexor tenosynovitis of index finger for which he underwent I&D with surgery on 05/01/2022 but patient left AMA on 05/02/2022 and was prescribed linezolid empirically which he never started as there was no prescription at his pharmacy.  Patient returned to the ER on 05/06/2022 as he wants to resume care for his left index infection due to worsening left index pain.   Patient denies fever.  He has been taking his Triumeq for the last month.  He follows with Dr. Manson Passey at [facility] but he has not been there for over a year now.  He continues to struggle with IVDA.  He uses his upper extremities for that (not fingers).    Past Medical History:   Diagnosis Date   ? Hepatitis C    ? HIV (human immunodeficiency virus infection) (HCC)    ? Hypertension      Past Surgical  History:   Procedure Laterality Date   ? MANDIBLE SURGERY Left 2015     Social History     Tobacco Use   ? Smoking status: Every Day     Packs/day: 1.00     Years: 20.00     Total pack years: 20.00     Types: Cigarettes   ? Smokeless tobacco: Never   Substance Use Topics   ? Alcohol use: Yes     Alcohol/week: 24.0 standard drinks of alcohol     Types: 24 Cans of beer per week     Comment: DAILY       Family History   Problem Relation Age of Onset   ? Hypertension Mother    ? Seizures Father      No Known Allergies    Medications:    Scheduled Medications:   ? (*) MED SURGE Electrolyte Replacement Orders   Other MAR PLACE HOLDER   ? ferrous sulfate  325 mg Oral Daily with dinner   ? folic acid  1 mg Oral Daily   ? metoprolol tartrate  25 mg Oral BID   ? multivitamin w/ minerals  1 tablet Oral Daily   ? vancomycin RX DOSED   Intravenous MAR PLACE HOLDER   ? ceFEPIme  2 g  Intravenous q12h   ? vancomycin  1,000 mg Intravenous Q8H   ? pantoprazole DR  40 mg Oral BID AC   ? abacavir  600 mg Oral Daily   ? dolutegravir  50 mg Oral Daily   ? lamiVUDine  300 mg Oral Daily   ? magnesium sulfate  1 g Intravenous Once     PRN Medications:  sodium chloride, acetaminophen **OR** acetaminophen, sennosides-docusate, polyethylene glycol, melatonin, benzonatate, sodium chloride 0.65%, nystatin, ondansetron, hydrOXYzine Pamoate, LORazepam **OR** LORazepam **OR** LORazepam, ketorolac    Continuous Infusions:      Review of Systems   Constitutional: Negative for fatigue and fever.   Respiratory: Negative for cough.    Gastrointestinal: Negative for abdominal pain.   Musculoskeletal: Negative for back pain.   Neurological: Negative for headaches.     Pain Management: Pain Management per Attending Provider.      PHYSICAL EXAM     Vitals:   Temp:  [98.8 F (37.1 C)-99.8 F (37.7 C)] 99.1 F (37.3 C)  Pulse:  [102-109] 102  Resp:  [16-18] 18  BP: (140-163)/(84-106) 150/84  SpO2:  [99 %-100 %] 100 %  O2 Device: None (Room air)  O2 Flow  Rate (L/min): --  O2 (%): --     Physical Exam   Constitutional: He is oriented to person, place, and time. He appears well-developed and well-nourished.   HENT:   Head: Normocephalic and atraumatic.   Eyes: No scleral icterus.   Pulmonary/Chest: Effort normal. No respiratory distress.   Abdominal: He exhibits no distension.   Musculoskeletal:      Comments: Left index finger wrapped with dressing.  Track marks noted in  forearms.   Neurological: He is alert and oriented to person, place, and time. No cranial nerve deficit.   Skin: No rash noted. He is not diaphoretic.     Patient Lines/Drains/Airways Status     Active LINES/DRAINS/AIRWAYS     Name Placement date Placement time Site Days    Wound/Skin Exceptions 05/05/22 Finger (Comment which one) Anterior;Left 05/05/22  0508  Finger (Comment which one)  1           Labs/Cultures:  Recent Labs     05/06/22  0208   HCT 29.1*   HGB 9.1*   MCV 66.2*   PLT 129*   WBC 6.8     Recent Labs     05/06/22  0208 05/06/22  0600   AGAP 8  --    BUN 7*  --    CA 8.5  --    CL 99  --    CO2 27  --    CRET 0.52* 0.47*   GLU 127*  --    K 3.8  --    MG  --  1.5*   NA 134*  --      No results found for: VANCT  All lab results for the last 24 hours reviewed.  Lab Results   Component Value Date    CULT  05/06/2022     This culture has been examined but additional incubation is required.    CULT  05/06/2022     Tested at [facility] [address] [phone]    CULT  05/06/2022     This culture has been examined but additional incubation is required.    CULT  05/06/2022     Tested at [facility] [address] [phone]    CULT No anaerobic growth at 5 days.  05/01/2022    CULT  05/01/2022     Tested at: [facility] [address] [employer] [address]    CULT Positive Growth 05/01/2022    CULT METHICILLIN RESISTANT STAPHYLOCOCCUS AUREUS 05/01/2022    CULT  05/01/2022     (NOTE) Abundant growth of Methicillin Resistant Staphylococcus aureus PBP2a: a rapid assay  that aids in identifying MRSA by detecting the protein that confers resistance to methicillin. A negative result does not exclude methicillin resistance due to   other mechanisms. METHICILLIN-RESISTANT STAPHYLOCOCCUS AUREUS PBP2A Positive Tested at: [facility] [address]     CULT  09/81/1914     Tested at [facility] [address] [facility] [phone]     IMAGING & OTHER STUDIES     Imaging:  XR CHEST AP PORTABLE    Result Date: 05/06/2022  No significant abnormality     Principal Problem:    Flexor tenosynovitis of finger  Active Problems:    Iron deficiency anemia    Hypertension    Hepatitis C    Alcohol abuse    Opiate abuse, continuous (HCC)    HIV (human immunodeficiency virus infection) (HCC)    Cocaine abuse (HCC)    Chronic gastric ulcer without hemorrhage and without perforation    Assessment   Alexander [name] is a 36 year old male admitted on 05/06/2022 with:    #Left index finger tenosynovitis:  S/p spider bite.  -s/p I/D of flexor sheath left index finger with surgery on 5/29.    Culture: MRSA; susceptibilities reviewed.  -Left AMA on 05/02/2022 (he could not fill linezolid as outpatient).  -Return to the hospital 6/2.  Evaluated by plastic surgery with no I&D planned.    #Active polysubstance abuse/IVDA.  #Alcohol use disorder.    #Chronic Hep C:  -HCV NAAT 949K.    #HIV:  -04/24/2022: HIV VL undetected.  CD4 389/35%.  -Has been taking his Triumeq for the last month.  -CD4: 389/35%    #Therapeutic drug monitoring:  Estimated Creatinine Clearance: 118 mL/min (A) (by C-G formula based on SCr of 0.47 mg/dL (L)).    PLAN   -Start daptomycin 6 mg/kg.  CPK at baseline and then weekly while on daptomycin.  -No I&D planned per surgery.  -Follow BCx.  -Continue Triumeq.  -Patient was advised to follow-up with ID at [facility] after discharge (to discuss HIV and Hep C treatment).  -Can eventually discharge on Bactrim PO 1 DS twice daily.  EOT: 05/19/2022.  -High risk for leaving  AMA.    Dawayne Patricia, MD  [facility]    Electronically signed by: Dawayne Patricia, MD, 05/06/2022 9:53 AM    Please note that some or all of this record may have been generated using voice recognition software. If there are any questions about the content of this document, please contact the author as some errors of transcription may have occurred.  Electronically signed by Dawayne Patricia at 05/06/2022 11:16 AM EDT

## 2022-05-06 NOTE — Assessment & Plan Note (Signed)
Formatting of this note is different from the original.  [facility] team met with Alexander [name] to discuss [facility] DC planning and linkage to care. Alexander [name] reported an extensive Hx of SU, DOC: ETOH, Opioids and cocaine. Alexander [name] stated that him and a friend has plans once he is DC. Alexander [name] was unable to clearly explain his plain but it seems to be to leave the area and enter into a sober living and engage in MAT services. Alexander [name] did request options locals that provide housing and are MAT/Methadone friendly. Writer discuss options for PHP with [facility] at the [facility] [place]. At [facility] Alexander [name] can be on methadone and as long as he engages in the day treatment program his medicaid will cover his housing cost. Alexander [name] took brochure on program and stated that he would consider it as an option. Alexander [name] agreed to be followed by [facility] team during admission.     [facility] [place]  Main office: [address]  Contact person: Oralia Rud  Phone: [phone]      05/06/22 1151   Discharge Planning   Level of Care Facility Type Residential  (PHP with [facility])   LOC Destination other (see comments)  ([facility] [place])       Electronically signed by Sabino Donovan, CDCA at 05/06/2022 11:59 AM EDT

## 2022-05-06 NOTE — ED Provider Notes (Signed)
Formatting of this note is different from the original.  Images from the original note were not included.  [facility]  ED Encounter Arrival Date: 05/06/22 0119  Jamard Harpin                            DOB: 09/17/1986  [facility]  [place] [address]   MRN: [id number]   CSN: [id number]     HAR: [id number]     EMERGENCY DEPARTMENT ENCOUNTER      Please note that some or all of this record was generated using voice recognition software. If there are any questions about the content of this document, please contact the author as some errors in transcription may have occurred.   CHIEF COMPLAINT    Chief Complaint   Patient presents with   ? Post-op Problem   ? Hand Pain     HPI    Alexander [name] is a 36 year old male who presents to continue treatment for his flexor tenosynovitis.  The original injury is not completely clear, but he thinks he may have been bitten by something on the finger.  He had surgery and they were planning to go when and do further washout and surgery, but he left AGAINST MEDICAL ADVICE.  He says he is an alcoholic and drug addict and left to drinking his drugs.  He is now returning because he wants to continue care.  He is having pain in the finger and swelling.  He denies any fevers or chills.  He says he is homeless and he has been using fentanyl intravenously as well as alcohol.  His last drink was around noon.  He denies any chest pain, trouble breathing, abdominal pain, or vomiting.  He does have a hernia in the right inguinal region that has been there for a long time as well as some other chronic rashes, but no other acute concerns.     REVIEW OF SYSTEMS as in the HPI    PAST MEDICAL HISTORY    Past Medical History:   Diagnosis Date   ? Hepatitis C    ? HIV (human immunodeficiency virus infection) (HCC)    ? Hypertension      SURGICAL HISTORY    Past Surgical History:   Procedure Laterality Date   ? MANDIBLE SURGERY Left 2015      CURRENT MEDICATIONS    Prior to Admission medications    Medication Sig Start Date End Date Taking? Authorizing Provider   ferrous sulfate 325 (65 FE) MG TABS Take 1 tablet by mouth daily with dinner. 05/02/22   Blair Dolphin, MD   pantoprazole DR (PROTONIX) 40 MG TBEC Take 1 tablet by mouth every morning before breakfast. Indications: Stomach Ulcer 05/02/22   Blair Dolphin, MD   linezolid (ZYVOX) 600 MG TABS Take 1 tablet by mouth 2 (two) times daily for 14 days. Indications: Cellulitis (skin soft tissue, wound infections) 05/02/22 05/16/22  Blair Dolphin, MD   hydrOXYzine Pamoate (VISTARIL) 25 mg capsule Take 1-2 capsules by mouth every 8 (eight) hours as needed. 05/11/21   Virgina Jock., MD   folic acid (FOLVITE) 1 MG TABS Take 1 tablet by mouth daily. 05/12/21   Virgina Jock., MD   multivitamin w/ minerals (THERA-M) TABS Take 1 tablet by mouth daily. 05/12/21   Virgina Jock., MD   metoprolol tartrate (LOPRESSOR) 25 MG TABS Take 25 mg by mouth 2 (  two) times daily.    HISTORICAL MED   ondansetron (ZOFRAN-ODT) 4 MG TBDP Take 1 tablet by mouth every 8 (eight) hours as needed. 06/17/20   Schindewolf, Bayard Beaver, PA-C   TRIUMEQ 600-50-300 MG TABS Take 1 tablet by mouth daily. 02/21/20   HISTORICAL MED     ALLERGIES    No Known Allergies    FAMILY HISTORY    Family History   Problem Relation Age of Onset   ? Hypertension Mother    ? Seizures Father      SOCIAL HISTORY    Social History     Socioeconomic History   ? Marital status: Single   Tobacco Use   ? Smoking status: Every Day     Packs/day: 1.00     Years: 20.00     Total pack years: 20.00     Types: Cigarettes   ? Smokeless tobacco: Never   Vaping Use   ? Vaping Use: Never used   Substance and Sexual Activity   ? Alcohol use: Yes     Alcohol/week: 24.0 standard drinks of alcohol     Types: 24 Cans of beer per week     Comment: DAILY   ? Drug use: Yes     Types: Heroin, IV, Fentanyl     Comment: Last used 6/1     PHYSICAL EXAM    VITAL  SIGNS: BP (!) 140/95   Pulse 109   Temp 98.8 F (37.1 C) (Oral)   Resp 16   SpO2 100%   Constitutional:  Cooperative, no acute distress  Eyes: No scleral icterus, no conjunctival injection, PERRL  HENT:  Atraumatic, normal pinnae, nose normal, oropharynx moist.    Lymphatic: no cervical lymphadenopathy  Respiratory:  No respiratory distress, normal breath sounds, no wheezes, crackles, or rhonchi.   Cardiovascular:  Normal rate, normal rhythm, no murmurs, 2+ Bilat radial pulses  GI:  Soft, nondistended, nontender; no rigidity, rebound, guarding, or CVA tenderness   Musculoskeletal:  No calf tenderness or edema  Integument: Surgical site on left index finger with no active drainage, some areas that are sutured and others that are open, minimal surrounding erythema.  Please see picture below.  Patient also has a healing lesion on his left leg which she says was from a dog bite 6 months ago.  He also has erythema and scaling of the lower abdomen  Neurologic:  Alert and oriented, intact strength and sensation in all extremities    EKG    Sinus rhythm with a rate of 98.  Normal intervals.  No ST segment elevations.  No T wave inversions.  No Q waves.  No significant changes compared to the old EKG from May 10, 2021.  Today's EKG was interpreted by me  Independently interpreted by Kasey L. Kandee Keen, MD    LABORATORY  Recent Results (from the past 24 hour(s))   ECG 12 lead    Collection Time: 05/06/22  1:55 AM   Result Value Ref Range    Height  in    HEART RATE 98 bpm    RR INTERVAL 612 ms    ATRIAL RATE 98 ms    P-R Interval 113 ms    P Duration 97 ms    P HORIZONTAL 36 deg    P FRONT AXIS 71 deg    Q Onset 507 ms    QRSD Interval 87 ms    QT INTERVAL 333 ms    QTCB 426 ms  QTcF 392 ms    QRS Horizontal Axis -32 deg    QRS AXIS 54 deg    I-40 Horizontal Axis  deg    I-40 FRONT AXIS  deg    T-40 Horizontal Axis -53 deg    T-40 Front Axis 25 deg    T Horizontal Axis 88 deg    T WAVE AXIS 10 deg    S-T Horizontal Axis  119 deg    S-T Front Axis -54 deg    ECG IMPRESSION - NORMAL ECG -     ECG IMPRESSION SR-Sinus rhythm-normal P axis, V-rate 50-99     ECGGUID [id number]    BAMP (Na,K,Cl,CO2,Glu,BUN,Creat,Ca)    Collection Time: 05/06/22  2:08 AM   Result Value Ref Range    BLD UREA NITROGEN 7 (L) 8 - 26 mg/dL    SODIUM 956 (L) 387 - 145 mEq/L    POTASSIUM 3.8 3.6 - 5.1 mEq/L    CHLORIDE 99 98 - 111 mEq/L    CO2 27 21 - 31 mmol/L    GLUCOSE, RANDOM 127 (H) 70 - 99 mg/dL    CREATININE 5.64 (L) 0.70 - 1.30 mg/dL    ANION GAP 8 4 - 16 mmol/L    CALCIUM 8.5 8.5 - 10.4 mg/dL    ESTIMATED GFR 332 >95 mL/min/1.73 m2   CBC w/ Diff    Collection Time: 05/06/22  2:08 AM   Result Value Ref Range    WBC 6.8 3.6 - 10.5 THOU/mcL    RBC 4.39 (L) 4.40 - 5.80 MIL/mcL    HEMOGLOBIN 9.1 (L) 13.5 - 16.5 g/dL    HEMATOCRIT 18.8 (L) 40 - 50 %    MCV 66.2 (L) 82 - 97 fL    MCH 20.7 (L) 27 - 33 pg    MCHC 31.3 (L) 32 - 36 g/dL    RDW 41.6 (H) 60.6 - 17.0 %    PLATELET 129 (L) 140 - 375 THOU/mcL    MPV 9.4 7.4 - 11.5 fL    ABS. NEUTROPHIL 5.10 1.80 - 7.70 THOU/mcL    ABS LYMPHS 0.90 (L) 1.00 - 4.00 THOU/mcL    ABS MONOS 0.80 0.20 - 0.90 THOU/mcL    ABS EOS 0.00 (L) 0.03 - 0.45 THOU/mcL    ABS BASOS 0.00 0.00 - 0.20 THOU/mcL    SEGS 74 %    LYMPHOCYTES 13 %    MONOCYTES 11 %    EOSINOPHIL 1 %    BASOPHILS 1 %    RBC MORPHOLOGY       Unremarkable  Tested at [facility] [address] [facility] [address]    C-Reactive Protein    Collection Time: 05/06/22  2:08 AM   Result Value Ref Range    C-REACTIVE PROTEIN 13.00 (H) <5.0 mg/L   Initial Lactate (Lactic Acid)    Collection Time: 05/06/22  2:08 AM   Result Value Ref Range    LACTATE, INITIAL 1.3 0.5 - 2.0 mmol/L   Ethanol Level    Collection Time: 05/06/22  2:08 AM   Result Value Ref Range    ETHANOL, SERUM 96 (H) <10.0 mg/dL     RADIOLOGY    XR CHEST AP PORTABLE   Final Result       No significant abnormality                                     ED  COURSE & MEDICAL DECISION MAKING        The patient was given the following medication in the Emergency department:    Medication Administration from 05/06/2022 0119 to 05/06/2022 0407     Date/Time Order Dose Route Action Action by Comments    05/06/2022 0250 EDT ceFEPIme (MAXIPIME) 2 g in sodium chloride 0.9 % 100 mL IVPB (RN MIX) 2 g Intravenous New Bag Bj Eder --    05/06/2022 0324 EDT ceFEPIme (MAXIPIME) 2 g in sodium chloride 0.9 % 100 mL IVPB (RN MIX) 0 g Intravenous Stopped Bj Eder --    05/06/2022 0250 EDT folic acid 1 mg, thiamine (B-1) 100 mg in sodium chloride 0.9 % 1,000 mL IVPB -- Intravenous New Bag Bj Eder --    05/06/2022 0239 EDT multivitamin w/ minerals (THERA-M) tablet 1 tablet 1 tablet Oral Given Bj Eder --    05/06/2022 0251 EDT morphine injection 4 mg 4 mg Intravenous Given Bj Eder --    05/06/2022 0251 EDT ondansetron (ZOFRAN) injection 4 mg 4 mg Intravenous Given Bj Eder --       Medical decision making: Jerramie Bendavid is a 36 year old male who presents with a hand infection.  I did review his old records including his discharge summary from Dr. Sharlotte Alamo from May 29.  He was supposed to be getting IV antibiotics but left AGAINST MEDICAL ADVICE at that time.  I did order labs and imaging.  I interpreted the labs independently and reviewed the radiologist interpretation of imaging.  I ordered a chest x-ray because he has been having cough, in addition to labs.  My differential includes flexor tenosynovitis, sepsis, additional infection with pneumonia given his cough, and he is also at high risk for alcohol withdrawal.  I did give him a rally pack.  CIWA score at this time is 1.  I did review his prior records to find out what antibiotics had been recommended, and he was supposed to be getting cefepime and vancomycin so I did order these.  He was also given a single dose of medication for pain here as well as nausea medicine.  Chest x-ray did not show any evidence of pneumonia.  White count was not elevated and lactic acid  was normal.  Basic metabolic panel showed only very minimal hyponatremia.  Patient does also have low platelet count and anemia.  Ethanol level is 96.  CRP is 13.  I contacted Dr. Leonia Corona from hand surgery, who wants the patient kept n.p.o. for possible surgery.  I also contacted the hospitalist who will admit the patient for further management.      Problems addressed:   1. Flexor tenosynovitis of finger      Associated Comorbidities:   Past Medical History:   Diagnosis Date   ? Hepatitis C    ? HIV (human immunodeficiency virus infection) (HCC)    ? Hypertension      Social determinants of health: Ongoing substance abuse complicating treatment of the infection including precipitating leaving AGAINST MEDICAL ADVICE prior to completing treatment, homelessness limiting care that can be provided as an outpatient  Past Medical History and Current Medications reviewed.    Tests ordered and results reviewed for this encounter: See Laboratory and Radiology sections above.  Prior external notes and results reviewed: See HPI and MDM narrative.  Independent historians: See HPI and MDM narrative.    Independent interpretation of tests: See EKG interpretation and MDM narrative.    Discussion of management or  test interpretation:  See MDM narrative.    Patient will be admitted to the hospital.  I have talked with Dr. Sheilah Pigeon , given the physician the history, physical, labs , plan and management.  He/She agreed with the plan set forth and will be admitted to  Med/Surg Unit.     Final Diagnosis:    1. Flexor tenosynovitis of finger        Lucianne Lei., MD  05/06/22 [phone]    Electronically signed by Lucianne Lei., MD at 05/06/2022  4:16 AM EDT

## 2022-05-06 NOTE — Consults (Signed)
Associated Order(s): IP CONSULT TO HAND SURGERY  Formatting of this note is different from the original.  Hand Surgery Consult Note    Patient: Alexander [name] MRN: [id number]     Date of Birth: 02-09-1986  Age: 36 year old  Sex: male    Unit: [id number] Room/Bed: 0781C/01 Location: [facility]     Admitting Physician: Fonda Kinder     Primary Care Physician: Marisa Sprinkles, MD    Date of Consultation: 05/06/2022    Chief Complaint/HPI: Alexander [name] is a 36 year old male who presents with for a left index finger flexor tenosynovitis. He was taken to the OR on 05/01/22 by by Hilliard Clark but the patient left AMA shortly thereafter. He returns today for evaluation of his index finger. Has a pmh of HIV, HCV, polysubstance abuse and homelessness.    PAST MEDICAL HISTORY:     PMH:   Past Medical History:   Diagnosis Date   ? Hepatitis C    ? HIV (human immunodeficiency virus infection) (HCC)    ? Hypertension      PSH:   Past Surgical History:   Procedure Laterality Date   ? MANDIBLE SURGERY Left 2015     ALLERGIES: No Known Allergies    CURRENT MEDICATIONS:   No current outpatient medications on file.     FAMILY HISTORY:   Family History   Problem Relation Age of Onset   ? Hypertension Mother    ? Seizures Father      SOCIAL HISTORY:   Social History     Socioeconomic History   ? Marital status: Single   Tobacco Use   ? Smoking status: Every Day     Packs/day: 1.00     Years: 20.00     Total pack years: 20.00     Types: Cigarettes   ? Smokeless tobacco: Never   Vaping Use   ? Vaping Use: Never used   Substance and Sexual Activity   ? Alcohol use: Yes     Alcohol/week: 24.0 standard drinks of alcohol     Types: 24 Cans of beer per week     Comment: DAILY   ? Drug use: Yes     Types: Heroin, IV, Fentanyl     Comment: Last used 6/1     ROS:   As mentioned per HPI, all other systems reviewed and otherwise negative.    PHYSICAL EXAMINATION:     Vital signs in last 24 hours:  Temp:  [98.8 F (37.1 C)-99.8  F (37.7 C)] 99.1 F (37.3 C)  Pulse:  [102-109] 102  Resp:  [16-18] 18  BP: (140-163)/(84-106) 150/84    General: Well-nourished, no acute distress, normal affect, conversant and oriented    Upper Extremity Overview:   Normal alignment of the elbows, wrists and digits.    Inspection: Incisions present to left index finger, no purulent drainage, appropriate swelling, minimal erythema    Palpation: Appropriately tender to palpation     ROM: Limited by swelling    Sensation: SLTI ntact in a median, ulnar and radial nerve distribution    Vascular: cap refill < 3 seconds    Other Provocative Tests and Abnormal Findings:  None    Assessment:     36 year old male with flexor tenosynovitis s/p I&D 05/01/22    Plan:   No further operative plans  Start BID soaks followed by dry dressing   Elevate LUE as much as possible   Remainder  of care per primary    Thank you very much for asking me to consult in the care of your patient Alexander [name].    Signed by: Regino Schultze, MD    Electronically signed by Lynnell Chad at 05/06/2022 10:49 AM EDT

## 2022-05-06 NOTE — Assessment & Plan Note (Signed)
Formatting of this note is different from the original.  Discussed in Interdisciplinary Rounds       :    Patient admitted with   1. Flexor tenosynovitis of finger      Patient independent with mobility. Per previous CM, patient is homeless and living in a tent. This CM provided information concerning food and housing insecurities and bus passes. Patient has insurance. CM to follow up.    ANTICIPATED DISCHARGE PLAN  ? Discharge Plan:  (Pending recs)  ? COC loaded: N/A  ? Home Care/DME/LTAC (Company, Name of person you spoke with, phone, fax): N/A  ? Home Care Options Discussed with Patient/Family: No  ? Need for toileting asked to patient: Yes  ? Barriers to Discharge: blood cultures, Zofran IV, Morphine IV, Maxipeme IV, Vancomycin IV, banana bag, clinical improvment, Substance use consult, Hand surgery consult  ? Anticipated Discharge Date: 2 days?  ? Anticipated Discharge Time: w/ order  ? Anticipated Discharge Transportation: Bus  ? Pt/Family aware anticipated discharge time will be: w/ order  ? Prior auth needed for (list medication and dose.  Include name/phone/fax/ref #): N/a  ? Copay Check: N/A   Decision Making  Is patient able to return home with no needs?: No (Not sure at this time)  Does the patient have the ability to return home with support?: Yes  Would patient benefit from palliative care or hospice?: No  Could patient benefit from personal emergency response monitor?: No  Can patient go home with telemonitoring?: No  Does patient meet the definition of homebound?: No  Does patient need skilled nursing?: No  Home Care Options Discussed with Patient/Family: No     Home Medical Care - Admitted Since 05/06/2022    No services have been selected for the patient.        ? IML if applicable:    ? MOON letter if applicable:    ? Patient agrees to plan: Yes  ? CM/SW following today (name and phone#): Tiara Spencer/RN [id number]    Electronically signed by Caryn Section at 05/06/2022  3:34 PM EDT

## 2022-05-07 NOTE — Progress Notes (Signed)
Formatting of this note is different from the original.  Images from the original note were not included.    Infectious diseases progress note  [facility]     Patient: Alexander [name] MRN: [id number]  Room/Bed:  9147W/29   Date of Birth: 1986/07/22  Age: 36 year old  Gender: male      Date of Service:  05/07/2022  Admitting Physician: Fonda Kinder     Attending Physician: Clayborn Bigness., DO  Primary Care Physician:  Marisa Sprinkles, MD    Date of Admission:  05/06/2022  Hospital length of stay:  1    Reason for Consult:   Alexander [name] is being seen at the request of Alexander [name], Alexander [name] for Septic flexor tenosynovitis of left index finger    Chief Complaint: Septic flexor tenosynovitis of left index finger    History of Present Illness: Alexander [name] is a 36 year old male with a PMH of HIV noncompliant with ART, HCV, polysubstance abuse, IVDA, and homelessness currently [place] who was recently admitted to the hospital for septic left flexor tenosynovitis of index finger for which he underwent I&D with surgery on 05/01/2022 but patient left AMA on 05/02/2022 and was prescribed linezolid empirically which he never started as there was no prescription at his pharmacy.  Patient returned to the ER on 05/06/2022 as he wants to resume care for his left index infection due to worsening left index pain.   Patient denies fever.  He has been taking his Triumeq for the last month.  He follows with Dr. Manson Passey at [facility] but he has not been there for over a year now.  He continues to struggle with IVDA.  He uses his upper extremities for that (not fingers).  Interim History:  He is insisting on leaving AMA today. Having withdrawal sx. Finger is stable with no drainage. Has tolerated the IV abx.   Past Medical History:   Diagnosis Date   ? Hepatitis C    ? HIV (human immunodeficiency virus infection) (HCC)    ? Hypertension      Past Surgical History:   Procedure Laterality Date   ?  MANDIBLE SURGERY Left 2015     Social History     Tobacco Use   ? Smoking status: Every Day     Packs/day: 1.00     Years: 20.00     Total pack years: 20.00     Types: Cigarettes   ? Smokeless tobacco: Never   Substance Use Topics   ? Alcohol use: Yes     Alcohol/week: 24.0 standard drinks of alcohol     Types: 24 Cans of beer per week     Comment: DAILY       Family History   Problem Relation Age of Onset   ? Hypertension Mother    ? Seizures Father      No Known Allergies    Medications:    Scheduled Medications:   ? chlordiazePOXIDE  25 mg Oral Q6H   ? (*) MED SURGE Electrolyte Replacement Orders   Other MAR PLACE HOLDER   ? ferrous sulfate  325 mg Oral Daily with dinner   ? folic acid  1 mg Oral Daily   ? metoprolol tartrate  25 mg Oral BID   ? multivitamin w/ minerals  1 tablet Oral Daily   ? pantoprazole DR  40 mg Oral BID AC   ? abacavir  600  mg Oral Daily   ? dolutegravir  50 mg Oral Daily   ? lamiVUDine  300 mg Oral Daily   ? DAPTOmycin (CUBICIN) 400 mg in sodium chloride 0.9 % 50 mL IVPB  6 mg/kg (Adjusted) Intravenous Q24H     PRN Medications:  cloNIDine, oxyCODONE, morphine, sodium chloride, acetaminophen **OR** acetaminophen, sennosides-docusate, polyethylene glycol, melatonin, benzonatate, sodium chloride 0.65%, nystatin, ondansetron, hydrOXYzine Pamoate, LORazepam **OR** LORazepam **OR** LORazepam, ketorolac    Continuous Infusions:      Review of Systems   Constitutional: Negative for fatigue and fever.   Respiratory: Negative for cough.    Gastrointestinal: Negative for abdominal pain.   Musculoskeletal: Negative for back pain.   Neurological: Negative for headaches.     Pain Management: Pain Management per Attending Provider.      PHYSICAL EXAM     Vitals:   Temp:  [97.3 F (36.3 C)-98.4 F (36.9 C)] 97.8 F (36.6 C)  Pulse:  [60-87] 63  Resp:  [16-20] 19  BP: (119-167)/(80-113) 140/88  SpO2:  [96 %-100 %] 98 %  O2 Device: None (Room air)  O2 Flow Rate (L/min): --  O2 (%): --     Physical Exam    Constitutional: He is oriented to person, place, and time. He appears well-developed and well-nourished.   HENT:   Head: Normocephalic and atraumatic.   Eyes: No scleral icterus.   Pulmonary/Chest: Effort normal. No respiratory distress.   Abdominal: He exhibits no distension.   Musculoskeletal:      Comments: Left index finger wounds healing, no active purulence.  Track marks noted in  forearms.   Neurological: He is alert and oriented to person, place, and time. No cranial nerve deficit.   Skin: No rash noted. He is not diaphoretic.     Patient Lines/Drains/Airways Status     Active LINES/DRAINS/AIRWAYS     Name Placement date Placement time Site Days    Wound/Skin Exceptions 05/05/22 Finger (Comment which one) Anterior;Left 05/05/22  0508  Finger (Comment which one)  2           Labs/Cultures:  Recent Labs     05/06/22  0208 05/07/22  0555   HCT 29.1* 31.2*   HGB 9.1* 9.8*   MCV 66.2* 66.7*   PLT 129* 126*   WBC 6.8 4.7     Recent Labs     05/06/22  0208 05/06/22  0600 05/07/22  0555   AGAP 8  --  5   ALB  --   --  3.0*   ALT  --   --  84*   BUN 7*  --  9   CA 8.5  --  8.6   CL 99  --  101   CO2 27  --  28   CRET 0.52* 0.47* 0.47*   GLU 127*  --  97   K 3.8  --  4.0   MG  --  1.5*  --    NA 134*  --  134*   TP  --   --  7.0     No results found for: VANCT  All lab results for the last 24 hours reviewed.  Lab Results   Component Value Date    CULT  05/06/2022     NO GROWTH (NEGATIVE) TO DATE.                     If results change, notification will follow.  Culture is held for 5  days.    CULT  05/06/2022     Tested at [facility] [address] [facility] [phone]    CULT  05/06/2022     NO GROWTH (NEGATIVE) TO DATE.                     If results change, notification will follow.  Culture is held for 5 days.    CULT  05/06/2022     Tested at [facility] [address] [phone]    CULT No anaerobic growth at 5 days. 05/01/2022    CULT  05/01/2022     Tested at: [facility] [address]  [employer] [address]    CULT Positive Growth 05/01/2022    CULT METHICILLIN RESISTANT STAPHYLOCOCCUS AUREUS 05/01/2022    CULT  05/01/2022     (NOTE) Abundant growth of Methicillin Resistant Staphylococcus aureus PBP2a: a rapid assay that aids in identifying MRSA by detecting the protein that confers resistance to methicillin. A negative result does not exclude methicillin resistance due to   other mechanisms. METHICILLIN-RESISTANT STAPHYLOCOCCUS AUREUS PBP2A Positive Tested at: [facility] [address] [address]     CULT  29/56/2130     Tested at [facility] [address] [facility] [phone]     IMAGING & OTHER STUDIES     Imaging:  XR CHEST AP PORTABLE    Result Date: 05/06/2022  No significant abnormality     Principal Problem:    Flexor tenosynovitis of finger  Active Problems:    Iron deficiency anemia    Hypertension    Hepatitis C    Alcohol abuse    Opiate abuse, continuous (HCC)    HIV (human immunodeficiency virus infection) (HCC)    Cocaine abuse (HCC)    Chronic gastric ulcer without hemorrhage and without perforation    Assessment   Alexander [name] is a 36 year old male admitted on 05/06/2022 with:    #Left index finger tenosynovitis:  S/p spider bite.  -s/p I/D of flexor sheath left index finger with surgery on 5/29.    Culture: MRSA; susceptibilities reviewed.  -Left AMA on 05/02/2022 (he could not fill linezolid as outpatient).  -Return to the hospital 6/2.  Evaluated by plastic surgery with no I&D planned.  -signing out [facility] 6/3    #Active polysubstance abuse/IVDA.  #Alcohol use disorder.    #Chronic Hep C:  -HCV NAAT 949K.    #HIV:  -04/24/2022: HIV VL undetected.  CD4 389/35%.  -Has been taking his Triumeq for the last month.  -CD4: 389/35%    #Therapeutic drug monitoring:  Estimated Creatinine Clearance: 118 mL/min (A) (by C-G formula based on SCr of 0.47 mg/dL (L)).    PLAN   -Will rx with po Bactrim for 10 more days  -rx sent to his Pharmacy, he says that he will get the  rx  -No I&D planned per surgery.  -Continue Triumeq.  -Patient was advised to follow-up with ID at [facility] after discharge (to discuss HIV and Hep C treatment).    Alvester Morin, MD  [facility]    Electronically signed by: Alvester Morin, MD, 05/07/2022 9:53 AM      Electronically signed by Alvester Morin., MD at 05/07/2022  1:02 PM EDT

## 2022-05-07 NOTE — Discharge Summary (Signed)
Formatting of this note is different from the original.  Images from the original note were not included.      [facility] discharge summary  [facility]    Patient: Alexander [name] MRN: [id number]  Room/Bed:  9147W/29   Date of Birth: May 03, 1986  Age: 36 year old  Gender: male      Admit Date:  05/06/2022  Discharge Date:  05/07/2022    Code Status:  Full Code  Primary Care Physician  Pcp, None, MD  Attending Physician  Clayborn Bigness., DO    Discharge Diagnoses: Principal Problem:    Flexor tenosynovitis of finger (POA: Yes)  Active Problems:    Hypertension (POA: Yes)    Hepatitis C (POA: Yes)    Alcohol abuse (POA: Yes)    Opiate abuse, continuous (HCC) (POA: Yes)    HIV (human immunodeficiency virus infection) (HCC) (POA: Yes)    Cocaine abuse (HCC) (POA: Yes)    Iron deficiency anemia (POA: Yes)    Chronic gastric ulcer without hemorrhage and without perforation (POA: Yes)  Resolved Problems:    * No resolved hospital problems. [facility] Course:  The patient is a(n) 36 year old male who was admitted for left finger cellulitis/tenosynovitis.  Hand surgery was consulted and patient did not require any acute intervention.  ID was consulted and patient was started on daptomycin.  Patient received multimodal pain control.    On 6/3, patient stated that he wanted to leave AGAINST MEDICAL ADVICE.  Patient will be sent home with p.o. Bactrim through 6/15.  Patient will follow-up with infectious disease as an outpatient.        Disposition:  Home      AGAINST MEDICAL ADVICE             Discharge Medications:       Medication List     CONTINUE taking these medications    ferrous sulfate 325 (65 FE) MG Tabs  Take 1 tablet by mouth daily with dinner.    folic acid 1 MG Tabs  Commonly known as: FOLVITE  Take 1 tablet by mouth daily.    hydrOXYzine Pamoate 25 mg capsule  Commonly known as: VISTARIL  Take 1-2 capsules by mouth every 8 (eight) hours as needed.    metoprolol tartrate 25 MG Tabs  Commonly  known as: LOPRESSOR    multivitamin w/ minerals Tabs  Take 1 tablet by mouth daily.    ondansetron 4 MG Tbdp  Commonly known as: ZOFRAN-ODT  Take 1 tablet by mouth every 8 (eight) hours as needed.    pantoprazole DR 40 MG Tbec  Commonly known as: Protonix  Take 1 tablet by mouth every morning before breakfast. Indications: Stomach Ulcer    Triumeq 600-50-300 mg Tabs  Generic drug: abacavir-dolutegravir-lamiVUDine      STOP taking these medications    linezolid 600 MG Tabs  Commonly known as: ZYVOX      ASK your doctor about these medications    * sulfamethoxazole-trimethoprim 800-160 mg per tablet  Commonly known as: Bactrim DS  Take 1 tablet by mouth every 12 (twelve) hours for 12 days.  Ask about: Which instructions should I use?    * sulfamethoxazole-trimethoprim 800-160 mg per tablet  Commonly known as: Bactrim DS  Take 1 tablet by mouth every 12 (twelve) hours.  Ask about: Which instructions should I use?       * This list has 2 medication(s) that are the same as  other medications prescribed for you. Read the directions carefully, and ask your doctor or other care provider to review them with you.           Where to Get Your Medications     These medications were sent to [facility] - [place], [place] - [address]  [address], [place] [address]    Hours: 9am-5:30pm Mon-Fri, 9am-12:30pm Sat Phone: [phone]    sulfamethoxazole-trimethoprim 800-160 mg per tablet    These medications were sent to [facility] [id number] [place], [facility] - [address] AT [facility] [address]  [address] [place] [address]    Phone: [phone]    sulfamethoxazole-trimethoprim 800-160 mg per tablet      Medication issues:     Significant Labs:      Latest Reference Range & Units 05/06/22 02:08 05/06/22 06:00 05/07/22 05:55   SODIUM 135 - 145 mEq/L 134 (L)  134 (L)   POTASSIUM 3.6 - 5.1 mEq/L 3.8  4.0   CHLORIDE 98 - 111 mEq/L 99  101   CO2 21 - 31 mmol/L 27  28   BLD UREA NITROGEN 8 -  26 mg/dL 7 (L)  9   CREATININE 3.87 - 1.30 mg/dL 5.64 (L) 3.32 (L) 9.51 (L)   ESTIMATED GFR >59 mL/min/1.73 m2 136 141 141   ANION GAP 4 - 16 mmol/L 8  5   GLUCOSE, RANDOM 70 - 99 mg/dL 884 (H)  97   CALCIUM 8.5 - 10.4 mg/dL 8.5  8.6   MAGNESIUM 1.7 - 2.4 mg/dL  1.5 (L)    LACTIC ACID 0.5 - 2.0 mmol/L 1.3     CPK 30 - 233 IU/L   25 (L)   TOTAL PROTEIN 6.0 - 8.0 g/dL   7.0   ALBUMIN 3.5 - 5.7 g/dL   3.0 (L)   TOTAL BILIRUBIN 0.0 - 1.2 mg/dL   0.6   BILIRUBIN, DIRECT 0.0 - 0.2 mg/dL   0.3 (H)   ALK PHOSPHATASE 35 - 135 IU/L   111   ALT 10 - 60 IU/L   84 (H)   AST 10 - 40 IU/L   156 (H)   (L): Data is abnormally low  (H): Data is abnormally high     Latest Reference Range & Units 05/06/22 02:08 05/06/22 12:34 05/07/22 05:55   WBC 3.6 - 10.5 THOU/mcL 6.8  4.7   RBC 4.40 - 5.80 MIL/mcL 4.39 (L)  4.68   HEMOGLOBIN 13.5 - 16.5 g/dL 9.1 (L)  9.8 (L)   HEMATOCRIT 40 - 50 % 29.1 (L)  31.2 (L)   MCV 82 - 97 fL 66.2 (L)  66.7 (L)   MCH 27 - 33 pg 20.7 (L)  20.9 (L)   MCHC 32 - 36 g/dL 16.6 (L)  06.3 (L)   RDW 12.3 - 17.0 % 19.4 (H)  19.6 (H)   PLATELET 140 - 375 THOU/mcL 129 (L)  126 (L)   MPV 7.4 - 11.5 fL 9.4  8.4   SEGS % 74  61   LYMPHOCYTES % 13  21   MONOCYTES % 11  14   EOSINOPHIL % 1  3   BASOPHILS % 1  1   ABS. NEUTROPHIL 1.80 - 7.70 THOU/mcL 5.10  2.90   ABS LYMPHS 1.00 - 4.00 THOU/mcL 0.90 (L)  1.00   ABS MONOS 0.20 - 0.90 THOU/mcL 0.80  0.70   ABS EOS 0.03 - 0.45 THOU/mcL 0.00 (L)  0.10  ABS BASOS 0.00 - 0.20 THOU/mcL 0.00  0.00   RBC MORPHOLOGY  Unremarkable   Tested at [facility] [address] [address]     ESR 0 - 15 mm/hr   73 (H)   SPECIMEN DESCRIPTION  BLOOD (SITE 1 PERIPHERAL)  BLOOD (SITE 2 PERIPHERAL)     SPECIMEN TYPE  Blood  Blood     AMPHETAMINES NEGATIVE   NEGATIVE    BARBITURATES NEGATIVE   NEGATIVE    BENZODIAZEPINES NEGATIVE   POSITIVE !    COCAINE NEGATIVE   POSITIVE !    METHADONE NEGATIVE   NEGATIVE    OPIATES NEGATIVE   POSITIVE !    PCP NEGATIVE   NEGATIVE    TETRAHYDROCANNIBINOL  NEGATIVE   NEGATIVE    OXYCODONE NEGATIVE   NEGATIVE    URINE CREATININE mg/dL  16.1    ETHANOL <09.6 mg/dL 96 (H)     (L): Data is abnormally low  (H): Data is abnormally high  !: Data is abnormal     Latest Reference Range & Units 05/06/22 02:08 05/07/22 05:55   C-REACTIVE PROTEIN <5.0 mg/L 13.00 (H) 11.00 (H)   (H): Data is abnormally high    Discharge Pain Management Plan: Patient?s discharge pain status is pain is adequately controlled. Discharge plan for pain control discussed with  patient.   Discharge pain management includes non-pharmacological pain reduction interventions, medications to be taken as instructed, and to follow up with PCP for any changes.     Pending Tests/Issues:    Pending Results (From admission, onward)     Start        05/06/22 0450  URINE DRUG SCREEN 9  Upon admission                Patient has been informed that there are results pending as above.  They are aware that they will be informed of any critical value, but should follow up with their PCP to review results.    Discharge Follow up/ Next Steps:   Pcp, None, MD  [facility]  [phone]    Schedule an appointment as soon as possible for a visit in 2 week(s)    Consults:    PHARMACY TO DOSE MEDICATION  PHARMACY TO DOSE VANCOMYCIN  IP CONSULT TO CHEMICAL DEPENDENCY  IP CONSULT TO HAND SURGERY  IP CONSULT TO INFECTIOUS DISEASES    Procedures:  XR CHEST AP PORTABLE    Result Date: 05/06/2022  No significant abnormality     XR HAND LEFT PA LATERAL AND OBLIQUE    Result Date: 05/01/2022   Marked soft tissue swelling of the index finger with no underlying evidence of osteomyelitis or radiopaque foreign body.      Physical Exam:    BP 140/88 (Patient Position: Lying)   Pulse 63   Temp 97.8 F (36.6 C) (Oral)   Resp 19   SpO2 98%    The patient was seen and examined on the day of discharge.  Exam was notable for:    Constitutional: Caucasian male, chronic appearing, no acute distress, non-toxic appearance    Cardiovascular:   Normal heart rate, normal rhythm, no murmurs, no rubs, no gallops  Respiratory:  Normal breath sounds, no respiratory distress, no wheezing  GI:  Bowel sounds normal, soft, no tenderness, no masses   Extremities: Left hand wrapped, intact distal pulses, no edema, no tenderness  Neurologic:  Normal motor function, normal sensory function, no focal deficits noted  Skin:  Warm and dry  Psychiatric: Benign affect, normal mood.    Condition at Discharge: Stable.    Total time on discharge and discharge planning was 35 minutes.    Electronically signed by: Clayborn Bigness, DO, 05/07/2022 2:58 PM    Please note that some or all of this record was generated using voice recognition software. If there are any questions about the content of this document, please contact the author as some errors of transcription may have occurred.    Electronically signed by Clayborn Bigness., DO at 05/07/2022  2:58 PM EDT

## 2022-05-07 NOTE — Progress Notes (Signed)
Formatting of this note is different from the original.  Hand Surgery Progress Note    Patient: Alexander [name] MRN: [id number]     Date of Birth: [birth year]  Age: 36 year old  Sex: male    Unit: [id number] Room/Bed: 0781C/01 Location: [facility]     Admitting Physician:     Subjective   Endorses pain to finger.    Physical Exam  Vital Signs:  BP 143/88 (Patient Position: Semi-Fowlers)   Pulse 60   Temp 97.8 F (36.6 C) (Oral)   Resp 16   SpO2 100%   Constitutional:  NAD  HENT:  Normocephalic, Atraumatic, MMM  Cardiovascular:  Normal heart rate  Respiratory:  Normal breath sounds, No respiratory distress, No wheezing   Extremities:  Incisions c/d/i, no purulent drainage, appropriate swelling, minimal erythema    Intake & Output  Current Shift:   No intake/output data recorded.    Labs  Recent Results (from the past 24 hour(s))   URINE DRUG SCREEN 9    Collection Time: 05/06/22 12:34 PM   Result Value Ref Range    PCP NEGATIVE NEGATIVE    BENZODIAZEPINES POSITIVE (A) NEGATIVE    COCAINE POSITIVE (A) NEGATIVE    AMPHETAMINES NEGATIVE NEGATIVE    TETRAHYDROCANNIBINOL NEGATIVE NEGATIVE    OPIATES POSITIVE (A) NEGATIVE    BARBITURATES NEGATIVE NEGATIVE    METHADONE NEGATIVE NEGATIVE    OXYCODONE NEGATIVE NEGATIVE    CREATININE URINE 29.9 mg/dL   BASIC METABOLIC PNL    Collection Time: 05/07/22  5:55 AM   Result Value Ref Range    BLD UREA NITROGEN 9 8 - 26 mg/dL    SODIUM 914 (L) 782 - 145 mEq/L    POTASSIUM 4.0 3.6 - 5.1 mEq/L    CHLORIDE 101 98 - 111 mEq/L    CO2 28 21 - 31 mmol/L    GLUCOSE, RANDOM 97 70 - 99 mg/dL    CREATININE 9.56 (L) 0.70 - 1.30 mg/dL    ANION GAP 5 4 - 16 mmol/L    CALCIUM 8.6 8.5 - 10.4 mg/dL    ESTIMATED GFR 213 >08 mL/min/1.73 m2   HEPATIC FUNC PANEL    Collection Time: 05/07/22  5:55 AM   Result Value Ref Range    ALK PHOSPHATASE 111 35 - 135 IU/L    TOTAL PROTEIN 7.0 6.0 - 8.0 g/dL    ALBUMIN 3.0 (L) 3.5 - 5.7 g/dL    AST 657 (H) 10 - 40 IU/L    ALT 84 (H) 10 - 60  IU/L    TOTAL BILIRUBIN 0.6 0.0 - 1.2 mg/dL    DIRECT BILIRUBIN 0.3 (H) 0.0 - 0.2 mg/dL   CBC W/ DIFF    Collection Time: 05/07/22  5:55 AM   Result Value Ref Range    WBC 4.7 3.6 - 10.5 THOU/mcL    RBC 4.68 4.40 - 5.80 MIL/mcL    HEMOGLOBIN 9.8 (L) 13.5 - 16.5 g/dL    HEMATOCRIT 84.6 (L) 40 - 50 %    MCV 66.7 (L) 82 - 97 fL    MCH 20.9 (L) 27 - 33 pg    MCHC 31.4 (L) 32 - 36 g/dL    RDW 96.2 (H) 95.2 - 17.0 %    PLATELET 126 (L) 140 - 375 THOU/mcL    MPV 8.4 7.4 - 11.5 fL    ABS. NEUTROPHIL 2.90 1.80 - 7.70 THOU/mcL    ABS LYMPHS 1.00 1.00 - 4.00 THOU/mcL  ABS MONOS 0.70 0.20 - 0.90 THOU/mcL    ABS EOS 0.10 0.03 - 0.45 THOU/mcL    ABS BASOS 0.00 0.00 - 0.20 THOU/mcL    SEGS 61 %    LYMPHOCYTES 21 %    MONOCYTES 14 %    EOSINOPHIL 3 %    BASOPHILS 1 %   C-Reactive Protein    Collection Time: 05/07/22  5:55 AM   Result Value Ref Range    C-REACTIVE PROTEIN 11.00 (H) <5.0 mg/L   Sed Rate    Collection Time: 05/07/22  5:55 AM   Result Value Ref Range    ESR 73 (H) 0 - 15 mm/hr   CK (Use Troponin to evaluate chest pain)    Collection Time: 05/07/22  5:55 AM   Result Value Ref Range    CPK 25 (L) 30 - 233 IU/L     Accuchecks:   Lab Results   Component Value Date/Time    GLU 97 05/07/2022 05:55 AM     Assessment  36 year old male with flexor tenosynovitis s/p I&D 05/01/22    Plan  Continue BID soaks followed by dry dressing   Elevate/ice/anaglesia   Abx per ID      Electronically signed by:  Casilda Carls, MD  Hand Surgery Fellow  05/07/2022 9:57 AM      Electronically signed by Loraine Grip at 05/07/2022 10:03 AM EDT

## 2022-05-07 NOTE — Progress Notes (Signed)
Formatting of this note might be different from the original.  For your records, the bill status(es) for Alexander [name] during their hospital stay is as follows:  05/06/2022 0131  Admission Emergency  05/06/2022 0351  Patient Update Inpatient  05/07/2022 1307  Discharge Inpatient     :    Any questions regarding PATIENT CLASS/STATUS should be directed to the Care Management Departments:  [facility] [phone] or [facility] [phone] or [facility] [phone].  Electronically signed by Ebbie Latus, MD at 05/08/2022  8:57 AM EDT

## 2022-05-07 NOTE — Progress Notes (Signed)
Formatting of this note might be different from the original.  Pt just left AMA. Pt reports pain, pt has been given oxycodone and morphine this morning. Pt states these did not help his pain. Attempted to talk to pt about IV drug use and getting clean, pt reports I know I need to. Pt had tears in eyes. Pt educated on his infection and that with no antibiotics this infection could cause him to lose his finger, hand, or kill him. Pt states Alexander [name], I know that's why I came here.  Pt encouraged to stay and talk to Dr. Finis Bud as he is coming to see him, he was notified immediately after pt mentioned AMA. Pt declined to wait. Pt declined another set of vitals. Pt notified if his withdraws felt bad, and like our medications were not working we could speak to Dr. Finis Bud about stronger medications possibly to help, unsure if this was a possibility for him. Pt refused to stay and speak with MD. Pt encouraged to get help if his infection worsens. Pt reports pain all over and stated he felt as if it was the infection. Pt educated this is why is on IV antibiotics, to fight his infection. Pt refused to stay. Pt had already called a friend to pick him up. Pt ambulated out through ER to await friend.   Electronically signed by Carolanne Grumbling, Registered Nurse at 05/07/2022  1:27 PM EDT

## 2022-05-07 NOTE — Progress Notes (Signed)
Formatting of this note is different from the original.  Images from the original note were not included.      TRI[facility]ALTH PROGRESS NOTE  [facility]    Patient: Alexander [name] MRN: [id number]  Room/Bed:  4259D/63   Date of Birth: [birth year]  Age: 36 year old  Gender: male      Date of Service   05/07/2022       SUBJECTIVE     Alexander [name] was seen and examined.  The patient is a 36 year old male who was admitted on 05/06/2022 with L index finger/hand cellulitis.    Vitals and labs reviewed  Patient states that he is having left hand pain, fever/chills.  Has concerns of withdrawal.    ROS: + Left hand pain, fever/chills  Respiratory:  no shortness of breath  Cardiovascular:  no chest pain or palpitations  Gastrointestinal:  no nausea, vomiting    Pain:  Patient?s current pain level and Pain Care Plan reviewed.   Patient?s current pain status is: pain is adequately controlled, continue with current plan    OBJECTIVE     PHYSICAL EXAM  Temp:  [97.3 F (36.3 C)-98.4 F (36.9 C)] 97.3 F (36.3 C)  Pulse:  [66-87] 73  Resp:  [16-20] 18  BP: (119-167)/(80-113) 119/80  SpO2:  [96 %-100 %] 100 %  O2 Device: None (Room air)  O2 Flow Rate (L/min): --  O2 (%): --  Estimated body mass index is 20.73 kg/m as calculated from the following:    Height as of 04/28/20: 69 (175.3 cm).    Weight as of 05/01/22: 140 lb 6.4 oz (63.7 kg).    Constitutional: Caucasian male, chronic appearing, no acute distress, non-toxic appearance   Cardiovascular:   Normal heart rate, normal rhythm, no murmurs, no rubs, no gallops  Respiratory:  Normal breath sounds, no respiratory distress, no wheezing  GI:  Bowel sounds normal, soft, no tenderness, no masses   Extremities: Left hand wrapped, intact distal pulses, no edema, no tenderness  Neurologic:  Normal motor function, normal sensory function, no focal deficits noted  Skin:  Warm and dry  Psychiatric: Benign affect, normal mood.    Intake and Output:    Intake/Output  Summary (Last 24 hours) at 05/07/2022 0742  Last data filed at 05/06/2022 1452  Gross per 24 hour   Intake 240 ml   Output 1000 ml   Net -760 ml       LDAs:     Patient Lines/Drains/Airways Status     Active LINES/DRAINS/AIRWAYS     Name Placement date Placement time Site Days    Wound/Skin Exceptions 05/05/22 Finger (Comment which one) Anterior;Left 05/05/22  0508  Finger (Comment which one)  2           Labs/Cultures:   Recent Labs     05/06/22  0208 05/07/22  0555   HCT 29.1* 31.2*   HGB 9.1* 9.8*   PLT 129* 126*   WBC 6.8 4.7       Recent Labs     05/06/22  0208 05/06/22  0600 05/07/22  0555   BUN 7*  --  9   CA 8.5  --  8.6   CL 99  --  101   CO2 27  --  28   CRET 0.52* 0.47* 0.47*   GLU 127*  --  97   K 3.8  --  4.0   MG  --  1.5*  --  NA 134*  --  134*     Recent Labs     05/07/22  0555   ALB 3.0*   ALKP 111   ALT 84*   AST 156*     No results for input(s): INR in the last 72 hours.    Blood sugars:  No results found for: NPGLU    Imaging & OTHER STUDIES     No results found.     I have reviewed the results     Assessment     Principal Problem:    Flexor tenosynovitis of finger  Active Problems:    Hypertension    Hepatitis C    Alcohol abuse    Opiate abuse, continuous (HCC)    HIV (human immunodeficiency virus infection) (HCC)    Cocaine abuse (HCC)    Iron deficiency anemia    Chronic gastric ulcer without hemorrhage and without perforation                PLAN     1.  Left index finger cellulitis/flexor tendinitis  -Surgery consulted, appreciate recs, no acute intervention  -Infectious disease consulted, appreciate recs. Discussed with Dr Lloyd Huger, continue daptomycin (day 2 abx). Can eventually discharge on PO bactrim (end date 05/19/22)  -Multimodal pain control  2.  Polysubstance abuse  -Opioids, cocaine, alcohol  -Substance abuse team consulted  -CIWA protocol, as needed Ativan  3.  HIV-antiretroviral therapy  4.  Hepatitis C-not a candidate for treatment due to ongoing IV drug use  5.   Hypertension-continue metoprolol  6.  Anemia of chronic disease  -Secondary to HIV, cellulitis  -Continue iron supplementation    DVT prophylaxis: Low risk, Ambulate  Anticipated date of discharge: ELOS: DC 1-2 days  Anticipated venue at discharge: Home  Barriers to discharge: IV abx, clinical improvement    Electronically signed by: Clayborn Bigness, DO, 05/07/2022 7:42 AM    Please note that some or all of this record was generated using voice recognition software. If there are any questions about the content of this document, please contact the author as some errors of transcription may have occurred.     DURING THE DAY: From 8:30 to 5pm, for patient needs, please contact the listed attending physician in Epic or [facility].   AFTER HOURS: From 5pm to 8:30am, for urgent needs, please contact the cross cover hospitalist for your hospital in [place].  Electronically signed by Clayborn Bigness., DO at 05/07/2022  9:43 AM EDT

## 2022-05-07 NOTE — Progress Notes (Signed)
Formatting of this note might be different from the original.  INPATIENT status was ordered by Admitting Physician.     :    Any questions regarding PATIENT CLASS/STATUS should be directed to the Care Management Departments:  [facility] [phone] or [facility] [phone] or [facility]  [phone].  Electronically signed by Ebbie Latus, MD at 05/07/2022  9:13 AM EDT

## 2022-05-07 NOTE — Telephone Encounter (Signed)
Formatting of this note might be different from the original.  rx sent to his pharmacy  Electronically signed by Alvester Morin., MD at 05/07/2022  1:05 PM EDT

## 2022-11-21 MED ORDER — TRIUMEQ 600-50-300 mg Tab
600-50-300 | ORAL_TABLET | ORAL | 1 refills | Status: AC
Start: 2022-11-21 — End: ?

## 2023-06-23 MED ORDER — TRIUMEQ 600-50-300 mg Tab
600-50-300 | ORAL_TABLET | Freq: Every day | ORAL | 1 refills | Status: AC
Start: 2023-06-23 — End: 2023-09-14

## 2023-06-23 NOTE — Telephone Encounter (Addendum)
Unable to speak with pt or to send a letter.  Phone number does not exist.  This nurse attempted to send a letter, however there is no address in EPIC either, pt is homeless.

## 2023-06-23 NOTE — Telephone Encounter (Signed)
Can Alexander [name] be scheduled for follow-up with me within the next month? He needs to come in for a visit and to have labs done in order for me to continue to safely refill his Triumeq.

## 2023-09-13 ENCOUNTER — Emergency Department: Admit: 2023-09-13 | Payer: PRIVATE HEALTH INSURANCE

## 2023-09-13 DIAGNOSIS — L03116 Cellulitis of left lower limb: Secondary | ICD-10-CM

## 2023-09-13 LAB — PROTIME-INR
INR: 1.2 — ABNORMAL HIGH (ref 0.9–1.1)
Protime: 16.2 s — ABNORMAL HIGH (ref 12.1–15.1)

## 2023-09-13 LAB — B NATRIURETIC PEPTIDE: BNP: 121 pg/mL — ABNORMAL HIGH (ref 0–100)

## 2023-09-13 LAB — BASIC METABOLIC PANEL
Anion Gap: 7 mmol/L (ref 3–16)
BUN: 5 mg/dL — ABNORMAL LOW (ref 7–25)
CO2: 27 mmol/L (ref 21–33)
Calcium: 8.2 mg/dL — ABNORMAL LOW (ref 8.6–10.3)
Chloride: 96 mmol/L — ABNORMAL LOW (ref 98–110)
Creatinine: 0.37 mg/dL — ABNORMAL LOW (ref 0.60–1.30)
EGFR: >90
Glucose: 88 mg/dL (ref 70–100)
Osmolality, Calculated: 267 mosm/kg — ABNORMAL LOW (ref 278–305)
Potassium: 4 mmol/L (ref 3.5–5.3)
Sodium: 130 mmol/L — ABNORMAL LOW (ref 133–146)

## 2023-09-13 LAB — CBC
Hematocrit: 36.5 % — ABNORMAL LOW (ref 38.5–50.0)
Hemoglobin: 12.2 g/dL — ABNORMAL LOW (ref 13.2–17.1)
MCH: 27.4 pg (ref 27.0–33.0)
MCHC: 33.4 g/dL (ref 32.0–36.0)
MCV: 82 fL (ref 80.0–100.0)
MPV: 9.7 fL (ref 7.5–11.5)
Platelets: 74 10*3/uL — ABNORMAL LOW (ref 140–400)
RBC: 4.45 10*6/uL (ref 4.20–5.80)
RDW: 15.7 % — ABNORMAL HIGH (ref 11.0–15.0)
WBC: 9.4 10*3/uL (ref 3.8–10.8)

## 2023-09-13 LAB — DIFFERENTIAL
Basophils Absolute: 56 /uL (ref 0–200)
Basophils Relative: 0.6 % (ref 0.0–1.0)
Eosinophils Absolute: 28 /uL (ref 15–500)
Eosinophils Relative: 0.3 % (ref 0.0–8.0)
Lymphocytes Absolute: 921 /uL (ref 850–3900)
Lymphocytes Relative: 9.8 % — ABNORMAL LOW (ref 15.0–45.0)
Monocytes Absolute: 912 /uL (ref 200–950)
Monocytes Relative: 9.7 % (ref 0.0–12.0)
Neutrophils Absolute: 7482 /uL (ref 1500–7800)
Neutrophils Relative: 79.6 % (ref 40.0–80.0)
nRBC: 0 /100{WBCs} (ref 0–0)

## 2023-09-13 LAB — HEPATIC FUNCTION PANEL
ALT: 46 U/L (ref 7–52)
AST: 115 U/L — ABNORMAL HIGH (ref 13–39)
Albumin: 3.2 g/dL — ABNORMAL LOW (ref 3.5–5.7)
Alkaline Phosphatase: 146 U/L — ABNORMAL HIGH (ref 36–125)
Bilirubin, Direct: 0.8 mg/dL — ABNORMAL HIGH (ref 0.0–0.4)
Bilirubin, Indirect: 0.6 mg/dL (ref 0.0–1.1)
Total Bilirubin: 1.4 mg/dL (ref 0.0–1.5)
Total Protein: 8.4 g/dL (ref 6.4–8.9)

## 2023-09-13 LAB — APTT: aPTT: 50.3 s — ABNORMAL HIGH (ref 25.5–35.0)

## 2023-09-13 LAB — HIGH SENSITIVITY TROPONIN: High Sensitivity Troponin: 8 ng/L (ref 0–20)

## 2023-09-13 LAB — LACTIC ACID, VENOUS BLOOD GAS: Lactate, Ven: 1.2 mmol/L (ref 0.5–2.2)

## 2023-09-13 MED ORDER — ketorolac (TORADOL) injection 15 mg
15 | Freq: Once | INTRAMUSCULAR | Status: AC
Start: 2023-09-13 — End: 2023-09-13
  Administered 2023-09-13: 22:00:00 via INTRAVENOUS

## 2023-09-13 MED FILL — KETOROLAC 15 MG/ML INJECTION SOLUTION: 15 15 mg/mL | INTRAMUSCULAR | Qty: 1

## 2023-09-13 NOTE — ED Notes (Signed)
Bed: F77U  Expected date:   Expected time:   Means of arrival:   Comments:  TRIAGE

## 2023-09-13 NOTE — ED Provider Notes (Signed)
[facility] ED Note    Date of Service: 09/13/2023    Reason for Visit: Leg Swelling, Wound Infection, and Chest Pain    Nursing notes and triage notes were appropriately reviewed.    Patient History     HPI  Alexander [name] is a 36 y.o. male with history of HIV infection, liver disease, hepatitis C, SUD, bipolar, anxiety, who presents with a chief complaint of left leg cellulitis.  States his leg is becoming increasingly painful, swollen, red over the last 4 days.  He is still actively injecting fentanyl, he additionally drinks a case of beer a day with his most recent drink being around 130 right before he came into the emergency department.  He does have a history of complicated alcohol withdrawal with seizure.  Patient states he has not been taking antiretroviral therapy for his HIV for at least 1 year.  He also reports that he has lost at least 40 pounds in the last few months.  He is currently homeless.  He has not been taking any antibiotics and has not seen anybody for these problems recently.  Denies fever.  States that his mouth also is causing him a lot of pain and that he has had a mouth infection for many months now.    Past Medical History  Past Medical History:   Diagnosis Date    Anxiety     Bipolar disorder (CMS-HCC)     Bronchitis     Depression     Drug abuse and dependence (CMS-HCC)     H/O tooth extraction     Hepatitis C     HIV infection (CMS-HCC)     Liver disease Hep C    PTSD (post-traumatic stress disorder)     Substance induced mood disorder (CMS-HCC)     Thyroid disease        Past Surgical History  Past Surgical History:   Procedure Laterality Date    MANDIBLE SURGERY      OPEN REDUCTION INTERNAL FIXATION MANDIBLE Bilateral 01/27/2021    Procedure: OPEN REDUCTION INTERNAL FIXATION MANDIBLE, MAXILLOMANDIBULAR FIXATION;  Surgeon: Ellery Plunk, MD;  Location: [facility] OR;  Service: ENT;  Laterality: Bilateral;       Family History  Family History   Problem Relation Age of Onset    Drug abuse  Brother     Suicidality Brother     Hypertension Mother     Transient ischemic attack Mother     Seizures Father     Hypertension Father     Cancer Father     Breast Cancer Father     Suicidality Father     Drug abuse Brother     Suicidality Brother        Social History  BURNETT SPRAY  reports that he has been smoking cigarettes. He has a 11 pack-year smoking history. He has never used smokeless tobacco. He reports current alcohol use. He reports current drug use. Drugs: Heroin, Other-see comments, Marijuana, and Cocaine.    Home Medications  Previous Medications    NALOXONE (NARCAN) 4 MG/ACTUATION SPRY    Apply 1 spray in one nostril if needed. Call 911. May repeat dose in other nostril if no response in 3 minutes.    ONDANSETRON (ZOFRAN) 4 MG TABLET    Take 1 tablet (4 mg total) by mouth every 8 hours as needed for Nausea.    TRIUMEQ 600-50-300 MG TAB    take one tablet by mouth daily  Allergies:   Allergies as of 09/13/2023    (No Known Drug Allergies or Adverse Reactions)       Review of Systems   All relevant systems negative except for pertinent positives and negatives as noted in the HPI.    Physical Exam     Vitals:    09/13/23 2255 09/13/23 2358 09/14/23 0023 09/14/23 0135   BP: (!) 142/95 (!) 140/102 (!) 137/102    BP Location: Left upper arm Left upper arm Left upper arm    Patient Position: Sitting Lying Lying    BP Cuff Size: Small Regular Small    Pulse: 90 88 85    Resp: 16 15 12     Temp:  99.1 F (37.3 C)     TempSrc:  Oral     SpO2: 100% 100% 100%    Weight:    117 lb (53.1 kg)       Physical Exam  Constitutional:       Appearance: He is well-developed.   HENT:      Head: Normocephalic and atraumatic.   Cardiovascular:      Rate and Rhythm: Normal rate and regular rhythm.      Heart sounds: Normal heart sounds.   Pulmonary:      Effort: Pulmonary effort is normal. No tachypnea or respiratory distress.      Breath sounds: Normal breath sounds.   Chest:      Chest wall: No tenderness.    Abdominal:      Palpations: Abdomen is soft.      Comments: Right inguinal hernia that is reducible   Musculoskeletal:      Comments: See media tab for photo.  Left lower extremity circumferential erythema and swelling from his foot to below his knee.  No areas of distinct fluctuance or fluid collection.   Neurological:      Mental Status: He is alert.         Diagnostic Studies     Labs:  Please see the EMR for laboratory studies performed in the ED.    Radiology:  Please see the EMR for radiology studies performed in the ED.    EKG Interpretation  Interpreted by emergency department physician      Emergency Department Procedures   Procedures    ED Course and MDM     [name] is a 37 y.o. male with a history and presentation as described above in HPI.  The patient was evaluated by myself and the ED Attending Physician, Dr. Jenness Corner, and R4 Dr. Lynnae January. All management and disposition plans were discussed and agreed upon.    Patient presents emerged department with 4-day history of worsening lower extremity cellulitis and multiple month history of rapid weight loss and untreated HIV.  He was hemodynamically stable upon presentation.  Left lower extremity is consistent with a cellulitis however also considered DVT.  Patient did not undergo ultrasound evaluation at this time as cellulitis is thought to be more likely.  There is no discrete areas of fluctuance or fluid collection concerning for abscess.  He was started on vancomycin and ceftriaxone.  He was treated symptomatically with 2 doses of IV Toradol.  A sepsis workup was also completed with 2 blood cultures drawn.  Labs are consistent with a known stigmata of liver disease.  X-ray of his chest left foot and left tibia and fibula were without abnormalities.  Patient is not currently endorsing symptoms of alcohol withdrawal and was not started on CIWA  protocol during his ED course.  Given this patient's longstanding decompensation and homelessness it was  thought that he would be a poor candidate for outpatient management of his cellulitis and HIV.  Patient was admitted to medicine for further workup and management.    Medical Decision Making  Problems Addressed:  Cellulitis of left lower extremity: complicated acute illness or injury    Amount and/or Complexity of Data Reviewed  Labs: ordered.  ECG/medicine tests: ordered.    Risk  Prescription drug management.  Decision regarding hospitalization.        Medications received during this ED visit:  Medications   vancomycin (VANCOCIN) 1,250 mg in sodium chloride 0.9% 250 mL ADDaptor IVPB (1,250 mg Intravenous New Bag 09/14/23 0157)   enoxaparin (LOVENOX) syringe 40 mg/0.4 mL (has no administration in time range)   vancomycin (VANCOCIN) 750 mg in sodium chloride 0.9 % 150 mL IVPB (has no administration in time range)   cefepime (MAXIPIME) 2 g in sodium chloride 0.9% 100 mL IVPB ADDaptor (has no administration in time range)   thiamine (B-1) injection 200 mg (200 mg Intravenous Given 09/14/23 0212)     And   folic acid (FOLVITE) tablet 1 mg (1 mg Oral Given 09/14/23 0212)   ketorolac (TORADOL) injection 15 mg (15 mg Intravenous Given 09/13/23 1814)   ketorolac (TORADOL) injection 15 mg (15 mg Intravenous Given 09/14/23 0135)   cefTRIAXone (ROCEPHIN) 2 g in sodium chloride 0.9% 20 mL IV Push (2 g Intravenous Given 09/14/23 0148)       Impression     1. Cellulitis of left lower extremity         Plan   Admission  The patient is to be admitted in stable condition to medicine for further management of cellulitis and HIV management. The patient will continue to be monitored here in the ED until the time at which they are moved to their new treatment location.Workup, treatment and diagnosis were discussed with the patient and/or family members; the patient agrees to the plan and all questions were addressed and answered.      Alla Feeling, MD, MD, PGY-1  [facility]       Alla Feeling, MD  Resident  09/14/23  [phone]

## 2023-09-13 NOTE — ED Notes (Signed)
Patient remains in lobby while awaiting bed placement. Reassessed by this RN. Patient is alert and oriented, respirations unlabored, and no change in patients presenting condition at this time. Triage protocol orders placed and results reviewed. VS updated. No immediate action or escalation needed at this time. Patient updated on any delay and plan. Patient verbalizes understanding and denies additional needs at this time.  Will continue to monitor while waiting in Lobby.

## 2023-09-13 NOTE — Research Notes (Addendum)
Early Intervention Program Linkage Coordinator met with pt regarding both HIV and HCV. Pt stated that he does have a phone [phone]) that he shares with his friend. Pt will be linked to [facility] once discharged to coordinate HIV and HCV care.    Rhya [employer]  Early Intervention Program  [phone]

## 2023-09-13 NOTE — ED Notes (Signed)
Pt to [facility] c/o of LLE cellulitis that started 4 days ago that has gotten progressively worse. +redness + swelling - fevers   Pt endorses being able to put weight on extremity, but it is very painful.   Hx. HIV, Hep C, IVDU  Last fentanyl and etoh use 10/9

## 2023-09-13 NOTE — Other (Signed)
[facility] Provider Note  MEDICAL SCREENING EXAM    Date of Service: 09/13/2023    Reason for Visit: Leg Swelling, Wound Infection, and Chest Pain      MSE Plan     The patient was briefly evaluated from the lobby for a medical screening exam.  In short, this is a patient presenting with leg swelling, and chest pain.     To further evaluate their complaints, the following orders have been placed:    Labs Reviewed - No data to display  No orders to display       The following medication orders have been placed:  Medications - No data to display    Based on the patient's presentation, the patient has been identified as needing a bed, however no beds are currently available in our emergency department. The patient will be held in the lobby until a treatment space is made available.      Brief HPI     Alexander [name] is a 37 y.o. male presenting with leg swelling, and chest pain.  Patient reports he started noticing redness and swelling in his left leg 3 to 4 days ago.  He denies any recent injuries to his leg, scrapes, or bites.  He denies any fever or chills.  However he states he does have intermittent chest pain but no shortness of breath.  Patient denies any history of DVTs or PEs.  Patient is not on blood thinners currently.    Pertinent Physical Exam     ED Triage Vitals [09/13/23 1612]   Enc Vitals Group      BP (!) 151/102      Heart Rate 121      Resp 16      Temp 98 F (36.7 C)      Temp Source Oral      SpO2 98 %      Weight       Height       Head Circumference       Peak Flow       Pain Score       Pain Loc       Pain Education       Exclude from Growth Chart        Physical Exam  Vitals:    09/13/23 1612   BP: (!) 151/102   BP Location: Left upper arm   Patient Position: Sitting   BP Cuff Size: Small   Pulse: 121   Resp: 16   Temp: 98 F (36.7 C)   TempSrc: Oral   SpO2: 98%     General: 37 y.o.  male in no apparent distress, well developed, well nourished, non-toxic appearance.      HEENT: Atraumatic, normocephalic.     Neck:  Full range of motion.     Chest/pulm: Respiratory rate normal. Speaks in complete sentences, no respiratory distress.  Lungs are clear to auscultation bilaterally.  No rales, rhonchi, or wheezing.    Cardiovascular: Tachycardic.    Abdomen: No gross distension.      GU: Deferred.      Musculoskeletal: No obvious joint deformity.  Patient has good sensation of left foot and toes.         Neuro: A&O x 4.  Normal speech without dysarthria or aphasia. Moves all extremities spontaneously and symmetrically.       Psych: Appropriate mood and affect, normal interaction.    Past Medical History  Medical History:   Past Medical History:   Diagnosis Date    Anxiety     Bipolar disorder (CMS-HCC)     Bronchitis     Depression     Drug abuse and dependence (CMS-HCC)     H/O tooth extraction     Hepatitis C     HIV infection (CMS-HCC)     Liver disease Hep C    PTSD (post-traumatic stress disorder)     Substance induced mood disorder (CMS-HCC)     Thyroid disease      Surgical History:   Past Surgical History:   Procedure Laterality Date    MANDIBLE SURGERY      OPEN REDUCTION INTERNAL FIXATION MANDIBLE Bilateral 01/27/2021    Procedure: OPEN REDUCTION INTERNAL FIXATION MANDIBLE, MAXILLOMANDIBULAR FIXATION;  Surgeon: Ellery Plunk, MD;  Location: [facility] OR;  Service: ENT;  Laterality: Bilateral;     Medications: No current facility-administered medications for this encounter.    Current Outpatient Medications:     naloxone (NARCAN) 4 mg/actuation Spry, Apply 1 spray in one nostril if needed. Call 911. May repeat dose in other nostril if no response in 3 minutes., Disp: 2 each, Rfl: 0    ondansetron (ZOFRAN) 4 MG tablet, Take 1 tablet (4 mg total) by mouth every 8 hours as needed for Nausea., Disp: 30 tablet, Rfl: 11    TRIUMEQ 600-50-300 mg Tab, take one tablet by mouth daily, Disp: 30 tablet, Rfl: 1

## 2023-09-13 NOTE — ED Triage Notes (Signed)
Pt to [facility] for concern for left leg cellulitis. +redness +swelling. Pt denies any fevers. Pt states this has been going on for the past 4 days. Pt has concerns for liver cirrhosis, abd hernia. Hx of HIV & hep C. Pt also is having chest pain.

## 2023-09-14 ENCOUNTER — Inpatient Hospital Stay: Payer: PRIVATE HEALTH INSURANCE

## 2023-09-14 ENCOUNTER — Inpatient Hospital Stay
Admission: EM | Admit: 2023-09-14 | Discharge: 2023-09-18 | Disposition: A | Discharge status: Home / Self Care | Payer: PRIVATE HEALTH INSURANCE | Source: Other Acute Inpatient Hospital

## 2023-09-14 ENCOUNTER — Inpatient Hospital Stay: Admit: 2023-09-14 | Payer: PRIVATE HEALTH INSURANCE

## 2023-09-14 DIAGNOSIS — L03116 Cellulitis of left lower limb: Secondary | ICD-10-CM

## 2023-09-14 LAB — HIGH SENSITIVITY TROPONIN: High Sensitivity Troponin: 4 ng/L (ref 0–20)

## 2023-09-14 LAB — MISCELLANEOUS REFERENCE TEST: Test Name: 505370

## 2023-09-14 LAB — CBC
Hematocrit: 32.9 % — ABNORMAL LOW (ref 38.5–50.0)
Hemoglobin: 10.7 g/dL — ABNORMAL LOW (ref 13.2–17.1)
MCH: 26.9 pg — ABNORMAL LOW (ref 27.0–33.0)
MCHC: 32.6 g/dL (ref 32.0–36.0)
MCV: 82.5 fL (ref 80.0–100.0)
MPV: 8.6 fL (ref 7.5–11.5)
Platelets: 90 10*3/uL — ABNORMAL LOW (ref 140–400)
RBC: 3.99 10*6/uL — ABNORMAL LOW (ref 4.20–5.80)
RDW: 16 % — ABNORMAL HIGH (ref 11.0–15.0)
WBC: 5.5 10*3/uL (ref 3.8–10.8)

## 2023-09-14 LAB — HEPATITIS C RNA, QUANTITATIVE REFLEX TO GENOTYPING
IU log10: 4.85 {Log_copies}/mL
International Units: 70800 [IU]/mL

## 2023-09-14 LAB — QUANTIFERON TB2 AG: QuantiFERON TB2 Ag Value: 0.01

## 2023-09-14 LAB — URINE DRUG SCREEN WITHOUT CONFIRMATION, STAT
Amphetamine, 500 ng/mL Cutoff: NEGATIVE
Barbiturates UR, 300  ng/mL Cutoff: POSITIVE — AB
Benzodiazepines UR, 300 ng/mL Cutoff: NEGATIVE
Buprenorphine, 5 ng/mL Cutoff: NEGATIVE
Cocaine UR, 300 ng/mL Cutoff: POSITIVE — AB
Fentanyl, 2 ng/mL Cutoff: POSITIVE — AB
Methadone, UR, 300 ng/mL Cutoff: NEGATIVE
Opiates UR, 300 ng/mL Cutoff: NEGATIVE
Oxycodone, 100 ng/mL Cutoff: POSITIVE — AB
THC UR, 50 ng/mL Cutoff: NEGATIVE
Tricyclic Antidepressants, 300 ng/mL Cutoff: NEGATIVE

## 2023-09-14 LAB — CRYPTOCOCCUS ANTIGEN, SERUM: Crypto Ag, Ser: NEGATIVE

## 2023-09-14 LAB — HEPATIC FUNCTION PANEL
ALT: 40 U/L (ref 7–52)
AST: 116 U/L — ABNORMAL HIGH (ref 13–39)
Albumin: 2.6 g/dL — ABNORMAL LOW (ref 3.5–5.7)
Alkaline Phosphatase: 136 U/L — ABNORMAL HIGH (ref 36–125)
Bilirubin, Direct: 0.58 mg/dL — ABNORMAL HIGH (ref 0.00–0.40)
Bilirubin, Indirect: 0.62 mg/dL (ref 0.00–1.10)
Total Bilirubin: 1.2 mg/dL (ref 0.0–1.5)
Total Protein: 7.3 g/dL (ref 6.4–8.9)

## 2023-09-14 LAB — RENAL FUNCTION PANEL W/EGFR
Albumin: 2.6 g/dL — ABNORMAL LOW (ref 3.5–5.7)
Anion Gap: 5 mmol/L (ref 3–16)
BUN: 6 mg/dL — ABNORMAL LOW (ref 7–25)
CO2: 27 mmol/L (ref 21–33)
Calcium: 8.1 mg/dL — ABNORMAL LOW (ref 8.6–10.3)
Chloride: 98 mmol/L (ref 98–110)
Creatinine: 0.49 mg/dL — ABNORMAL LOW (ref 0.60–1.30)
EGFR: >90
Glucose: 113 mg/dL — ABNORMAL HIGH (ref 70–100)
Osmolality, Calculated: 268 mosm/kg — ABNORMAL LOW (ref 278–305)
Phosphorus: 3.2 mg/dL (ref 2.1–4.7)
Potassium: 3.5 mmol/L (ref 3.5–5.3)
Sodium: 130 mmol/L — ABNORMAL LOW (ref 133–146)

## 2023-09-14 LAB — LIPID PANEL
Cholesterol, Total: 51 mg/dL (ref 0–200)
HDL: 10 mg/dL — ABNORMAL LOW (ref 60–92)
LDL Cholesterol: 29 mg/dL
Non-HDL Cholesterol, Calculated: 41 mg/dL (ref 0–129)
Triglycerides: 58 mg/dL (ref 10–149)

## 2023-09-14 LAB — QUANTIFERON MITOGEN
QuantiFERON Interpretation: NEGATIVE
QuantiFERON Mitogen: 3.36

## 2023-09-14 LAB — URINALYSIS W/RFL TO MICROSCOPIC
Bilirubin, UA: NEGATIVE
Blood, UA: NEGATIVE
Glucose, UA: NEGATIVE mg/dL
Ketones, UA: NEGATIVE mg/dL
Leukocyte Esterase, UA: NEGATIVE
Nitrite, UA: NEGATIVE
Protein, UA: 30 mg/dL — AB
RBC, UA: 1 /HPF (ref 0–3)
Specific Gravity, UA: 1.024 (ref 1.005–1.035)
Squam Epithel, UA: 1 /HPF (ref 0–5)
Urobilinogen, UA: 12 mg/dL — ABNORMAL HIGH (ref 0.2–1.9)
WBC, UA: 2 /HPF (ref 0–5)
pH, UA: 6 (ref 5.0–8.0)

## 2023-09-14 LAB — HEPATITIS B SURFACE ANTIBODY, QUANTITATIVE
HBSAB NUMBER: 126 m[IU]/mL — ABNORMAL HIGH (ref 0.00–7.99)
Hep B S Ab: REACTIVE — AB

## 2023-09-14 LAB — PROTIME-INR
INR: 1.3 — ABNORMAL HIGH (ref 0.9–1.1)
Protime: 16.9 s — ABNORMAL HIGH (ref 12.1–15.1)

## 2023-09-14 LAB — BLOOD CULTURE-PERIPHERAL
Culture Result: NO GROWTH
Culture Result: NO GROWTH

## 2023-09-14 LAB — ED HCV AB REFLEX TO HCV QUANT
HCV Ab: REACTIVE — AB
HCVAB Number: 13 {s_co_ratio} — ABNORMAL HIGH (ref 0.00–0.79)

## 2023-09-14 LAB — HEPATITIS C RNA, RNA GENOTYPING ONLY: Hepatitis C Genotype: DETECTED

## 2023-09-14 LAB — QUANTIFERON NIL: QuantiFERON Nil: 0.01

## 2023-09-14 LAB — QUANTIFERON TB1 AG: QuantiFERON TB1 Ag Value: 0.01

## 2023-09-14 LAB — HEPATITIS C, RNA, QUANT
IU log10: 5.81 {Log_copies}/mL
International Units: 651000 [IU]/mL

## 2023-09-14 LAB — EPSTEIN-BARR VIRUS AB PANEL
EBV IGG NUM: 500 U/mL — ABNORMAL HIGH (ref 0.00–17.99)
EBV IGM NUM: <10 U/mL (ref 0.00–35.99)
EBV VCA IgG: POSITIVE — AB
EBV VCA IgM: NEGATIVE

## 2023-09-14 LAB — MAGNESIUM: Magnesium: 1.9 mg/dL (ref 1.5–2.5)

## 2023-09-14 LAB — BK VIRUS QUANTITATIVE BY PCR, BLOOD: BKV IU DNA Quant, Blood: NOT DETECTED [IU]/mL

## 2023-09-14 LAB — CYTOMEGALOVIRUS DNA, QUANT, RT PCR: CMV DNA Qnt: NOT DETECTED [IU]/mL

## 2023-09-14 LAB — HEMOGLOBIN A1C: Hemoglobin A1C: 4.9 % (ref 4.0–5.6)

## 2023-09-14 LAB — HEPATITIS A ANTIBODY TOTAL: Anti-HAV Total (IgG + IgM): NONREACTIVE

## 2023-09-14 LAB — HEPATITIS A IGM: Hep A IgM: NONREACTIVE

## 2023-09-14 LAB — HIV-1 RNA, QUANTITATIVE, PCR: HIV 1 Copies: <20 {copies}/mL

## 2023-09-14 LAB — HEPATITIS B SURFACE ANTIGEN: Hep B Surface Ag: NONREACTIVE

## 2023-09-14 MED ORDER — electrolyte-R (pH 7.4) (NORMOSOL-R pH 7.4) IV bolus 1,000 mL
Freq: Once | INTRAVENOUS | Status: AC
Start: 2023-09-14 — End: 2023-09-14
  Administered 2023-09-14: 14:00:00 via INTRAVENOUS

## 2023-09-14 MED ORDER — methadone (DOLOPHINE) 10 mg/mL oral concentrate 40 mg
10 | Freq: Every day | ORAL
Start: 2023-09-14 — End: 2023-09-15
  Administered 2023-09-14: 18:00:00 via ORAL

## 2023-09-14 MED ORDER — methocarbamoL (ROBAXIN) tablet 500 mg
500 | Freq: Four times a day (QID) | ORAL
Start: 2023-09-14 — End: 2023-09-18
  Administered 2023-09-14 – 2023-09-18 (×17): via ORAL

## 2023-09-14 MED ORDER — proCHLORPERazine (COMPAZINE) injection 5 mg
10 | Freq: Four times a day (QID) | INTRAMUSCULAR | PRN
Start: 2023-09-14 — End: 2023-09-18

## 2023-09-14 MED ORDER — cefepime (MAXIPIME) 2 g in sodium chloride 0.9% 100 mL IVPB ADDaptor
2 | Freq: Three times a day (TID) | INTRAMUSCULAR
Start: 2023-09-14 — End: 2023-09-14

## 2023-09-14 MED ORDER — PHENobarbital (LUMINAL) injection 65 mg
65 | INTRAMUSCULAR | PRN
Start: 2023-09-14 — End: 2023-09-14
  Administered 2023-09-14: 15:00:00 via INTRAVENOUS

## 2023-09-14 MED ORDER — folic acid (FOLVITE) tablet 1 mg
1 | Freq: Every day | ORAL | Status: AC
Start: 2023-09-14 — End: 2023-09-16
  Administered 2023-09-14 – 2023-09-16 (×3): via ORAL

## 2023-09-14 MED ORDER — nicotine polacrilex (NICORETTE) gum 4 mg
4 | BUCCAL | Status: AC | PRN
Start: 2023-09-14 — End: 2023-09-18
  Administered 2023-09-14 – 2023-09-17 (×2): via BUCCAL

## 2023-09-14 MED ORDER — vancomycin (VANCOCIN) 1,250 mg in sodium chloride 0.9% 250 mL ADDaptor IVPB
Freq: Once | INTRAVENOUS | Status: AC
Start: 2023-09-14 — End: 2023-09-14
  Administered 2023-09-14: 06:00:00 via INTRAVENOUS

## 2023-09-14 MED ORDER — abacavir-dolutegravir-lamivud (TRIUMEQ) 600-50-300 mg Tab
600-50-300 | ORAL_TABLET | Freq: Every day | ORAL | 2 refills
Start: 2023-09-14 — End: 2023-10-18
  Filled 2023-09-18: qty 30, 30d supply, fill #0

## 2023-09-14 MED ORDER — pantoprazole (PROTONIX) injection 40 mg
40 | Freq: Two times a day (BID) | INTRAVENOUS
Start: 2023-09-14 — End: 2023-09-18
  Administered 2023-09-14 – 2023-09-18 (×8): via INTRAVENOUS

## 2023-09-14 MED ORDER — gabapentin (NEURONTIN) capsule 300 mg
300 | Freq: Three times a day (TID) | ORAL
Start: 2023-09-14 — End: 2023-09-18
  Administered 2023-09-14 – 2023-09-18 (×12): via ORAL

## 2023-09-14 MED ORDER — cefTRIAXone (ROCEPHIN) 2 g in sodium chloride 0.9% 20 mL IV Push
2 | Freq: Once | INTRAMUSCULAR | Status: AC
Start: 2023-09-14 — End: 2023-09-14
  Administered 2023-09-14: 06:00:00 via INTRAVENOUS

## 2023-09-14 MED ORDER — nystatin (MYCOSTATIN) 100,000 unit/mL suspension 500,000 Units
100000 | Freq: After meals | ORAL
Start: 2023-09-14 — End: 2023-09-18
  Administered 2023-09-14 – 2023-09-18 (×16): via ORAL

## 2023-09-14 MED ORDER — enoxaparin (LOVENOX) syringe 40 mg/0.4 mL
40 | Freq: Every day | SUBCUTANEOUS
Start: 2023-09-14 — End: 2023-09-18
  Administered 2023-09-15 – 2023-09-18 (×4): via SUBCUTANEOUS

## 2023-09-14 MED ORDER — thiamine (B-1) injection 200 mg
100 | Freq: Three times a day (TID) | INTRAMUSCULAR | Status: AC
Start: 2023-09-14 — End: 2023-09-17
  Administered 2023-09-14 – 2023-09-17 (×8): via INTRAVENOUS

## 2023-09-14 MED ORDER — cefepime (MAXIPIME) 2 g in sodium chloride 0.9% 100 mL IVPB ADDaptor
2 | Freq: Three times a day (TID) | INTRAMUSCULAR
Start: 2023-09-14 — End: 2023-09-17
  Administered 2023-09-14 – 2023-09-17 (×9): via INTRAVENOUS

## 2023-09-14 MED ORDER — LORazepam (ATIVAN) tablet 2 mg
1 | ORAL | PRN
Start: 2023-09-14 — End: 2023-09-18
  Administered 2023-09-15 – 2023-09-16 (×2): via ORAL

## 2023-09-14 MED ORDER — nicotine (NICODERM CQ) 21 mg/24 hr 1 patch
21 | Freq: Every day | TRANSDERMAL
Start: 2023-09-14 — End: 2023-09-18
  Administered 2023-09-14 – 2023-09-18 (×5): via TRANSDERMAL

## 2023-09-14 MED ORDER — abacavir-dolutegravir-lamivud (TRIUMEQ) 600-50-300 mg tablet 1 tablet
600-50-300 | Freq: Every day | ORAL
Start: 2023-09-14 — End: 2023-09-18
  Administered 2023-09-15 – 2023-09-18 (×4): via ORAL

## 2023-09-14 MED ORDER — acetaminophen (TYLENOL) tablet 650 mg
325 | Freq: Three times a day (TID) | ORAL | PRN
Start: 2023-09-14 — End: 2023-09-18
  Administered 2023-09-14: 07:00:00 via ORAL

## 2023-09-14 MED ORDER — diazePAM (VALIUM) tablet 10 mg
5 | Freq: Three times a day (TID) | ORAL
Start: 2023-09-14 — End: 2023-09-16
  Administered 2023-09-14 – 2023-09-16 (×5): via ORAL

## 2023-09-14 MED ORDER — ketorolac (TORADOL) injection 15 mg
15 | Freq: Once | INTRAMUSCULAR | Status: AC
Start: 2023-09-14 — End: 2023-09-14
  Administered 2023-09-14: 06:00:00 via INTRAVENOUS

## 2023-09-14 MED ORDER — oxyCODONE (ROXICODONE) immediate release tablet 15 mg
15 | ORAL
Start: 2023-09-14 — End: 2023-09-15
  Administered 2023-09-14 – 2023-09-15 (×6): via ORAL

## 2023-09-14 MED ORDER — methadone (DOLOPHINE) 10 mg/mL oral concentrate 10 mg
10 | ORAL | Status: AC | PRN
Start: 2023-09-14 — End: 2023-09-15
  Administered 2023-09-15 (×2): via ORAL

## 2023-09-14 MED ORDER — LORazepam (ATIVAN) tablet 4 mg
2 | ORAL | PRN
Start: 2023-09-14 — End: 2023-09-18

## 2023-09-14 MED ORDER — PHENobarbital 650 mg in sodium chloride 0.9 % 100 mL
Freq: Once | INTRAVENOUS | Status: AC
Start: 2023-09-14 — End: 2023-09-14
  Administered 2023-09-14: 08:00:00 via INTRAVENOUS

## 2023-09-14 MED ORDER — vancomycin (VANCOCIN) 750 mg in sodium chloride 0.9 % 150 mL IVPB
Freq: Two times a day (BID) | INTRAVENOUS
Start: 2023-09-14 — End: 2023-09-15
  Administered 2023-09-14 – 2023-09-15 (×3): via INTRAVENOUS

## 2023-09-14 MED ORDER — oxyCODONE (ROXICODONE) immediate release tablet 5 mg
5 | Freq: Once | ORAL | Status: AC
Start: 2023-09-14 — End: 2023-09-14
  Administered 2023-09-14: 08:00:00 via ORAL

## 2023-09-14 MED FILL — OXYCODONE 15 MG TABLET: 15 15 MG | ORAL | Qty: 1

## 2023-09-14 MED FILL — VANCOMYCIN 1.25 GRAM INTRAVENOUS SOLUTION: 1.25 1.25 gram | INTRAVENOUS | Qty: 1

## 2023-09-14 MED FILL — METHOCARBAMOL 500 MG TABLET: 500 500 MG | ORAL | Qty: 1

## 2023-09-14 MED FILL — THIAMINE HCL (VITAMIN B1) 100 MG/ML INJECTION SOLUTION: 100 100 mg/mL | INTRAMUSCULAR | Qty: 2

## 2023-09-14 MED FILL — DIAZEPAM 5 MG TABLET: 5 5 MG | ORAL | Qty: 2

## 2023-09-14 MED FILL — PHENOBARBITAL SODIUM 65 MG/ML INJECTION SOLUTION: 65 65 mg/mL | INTRAMUSCULAR | Qty: 1

## 2023-09-14 MED FILL — OXYCODONE 5 MG TABLET: 5 5 MG | ORAL | Qty: 1

## 2023-09-14 MED FILL — NICOTINE (POLACRILEX) 2 MG GUM: 2 2 mg | BUCCAL | Qty: 2

## 2023-09-14 MED FILL — VANCOMYCIN 1,000 MG INTRAVENOUS INJECTION: 1000 1000 mg | INTRAVENOUS | Qty: 750

## 2023-09-14 MED FILL — FOLIC ACID 1 MG TABLET: 1 1 MG | ORAL | Qty: 1

## 2023-09-14 MED FILL — NICOTINE 21 MG/24 HR DAILY TRANSDERMAL PATCH: 21 21 mg/24 hr | TRANSDERMAL | Qty: 1

## 2023-09-14 MED FILL — METHADONE 10 MG/ML ORAL CONCENTRATE: 10 10 mg/mL | ORAL | Qty: 4

## 2023-09-14 MED FILL — PANTOPRAZOLE 40 MG INTRAVENOUS SOLUTION: 40 40 mg | INTRAVENOUS | Qty: 1

## 2023-09-14 MED FILL — TYLENOL 325 MG TABLET: 325 325 mg | ORAL | Qty: 2

## 2023-09-14 MED FILL — CEFTRIAXONE 2 GRAM SOLUTION FOR INJECTION: 2 2 gram | INTRAMUSCULAR | Qty: 1

## 2023-09-14 MED FILL — KETOROLAC 15 MG/ML INJECTION SOLUTION: 15 15 mg/mL | INTRAMUSCULAR | Qty: 1

## 2023-09-14 MED FILL — GABAPENTIN 300 MG CAPSULE: 300 300 MG | ORAL | Qty: 1

## 2023-09-14 MED FILL — PHENOBARBITAL SODIUM 130 MG/ML INJECTION SOLUTION: 130 130 mg/mL | INTRAMUSCULAR | Qty: 5

## 2023-09-14 MED FILL — CEFEPIME 2 GRAM SOLUTION FOR INJECTION: 2 2 gram | INTRAMUSCULAR | Qty: 1

## 2023-09-14 MED FILL — LORAZEPAM 2 MG TABLET: 2 2 MG | ORAL | Qty: 2

## 2023-09-14 MED FILL — ENOXAPARIN 40 MG/0.4 ML SUBCUTANEOUS SYRINGE: 40 40 mg/0.4 mL | SUBCUTANEOUS | Qty: 0.4

## 2023-09-14 MED FILL — NYSTATIN 100,000 UNIT/ML ORAL SUSPENSION: 100000 100,000 unit/mL | ORAL | Qty: 5

## 2023-09-14 NOTE — Consults (Signed)
Inpatient consult to Addiction Services  Consult performed by: Gaye Pollack, MD  Consult ordered by: Clide Deutscher, MD  Reason for consult: OUD and AUD assessment and treatment      The following clinical information related to substance use is CONFIDENTIAL and protected by [employer]. ACCESS TO THIS INFORMATION IS ON A NEED-TO-KNOW BASIS ONLY AND IS PROVIDED FOR THE PURPOSE OF ASSURING APPROPRIATE MEDICAL CARE. Federal regulations (42 CFR, Part 2) prohibit the release of this information without specific written consent of the patient. A general authorization for the release of medical information is NOT sufficient for the purpose of releasing the following information.     INPATIENT INITIAL ADDICTION MEDICINE CONSULT NOTE   Author: Gaye Pollack, MD  PCP: Herbert Pun, MD  Hospital Day: 0  Admit Date: 09/13/2023       HPI:   Alexander [name] is a 37 y.o. male with PMH of HIV, chronic HCV, AUD, OUD, presenting to hospital with LLE cellulitis, weight loss. Consulted for OUD/AUD assessment. Has been previously evaluated by our service. Consulted for OUD/AUD.        Fentanyl: injects 2 grams a day  Stimulants: cocaine intermittent  Alcohol: over 1 case per day of beer  Tobacco: 1.5 PPD  Benzo: no regular use    Patient reports he uses 2 grams a day, injects into his arm. He drinks way too much a case of beer a day. He would like to start methadone, has been to [facility] and would like to connect there again. Limited history as patient reports he feels terrible and did not want to talk much    Injection Drug use: yes  Opioid Overdose in the last year:  no    Signs of Opioid  use disorder in the last 12 months:  1) Recurrent use resulting in failure to fulfill major role obligations  yes  2) Recurrent use in physically hazardous situations  yes  3) Continued use despite recurrent social or interpersonal problems exacerbated by substance  yes  4) Tolerance:  yes  5) Withdrawal: yes  6) Taking substance in larger  amounts and longer than intended:  yes  7) Having a persistent desire or unsuccessful effort to cut down or control use:   yes  8) Spending a great deal of time to obtain substance or recover from its effects:   yes  9) Giving up important social, occupational, or recreational activities:  yes  10) Using the substance despite knowledge of having a persistent physical or psychological problem that has been caused by the substance:  yes  11) Craving  yes  TOTAL = 11  2-3 = mild, 4-5 = moderate, >6 severe    Signs of Alcohol Use disorder in the last 12 months:  1) Recurrent use resulting in failure to fulfill major role obligations  yes  2) Recurrent use in physically hazardous situations  yes  3) Continued use despite recurrent social or interpersonal problems exacerbated by substance  yes  4) Tolerance:  yes  5) Withdrawal: yes  6) Taking substance in larger amounts and longer than intended:  yes  7) Having a persistent desire or unsuccessful effort to cut down or control use:   yes  8) Spending a great deal of time to obtain substance or recover from its effects:   yes  9) Giving up important social, occupational, or recreational activities:  yes  10) Using the substance despite knowledge of having a persistent physical or  psychological problem that has been caused by the substance:  yes  11) Craving  yes  TOTAL = 0  2-3 = mild, 4-5 = moderate, >6 severe    OTHER SUBSTANCE USE HISTORY:  Past Tobacco use: yes  Current Tobacco use:  yes  Interested in quitting?  no  Past Etoh use:  yes  Current Etoh use:  yes  Past Illicit Drug use: yes  Current Illicit Drug use:  yes  Family Hx of substance use disorder:  yes  Active Substance use in household:  no  History of substance use disorder treatment: yes   Where and when? [facility] (tapered off per patient due to concurrent alcohol use) P, was on suboxone at another clini  Prescription Drug Monitoring checked:  yes, Appropriate?  Yes  Filled  Written  ID  Drug  QTY   Days  Prescriber  RX #  Dispenser  Refill  Daily Dose*  Pymt Type  PMP    Disclaimer       History of Trauma:  yes    ROS:  Withdrawal symptoms currently:  Chills/sweats Yes  N/V/D Yes  Muscle/joint aches Yes  Irritability Yes  Anxiety Yes    Hx of Mental Illness: Bipolar 1    PMH:  Past Medical History:   Diagnosis Date    Anxiety     Bipolar disorder (CMS-HCC)     Bronchitis     Depression     Drug abuse and dependence (CMS-HCC)     H/O tooth extraction     Hepatitis C     HIV infection (CMS-HCC)     Liver disease Hep C    PTSD (post-traumatic stress disorder)     Substance induced mood disorder (CMS-HCC)     Thyroid disease        INPATIENT MEDS:    Current Facility-Administered Medications:     acetaminophen (TYLENOL) tablet 650 mg, 650 mg, Oral, Q8H PRN, Clide Deutscher, MD, 650 mg at 09/14/23 0313    cefepime (MAXIPIME) 2 g in sodium chloride 0.9% 100 mL IVPB ADDaptor, 2 g, Intravenous, Q8H, Clide Deutscher, MD, Last Rate: 25 mL/hr at 09/14/23 0506, 2 g at 09/14/23 0506    enoxaparin (LOVENOX) syringe 40 mg/0.4 mL, 40 mg, Subcutaneous, Daily 0900, Clide Deutscher, MD    thiamine (B-1) injection 200 mg, 200 mg, Intravenous, 3 times per day, 200 mg at 09/14/23 0212 **AND** folic acid (FOLVITE) tablet 1 mg, 1 mg, Oral, Daily 0900, Clide Deutscher, MD, 1 mg at 09/14/23 8413    methocarbamoL (ROBAXIN) tablet 500 mg, 500 mg, Oral, QID, Clide Deutscher, MD    nicotine (NICODERM CQ) 21 mg/24 hr 1 patch, 1 patch, Transdermal, Daily 0900, 1 patch at 09/14/23 0309 **AND** nicotine (polacrilex) (NICORETTE) gum 4 mg, 4 mg, Buccal, Q1H PRN, Clide Deutscher, MD, 4 mg at 09/14/23 2440    nystatin (MYCOSTATIN) 100,000 unit/mL suspension 500,000 Units, 500,000 Units, Swish & Swallow, PC/HS, Clide Deutscher, MD    PHENobarbital (LUMINAL) injection 65 mg, 65 mg, Intravenous, Q2H PRN, Joesphine Bare, MD    vancomycin (VANCOCIN) 750 mg in sodium chloride 0.9 % 150 mL IVPB, 15 mg/kg, Intravenous, Q12H, Clide Deutscher, MD    Current  Outpatient Medications:     albuterol 90 mcg/actuation Inhl inhaler, Inhale 1 puff into the lungs every 6 hours as needed., Disp: , Rfl:     amLODIPine (NORVASC) 5 MG tablet, Take 1 tablet (5 mg total) by mouth daily., Disp: , Rfl:  naloxone (NARCAN) 4 mg/actuation Spry, Apply 1 spray in one nostril if needed. Call 911. May repeat dose in other nostril if no response in 3 minutes., Disp: 2 each, Rfl: 0    TRIUMEQ 600-50-300 mg Tab, take one tablet by mouth daily, Disp: 30 tablet, Rfl: 1    has no known drug allergies or adverse reactions.    SOCIAL HX:  Address: HOMELESS  [address]  [place] [address]  Homeless: Yes  The patient is single.   Has 0 child(ren)       PHYSICAL EXAM:  BP 105/69 (BP Location: Left upper arm, Patient Position: Lying, BP Cuff Size: Small)   Pulse 66   Temp 99.1 F (37.3 C) (Oral)   Resp 19   Wt 117 lb (53.1 kg)   SpO2 96%   BMI 17.28 kg/m   General Appearance: appears uncomfortable  HEENT: clear sclera, EOMI, clear speech  Thorax: Normal respiratory effort, talks in full sentences  Abdomen: Non-distended  Extremitites: No edema noted  Musculoskeletal: Moves all extremities spontaneously  Skin:  Skin red  Psych: linear thoughts, pleasant  Neuro: Alert         DATA:  Last Drug Screen:      UDS available for review: No; Positive for:   []  benzodiazepines, []  barbiturates, []  cocaine, []  THC, []  amphetamines,   []  methamphetamines, []  opiates, []  oxycodone, []  fentanyl, []  methadone,  []  buprenorphine, []  MDMA, []  PCP    01/2021: + cocaine and fentanyl  12/2013: + oxycodone    Complete Blood Count:    Recent Labs     09/13/23  1814 09/14/23  0507   WBC 9.4 5.5   HGB 12.2* 10.7*   HCT 36.5* 32.9*   MCV 82.0 82.5   PLT 74* 90*       Basic Metabolic Panel:    Recent Labs     09/13/23  1814 09/14/23  0507   NA 130* 130*   K 4.0 3.5   CL 96* 98   CO2 27 27   BUN 5* 6*   MG  --  1.9   PHOS  --  3.2       Hepatic Panel:    Recent Labs     09/13/23  1814 09/14/23  0507   AST 115* 116*    ALT 46 40   ALBUMIN 3.2* 2.6*  2.6*        __________________________________________________   IMPRESSION:  Alexander [name] is a 37 y.o. male with PMH of HIV, chronic HCV, AUD, OUD, presenting to hospital with LLE cellulitis, weight loss. Consulted for OUD/AUD assessment. Has been previously evaluated by our service, reports very high opioid tolerance 2 grams of IV fentanyl, and that he feels miserable form withdrawal from fentanyl and alcohol. He does not want buprenorphine, and only amenable to methadone.  He would like to reconnect to [facility].    Since pt has already received phenobarb, it's long acting and induces methadone metabolism, it may be challenging go manage his opioid withdrawal with methadone alone. In addition, we will be more limited with dose adjustments due to risk of dose stacking as phenobarb wears loff. , If possible switch to benzos for alcohol withdrawal.  Recommend keeping on short acting oxycodone 15mg  Q3H PRN for COWs > 8, this, dose can be increased to oxycodone 20mg  PRN    Social Issues Addressed/Barriers to Care: unhoused    Diagnosis:  #Severe Alcohol Use Disorder  #Severe Opioid Use  Disorder  #Opioid withdrawal  #Nicotine Dependence  #Cocaine Use    Discussed risks, benefits, and options for treatment, expectations for treatment, and limitations of confidentiality. Provided psychoeducation about opioid use disorder, use of medications, effect of substances on mental and physical health, use of 12-step or other peer support groups and techniques for relapse prevention.      RECOMMENDATIONS:  -check T pallidum  -obtain urine drug test   -start methadone 40mg  now, can give 10 mg Q4H PRN for COWS > 8 up to 2 doses (max of 60mg  TDD today)  -start oxycodone 15mg  Q3H PRN for COWS > 8 or opioid cravings, dose can be increased (use if patient is not able to get methadone dose))  -start NRT patch and 4mg  lozenge  -recommend since patient prefers methadone, recommend switching to benzos  for alcohol withdrawal as phenobarb can induce methadone metabolism  -Recommend IV thiamine 200mg  Q8H x3 days, followed by 100mg  IV QD x4 days (decreased PO absorption for individuals w/AUD)  -Recommend PO folic acid daily  -IN narcan on discharge      THESE RECOMMENDATIONS ARE PRELIMINARY UNTIL SIGNED BY THE ATTENDING    General recommendations for patients with substance use disorder  -per CDC guidelines, offer Hep A vaccine who uses injection or non-injection drugs, is experiencing homelessness, or has chronic liver disease  -if patient HCV positive, consider Hep A and Hep B vaccines as needed  -all patients with any opioid or stimulant use should be discharged with a prescription for intranasal naloxone    Len Blalock, MD MHS  Addiction Medicine Internal Medicine  Behavioral and Addiction Sciences Department   [facility]  Pager: [phone]

## 2023-09-14 NOTE — ED Notes (Signed)
Pt resting comfortably in bed. Seizure precautions set up. VSS on RA. Patient on continuous cardiac and Sp02 monitoring. Bed in lowest and locked position, call light in reach. No obvious signs of distress, equal rise and fall of chest. Will continue to monitor.

## 2023-09-14 NOTE — Consults (Signed)
[facility]  GI CONSULT NOTE    Name: Alexander [name]  MRN: [id number]  CSN: [id number]    Consulted by: Emmie Niemann*    Chief Complaint: Dysphagia/odynophagia    History of Present Illness:  Alexander [name] is a 37 y.o. history of HIV not on HAART, HCV, polysubstance use including IVDU, and severe alcohol dependence, presenting to the hospital with left leg redness and swelling. GI was consulted for dysphagia/odynophagia.    Patient notes 40 lb weight loss over the last few months with associated night sweats. Has been having significantly painful swallowing and feeling like contents are getting stuck when swallowing. Patient injects fentanyl daily and drinks 10-12 drinks of 24oz of 8% alcohol daily, last drink 10/9 at 1330. Notes occasional cocaine use and smokes tobacco.     Patient also subjectively is having nausea and vomiting and notes throwing up blood several times per week. Baseline Hgb 14 in 2022, was 12.2 on arrival and has downtrended to 10.7 this morning. Has significant thrombocytopenia to 74 and mildly elevated INR. Abdominal ultrasound showed splenomegaly to 16 cm and trace ascites, though liver contour appeared normal. Fibroscan 2018 with F0/F1 fibrosis.     Review of Systems   All systems reviewed and negative except as listed above in HPI.    Past Medical History:   Diagnosis Date    Anxiety     Bipolar disorder (CMS-HCC)     Bronchitis     Depression     Drug abuse and dependence (CMS-HCC)     H/O tooth extraction     Hepatitis C     HIV infection (CMS-HCC)     Liver disease Hep C    PTSD (post-traumatic stress disorder)     Substance induced mood disorder (CMS-HCC)     Thyroid disease       Past Surgical History:   Procedure Laterality Date    MANDIBLE SURGERY      OPEN REDUCTION INTERNAL FIXATION MANDIBLE Bilateral 01/27/2021    Procedure: OPEN REDUCTION INTERNAL FIXATION MANDIBLE, MAXILLOMANDIBULAR FIXATION;  Surgeon: Ellery Plunk, MD;  Location: [facility] OR;  Service: ENT;   Laterality: Bilateral;      Family History   Problem Relation Age of Onset    Drug abuse Brother     Suicidality Brother     Hypertension Mother     Transient ischemic attack Mother     Seizures Father     Hypertension Father     Cancer Father     Breast Cancer Father     Suicidality Father     Drug abuse Brother     Suicidality Brother       Social History     Tobacco Use    Smoking status: Every Day     Current packs/day: 1.00     Average packs/day: 1 pack/day for 11.0 years (11.0 ttl pk-yrs)     Types: Cigarettes    Smokeless tobacco: Never   Substance Use Topics    Alcohol use: Yes     Comment: a case of beer everyday      No Known Drug Allergies or Adverse Reactions     Scheduled Meds:   ceFEPime (MAXIPIME) IV extended infusion  2 g Intravenous Q8H    enoxaparin  40 mg Subcutaneous Daily 0900    thiamine (vitamin B1) IV orderable  200 mg Intravenous 3 times per day    And    folic acid  1 mg Oral Daily  0900    gabapentin  300 mg Oral TID    methadone  40 mg Oral Daily 0900    methocarbamoL  500 mg Oral QID    nicotine  1 patch Transdermal Daily 0900    nystatin  500,000 Units Swish & Swallow PC/HS    oxyCODONE  15 mg Oral Q4H2 SCH    vancomycin  15 mg/kg Intravenous Q12H     Continuous Infusions:    PRN Meds:  acetaminophen, methadone, nicotine **AND** nicotine polacrilex, PHENobarbital, proCHLORPERazine    Prior to Admission Meds:  (Not in a hospital admission)      Vitals:  Temp:  [98 F (36.7 C)-99.1 F (37.3 C)] 99.1 F (37.3 C)  Heart Rate:  [66-121] 95  Resp:  [11-20] 18  BP: (103-152)/(59-102) 152/98    Intake/Output Summary (Last 24 hours) at 09/14/2023 1135  Last data filed at 09/14/2023 0420  Gross per 24 hour   Intake --   Output 200 ml   Net -200 ml       Physical Exam   Gen: NAD, resting in bed   HEENT: anicteric sclera, EOMI  Neck: Neck is supple.   CV: RRR, +S1/S2, no m/r/g appreciated  Lungs: CTAB, no wheezes/rales/rhonchi  Abdomen: Soft, nontender, nondistended, +bowel sounds  Extremities:  No bilateral lower extremity edema, warm and well-perfused  Skin: No bruising or rash noted.   Neuro: A&Ox3, no focal neuro deficits appreciated  Psych: appropriate mood and affect    Laboratory:  Lab Results   Component Value Date    WBC 5.5 09/14/2023    HGB 10.7 (L) 09/14/2023    HCT 32.9 (L) 09/14/2023    MCV 82.5 09/14/2023    PLT 90 (L) 09/14/2023     Lab Results   Component Value Date    NA 130 (L) 09/14/2023    K 3.5 09/14/2023    CL 98 09/14/2023    CO2 27 09/14/2023    BUN 6 (L) 09/14/2023    CREATININE 0.49 (L) 09/14/2023    GLUCOSE 113 (H) 09/14/2023    CALCIUM 8.1 (L) 09/14/2023    PHOS 3.2 09/14/2023     Lab Results   Component Value Date    AST 116 (H) 09/14/2023    ALT 40 09/14/2023    BILITOT 1.2 09/14/2023    BILIDIRECT 0.58 (H) 09/14/2023    PROT 7.3 09/14/2023    ALBUMIN 2.6 (L) 09/14/2023    ALBUMIN 2.6 (L) 09/14/2023    ALKPHOS 136 (H) 09/14/2023     Lab Results   Component Value Date    INR 1.3 (H) 09/14/2023        Lab Results   Component Value Date    AMYLASE 49 01/13/2012     Lab Results   Component Value Date    LIPASE 57 01/24/2021       Lab Results   Component Value Date    HEPAIGM Nonreactive 02/20/2017    HEPBCAB Nonreactive 04/01/2020     No results found for: ANA  No results found for: SMOOTHMUSCAB  No results found for: IRON, TIBC, FERRITIN  No results found for: OCCULTBLD    No results found for: AFP  No results found for: CEA  No results found for: CA125    Lab Results   Component Value Date    VITAMINB12 328 10/17/2017     Lab Results   Component Value Date    FOLATE 11.30 07/02/2014  Microbiology:   No results found for: AEROBOT, ANABOT, LABGRAM, ISO2, ISO3, ISO4, ISO5, ISO6, PNAFISH    Images:  US Abdomen Complete    Result Date: 09/14/2023  EXAM: US ABDOMEN COMPLETE INDICATION:  Elevated LFTs COMPARISON:  CT 01/13/2012, 01/24/2021 TECHNIQUE: Grayscale imaging was performed for evaluation of the liver, gallbladder, common bile duct,  pancreas, spleen, and kidneys; with limited grayscale and color Doppler evaluation of the upper abdominal aorta and inferior vena cava. FINDINGS: Liver: Normal contour and echogenicity.  No focal hepatic lesions. Biliary/CBD: 5 mm. No intra or extrahepatic ductal dilatation. Gallbladder: Cholelithiasis.  No wall thickening or pericholecystic fluid. Pancreas: Visualized portions are unremarkable. Right kidney: 12.5 cm in length.  Normal parenchymal echogenicity.  No hydronephrosis. Left kidney: 13 cm in length.  Normal parenchymal echogenicity.  No hydronephrosis. Spleen: 16 cm. Aorta and IVC: Visualized portions of the abdominal aorta are not aneurysmal. The retrohepatic IVC is color Doppler patent. Other: Trace free intraperitoneal fluid.     IMPRESSION: Cholelithiasis without CT evidence of acute cholecystitis. Trace ascites. Splenomegaly. Report Verified by: Nada Boozer, MD at 09/14/2023 9:06 AM EDT    X-ray Chest PA and Lateral    Result Date: 09/13/2023  EXAM: XR CHEST PA AND LATERAL INDICATION: Chest pain, unspecified TECHNIQUE: 2 views of the chest. COMPARISON: None. FINDINGS: Medical Devices: None. Heart and Mediastinum: Cardiomediastinal silhouette is within normal limits. Lungs and Pleura: Lungs are clear with no pleural effusions or evidence of pneumothorax. Bones and Soft tissues: No acute abnormalities.     IMPRESSION: No acute cardiopulmonary abnormality. Report Verified by: Leda Roys, MD at 09/13/2023 7:00 PM EDT    X-ray Foot Left min 3-views    Result Date: 09/13/2023  EXAM: XR FOOT LEFT MINIMUM 3-VIEWS EXAM: XR TIBIA FIBULA LEFT MINIMUM 2-VIEWS INDICATION: Swelling COMPARISON: None. FINDINGS:  There is no evidence of fracture, malalignment, or other acute abnormality. There is extensive diffuse soft tissue swelling. No radiopaque foreign bodies are identified.     IMPRESSION: No acute osseous abnormality Report Verified by: Leda Roys, MD at 09/13/2023 6:34 PM EDT    X-ray  Tibia-Fibula Left min 2-views    Result Date: 09/13/2023  EXAM: XR FOOT LEFT MINIMUM 3-VIEWS EXAM: XR TIBIA FIBULA LEFT MINIMUM 2-VIEWS INDICATION: Swelling COMPARISON: None. FINDINGS:  There is no evidence of fracture, malalignment, or other acute abnormality. There is extensive diffuse soft tissue swelling. No radiopaque foreign bodies are identified.     IMPRESSION: No acute osseous abnormality Report Verified by: Leda Roys, MD at 09/13/2023 6:34 PM EDT     Assessment/Plan:    Alexander [name] is a 37 y.o. history of HIV not on HAART, HCV, polysubstance use including IVDU, and severe alcohol dependence, presenting to the hospital with left leg redness and swelling, found to have LLE cellulitis. GI was consulted for dysphagia/odynophagia and potential hematemesis.    #Dysphagia/Odynophagia  #?Hematemesis  #HIV not on HAART  #Unintentional weight loss  #Severe alcohol use disorder  #Splenomegaly  #Thrombocytopenia  #Anemia  #Chronic hepatitis C    Patient presents with LLE cellulitis, known HIV patient that has been out of his HAART for at least two months. Also notes 40 lbs of weight loss unintentionally and worsening dysphagia/odynophagia. Concern for infectious esophagitis in the setting of his HIV. Patient also with longstanding alcohol use disorder, drinking up to a case of alcohol daily with findings on ultrasound concerning for liver fibrosis/cirrhosis include splenomegaly and ascites with thrombocytopenia and elevated INR on his  labs. He subjectively has had ongoing hematemesis and his Hgb has dropped since admission. Unfortunately, the patient has had a diet since this morning and has eaten recently.    RECOMMENDATIONS:  -Place and maintain two large bore IV's, type and screen, trend H/H  -IV PPI BID  -Transfuse to maintain Hgb > 7 g/dL  -Please make NPO at midnight and hold anticoagulation  -Will plan for EGD tomorrow      Thank you for allowing [place] to assist with the care of Alexander [name]. We  will continue to follow along. If you have any questions, please call the GI fellow on-call.    Plan to be discussed with Dr. Debbe Bales, recommendations final after attending attestation.     Lynann Beaver, DO  PGY-4  Division of Gastroenterology  09/14/2023 11:35 AM

## 2023-09-14 NOTE — Care Coordination-Inpatient (Signed)
[facility]  Case Management/Social Work Department Brief Assessment    Re-Admission within 30 days   Planned or unplanned? unplanned  If planned: Reason?  N/A  If unplanned: Reason? LLE cellulitis  Discuss with patient any barriers to prevent re-admission?     Patient Information   Admission diagnosis: cellulitis  Demographic verified and updated as needed    Support Tour manager (POA or Next of Kin):  [name] Schellinger   Phone #: [phone]   Relationship:  Mother*  *SW to meet with patient and confirm this is his LNOK;      Living Arrangements Prior to Hospitalization      Per chart review, patient is homeless and arrived at ED for concerns for cellulitis in his leg; patient also utlizes the following substances: IV fentanyl, cocaine, etoh & tobacco.           Programmer, applications Prior to Hospitalization   Home Health Care Services and Phone #  N/A    Home Respiratory Services:  N/A   Provider and Phone #  Home O2:       Baseline Liter Flow:   BIPAP/CPAP:  Nebulizer:     Durable Medical Equipment:  N/A    Patient is homeless; SW CM to follow up with patient and complete a fully psychosocial assessment.    Discharge Plan   Anticipated discharge plan: TBD    Company Name(s) and Phone # N/A      Anticipated discharge date: 09/17/2023      Transportation at Discharge: TBD      Please contact CM/SW for any further discharge planning needs.    Patient/Family aware and taking part in the discharge plan.  Patient/family educated that once post-acute care needs have been identified, a provider list applicable to the identified post-acute care needs as well as the insurance provider will be provided, and patient/family have the freedom to choose their provider(s); financial interest(s) are disclosed as appropriate.    Elliot Gault MSW LSW  [phone]

## 2023-09-14 NOTE — Progress Notes (Signed)
[facility]: Vancomycin Monitoring Consult       Alexander [name] is a 37 y.o. male currently being treated for cellulitis.    Patient has no known drug allergies or adverse reactions.    Pharmacy consulted for vancomycin management by [facility].      Current Anti-Infectives         Dose Frequency Start End    cefepime (MAXIPIME) 2 g in sodium chloride 0.9% 100 mL IVPB ADDaptor 2 g Every 8 hours 09/14/2023 --    Admin Instructions: **DOUBLE CHECK LENGTH OF INFUSION BEFORE ADMINISTRATION**  Use ADDaptor product - Mix Thoroughly Before Administration    Route: Intravenous    cefTRIAXone (ROCEPHIN) 2 g in sodium chloride 0.9% 20 mL IV Push (Completed) 2 g Once 09/14/2023 09/14/2023    Admin Instructions: ADMINISTER IV PUSH. Infuse over 5 minutes. Draw up 20 mL Sodium Chloride 0.9% into empty syringe. Inject 20 mL into vial of Ceftriaxone. Shake well. Withdraw volume into syringe and administer immediately.    Route: Intravenous    nystatin (MYCOSTATIN) 100,000 unit/mL suspension 500,000 Units 500,000 Units 30 min after meals and bedtime 09/14/2023 --    Route: Swish & Swallow    vancomycin (VANCOCIN) 1,250 mg in sodium chloride 0.9% 250 mL ADDaptor IVPB (Completed) 25 mg/kg  53.1 kg Once 09/14/2023 09/14/2023    Admin Instructions: Contact pharmacy if there is a question/concern of whether vancomycin should be given based on serum drug levels.  Use ADDaptor product - Mix Thoroughly Before Administration    Route: Intravenous    vancomycin (VANCOCIN) 750 mg in sodium chloride 0.9 % 150 mL IVPB 15 mg/kg  53.1 kg Every 12 hours 09/14/2023 --    Admin Instructions: Contact pharmacy if there is a question/concern of whether vancomycin should be given based on serum drug levels.    Route: Intravenous          documented within (last 72 hours)       Date/Time Action Medication Dose Rate    09/14/23 0157 New Bag    vancomycin (VANCOCIN) 1,250 mg in sodium chloride 0.9% 250 mL ADDaptor IVPB  1,250 mg 200 mL/hr            --Objective Data--     Vitals:    09/14/23 0408 09/14/23 0424 09/14/23 0528 09/14/23 0607   BP: 103/59 131/80 104/70 105/69   Pulse: 77 79 73 66   Resp: 12 11 20 19    Temp:       TempSrc:       SpO2: 100% 100% 98% 96%   Weight:           I/O last 3 completed shifts:  In: -   Out: 200 [Urine:200]    WBC, BUN, Creatinine  (Last 7 days)        Today 0507 Yesterday 1814    WBC             5.5                       9.4              BUN             6                       5              Creatinine  0.49                       0.37                      Patient height not recorded    --Cultures--     Microbiology Results       Date and Time Order Name Sensitivity Status Organisms Specimen ID Source    09/14/2023  4:05 AM #2  Blood culture-Peripheral site 2  Preliminary  [id number] Peripheral    09/14/2023  1:39 AM #1  Blood culture-Peripheral site 1  Preliminary  [id number] Peripheral            --Vancomycin Concentrations--     Lab Results  (Last 7 days)   No relevant labs found          --Assessment and Plan--        Patient is a 37 y.o. male being treated with vancomycin for cellulitis.    Based on patients age and renal function, will start vancomycin 1250mg  x 1, then 750mg  IV q12h.  Will check vancomycin serum concentration prior to 4th dose of current regimen.  Goal vancomycin trough of 10-15 mg/L.  Pharmacy will continue to monitor therapy for efficacy and toxicity.       Thank you for the consult.    A. Daiton Cowles, PharmD, BCPS, CTTS  Clinical Pharmacy Specialist, Neurology / Internal Medicine  Preferred contact: Epic Secure Chat  Phone: [phone]

## 2023-09-14 NOTE — ED Notes (Signed)
Admit team at bedside.

## 2023-09-14 NOTE — H&P (Signed)
Department of Internal Medicine  History & Physical    Patient: Alexander [name]  MRN: [id number]    Chief Complaint     Left leg redness and swelling    History of Present Illness     Alexander [name] is a 37 y.o. male with a history of HIV, HCV, polysubstance use including IVDU, and alcohol dependence, presenting to the hospital with left leg redness and swelling.    The patient reports that he started noticing left leg redness and swelling starting 4 days prior to arrival. The redness and swelling, along with associated leg pain, has been traveling up his leg. Additionally notes some numbness of the dorsal foot that started today. Range of motion has been limited due to pain. He attempted antibiotic ointment and ibuprofen without success. No recent injury to the left leg, reports that he does not inject in his leg. Previously had a dog bite to the left leg 1.5 years ago. The patient has not had fever or chills. Reports intermittent chest and bilateral back pain, also notes intermittent cough.    Additionally, the patient notes 40 lb weight loss over the past few months and night sweats. He has had reduced appetite, painful swallowing, and difficulty swallowing. Some improvement with mouth pain after receiving nystatin mouthwash from [facility] medicine team. He has not been consistently taking ART, last medication fill he reports was ~2 months ago and he did not take all doses. The patient has been experiencing homelessness but notes that he is working on obtaining an apartment. He injects fentanyl daily (reports about 2mg  in a 24h period) and drinks 10-12 drinks of 24oz of 8% alcohol, last drink 10/9 at 1330. Occasional cocaine use. Smokes tobacco.    On arrival to the ED the patient was tachycardic and HDS, temperature 74F. Imaging without findings of subcutaneous emphysema or osteomyelitis. Labs notable for WBC 9.4, Hgb 12.2, LFT showing liver injury with elevated transaminases, INR 1.2, hyponatremia, troponin 8 >  4, BNP 121. Blood cultures drawn.    Review of Systems     10 point ROS was performed and was negative unless otherwise noted above    Past Medical and Social History       Past Medical History:   Diagnosis Date    Anxiety     Bipolar disorder (CMS-HCC)     Bronchitis     Depression     Drug abuse and dependence (CMS-HCC)     H/O tooth extraction     Hepatitis C     HIV infection (CMS-HCC)     Liver disease Hep C    PTSD (post-traumatic stress disorder)     Substance induced mood disorder (CMS-HCC)     Thyroid disease      Past Surgical History:   Procedure Laterality Date    MANDIBLE SURGERY      OPEN REDUCTION INTERNAL FIXATION MANDIBLE Bilateral 01/27/2021    Procedure: OPEN REDUCTION INTERNAL FIXATION MANDIBLE, MAXILLOMANDIBULAR FIXATION;  Surgeon: Ellery Plunk, MD;  Location: [facility] OR;  Service: ENT;  Laterality: Bilateral;     Family History   Problem Relation Age of Onset    Drug abuse Brother     Suicidality Brother     Hypertension Mother     Transient ischemic attack Mother     Seizures Father     Hypertension Father     Cancer Father     Breast Cancer Father     Suicidality Father  Drug abuse Brother     Suicidality Brother      Social History     Tobacco Use    Smoking status: Every Day     Current packs/day: 1.00     Average packs/day: 1 pack/day for 11.0 years (11.0 ttl pk-yrs)     Types: Cigarettes    Smokeless tobacco: Never   Substance Use Topics    Alcohol use: Yes     Comment: a case of beer everyday    Drug use: Yes     Types: Heroin, Other-see comments, Marijuana, Cocaine     Comment: fentanyl -- 10/0/24        Home Medications     Prior to Admission medications as of 03/09/21 1008   Medication Sig Taking?   naloxone (NARCAN) 4 mg/actuation Spry Apply 1 spray in one nostril if needed. Call 911. May repeat dose in other nostril if no response in 3 minutes.    ondansetron (ZOFRAN) 4 MG tablet Take 1 tablet (4 mg total) by mouth every 8 hours as needed for Nausea.    TRIUMEQ 600-50-300 mg Tab  take one tablet by mouth daily        No Known Drug Allergies or Adverse Reactions  Physical Exam     Temp:  [98 F (36.7 C)-99.1 F (37.3 C)] 99.1 F (37.3 C)  Heart Rate:  [85-121] 85  Resp:  [12-16] 12  BP: (137-151)/(95-102) 137/102  }Physical Exam  Vitals reviewed.   Constitutional:       General: He is not in acute distress.     Appearance: He is ill-appearing (chronically).   HENT:      Mouth/Throat:      Mouth: Mucous membranes are moist.      Comments: Angular cheilitis. No thrush appreciated. Erythematous oropharyngeal mucosa.  Eyes:      Extraocular Movements: Extraocular movements intact.      Conjunctiva/sclera: Conjunctivae normal.      Pupils: Pupils are equal, round, and reactive to light.   Cardiovascular:      Rate and Rhythm: Regular rhythm. Tachycardia present.      Heart sounds: Murmur (soft systolic) heard.   Pulmonary:      Effort: Pulmonary effort is normal. No respiratory distress.      Breath sounds: Normal breath sounds.   Abdominal:      General: There is no distension.      Palpations: Abdomen is soft.      Tenderness: There is abdominal tenderness (mild diffuse tenderness).      Comments: Scaphoid abdomen. + Right inguinal hernia, easily reducible, non tender.   Musculoskeletal:      Cervical back: Normal range of motion.      Right lower leg: No edema.      Left lower leg: Edema present.      Comments: LLE ROM reduced d/t pain.   Skin:     General: Skin is warm and dry.      Findings: Erythema (LLE, see media tab) present.   Neurological:      Mental Status: He is alert and oriented to person, place, and time. Mental status is at baseline.      Comments: Mild dysarthria. Limited L hand extension.   Psychiatric:         Mood and Affect: Mood normal.         Behavior: Behavior normal.         Diagnostic Studies     Labs: I have  personally reviewed labs.    I have personally reviewed imaging studies.    Assessment & Plan     Alexander [name] is a 37 y.o. with a history of HIV, HCV,  polysubstance use including IVDU and alcohol dependence, presenting to the hospital with left leg cellulitis.    #LLE cellulitis  Immunosuppressed patient with progressing LLE cellulitis, marked with pen at time of admission. No apparent inciting event, of note this limb sustained a dog bite 1.5 years ago. Concern this could be progressing to systemic infection, though patient hemodynamically stable on admission with lactate 1.2. Imaging and exam not concerning for necrotizing infection at this time. Blood cultures drawn and started on broad spectrum empiric antibiotics.   - follow up blood cultures  - Cefepime/vancomycin  - monitor LLE for progressing cellulitis  - pain control: tylenol 650 mg q8h, spot dose oxycodone 5mg     #HIV, not on ART  #40 lb weight loss  #Dysphagia/odynophagia  Patient last follow up visit with ID in 2022, was previously intermittently on triumeq but was not consistently taking ART. Has not been taking ART consistently for at least a year, reports most recent fill of medication was 2 months ago and notes that he has not taken all his medication. Has experienced 40 lb weight loss in last few months. Has been having decreased appetite, difficulty swallowing and pain/burning sensation in his mouth and throat. Has been taking mouthwash, presumably nystatin, for this that he received from the [facility].  - lymphocyte subset panel, HIV quant ordered  - early intervention program linkage coordinator met with patient, linked with [facility]  - fluconazole course considered, however avoided d/t interaction with phenobarbital  - continue nystatin mouthwash  - ID consult  - Consider GI consult for dysphagia/odynophagia    #Polysubstance use  #Alcohol dependence  #Tobacco use  Patient with history of alcohol withdrawal, last drink 1330 10/9. Drinks 10-12 cans of 24 oz 8% alcohol a day. Has experienced withdrawal and seizures in the past. Last drink 1330 on 10/9. Currently injecting fentanyl daily,  reports about 2mg  IV fentanyl use every 24 hours.  - CIWA, COWS  - addiction medicine consult offered and accepted  - initiated phenobarbital, discussed with pharmacy  - seizure precautions  - NRT    #HCV  Initial HCV Ab reactive in 2014. Patient has not received treatment. LFTs consistent with chronic liver disease, stable from previous. CT abdomen 2022 with IV contrast showed hepatic steatosis without focal lesions.  - US abdomen ordered  - Recommend ID/GI management outpatient  - 2g tylenol daily limit      Orders Placed This Encounter   Procedures    Diet Regular(7)     DVT prophylaxis: Enoxaparin (prophylaxis)  Code Status: Prior      Malissa Hippo, MD  09/14/2023 1:44 AM

## 2023-09-14 NOTE — ED Notes (Signed)
1st set of Bcx sent

## 2023-09-14 NOTE — ED Notes (Addendum)
Pt to Ultrasound

## 2023-09-14 NOTE — ED Notes (Signed)
Pt c/o of 8/10 lower back and bilateral lower extremity pain. Covering team paged and notified of pt request for pain medication.

## 2023-09-14 NOTE — Consults (Signed)
Department of Infectious Diseases  Consult Note  09/14/2023     Referring Physician: Emmie Niemann*   Patient's Name: Alexander [name]  MRN: [id number]  CSN: [id number]    Reason for Consult      HIV and Hep C patient off treatment <1 year with cellulitis, oral lesions, night sweats, recent 40 pound weight loss     Assessment & Recommendations     ZOLAN UTTLEY is a 37 y.o. male with a history of HIV diagnosed with HIV on 02/05/17 on Triumeq not been consistently taking it last time took his medicine in 2 months, HCV 2014 not received treatment , polysubstance use including IVDU, and alcohol dependence, presenting to the hospital with left leg redness, swelling and pain started before 4 days.  The patient has not had fever or chills.      Last CD4 on 2022 468 23.1%, HIV RNA PCR 2022 35 and HIV Genosure prime 2022 not done HIV RNA less than 200. Repeated HIV RNA PCR 09/14/2023 less than 20. HCV Ab reactive on 10/10, WBCs WNL 5.5 on 10/10, afebrile since admission. Blood culture collected 10/10 has no growth to date.     The patient notes 40 lb weight loss over the past few months and night sweats. He has had reduced appetite, painful swallowing, and difficulty swallowing with improvement with mouth pain after receiving nystatin mouthwash.    Left leg imaging without findings of subcutaneous emphysema or osteomyelitis, started on Vancomycin and cefepime.     Pending lymphocytes subset panel,  HVC RNA quant, quantiferon, BK and CMV PCR.    RECOMMENDATIONS:  Start the patient on his HIV medication Triumeq.  Continue the use of Vancimycin and cefepime.  Please obtain CRP, ESR.  Follow pending labs result.    Will continue to follow.      Discussed and staffed with Dr. Allena Katz. Recommendations as listed above are not finalized until attested by attending physician.    Teyon Odette Ahmed  Infectious Diseases Fellow, PGY-4  [place] [phone]  After Hours Pager ID [phone]    HPI       Alexander [name] is a 37 y.o. male with a  history of HIV diagnosed with HIV on 02/05/17 on Triumeq not been consistently taking it, HCV 2014 not received treatment , polysubstance use including IVDU, and alcohol dependence, presenting to the hospital with left leg redness, swelling and pain started before 4 days. The patient has not had fever or chills.  The patient notes 40 lb weight loss over the past few months and night sweats. He has had reduced appetite, painful swallowing, and difficulty swallowing with improvement with mouth pain after receiving nystatin mouthwash.Imaging without findings of subcutaneous emphysema or osteomyelitis.      Allergies: NKDA.    Antimicrobials:     Current outpatient medications (Only Antimicrobes) as of 09/13/2023       There are no current outpatient antimicrobe medications          Hospital Meds (Antimicrobes Only) as of 09/13/2023            cefepime (MAXIPIME) 2 g in sodium chloride 0.9% 100 mL IVPB ADDaptor    nystatin (MYCOSTATIN) 100,000 unit/mL suspension 500,000 Units    vancomycin (VANCOCIN) 750 mg in sodium chloride 0.9 % 150 mL IVPB                Review of Systems     Negative except as mentioned in HPI.  History     Past medical Hx:   Past Medical History:   Diagnosis Date    Anxiety     Bipolar disorder (CMS-HCC)     Bronchitis     Depression     Drug abuse and dependence (CMS-HCC)     H/O tooth extraction     Hepatitis C     HIV infection (CMS-HCC)     Liver disease Hep C    PTSD (post-traumatic stress disorder)     Substance induced mood disorder (CMS-HCC)     Thyroid disease      Social Hx:   Social History     Socioeconomic History    Marital status: Single     Spouse name: Not on file    Number of children: Not on file    Years of education: Not on file    Highest education level: Not on file   Occupational History    Not on file   Tobacco Use    Smoking status: Every Day     Current packs/day: 1.00     Average packs/day: 1 pack/day for 11.0 years (11.0 ttl pk-yrs)     Types: Cigarettes    Smokeless  tobacco: Never   Substance and Sexual Activity    Alcohol use: Yes     Comment: a case of beer everyday    Drug use: Yes     Types: Heroin, Other-see comments, Marijuana, Cocaine     Comment: fentanyl -- 10/0/24    Sexual activity: Yes     Partners: Female, Male     Birth control/protection: Condom, Other-see comments   Other Topics Concern    Caffeine Use Yes    Occupational Exposure No    Exercise No    Seat Belt Yes   Social History Narrative    Not on file     Social Determinants of Health     Financial Resource Strain: Not on file   Food Insecurity: Unknown (12/26/2022)    Received from [employer] and [employer] [facility]     Worried about running out of food: Not on [place] Bought: Not on file   Transportation Needs: Unknown (12/26/2022)    Received from [facility] and [employer]     Worried about transportation: Not on file   Physical Activity: Not on file   Stress: Not on file   Social Connections: Not on file   Intimate Partner Violence: Unknown (12/26/2022)    Received from [facility] and [employer]     Feel physically or emotionally unsafe where currently live: Not on file     Harm by anyone: Not on file     Emotionally Harmed: Not on file   Housing Stability: Unknown (12/26/2022)    Received from [employer] and [employer]    Housing/Utilities     Worried about losing home: Not on file     Stayed outside house: Not on file     Unable to get utilities: Not on file     Family Hx:   Family History   Problem Relation Age of Onset    Drug abuse Brother     Suicidality Brother     Hypertension Mother     Transient ischemic attack Mother     Seizures Father     Hypertension Father     Cancer Father  Breast Cancer Father     Suicidality Father     Drug abuse Brother     Suicidality Brother      Surgical Hx:   Past Surgical History:   Procedure Laterality Date    MANDIBLE SURGERY      OPEN  REDUCTION INTERNAL FIXATION MANDIBLE Bilateral 01/27/2021    Procedure: OPEN REDUCTION INTERNAL FIXATION MANDIBLE, MAXILLOMANDIBULAR FIXATION;  Surgeon: Ellery Plunk, MD;  Location: [facility] OR;  Service: ENT;  Laterality: Bilateral;       Allergies     No Known Drug Allergies or Adverse Reactions    Medications     Current Facility-Administered Medications   Medication    acetaminophen (TYLENOL) tablet 650 mg    cefepime (MAXIPIME) 2 g in sodium chloride 0.9% 100 mL IVPB ADDaptor    enoxaparin (LOVENOX) syringe 40 mg/0.4 mL    thiamine (B-1) injection 200 mg    And    folic acid (FOLVITE) tablet 1 mg    methocarbamoL (ROBAXIN) tablet 500 mg    nicotine (NICODERM CQ) 21 mg/24 hr 1 patch    And    nicotine (polacrilex) (NICORETTE) gum 4 mg    nystatin (MYCOSTATIN) 100,000 unit/mL suspension 500,000 Units    PHENobarbital (LUMINAL) injection 65 mg    proCHLORPERazine (COMPAZINE) injection 5 mg    vancomycin (VANCOCIN) 750 mg in sodium chloride 0.9 % 150 mL IVPB     Current Outpatient Medications   Medication Sig    albuterol 90 mcg/actuation Inhl inhaler Inhale 1 puff into the lungs every 6 hours as needed.    amLODIPine (NORVASC) 5 MG tablet Take 1 tablet (5 mg total) by mouth daily.    naloxone (NARCAN) 4 mg/actuation Spry Apply 1 spray in one nostril if needed. Call 911. May repeat dose in other nostril if no response in 3 minutes.    TRIUMEQ 600-50-300 mg Tab take one tablet by mouth daily        Vital Signs     Temp:  [98 F (36.7 C)-99.1 F (37.3 C)] 99.1 F (37.3 C)  Heart Rate:  [66-121] 93  Resp:  [11-20] 16  BP: (103-151)/(59-102) 122/91    Intake/Output Summary (Last 24 hours) at 09/14/2023 0946  Last data filed at 09/14/2023 0420  Gross per 24 hour   Intake --   Output 200 ml   Net -200 ml       Physical Exam     General: NAD.  HEENT: No scleral icterus.  NECK: Supple, no LAD.  CV: no murmur, normal S1/S2.  Lung: CTABL, no increased work of breathing.  Abdomen: Soft, no organomegaly, no CVA.  Extremities:  No LEE bilaterally ; DP/PT pulses palpated.  Skin: Warm and dry, no rash on exposed skin, left leg swelled red and painful to touch.  Neuro: Alert and oriented x 3, no obvious focal deficit observed during general exam.  Psych: Appropriate affect and behavior.     Lines, Tubes and Drains     Patient Lines/Drains/Airways Status       Active Epidural Line / PICC Line / PIV Line / ART Line / Line / CVC Line       Name Placement date Placement time Site Days    Peripheral IV 01/24/21 Left;Posterior Forearm 01/24/21  2248  Forearm  962    Peripheral IV 09/14/23 Anterior;Right Hand 09/14/23  0023  Hand  less than 1  Patient Lines/Drains/Airways Status     Active LDAs     Name Placement date Placement time Site Days    Peripheral IV 01/24/21 Left;Posterior Forearm 01/24/21  2248  Forearm    962    Peripheral IV 09/14/23 Anterior;Right Hand 09/14/23  0023  Hand  less   than 1    IUC (Foley) Temperature probe 16 Fr. 01/25/21  0208  Temperature probe    962    Incision 01/27/21 Face fluffs, jawbra 01/27/21  1017  -- 959          Inactive LDAs     None              Laboratory Data     Recent Labs     09/13/23  1814 09/14/23  0507   WBC 9.4 5.5   HGB 12.2* 10.7*   HCT 36.5* 32.9*   PLT 74* 90*      Recent Labs     09/13/23  1814 09/14/23  0507   NA 130* 130*   K 4.0 3.5   CL 96* 98   CO2 27 27   BUN 5* 6*   CREATININE 0.37* 0.49*   GLUCOSE 88 113*   PHOS  --  3.2       Lab Results   Component Value Date    ALKPHOS 136 (H) 09/14/2023    ALT 40 09/14/2023    AST 116 (H) 09/14/2023    BILITOT 1.2 09/14/2023    ALBUMIN 2.6 (L) 09/14/2023    ALBUMIN 2.6 (L) 09/14/2023    BILIDIRECT 0.58 (H) 09/14/2023    PROT 7.3 09/14/2023       No results found for: CKTOTAL, ESR, CRP       Microbiology     Microbiology Results       Date and Time Order Name Sensitivity Status Organisms Specimen ID Source    09/14/2023  4:05 AM #2  Blood culture-Peripheral site 2  Preliminary  [id number] Peripheral    09/14/2023  1:39 AM #1   Blood culture-Peripheral site 1  Preliminary  [id number] Peripheral

## 2023-09-14 NOTE — Progress Notes (Shared)
No chest pain, some pain in his kidney area when moving around;   #cellulitis  - was admitted for cellulitis in 05/2022  - this episode started 4 days ago, red, swollen, painful. Has been climbing up his leg, some mild paraesthesia; tried antibiotic cream on it that did not help; had dog bite on that leg 1.34yr ago  - ed blood cx in process  - vanc, ceftriaxone in ED     #hx hep c, never treated  - labs with hyponatremia, \ast 115/alt46, plt 74  MELD 3.0: 15 at 09/13/2023    - US abdomen    #AUD  Last drink 1:30pm  Does have hx of withdrawal seizure  - 10-12 24oz beer daily    #SUD  - injects fentanyl; last use 24 hrs ago, typicall use 2g in 24hrs; occasional cocaine use  - addiction medicine consult    #Sharp chest pain, not concerning   - cxr wnl    #HIV  appears last office fisit was 03/2021; was on Triumeq on an off but *** has been off ART for over a year, is currently homeless  - ID consult    #hernia  -reducible, off on and painful but goes away, not currently painful      #wt loss  - has lost 40 lb in last year; cough off and on, no night sweats    #HM  - lipid, A1c    #HTN  - not taking any meds consistently    #recent thrush  At [facility] clinic street medicine,   -

## 2023-09-14 NOTE — ED Provider Notes (Signed)
ED Attending Attestation Note    Date of service:  09/13/2023    This patient was seen by the resident physician.  I have seen and examined the patient, agree with the workup, evaluation, management and diagnosis. The care plan has been discussed and I concur.  I have reviewed the ECG and concur with the resident's interpretation.    My assessment reveals a 37 y.o. 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

## 2023-09-14 NOTE — ED Notes (Signed)
Pt back from Ultrasound

## 2023-09-14 NOTE — Research Notes (Signed)
[facility] approached pt due to wound infection.   Pt received a resource packet and risk assessment.    [facility]  [phone]

## 2023-09-15 ENCOUNTER — Inpatient Hospital Stay: Admit: 2023-09-15 | Payer: PRIVATE HEALTH INSURANCE

## 2023-09-15 LAB — FUNGITELL BETA-D-GLUCAN: Fungitell Result: POSITIVE — AB

## 2023-09-15 LAB — VITAMIN D 25 HYDROXY: Vit D, 25-Hydroxy: 21.6 ng/mL — ABNORMAL LOW (ref 30.0–100.0)

## 2023-09-15 LAB — RENAL FUNCTION PANEL W/EGFR
Albumin: 2.7 g/dL — ABNORMAL LOW (ref 3.5–5.7)
Anion Gap: 7 mmol/L (ref 3–16)
BUN: 5 mg/dL — ABNORMAL LOW (ref 7–25)
CO2: 27 mmol/L (ref 21–33)
Calcium: 8.2 mg/dL — ABNORMAL LOW (ref 8.6–10.3)
Chloride: 98 mmol/L (ref 98–110)
Creatinine: 0.42 mg/dL — ABNORMAL LOW (ref 0.60–1.30)
EGFR: >90
Glucose: 114 mg/dL — ABNORMAL HIGH (ref 70–100)
Osmolality, Calculated: 272 mosm/kg — ABNORMAL LOW (ref 278–305)
Phosphorus: 3.3 mg/dL (ref 2.1–4.7)
Potassium: 3.3 mmol/L — ABNORMAL LOW (ref 3.5–5.3)
Sodium: 132 mmol/L — ABNORMAL LOW (ref 133–146)

## 2023-09-15 LAB — IRON STUDIES
% Iron Saturation: 4.7 % — ABNORMAL LOW (ref 15.0–55.0)
Iron: 17 ug/dL — ABNORMAL LOW (ref 50–212)
TIBC: 359 ug/dL (ref 261–462)

## 2023-09-15 LAB — CBC
Hematocrit: 36.7 % — ABNORMAL LOW (ref 38.5–50.0)
Hemoglobin: 12.1 g/dL — ABNORMAL LOW (ref 13.2–17.1)
MCH: 27 pg (ref 27.0–33.0)
MCHC: 33 g/dL (ref 32.0–36.0)
MCV: 82 fL (ref 80.0–100.0)
MPV: 9.3 fL (ref 7.5–11.5)
Platelets: 78 10*3/uL — ABNORMAL LOW (ref 140–400)
RBC: 4.47 10*6/uL (ref 4.20–5.80)
RDW: 16.2 % — ABNORMAL HIGH (ref 11.0–15.0)
WBC: 5.5 10*3/uL (ref 3.8–10.8)

## 2023-09-15 LAB — HEPATIC FUNCTION PANEL
ALT: 44 U/L (ref 7–52)
AST: 114 U/L — ABNORMAL HIGH (ref 13–39)
Albumin: 2.7 g/dL — ABNORMAL LOW (ref 3.5–5.7)
Alkaline Phosphatase: 145 U/L — ABNORMAL HIGH (ref 36–125)
Bilirubin, Direct: 0.74 mg/dL — ABNORMAL HIGH (ref 0.00–0.40)
Bilirubin, Indirect: 0.66 mg/dL (ref 0.00–1.10)
Total Bilirubin: 1.4 mg/dL (ref 0.0–1.5)
Total Protein: 7.6 g/dL (ref 6.4–8.9)

## 2023-09-15 LAB — ASPERGILLUS AG: Aspergillus Ag.: 0.26 {index} (ref 0.00–0.49)

## 2023-09-15 LAB — PROTIME-INR
INR: 1.3 — ABNORMAL HIGH (ref 0.9–1.1)
Protime: 16.6 s — ABNORMAL HIGH (ref 12.1–15.1)

## 2023-09-15 LAB — COPPER, SERUM: Copper: 130 ug/dL (ref 69–132)

## 2023-09-15 LAB — FOLATE: Folic Acid: 9.7 ng/mL (ref 5.90–24.80)

## 2023-09-15 LAB — TREPONEMA PALLIDUM AB WITH REFLEX: Treponema Pallidum: NEGATIVE

## 2023-09-15 LAB — MAGNESIUM: Magnesium: 1.6 mg/dL (ref 1.5–2.5)

## 2023-09-15 LAB — VITAMIN B1, WHOLE BLOOD: Vit. B1, Whole Blood: 182.1 nmol/L (ref 66.5–200.0)

## 2023-09-15 LAB — SED RATE: Sed Rate: 57 mm/h — ABNORMAL HIGH (ref 0–15)

## 2023-09-15 LAB — C-REACTIVE PROTEIN: CRP: 14.4 mg/L — ABNORMAL HIGH (ref 1.0–10.0)

## 2023-09-15 LAB — VANCOMYCIN, TROUGH: Vancomycin Tr: 5.8 ug/mL — ABNORMAL LOW (ref 10.0–20.0)

## 2023-09-15 LAB — VITAMIN B12: Vitamin B-12: 440 pg/mL (ref 180–914)

## 2023-09-15 MED ORDER — potassium chloride (KLOR-CON M20) CR tablet 40 mEq
20 | Freq: Once | ORAL | Status: AC
Start: 2023-09-15 — End: 2023-09-15
  Administered 2023-09-15: 22:00:00 via ORAL

## 2023-09-15 MED ORDER — vancomycin (VANCOCIN) 750 mg in sodium chloride 0.9 % 150 mL IVPB
Freq: Three times a day (TID) | INTRAVENOUS
Start: 2023-09-15 — End: 2023-09-17
  Administered 2023-09-16 – 2023-09-17 (×5): via INTRAVENOUS

## 2023-09-15 MED ORDER — methadone (DOLOPHINE) 10 mg/mL oral concentrate 60 mg
10 | Freq: Every day | ORAL
Start: 2023-09-15 — End: 2023-09-15

## 2023-09-15 MED ORDER — propofol (DIPRIVAN) infusion 10 mg/mL
10 | INTRAVENOUS | PRN
Start: 2023-09-15 — End: 2023-09-15
  Administered 2023-09-15: 14:00:00 via INTRAVENOUS

## 2023-09-15 MED ORDER — loperamide (IMODIUM) capsule 2 mg
2 | Freq: Two times a day (BID) | ORAL | Status: AC | PRN
Start: 2023-09-15 — End: 2023-09-18

## 2023-09-15 MED ORDER — oxyCODONE (ROXICODONE) immediate release tablet 15 mg
15 | ORAL | PRN
Start: 2023-09-15 — End: 2023-09-15

## 2023-09-15 MED ORDER — magnesium sulfate in sterile water 50 mL IVPB 2 g
2 | Freq: Once | INTRAVENOUS | Status: AC
Start: 2023-09-15 — End: 2023-09-15
  Administered 2023-09-15: 16:00:00 via INTRAVENOUS

## 2023-09-15 MED ORDER — sodium chloride 0.9 % IV infusion
INTRAVENOUS | PRN
Start: 2023-09-15 — End: 2023-09-15
  Administered 2023-09-15: 14:00:00 via INTRAVENOUS

## 2023-09-15 MED ORDER — potassium chloride (KLOR-CON M20) CR tablet 40 mEq
20 | Freq: Once | ORAL | Status: AC
Start: 2023-09-15 — End: 2023-09-15
  Administered 2023-09-15: 16:00:00 via ORAL

## 2023-09-15 MED ORDER — methadone (DOLOPHINE) 10 mg/mL oral concentrate 40 mg
10 | Freq: Every day | ORAL | Status: AC
Start: 2023-09-15 — End: 2023-09-18
  Administered 2023-09-16 – 2023-09-18 (×3): via ORAL

## 2023-09-15 MED ORDER — loperamide (IMODIUM) capsule 4 mg
2 | Freq: Once | ORAL | Status: AC
Start: 2023-09-15 — End: 2023-09-15
  Administered 2023-09-15: 07:00:00 via ORAL

## 2023-09-15 MED ORDER — lidocaine (PF) 2% (20 mg/mL) Soln
20 | INTRAMUSCULAR | PRN
Start: 2023-09-15 — End: 2023-09-15
  Administered 2023-09-15: 14:00:00 via INTRAVENOUS

## 2023-09-15 MED ORDER — oxyCODONE (ROXICODONE) immediate release tablet 20 mg
15 | ORAL | PRN
Start: 2023-09-15 — End: 2023-09-16
  Administered 2023-09-16 (×3): via ORAL

## 2023-09-15 MED ORDER — methadone (DOLOPHINE) 10 mg/mL oral concentrate 60 mg
10 | Freq: Every day | ORAL
Start: 2023-09-15 — End: 2023-09-15
  Administered 2023-09-15: 17:00:00 via ORAL

## 2023-09-15 MED ORDER — sodium chloride 0.9 % IV infusion
INTRAVENOUS
Start: 2023-09-15 — End: 2023-09-17

## 2023-09-15 MED ORDER — oxyCODONE (ROXICODONE) immediate release tablet 15 mg
15 | ORAL | PRN
Start: 2023-09-15 — End: 2023-09-15
  Administered 2023-09-16: 01:00:00 via ORAL

## 2023-09-15 MED ORDER — OMNIPAQUE (iohexol) 350 mg iodine/mL 100 mL
350 | Freq: Once | INTRAVENOUS | Status: AC | PRN
Start: 2023-09-15 — End: 2023-09-15
  Administered 2023-09-15: 23:00:00 via INTRAVENOUS

## 2023-09-15 MED ORDER — fluconazole (DIFLUCAN) tablet 100 mg
100 | Freq: Every day | ORAL | Status: AC
Start: 2023-09-15 — End: 2023-09-29
  Administered 2023-09-16 – 2023-09-18 (×3): via ORAL

## 2023-09-15 MED ORDER — propofol 10 mg/ml (DIPRIVAN) injection
10 | INTRAVENOUS | PRN
Start: 2023-09-15 — End: 2023-09-15
  Administered 2023-09-15: 14:00:00 via INTRAVENOUS

## 2023-09-15 MED ORDER — fluconazole (DIFLUCAN) tablet 200 mg
200 | Freq: Once | ORAL | Status: AC
Start: 2023-09-15 — End: 2023-09-15
  Administered 2023-09-15: 22:00:00 via ORAL

## 2023-09-15 MED FILL — TRIUMEQ 600 MG-50 MG-300 MG TABLET: 600-50-300 600-50-300 mg | ORAL | Qty: 1

## 2023-09-15 MED FILL — OXYCODONE 15 MG TABLET: 15 15 MG | ORAL | Qty: 1

## 2023-09-15 MED FILL — CEFEPIME 2 GRAM SOLUTION FOR INJECTION: 2 2 gram | INTRAMUSCULAR | Qty: 1

## 2023-09-15 MED FILL — DIAZEPAM 5 MG TABLET: 5 5 MG | ORAL | Qty: 2

## 2023-09-15 MED FILL — THIAMINE HCL (VITAMIN B1) 100 MG/ML INJECTION SOLUTION: 100 100 mg/mL | INTRAMUSCULAR | Qty: 2

## 2023-09-15 MED FILL — NYSTATIN 100,000 UNIT/ML ORAL SUSPENSION: 100000 100,000 unit/mL | ORAL | Qty: 5

## 2023-09-15 MED FILL — LOPERAMIDE 2 MG CAPSULE: 2 2 mg | ORAL | Qty: 1

## 2023-09-15 MED FILL — METHADONE 10 MG/ML ORAL CONCENTRATE: 10 10 mg/mL | ORAL | Qty: 10

## 2023-09-15 MED FILL — MAGNESIUM SULFATE 2 GRAM/50 ML (4 %) IN WATER INTRAVENOUS PIGGYBACK: 2 2 gram/50 mL (4 %) | INTRAVENOUS | Qty: 50

## 2023-09-15 MED FILL — VANCOMYCIN 1,000 MG INTRAVENOUS INJECTION: 1000 1000 mg | INTRAVENOUS | Qty: 750

## 2023-09-15 MED FILL — METHADONE 10 MG/ML ORAL CONCENTRATE: 10 10 mg/mL | ORAL | Qty: 1

## 2023-09-15 MED FILL — FLUCONAZOLE 200 MG TABLET: 200 200 MG | ORAL | Qty: 1

## 2023-09-15 MED FILL — LORAZEPAM 1 MG TABLET: 1 1 MG | ORAL | Qty: 2

## 2023-09-15 MED FILL — NICOTINE (POLACRILEX) 4 MG GUM: 4 4 MG | BUCCAL | Qty: 1

## 2023-09-15 MED FILL — ENOXAPARIN 40 MG/0.4 ML SUBCUTANEOUS SYRINGE: 40 40 mg/0.4 mL | SUBCUTANEOUS | Qty: 0.4

## 2023-09-15 MED FILL — POTASSIUM CHLORIDE ER 20 MEQ TABLET,EXTENDED RELEASE(PART/CRYST): 20 20 MEQ | ORAL | Qty: 2

## 2023-09-15 MED FILL — FOLIC ACID 1 MG TABLET: 1 1 MG | ORAL | Qty: 1

## 2023-09-15 MED FILL — GABAPENTIN 300 MG CAPSULE: 300 300 MG | ORAL | Qty: 1

## 2023-09-15 MED FILL — NICOTINE 21 MG/24 HR DAILY TRANSDERMAL PATCH: 21 21 mg/24 hr | TRANSDERMAL | Qty: 1

## 2023-09-15 MED FILL — OMNIPAQUE 350 MG IODINE/ML INTRAVENOUS SOLUTION: 350 350 mg iodine/mL | INTRAVENOUS | Qty: 100

## 2023-09-15 MED FILL — LOPERAMIDE 2 MG CAPSULE: 2 2 mg | ORAL | Qty: 2

## 2023-09-15 MED FILL — FLUCONAZOLE 100 MG TABLET: 100 100 MG | ORAL | Qty: 1

## 2023-09-15 MED FILL — PANTOPRAZOLE 40 MG INTRAVENOUS SOLUTION: 40 40 mg | INTRAVENOUS | Qty: 1

## 2023-09-15 MED FILL — METHADONE 10 MG/ML ORAL CONCENTRATE: 10 10 mg/mL | ORAL | Qty: 4

## 2023-09-15 MED FILL — METHOCARBAMOL 500 MG TABLET: 500 500 MG | ORAL | Qty: 1

## 2023-09-15 NOTE — Progress Notes (Signed)
Nursing Overnight Progress Note    Significant Events During Shift  Admitted overnight from ER,COWS 10, pt c/o having frequent loose stool in ER (none observed yet), GI upset, tremors and aching, HTN [id number], Green Team notified of findings. C/O 10/10 pain, and erythema to Leg, PRN oxycodone given along with scheduled antibiotics. CIWA 12, PRN ativan 2 mg given per protocol.    Patient/Family Concerns  Evening/Overnight Visitors: none  Concerns: none    Assessment  Nursing time demands: high  IV access: has IV access, adequate and functioning  Sitter requirements:  no    Mental Status  No data found.    Calls to Physician Team  No data found.    Medications  [phone] - Medications Not Given  (last 12 hrs)           ** SITE UNKNOWN **         Medication Name Action Time Action Reason Comments     nystatin (MYCOSTATIN) 100,000 unit/mL suspension 500,000 Units 09/14/23 1717 Not Given Patient/family refused                     Diet/Meals Consumed (past 24 hours)  No data found.

## 2023-09-15 NOTE — Anesthesia Pre-Procedure Evaluation (Addendum)
[facility]  DEPARTMENT OF ANESTHESIOLOGY  PRE-PROCEDURAL EVALUATION    Alexander [name] is a 37 y.o. year old male presenting for:    Procedure(s):  EGD    Surgeon:   Royetta Asal, MD    Chief Complaint     HIV disease (CMS-HCC); Cellulitis of left lower extremity; HIV infection, *    Review of Systems     Anesthesia Evaluation    Patient summary reviewed, nursing notes reviewed and Previous anesthetic and pre-operative note copied, reviewed and updated as necessary.       I have reviewed the History and Physical Exam, any relevant changes are noted in the anesthesia pre-operative evaluation.      Cardiovascular:        Hypertension is poorly controlled.      (-) past MI.    Neuro/Muscoloskeletal/Psych:    (+) neuromuscular disease, back problem, anxiety, bipolar disorder and depression.  Seizures (seizures years ago from alcohol withdrawal).       Pulmonary:      COPD.        (-) asthma, recent URI.   ROS comment: Every day smoker      GI/Hepatic/Renal:    (+) liver disease.    Hepatitis C.    (-) GERD, renal disease.    Comments:   Odynophagia and hematemesis    Endo/Other:    (+) HIV.      (-) no bleeding disorder, no clotting disorder.     Comments:   ETOH dependence  H/o drug abuse (Marijuana, heroin, cocaine)         Past Medical History     Past Medical History:   Diagnosis Date    Anxiety     Bipolar disorder (CMS-HCC)     Bronchitis     Depression     Drug abuse and dependence (CMS-HCC)     H/O tooth extraction     Hepatitis C     HIV infection (CMS-HCC)     Liver disease Hep C    PTSD (post-traumatic stress disorder)     Substance induced mood disorder (CMS-HCC)     Thyroid disease        Past Surgical History     Past Surgical History:   Procedure Laterality Date    MANDIBLE SURGERY      OPEN REDUCTION INTERNAL FIXATION MANDIBLE Bilateral 01/27/2021    Procedure: OPEN REDUCTION INTERNAL FIXATION MANDIBLE, MAXILLOMANDIBULAR FIXATION;  Surgeon: Ellery Plunk, MD;  Location: [facility] OR;  Service: ENT;   Laterality: Bilateral;       Family History     Family History   Problem Relation Age of Onset    Drug abuse Brother     Suicidality Brother     Hypertension Mother     Transient ischemic attack Mother     Seizures Father     Hypertension Father     Cancer Father     Breast Cancer Father     Suicidality Father     Drug abuse Brother     Suicidality Brother        Social History     Social History     Socioeconomic History    Marital status: Single     Spouse name: Not on file    Number of children: Not on file    Years of education: Not on file    Highest education level: Not on file   Occupational History    Not on file  Tobacco Use    Smoking status: Every Day     Current packs/day: 1.00     Average packs/day: 1 pack/day for 11.0 years (11.0 ttl pk-yrs)     Types: Cigarettes    Smokeless tobacco: Never   Substance and Sexual Activity    Alcohol use: Yes     Comment: a case of beer everyday    Drug use: Yes     Types: Heroin, Other-see comments, Marijuana, Cocaine     Comment: fentanyl -- 10/0/24    Sexual activity: Yes     Partners: Female, Male     Birth control/protection: Condom, Other-see comments   Other Topics Concern    Caffeine Use Yes    Occupational Exposure No    Exercise No    Seat Belt Yes     Social Determinants of Health     Financial Resource Strain: Not on file   Food Insecurity: Food Insecurity Present (09/15/2023)    Hunger Vital Sign     Worried About Programme researcher, broadcasting/film/video in the Last Year: Often true     Ran Out of Food in the Last Year: Often true   Transportation Needs: Unmet Transportation Needs (09/15/2023)    PRAPARE - Therapist, art (Medical): Yes     Lack of Transportation (Non-Medical): Yes   Physical Activity: Not on file   Stress: Not on file   Social Connections: Not on file   Intimate Partner Violence: Not At Risk (09/15/2023)    Humiliation, Afraid, Rape, and Kick questionnaire     Fear of Current or Ex-Partner: No      Emotionally Abused: No     Physically Abused: No     Sexually Abused: No       Medications     Allergies:  No Known Drug Allergies or Adverse Reactions    Home Meds:  Prior to Admission medications as of 09/15/23 0851   Medication Sig Taking?   abacavir-dolutegravir-lamivud (TRIUMEQ) 600-50-300 mg Tab Take 1 tablet by mouth daily. Yes   albuterol 90 mcg/actuation Inhl inhaler Inhale 1 puff into the lungs every 6 hours as needed.    amLODIPine (NORVASC) 5 MG tablet Take 1 tablet (5 mg total) by mouth daily.    naloxone (NARCAN) 4 mg/actuation Spry Apply 1 spray in one nostril if needed. Call 911. May repeat dose in other nostril if no response in 3 minutes.        Inpatient Meds:  Scheduled:    abacavir-dolutegravir-lamivud  1 tablet Oral Daily 0900    ceFEPime (MAXIPIME) IV extended infusion  2 g Intravenous Q8H    diazePAM  10 mg Oral TID    enoxaparin  40 mg Subcutaneous Daily 0900    thiamine (vitamin B1) IV orderable  200 mg Intravenous 3 times per day    And    folic acid  1 mg Oral Daily 0900    gabapentin  300 mg Oral TID    magnesium sulfate in sterile water 50 mL  2 g Intravenous Once    methadone  40 mg Oral Daily 0900    methocarbamoL  500 mg Oral QID    nicotine  1 patch Transdermal Daily 0900    nystatin  500,000 Units Swish & Swallow PC/HS    pantoprazole (PROTONIX) IV  40 mg Intravenous BID6    potassium chloride  40 mEq Oral Once    Followed by    potassium chloride  40 mEq Oral Once    vancomycin  15 mg/kg Intravenous Q12H     Continuous:     PRN: acetaminophen, loperamide, LORazepam **OR** LORazepam, nicotine **AND** nicotine polacrilex, oxyCODONE, proCHLORPERazine    Vital Signs     Wt Readings from Last 3 Encounters:   09/15/23 117 lb (53.1 kg)   03/09/21 150 lb (68 kg)   01/27/21 140 lb (63.5 kg)     Ht Readings from Last 3 Encounters:   09/15/23 5' 9 (1.753 m)   01/27/21 5' 9 (1.753 m)   10/03/20 5' 9 (1.753 m)     Temp Readings from Last 3 Encounters:   09/15/23 98.3 F  (36.8 C) (Oral)   03/09/21 97.7 F (36.5 C) (Temporal)   01/27/21 97.6 F (36.4 C) (Axillary)     BP Readings from Last 3 Encounters:   09/15/23 (!) 167/92   03/09/21 128/81   01/27/21 (!) 143/91     Pulse Readings from Last 3 Encounters:   09/15/23 78   03/09/21 71   01/27/21 95     SpO2 Readings from Last 3 Encounters:   09/15/23 99%   03/09/21 98%   01/27/21 99%       Physical Exam     Airway:     Mallampati: II  TM distance: > = 3 FB  Neck ROM: full  (+) facial hair     Dental:        Comment: Poor dentition multiple missing teeth      Pulmonary:      Breathing: unlabored         Cardiovascular:     Rhythm: regular  Rate: normal    Neuro/Musculoskeletal/Psych:     Mental status: alert and oriented to person, place and time.          Abdominal:       Current OB Status:       Other Findings:        Laboratory Data     Lab Results   Component Value Date    WBC 5.5 09/15/2023    HGB 12.1 (L) 09/15/2023    HCT 36.7 (L) 09/15/2023    MCV 82.0 09/15/2023    PLT 78 (L) 09/15/2023       No results found for: [facility]    Lab Results   Component Value Date    GLUCOSE 114 (H) 09/15/2023    BUN 5 (L) 09/15/2023    CO2 27 09/15/2023    CREATININE 0.42 (L) 09/15/2023    K 3.3 (L) 09/15/2023    NA 132 (L) 09/15/2023    CL 98 09/15/2023    CALCIUM 8.2 (L) 09/15/2023    ALBUMIN 2.7 (L) 09/15/2023    ALBUMIN 2.7 (L) 09/15/2023    PROT 7.6 09/15/2023    ALKPHOS 145 (H) 09/15/2023    ALT 44 09/15/2023    AST 114 (H) 09/15/2023    BILITOT 1.4 09/15/2023       Lab Results   Component Value Date    INR 1.3 (H) 09/15/2023       No results found for: PREGTESTUR, PREGSERUM, HCG, HCGQUANT    Anesthesia Plan     ASA 3             Anesthesia Type:  MAC.      PONV Risk Factors:  plan for postoperative opioid use.              Induction:   Intravenous induction.  Anesthetic plan and risks discussed with patient.    Plan, alternatives, and risks of anesthesia, including death, have been explained to and discussed with the  patient/legal guardian.  By my assessment, the patient/legal guardian understands and agrees.  Scenario presented in detail.  Questions answered.    Blood products not discussed.      Plan discussed with CRNA.

## 2023-09-15 NOTE — Anesthesia Post-Procedure Evaluation (Signed)
Anesthesia Post Note    Patient: Alexander [name]    Procedure(s) Performed: Procedure(s):  EGD WITH BIOPSY    Anesthesia type: MAC    Patient location: Endoscopy PACU    Airway: Patent    Post pain: Adequate analgesia    Nausea / Vomiting: Absent    Post-operative Hydration Status: Adequate    Post assessment: no apparent anesthetic complications, tolerated procedure well, and no evidence of recall    Last Vitals:   Vitals:    09/15/23 1110 09/15/23 1146 09/15/23 1221 09/15/23 1237   BP: (!) 155/95 (!) 171/103  (!) 161/96   BP Location:  Left upper arm  Left upper arm   Patient Position:  Lying  Lying   BP Cuff Size:  Regular  Regular   Pulse:  93 106    Resp:  24     Temp:  97.9 F (36.6 C)     TempSrc:  Oral     SpO2: 100% 100%     Weight:       Height:            Last Temperature: 97.9 F (36.6 C) (09/15/2023 11:46 AM)      Post vital signs: stable    Level of consciousness: awake, alert , and oriented    Complications:  There were no known notable events for this encounter.

## 2023-09-15 NOTE — Plan of Care (Signed)
Problem: Patient will remain free of injury d/t fall  Goal: High Fall Risk Precautions  Outcome: Progressing  Note: .  Intervention: Universal Fall Precautions  Description: Address personal needs (5 P's)     Call light within reach  Environment free of clutter      Overbed table within reach  Bed lowest position, wheels locked  Non-skid socks on patient  Use assistive devices and/or strategies  Note: .  Intervention: Place a Yellow Fall Risk bracelet on the patient's arm  Note: .  Intervention: Place a Yellow Fall Risk sign outside the patient's door  Note: .  Intervention: Utilize gait belt for all transfers  Note: .  Intervention: Activate bed alarm/chair alarm to the call light interface (n/a at [facility])  Note: .  Goal: Additional High Fall Risk Precautions (consider the following)  Note: .  Intervention: Move patient closer to nurses' station if possible  Note: .  Intervention: Educate family members to notify care providers when leaving the patient room  Note: .  Intervention: Evaluate need for a Patient Monitor  Note: .  Intervention: Yellow socks if available  Note: .  Intervention: Consider clinical pharmacy review of medications  Note: .

## 2023-09-15 NOTE — Care Coordination-Inpatient (Signed)
SUD SW attempted to meet with pt at bedside. Pt declined talking with addictions at this time due to being tired from his recent procedure. Addictions will follow up.

## 2023-09-15 NOTE — Other (Signed)
[facility]  PRE-SEDATION ASSESSMENT, HISTORY & PHYSICAL    Date: 09/15/2023     Alexander [name] is a 37 y.o. year old male MRN: [id number]    Pre-Procedure Diagnosis/Procedure Indication: odynophagia and hematemesis  Planned Procedure: EGD  NPO for solids 6-8 hours, NPO for liquids 6-8 hours    Past Medical History     Past Medical History:   Diagnosis Date    Anxiety     Bipolar disorder (CMS-HCC)     Bronchitis     Depression     Drug abuse and dependence (CMS-HCC)     H/O tooth extraction     Hepatitis C     HIV infection (CMS-HCC)     Liver disease Hep C    PTSD (post-traumatic stress disorder)     Substance induced mood disorder (CMS-HCC)     Thyroid disease      Difficult intubation Unanswered       Patient Active Problem List   Diagnosis    Foreign body in cornea    Other, mixed, or unspecified nondependent drug abuse, unspecified (CMS-HCC)    Drug-induced mood disorder (CMS-HCC)    Bipolar affective disorder, depressed (CMS-HCC)    Severe bipolar I disorder, current or most recent episode depressed (CMS-HCC)    Mood disorder (CMS-HCC)    Bipolar I disorder, most recent episode depressed (CMS-HCC)    Tylenol overdose    Bipolar disorder (CMS-HCC)    Alcohol abuse    Polysubstance abuse (CMS-HCC)    Persistent mood disorder (CMS-HCC)    HIV infection    Chronic hepatitis C without hepatic coma (CMS-HCC)    Suicidal ideation    Elevated liver enzymes    Peripheral polyneuropathy    Hematospermia    Alcohol withdrawal (CMS-HCC)    Cellulitis    Opioid use disorder    Cough with hemoptysis    Transaminitis    Cellulitis of left lower extremity    Angular cheilitis    Odynophagia       Past Surgical History     Past Surgical History:   Procedure Laterality Date    MANDIBLE SURGERY      OPEN REDUCTION INTERNAL FIXATION MANDIBLE Bilateral 01/27/2021    Procedure: OPEN REDUCTION INTERNAL FIXATION MANDIBLE, MAXILLOMANDIBULAR FIXATION;  Surgeon: Ellery Plunk, MD;  Location: [facility] OR;  Service: ENT;  Laterality: Bilateral;        Medications     Prior to Admission medications    Medication Sig Start Date End Date Taking? Authorizing Provider   abacavir-dolutegravir-lamivud (TRIUMEQ) 600-50-300 mg Tab Take 1 tablet by mouth daily. 09/14/23  Yes Amado Nash, MD   albuterol 90 mcg/actuation Inhl inhaler Inhale 1 puff into the lungs every 6 hours as needed. 07/09/23   Historical Provider, MD   amLODIPine (NORVASC) 5 MG tablet Take 1 tablet (5 mg total) by mouth daily. 07/09/23   Historical Provider, MD   naloxone Jonelle Sports) 4 mg/actuation Spry Apply 1 spray in one nostril if needed. Call 911. May repeat dose in other nostril if no response in 3 minutes. 08/07/20   Charise Killian, MD     Allergies:  No Known Allergies    Abbreviated Review of Systems (ROS)     Functional Capacity: [facility] ACTIVITY SCALE: 3 - Walking on a flat surface for one or two blocks.  Chest Pain: no  Shortness of Breath/Dyspnea or Exertion: no  Recent URI: no    Airway, ASA Score & Sedation Specific History Concerns  Mallampati:  II  Mouth Opening:  > 4 cm  Facial Hair: No  Short Neck: No    ASA Score: III - Moderate systematic disease with functional limitations  Sedation-Specific History Concerns: None    This patient was re-evaluated immediately prior to sedation administration.  This procedure is being performed on an emergent basis.         Focused Physical Exam:     Height ; Weight ; BMI Body mass index is 17.28 kg/m.  Vitals:    09/15/23 0901   BP: (!) 162/110   Pulse: 90   Resp: 18   Temp: 97.2 F (36.2 C)   SpO2: 100%       Neuro: alert  Cardiovascular: RRR  Respiratory: equal chest rise    Sedation Plan: mac    Antibiotic prophylaxis is not indicated.

## 2023-09-15 NOTE — Op Note (Signed)
[id number]  _______________________________________________________________________________  Procedure Date: 09/15/2023 9:44 AM    Patient Name: Alexander [name]  MRN: [id number]                         Account Number: [id number]  Date of Birth: [birth year]              Admit Type: Inpatient  Age: 37                               Gender: Male  Note Status: Finalized                Attending MD: Royetta Asal , MD, [id number]  _______________________________________________________________________________     Procedure:             Upper GI endoscopy  Indications:           Odynophagia  Patient Profile:       37 year old male with polysubstance use and HIV                          presenting for EGD to evaluate hematemesis and                          odynophagia.  Providers:             Royetta Asal, MD, Rene Paci, MD (Fellow)  Referring MD:            Medicines:             Monitored Anesthesia Care  Complications:         No immediate complications.  _______________________________________________________________________________  Procedure:             Pre-Anesthesia Assessment:                         - Prior to the procedure, a History and Physical was                          performed, and patient medications and allergies were                          reviewed. The patient is competent. The risks and                          benefits of the procedure and the sedation options and                          risks were discussed with the patient. All questions                          were answered and informed consent was obtained.                          Patient identification and proposed procedure were                          verified by the physician, the nurse, the anesthetist  and the technician in the endoscopy suite. Mental                          Status Examination: alert and oriented. Airway                          Examination: normal oropharyngeal airway and neck                           mobility. Respiratory Examination: clear to                          auscultation. CV Examination: normal. Prophylactic                          Antibiotics: The patient does not require prophylactic                          antibiotics. Prior Anticoagulants: The patient has                          taken no anticoagulant or antiplatelet agents. ASA                          Grade Assessment: III - A patient with severe systemic                          disease. After reviewing the risks and benefits, the                          patient was deemed in satisfactory condition to                          undergo the procedure. The anesthesia plan was to use                          monitored anesthesia care (MAC). Immediately prior to                          administration of medications, the patient was                          re-assessed for adequacy to receive sedatives. The                          heart rate, respiratory rate, oxygen saturations,                          blood pressure, adequacy of pulmonary ventilation, and                          response to care were monitored throughout the                          procedure. The physical status of the patient was  re-assessed after the procedure.                         After obtaining informed consent, the endoscope was                          passed under direct vision. Throughout the procedure,                          the patient's blood pressure, pulse, and oxygen                          saturations were monitored continuously. The endoscope                          was introduced through the mouth, and advanced to the                          second part of duodenum. The upper GI endoscopy was                          accomplished without difficulty. The patient tolerated                          the procedure well.                                                                                   Findings:        Diffuse, white plaques were found in the proximal esophagus and in the        mid esophagus. Cells for cytology were obtained by brushing.       Moderately severe esophagitis was found in the proximal and mid        esophagus. Suspicious for infectious esophagitis. Biopsies were taken        with a cold forceps for histology.       Diffuse erythematous mucosa was found in the entire examined stomach.        Biopsies were taken with a cold forceps for Helicobacter pylori testing.       The cardia and gastric fundus were normal on retroflexion.       Many non-bleeding superficial duodenal ulcers with no stigmata of        bleeding were found in the duodenal bulb. Biopsies were taken with a        cold forceps for histology.       Normal mucosa was found in the second portion of the duodenum.  Estimated Blood Loss:       Estimated blood loss was minimal.  Impression:            - Esophageal plaques were found, suspicious for                          candidiasis. Cells for cytology obtained.                         - Moderately severe esophagitis. Biopsied.                         - Erythematous mucosa in the stomach. Biopsied.                         - Non-bleeding duodenal ulcers with no stigmata of                          bleeding. Biopsied.                         - Normal mucosa was found in the second portion of the                          duodenum.  Recommendation:        - Return patient to hospital ward for ongoing care.                         - Resume previous diet.                         - Continue present medications.                         - Await pathology results.                                                                                   Procedure Code(s):     --- Professional ---                         [id number], GC, Esophagogastroduodenoscopy, flexible,                          transoral; with biopsy, single or  multiple  Diagnosis Code(s):     --- Professional ---                         K22.9, Disease of esophagus, unspecified                         K20.90, Esophagitis, unspecified without bleeding                         K31.89, Other diseases of stomach and duodenum  K26.9, Duodenal ulcer, unspecified as acute or                          chronic, without hemorrhage or perforation                         R13.10, Dysphagia, unspecified    CPT copyright 2022 American Medical Association. All rights reserved.    The codes documented in this report are preliminary and upon coder review may   be revised to meet current compliance requirements.  Attending Participation:       I was present and participated during the entire procedure, including        non-key portions.                                                                                     Royetta Asal, MD  _______________  Royetta Asal, MD  09/15/2023 10:37:32 AM  This report has been signed electronically.Royetta Asal , MD    N.Stephan Minister, MD  _______________________  Rene Paci, MD  09/15/2023 10:26:59 AM  Total Procedure Duration Time 0 hours 12 minutes 34 seconds   Scope In: 10:08:13 AM  Scope Out: 10:20:47 AM         [name] [place], [place], [address]

## 2023-09-15 NOTE — Care Coordination-Inpatient (Signed)
[place]  Case Management/Social Work Department  Progress Note    Patient Information     Patient Name: Alexander [name]  MRN: [id number]  Hospital day: 1  Inpatient/Observation:  Inpatient   Level of Care:  medicine   Admit date:  09/13/2023  Admission diagnosis: HIV disease (CMS-HCC) [B20]  Cellulitis of left lower extremity [L03.116]  HIV infection, unspecified symptom status (CMS-HCC) [Z21]    PMH:  has a past medical history of Anxiety, Bipolar disorder (CMS-HCC), Bronchitis, Depression, Drug abuse and dependence (CMS-HCC), H/O tooth extraction, Hepatitis C, HIV infection (CMS-HCC), Liver disease (Hep C), PTSD (post-traumatic stress disorder), Substance induced mood disorder (CMS-HCC), and Thyroid disease.    PCP:  Herbert Pun, MD    Home Pharmacy:    [facility] [id number] - [address], [place] - [address] AT [address]  [address]  [place] [address]  Phone: [phone]     [facility]  [address] [place] [address]  Phone: [phone]     [facility] [facility] [place] [place] - [address]  [address]  [place] [address]  Phone: [phone]     [facility] [id number] [place], [place] - [address] AT [facility] [address]  [address]  [place] [address]  Phone: [phone]     [facility]  [address]  [address] [place] [address]  Phone: [phone]     [facility] [id number] [place], [place] - [address]  [address] [employer] [place] [address]  Phone: [phone]         Medical Insurance Coverage:  Payor: Luther Redo / Plan: MOLINA / Product Type: Medicaid [place] care /     Other Pertinent Information     SW attended daily medical huddle and per team, patient is not medically ready to discharge.   Patient was not in room when SW CM tried to complete routine psychosocial assessment, will follow up as time allows.     Discharge Plan     Anticipated discharge plan:  TBD     Anticipated discharge date:  TBD    CM/SW will  continue to follow and remain available for discharge planning needs.      Hardie Pulley, MSW, [place]     [phone]

## 2023-09-15 NOTE — Progress Notes (Signed)
[facility]: Vancomycin Monitoring Consult       Alexander [name] is a 37 y.o. male currently being treated for cellulitis.    Patient has no known drug allergies or adverse reactions.    Pharmacy consulted for vancomycin management by [facility].      Current Anti-Infectives         Dose Frequency Start End    abacavir-dolutegravir-lamivud (TRIUMEQ) 600-50-300 mg tablet 1 tablet 1 tablet Daily 09/15/2023 --    Admin Instructions:  Administer with or without food.  Administer 2 hours before or 6 hours after cation-containing antacids or laxatives, sucralfate, oral supplements containing iron or calcium, or buffered medications.  Tablet may be crushed for enteral administration.    Route: Oral    cefepime (MAXIPIME) 2 g in sodium chloride 0.9% 100 mL IVPB ADDaptor 2 g Every 8 hours 09/14/2023 --    Admin Instructions: **DOUBLE CHECK LENGTH OF INFUSION BEFORE ADMINISTRATION**  Use ADDaptor product - Mix Thoroughly Before Administration    Route: Intravenous    fluconazole (DIFLUCAN) tablet 100 mg 100 mg Daily 09/16/2023 09/29/2023    Admin Instructions: DO NOT CRUSH OR CHEW.    Route: Oral    Linked Group 1: Placed in Followed by Linked Group        fluconazole (DIFLUCAN) tablet 200 mg 200 mg Once 09/15/2023 09/16/2023    Admin Instructions: DO NOT CRUSH OR CHEW.    Route: Oral    Linked Group 1: Placed in Followed by Linked Group        nystatin (MYCOSTATIN) 100,000 unit/mL suspension 500,000 Units 500,000 Units 30 min after meals and bedtime 09/14/2023 --    Route: Swish & Swallow    vancomycin (VANCOCIN) 750 mg in sodium chloride 0.9 % 150 mL IVPB 15 mg/kg  53.1 kg Every 8 hours 09/15/2023 --    Admin Instructions: Contact pharmacy if there is a question/concern of whether vancomycin should be given based on serum drug levels.    Route: Intravenous          documented within (last 72 hours)       Date/Time Action Medication Dose Rate    09/15/23 1446 New Bag    vancomycin  (VANCOCIN) 750 mg in sodium chloride 0.9 % 150 mL IVPB 750 mg 150 mL/hr    09/15/23 0238 New Bag    vancomycin (VANCOCIN) 750 mg in sodium chloride 0.9 % 150 mL IVPB 750 mg 150 mL/hr    09/14/23 1357 New Bag    vancomycin (VANCOCIN) 750 mg in sodium chloride 0.9 % 150 mL IVPB 750 mg 150 mL/hr    09/14/23 0157 New Bag    vancomycin (VANCOCIN) 1,250 mg in sodium chloride 0.9% 250 mL ADDaptor IVPB 1,250 mg 200 mL/hr            --Objective Data--     Vitals:    09/15/23 1110 09/15/23 1146 09/15/23 1221 09/15/23 1237   BP: (!) 155/95 (!) 171/103  (!) 161/96   Pulse:  93 106    Resp:  24     Temp:  97.9 F (36.6 C)     TempSrc:  Oral     SpO2: 100% 100%     Weight:       Height:           I/O last 3 completed shifts:  In: -   Out: 1100 [Urine:1100]    WBC, BUN, Creatinine  (Last 7 days)  Today 0626 Yesterday 0507 10/09 1814    WBC             5.5                       5.5                       9.4              BUN             5                       6                       5               Creatinine             0.42                       0.49                       0.37                      Ideal body weight: 70.7 kg (155 lb 13.8 oz)    --Cultures--     Microbiology Results       Date and Time Order Name Sensitivity Status Organisms Specimen ID Source    09/14/2023  4:05 AM #2  Blood culture-Peripheral site 2  Preliminary  [id number] Peripheral    09/14/2023  1:39 AM #1  Blood culture-Peripheral site 1  Preliminary  [id number] Peripheral            --Vancomycin Concentrations--     Lab Results  (Last 7 days)        Today 1424    Vanc Tr             5.8                       --Assessment and Plan--        Patient is a 37 y.o. male being treated with vancomycin for cellulitis.    Patient is currently receiving vancomycin 750mg  IV q12h.  Vancomycin trough was sub-therapeutic at 5.8. Will increase vancomycin to 750mg  IV q8h.   Will check vancomycin serum concentration prior to 4th dose of current regimen.  Goal vancomycin  trough of 10-15 mg/L.  Pharmacy will continue to monitor therapy for efficacy and toxicity.       Thank you for the consult.    A. Amyre Segundo, PharmD, BCPS, CTTS  Clinical Pharmacy Specialist, Neurology / Internal Medicine  Preferred contact: Epic Secure Chat  Phone: [phone]

## 2023-09-15 NOTE — Progress Notes (Signed)
Department of Internal Medicine  Daily Progress Note      Chief Complaint / Reason for Follow-Up     Alexander [name] is a 37 y.o. male on hospital day 1. The principal reason for today's follow up visit is Cellulitis of left lower extremity.    Interval History / Subjective     NAEON. Patient was made NPO for EGD this morning.    Patient reported having loose stools overnight. The night team reported they administer 4 mg Imodium to assist with this and he reported that it didn't seem to help much. Patient stated he still was having loose stools in the morning. Reported continued nausea and vomiting, and frequent yawning. Patient reported nasal congestion but denied rhinorrhea.    Patient endorses persistent, though slightly improved, left lower leg and foot pain. Reports stable to worsening lower back pain. Patient reported continued mouth pain and odynophagia.    Patient reported episode of mild chest pain as well in the middle of the night that resolved with administration of the pain medication given to treat his lower extremity pain.       Review of Systems (Focused)     Denied fever, chills, shortness of breath, and abdominal pain.      Medications     Scheduled Meds:   abacavir-dolutegravir-lamivud  1 tablet Oral Daily 0900    ceFEPime (MAXIPIME) IV extended infusion  2 g Intravenous Q8H    diazePAM  10 mg Oral TID    enoxaparin  40 mg Subcutaneous Daily 0900    thiamine (vitamin B1) IV orderable  200 mg Intravenous 3 times per day    And    folic acid  1 mg Oral Daily 0900    gabapentin  300 mg Oral TID    magnesium sulfate in sterile water 50 mL  2 g Intravenous Once    methadone  60 mg Oral Daily 0900    methocarbamoL  500 mg Oral QID    nicotine  1 patch Transdermal Daily 0900    nystatin  500,000 Units Swish & Swallow PC/HS    pantoprazole (PROTONIX) IV  40 mg Intravenous BID6    potassium chloride  40 mEq Oral Once    vancomycin  15 mg/kg Intravenous Q12H     Continuous Infusions:   sodium chloride 0.9 %        PRN Meds:  acetaminophen, loperamide, LORazepam **OR** LORazepam, nicotine **AND** nicotine polacrilex, oxyCODONE, proCHLORPERazine       Vital Signs     Temp:  [97.2 F (36.2 C)-98.3 F (36.8 C)] 97.9 F (36.6 C)  Heart Rate:  [78-106] 106  Resp:  [16-24] 24  BP: (148-176)/(84-110) 161/96    Intake/Output Summary (Last 24 hours) at 09/15/2023 1345  Last data filed at 09/15/2023 1100  Gross per 24 hour   Intake 300 ml   Output 1200 ml   Net -900 ml         Physical Exam     Physical Exam  Vitals reviewed.   Constitutional:       Appearance: He is ill-appearing. He is not diaphoretic.   HENT:      Head: Normocephalic.      Nose: Nose normal. No rhinorrhea.      Mouth/Throat:      Comments: Diffuse white patches; Angular cheilitis - corners of mouth are cracked and bloody  Eyes:      General:         Right eye:  No discharge.         Left eye: No discharge.      Extraocular Movements: Extraocular movements intact.      Comments: Bilateral miosis minimally responsive to light   Cardiovascular:      Rate and Rhythm: Normal rate and regular rhythm.      Pulses: Normal pulses.      Heart sounds: Normal heart sounds. No murmur heard.     No friction rub. No gallop.   Pulmonary:      Effort: Pulmonary effort is normal. No respiratory distress.      Breath sounds: Normal breath sounds. No stridor. No wheezing, rhonchi or rales.   Abdominal:      General: Abdomen is flat. Bowel sounds are normal. There is no distension.      Palpations: Abdomen is soft.      Tenderness: There is no abdominal tenderness. There is no guarding or rebound.      Comments: splenomegaly   Musculoskeletal:         General: Swelling and tenderness present. Normal range of motion.      Cervical back: Normal range of motion.      Comments: LLE with significantly less erythema and edema, also now localized closer to mid-shin down to toes having regressed from patella upon ED presentation    LLE also less tender to palpation today compared to  yesterday    midline lower back pain tender to palpation    dark brown hyperpigmented and scarred plaque on lower R medial shin above medial malleolus   Skin:     General: Skin is warm and dry.      Capillary Refill: Capillary refill takes less than 2 seconds.      Findings: Erythema present.      Comments: LLE erythema improved from yesterday but still present   Neurological:      General: No focal deficit present.      Mental Status: He is alert and oriented to person, place, and time.   Psychiatric:         Mood and Affect: Mood normal.           Laboratory Data       CBC 09/15/2023                 \    12.1   /         5.5  \______/ 78                 /            \                             /   36.7    \ Differential 09/13/2023    N 79.6 L 9.8 M 9.7 E 0.3 B 0.6      Renal 09/15/2023       132      98        5    /    ___ _____________/ 114                                     \     3.3        27      0.42   \  Ca  8.2 (09/15/2023)  Mg     1.6 (09/15/2023)  Phos 3.3 (09/15/2023) Lipids    Lab Results   Component Value Date    CHOLTOT 51 09/14/2023    TRIG 58 09/14/2023    HDL 10 (L) 09/14/2023    LDL 29 09/14/2023      LFTs 09/15/2023               \      1.4      /              \    0.74    /         114  \______/  44               /            \              /    145      \ PT/INR/PTT - 09/15/2023    PT  16.6 INR  1.3 PTT  50.3                  Lab 09/14/23  0436   COLOR, URINE Yellow   CLARITY Cloudy*   SPECIFIC GRAVITY, URINE 1.024   PH UA 6.0   PROTEIN UA 30*   GLUCOSE UA Negative   KETONES UA Negative   BILIRUBIN UA Negative   BLOOD UA Negative   NITRITE UA Negative   UROBILINOGEN UA 12.0*   LEUKOCYTES UA Negative   RBC UA 1   WBC UA 2   BACTERIA Occasional*   MUCUS Present*             No results found for: NTPROBNP    Lab Results   Component Value Date    TSH 1.21 10/17/2017    FREET4 1.08 11/14/2013             Diagnostic Studies     I have reviewed today's labs. Please see below for  interpretation in a problem-based format.    Imaging:  09/15/23 - MRI Lumbar Spine W WO Contrast - Pending    09/15/23 - CT Calf-Tibia Fibula Left w/ IV Contrast - Pending    09/15/23 - EGD  Impression:            - Esophageal plaques were found, suspicious for                          candidiasis. Cells for cytology obtained.                         - Moderately severe esophagitis. Biopsied.                         - Erythematous mucosa in the stomach. Biopsied.                         - Non-bleeding duodenal ulcers with no stigmata of                          bleeding. Biopsied.                         - Normal mucosa was found in the second portion of the  duodenum.  Recommendation:        - Return patient to hospital ward for ongoing care.                         - Resume previous diet.                         - Continue present medications.                         - Await pathology results.    09/14/23 - VASC Venous Duplex Lower Extremity Ltd - Left  Conclusions: Normal LLE venous duplex exam. Enlarged lymph nodes left groin.     09/14/23 - US Abdomen Complete   IMPRESSION:     Cholelithiasis without CT evidence of acute cholecystitis.     Trace ascites.     Splenomegaly.     09/13/23 - XR L Tibia-Fibula  FINDINGS:     There is no evidence of fracture, malalignment, or other acute abnormality. There is extensive diffuse soft tissue swelling. No radiopaque foreign bodies are identified.    Impression   IMPRESSION:    No acute osseous abnormality       09/13/23 - XR L Foot  FINDINGS:     There is no evidence of fracture, malalignment, or other acute abnormality. There is extensive diffuse soft tissue swelling. No radiopaque foreign bodies are identified.    Impression   IMPRESSION:    No acute osseous abnormality       09/13/23 - CXR  IMPRESSION:   No acute cardiopulmonary abnormality.     Microbiological Data:  09/14/23 - Blood cultures x2 - NGTD    Abx:  IV Vancomycin and Cefepime -  10/9-TBD  PO Fluconazole - 10/11-10/25  Nystatin mouthwash      Assessment & Plan     RIJUL AMMAR is a 37 y.o. male on HD# 1 with a history of recently untreated HIV, HCV, AUD, TUD, and OUD who presented to the hospital with 4 days of left lower leg swelling highly suspicious for Cellulitis of left lower extremity.  The medical issues being addressed in today's encounter are as follows:    Principal Problem:    Cellulitis of left lower extremity  Active Problems:    Polysubstance abuse (CMS-HCC)    HIV infection    Chronic hepatitis C without hepatic coma (CMS-HCC)    Alcohol withdrawal (CMS-HCC)    Angular cheilitis    Odynophagia      #LLE Cellulitis  On arrival to ED on 10/9, L leg found to be circumferentially edematous, erythematous, and tender to palpation up to the patient's L knee. On the L medial shin, a large dark brown/black hyperpigmented scarred plaque is noted which the patient reports was from a dog bite approximately 1.5-2 years ago. Patient was afebrile without chills but tachycardic and hypertensive upon arrival to ED, and blood cultures were drawn with antibiotics vancomycin and cefepime started. The patient is also unhoused. Patient's exam concerning for LLE cellulitis especially in the setting of chronic immunosuppression, previous dog bite, nonadherence to HIV treatment, and a lack of sanitary living conditions. Patient reported injected fentanyl into arm but not into legs. Left leg imaging without findings of subcutaneous emphysema or osteomyelitis. Left leg ultrasound was performed which showed no thrombus present. After administration of vancomycin and cefepime antibiotics, the erythema, pain, and edema markedly and  rapidly improved. Patient afebrile with WBC count within normal limits at 9.4 upon presentation and 5.5 on first day after initiation of antibiotics but also in the setting of HIV infection and updated lymphocyte subset lab pending. ESR and CRP elevated at 57 and 14.4,  respectively. Both blood cultures with no growth to date. Pain control also being managed. Patient has remained afebrile and hemodynamically stable since stabilization with antibiotics and pain management in the ED.  - Infectious disease consulted primarily for HCV and recently untreated HIV but also recommended CT calf-tibia fibula Left w/ IV contrast to evaluate for underlying myositis or abscess   - follow-up CT scan results  - Continue IV vancomycin and cefepime  - no need to add on flagyl for anaerobe coverage due to rapid and positive response to vancomycin and cefepime alone  - consider switching to PO pending CT findings  - Vancomycin trough pending  - Pain control  - Tylenol 650 mg PO q8h PRN for mild and moderate pain  - Oxycodone 15 mg PO q3h PRN with possibility to increase dose to 20 mg and 5-10 mg from there slowly per day if needed per Addiction Medicine's recommendation  - Follow-up blood cultures   - 10/10 - NGTD  - Monitor LLE daily for progression or resolution of cellulitis and adjust abx regimen as needed    #Odynophagia  #Poor PO intake  #Vitamin D deficiency  Patient also reported pain with and difficulty eating and swallowing in the setting of recently untreated HIV over the past year. Also reported mouth pain, decreased appetite, and pain/burning sensation in throat, as well as white patches on the inside of his mouth confirmed on physical exam. Reported that he has been taking mouthwash, presumably nystatin, for these symptoms from the [facility] clinic and that it results in mild improvement. Nutrition labs ordered to check nutrition status. Vitamin B9 and B12 within normal limits. Vitamin D (21.6) and Albumin (2.7) low.  - GI c/s and performed EGD today significant for esophageal plaques suspicious for candidiasis, moderately severe esophagitis, erythematous stomach mucosa, and non-bleeding duodenal ulcers with no stigmata of bleeding. Biopsies were obtained and final reads are pending. GI  recommended resuming normal diet and continuing present medications.   - started 2-week fluconazole course on 10/11 for Candidal esophagitis  - Obtain regular ECG's for fluconazole's Qtc prolonging effect monitoring while also taking methadone   - ECG in ED on 10/9 showed Qtc of 444  - IV PPI BID per GI - Pantoprazole 40 mg IV BID  - Transfuse to maintain Hgb > 7  - Nutrition c/s in the setting of decreased PO intake associated with odynophagia; follow-up recs  - Vitamin D and Albumin low   - replete vitamin D PRN   - regular diet  - Nystatin mouthwash 500kU 30 min after meals and QHS    #HIV  Patient w/ HIV diagnosis on 02/05/17 on Triumeq. Has not been taking it consistently over the past year or so. Reported taking his medicine last about 2 months ago. It appears he has not seen an ID specialist since 2022. Per ID note, Last CD4 on 2022 468 23.1%, HIV RNA PCR 2022 35 and HIV Genosure prime 2022 not done HIV RNA less than 200. Repeated HIV RNA PCR 09/14/2023 less than 20. 10/10 Blood culture still with no reported growth to date. Patient reports recent history of night sweats and 40-pound weight loss over the past few months. Also reports decreased appetite, odynophagia, and  difficulty swallowing with some improvement in mouth pain after receiving nystatin mouthwash from [facility]. As of 10/11, patient has remained afebrile and WBC count is within normal limits at 5.5. Pending updated lymphocyte subset lab.  - ID c/s; follow-up and appreciate recs. 10/10 recs include:  - Start the patient on his HIV medication Triumeq.  - Continue the use of Vancimycin and Cefepime.  - Please obtain CRP, ESR.  - Follow pending labs result.  - Restarted Triumeq 600-50-300 mg tablet PO daily 10/11 at 0900 hrs  - follow-up infectious labs   - T. Pallidum negative   - Crypto Ag negative   - Mycobacteria/TB negative   - EBV IgG positive, IgM negative   - BK negative   - CMV negative   - Fungitell pending   - Aspergillus Ag  pending  - Hepatitis labs  - Hepatitis A non-reactive; administer Hep A vaccine when appropriate  - Hep B non-immune  - HCV positive (see more details below)    #Back pain  Patient reported back pain and leg pain on day of admission and yesterday. Patient reported back pain is worse today than yesterday despite leg pain slightly improving. Patient appears to be significantly distressed by the pain. Exam demonstrates midline lumbar spine mild tenderness to palpation. History of IVDU. Patient also reported recent history of night sweats and 40-pound weight loss over the past few months. Both blood cultures with no growth to date as of 10/10. Patient afebrile with WBC count within normal limits at 9.4 upon presentation and 5.5 on first day after initiation of antibiotics but also in the setting of HIV infection and updated lymphocyte subset lab pending. ESR and CRP elevated at 57 and 14.4, respectively. Differential diagnosis includes but not limited to spinal epidural abscess, spinal osteomyelitis, Pott's disease, vertebral compression fracture, and lumbosacral sprain.  - MRI lumbar spine w/ w/o contrast pending  - Continue pain control as per above  - additional pain control with methocarbamol 500 mg PO QID    #Alcohol Use Disorder  #Opioid Use Disorder  #Tobacco Use Disorder  #Alcohol withdrawal  #Opioid withdrawal  Patient reported he is unhoused and usually consumes 1 case of beer per day and 2 g of fentanyl per day which he injects into his arm but not his leg. Also uses cocaine intermittently and smokes 1.5 ppd. Patient has shown signs of substance withdrawal in the form of nausea, diarrhea, mild hypertension into the 150s-160s, and yawning. Hepatic function panel significant for AST/ALT ratio >2 on ED presentation and repeated panels.  - 10/10 UDS positive for barbiturates (likely bc placed on phenobarbital upon medicine admission by the night team), cocaine, fentanyl, and oxycodone  - Treat diarrhea  symptomatically with Imodium 2 mg PO BID PRN  - Treat nausea with Prochlorperazine (Compazine) inj 5 mg IV q6h PRN  - Addiction Medicine consulted and made recs for simultaneous alcohol and opioid withdrawal management. Treatment plan now includes:  - Scheduled Methadone 40 mg PO at 0900 hrs, 10 mg PO at 1300 hrs, and 10 mg PO at 1800 hrs daily  - Patient required scheduled methadone 40 mg and two 10 mg PRN doses yesterday on 10/10, so with Addiction Medicine's guidance and supervision, will trial this new schedule  - Increased complexity managing methadone because of various drug-drug interactions that simultaneously increase (phenobarbital - discontinued) and decrease (fluconazole) the metabolism of methadone  - Methadone 10 mg PO q4h PRN for COWS > 8  - Switch Phenobarbitol  to diazepam 10 mg PO TID benzodiazepine due to phenobarbital's induction of methadone metabolism, lowering methadone's efficacy  - Pending downtrending CIWA scores tomorrow, consider removing discontinuing Valium  - Ativan 2 mg PO PRN for CIWA-Ar 11-16  - Ativan 4 mg PO PRN for CIWA-Ar >16  - NRT 21 mcg patch and 4 mg lozenge  - IN narcan upon discharge  - IV thiamine 200 mg q8h x3 days, followed by 100 mg IV daily x4 days (decreased PO absorption for individuals w/AUD)   - PO folic acid daily  - Gabapentin 300 mg PO TID  - Obtain regular ECG's for methadone Qtc prolonging effect monitoring while also taking fluconazole  - ECG in ED on 10/9 showed Qtc of 444  - Thiamine B1 lab pending, follow-up   - Follow-up Behavioral Health's/Addiction Medicine's recommendations    #Diarrhea  #Hypokalemia  Diarrhea the day after arrival to the ED after not having used his usual substances alcohol and fentanyl. Low concern for infectious process or C. Diff because the diarrhea per the patient began yesterday when the patient could not gain access to illicit substances, afebrile, WBC count at patient's baseline, not enough time for C. Diff infection to  develop post-antibiotic treatment, stable abdominal exam with mild tenderness to palpation in the right upper quadrant in the setting of HCV and trace ascites, and denies abdominal pain at rest. 10/11 labs demonstrated mild hypokalemia (3.3), likely due to diarrhea associated with opioid withdrawal.  - Imodium 2 mg PO BID PRN  - Replete K+ PO PRN  - CTM patient with daily renal panels, patient interviews, and physical exams    #Hepatitis C  #Thrombocytopenia  #Splenomegaly  Patient HCV diagnosis as early as 2014. Has not received treatment per ID. HCV Ab reactive on 10/10. ID team consulted. Appreciate and follow-up recs. 10/9 hepatic function panel significant for elevated Alkaline Phosphatase (146), elevated AST (115), normal ALT (46), low albumin (3.2), and normal T. Protein, Indirect Bili, and T. Bili levels, with elevated D. Bili level at 0.8. Similar hepatic function panel levels redemonstrated on subsequent hepatic functional panels. 10/10 US Abdomen significant for trace ascites, splenomegaly, and cholelithiasis without evidence of acute cholecystitis. Platelet count low since ED presentation at 74, then 90 and 78. Suspect thrombocytopenia is likely due to combination of malnutrition and splenomegaly. HCV RNA Quant pending. Serum copper lab pending. 10/10 ID recs for HCV include:  - Outpatient follow-up for treatment  - Immune to hep B, needs hepatitis A immunization  - follow-up labs: HCV RNA Quant, serum copper    #Mild normocytic anemia  Hgb level upon ED presentation on 10/9 at 12.2. Repeat levels on 10/10 and 10/11 at 10.7 and 12.1. MCV within normal limits. May be due to malnutrition. Vitamin B12 and folic acid levels within normal limits. Iron and % Fe saturation levels decreased to 17 and 4.7, respectively. Vital signs stable with largely normal HR, RR, BP, and SpO2. Hgb levels prior to this encounter show baseline >13 on average.  - Ferrous sulfate PO daily   - CTM with daily CBC    #Chest  pain  10/11 morning, patient reported episode of mild chest pain over night that resolved after administration of pain medication for leg pain. Troponin labs (8 and 4), BNP (121), ECG, and CXR on ED presentation non-concerning for ACS or other clinically concerning cardiopulmonary pathology. Renal panel today and yesterday reassuring against significant likelihood of any arrhythmogenic or pericardial etiology. Also, resolution with medication and in  the setting of diffuse discomfort associated with opioid and alcohol withdrawal and infection decreases clinical suspicious for acute or urgent pathology.  - CTM daily with vital signs, renal panel, CBC, patient interview and physical exam    DVT ppx: Enoxaparin 40 mg subcutaneous daily   F: Replete PRN  E: Replete PRN  Nutrition:  Diet/Nutrition Orders    Diet Regular(7)     Frequency: Effective Now     Number of Occurrences: Until Specified     Order Questions:      Suicide/Behavior Risk Modification? No       Code Status: Full Code    Signed:  Blair Heys, MD  09/15/2023, 1:45 PM

## 2023-09-15 NOTE — Consults (Signed)
[facility]  Medical Nutrition Therapy    Reason(s) for Completion: Physician/Nursing Referral    Diet Order/Nutrition Support: Regular (7)    Chief Complaint: Cellulitis of left lower extremity     Pertinent Information: Alexander [name] is a 37 y.o. male with a history of HIV, HCV, polysubstance use including IVDU, and alcohol dependence, presenting to the hospital with left leg redness and swelling. UDS 09/14/23: presumptive barbiturates, cocaine, fentanyl, and oxycodone. S/p EGD today     Nutrition consult received for Vomiting/Diarrhea/Nausea Greater Than 3 Days. No recorded PO intake as of yet. Pt with 2 year gap between weights, 33 lbs lost in this time with BMI of 17.28 kg/m. Pt with known homelessness PTA, SW and Care Management following. Thiamine ordered. Pt seen today, not very responsive to questions during visit. Lying flat in bed. Meal tray untouched. Will continue to monitor.     LOS: 1 day  Patient Active Problem List   Diagnosis    Foreign body in cornea    Other, mixed, or unspecified nondependent drug abuse, unspecified (CMS-HCC)    Drug-induced mood disorder (CMS-HCC)    Bipolar affective disorder, depressed (CMS-HCC)    Severe bipolar I disorder, current or most recent episode depressed (CMS-HCC)    Mood disorder (CMS-HCC)    Bipolar I disorder, most recent episode depressed (CMS-HCC)    Tylenol overdose    Bipolar disorder (CMS-HCC)    Alcohol abuse    Polysubstance abuse (CMS-HCC)    Persistent mood disorder (CMS-HCC)    HIV infection    Chronic hepatitis C without hepatic coma (CMS-HCC)    Suicidal ideation    Elevated liver enzymes    Peripheral polyneuropathy    Hematospermia    Alcohol withdrawal (CMS-HCC)    Cellulitis    Opioid use disorder    Cough with hemoptysis    Transaminitis    Cellulitis of left lower extremity    Angular cheilitis    Odynophagia     Past Medical History:   Diagnosis Date    Anxiety     Bipolar disorder (CMS-HCC)     Bronchitis      Depression     Drug abuse and dependence (CMS-HCC)     H/O tooth extraction     Hepatitis C     HIV infection (CMS-HCC)     Liver disease Hep C    PTSD (post-traumatic stress disorder)     Substance induced mood disorder (CMS-HCC)     Thyroid disease        Scheduled Meds:    abacavir-dolutegravir-lamivud  1 tablet Oral Daily 0900    ceFEPime (MAXIPIME) IV extended infusion  2 g Intravenous Q8H    diazePAM  10 mg Oral TID    enoxaparin  40 mg Subcutaneous Daily 0900    fluconazole  200 mg Oral Once    Followed by    [START ON 09/16/2023] fluconazole  100 mg Oral Daily 0900    thiamine (vitamin B1) IV orderable  200 mg Intravenous 3 times per day    And    folic acid  1 mg Oral Daily 0900    gabapentin  300 mg Oral TID    methadone  60 mg Oral Daily 0900    methocarbamoL  500 mg Oral QID    nicotine  1 patch Transdermal Daily 0900    nystatin  500,000 Units Swish & Swallow PC/HS    pantoprazole (PROTONIX) IV  40 mg  Intravenous BID6    potassium chloride  40 mEq Oral Once    vancomycin  15 mg/kg Intravenous Q12H      Continuous Infusions:   sodium chloride 0.9 %        PRN Meds:acetaminophen, loperamide, LORazepam **OR** LORazepam, nicotine **AND** nicotine polacrilex, oxyCODONE, proCHLORPERazine     Pertinent Labs:   Lab Results   Component Value Date    CREATININE 0.42 (L) 09/15/2023    BUN 5 (L) 09/15/2023    NA 132 (L) 09/15/2023    K 3.3 (L) 09/15/2023    CL 98 09/15/2023    CO2 27 09/15/2023     Lab Results   Component Value Date    ALBUMIN 2.7 (L) 09/15/2023    ALBUMIN 2.7 (L) 09/15/2023     Lab Results   Component Value Date    CALCIUM 8.2 (L) 09/15/2023    PHOS 3.3 09/15/2023     Lab Results   Component Value Date    MG 1.6 09/15/2023     Lab Results   Component Value Date    HGBA1C 4.9 09/14/2023     Lab Results   Component Value Date    CRP 14.4 (H) 09/15/2023     Skin Integrity: Abrasion - LLE  Edema: +3 pitting, LLE    Potential Nutrition Related Factor(s):  Skin Integrity and Appetite Change  Social  needs that may impact access to food:   Within the past 12 months, you worried that your food would run out before you got the money to buy more. Often true   Within the past 12 months, the food you bought just didn't last and you didn't have money to get more. Often true   ----> Working with SW and Care Management  Food Allergies/Intolerances: NKFA  Cultural Requests: None noted     37 y.o.   Male   Ht Readings from Last 1 Encounters:   09/15/23 5' 9 (1.753 m)     Wt Readings from Last 1 Encounters:   09/15/23 117 lb (53.1 kg)        Body mass index is 17.28 kg/m.   Usual Weight: See below  Ideal Body Weight: 160 lb (73 kg) +/- 10%  Other: 73% IBW  Weight History:   Wt Readings from Last 15 Encounters:   09/15/23 117 lb (53.1 kg)   03/09/21 150 lb (68 kg)   01/27/21 140 lb (63.5 kg)   10/03/20 140 lb (63.5 kg)   08/07/20 135 lb 3.2 oz (61.3 kg)   04/12/20 151 lb 12.8 oz (68.9 kg)   04/01/20 150 lb (68 kg)   02/10/20 151 lb 9.6 oz (68.8 kg)   02/01/20 151 lb 9.6 oz (68.8 kg)   02/27/18 170 lb (77.1 kg)   10/17/17 158 lb (71.7 kg)   08/11/17 149 lb (67.6 kg)   07/24/17 150 lb (68 kg)   05/16/17 148 lb (67.1 kg)   04/11/17 146 lb (66.2 kg)     NFPE: Pt with 2 year gap between weights, 33 lbs lost in this time     Estimated Nutrition Needs:   Needs based On: CBW (53 kg)  Kcals/day: 1590-1855 (30-35 kcal/kg)  Protein g/day: 64-80 (1.2-1.5 g/kg)  Carbohydrate g/day: No hx of DM  Fluid ml/day: ~1 ml/kcal or per MD    Nutrition Related Problems:   Nutrition Diagnosis: Increased nutrient needs (pro, kcal)  Related To: HIV, IVDU, alcohol dependence  As Evidenced By: Increased metabolic demand  Recommended Interventions: Monitor PO Intake/Tolerance  Goals:Total energy intake improved as evidenced by PO intake at least 75% of meals/supplements/snacks within 3-4 days    Discharge Planning and Education: Discharge plan currently pending clinical course. RD to remain available for coordination of care.     Follow up per  nutrition services protocol while inpatient.    Recommendation(s) to Physician:   Monitor electrolytes, replete prn  Monitor PO intake, weight, nutrition-related labs, and POC    Area RD to provide follow up and make further recommendations/adjustments to nutrition care plan as needed.    Clovis Fredrickson, RD, LD  Clinical Dietitian   Contact via Epic messenger

## 2023-09-15 NOTE — Progress Notes (Signed)
The following clinical information related to substance use is CONFIDENTIAL and protected by [employer]. ACCESS TO THIS INFORMATION IS ON A NEED-TO-KNOW BASIS ONLY AND IS PROVIDED FOR THE PURPOSE OF ASSURING APPROPRIATE MEDICAL CARE. Federal regulations (42 CFR, Part 2) prohibit the release of this information without specific written consent of the patient. A general authorization for the release of medical information is NOT sufficient for the purpose of releasing the following information.]          ADDICTION CONSULT, FOLLOW-UP PROGRESS NOTE     [name] [id number]     Date/time of admission: 09/13/2023 11:46 PM Length of Stay: 1  Time spent in patient care (including face to face time, Chart review, Documentation and care coordination) : 45 minutes    Subjective:    Per consult note:  Fentanyl: injects 2 grams a day  Stimulants: cocaine intermittent  Alcohol: over 1 case per day of beer  Tobacco: 1.5 PPD  Benzo: no regular use    Today, the patient reports that his pain is about the same as previous. Rated 8/10. Located in lower back going up to liver area and in his left leg.  He feels that the methadone yesterday was somewhat helpful in managing his symptoms, but today, he is reporting ongoing withdrawal symptoms including insomnia, headache, sweating, shaking, anxiety, and irritability.    Currently on:  - Valium 10mg  tid  - Ativan 2mg  prn CIWA 11-16  - Ativan 4mg  prn CIWA>16  - Methadone 40mg  daily  - Methadone 10mg  q4h prn COWS>8  - Oxycodone 15mg  q4h  - Nicotine 21mg /24hr 1 patch daily  - Nicorette gum 4mg  q1h prn    Received:  Methadone 40mg  10/10 1333, 10mg  2300  Phenobarbital 65mg  10/10 1107, 650mg  0416    OBJECTIVE:  Vitals:  Marland Kitchen  Vitals:    09/15/23 1040 09/15/23 1050 09/15/23 1100 09/15/23 1110   BP: (!) 176/107 (!) 169/108 148/84 (!) 155/95   BP Location:       Patient Position:       BP Cuff Size:       Pulse:       Resp:       Temp: 97.4 F (36.3 C)      TempSrc: Axillary      SpO2: 100%  100% 100% 100%   Weight:       Height:         General Appearance: pleasant, engaged, NAD  HEENT: clear sclera, EOMI, clear speech  Thorax: Normal respiratory effort, talks in full sentences  Abdomen: Non-distended  Extremitites: No edema noted  Musculoskeletal: Moves all extremities spontaneously  Skin: No jaundice, warm, intact  Psych: linear thoughts, pleasant  Neuro: Alert    - T pallidum 09/15/23: negative  - UDS 09/14/23: presumptive barbiturates, cocaine, fentanyl, and oxycodone    Assessment:  #Severe alcohol use disorder  #Severe opioid use disorder  #Opioid withdrawal  #Nicotine dependence  #Cocaine use    RECS:  -continue methadone 60mg  daily (max of 60mg  TDD today)  -as patient seemed somewhat sleepy on exam (though reawakened easily and could converse),  - tomorrow 10/12, recommend methadone 40mg -10mg -10mg  (hold PM doses if sedated or RR< 8., ,max of 60mg  TDD)  -continue oxycodone 15mg  Q4H PRN for COWS > 8, acute pain, or opioid cravings  -continue NRT patch and 4mg  lozenge  -continue fixed and symptom triggered benzos due to hx of complicated withdrawal  -over weekend, as clinical improvement, can taper benzos; monitor  for over-sedation through the weekend  -IV thiamine 200mg  Q8H x3 days, followed by 100mg  IV QD x4 days (decreased PO absorption for individuals w/AUD)  -continue PO folic acid daily  -IN narcan on discharge        THESE RECOMMENDATIONS ARE PRELIMINARY UNTIL SIGNED BY THE ATTENDING    Discharge Plan:       Do not hesitate to reach out Addiction medicine consult team, Please use Q-Genda for up to date call schedule and appropriate physician to contact,     Len Blalock, MD MHS  Addiction Medicine Internal Medicine  Behavioral and Addiction Sciences Department   [facility]  Pager: [phone]    Melody Vanessa Kick, PGY-1  Family Medicine/Psychiatry Resident         I have personally seen and evaluated the patient face-to-face and I performed key elements of the  history and exam. Patient discussed in rounds including the treatment plan and course of action. I agree with the documented history, exam, and treatment plan stated ,  I formulated the plan with the resident and with the treatment team, agree with the resident's  documentation of Alexander [name]    Agree with plan to keep methadone dose stable, due to MMT interactions with phenobarb and fluconazole it can make the dosing challenging to balance withdrawal and risk of sedation. Sleepy this PM easy to awaken most likely due to sedation wearing off/recent receipt of benzos. As a precaution agree tomorrow with methadone split dosing tomorrow and can hold PM doses if patient is edated    Use full agonist opioids such as oxycodone liberally to manage breakthrough symptoms

## 2023-09-15 NOTE — TOC Discharge Planning (AHS/AVS) (Signed)
Anesthesia Transfer of Care Note    Patient: PACE POITIER  Procedure(s) Performed: Procedure(s):  EGD WITH BIOPSY    Patient location: Endoscopy PACU    Anesthesia type: MAC    Airway Device on Arrival to PACU/ICU: Room Air    IV Access: Peripheral    Monitors Recommended to be Used During PACU/ICU: Standard Monitors    Outstanding Issues to Address: None    Level of Consciousness: awake    Post vital signs:    Vitals:    09/15/23 0901   BP: (!) 162/109   Pulse: 90   Resp: 18   Temp: 97.2 F (36.2 C)   SpO2: 100%       Complications:  No notable events documented.    Date 09/14/23 0700 - 09/15/23 0659 09/15/23 0700 - 09/16/23 0659   Shift 0700-1459 1500-2259 2300-0659 24 Hour Total 0700-1459 1500-2259 2300-0659 24 Hour Total   INTAKE   I.V.(mL/kg)     300(5.7)   300(5.7)     Volume (mL) (sodium chloride 0.9 % IV infusion)     300   300   Shift Total(mL/kg)     300(5.7)   300(5.7)   OUTPUT   Urine(mL/kg/hr)   900(2.1) 900(0.7)         Urine   900 900       Stool             Stool Occurrence 3 x 1 x  4 x       Shift Total(mL/kg)   900(17) 900(17)       Weight (kg) 53.1 53.1 53.1 53.1 53.1 53.1 53.1 53.1

## 2023-09-15 NOTE — ED Notes (Signed)
Ready and clean bed assigned to [id number]. Pt updated on plan of care including transfer and is agreeable. Receiving RN may call [phone] to consult ED RN with questions regarding patients care. Pt is leaving the department in stable condition with all personal items in possession.   Ordered medications that have been received from Pharmacy will not be tubed to receiving unit.  The patient does not have a patient monitor at bedside in the ED.  Most recent vitals: BP (!) 166/94   Pulse 98   Temp 99.1 F (37.3 C) (Oral)   Resp 22   Wt 117 lb (53.1 kg)   SpO2 100%   BMI 17.28 kg/m     Oswaldo Milian, RN

## 2023-09-16 ENCOUNTER — Inpatient Hospital Stay: Admit: 2023-09-16 | Payer: PRIVATE HEALTH INSURANCE

## 2023-09-16 LAB — HEPATIC FUNCTION PANEL
ALT: 42 U/L (ref 7–52)
AST: 111 U/L — ABNORMAL HIGH (ref 13–39)
Albumin: 2.6 g/dL — ABNORMAL LOW (ref 3.5–5.7)
Alkaline Phosphatase: 139 U/L — ABNORMAL HIGH (ref 36–125)
Bilirubin, Direct: 0.57 mg/dL — ABNORMAL HIGH (ref 0.00–0.40)
Bilirubin, Indirect: 0.63 mg/dL (ref 0.00–1.10)
Total Bilirubin: 1.2 mg/dL (ref 0.0–1.5)
Total Protein: 7.4 g/dL (ref 6.4–8.9)

## 2023-09-16 LAB — PROTIME-INR
INR: 1.3 — ABNORMAL HIGH (ref 0.9–1.1)
Protime: 16.7 s — ABNORMAL HIGH (ref 12.1–15.1)

## 2023-09-16 LAB — RENAL FUNCTION PANEL W/EGFR
Albumin: 2.6 g/dL — ABNORMAL LOW (ref 3.5–5.7)
Anion Gap: 7 mmol/L (ref 3–16)
BUN: 5 mg/dL — ABNORMAL LOW (ref 7–25)
CO2: 23 mmol/L (ref 21–33)
Calcium: 8.3 mg/dL — ABNORMAL LOW (ref 8.6–10.3)
Chloride: 97 mmol/L — ABNORMAL LOW (ref 98–110)
Creatinine: 0.48 mg/dL — ABNORMAL LOW (ref 0.60–1.30)
EGFR: >90
Glucose: 90 mg/dL (ref 70–100)
Osmolality, Calculated: 261 mosm/kg — ABNORMAL LOW (ref 278–305)
Phosphorus: 3.6 mg/dL (ref 2.1–4.7)
Potassium: 4.7 mmol/L (ref 3.5–5.3)
Sodium: 127 mmol/L — ABNORMAL LOW (ref 133–146)

## 2023-09-16 LAB — CBC
Hematocrit: 37.5 % — ABNORMAL LOW (ref 38.5–50.0)
Hemoglobin: 12.4 g/dL — ABNORMAL LOW (ref 13.2–17.1)
MCH: 27 pg (ref 27.0–33.0)
MCHC: 33 g/dL (ref 32.0–36.0)
MCV: 81.9 fL (ref 80.0–100.0)
MPV: 9.5 fL (ref 7.5–11.5)
Platelets: 74 10*3/uL — ABNORMAL LOW (ref 140–400)
RBC: 4.58 10*6/uL (ref 4.20–5.80)
RDW: 15.9 % — ABNORMAL HIGH (ref 11.0–15.0)
WBC: 5.8 10*3/uL (ref 3.8–10.8)

## 2023-09-16 LAB — LYMPHOCYTE SUBSET FOLLOWUP
% CD 3 Pos. Lymph.: 87.6 % (ref 56.0–89.0)
% CD 4 Pos. Lymph.: 45.9 % (ref 30.0–64.0)
% CD 8 Pos. Lymph.: 38.6 % (ref 6.0–43.0)
Absolute CD 3: 587 {cells}/uL — ABNORMAL LOW (ref 870.0–2532.0)
Absolute CD 4 Helper: 295 {cells}/uL (ref 255.0–2496.0)
Absolute CD 8 (Supp): 249 {cells}/uL (ref 155.0–1010.0)
CD19 % B Cell: 4.8 % (ref 1.0–28.0)
CD19 Abs: 34 {cells}/uL (ref 24.0–683.0)
CD4/CD8 Ratio: 1.19 ratio (ref 1.10–3.70)

## 2023-09-16 LAB — MAGNESIUM: Magnesium: 1.7 mg/dL (ref 1.5–2.5)

## 2023-09-16 MED ORDER — gadobutrol (GADAVIST) 1 mmol/1 mL IV syringe 5.5 mL
1 | Freq: Once | INTRAVENOUS | Status: AC | PRN
Start: 2023-09-16 — End: 2023-09-16
  Administered 2023-09-16: 06:00:00 via INTRAVENOUS

## 2023-09-16 MED ORDER — electrolyte-R (pH 7.4) (NORMOSOL-R pH 7.4) IV bolus 1,000 mL
Freq: Once | INTRAVENOUS | Status: AC
Start: 2023-09-16 — End: 2023-09-16
  Administered 2023-09-16: 17:00:00 via INTRAVENOUS

## 2023-09-16 MED ORDER — oxyCODONE (ROXICODONE) immediate release tablet 15 mg
15 | ORAL | PRN
Start: 2023-09-16 — End: 2023-09-18
  Administered 2023-09-18: 02:00:00 via ORAL

## 2023-09-16 MED ORDER — lidocaine (LIDODERM) 5 % 1 patch
5 | TOPICAL
Start: 2023-09-16 — End: 2023-09-18
  Administered 2023-09-16 – 2023-09-18 (×3): via TRANSDERMAL

## 2023-09-16 MED ORDER — diazePAM (VALIUM) tablet 5 mg
5 | Freq: Three times a day (TID) | ORAL
Start: 2023-09-16 — End: 2023-09-17
  Administered 2023-09-17 (×2): via ORAL

## 2023-09-16 MED FILL — CEFEPIME 2 GRAM SOLUTION FOR INJECTION: 2 2 gram | INTRAMUSCULAR | Qty: 1

## 2023-09-16 MED FILL — ENOXAPARIN 40 MG/0.4 ML SUBCUTANEOUS SYRINGE: 40 40 mg/0.4 mL | SUBCUTANEOUS | Qty: 0.4

## 2023-09-16 MED FILL — FLUCONAZOLE 100 MG TABLET: 100 100 MG | ORAL | Qty: 1

## 2023-09-16 MED FILL — DIAZEPAM 5 MG TABLET: 5 5 MG | ORAL | Qty: 1

## 2023-09-16 MED FILL — OXYCODONE 15 MG TABLET: 15 15 MG | ORAL | Qty: 1

## 2023-09-16 MED FILL — GADAVIST 7.5 MMOL/7.5 ML (1 MMOL/ML) INTRAVENOUS SYRINGE: 7.5 7.5 mmol/7.5 mL (1 mmol/mL) | INTRAVENOUS | Qty: 7.5

## 2023-09-16 MED FILL — GABAPENTIN 300 MG CAPSULE: 300 300 MG | ORAL | Qty: 1

## 2023-09-16 MED FILL — FOLIC ACID 1 MG TABLET: 1 1 MG | ORAL | Qty: 1

## 2023-09-16 MED FILL — METHOCARBAMOL 500 MG TABLET: 500 500 MG | ORAL | Qty: 1

## 2023-09-16 MED FILL — DIAZEPAM 5 MG TABLET: 5 5 MG | ORAL | Qty: 2

## 2023-09-16 MED FILL — VANCOMYCIN 1,000 MG INTRAVENOUS INJECTION: 1000 1000 mg | INTRAVENOUS | Qty: 750

## 2023-09-16 MED FILL — NICOTINE 21 MG/24 HR DAILY TRANSDERMAL PATCH: 21 21 mg/24 hr | TRANSDERMAL | Qty: 1

## 2023-09-16 MED FILL — OXYCODONE 5 MG TABLET: 5 5 MG | ORAL | Qty: 1

## 2023-09-16 MED FILL — LIDODERM 5 % TOPICAL PATCH: 5 5 % | TOPICAL | Qty: 1

## 2023-09-16 MED FILL — METHADONE 10 MG/ML ORAL CONCENTRATE: 10 10 mg/mL | ORAL | Qty: 4

## 2023-09-16 MED FILL — TRIUMEQ 600 MG-50 MG-300 MG TABLET: 600-50-300 600-50-300 mg | ORAL | Qty: 1

## 2023-09-16 MED FILL — PANTOPRAZOLE 40 MG INTRAVENOUS SOLUTION: 40 40 mg | INTRAVENOUS | Qty: 1

## 2023-09-16 MED FILL — NYSTATIN 100,000 UNIT/ML ORAL SUSPENSION: 100000 100,000 unit/mL | ORAL | Qty: 5

## 2023-09-16 MED FILL — LORAZEPAM 1 MG TABLET: 1 1 MG | ORAL | Qty: 2

## 2023-09-16 NOTE — Progress Notes (Signed)
Department of Internal Medicine  Daily Progress Note      Chief Complaint / Reason for Follow-Up     Alexander [name] is a 37 y.o. male on hospital day 2. The principal reason for today's follow up visit is Cellulitis of left lower extremity.    Interval History / Subjective     Patient underwent EGD, MRI lumbar spine, and CT L calf. See below for details.    NAEON. Pt somnolent but easily arousable this morning. Reports much increased leg pain but still present. Also reports continued nausea with stable lower back pain and lower abdominal pain. Still with intermittent diarrhea that has improved from yesterday somewhat. Patient reports not having much of an appetite still because of wanting to sleep a lot.       Review of Systems (Focused)     Denies fever, chills, chest pain, shortness of breath, and dysuria.      Medications     Scheduled Meds:   abacavir-dolutegravir-lamivud  1 tablet Oral Daily 0900    ceFEPime (MAXIPIME) IV extended infusion  2 g Intravenous Q8H    diazePAM  5 mg Oral TID    enoxaparin  40 mg Subcutaneous Daily 0900    fluconazole  100 mg Oral Daily 0900    gabapentin  300 mg Oral TID    lidocaine  1 patch Transdermal Q24H    methadone  40 mg Oral Daily 0900    methocarbamoL  500 mg Oral QID    nicotine  1 patch Transdermal Daily 0900    nystatin  500,000 Units Swish & Swallow PC/HS    pantoprazole (PROTONIX) IV  40 mg Intravenous BID6    thiamine (vitamin B1) IV orderable  200 mg Intravenous 3 times per day    vancomycin  15 mg/kg Intravenous Q8H     Continuous Infusions:   sodium chloride 0.9 %       PRN Meds:  acetaminophen, loperamide, LORazepam **OR** LORazepam, nicotine **AND** nicotine polacrilex, oxyCODONE, proCHLORPERazine       Vital Signs     Temp:  [97.7 F (36.5 C)-98.8 F (37.1 C)] 98.2 F (36.8 C)  Heart Rate:  [87-104] 99  Resp:  [22-24] 23  BP: (142-166)/(97-112) 142/112    Intake/Output Summary (Last 24 hours) at 09/16/2023 1459  Last data filed at 09/16/2023 0806  Gross per  24 hour   Intake 800 ml   Output 375 ml   Net 425 ml         Physical Exam     Physical Exam  Vitals reviewed.   Constitutional:       Appearance: He is ill-appearing and somnolent but easily arousable. He is not diaphoretic.   HENT:      Head: Normocephalic.      Nose: Nose normal. No rhinorrhea.      Mouth/Throat:      Comments: Diffuse white patches; Angular cheilitis - corners of mouth are cracked and bloody  Eyes:      General:         Right eye: No discharge.         Left eye: No discharge.      Extraocular Movements: Extraocular movements intact.   Cardiovascular:      Rate and Rhythm: Normal rate and regular rhythm.      Pulses: Normal pulses.      Heart sounds: Normal heart sounds. No murmur heard.     No friction rub. No gallop.  Pulmonary:      Effort: Pulmonary effort is normal. No respiratory distress.      Breath sounds: Normal breath sounds. No stridor. No wheezing, rhonchi or rales.   Abdominal:      General: Abdomen is flat. Bowel sounds are normal. There is no distension.      Palpations: Abdomen is soft.      Tenderness: There is diffuse abdominal tenderness to palpation. There is no guarding or rebound.      Comments: splenomegaly   Musculoskeletal:         General: Swelling and tenderness present. Normal range of motion. Swelling much improved compared to yesterday.     Cervical back: Normal range of motion.      Comments: LLE with significantly less erythema and edema, also now localized closer to mid-shin down to toes having regressed from patella upon ED presentation.  LLE also less tender to palpation today compared to yesterday.   Midline lower back pain tender to palpation.  Dark brown hyperpigmented and scarred plaque on lower R medial shin above medial malleolus   Skin:     General: Skin is warm and dry.      Capillary Refill: Capillary refill takes less than 2 seconds.      Findings: Erythema present.      Comments: LLE erythema improved from yesterday but still present   Neurological:       General: No focal deficit present.      Mental Status: He is alert and oriented to person, place, and time.   Psychiatric:         Mood and Affect: Mood normal.     Laboratory Data       CBC 09/16/2023                 \    12.4   /         5.8  \______/ 74                 /            \                             /   37.5    \ Differential 09/13/2023    N 79.6 L 9.8 M 9.7 E 0.3 B 0.6      Renal 09/16/2023       127      97        5    /    ___ _____________/ 90                                     \     4.7        23      0.48   \  Ca     8.3 (09/16/2023)  Mg     1.7 (09/16/2023)  Phos 3.6 (09/16/2023) Lipids    Lab Results   Component Value Date    CHOLTOT 51 09/14/2023    TRIG 58 09/14/2023    HDL 10 (L) 09/14/2023    LDL 29 09/14/2023      LFTs 09/16/2023               \      1.2      /              \  0.57    /         111  \______/  42               /            \              /    139      \ PT/INR/PTT - 09/16/2023    PT  16.7 INR  1.3 PTT  50.3                  Lab 09/14/23  0436   COLOR, URINE Yellow   CLARITY Cloudy*   SPECIFIC GRAVITY, URINE 1.024   PH UA 6.0   PROTEIN UA 30*   GLUCOSE UA Negative   KETONES UA Negative   BILIRUBIN UA Negative   BLOOD UA Negative   NITRITE UA Negative   UROBILINOGEN UA 12.0*   LEUKOCYTES UA Negative   RBC UA 1   WBC UA 2   BACTERIA Occasional*   MUCUS Present*             No results found for: NTPROBNP    Lab Results   Component Value Date    TSH 1.21 10/17/2017    FREET4 1.08 11/14/2013             Diagnostic Studies     I have reviewed today's renal panel, hepatic function panel, CBC, PT/INR labs, and lymphocyte subset flow cytometry labs.    Renal panel significant for low Na at 127, decreased from yesterday's also reduced value of 132. All other renal panel values stable.    Hepatic function panel, CBC, and PT/INR values stable.    Flow cytometry lymphocyte subset panel significant for significantly decreased absolute CD3 count of 587 compared to 03/09/21  value of 1888.2, and for Absolute CD 4 Helper cells reduced to 295 from 468 on 03/09/21 but still within normal limits.    Imaging:  09/15/23 - MRI Lumbar Spine W WO Contrast  1.  Small indeterminate enhancing foci involving L2 and L3 which are too small to characterize. The differential diagnosis includes atypical benign venous malformations, metastases would be unlikely with no known cancer. Atypical infectious lesions are also less likely, not absolutely excluded. Follow-up MRI would be helpful for further characterization at interval dictated by clinical symptomatology and concern.   2.  No evidence of discitis or epidural abscess at lumbar levels.   3.  Noncompressive degenerative disc disease at L4-5.   4.  Abnormal free fluid in the abdomen, potentially ascites of at least small to moderate quantity, but only trace ascites was discussed on recent ultrasound raising possibility of an increase. If there is further concern for infectious or other intra-abdominal process, follow-up CT abdomen and pelvis with contrast could be helpful for further evaluation.      09/15/23 - CT Calf-Tibia Fibula Left w/ IV Contrast  FINDINGS:     Osseous structures/Joints: No acute fracture or dislocation. No joint effusions. No evidence of arthrosis.     Muscles/Tendons: Muscle volume and attenuation appear normal. No retracted tendon tears.     Soft tissue: There is diffuse soft tissue edema. No focal fluid collections are identified.     IMPRESSION:     1. Findings consistent with diffuse cellulitis.      09/15/23 - EGD  Impression:            - Esophageal plaques were found, suspicious for  candidiasis. Cells for cytology obtained.                         - Moderately severe esophagitis. Biopsied.                         - Erythematous mucosa in the stomach. Biopsied.                         - Non-bleeding duodenal ulcers with no stigmata of                          bleeding. Biopsied.                          - Normal mucosa was found in the second portion of the                          duodenum.  Recommendation:        - Return patient to hospital ward for ongoing care.                         - Resume previous diet.                         - Continue present medications.                         - Await pathology results.     09/14/23 - VASC Venous Duplex Lower Extremity Ltd - Left  Conclusions: Normal LLE venous duplex exam. Enlarged lymph nodes left groin.      09/14/23 - US Abdomen Complete   IMPRESSION:     Cholelithiasis without CT evidence of acute cholecystitis.     Trace ascites.     Splenomegaly.      09/13/23 - XR L Tibia-Fibula  FINDINGS:     There is no evidence of fracture, malalignment, or other acute abnormality. There is extensive diffuse soft tissue swelling. No radiopaque foreign bodies are identified.    Impression   IMPRESSION:    No acute osseous abnormality         09/13/23 - XR L Foot  FINDINGS:     There is no evidence of fracture, malalignment, or other acute abnormality. There is extensive diffuse soft tissue swelling. No radiopaque foreign bodies are identified.    Impression   IMPRESSION:    No acute osseous abnormality         09/13/23 - CXR  IMPRESSION:   No acute cardiopulmonary abnormality.      Microbiological Data:  09/14/23 - Blood cultures x2 - NGTD     Abx:  IV Vancomycin and Cefepime - 10/9-10/13  PO Augmentin and Doxycycline - 10/13-est.10/23  PO Fluconazole - 10/11-11/1  Nystatin mouthwash      Assessment & Plan     Alexander [name] is a 37 y.o. male on HD# 2 with a history of recently untreated HIV, HCV, AUD, TUD, and OUD who presented to the hospital with 4 days of left lower leg swelling highly suspicious for Cellulitis of left lower extremity.  The medical issues being addressed in today's encounter are as follows:    Principal Problem:    Cellulitis of left  lower extremity  Active Problems:    Polysubstance abuse (CMS-HCC)    HIV infection    Chronic hepatitis C without  hepatic coma (CMS-HCC)    Alcohol withdrawal (CMS-HCC)    Angular cheilitis    Odynophagia      #LLE Cellulitis  On arrival to ED on 10/9, L leg found to be circumferentially edematous, erythematous, and tender to palpation up to the patient's L knee. On the L medial shin, a large dark brown/black hyperpigmented scarred plaque is noted which the patient reports was from a dog bite approximately 1.5-2 years ago. Patient was afebrile without chills but tachycardic and hypertensive upon arrival to ED, and blood cultures were drawn with antibiotics vancomycin and cefepime started. The patient is also unhoused. Patient's exam concerning for LLE cellulitis especially in the setting of chronic immunosuppression, previous dog bite, nonadherence to HIV treatment, and a lack of sanitary living conditions. Patient reported injected fentanyl into arm but not into legs. Left leg imaging without findings of subcutaneous emphysema or osteomyelitis. Left leg ultrasound was performed which showed no thrombus present. After administration of vancomycin and cefepime antibiotics, the erythema, pain, and edema markedly and rapidly improved. Patient afebrile with WBC count within normal limits at 9.4 upon presentation and 5.5 on first day after initiation of antibiotics but also in the setting of HIV infection and updated lymphocyte subset lab pending. ESR and CRP elevated at 57 and 14.4, respectively. Both blood cultures with no growth to date. Pain control also being managed. Patient has remained afebrile and hemodynamically stable since stabilization with antibiotics and pain management in the ED. Left lower leg, ankle, and foot edema, erythema, and tenderness still continuing to improve significantly each day as of 10/12.  - Infectious disease consulted primarily for HCV and recently untreated HIV but also recommended CT calf-tibia fibula Left w/ IV contrast to evaluate for underlying myositis or abscess              - follow-up CT  scan results  - Continue IV vancomycin and cefepime through end of today  - no need to add on flagyl for anaerobe coverage due to rapid and positive response to vancomycin and cefepime alone  - Vancomycin trough 10/11 decreased at 5.8   - Per ID recs, switch to 10-day PO Augmentin and doxycycline course tomorrow iso concern for possible eloping  - Pain control  - Tylenol 650 mg PO q8h PRN for mild and moderate pain  - Oxycodone 15 mg PO q3h PRN with possibility to increase dose to 20 mg and 5-10 mg from there slowly per day if needed per Addiction Medicine's recommendation  - Follow-up blood cultures              - 10/10 - NGTD  - Monitor LLE daily for progression or resolution of cellulitis and adjust abx regimen as needed     #Odynophagia  #Poor PO intake  #Ulcerative esophagitis  #Protein calorie-malnutrition  #Vitamin D Deficiency  Patient also reported pain with and difficulty eating and swallowing in the setting of recently untreated HIV over the past year. Also reported mouth pain, decreased appetite, and pain/burning sensation in throat, as well as white patches on the inside of his mouth confirmed on physical exam. Reported 40 pound weight loss over past 4 months. Patient BMI is less than 18 as of 09/15/23. Reported that he has been taking mouthwash, presumably nystatin, for these symptoms from the [facility] clinic and that it results in mild improvement.  Nutrition labs ordered to check nutrition status. Vitamin B9 and B12 within normal limits. Vitamin D (21.6) and Albumin (2.7) low.  - GI c/s and performed EGD today significant for esophageal plaques suspicious for candidiasis, moderately severe esophagitis, erythematous stomach mucosa, and non-bleeding duodenal ulcers with no stigmata of bleeding. Biopsies were obtained and final reads are pending. GI recommended resuming normal diet and continuing present medications.              - started fluconazole on 10/11 for Candidal esophagitis    - ID recommends  3 week course through 11/1.  - Obtain regular ECG's for fluconazole's Qtc prolonging effect monitoring while also taking methadone              - ECG in ED on 10/9 showed Qtc of 444  - IV PPI BID per GI - Pantoprazole 40 mg IV BID  - Transfuse to maintain Hgb > 7  - Nutrition c/s in the setting of decreased PO intake associated with odynophagia; follow-up recs  - Nystatin mouthwash 500kU 30 min after meals and QHS  - Vitamin D and Albumin low  - replete vitamin D PRN  - regular diet  - Holding scheduled Valium in the setting of patient being very somnolent, despite being arousable. Concern for decreased hydration and nutrition due to somnolence.     #HIV  Patient w/ HIV diagnosis on 02/05/17 on Triumeq. Has not been taking it consistently over the past year or so. Reported taking his medicine last about 2 months ago. It appears he has not seen an ID specialist since 2022. Per ID note, Last CD4 on 2022 468 23.1%, HIV RNA PCR 2022 35 and HIV Genosure prime 2022 not done HIV RNA less than 200. Repeated HIV RNA PCR 09/14/2023 less than 20. 10/10 Blood culture still with no reported growth to date. Patient reports recent history of night sweats and 40-pound weight loss over the past few months. Also reports decreased appetite, odynophagia, and difficulty swallowing with some improvement in mouth pain after receiving nystatin mouthwash from [facility]. As of 10/11, patient has remained afebrile and WBC count is within normal limits at 5.5. Pending updated lymphocyte subset lab.  - ID c/s; follow-up and appreciate recs.  - Start the patient on his HIV medication Triumeq.  - ESR and CRP elevated at 57 and 14.4, respectively  - Restarted Triumeq 600-50-300 mg tablet PO daily 10/11 at 0900 hrs  - Lymphocyte subset results with low absolute CD3 (587) and CD4 within normal limits (295) but decreased from 03/09/21 value (468).  - follow-up infectious labs              - T. Pallidum negative              - Crypto Ag negative               - Mycobacteria/TB negative              - EBV IgG positive, IgM negative              - BK negative              - CMV negative              - Fungitell pending              - Aspergillus Ag pending  - Hepatitis labs  - Hepatitis A non-reactive; administer Hep A vaccine when appropriate  - Hep B  non-immune  - HCV positive (see more details below)     #Back pain  Patient reported back pain and leg pain on day of admission and yesterday. Patient reported back pain is worse today than yesterday despite leg pain slightly improving. Patient appears to be significantly distressed by the pain. Exam demonstrates midline lumbar spine mild tenderness to palpation. History of IVDU. Patient also reported recent history of night sweats and 40-pound weight loss over the past few months. Both blood cultures with no growth to date as of 10/10. Patient afebrile with WBC count within normal limits at 9.4 upon presentation and 5.5 on first day after initiation of antibiotics but also in the setting of HIV infection and updated lymphocyte subset lab pending. ESR and CRP elevated at 57 and 14.4, respectively. Differential diagnosis includes but not limited to spinal epidural abscess, spinal osteomyelitis, Pott's disease, vertebral compression fracture, and lumbosacral sprain.  - MRI lumbar spine w/ w/o contrast pending  - Continue pain control as per above  - additional pain control with methocarbamol 500 mg PO QID     #Alcohol Use Disorder  #Opioid Use Disorder  #Tobacco Use Disorder  #Alcohol withdrawal  #Opioid withdrawal  Patient reported he is unhoused and usually consumes 1 case of beer per day and 2 g of fentanyl per day which he injects into his arm but not his leg. Also uses cocaine intermittently and smokes 1.5 ppd. Patient has shown signs of substance withdrawal in the form of nausea, diarrhea, mild hypertension into the 150s-160s, and yawning. Hepatic function panel significant for AST/ALT ratio >2 on ED  presentation and repeated panels.  - 10/10 UDS positive for barbiturates (likely bc placed on phenobarbital upon medicine admission by the night team), cocaine, fentanyl, and oxycodone  - Treat diarrhea symptomatically with Imodium 2 mg PO BID PRN  - Treat nausea with Prochlorperazine (Compazine) inj 5 mg IV q6h PRN  - Addiction Medicine consulted and made recs for simultaneous alcohol and opioid withdrawal management. Treatment plan now includes:  - Scheduled Methadone 40 mg PO at 0900 hrs, 10 mg PO at 1300 hrs, and 10 mg PO at 1800 hrs daily  - Patient required scheduled methadone 40 mg and two 10 mg PRN doses yesterday on 10/10, so with Addiction Medicine's guidance and supervision, will trial this new schedule  - Increased complexity managing methadone because of various drug-drug interactions that simultaneously increase (phenobarbital - discontinued) and decrease (fluconazole) the metabolism of methadone  - Methadone 10 mg PO q4h PRN for COWS > 8  - Holding scheduled Valium in the setting of patient being very somnolent, despite being arousable. Concern for decreased hydration and nutrition due to somnolence.  - Pending downtrending CIWA scores, consider removing discontinuing Valium  - Ativan 2 mg PO PRN for CIWA-Ar 11-16  - Ativan 4 mg PO PRN for CIWA-Ar >16  - NRT 21 mcg patch and 4 mg lozenge  - IN narcan upon discharge  - IV thiamine 200 mg q8h x3 days, followed by 100 mg IV daily x4 days (decreased PO absorption for individuals w/AUD)   - PO folic acid daily  - Gabapentin 300 mg PO TID  - Obtain regular ECG's for methadone Qtc prolonging effect monitoring while also taking fluconazole  - ECG in ED on 10/9 showed Qtc of 444  - Thiamine B1 lab pending, follow-up   - Follow-up Behavioral Health's/Addiction Medicine's recommendations     #Diarrhea  #Hypokalemia  Diarrhea the day after arrival to the  ED after not having used his usual substances alcohol and fentanyl. Low concern for infectious process or C.  Diff because the diarrhea per the patient began yesterday when the patient could not gain access to illicit substances, afebrile, WBC count at patient's baseline, not enough time for C. Diff infection to develop post-antibiotic treatment, stable abdominal exam with mild tenderness to palpation in the right upper quadrant in the setting of HCV and trace ascites, and denies abdominal pain at rest. 10/11 labs demonstrated mild hypokalemia (3.3), likely due to diarrhea associated with opioid withdrawal.  - Imodium 2 mg PO BID PRN  - Replete K+ PO PRN  - CTM patient with daily renal panels, patient interviews, and physical exams     #Hepatitis C  #Thrombocytopenia  #Splenomegaly  Patient HCV diagnosis as early as 2014. Has not received treatment per ID. HCV Ab reactive on 10/10. ID team consulted. Appreciate and follow-up recs. 10/9 hepatic function panel significant for elevated Alkaline Phosphatase (146), elevated AST (115), normal ALT (46), low albumin (3.2), and normal T. Protein, Indirect Bili, and T. Bili levels, with elevated D. Bili level at 0.8. Similar hepatic function panel levels redemonstrated on subsequent hepatic functional panels. 10/10 US Abdomen significant for trace ascites, splenomegaly, and cholelithiasis without evidence of acute cholecystitis. Platelet count low since ED presentation at 74, then 90 and 78. Suspect thrombocytopenia is likely due to combination of malnutrition and splenomegaly. HCV RNA Quant pending. Serum copper lab pending. 10/10 ID recs for HCV include:  - Outpatient follow-up for treatment  - Immune to hep B, needs hepatitis A immunization  - follow-up labs: HCV RNA Quant, serum copper     #Mild normocytic anemia  Hgb level upon ED presentation on 10/9 at 12.2. Repeat levels on 10/10 and 10/11 at 10.7 and 12.1. MCV within normal limits. May be due to malnutrition. Vitamin B12 and folic acid levels within normal limits. Iron and % Fe saturation levels decreased to 17 and 4.7,  respectively. Vital signs stable with largely normal HR, RR, BP, and SpO2. Hgb levels prior to this encounter show baseline >13 on average.  - Ferrous sulfate PO daily   - CTM with daily CBC     #Hyponatremia  Patient has had consistently low Na since presentation to the ED (130>130>132>127). Today, Na is 127. Suspected etiology likely to be due to low PO intake d/t odynophagia and decreased appetite in the setting of HIV, opioid and alcohol withdrawal, and infection.  - Encourage PO intake; mIVF 50 ml/hr 0.9% NS continuous  - Nutrition following; appreciate and follow-up recs  - CTM w/ daily renal panel  - Holding scheduled Valium in the setting of patient being very somnolent, despite being arousable. Concern for decreased hydration and nutrition due to somnolence.     DVT ppx: Enoxaparin 40 mg subcutaneous daily   F: Replete PRN  E: Replete PRN  Nutrition:  Diet/Nutrition Orders    Diet Regular(7)     Frequency: Effective Now     Number of Occurrences: Until Specified     Order Questions:      Suicide/Behavior Risk Modification? No       Code Status: Full Code    Signed:  Blair Heys, MD  09/16/2023, 2:59 PM

## 2023-09-16 NOTE — Progress Notes (Addendum)
Infectious Diseases Consult follow-up    Patient Name:  Alexander [name]   Admit Date:  09/13/2023 Hospital Day: 4  AUTHOR: Hal Hope, MD 09/16/2023     Reason for follow-up: HIV, left leg cellulitis, ulcerative esophagitis    Assessment/Recommendations:  Alexander [name] is a 37 year old male with a history of IV drug use , chronic hepatitis C, HIV diagnosed since 2018, last visit at [facility] in 2022 was on Triumeq, off treatment for 3 months due to insurance issue, who was admitted on 09/14/2023 with left leg cellulitis, history of dog bite 1 year ago, no fevers or chills.  Underwent EGD for anemia and weight loss, noted to have ulcerative esophagitis.  Also has poor oral hygiene.     Left leg cellulitis: Improving  -Left lower extremity CT scan with diffuse cellulitis  -Agree to continue cefepime and vancomycin while admitted, can be transition to Augmentin and doxycycline to complete 10-day course.    Ulcerative esophagitis:  -CMV quant not detected on 09/14/2023, still can have tissue invasive infection  -Follow-up on biopsy results  -Agree to continue fluconazole 200 mg daily for 3 weeks     HIV:  -History of nonadherence to ART, most recent HIV RNA quantLess than 20 copies per mL on 09/14/2023  -Resume Triumeq 1 tablet daily,   -Follow-up on lymphocyte subset     Chronic hepatitis C:  - Outpatient follow-up for treatment  -Immune to hep B, needs hepatitis A immunization  -Given moderate ascites, high concern of cirrhosis    ID will sign off seeing the patient daily. please page [phone] with any additional questions or concerns. Thank you for the consult.    Interval History: Feels significantly better, agrees to follow-up in IDC and be compliant with medications.      Review of Systems:  Positives appear in bold; otherwise listed symptoms are negative.  General: fever, chills  Cardio: chest pain, palpitations  Pulm: cough, shortness of breath  GI: nausea, diarrhea  GU: Dysuria  Skin: rash,  itching    Physical Exam:  BP (!) 154/98 (BP Location: Left upper arm, Patient Position: Lying) Comment: rn notified  Pulse 88   Temp 98.2 F (36.8 C) (Oral)   Resp 23   Ht 5' 9 (1.753 m)   Wt 117 lb (53.1 kg)   SpO2 100%   BMI 17.28 kg/m   Gen: Awake and alert.  No acute distress  HEENT: NCAT. Sclera white, EOMI, poor oral hygiene  Neck: Supple. No lymphadenopathy.   CV: RRR. s1s2 normal, No m/r/g.   Resp: Bilateral air entry present. No wheezing or rales.   Abd: BS+. Soft.  Distended  Skin: Left lower extremity cellulitis   Neuro: Alert oriented x3.  Spontaneous movement present in all four extremities.         Current Meds:   Current Facility-Administered Medications   Medication    abacavir-dolutegravir-lamivud (TRIUMEQ) 600-50-300 mg tablet 1 tablet    acetaminophen (TYLENOL) tablet 650 mg    cefepime (MAXIPIME) 2 g in sodium chloride 0.9% 100 mL IVPB ADDaptor    diazePAM (VALIUM) tablet 10 mg    enoxaparin (LOVENOX) syringe 40 mg/0.4 mL    fluconazole (DIFLUCAN) tablet 100 mg    gabapentin (NEURONTIN) capsule 300 mg    loperamide (IMODIUM) capsule 2 mg    LORazepam (ATIVAN) tablet 2 mg    Or    LORazepam (ATIVAN) tablet 4 mg    methadone (DOLOPHINE) 10 mg/mL oral concentrate 40 mg  methocarbamoL (ROBAXIN) tablet 500 mg    nicotine (NICODERM CQ) 21 mg/24 hr 1 patch    And    nicotine polacrilex (NICORETTE) gum 4 mg    nystatin (MYCOSTATIN) 100,000 unit/mL suspension 500,000 Units    oxyCODONE (ROXICODONE) immediate release tablet 20 mg    pantoprazole (PROTONIX) injection 40 mg    proCHLORPERazine (COMPAZINE) injection 5 mg    sodium chloride 0.9 % IV infusion    thiamine (B-1) injection 200 mg    vancomycin (VANCOCIN) 750 mg in sodium chloride 0.9 % 150 mL IVPB        Labs:    CBC 09/16/2023                 \    12.4   /         5.8  \______/ 74                 /            \                             /   37.5    \ Differential 09/13/2023    N 79.6 L 9.8 M 9.7 E 0.3 B 0.6        Renal  09/16/2023       127      97        5    /    ___ _____________/ 90                                     \     4.7        23      0.48   \    Ca     8.3* (10/12)  Mg     1.7 (10/12)  Phos 3.6 (10/12) LFTs 09/16/2023               \      1.2      /              \    0.57    /         111  \______/  42               /            \              /    139      \   Lipids  09/14/2023     Value   TChol 51 (10/10)    Trig 58 (10/10)    LDL 10* (10/10)   HDL 29 (54/09)    PT/INR/PTT - 09/16/2023    PT  16.7* (10/12) INR  1.3* (10/12) PTT  50.3* (10/09)              Signed:  Hal Hope, MD 09/16/2023 9:53 AM  Pager: [phone]

## 2023-09-16 NOTE — Discharge Instructions (Signed)
Subspecialty Follow-up Appointment Chart Note    Service: Infectious Diseases    Subspecialty Follow-up Disposition:  The patient requires subspecialty follow-up as noted below:     Note: This information is for [facility] Scheduling use only.  It is not the responsibility of the primary inpatient service to schedule this appointment.  Visit type:Faculty Clinic  Name of provider to schedule with: Dr Elon Jester Ahmed/ Dr Brown/Dr Allena Katz  Timing: The appointment should be scheduled in 2 weeks.  Location: [facility] to overbook if there are no open appointment slots?: No  Reason for follow up: HIV, hepatitis C, left leg cellulitis  Comments: On Triumeq  In case of scheduling conflict, contact Pager/Cell phone number: [phone]    Hal Hope, MD  09/16/2023  11:14 AM

## 2023-09-17 LAB — CBC
Hematocrit: 35 % — ABNORMAL LOW (ref 38.5–50.0)
Hemoglobin: 11.8 g/dL — ABNORMAL LOW (ref 13.2–17.1)
MCH: 27.2 pg (ref 27.0–33.0)
MCHC: 33.7 g/dL (ref 32.0–36.0)
MCV: 80.8 fL (ref 80.0–100.0)
MPV: 9.3 fL (ref 7.5–11.5)
Platelets: 84 10*3/uL — ABNORMAL LOW (ref 140–400)
RBC: 4.33 10*6/uL (ref 4.20–5.80)
RDW: 15.6 % — ABNORMAL HIGH (ref 11.0–15.0)
WBC: 5.5 10*3/uL (ref 3.8–10.8)

## 2023-09-17 LAB — RENAL FUNCTION PANEL W/EGFR
Albumin: 2.6 g/dL — ABNORMAL LOW (ref 3.5–5.7)
Anion Gap: 3 mmol/L (ref 3–16)
BUN: 8 mg/dL (ref 7–25)
CO2: 28 mmol/L (ref 21–33)
Calcium: 8.1 mg/dL — ABNORMAL LOW (ref 8.6–10.3)
Chloride: 98 mmol/L (ref 98–110)
Creatinine: 0.59 mg/dL — ABNORMAL LOW (ref 0.60–1.30)
EGFR: >90
Glucose: 98 mg/dL (ref 70–100)
Osmolality, Calculated: 266 mosm/kg — ABNORMAL LOW (ref 278–305)
Phosphorus: 3.6 mg/dL (ref 2.1–4.7)
Potassium: 4.4 mmol/L (ref 3.5–5.3)
Sodium: 129 mmol/L — ABNORMAL LOW (ref 133–146)

## 2023-09-17 LAB — OSMOLALITY, URINE: Osmolality, Ur: 377 mosm/kg (ref 50–1200)

## 2023-09-17 LAB — MAGNESIUM: Magnesium: 1.7 mg/dL (ref 1.5–2.5)

## 2023-09-17 LAB — SODIUM, URINE, RANDOM: Sodium, Ur: 91 mmol/L

## 2023-09-17 LAB — VANCOMYCIN, TROUGH: Vancomycin Tr: 10 ug/mL (ref 10.0–20.0)

## 2023-09-17 MED ORDER — doxycycline (MONODOX) capsule 100 mg
100 | Freq: Two times a day (BID) | ORAL | Status: AC
Start: 2023-09-17 — End: 2023-09-18
  Administered 2023-09-18 (×2): via ORAL

## 2023-09-17 MED ORDER — folic acid (FOLVITE) tablet 1 mg
1 | Freq: Every day | ORAL | Status: AC
Start: 2023-09-17 — End: 2023-09-24
  Administered 2023-09-17 – 2023-09-18 (×2): via ORAL

## 2023-09-17 MED ORDER — thiamine (B-1) injection 100 mg
100 | Freq: Every day | INTRAMUSCULAR | Status: AC
Start: 2023-09-17 — End: 2023-09-24
  Administered 2023-09-17 – 2023-09-18 (×2): via INTRAVENOUS

## 2023-09-17 MED ORDER — AMOXicillin-clavulanate (AUGMENTIN) 875-125 mg per tablet 1 tablet
875-125 | Freq: Two times a day (BID) | ORAL | Status: AC
Start: 2023-09-17 — End: 2023-09-18
  Administered 2023-09-18 (×2): via ORAL

## 2023-09-17 MED FILL — CEFEPIME 2 GRAM SOLUTION FOR INJECTION: 2 2 gram | INTRAMUSCULAR | Qty: 1

## 2023-09-17 MED FILL — NICOTINE 21 MG/24 HR DAILY TRANSDERMAL PATCH: 21 21 mg/24 hr | TRANSDERMAL | Qty: 1

## 2023-09-17 MED FILL — FOLIC ACID 1 MG TABLET: 1 1 MG | ORAL | Qty: 1

## 2023-09-17 MED FILL — VANCOMYCIN 1,000 MG INTRAVENOUS INJECTION: 1000 1000 mg | INTRAVENOUS | Qty: 750

## 2023-09-17 MED FILL — PANTOPRAZOLE 40 MG INTRAVENOUS SOLUTION: 40 40 mg | INTRAVENOUS | Qty: 1

## 2023-09-17 MED FILL — NYSTATIN 100,000 UNIT/ML ORAL SUSPENSION: 100000 100,000 unit/mL | ORAL | Qty: 5

## 2023-09-17 MED FILL — THIAMINE HCL (VITAMIN B1) 100 MG/ML INJECTION SOLUTION: 100 100 mg/mL | INTRAMUSCULAR | Qty: 1

## 2023-09-17 MED FILL — GABAPENTIN 300 MG CAPSULE: 300 300 MG | ORAL | Qty: 1

## 2023-09-17 MED FILL — FLUCONAZOLE 100 MG TABLET: 100 100 MG | ORAL | Qty: 1

## 2023-09-17 MED FILL — METHOCARBAMOL 500 MG TABLET: 500 500 MG | ORAL | Qty: 1

## 2023-09-17 MED FILL — ENOXAPARIN 40 MG/0.4 ML SUBCUTANEOUS SYRINGE: 40 40 mg/0.4 mL | SUBCUTANEOUS | Qty: 0.4

## 2023-09-17 MED FILL — LIDODERM 5 % TOPICAL PATCH: 5 5 % | TOPICAL | Qty: 1

## 2023-09-17 MED FILL — DIAZEPAM 5 MG TABLET: 5 5 MG | ORAL | Qty: 1

## 2023-09-17 MED FILL — TRIUMEQ 600 MG-50 MG-300 MG TABLET: 600-50-300 600-50-300 mg | ORAL | Qty: 1

## 2023-09-17 MED FILL — METHADONE 10 MG/ML ORAL CONCENTRATE: 10 10 mg/mL | ORAL | Qty: 4

## 2023-09-17 NOTE — Progress Notes (Signed)
Department of Internal Medicine  Daily Progress Note      Chief Complaint / Reason for Follow-Up     Alexander [name] is a 36 y.o. male on hospital day 3. The principal reason for today's follow up visit is Cellulitis of left lower extremity.    Interval History / Subjective     Surgical pathology from EGD still pending, although treating for candida esophagitis. Patient's CIWAs and COWs have been requiring, not requiring prn ativan in last 24 hours. His L leg pain is improving and feels the redness is improving. Eating better. Has not been ambulating well.     Review of Systems (Focused)     Review of Systems   Constitutional:  Negative for chills and fever.   Respiratory:  Negative for cough.    Cardiovascular:  Negative for chest pain.   Gastrointestinal:  Negative for heartburn.   Musculoskeletal:  Positive for back pain.        Medications     Scheduled Meds:   abacavir-dolutegravir-lamivud  1 tablet Oral Daily 0900    AMOXicillin-clavulanate  1 tablet Oral 2 times per day    doxycycline  100 mg Oral 2 times per day    enoxaparin  40 mg Subcutaneous Daily 0900    fluconazole  100 mg Oral Daily 0900    thiamine (vitamin B1) IV orderable  100 mg Intravenous Daily 0900    And    folic acid  1 mg Oral Daily 0900    gabapentin  300 mg Oral TID    lidocaine  1 patch Transdermal Q24H    methadone  40 mg Oral Daily 0900    methocarbamoL  500 mg Oral QID    nicotine  1 patch Transdermal Daily 0900    nystatin  500,000 Units Swish & Swallow PC/HS    pantoprazole (PROTONIX) IV  40 mg Intravenous BID6     Continuous Infusions:      PRN Meds:  acetaminophen, loperamide, LORazepam **OR** LORazepam, nicotine **AND** nicotine polacrilex, oxyCODONE, proCHLORPERazine       Vital Signs     Temp:  [98.2 F (36.8 C)-98.5 F (36.9 C)] 98.5 F (36.9 C)  Heart Rate:  [93-111] 103  Resp:  [20-23] 20  BP: (136-145)/(92-107) 139/95    Intake/Output Summary (Last 24 hours) at 09/17/2023 1434  Last data filed at 09/17/2023 1154  Gross  per 24 hour   Intake 1100 ml   Output 2600 ml   Net -1500 ml         Physical Exam     Physical Exam  Vitals reviewed.   Constitutional:       Appearance: He is ill-appearing and somnolent but easily arousable. He is not diaphoretic.   HENT:      Head: Normocephalic.      Nose: Nose normal. No rhinorrhea.      Mouth/Throat:      Comments: angular cheilitis has improved  Eyes:      General:         Right eye: No discharge.         Left eye: No discharge.      Extraocular Movements: Extraocular movements intact.   Cardiovascular:      Rate and Rhythm: Normal rate and regular rhythm.      Pulses: Normal pulses.      Heart sounds: Normal heart sounds. No murmur heard.     No friction rub. No gallop.   Pulmonary:  Effort: Pulmonary effort is normal. No respiratory distress.      Breath sounds: Normal breath sounds. No stridor. No wheezing, rhonchi or rales.   Abdominal:      Tenderness: There is diffuse abdominal tenderness and some distension 2/2 ascites. No guarding or reboud.   Musculoskeletal:         Comments: LLE erythema much improved from yesterday  Neurological:      General: No focal deficit present.      Mental Status: He is alert and oriented to person, place, and time.     Laboratory Data       CBC 09/17/2023                 \    11.8   /         5.5  \______/ 84                 /            \                             /   35.0    \ Differential 09/13/2023    N 79.6 L 9.8 M 9.7 E 0.3 B 0.6      Renal 09/17/2023       129      98        8    /    ___ _____________/ 98                                     \     4.4        28      0.59   \  Ca     8.1 (09/17/2023)  Mg     1.7 (09/17/2023)  Phos 3.6 (09/17/2023) Lipids    Lab Results   Component Value Date    CHOLTOT 51 09/14/2023    TRIG 58 09/14/2023    HDL 10 (L) 09/14/2023    LDL 29 09/14/2023      LFTs 09/16/2023               \      1.2      /              \    0.57    /         111  \______/  42               /            \              /     139      \ PT/INR/PTT - 09/16/2023    PT  16.7 INR  1.3 PTT  50.3                  Lab 09/14/23  0436   COLOR, URINE Yellow   CLARITY Cloudy*   SPECIFIC GRAVITY, URINE 1.024   PH UA 6.0   PROTEIN UA 30*   GLUCOSE UA Negative   KETONES UA Negative   BILIRUBIN UA Negative   BLOOD UA Negative   NITRITE UA Negative   UROBILINOGEN UA 12.0*   LEUKOCYTES UA Negative   RBC UA 1  WBC UA 2   BACTERIA Occasional*   MUCUS Present*             No results found for: NTPROBNP    Lab Results   Component Value Date    TSH 1.21 10/17/2017    FREET4 1.08 11/14/2013             Diagnostic Studies     I have reviewed today's renal panel, hepatic function panel, CBC, PT/INR labs, and lymphocyte subset flow cytometry labs.    Renal panel significant for low Na at 127, decreased from yesterday's also reduced value of 132. All other renal panel values stable.    Hepatic function panel, CBC, and PT/INR values stable.    Flow cytometry lymphocyte subset panel significant for significantly decreased absolute CD3 count of 587 compared to 03/09/21 value of 1888.2, and for Absolute CD 4 Helper cells reduced to 295 from 468 on 03/09/21 but still within normal limits.    Imaging:  09/15/23 - MRI Lumbar Spine W WO Contrast  1.  Small indeterminate enhancing foci involving L2 and L3 which are too small to characterize. The differential diagnosis includes atypical benign venous malformations, metastases would be unlikely with no known cancer. Atypical infectious lesions are also less likely, not absolutely excluded. Follow-up MRI would be helpful for further characterization at interval dictated by clinical symptomatology and concern.   2.  No evidence of discitis or epidural abscess at lumbar levels.   3.  Noncompressive degenerative disc disease at L4-5.   4.  Abnormal free fluid in the abdomen, potentially ascites of at least small to moderate quantity, but only trace ascites was discussed on recent ultrasound raising possibility of an increase. If  there is further concern for infectious or other intra-abdominal process, follow-up CT abdomen and pelvis with contrast could be helpful for further evaluation.      09/15/23 - CT Calf-Tibia Fibula Left w/ IV Contrast  FINDINGS:     Osseous structures/Joints: No acute fracture or dislocation. No joint effusions. No evidence of arthrosis.     Muscles/Tendons: Muscle volume and attenuation appear normal. No retracted tendon tears.     Soft tissue: There is diffuse soft tissue edema. No focal fluid collections are identified.     IMPRESSION:     1. Findings consistent with diffuse cellulitis.      09/15/23 - EGD  Impression:            - Esophageal plaques were found, suspicious for                          candidiasis. Cells for cytology obtained.                         - Moderately severe esophagitis. Biopsied.                         - Erythematous mucosa in the stomach. Biopsied.                         - Non-bleeding duodenal ulcers with no stigmata of                          bleeding. Biopsied.                         -  Normal mucosa was found in the second portion of the                          duodenum.  Recommendation:        - Return patient to hospital ward for ongoing care.                         - Resume previous diet.                         - Continue present medications.                         - Await pathology results.     09/14/23 - VASC Venous Duplex Lower Extremity Ltd - Left  Conclusions: Normal LLE venous duplex exam. Enlarged lymph nodes left groin.      09/14/23 - US Abdomen Complete   IMPRESSION:     Cholelithiasis without CT evidence of acute cholecystitis.     Trace ascites.     Splenomegaly.      09/13/23 - XR L Tibia-Fibula  FINDINGS:     There is no evidence of fracture, malalignment, or other acute abnormality. There is extensive diffuse soft tissue swelling. No radiopaque foreign bodies are identified.    Impression   IMPRESSION:    No acute osseous abnormality         09/13/23 - XR L  Foot  FINDINGS:     There is no evidence of fracture, malalignment, or other acute abnormality. There is extensive diffuse soft tissue swelling. No radiopaque foreign bodies are identified.    Impression   IMPRESSION:    No acute osseous abnormality         09/13/23 - CXR  IMPRESSION:   No acute cardiopulmonary abnormality.      Microbiological Data:  09/14/23 - Blood cultures x2 - NGTD     Abx:  IV Vancomycin and Cefepime - 10/9-10/13  PO Augmentin and Doxycycline - 10/13-est.10/23  PO Fluconazole - 10/11-11/1  Nystatin mouthwash      Assessment & Plan     BABU GOHN is a 37 y.o. male on HD# 3 with a history of recently untreated HIV, HCV, AUD, TUD, and OUD who presented to the hospital with 4 days of left lower leg swelling highly suspicious for Cellulitis of left lower extremity.  The medical issues being addressed in today's encounter are as follows:    Principal Problem:    Cellulitis of left lower extremity  Active Problems:    Polysubstance abuse (CMS-HCC)    HIV infection    Chronic hepatitis C without hepatic coma (CMS-HCC)    Alcohol withdrawal (CMS-HCC)    Angular cheilitis    Odynophagia      #LLE Cellulitis  Erythema non-purulent and improving from prior with antibiotics. Imaging without evidence of osteomyelitis, and no other sources of infection around hip or lower back.  -Started 10 day course of Augmentin and DCN  - Pain control  - Tylenol 650 mg PO q8h PRN for mild and moderate pain  - Oxycodone 15 mg PO q3h PRN with possibility to increase dose to 20 mg and 5-10 mg from there slowly per day if needed per Addiction Medicine's recommendation  - Follow-up blood cultures              - 10/10 -  NGTD     #Odynophagia  #Poor PO intake  #Ulcerative esophagitis  #Protein calorie-malnutrition  #Vitamin D Deficiency  Patient also reported pain with and difficulty eating and swallowing in the setting of recently untreated HIV over the past year. Also reported mouth pain, decreased appetite, and  pain/burning sensation in throat, as well as white patches on the inside of his mouth confirmed on physical exam. Reported 40 pound weight loss over past 4 months. Patient BMI is less than 18 as of 09/15/23.   - s/p EGD, pending biopsy results              - started fluconazole on 10/11 for Candidal esophagitis    - ID recommends 3 week course through 11/1.  - Obtain regular ECG's for fluconazole's Qtc prolonging effect monitoring while also taking methadone              - ECG in ED on 10/9 showed Qtc of 444  - IV PPI BID per GI - Pantoprazole 40 mg IV BID  - Transfuse to maintain Hgb > 7  - Nutrition c/s in the setting of decreased PO intake associated with odynophagia; follow-up recs  - Nystatin mouthwash 500kU 30 min after meals and QHS  - Vitamin D and Albumin low  - replete vitamin D PRN    #Alcohol Use Disorder  #Tobacco Use Disorder  Improving overall with most recent CIWAs 2-->1. Weaning off of scheduled valium.   -Stop valium today  -Cont CIWAs for today and prn ativan, plan to d/c tomorrow  -Recommend outpatient follow-up with addiction  -IV Thiamine and folate    #Opioid Use Disorder  Managing with methadone, but complicated by its interactions with fluconazole which decreases metabolism of the methadone to its inactive metabolite.   -Methadone 40 daily, can give extra 10 if concerned for withdrawal symptoms  -Will follow-up with methadone clinic on discharge     #HIV  Patient w/ HIV diagnosis on 02/05/17 on Triumeq. Has not been taking it consistently over the past year or so.   - Restarted Triumeq 600-50-300 mg tablet PO daily 10/11 at 0900 hrs  - Lymphocyte subset results with low absolute CD3 (587) and CD4 within normal limits (295) but decreased from 03/09/21 value (468).  - follow-up infectious labs              - T. Pallidum negative              - Crypto Ag negative              - Mycobacteria/TB negative              - EBV IgG positive, IgM negative              - BK negative              - CMV  negative              - Fungitell pending              - Aspergillus Ag pending  - Hepatitis labs  - Hepatitis A non-reactive; administer Hep A vaccine when appropriate  - Hep B non-immune  - HCV positive (see more details below)     #Back pain, improving  Patient reported back pain and leg pain on day of admission, but has been improving. No major concerning findings on MRI.   - Continue pain control as per above  - additional pain control with  methocarbamol 500 mg PO QID       #Diarrhea  Diarrhea 2/2 opioid withdrawals.   - Imodium 2 mg PO BID PRN  - Replete K+ PO PRN     #Hepatitis C  #Thrombocytopenia  #Splenomegaly  Patient HCV diagnosis as early as 2014. Has not received treatment per ID. HCV Ab reactive on 10/10. ID team consulted. Appreciate and follow-up recs. 10/9 hepatic function panel significant for elevated Alkaline Phosphatase (146), elevated AST (115), normal ALT (46), low albumin (3.2), and normal T. Protein, Indirect Bili, and T. Bili levels, with elevated D. Bili level at 0.8. Similar hepatic function panel levels redemonstrated on subsequent hepatic functional panels. 10/10 US Abdomen significant for trace ascites, splenomegaly, and cholelithiasis without evidence of acute cholecystitis. Platelet count low since ED presentation at 74, then 90 and 78. Suspect thrombocytopenia is likely due to combination of malnutrition and splenomegaly. HCV RNA Quant pending. Serum copper lab pending. 10/10 ID recs for HCV include:  - Outpatient follow-up for treatment  - Immune to hep B, needs hepatitis A immunization  - follow-up labs: HCV RNA Quant, serum copper     #Mild normocytic anemia  Hgb level upon ED presentation on 10/9 at 12.2. Repeat levels on 10/10 and 10/11 at 10.7 and 12.1. MCV within normal limits. May be due to malnutrition. Vitamin B12 and folic acid levels within normal limits. Iron and % Fe saturation levels decreased to 17 and 4.7, respectively. Vital signs stable with largely normal HR,  RR, BP, and SpO2. Hgb levels prior to this encounter show baseline >13 on average.  - Ferrous sulfate PO daily   - CTM with daily CBC     #Hyponatremia  Patient has had consistently low Na since presentation to the ED (130>130>132>127). Today, Na is 127. Suspected etiology likely to be due to low PO intake d/t odynophagia and worsened by cirrhosis.   - Encourage PO intake; mIVF 50 ml/hr 0.9% NS continuous  - Nutrition following; appreciate and follow-up recs  - CTM w/ daily renal panel  - Holding scheduled Valium in the setting of patient being very somnolent, despite being arousable. Concern for decreased hydration and nutrition due to somnolence.     DVT ppx: Enoxaparin 40 mg subcutaneous daily   F: Replete PRN  E: Replete PRN  Nutrition:  Diet/Nutrition Orders    Diet Regular(7)     Frequency: Effective Now     Number of Occurrences: Until Specified     Order Questions:      Suicide/Behavior Risk Modification? No       Code Status: Full Code    Signed:  Amado Nash, DO  Internal Medicine Resident PGY2

## 2023-09-17 NOTE — Plan of Care (Signed)
Problem: Patient will remain free of injury d/t fall  Goal: High Fall Risk Precautions  Outcome: Progressing     Problem: Acute Pain  Description: Patient's pain progressing toward patient's stated pain goal  Goal: Patient displays improved well-being such as baseline levels for pulse, BP, respirations and relaxed muscle tone or body posture  Outcome: Progressing

## 2023-09-17 NOTE — Progress Notes (Signed)
[facility]: Vancomycin Monitoring Consult       Alexander [name] is a 37 y.o. male currently being treated for cellulitis.    Patient has no known drug allergies or adverse reactions.    Pharmacy consulted for vancomycin management by [facility].      Current Anti-Infectives         Dose Frequency Start End    abacavir-dolutegravir-lamivud (TRIUMEQ) 600-50-300 mg tablet 1 tablet 1 tablet Daily 09/15/2023 --    Admin Instructions:  Administer with or without food.  Administer 2 hours before or 6 hours after cation-containing antacids or laxatives, sucralfate, oral supplements containing iron or calcium, or buffered medications.  Tablet may be crushed for enteral administration.    Route: Oral    cefepime (MAXIPIME) 2 g in sodium chloride 0.9% 100 mL IVPB ADDaptor 2 g Every 8 hours 09/14/2023 --    Admin Instructions: **DOUBLE CHECK LENGTH OF INFUSION BEFORE ADMINISTRATION**  Use ADDaptor product - Mix Thoroughly Before Administration    Route: Intravenous    fluconazole (DIFLUCAN) tablet 100 mg 100 mg Daily 09/16/2023 09/29/2023    Admin Instructions: DO NOT CRUSH OR CHEW.    Route: Oral    Linked Group 1: Placed in Followed by Linked Group        nystatin (MYCOSTATIN) 100,000 unit/mL suspension 500,000 Units 500,000 Units 30 min after meals and bedtime 09/14/2023 --    Route: Swish & Swallow    vancomycin (VANCOCIN) 750 mg in sodium chloride 0.9 % 150 mL IVPB 15 mg/kg  53.1 kg Every 8 hours 09/15/2023 --    Admin Instructions: Contact pharmacy if there is a question/concern of whether vancomycin should be given based on serum drug levels.    Route: Intravenous          documented within (last 72 hours)       Date/Time Action Medication Dose Rate    09/17/23 0704 New Bag    vancomycin (VANCOCIN) 750 mg in sodium chloride 0.9 % 150 mL IVPB 750 mg 150 mL/hr    09/16/23 2208 New Bag    vancomycin (VANCOCIN) 750 mg in sodium chloride 0.9 % 150 mL IVPB 750 mg 150 mL/hr    09/16/23 1407  New Bag    vancomycin (VANCOCIN) 750 mg in sodium chloride 0.9 % 150 mL IVPB 750 mg 150 mL/hr    09/16/23 0655 New Bag    vancomycin (VANCOCIN) 750 mg in sodium chloride 0.9 % 150 mL IVPB 750 mg 150 mL/hr    09/15/23 2239 New Bag    vancomycin (VANCOCIN) 750 mg in sodium chloride 0.9 % 150 mL IVPB 750 mg 150 mL/hr    09/15/23 1446 New Bag    vancomycin (VANCOCIN) 750 mg in sodium chloride 0.9 % 150 mL IVPB 750 mg 150 mL/hr    09/15/23 0238 New Bag    vancomycin (VANCOCIN) 750 mg in sodium chloride 0.9 % 150 mL IVPB 750 mg 150 mL/hr    09/14/23 1357 New Bag    vancomycin (VANCOCIN) 750 mg in sodium chloride 0.9 % 150 mL IVPB 750 mg 150 mL/hr            --Objective Data--     Vitals:    09/17/23 0138 09/17/23 0428 09/17/23 0446 09/17/23 0816   BP: (!) 143/99 (!) 136/92  (!) 138/103   Pulse: 111 107  107   Resp: 20 20  20    Temp: 98.5 F (36.9 C) 98.4 F (36.9  C)  98.2 F (36.8 C)   TempSrc: Oral Axillary  Oral   SpO2: 100% 98% 100% 98%   Weight:       Height:           I/O last 3 completed shifts:  In: 800 [P.O.:800]  Out: 1450 [Urine:1450]    WBC, BUN, Creatinine  (Last 7 days)        Today 0547 Yesterday 0713 10/11 0626    WBC             5.5                       5.8                       5.5              BUN             8                       5                       5               Creatinine             0.59                       0.48                       0.42                      Ideal body weight: 70.7 kg (155 lb 13.8 oz)    --Cultures--     Microbiology Results       Date and Time Order Name Sensitivity Status Organisms Specimen ID Source    09/14/2023  4:05 AM #2  Blood culture-Peripheral site 2  Preliminary  [id number] Peripheral    09/14/2023  1:39 AM #1  Blood culture-Peripheral site 1  Preliminary  [id number] Peripheral            --Vancomycin Concentrations--     Lab Results  (Last 7 days)        Today 0547 10/11 1424    Vanc Tr             10.0                       5.8                        --Assessment and Plan--        Patient is a 37 y.o. male being treated with vancomycin for cellulitis.    Patient is currently receiving vancomycin 750mg  IV q12h.  Vancomycin trough was therapeutic at 10.0 mg/L. Will continue vancomycin to 750mg  IV q8h.   Will check vancomycin serum concentration every 3-5 days or as clinically indicated.  Goal vancomycin trough of 10-15 mg/L.  Pharmacy will continue to monitor therapy for efficacy and toxicity.       Thank you for the consult.    Sumayya Muha E. Daelynn Blower, BS, PharmD, BCPS  Clinical Pharmacy Specialist, Internal Medicine  Preferred Communication: Epic Chat  09/17/2023

## 2023-09-18 LAB — RENAL FUNCTION PANEL W/EGFR
Albumin: 2.6 g/dL — ABNORMAL LOW (ref 3.5–5.7)
Anion Gap: 7 mmol/L (ref 3–16)
BUN: 8 mg/dL (ref 7–25)
CO2: 27 mmol/L (ref 21–33)
Calcium: 8.1 mg/dL — ABNORMAL LOW (ref 8.6–10.3)
Chloride: 96 mmol/L — ABNORMAL LOW (ref 98–110)
Creatinine: 0.52 mg/dL — ABNORMAL LOW (ref 0.60–1.30)
EGFR: >90
Glucose: 110 mg/dL — ABNORMAL HIGH (ref 70–100)
Osmolality, Calculated: 269 mosm/kg — ABNORMAL LOW (ref 278–305)
Phosphorus: 3.2 mg/dL (ref 2.1–4.7)
Potassium: 3.9 mmol/L (ref 3.5–5.3)
Sodium: 130 mmol/L — ABNORMAL LOW (ref 133–146)

## 2023-09-18 LAB — CBC
Hematocrit: 32.9 % — ABNORMAL LOW (ref 38.5–50.0)
Hemoglobin: 11 g/dL — ABNORMAL LOW (ref 13.2–17.1)
MCH: 27.5 pg (ref 27.0–33.0)
MCHC: 33.3 g/dL (ref 32.0–36.0)
MCV: 82.5 fL (ref 80.0–100.0)
MPV: 9.2 fL (ref 7.5–11.5)
Platelets: 66 10*3/uL — ABNORMAL LOW (ref 140–400)
RBC: 3.99 10*6/uL — ABNORMAL LOW (ref 4.20–5.80)
RDW: 15.9 % — ABNORMAL HIGH (ref 11.0–15.0)
WBC: 4.5 10*3/uL (ref 3.8–10.8)

## 2023-09-18 LAB — MAGNESIUM: Magnesium: 1.5 mg/dL (ref 1.5–2.5)

## 2023-09-18 MED ORDER — gabapentin (NEURONTIN) 300 MG capsule
300 | ORAL_CAPSULE | Freq: Three times a day (TID) | ORAL | 1 refills | Status: AC
Start: 2023-09-18 — End: ?
  Filled 2023-09-18: qty 90, 30d supply, fill #0

## 2023-09-18 MED ORDER — amLODIPine (NORVASC) 5 MG tablet
5 | ORAL_TABLET | Freq: Every day | ORAL | 0 refills | Status: AC
Start: 2023-09-18 — End: ?
  Filled 2023-09-18: qty 30, 30d supply, fill #0

## 2023-09-18 MED ORDER — AMOXicillin-clavulanate (AUGMENTIN) 875-125 mg per tablet
875-125 | ORAL_TABLET | Freq: Two times a day (BID) | ORAL | 0 refills | Status: AC
Start: 2023-09-18 — End: 2023-09-23
  Filled 2023-09-18: qty 10, 5d supply, fill #0

## 2023-09-18 MED ORDER — fluconazole (DIFLUCAN) 100 MG tablet
100 | ORAL_TABLET | Freq: Every day | ORAL | 0 refills | Status: AC
Start: 2023-09-18 — End: 2023-10-06
  Filled 2023-09-18: qty 17, 17d supply, fill #0

## 2023-09-18 MED ORDER — nicotine (NICODERM CQ) 21 mg/24 hr
21 | MEDICATED_PATCH | Freq: Every day | TRANSDERMAL | 0 refills
Start: 2023-09-18 — End: 2023-10-02
  Filled 2023-09-18: qty 28, 28d supply, fill #0

## 2023-09-18 MED ORDER — nicotine polacrilex (NICORETTE) 4 MG gum
4 | BUCCAL | 0 refills | Status: AC | PRN
Start: 2023-09-18 — End: ?
  Filled 2023-09-18: qty 110, 5d supply, fill #0

## 2023-09-18 MED ORDER — methadone (DOLOPHINE) 10 mg/mL
10 | Freq: Once | ORAL | Status: AC
Start: 2023-09-18 — End: 2023-09-18

## 2023-09-18 MED ORDER — acamprosate (CAMPRAL) 333 mg tablet
333 | ORAL_TABLET | Freq: Three times a day (TID) | ORAL | 0 refills | Status: AC
Start: 2023-09-18 — End: 2023-10-18
  Filled 2023-09-18: qty 180, 30d supply, fill #0

## 2023-09-18 MED ORDER — nystatin (MYCOSTATIN) 100,000 unit/mL suspension
100000 | Freq: Four times a day (QID) | ORAL | 0 refills | Status: AC
Start: 2023-09-18 — End: ?
  Filled 2023-09-18: qty 473, 24d supply, fill #0

## 2023-09-18 MED ORDER — methadone (DOLOPHINE) 10 mg/mL oral concentrate 10 mg
10 | Freq: Once | ORAL | Status: AC
Start: 2023-09-18 — End: 2023-09-18
  Administered 2023-09-18: 20:00:00 via ORAL

## 2023-09-18 MED ORDER — pantoprazole (PROTONIX) 40 MG tablet
40 | ORAL_TABLET | Freq: Every morning | ORAL | 0 refills | Status: AC
Start: 2023-09-18 — End: ?
  Filled 2023-09-18: qty 30, 30d supply, fill #0

## 2023-09-18 MED ORDER — doxycycline (MONODOX) 100 MG capsule
100 | ORAL_CAPSULE | Freq: Two times a day (BID) | ORAL | 0 refills | Status: AC
Start: 2023-09-18 — End: 2023-09-23
  Filled 2023-09-18: qty 10, 5d supply, fill #0

## 2023-09-18 MED ORDER — methocarbamoL (ROBAXIN) 500 MG tablet
500 | ORAL_TABLET | Freq: Four times a day (QID) | ORAL | 1 refills | Status: AC
Start: 2023-09-18 — End: ?
  Filled 2023-09-18: qty 120, 30d supply, fill #0

## 2023-09-18 MED ORDER — albuterol 90 mcg/actuation Inhl inhaler
90 | Freq: Four times a day (QID) | RESPIRATORY_TRACT | 0 refills | Status: AC | PRN
Start: 2023-09-18 — End: ?
  Filled 2023-09-18: qty 18, 50d supply, fill #0

## 2023-09-18 MED ORDER — naloxone (NARCAN) 4 mg/actuation Spry
4 | NASAL | 0 refills | Status: AC | PRN
Start: 2023-09-18 — End: ?
  Filled 2023-09-18: qty 2, 1d supply, fill #0

## 2023-09-18 MED ORDER — methadone (DOLOPHINE) 10 mg/mL
10 | Freq: Once | ORAL
Start: 2023-09-18 — End: 2023-09-18

## 2023-09-18 MED FILL — NICOTINE (POLACRILEX) 4 MG GUM: 4 4 MG | BUCCAL | Qty: 1

## 2023-09-18 MED FILL — GABAPENTIN 300 MG CAPSULE: 300 300 MG | ORAL | Qty: 1

## 2023-09-18 MED FILL — METHADONE 10 MG/ML ORAL CONCENTRATE: 10 10 mg/mL | ORAL | Qty: 1

## 2023-09-18 MED FILL — AMOXICILLIN 875 MG-POTASSIUM CLAVULANATE 125 MG TABLET: 875-125 875-125 mg | ORAL | Qty: 1

## 2023-09-18 MED FILL — NYSTATIN 100,000 UNIT/ML ORAL SUSPENSION: 100000 100,000 unit/mL | ORAL | Qty: 5

## 2023-09-18 MED FILL — METHADONE 10 MG/ML ORAL CONCENTRATE: 10 10 mg/mL | ORAL | Qty: 4

## 2023-09-18 MED FILL — DOXYCYCLINE MONOHYDRATE 100 MG CAPSULE: 100 100 MG | ORAL | Qty: 1

## 2023-09-18 MED FILL — ENOXAPARIN 40 MG/0.4 ML SUBCUTANEOUS SYRINGE: 40 40 mg/0.4 mL | SUBCUTANEOUS | Qty: 0.4

## 2023-09-18 MED FILL — NICOTINE 21 MG/24 HR DAILY TRANSDERMAL PATCH: 21 21 mg/24 hr | TRANSDERMAL | Qty: 1

## 2023-09-18 MED FILL — TRIUMEQ 600 MG-50 MG-300 MG TABLET: 600-50-300 600-50-300 mg | ORAL | Qty: 1

## 2023-09-18 MED FILL — METHOCARBAMOL 500 MG TABLET: 500 500 MG | ORAL | Qty: 1

## 2023-09-18 MED FILL — LIDODERM 5 % TOPICAL PATCH: 5 5 % | TOPICAL | Qty: 1

## 2023-09-18 MED FILL — OXYCODONE 15 MG TABLET: 15 15 MG | ORAL | Qty: 1

## 2023-09-18 MED FILL — PANTOPRAZOLE 40 MG INTRAVENOUS SOLUTION: 40 40 mg | INTRAVENOUS | Qty: 1

## 2023-09-18 MED FILL — FOLIC ACID 1 MG TABLET: 1 1 MG | ORAL | Qty: 1

## 2023-09-18 NOTE — Progress Notes (Signed)
Physical Therapy  Physical Therapy  Initial Assessment and Discharge     Name: Alexander [name]  DOB: Feb 15, 1986  Attending Physician: Emmie Niemann*  Admission Diagnosis: HIV disease (CMS-HCC) [B20]  Cellulitis of left lower extremity [L03.116]  HIV infection, unspecified symptom status (CMS-HCC) [Z21]  Date: 09/18/2023  Room: 5409/W1191  Reviewed Pertinent hospital course: Yes    Hospital Course PT/OT: 37 yo male presents with worsening L LE swelling over last 4 days consistent with cellulitis.  L LE doppler: neg. Abd [place] - cholelithiasis, trace ascites, splenomegaly.  CT L calf - findings consistent with diffuse cellulitis. 10/11: MRI L-spine - Small indeterminate enhancing foci involving L2 and L3 - differential diagnosis atypical benign venous malformation, infectious lesion - less likely, no evidence of discitis or epidural abscess, DDD L4-5.  GI c/s for dysphagia/odynophagia, recommend EGD.  10/11: EGD - Esophageal plaques were found, suspicious for candidiasis, Moderately severe esophagitis - Biopsied, Erythematous mucosa in the stomach - Biopsied, non-bleeding duodenal ulcer  Relevant PMH : HIV, Hep C, IVDU, liver dz, SUD, bipolar, anxiety, active fentanyl use  Precautions: none  Activity Level: Activity as tolerated    Assist: Co-evaluation performed    Assessment  Assessment: No impairments  Pt demonstrates independence with functional mobility.  Pt without additional acute PT needs.    Recommendation  Recommendation: No skilled PT  Equipment Recommended: None    AM-PAC 6 Clicks Basic Mobility Inpatient Short Form: PT 6 Clicks Score: 24     Mobility Recommendations for Staff  Patient ability: Patient ambulates in hallway  Assist needed: with supervision    Home Living/Prior Function  Patient able to provide accurate information at this time: Yes  Type of Home: Homeless  Prior Function  Functional Mobility: Independent ( no assistive device)  ADL Assistance: Independent     Pain  Pain Score: 6    Pain Location: Back  Pain Descriptors: Throbbing  Pain Intervention(s): Ambulation/increased activity;Repositioned  Therapist reported pain to: RN    Vision  Hearing/Vision/Perception  Hearing: No hearing deficits noted  Baseline Vision: Wears glasses all the time  Overall Vision/ Perception: Within Functional Limits     Cognition  Overall Cognitive Status: Within Functional Limits  Cognitive Assessment: Arousal/ Alertness;Orientation Level;Behavior;Following Commands;Safety Judgment;Insight;Memory  Arousal/Alertness: Alert  Orientation Level: Oriented X4  Behavior: Appropriate;Cooperative  Following Commands: Follows all commands and directions without difficulty  Safety Judgment: Good awareness of safety precautions  Insight: Demonstrated intact insight into limitation and abilities to complete ADL's safely    Neuromuscular  Overall Sensation: Within Functional Limits (sensation intact to light touch B LE)    Upper Extremity  UE Assessment: Strength WFL ( at least 3+/5) as observed during functional activity    Lower Extremity  Lower Extremity  LE Assessment: Strength WFL (at least 3+/5) as observed during functional activity    Functional Mobility  Bed Mobility   Supine to Sit: Independent  Sit to Supine: Independent    Transfers  Sit to Stand:     Gait  Distance (in feet): 200'  Level of assistance: Independent  Assistive Device: None  Gait Characteristics: Steady;R decreased step length;L decreased step length;decreased cadence (slightly antaglic    Balance  Sitting - Static: Independent  Sitting-Dynamic: Independent   Standing-Static: Independent  Standing-Dynamic: Independent     Gait belt used: Yes    Position after Therapy/Safety Handoff  Position after treatment and safety handoff  Position after therapy session: Bed  Details: Call light/ needs within reach  Alarms: Bed  Alarms Status: Activated and Interfaced with call system    Goals  No PT goals established secondary to patient with no acute skilled  PT needs. Patient stated goal(s) is/are to go home--addressed at time of eval. Discharge patient from inpatient PT services at this time.    Patients and/or caregivers as well as practitioners mutually agreed upon the above goal(s)/plan.    Patient/Family Education  Educated patient on the role of physical therapy, goals, plan of care, and discharge recommendations and fall prevention strategies, including need for supervision/ assistance with OOB activity; patient verbalized understanding and demonstrated understanding. Handout(s) issued: none.    Plan  Plan  PT Frequency: One time visit--discharge from PT    The plan of care and recommendations assesses the patient's and/or caregiver's readiness, willingness, and ability to provide or support functional mobility and ADL tasks as needed upon discharge.        Time  Start Time: 1134  Stop Time: 1153  Time Calculation (min): 19 min    Charges   $PT Evaluation Low Complex 20 Min: 1 Procedure                    Problem List  Patient Active Problem List   Diagnosis    Foreign body in cornea    Other, mixed, or unspecified nondependent drug abuse, unspecified (CMS-HCC)    Drug-induced mood disorder (CMS-HCC)    Bipolar affective disorder, depressed (CMS-HCC)    Severe bipolar I disorder, current or most recent episode depressed (CMS-HCC)    Mood disorder (CMS-HCC)    Bipolar I disorder, most recent episode depressed (CMS-HCC)    Tylenol overdose    Bipolar disorder (CMS-HCC)    Alcohol abuse    Polysubstance abuse (CMS-HCC)    Persistent mood disorder (CMS-HCC)    HIV infection    Chronic hepatitis C without hepatic coma (CMS-HCC)    Suicidal ideation    Elevated liver enzymes    Peripheral polyneuropathy    Hematospermia    Alcohol withdrawal (CMS-HCC)    Cellulitis    Opioid use disorder    Cough with hemoptysis    Transaminitis    Cellulitis of left lower extremity    Angular cheilitis    Odynophagia      Past Medical History  Past Medical History:   Diagnosis Date     Anxiety     Bipolar disorder (CMS-HCC)     Bronchitis     Depression     Drug abuse and dependence (CMS-HCC)     H/O tooth extraction     Hepatitis C     HIV infection (CMS-HCC)     Liver disease Hep C    PTSD (post-traumatic stress disorder)     Substance induced mood disorder (CMS-HCC)     Thyroid disease       Past Surgical History  Past Surgical History:   Procedure Laterality Date    ESOPHAGOGASTRODUODENOSCOPY N/A 09/15/2023    Procedure: EGD WITH BIOPSY;  Surgeon: Royetta Asal, MD;  Location: [facility];  Service: Gastroenterology;  Laterality: N/A;    MANDIBLE SURGERY      OPEN REDUCTION INTERNAL FIXATION MANDIBLE Bilateral 01/27/2021    Procedure: OPEN REDUCTION INTERNAL FIXATION MANDIBLE, MAXILLOMANDIBULAR FIXATION;  Surgeon: Ellery Plunk, MD;  Location: [facility] OR;  Service: ENT;  Laterality: Bilateral;

## 2023-09-18 NOTE — Discharge Summary (Signed)
[place]  Inpatient Discharge Summary     Patient: Alexander [name]  Age: 37 y.o.    MRN: [id number]   CSN: [id number]    Date of Admission: 09/13/2023  Date of Discharge: 09/18/2023  Attending Physician: Emmie Niemann*   Primary Care Physician: Herbert Pun, MD     Diagnoses Present on Admission     Past Medical History:   Diagnosis Date    Anxiety     Bipolar disorder (CMS-HCC)     Bronchitis     Depression     Drug abuse and dependence (CMS-HCC)     H/O tooth extraction     Hepatitis C     HIV infection (CMS-HCC)     Liver disease Hep C    PTSD (post-traumatic stress disorder)     Substance induced mood disorder (CMS-HCC)     Thyroid disease         Discharge Diagnoses     Active Hospital Problems    Diagnosis Date Noted    Cellulitis of left lower extremity [L03.116] 09/14/2023    Angular cheilitis [K13.0] 09/14/2023    Odynophagia [R13.10] 09/14/2023    Alcohol withdrawal (CMS-HCC) [F10.939] 02/01/2020    Chronic hepatitis C without hepatic coma (CMS-HCC) [B18.2] 05/17/2017    HIV infection [B20] 03/07/2017    Polysubstance abuse (CMS-HCC) [F19.10] 12/11/2013      Resolved Hospital Problems   No resolved problems to display.       Operations/Procedures Performed (include dates)     Surgeries:  Surgical/Procedural Cases on this Admission       Case IDs Date Procedure Surgeon Location Status    [id number] 09/15/23 EGD WITH BIOPSY Royetta Asal, MD [facility]            Lines and tubes:  Patient Lines/Drains/Airways Status       Active Line / PIV Line       Name Placement date Placement time Site Days    Peripheral IV 09/14/23 Right;Posterior Hand 09/14/23  0023  Hand  4    Peripheral IV 09/14/23 Right Antecubital 09/14/23  --  Antecubital  4                    Other Procedures / Pertinent Imaging:  09/15/23 - MRI Lumbar Spine  IMPRESSION:     1. Small indeterminate enhancing foci involving L2 and L3 which are too small to characterize. The differential diagnosis includes atypical benign venous  malformations, metastases would be unlikely with no known cancer. Atypical infectious lesions are also less likely, not absolutely excluded. Follow-up MRI would be helpful for further characterization at interval dictated by clinical symptomatology and concern.   2.  No evidence of discitis or epidural abscess at lumbar levels.   3.  Noncompressive degenerative disc disease at L4-5.   4.  Abnormal free fluid in the abdomen, potentially ascites of at least small to moderate quantity, but only trace ascites was discussed on recent ultrasound raising possibility of an increase. If there is further concern for infectious or other intra-abdominal process, follow-up CT abdomen and pelvis with contrast could be helpful for further evaluation.     09/15/23 - CT Calf-Tibia Fibula Left With IV cont  IMPRESSION:     1. Findings consistent with diffuse cellulitis.      Results for orders placed during the hospital encounter of 09/13/23    VASC Venous Duplex Lower Extremity Ltd - Left    Narrative  Left:  B-mode imaging demonstrates normal compressibility of all venous segments examined, in the left leg and contralateral right common femoral vein, with no evidence for the presence of thrombus.  Doppler signals document spontaneous, phasic patterns with appropriate response to augmentation maneuvers and absence of venous reflux. Enlarged lymph nodes noted in the left groin with the largest measuring 0.74 cm by 3.92 cm.  Conclusions: Normal LLE venous duplex exam.Enlarged lymph nodes left groin.  Procedure: Lower extremity venous imaging was performed using real time B-mode imaging in transverse plane with and without compression.  Visualization included the common femoral vein (CFV) including the saphenofemoral junction, femoral vein (FV), popliteal vein, calf veins and the great saphenous vein in the thigh.  Color and spectral Doppler were used to document flow characteristics from the CFV, FV and popliteal vein to evaluate for  the presence or absence of spontaneous flow and respiratory phasic variation.  Augmentation maneuvers were performed as needed to document venous flow response and valve competence.     Results for orders placed or performed during the hospital encounter of 06/01/20   ECG 12 lead (MUSE)    Narrative    Ventricular Rate:  71  BPM  Atrial Rate:  71  BPM  P-R Interval:  124  ms  QRS Duration:  90  ms  QT:  378  ms  QTc:  410  ms  P Axis:  51  degrees  R Axis:  32  degrees  T Axis:  15  degrees  Diagnosis Line:  INTERPRETATION NOT AVAILABLE--ECG READ IN ER ^ Confirmed by PHYSICIAN, ER ([id number]), editor LALLY, LINDA ([id number]) on 06/02/2020 8:42:08 AM    Results for orders placed during the hospital encounter of 09/13/23    US Abdomen Complete    Narrative  EXAM: US ABDOMEN COMPLETE    INDICATION:  Elevated LFTs    COMPARISON:  CT 01/13/2012, 01/24/2021    TECHNIQUE: Grayscale imaging was performed for evaluation of the liver, gallbladder, common bile duct, pancreas, spleen, and kidneys; with limited grayscale and color Doppler evaluation of the upper abdominal aorta and inferior vena cava.    FINDINGS:    Liver: Normal contour and echogenicity.  No focal hepatic lesions.    Biliary/CBD: 5 mm. No intra or extrahepatic ductal dilatation.    Gallbladder: Cholelithiasis.  No wall thickening or pericholecystic fluid.    Pancreas: Visualized portions are unremarkable.    Right kidney: 12.5 cm in length.  Normal parenchymal echogenicity.  No hydronephrosis.    Left kidney: 13 cm in length.  Normal parenchymal echogenicity.  No hydronephrosis.    Spleen: 16 cm.    Aorta and IVC: Visualized portions of the abdominal aorta are not aneurysmal. The retrohepatic IVC is color Doppler patent.    Other: Trace free intraperitoneal fluid.    09/13/23 - XR Tibia-Fibula Left and XR Foot Left  IMPRESSION:     No acute osseous abnormality     09/13/23 - XR Chest PA and Lateral  IMPRESSION:   No acute cardiopulmonary abnormality.     EGD Impression:               - Esophageal plaques were found, suspicious for candidiasis. Cells for cytology obtained.  - Moderately severe esophagitis. Biopsied.  - Erythematous mucosa in the stomach. Biopsied.  - Non-bleeding duodenal ulcers with no stigmata of   bleeding. Biopsied.  - Normal mucosa was found in the second portion of the duodenum.    Consulting Services (include reason)  1. Behavioral Health - withdrawal guidance from fentanyl and alcohol  2. Infectious Disease - HIV and Hep C medical management  3. Gastroenterology - EGD scope evaluation in the setting of history of odynophagia, HIV, and oral thrush  4. Nutrition - nutrition guidance in the setting of a history of odynophagia, homelessness, and alcohol use disorder    Allergies     No Known Drug Allergies or Adverse Reactions    Discharge Medications        Medication List        TAKE these medications, which are NEW        Quantity/Refills   AMOXicillin-clavulanate 875-125 mg per tablet  Commonly known as: AUGMENTIN  Take 1 tablet by mouth every 12 hours for 5 days.   Quantity: 10 tablet  Refills: 0     doxycycline 100 MG capsule  Commonly known as: MONODOX  Take 1 capsule (100 mg total) by mouth every 12 hours for 5 days.   Quantity: 10 capsule  Refills: 0     fluconazole 100 MG tablet  Commonly known as: DIFLUCAN  Take 1 tablet (100 mg total) by mouth daily for 17 days.  Start taking on: September 19, 2023   Quantity: 17 tablet  Refills: 0     gabapentin 300 MG capsule  Commonly known as: NEURONTIN  Take 1 capsule (300 mg total) by mouth 3 times a day.   Quantity: 90 capsule  Refills: 1     methadone 10 mg/mL  Commonly known as: DOLOPHINE  Take 5 mLs (50 mg total) by mouth 1 time for 1 dose. Indications: opioid use disorder   For: opioid use disorder  Refills: 0     methocarbamoL 500 MG tablet  Commonly known as: ROBAXIN  Take 1 tablet (500 mg total) by mouth 4 times daily before meals and at bedtime.   Quantity: 120 tablet  Refills: 1     nicotine 21 mg/24  hr  Commonly known as: NICODERM CQ  Place 1 patch onto the skin daily.  Start taking on: September 19, 2023   Quantity: 28 patch  Refills: 0     nicotine polacrilex 4 MG gum  Commonly known as: NICORETTE  Place 1 each (4 mg total) inside cheek every hour as needed for nicotine cravings.   Quantity: 100 Stick  Refills: 0     nystatin 100,000 unit/mL suspension  Commonly known as: MYCOSTATIN  Swish and swallow 5 mLs (500,000 Units total) 4 times a day.   Quantity: 473 mL  Refills: 0     pantoprazole 40 MG tablet  Commonly known as: PROTONIX  Take 1 tablet (40 mg total) by mouth every morning before breakfast.   Quantity: 30 tablet  Refills: 0            TAKE these medications, which you were ALREADY TAKING        Quantity/Refills   albuterol 90 mcg/actuation inhaler  Commonly known as: PROVENTIL-PROAIR-VENTOLIN  Inhale 1 puff into the lungs every 6 hours as needed.   Quantity: 8 g  Refills: 0     amLODIPine 5 MG tablet  Commonly known as: NORVASC  Take 1 tablet (5 mg total) by mouth daily.   Quantity: 30 tablet  Refills: 0     naloxone 4 mg/actuation Spry  Commonly known as: NARCAN  Apply 1 spray in one nostril if needed. Call 911. May repeat dose in other nostril if no response in 3 minutes.   Quantity:  2 each  Refills: 0     Triumeq 600-50-300 mg Tab  Generic drug: abacavir-dolutegravir-lamivud  Take 1 tablet by mouth daily.   Quantity: 30 tablet  Refills: 2               Where to Get Your Medications        These medications were sent to [facility]  [address], [place] [address]      Hours: Sunday - Saturday: 8:00AM - 6:00PM Phone: [phone]   albuterol 90 mcg/actuation inhaler  amLODIPine 5 MG tablet  AMOXicillin-clavulanate 875-125 mg per tablet  doxycycline 100 MG capsule  fluconazole 100 MG tablet  gabapentin 300 MG capsule  methocarbamoL 500 MG tablet  naloxone 4 mg/actuation Spry  nicotine 21 mg/24 hr  nicotine polacrilex 4 MG gum  nystatin 100,000 unit/mL  suspension  pantoprazole 40 MG tablet  Triumeq 600-50-300 mg Tab       Information about where to get these medications is not yet available    Ask your nurse or doctor about these medications  methadone 10 mg/mL             Discharge Exam     Physical Exam  Constitutional:       General: He is not in acute distress.     Appearance: He is ill-appearing. He is not toxic-appearing or diaphoretic.      Comments: Chronically ill-appearing   HENT:      Nose: Nose normal.      Mouth/Throat:      Mouth: Mucous membranes are moist.      Comments: Poor dentition and oral thrush  Eyes:      Conjunctiva/sclera: Conjunctivae normal.   Cardiovascular:      Rate and Rhythm: Normal rate and regular rhythm.      Heart sounds: Normal heart sounds. No murmur heard.     No friction rub. No gallop.   Pulmonary:      Effort: Pulmonary effort is normal. No respiratory distress.      Breath sounds: Normal breath sounds. No stridor. No wheezing, rhonchi or rales.   Abdominal:      General: Bowel sounds are normal. There is distension.      Palpations: There is mass.      Tenderness: There is abdominal tenderness. There is no guarding or rebound.      Hernia: No hernia is present.      Comments: Mildly distended and firm; mild tenderness to palpation; splenomegaly   Musculoskeletal:         General: Tenderness present. No swelling, deformity or signs of injury. Normal range of motion.      Cervical back: Normal range of motion.      Right lower leg: No edema.      Left lower leg: No edema.      Comments: Midline tenderness to palpation over lumbar spine similar prior exams   Skin:     General: Skin is warm and dry.      Capillary Refill: Capillary refill takes less than 2 seconds.      Coloration: Skin is not jaundiced.      Findings: Erythema and lesion present. No bruising.      Comments: Hyperpigmented scarred plaque on L inferior medial shin; area of mild erythema and localized edema present on L anterolateral shin with dramatic  improvement in erythema, edema, and tenderness to palpation   Neurological:      General: No  focal deficit present.      Mental Status: He is alert and oriented to person, place, and time.      Motor: No weakness.   Psychiatric:         Mood and Affect: Mood normal.         Behavior: Behavior normal.           Reason for Admission     AIRIK GUILBERT is a 37 y.o. male with a history of HIV, HVC, SUD, EtOH use disorder with prior severe withdrawal who presented with LLE redness and swelling x 4 days found to be cellulitis.       Hospital Course     Active Hospital Problems    Diagnosis Date Noted    Cellulitis of left lower extremity [L03.116] 09/14/2023    Angular cheilitis [K13.0] 09/14/2023    Odynophagia [R13.10] 09/14/2023    Alcohol withdrawal (CMS-HCC) [F10.939] 02/01/2020    Chronic hepatitis C without hepatic coma (CMS-HCC) [B18.2] 05/17/2017    HIV infection [B20] 03/07/2017    Polysubstance abuse (CMS-HCC) [F19.10] 12/11/2013      Resolved Hospital Problems   No resolved problems to display.     LLE cellulitis: Patient was started on vancomycin and cefepime. Imaging ruled out DVT, myositis, and abscess. Significant improvement on ABX and was transitioned to PO Augmentin and doxycycline for a total 10 day treatment course.     Odynophagia, likely candidal esophagitis: Patient with significant odynophagia and reported weight loss. EGD notable for esophageal plaques suspicious for candidal esophagitis, also with moderately severe esophagitis. Started on fluconazole x 21 days and daily PPI. Nystatin mouthwash provided for comfort    EtOH Use disorder: Maintained on scheduled valium and CIWA protocol. No seizures or other findings of severe withdrawal. Weaned off of benzos prior to discharge. Discharged on acamprosate.    SUD: Fentanyl use outpatient. Started on methadone (reduced dose of 50mg  due to interaction with fluconazole). Patient to present for methadone clinic intake tomorrow.    HIV: Patient off of  HAART outpatient. HIV VL undetectable and CD4 count 295 this admission. Restarted on Triumeq by infectious disease.    Low back pain: MRI obtained to rule out osteomyelitis. Notable for small indeterminate enhancing foci involving L2 and L3 which were too small to characterize, no evidence of discitis or epidural abscess at lumbar levels, non-compressive degenerate disc disease at L4-5. Topical lidocaine prescribed for symptom control    Condition on Discharge     1. Functional Status: normal   Describe limitations, if any: none    2. Mental Status: Alert/Oriented   Describe limitations, if any: none    3. Dietary Restrictions / Tube Feeding / TPN  Diet/Nutrition Orders    Diet Regular(7)     Frequency: Effective Now     Number of Occurrences: Until Specified     Order Questions:      Suicide/Behavior Risk Modification? No     As listed above    4. Discharge specific orders:   None required    5. Core measures followed: (if this is a core measure patient)  Discharge Weight: 117 lb (53.1 kg)          Is patient dx ACS? No    Disposition     Home independent      Follow-Up Appointments     Future Appointments   Date Time Provider Department Center   10/04/2023  9:30 AM Mohamed Abdelrahman Arliss Journey, MD [facility]  [facility]     No follow-up provider specified.    Signed:    Blair Heys, MD  09/18/2023, 2:46 PM

## 2023-09-18 NOTE — Progress Notes (Signed)
Occupational Therapy  Initial Assessment and Discharge     Name: Alexander [name]  DOB: [birth year]  Attending Physician: Emmie Niemann*  Admission Diagnosis: HIV disease (CMS-HCC) [B20]  Cellulitis of left lower extremity [L03.116]  HIV infection, unspecified symptom status (CMS-HCC) [Z21]  Date: 09/18/2023  Room: 0981/X9147  Reviewed Pertinent hospital course: Yes    Hospital Course PT/OT: 37 yo male presents with worsening L LE swelling over last 4 days consistent with cellulitis.  L LE doppler: neg. Abd [place] - cholelithiasis, trace ascites, splenomegaly.  CT L calf - findings consistent with diffuse cellulitis. 10/11: MRI L-spine - Small indeterminate enhancing foci involving L2 and L3 - differential diagnosis atypical benign venous malformation, infectious lesion - less likely, no evidence of discitis or epidural abscess, DDD L4-5.  GI c/s for dysphagia/odynophagia, recommend EGD.  10/11: EGD - Esophageal plaques were found, suspicious for candidiasis, Moderately severe esophagitis - Biopsied, Erythematous mucosa in the stomach - Biopsied, non-bleeding duodenal ulcer  Relevant PMH : HIV, Hep C, IVDU, liver dz, SUD, bipolar, anxiety, active fentanyl use  Precautions: none  Activity Level: Activity as tolerated    Assist: Co-evaluation performed      Recommendation  Recommendation: No skilled OT  Equipment Recommendations: None      Assessment/Goals/Plan  Pt with no skilled acute occupational therapy needs, therefore no occupational therapy goals were established. Discharge patient from inpatient occupational therapy.       Pt stated goal to go home.      Outcome Measures  AM-PAC 6 Clicks Daily Activity Inpatient Short Form: OT 6 Clicks Score: 24    Home Living/Prior Function  Patient able to provide accurate information at this time: Yes  Type of Home: Homeless    Prior Function  Functional Mobility: Independent ( no assistive device)  ADL Assistance: Independent     Pain  Pain Score: 6   Pain Location:  Back  Pain Descriptors: Throbbing  Pain Intervention(s): Ambulation/increased activity;Repositioned  Therapist reported pain to: RN    Cognition  Overall Cognitive Status: Within Functional Limits  Cognitive Assessment: Arousal/ Alertness;Orientation Level;Behavior;Following Commands;Safety Judgment;Insight;Memory  Arousal/Alertness: Alert  Orientation Level: Oriented X4  Behavior: Appropriate;Cooperative  Following Commands: Follows all commands and directions without difficulty  Safety Judgment: Good awareness of safety precautions  Insight: Demonstrated intact insight into limitation and abilities to complete ADL's safely    Vision  Hearing: No hearing deficits noted  Baseline Vision: Wears glasses all the time  Overall Vision/ Perception: Within Functional Limits       Right Upper Extremity   Right UE ROM: Grossly WFL as observed during functional activities  Right UE Strength: Grossly WFL (at least 3+/5) as observed during functional activities  Right UE Muscle Tone: Normal  Right Hand Function: Grossly WFL as observed during functional activity       Left Upper Extremity  Left UE ROM: Grossly WFL as observed during functional activities  Left UE Strength: Grossly WFL (at least 3+/5) as observed during functional activities  Left UE Muscle Tone: Normal  Left UE Hand Function: Grossly WFL as observed during functional activites       Neuromuscular  Overall Sensation: Within Functional Limits (sensation intact to light touch B LE)          Functional Mobility  Bed Mobility   Supine to Sit: Independent  Sit to Supine: Independent  Transfers  Sit to Stand: Independent  Stand to Sit: Independent  Functional Mobility: Supervision  Balance  Sitting -  Static: Independent  Sitting-Dynamic: Independent   Standing-Static: Independent  Standing-Dynamic: Supervision    Gait belt used: Yes    ADL  Grooming: Independent (to brush teeth)  Location Assessed Grooming: Standing at sink  Upper Body Dressing : Independent (exchange  gown)  Location Assessed UE Dressing: Seated edge of bed  Lower Body Dressing: Independent (to don pants)  Location Assessed LE Dressing: Sitting at sink;Seated edge of bed         Position after Treatment/Safety Handoff  Position after therapy session: Bed  Details: RN notified;Call light/ needs within reach  Alarms: Bed  Alarms Status: Activated and Interfaced with call system    Plan  Plan  OT Frequency: One-time visit--discharge from OT    The plan of care and recommendations assesses the patient's and/or caregiver's readiness, willingness, and ability to provide or support functional mobility and ADL tasks as needed upon discharge.    Goals       Patient/Family Education  Educated patient on the role of occupational therapy, discharge recommendation, and the importance of safety and fall prevention strategies including need for supervision/ assistance with OOB activity and use of call light. patient  verbalized understanding.    OT Time  Start Time: 1134  Stop Time: 1152  Time Calculation (min): 18 min    OT Charges  $OT Evaluation Mod Complex 45 Min: 1 Procedure              Problem List  Patient Active Problem List   Diagnosis    Foreign body in cornea    Other, mixed, or unspecified nondependent drug abuse, unspecified (CMS-HCC)    Drug-induced mood disorder (CMS-HCC)    Bipolar affective disorder, depressed (CMS-HCC)    Severe bipolar I disorder, current or most recent episode depressed (CMS-HCC)    Mood disorder (CMS-HCC)    Bipolar I disorder, most recent episode depressed (CMS-HCC)    Tylenol overdose    Bipolar disorder (CMS-HCC)    Alcohol abuse    Polysubstance abuse (CMS-HCC)    Persistent mood disorder (CMS-HCC)    HIV infection    Chronic hepatitis C without hepatic coma (CMS-HCC)    Suicidal ideation    Elevated liver enzymes    Peripheral polyneuropathy    Hematospermia    Alcohol withdrawal (CMS-HCC)    Cellulitis    Opioid use disorder    Cough with hemoptysis    Transaminitis    Cellulitis of  left lower extremity    Angular cheilitis    Odynophagia      Past Medical History  Past Medical History:   Diagnosis Date    Anxiety     Bipolar disorder (CMS-HCC)     Bronchitis     Depression     Drug abuse and dependence (CMS-HCC)     H/O tooth extraction     Hepatitis C     HIV infection (CMS-HCC)     Liver disease Hep C    PTSD (post-traumatic stress disorder)     Substance induced mood disorder (CMS-HCC)     Thyroid disease      Past Surgical History  Past Surgical History:   Procedure Laterality Date    ESOPHAGOGASTRODUODENOSCOPY N/A 09/15/2023    Procedure: EGD WITH BIOPSY;  Surgeon: Royetta Asal, MD;  Location: [facility];  Service: Gastroenterology;  Laterality: N/A;    MANDIBLE SURGERY      OPEN REDUCTION INTERNAL FIXATION MANDIBLE Bilateral 01/27/2021    Procedure: OPEN REDUCTION INTERNAL FIXATION MANDIBLE,  MAXILLOMANDIBULAR FIXATION;  Surgeon: Ellery Plunk, MD;  Location: [facility];  Service: ENT;  Laterality: Bilateral;

## 2023-09-18 NOTE — Nursing Note (Signed)
Discharge medications delivered to bedside. Patient discharged to home. PIV x2 removed. Discharge papers discussed with patient. Patient belongings with patient. Patient stable upon leaving unit.

## 2023-09-18 NOTE — Care Coordination-Inpatient (Signed)
METHADONE PATIENTS    SUD Diagnoses: OUD   Barriers to Linkage to Care:   Pt unhoused at this time.   Anticipated Discharge date and location Anticipated discharger date:  today  Discharge location: n/a   Pharmacotherapy Recommendations: New methadone   Methadone Clinic Linkage Select one: UC-OTP     Methadone Clinic Notes X  New methadone patient - they must establish care by walking into [facility]OT[facility] clinic at [facility], Monday - Thursday between 6am - 9am.  Patient cannot be discharged on a Friday-Saturday, due to the need to establish care at a methadone clinic.     **Existing methadone patient - patient cannot be discharged on a Saturday, as their methadone clinic is closed Sunday.     Patient is going to a SNF, primary team to ensure that SNF can take the patient to the methadone clinic the day after discharge to establish care    X   It is illegal to write a prescription for methadone for OUD treatment, it must come from an OTP,    Addiction Consult Team SW and Peer notes:   Pt agreeable to plan to come to [facility] for intake tomorrow during walk in hours.    [facility] INFORMATION  ADDICTION SCIENCES DIVISION (ASD) CLINICAL SERVICES  Medical Office Building - Caffie Damme  [address] [place], [place] [address]  Contact number: [phone]    Overview:  We provide comprehensive, evidence-based, outpatient substance use disorder treatment to adults (>=71 years of age).     To establish care:  Most outpatients can be seen on a same-day basis through the open access assessment model. No appointment is necessary.    Opioid Treatment Program (Methadone Clinic) Open Access:  Monday - Thursday --must arrive by 9AM to [address] at [address] [facility]  Patients must have a valid photo ID.  3 patients accepted per day (EXCEPT FRIDAYS).  Patients will be asked to complete an observed drug test during the assessment.   Assessment could take up to 3-4 hours.  After completing the full assessment, patients will receive methadone  that day if they are accepted to the program.  Insurances that are currently accepted: [address] Medicaid, [place], Public affairs consultant.  Cost is $20 per day without insurance          Primary Team Social Work and Case Management Team to complete N/a   Harm Reduction Discharge Resources If a patient leaves prematurely, we recommend intake at [facility].   Care Coordination Needs prior to discharge Please ensure methadone dose is listed on discharge summary medication rec.

## 2023-09-18 NOTE — Plan of Care (Signed)
Problem: Patient will remain free of injury d/t fall  Goal: High Fall Risk Precautions  Outcome: Progressing     Problem: Incontinence  Goal: Perineal skin integrity is maintained or improved  Description: Assess genitourinary system, perineal skin, labs (urinalysis), and history of incontinence to include past management, aggravating, and alleviating factors.  Collaborate with interdisciplinary team and initiate plans and interventions as needed.  Outcome: Progressing     Problem: Acute Pain  Description: Patient's pain progressing toward patient's stated pain goal  Goal: Patient displays improved well-being such as baseline levels for pulse, BP, respirations and relaxed muscle tone or body posture  Outcome: Progressing

## 2023-09-18 NOTE — Discharge Instructions (Signed)
Subspecialty Follow-up Appointment Chart Note    GI/Hepatology Procedure Request    This patient will require a procedure after discharge.   We will arrange for the following:     Procedure: EGD    Facility: [facility]  Diagnosis/Indication:  Esophagitis  Urgent or Routine: routine  Expected Date/Time-Frame: 8 weeks  Referring Provider: Genice Kimberlin  Prep: none  Med Changes (e.g. blood thinners, GLP-1, etc): none    Repeat EGD in 8 weeks to assess healing of esophagitis     Jolyn Lent, MD  09/19/2023  7:03 AM

## 2023-09-18 NOTE — Care Coordination-Inpatient (Addendum)
[place]  Case Management/Social Work Department  Progress Note    Patient Information     Patient Name: Alexander [name]  MRN: [id number]  Hospital day: 4  Inpatient/Observation:  Inpatient   Level of Care:  medicine   Admit date:  09/13/2023  Admission diagnosis: HIV disease (CMS-HCC) [B20]  Cellulitis of left lower extremity [L03.116]  HIV infection, unspecified symptom status (CMS-HCC) [Z21]    PMH:  has a past medical history of Anxiety, Bipolar disorder (CMS-HCC), Bronchitis, Depression, Drug abuse and dependence (CMS-HCC), H/O tooth extraction, Hepatitis C, HIV infection (CMS-HCC), Liver disease (Hep C), PTSD (post-traumatic stress disorder), Substance induced mood disorder (CMS-HCC), and Thyroid disease.    PCP:  Herbert Pun, MD    Home Pharmacy:    [facility] [id number] - [address], [place] - [address] AT [facility] [address]  [address]  [place] [address]  Phone: [phone]     [facility]  [address] [place] [address]  Phone: [phone]     [facility] [place], [place] - [address]  [address]  [place] [address]  Phone: [phone]     [facility] [id number] [place], [place] - [address] AT [facility] [address]  [address]  [place] [address]  Phone: [phone]     [facility]  [address]  [address] [place] [address]  Phone: [phone]     [facility] [id number] [place], [place] - [address]  [address] [place] [address]  Phone: [phone]     [facility]  [address]  [place] [address]  Phone: [phone]         Medical Insurance Coverage:  Payor: Luther Redo / Plan: MOLINA / Product Type: Medicaid [place] care /     Other Pertinent Information     SW attended daily medical huddle and per team, patient is medically ready to discharge.    SW met with patient at bedside and he declined shelter resources stating he left a dog with a friend and shelters do not take pets.     He stated he has worked with street outreach with [facility] and will get his apartment end of this month.       Patient also declined [facility] as he wants to return to his dog; accepted sweatshirt/sweatpants and was appreciative.     SW confirmed LNOK as his mother and his sister as another contact; he is unsure of their new phone numbers and unsure if the numbers listed on facesheet work.    Discharge Plan     Anticipated discharge plan:  Homeless     Anticipated discharge date:  09/18/2023    CM/SW will continue to follow and remain available for discharge planning needs.      Hardie Pulley, MSW, [place]     [phone]

## 2023-09-18 NOTE — Discharge Instructions (Addendum)
Alexander [name],  Here are your hospital discharge instructions:    You were admitted to the hospital because of a foot infection. While you were here, you were also evaluated for your trouble swallowing and treated for your withdrawal from alcohol and fentanyl.    For your foot:  -You had a bacterial infection but no sign of deeper infection in the muscle or bone. You will take two antibiotics when you go home: Augmentin and Doxycycline. Take these twice a day for the next 5 days. Take a dose of each starting tonight    For your swallowing:   -Your throat had some sore spots likely caused by a fungus called candida. We started fluconazole for this. Take fluconazole daily for the next 17 days  -We are also sending you home with an acid blocking medication called pantoprazole. Take this in the morning on an empty stomach.    For your withdrawal:  -You were started on methadone. Your methadone dose in the hospital was 50mg  and you last received this dose 10/14. You will need to go to [facility] clinic tomorrow for methadone clinic intake  -For your alcohol use, we are sending you home with a medication called acamprosate that you can take 3 times a day to help with cravings.    For your HIV:  -Your viral load was undetectable and your CD4 number was still above 250. We restarted your Triumeq. It is important to keep taking this and to follow-up with the infectious disease doctors    --> New medications:   1) Acamprosate 666 mg by mouth, one pill every 8 hours (three times a day total) for alcohol cessation   2) Methadone 50 mg by mouth daily for opioid use disorder.   3) Fluconazole 100 mg by mouth daily for the infection in your esophagus   4) Augmentin 875-125 mg, one pill every 12 hours (two times a day total) for your leg infection   5) Doxycycline 100 mg, one pill every 12 hours (two times a day total) for your leg infection   6) Lidocaine patch, one patch every 24 hours total for your back pain   7) Nicotine patches  and gum as needed for smoking cravings if you would like to use them    --> Other Instructions:  1) Please attend the [facility] clinic tomorrow morning between 6am-10am to complete your intake. Please bring your discharge summary to this appointment.  2) Please attend the [facility] clinic to follow up with your primary care provider within 1-2 weeks of leaving the hospital.    Thank you, Primary Medicine Team    Future Appointments   Date Time Provider Department Center   10/04/2023  9:30 AM [facility] Arliss Journey, MD [facility]       Recent Lab Values for you and your doctors:  No results for input(s): TROPONINI, CKMB, BNP in the last 72 hours.  Recent Labs     09/16/23  0713 09/17/23  0547 09/18/23  0658   NA 127* 129* 130*   K 4.7 4.4 3.9   CL 97* 98 96*   CO2 23 28 27    BUN 5* 8 8   CREATININE 0.48* 0.59* 0.52*   GLUCOSE 90 98 110*   CALCIUM 8.3* 8.1* 8.1*   MG 1.7 1.7 1.5   PHOS 3.6 3.6 3.2     Recent Labs     09/16/23  0713 09/17/23  0547 09/18/23  6045  WBC 5.8 5.5 4.5   HGB 12.4* 11.8* 11.0*   HCT 37.5* 35.0* 32.9*   PLT 74* 84* 66*   INR 1.3*  --   --    PROTIME 16.7*  --   --      Recent Labs     09/16/23  0713   AST 111*   ALT 42   ALKPHOS 139*   BILITOT 1.2   BILIDIRECT 0.57*     No results for input(s): CHOLTOT, TRIG, HDL, CHOLHDL, LDL in the last 72 hours.    Invalid input(s): VLDCHOL  No results for input(s): VITAMINB1, FOLATE in the last 72 hours.

## 2023-09-18 NOTE — Other (Signed)
[place]    Case Manager/Social Worker Discharge Summary     Patient name: OMERE CARROLL                                        Patient MRN: [id number]  DOB: 03-Sep-1986                              Age: 37 y.o.              Gender: male  Patient emergency contact: Extended Emergency Contact Information  Primary Emergency Contact: Sueanne Margarita  Address: No address given   [place]  Home Phone: [phone]  Mobile Phone: [phone]  Relation: Sister  Preferred language: English  Interpreter needed? No  Secondary Emergency Contact: Lubke,[name]  Address: no address given   [place]  Home Phone: [phone]  Relation: Mother  Preferred language: English  Interpreter needed? No      Attending provider: Emmie Niemann*  Primary care physician: Herbert Pun, MD    The MD has indicated that the patient is ready for discharge.  RATANA FLOOD was Discharged with no needs.  The patient will be transported by  metro bus. SW provided 2 bus passes to get to where he resides outside.  Transfer Mode/Level of Care: [employer]         DC Summary and COC have been faxed to facility. N/A    The plan has been reviewed:     Patient/Family Informed of Discharge Plan: Yes    Plan Reviewed With Patient, Family, or Significant Other: Yes    Patient and or family are aware and in agreement with the discharge plan: Yes             Plan reviewed with MD and other members of the health care team: Yes  Care Plan Completed: Yes    No further CM/SW needs.    This plan has been reviewed with the multi-disciplinary team.     Treatment Preferences  distance       Post-Discharge Goals  Improve health        Post Acute Care Provider Information:           Community Services at Discharge  Community Services at Home post discharge: Not Applicable         Elliot Gault MSW LSW  [phone]

## 2023-09-18 NOTE — Progress Notes (Signed)
The following clinical information related to substance use is CONFIDENTIAL and protected by [employer]. ACCESS TO THIS INFORMATION IS ON A NEED-TO-KNOW BASIS ONLY AND IS PROVIDED FOR THE PURPOSE OF ASSURING APPROPRIATE MEDICAL CARE. Federal regulations (42 CFR, Part 2) prohibit the release of this information without specific written consent of the patient. A general authorization for the release of medical information is NOT sufficient for the purpose of releasing the following information.     Addiction Consult plan of care note       Addiction Medicine has been consulted to help with Alexander [name] 's care. Reviewed the chart, medication list, Lab and urine testing.    Due to a busy service I will be unable to see the patient today  NEW methadone start at [facility]OT[facility]      Recommendation as following :   -consider medical respite for discharge  -if patient has opioid CRAVINGS, give another methadone 10mg  1x and increase to methadone 50mg  daily  -New methadone start: establish care at [facility] (has been here bfore)  -on day of discharge, make sure med rec has most updated methadone dose, day of last dose in comments, and that it is NOT prescribed (click no print). If this is not on med rec, the methadone clinic will not be able to provide the patient an accurate dose placing them at risk of opioid withdrawal.       -primary team to offer acamprosate 666mg  TID to help with AUD cessation  -IN narcan on discharge      Please reach out if need additional help/resources .    Len Blalock, MD  Addiction Medicine/ Internist   Behavioral and Addiction sciences Department   [facility].

## 2023-09-18 NOTE — Plan of Care (Signed)
Problem: Patient will remain free of injury d/t fall  Goal: High Fall Risk Precautions  Outcome: Progressing     Problem: Potential for Compromised Skin Integrity  Goal: Skin integrity is maintained or improved  Description: Assess and monitor skin integrity. Identify patients at risk for skin breakdown on admission and per policy. Collaborate with interdisciplinary team and initiate plans and interventions as needed.  Outcome: Progressing     Problem: Acute Pain  Description: Patient's pain progressing toward patient's stated pain goal  Goal: Patient displays improved well-being such as baseline levels for pulse, BP, respirations and relaxed muscle tone or body posture  Outcome: Progressing

## 2023-09-18 NOTE — Plan of Care (Signed)
Problem: Patient will remain free of injury d/t fall  Goal: High Fall Risk Precautions  Outcome: Progressing     Problem: Knowledge Deficit  Goal: Patient/family/caregiver demonstrates understanding of disease process, treatment plan, medications, and discharge instructions  Description: Complete learning assessment and assess knowledge base.  Outcome: Progressing     Problem: Potential for Compromised Skin Integrity  Goal: Skin integrity is maintained or improved  Description: Assess and monitor skin integrity. Identify patients at risk for skin breakdown on admission and per policy. Collaborate with interdisciplinary team and initiate plans and interventions as needed.  Outcome: Progressing

## 2023-10-01 DIAGNOSIS — L03119 Cellulitis of unspecified part of limb: Secondary | ICD-10-CM

## 2023-10-01 DIAGNOSIS — L03116 Cellulitis of left lower limb: Secondary | ICD-10-CM

## 2023-10-01 NOTE — ED Triage Notes (Signed)
Pt roomed in E [facility] via squad for worsening BLE swelling, pain, and infection. Pt states worsening issues post mechanical fall 09/30/2023 injuring. BLE redden and warm. Pt states recent hospitalization for cellulitis. BUE notable for IVDU. Pt full mentating.

## 2023-10-01 NOTE — ED Provider Notes (Incomplete)
[facility] ED Note    Date of Service: 10/01/2023  Reason for Visit: Leg Pain and Wound Check      Patient History     HPI  Alexander [name] is a 37 y.o. male with a history of HIV, HVC, SUD, EtOH use disorder with prior severe withdrawal     Finished abx with couple missed days   LLE swelling again  Tripped  R leg looks swollen now    Past Medical History:   Diagnosis Date    Anxiety     Bipolar disorder (CMS-HCC)     Bronchitis     Depression     Drug abuse and dependence (CMS-HCC)     H/O tooth extraction     Hepatitis C     HIV infection (CMS-HCC)     Liver disease Hep C    PTSD (post-traumatic stress disorder)     Substance induced mood disorder (CMS-HCC)     Thyroid disease      Past Surgical History:   Procedure Laterality Date    ESOPHAGOGASTRODUODENOSCOPY N/A 09/15/2023    Procedure: EGD WITH BIOPSY;  Surgeon: Royetta Asal, MD;  [facility];  Service: Gastroenterology;  Laterality: N/A;    MANDIBLE SURGERY      OPEN REDUCTION INTERNAL FIXATION MANDIBLE Bilateral 01/27/2021    Procedure: OPEN REDUCTION INTERNAL FIXATION MANDIBLE, MAXILLOMANDIBULAR FIXATION;  Surgeon: Ellery Plunk, MD;  Location: [facility] OR;  Service: ENT;  Laterality: Bilateral;       Physical Exam     There were no vitals filed for this visit.    General:  Well appearing. No acute distress, resting comfortably.***  Eyes:  Pupils reactive. EOMI. No discharge from eyes.***  ENT: No oropharyngeal edema, tolerating oral secretions***  Pulmonary:   Non-labored breathing. Breath sounds clear bilaterally. ***  Cardiac:  ***Regular rate and rhythm. ***  Abdomen:  Soft. Non-distended. Non-tender. ***  Musculoskeletal:  No long bone deformity. No peripheral edema***  Skin:  Dry, no rashes***  Neuro:  Alert and oriented x4. CN II-XII intact. Moves all four extremities to command. ***    Diagnostic Studies     Labs:  Please see EMR for labs obtained during this patient encounter     Radiology:  Please see EMR  for images obtained during this patient encounter     EKG Interpretation:    Rhythm: {CARDIAC MONITOR STRIP RHYTHM:24852}  Rate: {EKG RATE:24853}  Axis: {EKG AXIS:24859}  Ectopy: {EKG ECTOPY:24854}  Conduction: {EKG CONDUCTION:24855}  ST Segments: {EKG ST SEGMENTS:24857}  T Waves: {EKG T ZOXWR:60454}  Q Waves: {EKG LEADS 2:24856}    Clinical Impression: {EKG CLINICAL IMPRESSION:24860}  Comparison:    ED Course and MDM     Alexander [name] is a 37 y.o. male with a history and presentation as described above in HPI.  The patient was evaluated by myself, the ED Attending Physician ***, and R4 ***. All management and disposition plans were discussed and agreed upon.         Medications received during this ED visit:  Medications - No data to display      Medical Decision Making       Impression     No diagnosis found.     Plan     Discharge, see MDM and discharge instructions provided***    Admit to *** under *** status for ongoing management of ***     At this time, the patient has been medically cleared for transfer to  [facility] for further psychiatric evaluation. Handoff completed with Dr. Marland Kitchen and the patient is accepted at [facility]. EMTALA documentation completed. ***    At this time, I will be going off service and have signed out the patient to my oncoming colleague. Responsibilities and follow-up will include:  - ***  - ***  - ***      Wonda Olds, MD, MD  [facility]

## 2023-10-01 NOTE — ED Triage Notes (Signed)
Pt comes to the ED with reported worsening cellulitis in his left lower leg. Pt was admitted and recently discharged with antibiotics. Pt states he has skipped a couple doses of his antibiotics but reports he took his antibiotics today. Pt also reports he fell yesterday and hit left knee and now it is swollen. Pt's skin on his left leg is warm to the touch, swollen, and red. VSS per EMS.

## 2023-10-02 ENCOUNTER — Emergency Department: Admit: 2023-10-02 | Payer: PRIVATE HEALTH INSURANCE

## 2023-10-02 ENCOUNTER — Inpatient Hospital Stay
Admission: EM | Admit: 2023-10-02 | Discharge: 2023-10-04 | Disposition: A | Discharge status: Home / Self Care | Payer: PRIVATE HEALTH INSURANCE

## 2023-10-02 DIAGNOSIS — L03116 Cellulitis of left lower limb: Secondary | ICD-10-CM

## 2023-10-02 LAB — DIFFERENTIAL
Basophils Absolute: 38 /uL (ref 0–200)
Basophils Relative: 0.6 % (ref 0.0–1.0)
Eosinophils Absolute: 44 /uL (ref 15–500)
Eosinophils Relative: 0.7 % (ref 0.0–8.0)
Lymphocytes Absolute: 851 /uL (ref 850–3900)
Lymphocytes Relative: 13.5 % — ABNORMAL LOW (ref 15.0–45.0)
Monocytes Absolute: 630 /uL (ref 200–950)
Monocytes Relative: 10 % (ref 0.0–12.0)
Neutrophils Absolute: 4738 /uL (ref 1500–7800)
Neutrophils Relative: 75.2 % (ref 40.0–80.0)
nRBC: 0 /100{WBCs} (ref 0–0)

## 2023-10-02 LAB — CBC
Hematocrit: 33 % — ABNORMAL LOW (ref 38.5–50.0)
Hemoglobin: 10.9 g/dL — ABNORMAL LOW (ref 13.2–17.1)
MCH: 26.8 pg — ABNORMAL LOW (ref 27.0–33.0)
MCHC: 33.2 g/dL (ref 32.0–36.0)
MCV: 80.9 fL (ref 80.0–100.0)
MPV: 9.6 fL (ref 7.5–11.5)
Platelets: 94 10*3/uL — ABNORMAL LOW (ref 140–400)
RBC: 4.07 10*6/uL — ABNORMAL LOW (ref 4.20–5.80)
RDW: 15.4 % — ABNORMAL HIGH (ref 11.0–15.0)
WBC: 6.3 10*3/uL (ref 3.8–10.8)

## 2023-10-02 LAB — RENAL FUNCTION PANEL W/EGFR
Albumin: 2.8 g/dL — ABNORMAL LOW (ref 3.5–5.7)
Anion Gap: 5 mmol/L (ref 3–16)
BUN: 5 mg/dL — ABNORMAL LOW (ref 7–25)
CO2: 25 mmol/L (ref 21–33)
Calcium: 8.2 mg/dL — ABNORMAL LOW (ref 8.6–10.3)
Chloride: 102 mmol/L (ref 98–110)
Creatinine: 0.48 mg/dL — ABNORMAL LOW (ref 0.60–1.30)
EGFR: >90
Glucose: 99 mg/dL (ref 70–100)
Osmolality, Calculated: 271 mosm/kg — ABNORMAL LOW (ref 278–305)
Phosphorus: 3.4 mg/dL (ref 2.1–4.7)
Potassium: 4 mmol/L (ref 3.5–5.3)
Sodium: 132 mmol/L — ABNORMAL LOW (ref 133–146)

## 2023-10-02 LAB — HEPATIC FUNCTION PANEL
ALT: 48 U/L (ref 7–52)
AST: 104 U/L — ABNORMAL HIGH (ref 13–39)
Albumin: 3.1 g/dL — ABNORMAL LOW (ref 3.5–5.7)
Alkaline Phosphatase: 143 U/L — ABNORMAL HIGH (ref 36–125)
Bilirubin, Direct: 0.6 mg/dL — ABNORMAL HIGH (ref 0.0–0.4)
Bilirubin, Indirect: 0.4 mg/dL (ref 0.0–1.1)
Total Bilirubin: 0.9 mg/dL (ref 0.0–1.5)
Total Protein: 8.2 g/dL (ref 6.4–8.9)

## 2023-10-02 LAB — BASIC METABOLIC PANEL
Anion Gap: 7 mmol/L (ref 3–16)
BUN: 7 mg/dL (ref 7–25)
CO2: 26 mmol/L (ref 21–33)
Calcium: 8.6 mg/dL (ref 8.6–10.3)
Chloride: 99 mmol/L (ref 98–110)
Creatinine: 0.57 mg/dL — ABNORMAL LOW (ref 0.60–1.30)
EGFR: >90
Glucose: 113 mg/dL — ABNORMAL HIGH (ref 70–100)
Osmolality, Calculated: 273 mosm/kg — ABNORMAL LOW (ref 278–305)
Potassium: 3.5 mmol/L (ref 3.5–5.3)
Sodium: 132 mmol/L — ABNORMAL LOW (ref 133–146)

## 2023-10-02 LAB — HIGH SENSITIVITY TROPONIN
High Sensitivity Troponin: 5 ng/L (ref 0–20)
High Sensitivity Troponin: 6 ng/L (ref 0–20)

## 2023-10-02 LAB — BLOOD CULTURE-PERIPHERAL
Culture Result: NO GROWTH
Culture Result: NO GROWTH

## 2023-10-02 LAB — SED RATE: Sed Rate: 70 mm/h — ABNORMAL HIGH (ref 0–15)

## 2023-10-02 LAB — CK: Total CK: 48 U/L (ref 30–223)

## 2023-10-02 LAB — C-REACTIVE PROTEIN: CRP: 12.1 mg/L — ABNORMAL HIGH (ref 1.0–10.0)

## 2023-10-02 MED ORDER — pantoprazole (PROTONIX) EC tablet 40 mg
40 | Freq: Every morning | ORAL | Status: AC
Start: 2023-10-02 — End: 2023-10-04
  Administered 2023-10-03 – 2023-10-04 (×2): via ORAL

## 2023-10-02 MED ORDER — amLODIPine (NORVASC) tablet 5 mg
5 | Freq: Every day | ORAL | Status: AC
Start: 2023-10-02 — End: 2023-10-04
  Administered 2023-10-02 – 2023-10-04 (×3): via ORAL

## 2023-10-02 MED ORDER — methadone (DOLOPHINE) 10 mg/mL oral concentrate 40 mg
10 | Freq: Once | ORAL | Status: AC | PRN
Start: 2023-10-02 — End: 2023-10-02
  Administered 2023-10-02: 17:00:00 via ORAL

## 2023-10-02 MED ORDER — electrolyte-R (pH 7.4) (NORMOSOL-R pH 7.4) IV solution
INTRAVENOUS | Status: AC
Start: 2023-10-02 — End: 2023-10-02
  Administered 2023-10-02: 23:00:00 via INTRAVENOUS

## 2023-10-02 MED ORDER — enoxaparin (LOVENOX) syringe 40 mg/0.4 mL
40 | Freq: Every day | SUBCUTANEOUS | Status: AC
Start: 2023-10-02 — End: 2023-10-04
  Administered 2023-10-02 – 2023-10-04 (×3): via SUBCUTANEOUS

## 2023-10-02 MED ORDER — glucose chewable tablet 12 g
4 | ORAL | Status: AC | PRN
Start: 2023-10-02 — End: 2023-10-04

## 2023-10-02 MED ORDER — ketorolac (TORADOL) injection 15 mg
15 | Freq: Once | INTRAMUSCULAR | Status: AC
Start: 2023-10-02 — End: 2023-10-02
  Administered 2023-10-02: 06:00:00 via INTRAVENOUS

## 2023-10-02 MED ORDER — lidocaine (LIDODERM) 5 % 1 patch
5 | Freq: Every day | TOPICAL | Status: AC
Start: 2023-10-02 — End: 2023-10-04
  Administered 2023-10-02 – 2023-10-04 (×3): via TRANSDERMAL

## 2023-10-02 MED ORDER — ibuprofen (MOTRIN) tablet 400 mg
400 | Freq: Four times a day (QID) | ORAL | Status: AC | PRN
Start: 2023-10-02 — End: 2023-10-03
  Administered 2023-10-03: 13:00:00 via ORAL

## 2023-10-02 MED ORDER — acetaminophen (TYLENOL) tablet 650 mg
325 | Freq: Four times a day (QID) | ORAL
Start: 2023-10-02 — End: 2023-10-02

## 2023-10-02 MED ORDER — nicotine (NICODERM CQ) 21 mg/24 hr 1 patch
21 | Freq: Every day | TRANSDERMAL | Status: AC
Start: 2023-10-02 — End: 2023-10-04
  Administered 2023-10-02 – 2023-10-04 (×3): via TRANSDERMAL

## 2023-10-02 MED ORDER — loperamide (IMODIUM) capsule 2 mg
2 | Freq: Four times a day (QID) | ORAL | Status: AC | PRN
Start: 2023-10-02 — End: 2023-10-03

## 2023-10-02 MED ORDER — vancomycin (VANCOCIN) 1,250 mg in sodium chloride 0.9% 250 mL ADDaptor IVPB
Freq: Once | INTRAVENOUS | Status: AC
Start: 2023-10-02 — End: 2023-10-02
  Administered 2023-10-02: 07:00:00 via INTRAVENOUS

## 2023-10-02 MED ORDER — cloNIDine HCL (CATAPRES) tablet 0.1 mg
0.1 | Freq: Two times a day (BID) | ORAL | PRN
Start: 2023-10-02 — End: 2023-10-02
  Administered 2023-10-02: 17:00:00 via ORAL

## 2023-10-02 MED ORDER — methadone (DOLOPHINE) 10 mg/mL oral concentrate 50 mg
10 | Freq: Every day | ORAL | Status: AC
Start: 2023-10-02 — End: 2023-10-03
  Administered 2023-10-03: 13:00:00 via ORAL

## 2023-10-02 MED ORDER — dextrose 10%-water (D10W) IV soln
INTRAVENOUS | Status: AC | PRN
Start: 2023-10-02 — End: 2023-10-04

## 2023-10-02 MED ORDER — cefepime (MAXIPIME) 2 g in sodium chloride 0.9% 100 mL IVPB ADDaptor
Freq: Two times a day (BID) | INTRAVENOUS
Start: 2023-10-02 — End: 2023-10-02

## 2023-10-02 MED ORDER — HYDROmorphone (DILAUDID) injection 0.5 mg
0.5 | Freq: Once | INTRAMUSCULAR | Status: AC
Start: 2023-10-02 — End: 2023-10-02
  Administered 2023-10-02: 06:00:00 via INTRAVENOUS

## 2023-10-02 MED ORDER — cloNIDine HCL (CATAPRES) tablet 0.1 mg
0.2 | Freq: Four times a day (QID) | ORAL | Status: AC
Start: 2023-10-02 — End: 2023-10-04
  Administered 2023-10-02 – 2023-10-04 (×8): via ORAL

## 2023-10-02 MED ORDER — methadone (DOLOPHINE) 10 mg/mL oral concentrate 10 mg
10 | ORAL | Status: AC | PRN
Start: 2023-10-02 — End: 2023-10-02
  Administered 2023-10-02 – 2023-10-03 (×2): via ORAL

## 2023-10-02 MED ORDER — methadone (DOLOPHINE) 10 mg/mL oral concentrate 5 mg
10 | ORAL | PRN
Start: 2023-10-02 — End: 2023-10-02

## 2023-10-02 MED ORDER — ondansetron (ZOFRAN) injection 4 mg
4 | Freq: Three times a day (TID) | INTRAMUSCULAR | PRN
Start: 2023-10-02 — End: 2023-10-02

## 2023-10-02 MED ORDER — cefepime (MAXIPIME) 2 g in sodium chloride 0.9% 100 mL IVPB ADDaptor
Freq: Once | INTRAVENOUS | Status: AC
Start: 2023-10-02 — End: 2023-10-02
  Administered 2023-10-02: 06:00:00 via INTRAVENOUS

## 2023-10-02 MED ORDER — abacavir-dolutegravir-lamivud (TRIUMEQ) 600-50-300 mg tablet 1 tablet
600-50-300 | Freq: Every day | ORAL | Status: AC
Start: 2023-10-02 — End: 2023-10-04
  Administered 2023-10-02 – 2023-10-04 (×3): via ORAL

## 2023-10-02 MED ORDER — ondansetron (ZOFRAN) tablet 4 mg
4 | Freq: Four times a day (QID) | ORAL | Status: AC | PRN
Start: 2023-10-02 — End: 2023-10-03
  Administered 2023-10-02 – 2023-10-03 (×2): via ORAL

## 2023-10-02 MED ORDER — nicotine (polacrilex) (NICORETTE) gum 4 mg
2 | BUCCAL | Status: AC | PRN
Start: 2023-10-02 — End: 2023-10-04
  Administered 2023-10-03 – 2023-10-04 (×3): via BUCCAL

## 2023-10-02 MED ORDER — vancomycin (VANCOCIN) 750 mg in sodium chloride 0.9 % 150 mL IVPB
Freq: Three times a day (TID) | INTRAVENOUS
Start: 2023-10-02 — End: 2023-10-03
  Administered 2023-10-02 – 2023-10-03 (×3): via INTRAVENOUS

## 2023-10-02 MED ORDER — cefepime (MAXIPIME) 2 g in sodium chloride 0.9% 100 mL IVPB ADDaptor
2 | Freq: Two times a day (BID) | INTRAVENOUS | Status: AC
Start: 2023-10-02 — End: 2023-10-04
  Administered 2023-10-02 – 2023-10-04 (×5): via INTRAVENOUS

## 2023-10-02 MED ORDER — acetaminophen (TYLENOL) tablet 650 mg
325 | Freq: Three times a day (TID) | ORAL | Status: AC
Start: 2023-10-02 — End: 2023-10-04
  Administered 2023-10-02 – 2023-10-04 (×4): via ORAL

## 2023-10-02 MED ORDER — acetaminophen (TYLENOL) tablet 650 mg
325 | ORAL | PRN
Start: 2023-10-02 — End: 2023-10-02

## 2023-10-02 MED ORDER — hydrOXYzine HCL (ATARAX) tablet 25 mg
25 | Freq: Four times a day (QID) | ORAL | PRN
Start: 2023-10-02 — End: 2023-10-04
  Administered 2023-10-02 – 2023-10-04 (×4): via ORAL

## 2023-10-02 MED ORDER — oxyCODONE (ROXICODONE) immediate release tablet 15 mg
15 | ORAL | Status: AC | PRN
Start: 2023-10-02 — End: 2023-10-04
  Administered 2023-10-02 – 2023-10-04 (×11): via ORAL

## 2023-10-02 MED ORDER — fluconazole (DIFLUCAN) tablet 100 mg
100 | Freq: Every day | ORAL | Status: AC
Start: 2023-10-02 — End: 2023-10-07
  Administered 2023-10-03 – 2023-10-04 (×2): via ORAL

## 2023-10-02 MED FILL — VANCOMYCIN 1,000 MG INTRAVENOUS INJECTION: 1000 1000 mg | INTRAVENOUS | Qty: 750

## 2023-10-02 MED FILL — CEFEPIME 2 GRAM SOLUTION FOR INJECTION: 2 2 gram | INTRAMUSCULAR | Qty: 1

## 2023-10-02 MED FILL — OXYCODONE 15 MG TABLET: 15 15 MG | ORAL | Qty: 1

## 2023-10-02 MED FILL — TRIUMEQ 600 MG-50 MG-300 MG TABLET: 600-50-300 600-50-300 mg | ORAL | Qty: 1

## 2023-10-02 MED FILL — METHADONE 10 MG/ML ORAL CONCENTRATE: 10 10 mg/mL | ORAL | Qty: 1

## 2023-10-02 MED FILL — METHADONE 10 MG/ML ORAL CONCENTRATE: 10 10 mg/mL | ORAL | Qty: 4

## 2023-10-02 MED FILL — KETOROLAC 15 MG/ML INJECTION SOLUTION: 15 15 mg/mL | INTRAMUSCULAR | Qty: 1

## 2023-10-02 MED FILL — ENOXAPARIN 40 MG/0.4 ML SUBCUTANEOUS SYRINGE: 40 40 mg/0.4 mL | SUBCUTANEOUS | Qty: 0.4

## 2023-10-02 MED FILL — CLONIDINE HCL 0.1 MG TABLET: 0.1 0.1 MG | ORAL | Qty: 1

## 2023-10-02 MED FILL — NICOTINE (POLACRILEX) 2 MG GUM: 2 2 mg | BUCCAL | Qty: 2

## 2023-10-02 MED FILL — NICOTINE 21 MG/24 HR DAILY TRANSDERMAL PATCH: 21 21 mg/24 hr | TRANSDERMAL | Qty: 1

## 2023-10-02 MED FILL — HYDROMORPHONE 0.5 MG/0.5 ML INJECTION SYRINGE: 0.5 0.5 mg/0.5 mL | INTRAMUSCULAR | Qty: 0.5

## 2023-10-02 MED FILL — LIDOCAINE 5 % TOPICAL PATCH: 5 5 % | TOPICAL | Qty: 1

## 2023-10-02 MED FILL — TYLENOL 325 MG TABLET: 325 325 mg | ORAL | Qty: 2

## 2023-10-02 MED FILL — ONDANSETRON HCL 4 MG TABLET: 4 4 MG | ORAL | Qty: 1

## 2023-10-02 MED FILL — HYDROXYZINE HCL 25 MG TABLET: 25 25 MG | ORAL | Qty: 1

## 2023-10-02 MED FILL — AMLODIPINE 5 MG TABLET: 5 5 MG | ORAL | Qty: 1

## 2023-10-02 MED FILL — VANCOMYCIN 1.25 GRAM INTRAVENOUS SOLUTION: 1.25 1.25 gram | INTRAVENOUS | Qty: 1

## 2023-10-02 NOTE — Consults (Signed)
Inpatient consult to Addiction Services  Consult performed by: Gaye Pollack, MD  Consult ordered by: Ephriam Jenkins, DO  Reason for consult: OUD treatment      The following clinical information related to substance use is CONFIDENTIAL and protected by Levon Hedger. ACCESS TO THIS INFORMATION IS ON A NEED-TO-KNOW BASIS ONLY AND IS PROVIDED FOR THE PURPOSE OF ASSURING APPROPRIATE MEDICAL CARE. Federal regulations (42 CFR, Part 2) prohibit the release of this information without specific written consent of the patient. A general authorization for the release of medical information is NOT sufficient for the purpose of releasing the following information.      INPATIENT INITIAL ADDICTION MEDICINE CONSULT NOTE   Author: Ellison Hughs, MD  PCP: Herbert Pun, MD  Hospital Day: 0  Admit Date: 10/01/2023      Consult requested by: Primary team     REASON FOR CONSULTATION: OUD at risk for withdrawal     HPI:   Alexander [name] is a 37 y.o. male with PMH of HIV, HVC, IV SUD, severe EtOH use disorder with prior severe withdrawal and cellulitis who presented on 10/28 for worsening of bilateral lower extremity cellulitis.     Patient recently admitted to [facility] 10/9 through 10/14 for left lower extremity cellulitis.  At the time of discharge he was sent out with acamprosate to help with EtOH cravings and while in the hospital he was treated with methadone 50 mg for AUD.  This was noted to be a reduced dose due to interaction with fluconazole which she was also receiving at the time for candidal esophagitis.     Today, patient shares that he did not follow-up with [facility] clinic at discharge as planned following to 10/14 admission because he has been struggling with homelessness and transportation.  He also shares that he would like to stop substance misuse.  Since his discharge on 10/14 he has had only one half of a can of beer.  Previously he was drinking 1 full case (24) of beer daily.  He believes the medicine a acamprosate has  helped with this and also the fact that he just does not want to drink anymore.     He shares that his last IV fentanyl use was yesterday and it was a little over 1 g.  States he usually takes IV 2 g daily.  He states that fentanyl helps him by taking away the pain.  He says this is both physical pain and emotional pain. He has tried medications prescribed by a doctor for anxiety and depression in the past and these did not work well enough for him.     He denies other substance use including no use of marijuana or amphetamines.  At this moment he does feel like he is withdrawing and is experiencing pain in his lower extremities due to the cellulitis and nausea.     Injection Drug use: yes  Opioid Overdose in the last year:  no (did in 2015)     Signs of opioid use disorder in the last 12 months:  1) Recurrent use resulting in failure to fulfill major role obligations  yes  2) Recurrent use in physically hazardous situations  yes  3) Continued use despite recurrent social or interpersonal problems exacerbated by substance  yes  4) Tolerance:  yes  5) Withdrawal: yes  6) Taking substance in larger amounts and longer than intended:  yes  7) Having a persistent desire or unsuccessful effort to cut down or  control use:   yes  8) Spending a great deal of time to obtain substance or recover from its effects:   not reviewed  9) Giving up important social, occupational, or recreational activities:  yes  10) Using the substance despite knowledge of having a persistent physical or psychological problem that has been caused by the substance:  yes  11) Craving  yes  TOTAL = at least 10  2-3 = mild, 4-5 = moderate, >6 severe     OTHER SUBSTANCE USE HISTORY:  Past Tobacco use: yes  Current Tobacco use:  yes - ~1.5 ppd  Interested in quitting?  No - not quite at this moment ready to quit but working opn it -- gum and lozenges help  Past Etoh use:  yes - over a case of beer daily   Current Etoh use:  no - had 1/2 beer since last  here  Past Illicit Drug use: yes  Current Illicit Drug use:  yes  Family Hx of substance use disorder:  yes  Active Substance use in household:  n/a  History of substance use disorder treatment: no              Where and when? [facility]- there for about 1 year and kicked him out bc couldnn't quit drinking  Hx of DUI?  Yes - 2015  Hx of incarceration/probation?  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, Engineer, drilling:      History of Trauma: yes     ROS:  Withdrawal symptoms currently:  Chills/sweats Yes  N/V/D Yes  Muscle/joint aches Yes  Irritability Yes  Anxiety Yes  Depression Yes     Hx of Mental Illness: yes, anxiety and depression     PMH:  Past Medical History        Past Medical History:   Diagnosis Date    Anxiety      Bipolar disorder (CMS-HCC)      Bronchitis      Depression      Drug abuse and dependence (CMS-HCC)      H/O tooth extraction      Hepatitis C      HIV infection (CMS-HCC)      Liver disease Hep C    PTSD (post-traumatic stress disorder)      Substance induced mood disorder (CMS-HCC)      Thyroid disease              INPATIENT MEDS:    Current Medications      Current Facility-Administered Medications:     abacavir-dolutegravir-lamivud (TRIUMEQ) 600-50-300 mg tablet 1 tablet, 1 tablet, Oral, Daily 0900, Kavya Akella, DO    acetaminophen (TYLENOL) tablet 650 mg, 650 mg, Oral, Q4H PRN, Kavya Akella, DO    amLODIPine (NORVASC) tablet 5 mg, 5 mg, Oral, Daily 0900, Kavya Akella, DO, 5 mg at 10/02/23 1138    cefepime (MAXIPIME) 2 g in sodium chloride 0.9% 100 mL IVPB ADDaptor, 2 g, Intravenous, Q12H, Kavya Akella, DO    cloNIDine HCL (CATAPRES) tablet 0.1 mg, 0.1 mg, Oral, Q12H PRN, Kavya Akella, DO    dextrose 10%-water (D10W) IV soln, 12.5 g, Intravenous, Q15 Min PRN **OR** dextrose 10%-water (D10W) IV soln, 25 g, Intravenous, Q15 Min PRN, Kavya Akella, DO    enoxaparin (LOVENOX) syringe 40 mg/0.4 mL, 40 mg, Subcutaneous, Daily 0900, Kavya Akella, DO, 40 mg at 10/02/23 1138    glucose chewable tablet 12 g, 12 g,  Oral, Q15 Min PRN, Kavya Akella, DO    ibuprofen (MOTRIN) tablet 400 mg, 400 mg, Oral, Q6H PRN, Kavya Akella, DO    loperamide (IMODIUM) capsule 2 mg, 2 mg, Oral, 4x Daily PRN, Kavya Akella, DO    methadone (DOLOPHINE) 10 mg/mL oral concentrate 40 mg, 40 mg, Oral, Once PRN **AND** methadone (DOLOPHINE) 10 mg/mL oral concentrate 5 mg, 5 mg, Oral, Q4H PRN, Kavya Akella, DO    nicotine (NICODERM CQ) 21 mg/24 hr 1 patch, 1 patch, Transdermal, Daily 0900, Kavya Akella, DO, 1 patch at 10/02/23 1138    nicotine (polacrilex) (NICORETTE) gum 4 mg, 4 mg, Buccal, Q1H PRN, Kavya Akella, DO    ondansetron (ZOFRAN) tablet 4 mg, 4 mg, Oral, Q6H PRN, Kavya Akella, DO    [START ON 10/03/2023] pantoprazole (PROTONIX) EC tablet 40 mg, 40 mg, Oral, QAM AC, Kavya Akella, DO    vancomycin (VANCOCIN) 750 mg in sodium chloride 0.9 % 150 mL IVPB, 15 mg/kg, Intravenous, Q8H, Kavya Akella, DO, Last Rate: 150 mL/hr at 10/02/23 1135, 750 mg at 10/02/23 1135     Current Outpatient Medications:     abacavir-dolutegravir-lamivud (TRIUMEQ) 600-50-300 mg Tab, Take 1 tablet by mouth daily., Disp: 30 tablet, Rfl: 2    acamprosate (CAMPRAL) 333 mg tablet, Take 2 tablets (666 mg total) by mouth 3 times a day., Disp: 180 tablet, Rfl: 0    albuterol 90 mcg/actuation Inhl inhaler, Inhale 1 puff into the lungs every 6 hours as needed., Disp: 18 g, Rfl: 0    amLODIPine (NORVASC) 5 MG tablet, Take 1 tablet (5 mg total) by mouth daily., Disp: 30 tablet, Rfl: 0    fluconazole (DIFLUCAN) 100 MG tablet, Take 1 tablet (100 mg total) by mouth daily for 17 days., Disp: 17 tablet, Rfl: 0    gabapentin (NEURONTIN) 300 MG capsule, Take 1 capsule (300 mg total) by mouth 3 times a day., Disp: 90 capsule, Rfl: 1    methocarbamoL (ROBAXIN) 500 MG tablet, Take 1 tablet (500 mg total) by mouth 4 times daily before meals and at bedtime., Disp: 120 tablet, Rfl: 1    nicotine polacrilex (NICORETTE) 4 MG gum, Place 1 each (4 mg total) inside cheek every hour as needed for  nicotine cravings., Disp: 110 each, Rfl: 0    nystatin (MYCOSTATIN) 100,000 unit/mL suspension, Swish and swallow 5 mLs (500,000 Units total) 4 times a day., Disp: 473 mL, Rfl: 0    pantoprazole (PROTONIX) 40 MG tablet, Take 1 tablet (40 mg total) by mouth every morning before breakfast., Disp: 30 tablet, Rfl: 0    naloxone (NARCAN) 4 mg/actuation Spry, Apply 1 spray in one nostril if needed. Call 911. May repeat dose in other nostril if no response in 3 minutes., Disp: 2 each, Rfl: 0        has no known drug allergies or adverse reactions.     SOCIAL HX:  Address: [address]  HOMELESS  [place] [address]  Homeless: Yes  Has housing vouchers.   Never stayed at a residential facility for recovery.   Current Legal issues: No  Dad and 2 brothers are deceased. Mom and sister live in [place]. No other supports here except his 2 dogs.     PHYSICAL EXAM:  BP 131/83 (BP Location: Right upper arm, Patient Position: Lying, BP Cuff Size: Small)   Pulse 73   Temp 98.2 F (36.8 C) (Oral)   Resp 12   Wt 116 lb 13.5 oz (53 kg)  SpO2 100%   BMI 17.25 kg/m   General Appearance: pleasant, engaged, NAD but appears uncomfortable  HEENT: clear sclera, EOMI, clear speech  Thorax: Normal respiratory effort, talks in full sentences  Abdomen: Non-distended  Extremitites: Moving spontaneously  Musculoskeletal: Moves all extremities spontaneously  Skin: No jaundice, warm, intact  Psych: linear thoughts, pleasant  Neuro: Alert, mild tremor     DATA:  Last Drug Screen: +10/10 and positive for barbiturates cocaine oxycodone and fentanyl     UDS available for review: No - not yet for this admission; Positive for:   []  benzodiazepines, []  barbiturates, []  cocaine, []  THC, []  amphetamines,   []  methamphetamines, []  opiates, []  oxycodone, []  fentanyl, []  methadone,  []  buprenorphine, []  MDMA, []  PCP     Complete Blood Count:         Recent Labs     10/02/23  0130   WBC 6.3   HGB 10.9*   HCT 33.0*   MCV 80.9   PLT 94*          Basic Metabolic Panel:         Recent Labs     10/02/23  0130   NA 132*   K 3.5   CL 99   CO2 26   BUN 7        Hepatic Panel:         Recent Labs     10/02/23  0130   AST 104*   ALT 48   ALBUMIN 3.1*         __________________________________________________   IMPRESSION:  Alexander [name] is a 37 y.o. male with PMH of HIV, HVC, IV SUD, severe EtOH use disorder with prior severe withdrawal and cellulitis who presents with bilateral lower extremity cellulitis.  The addiction team was consulted for management of opioid use disorder high risk for withdrawal.  He would like to restart methadone and connect to care.Reports cutting back signficiantly on his alcohol use.      Social Issues Addressed/Barriers to Care: Homelessness, transportation     Diagnosis:  1. Severe Opioid Use Disorder  2. Severe Alcohol Use Disorder   3. Nicotine Dependence     Patient history is consistent with severe opioid use disorder.  He expresses motivation to completely cease opioid use.  He is interested in reestablishing care with [facility] clinic. Fortunately he has recently made progress in this regard, though he still meets criteria for severe alcohol use disorder (will be considered in remission after 3 months of abstinence).  Given 2 g of IV fentanyl use daily patient does have a high tolerance and thus we are inclined to more rapidly escalate his methadone dose so that he receives up to a total of 60 mg of methadone today and will start with 50 mg of methadone tomorrow morning with further PRNs to be established tomorrow.  Patient is not interested in discussing residential possibilities for opioid management now but is open to discussing this in the future once his pain and current condition has improved.     Discussed risks, benefits, and options for treatment, expectations for treatment, and limitations of confidentiality. Provided psychoeducation about opioid use disorder, use of medications, effect of substances on mental and  physical health, use of 12-step or other peer support groups and techniques for relapse prevention.       RECOMMENDATIONS:  - obtain urine drug test   - agree with methadone 40 mg provided earlier today  - methadone 10 mg q4hr  prn x2 doses for COWS>8  (max daily dose 60mg  TDD)  - tomorrow: methadone 50 mg in AM   - oxycodone 15 mg every 4 hours prn pain (okay to increase per primary team if pain remains uncontrolled)  - ok to continue home acamprosate 666 mg 3 times daily  - NRTs (patient prefers gums and lozenges)  - clonidine 0.1 mg q6 hrs hold if SBP < 100  - optimize multimodal pain regimen as able (could consider scheduling Tylenol or ibuprofenfor pain due to cellulitis)  - will discuss possibility of residential recovery options in days to come  - intranasal naloxone at discharge     THESE RECOMMENDATIONS ARE PRELIMINARY UNTIL SIGNED BY THE ATTENDING     Ellison Hughs, MD, MPH  she, her, hers  Internal Medicine & Pediatrics, PGY-4  (p): [phone]        I have personally seen and evaluated the patient face-to-face and I performed key elements of the history and exam. Patient discussed in rounds including the treatment plan and course of action. I agree with the documented history, exam, and treatment plan stated ,  I formulated the plan with the resident and with the treatment team, agree with the resident's  documentation of BRAXSTON BELLEW     Agree with plan to restart MMT. I evaluated him after he had received the 40mg  w/ continued opioid w/d symptoms, can have an addition 2x doses PRN, uses 2 grams of fentanyl IV so high tolerance.  Agree with full agonist opioids for pain and w/d, as well as scheduling clonidine. Encouraged pt on cutting down on alcohol use!

## 2023-10-02 NOTE — Consults (Signed)
[place]  Care Management/Social Work Assessment      Patient Information     Patient Name: Alexander [name]  MRN: [id number]  Hospital Day: 0  Inpatient/Observation: Inpatient  Admit Date: 10/01/2023  Admission Diagnosis: No admission diagnoses are documented for this encounter.  Attending provider: Luevenia Maxin, MD    PCP: Herbert Pun, MD  Home Pharmacy:              [facility] [id number] - [address] [facility] - [address]  [address]  [place] [address]  Phone: [phone]     [facility]  [address] [place] [address]  Phone: [phone]     [facility] [place], [place] - [address]  [address] [place] [address]  Phone: [phone]     [facility] [id number] [place], [place] - [address] AT [address]  [address]  [place] [address]  Phone: [phone]     [facility]  [address]  [address] [place] [address]  Phone: [phone]     [facility] [id number] [place], [place] - [address]  [address] [place] [address]  Phone: [phone]     [facility]  [address]  [place] [address]  Phone: [phone]               Issues related to obtaining medications: N/A    Payor Information   Medical Insurance Coverage:  Payor: MOLINA / Plan: MOLINA / Product Type: Medicaid Mngd care /   Secondary Payor: N/A    Functional Assessment   Functional Assessment  Assessment Information Obtained From:: Patient  May We Obtain Collateral Information From Family, Friends and Neighbors?: Yes  How do you wish to be addressed?: Alexander [name]  Current Mental Status: Awake, Oriented to Person, Oriented to Place, Oriented to Time, Oriented to Situation  Mental Status Prior to Admission: Unable to Assess  Mental Health History: Yes  Behavioral Health Agency Involvement: Yes  Behavioral Health Agency Name: [facility]  Behavioral Health Agency Phone Number: [phone]  Do you need  Mental Health treatment resources?: No  Patient Requests Social Work Consult?: No  Substance Abuse: Yes  Last Substance Abuse Date: 09/18/23  Do you need Substance Abuse Treatment Resources?: No  Social Work Consult for Substance Abuse Ordered?: No  Activities of Daily Living: Independent  Work History: Unemployed  Marital Status: Single  Number of children and their names: 0  Relative Search Completed: No  Demographics Correct:: Yes    Current Living Arrangements   Current Living Arrangements  Current Living Arrangements: Homeless  History of Falls?: No    Electrical engineer at Home: Not Applicable  Was any abuse reported by patient?: No    Veteran Status & Connection to [employer] Status & Connection to [employer]  Are you a veteran?: No    Support Systems   Emergency contact: Extended Emergency Contact Information  Primary Emergency Contact: Little,Bernell  Mobile Phone: [phone]  Relation: Friend  Secondary Emergency Contact: Sueanne Margarita  Address: No address given   [place]  Home Phone: [phone]  Mobile Phone: [phone]  Relation: Sister  Preferred language: English  Interpreter needed? No  Mother: Jamarques, Erich  Address: no address given   [place]  Home Phone:  [phone]    Support Systems  Legal Status: N/A  Primary Caregiver: Self  Times of available support: No 24/7 hands on available  Marital Status: Single  Number of children and their names: 0  Relative Search Completed: No  Demographics Correct:: Yes  Expected Discharge Disposition: Other (Comment) (TBD)  Next of Kin: [name] Portocarrero  Next of Kin Relationship: Mother  Next of Kin Phone Number: [phone]  Assessment Information Obtained From:: Patient    Other Pertinent Information     Patient does not have HCPOA paperwork on file. Patient shared he does not have a HCPOA. LNOK is patient's mother. Patient shared he is not certain if his family's  contact numbers on file are still accurate. Patient requested to have [name] removed from contact list, sharing he is an old CM. Patient requested to have friend, Bernell Little, added to contact list, and made emergency contact. Patient reports they share a cell phone. Patient requested to add mobile number to chart. SW updated facesheet.        Discharge Plan     SW met with patient to initiate discussion regarding discharge planning and complete psychosocial assessment. Introduced self and role of case management/social work. Verified demographics.     Patient is a 37 y.o. male who is currently experiencing homelessness. Patient reports he has been sleeping in the woods. Patient reports he is working with the [employer] time on securing housing. Patient reports no history of falls. Patient reports no use of DME or O2. Patient is not active with HHC.     Patient reports having a current PCP, listed on facesheet. Patient reports mental health history of depression and anxiety. Patient reports he is connected with [facility] for care, is uncertain who his current CM is. Patient reports substance history involving alcohol abuse and opioid abuse. Alcohol audit completed. Patient reports he does not need any SUD resources. PT/OT consulted. Addictions consulted. Patient reports he would need bus passes at discharge.     Anticipated Discharge Plan: Pending medical course & PT/OT    Anticipated Discharge Date: 10/30    Anticipated Transportation: Bus    Patient/Family aware and taking part in the discharge plan.  Patient/family educated that once post-acute care needs have been identified, a provider list applicable to the identified post-acute care needs as well as the insurance provider will be provided, and patient/family have the freedom to choose their provider(s); financial interest(s) are disclosed as appropriate.         Nidal Rivet MSW, LISW  Phone Number: [phone]

## 2023-10-02 NOTE — ED Notes (Signed)
Ready and clean bed assigned to [id number]. Pt updated on plan of care including transfer and is agreeable. Receiving RN may call [phone] to consult ED RN with questions regarding patients care. Pt is leaving the department in stable condition with all personal items in possession.   Ordered medications that have been received from Pharmacy will not be tubed to receiving unit.  The patient does not have a patient monitor at bedside in the ED.  Most recent vitals: BP (!) 154/97 (BP Location: Right upper arm, Patient Position: Lying, BP Cuff Size: Small)   Pulse 75   Temp 98.2 F (36.8 C) (Oral)   Resp 14   Wt 116 lb 13.5 oz (53 kg)   SpO2 99%   BMI 17.25 kg/m     Theodoro Parma, RN ,RN

## 2023-10-02 NOTE — H&P (Signed)
Department of Internal Medicine  History & Physical    Patient: Alexander [name]  MRN: [id number]    Chief Complaint     Bilateral lower extremity swelling    History of Present Illness     Alexander [name] is a 37 y.o. male with a history of HIV, (last CD4 295), HCV substance use dx, EtOH, recent L leg cellulitis presenting to the hospital with bilateral lower extremity swelling s/p mechanical fall.    Patient presenting after fall after tripping, resulting in left knee swelling but no lacerations. He denied drainage, pus, or blood from lower extremities.     Of note, he was recently admitted for LLE cellulitis for which he was discharge on augmentin and doxycycline. He stated he finished the antibiotic course, but LLE skin changes had not completely resolved. At that admission, patient reported odynophagia last admission, with biopsy proven esophageal candidiasis. Prescribed fluconazole and nystatin swish and swallow on discharge. Odynophagia has improved. He also shared the RLE swelling started after hospital discharge.       Patient endorsed active IV fentanyl use, with last use yesterday. During prior admission, plan on discharge was to start methadone 50 mg daily. However, secondary to pain , patient was unable to go to clinic.     Endorsed non-radiating, intermittent chest tightness that possibly gets worse on exertion. No active chest pain.     Patient reporting heavy drinking prior to last admission, drinking a case a day. However, since discharge (09/18/23) has only had half can of beer that was 5 to 6 days ago. Per history, patient has been admitted to hospital previously for both withdrawal and seizures secondary to withdrawal.    He denied fever, chills, nausea, vomiting, diarrhea.     In the ED, Temp 98.59F, HR 89, RR 17, SpO2 100%, BP 147/101. AST elevated to 104, hgb 10.9 (stable), Plt 94 (stable) L knee x-ray: 1st: new cortical irregularity of the lateral tibial plateau, but unclear if real vs artifact  (repeat Left Knee imaging recommended once external objects/artifact cleared), no acute osseous abnormalities, moderate-sized l knee effusion; (2nd) c/f small marginal osteophyte, no definite fracture. Stable diffuse soft tissue swelling, moderate size L knee joint effusion. S/p vanc x1, cefepime x1    Review of Systems     Please see above.     Past Medical and Social History     Past Medical History:   Diagnosis Date    Anxiety     Bipolar disorder (CMS-HCC)     Bronchitis     Depression     Drug abuse and dependence (CMS-HCC)     H/O tooth extraction     Hepatitis C     HIV infection (CMS-HCC)     Liver disease Hep C    PTSD (post-traumatic stress disorder)     Substance induced mood disorder (CMS-HCC)     Thyroid disease      Past Surgical History:   Procedure Laterality Date    ESOPHAGOGASTRODUODENOSCOPY N/A 09/15/2023    Procedure: EGD WITH BIOPSY;  Surgeon: Royetta Asal, MD;  Location: [facility];  Service: Gastroenterology;  Laterality: N/A;    MANDIBLE SURGERY      OPEN REDUCTION INTERNAL FIXATION MANDIBLE Bilateral 01/27/2021    Procedure: OPEN REDUCTION INTERNAL FIXATION MANDIBLE, MAXILLOMANDIBULAR FIXATION;  Surgeon: Ellery Plunk, MD;  Location: [facility] OR;  Service: ENT;  Laterality: Bilateral;     Family History   Problem Relation Age of Onset    Drug  abuse Brother     Suicidality Brother     Hypertension Mother     Transient ischemic attack Mother     Seizures Father     Hypertension Father     Cancer Father     Breast Cancer Father     Suicidality Father     Drug abuse Brother     Suicidality Brother      Social History     Tobacco Use    Smoking status: Every Day     Current packs/day: 1.00     Average packs/day: 1 pack/day for 11.0 years (11.0 ttl pk-yrs)     Types: Cigarettes    Smokeless tobacco: Never   Substance Use Topics    Alcohol use: Yes     Comment: a case of beer everyday    Drug use: Yes     Types: Heroin, Other-see comments, Marijuana, Cocaine     Comment: fentanyl -- 10/0/24         Home Medications     Prior to Admission medications as of 09/15/23 0905   Medication Sig Taking?   abacavir-dolutegravir-lamivud (TRIUMEQ) 600-50-300 mg Tab Take 1 tablet by mouth daily. Yes   acamprosate (CAMPRAL) 333 mg tablet Take 2 tablets (666 mg total) by mouth 3 times a day. Yes   albuterol 90 mcg/actuation Inhl inhaler Inhale 1 puff into the lungs every 6 hours as needed. Yes   amLODIPine (NORVASC) 5 MG tablet Take 1 tablet (5 mg total) by mouth daily. Yes   fluconazole (DIFLUCAN) 100 MG tablet Take 1 tablet (100 mg total) by mouth daily for 17 days. Yes   gabapentin (NEURONTIN) 300 MG capsule Take 1 capsule (300 mg total) by mouth 3 times a day. Yes   methocarbamoL (ROBAXIN) 500 MG tablet Take 1 tablet (500 mg total) by mouth 4 times daily before meals and at bedtime. Yes   nicotine polacrilex (NICORETTE) 4 MG gum Place 1 each (4 mg total) inside cheek every hour as needed for nicotine cravings. Yes   nystatin (MYCOSTATIN) 100,000 unit/mL suspension Swish and swallow 5 mLs (500,000 Units total) 4 times a day. Yes   pantoprazole (PROTONIX) 40 MG tablet Take 1 tablet (40 mg total) by mouth every morning before breakfast. Yes   naloxone (NARCAN) 4 mg/actuation Spry Apply 1 spray in one nostril if needed. Call 911. May repeat dose in other nostril if no response in 3 minutes.    nicotine (NICODERM CQ) 21 mg/24 hr Place 1 patch onto the skin daily.        No Known Drug Allergies or Adverse Reactions  Physical Exam     Temp:  [98.1 F (36.7 C)-98.2 F (36.8 C)] 98.2 F (36.8 C)  Heart Rate:  [73-92] 73  Resp:  [12-17] 12  BP: (131-154)/(83-101) 131/83    Gen: Age-appropriate adult, NAD. Alert.   HEENT: normocephalic and atraumatic   CV: RRR, S1 and S2 appropriate, no murmur appreciated. Tenderness to palpation of left chest wall.  PULM: CTAB anteriorly. No wheezes/rales/rhonchi appreciated. Normal respiratory effort.  ABD: +BS, chronic tenderness to palpation, non-distended.  EXT: bilateral lower extremity  swelling. Left knee swelling  SKIN: LLE with erythema proximally to above the knee. RLE swelling proximally to below shin (please see media tab)  NEURO: Alert, answers questions appropriately, moves all extremities spontaneously.   PSYCH: Appropriate affect, appropriate eye contact.      Diagnostic Studies     Labs: I have personally reviewed labs.  I have personally reviewed imaging studies.    Assessment & Plan     Alexander [name] is a 36 y.o. with a history of HIV, (last CD4 295), HCV substance use dx, EtOH recent L leg cellulitis, presenting to the hospital with lower extremity swelling bilaterally.    #Fall  #left knee swelling  Patient presenting after fall with knee injury. In the ED, L knee x-ray: 1st: new cortical irregularity of the lateral tibial plateau, but unclear if real vs artifact (repeat Left Knee imaging recommended once external objects/artifact cleared), no acute osseous abnormalities, moderate-sized l knee effusion; (2nd) c/f small marginal osteophyte, no definite fracture. Stable diffuse soft tissue swelling, moderate size L knee joint effusion. Afebrile, no leukocytosis. Low concern for septic joint.  -CTM    #substance use disorder   Patietn endorsed active IV fentanyl use, with last use yesterday. During prior admission, plan on discharge was to start methadone 50 mg daily. However, secondary to pain , patient was unable to go to clinic.   -addiction consulted  -methadone 40 mg x1 for active withdrawal    #Left lower extremity cellulitis  Patient presenting with bilaterally lower extremity swelling after fall. He was recently admitted for LLE cellulitis and reported completing antibiotic course but symptoms did not completely resolve.   -IV vanc and cefepime, if cellulitis improving will consider deescalating to PO regimen   -Bcx pending     #bilateral lower extremity swelling  Patient presenting with lower extremity swelling. RLE swelling started after recent admission could be secondary  to Dvt. LLE swelling started after fall, however imagine unremarkable for acute osseous abnormality.  -venous duplex bilaterally  -TTE ordered to work up cardiac etiology    #chest pain  Endorsed non-radiating, intermittent chest tightness that possibly gets worse on exertion. No active chest pain. EKG without signs of acute ischemia. Troponin flat.   -repeat EKG  -CTM    #EtOH use  Patient reporting heavy drinking prior to last admission, drinking a case a day. However, since discharge (09/18/23) has only had half can of beer that was 5 to 6 days ago. Per history, patient has been admitted to hospital previously for both withdrawal and seizures secondary to withdrawal. Patient out of the withdrawal window given last drink  5 to 6 day ago.    #odynophagia  #esophageal candidiasis  Patient reported odynophagia last admission, with biopsy proven esophageal candidiasis. Prescribed fluconazole and nystatin swish and swallow on discharge   -continue fluconazole     #HIV  Patient was diagnosed 7 to 8 years ago. He reported being off of therapy prior to last admission. Prescribed Triumeq per ID recs. Undetectable as of 10.10/124, CD4 count 295  -continue Triumeq    #tobacco use   Smokes 8 cigars a day. Transitioned to cigars from cigarettes given due cost. Has been smoking for the last 17 years. He was discharged on nicotine patch and gum  -continue NRT    Orders Placed This Encounter   Procedures    Diet Regular(7)     DVT prophylaxis: Enoxaparin (prophylaxis)  Code Status: Full Code      Ephriam Jenkins, DO  Internal Medicine Resident

## 2023-10-02 NOTE — ED Provider Notes (Signed)
ED Attending Attestation Note    Date of service:  10/01/2023    This patient was seen by the Resident Physician.  I have seen and examined the patient, agree with the workup, evaluation, management and diagnosis. The care plan has been discussed and I concur.  I have reviewed the ECG and concur with the interpretation.    I was present for the following procedures: None    My assessment reveals a 37 y.o. male with a history of HIV with a last CD4 count of 295, substance use disorder, recent left leg cellulitis, who comes in due to recurrent cellulitis.  The patient reports that he had been on antibiotics and his cellulitis improved but now it has worsened again.  It is now both in the left leg and in the right leg.  It is worse on the left.  On exam, he has dry skin and cracked skin between his toes.  He has erythema and heat and swelling up to the knee on the left and up to the shin on the right concerning for cellulitis.  No focal fluid collections noted.  Good range of motion of the ankle and knee without signs of septic joint.  No other symptoms.      Jules Husbands, MD

## 2023-10-02 NOTE — Progress Notes (Signed)
[place] Clinical Pharmacy Service: Vancomycin Monitoring Consult       Alexander [name] is a 37 y.o. male currently being treated for cellulitis.     Patient has no known drug allergies or adverse reactions.    Pharmacy consulted for vancomycin management by [facility] medicine.      Current Anti-Infectives         Dose Frequency Start End    cefepime (MAXIPIME) 2 g in sodium chloride 0.9% 100 mL IVPB ADDaptor (Completed) 2 g Once 10/02/2023 10/02/2023    Admin Instructions: Use ADDaptor product - Mix Thoroughly Before Administration    Route: Intravenous    cefepime (MAXIPIME) 2 g in sodium chloride 0.9% 100 mL IVPB ADDaptor 2 g Every 12 hours 10/02/2023 --    Admin Instructions: **DOUBLE CHECK LENGTH OF INFUSION BEFORE ADMINISTRATION**  Use ADDaptor product - Mix Thoroughly Before Administration    Route: Intravenous    vancomycin (VANCOCIN) 1,250 mg in sodium chloride 0.9% 250 mL ADDaptor IVPB (Completed) 25 mg/kg  53 kg Once 10/02/2023 10/02/2023    Admin Instructions: Contact pharmacy if there is a question/concern of whether vancomycin should be given based on serum drug levels.  Use ADDaptor product - Mix Thoroughly Before Administration    Route: Intravenous    vancomycin (VANCOCIN) 750 mg in sodium chloride 0.9 % 150 mL IVPB 15 mg/kg  53 kg Every 8 hours 10/02/2023 --    Admin Instructions: Contact pharmacy if there is a question/concern of whether vancomycin should be given based on serum drug levels.    Route: Intravenous          documented within (last 72 hours)       Date/Time Action Medication Dose Rate    10/02/23 0304 New Bag    vancomycin (VANCOCIN) 1,250 mg in sodium chloride 0.9% 250 mL ADDaptor IVPB 1,250 mg 200 mL/hr            --Objective Data--     Vitals:    10/02/23 0012 10/02/23 0116 10/02/23 0754   BP: (!) 147/101  (!) 154/93   Pulse: 89  92   Resp: 17  17   Temp: 98.1 F (36.7 C)     TempSrc: Oral     SpO2: 100%  99%   Weight:  116 lb 13.5 oz (53 kg)        No intake/output data  recorded.    WBC, BUN, Creatinine  (Last 7 days)        Today 0130    WBC             6.3              BUN             7              Creatinine             0.57                      Ideal body weight: 70.7 kg (155 lb 13.8 oz)  Estimated CrCl: >120 mL/min     --Cultures--     Microbiology Results       Date and Time Order Name Sensitivity Status Organisms Specimen ID Source    10/02/2023  1:42 AM #1  Blood culture-Peripheral site 1  Preliminary  [id number] Peripheral            --Vancomycin Concentrations--  Lab Results  (Last 7 days)   No relevant labs found            --Assessment and Plan--          Patient is a 37 y.o. male being treated with vancomycin for cellulitis.  Based on patient's age and renal function, will start vancomycin 1250 mg x1, followed by 750 mg every 8 hours.  Will check vancomycin serum concentration  prior to the 5th dose .  Goal vancomycin trough of 10-15 mg/L  Pharmacy will continue to monitor therapy for efficacy and toxicity.       Thank you for the consult.    Joelie Schou E. Che Below, BS, PharmD, BCPS  Clinical Pharmacy Specialist, Internal Medicine  Preferred Communication: Epic Chat  10/02/2023

## 2023-10-02 NOTE — ED Notes (Signed)
Pt up to wheelchair and assisted to restroom.

## 2023-10-02 NOTE — Care Coordination-Inpatient (Addendum)
[name]  Alcohol Audit      Alexander [name]  [id number]  [birth year]      Alcohol Level  Alcohol Level obtained?: No  Appropriate for Audit?:  (Patient reports he recently stopped drinking approximately 2 weeks ago. Answers reflect use during that time)  Comments:N/A    Audit Questionnaire  How often do you have a drink containing alcohol?: 4 or more times a week  How many drinks do you have?: 10 or more  How often do you have 5 or more drinks?: Daily or almost daily  How often have you been unable to stop drinking?: Daily or almost daily  How often have you failed to do what was expected?: Weekly  How often have you needed a first drink in the morning?: Daily or almost daily  How often do you have guilt or remorse?: Daily or almost daily  How often have you been unable to remember what happened?: Never  Have you or someone else been injured due to your drinking?: No  Have others been concerned about your drinking?: Yes, during the last year  Total Score: 31    Intervention: Score greater than or equal to 8 (greater than or equal to 4 for women and ages 16-21 & 79 or older) indicates a strong likelihood of hazardous or harmful alcohol consumption  Intervention: AUDIT & Brief Intervention 15-30 minutes  Comments:  Patient declined need for SUD resources. Addictions consulted    Myley Bahner LISW  10/02/2023  5:31 PM

## 2023-10-02 NOTE — ED Provider Notes (Signed)
[facility] ED Reassessment Note    Alexander [name] is a 37 y.o. male patient who presented to the Emergency Department. This patient was initially seen by an off-going provider. Please see that provider's note for details regarding the initial history, physical exam and ED course.    Care of this patient was signed out to me. I reviewed initial history and physical examination information performed by the off-going provider. The patient was evaluated by myself and the ED Attending Physician, Dr. Urbano Heir.     ED Course and MDM     Alexander [name] is a 37 y.o. male with history of HIV, Hep C, substance use, recent admission cellulitis of LE who presented to the emergency department with leg swelling.    Had fall, no lesions but leg is red and swollen  Did complete doxy and augmentin  Vanc and cefepime  Insidious onset, pain isn't out of proportion, no crepitus      At this time, the following is pending:   - imaging  - EKG  - lab results  - admit to medicine    ED Course & Medical Decision Making Following Reassessment:  - Labs at baseline, trop 5 --> 6  - Imaging only notable for moderate left knee effusion    Medical Decision Making  Problems Addressed:  Cellulitis of lower extremity, unspecified laterality: complicated acute illness or injury    Amount and/or Complexity of Data Reviewed  Labs: ordered.  Radiology: ordered.  ECG/medicine tests: ordered.    Risk  Prescription drug management.  Decision regarding hospitalization.        Summary of Treatment in ED:    Medications   ketorolac (TORADOL) injection 15 mg (15 mg Intravenous Given 10/02/23 0203)   vancomycin (VANCOCIN) 1,250 mg in sodium chloride 0.9% 250 mL ADDaptor IVPB (0 mg Intravenous Stopped 10/02/23 0500)   cefepime (MAXIPIME) 2 g in sodium chloride 0.9% 100 mL IVPB ADDaptor (0 g Intravenous Stopped 10/02/23 0300)   HYDROmorphone (DILAUDID) injection 0.5 mg (0.5 mg Intravenous Given 10/02/23 0204)       Impression     1. Cellulitis of lower extremity,  unspecified laterality           Plan     Admission  The patient is to be admitted to medicine for further management of cellulitis. The patient will continue to be monitored here in the ED until the time at which they are moved to their new treatment location.Workup, treatment and diagnosis were discussed with the patient and/or family members; the patient agrees to the plan and all questions were addressed and answered.    This note was dictated using voice-recognition software, which occasionally leads to inadvertent typographic errors.      Bronson Ing, MD, MD  PGY-1 Emergency Medicine

## 2023-10-02 NOTE — ED Notes (Signed)
Pulses assessed via palpation and doppler on RLE, doppler only on LLE

## 2023-10-02 NOTE — ED Notes (Signed)
Pt complaining of chest pain and feels like his heart is fluttering. EKG ordered

## 2023-10-02 NOTE — Plan of Care (Signed)
Hospital Medicine Attending Supervision Note    Alexander [name] was seen today with the resident physician. I personally interviewed and examined the patient. I reviewed the documentation by the resident and agree as documented unless otherwise stated below.    Reason for today's visit: Cellulitis of left lower extremity      Supplemental History / ROS  Alexander [name] is a 37 yo M with PMH HIV on ART, substance use disorder, esophageal candidiasis, HCV who presents with left leg pain and redness. The patient was recently admitted to [facility] from 10/9-10/14 with left leg cellulitis. He was treated with broad spectrum IV antibiotics with significant improvement in symptoms, discharged on PO antibiotics. That admission he also underwent an EGD for odynophagia, found to have esophageal candidiasis and started on fluconazole. He reports taking all of his medications as prescribed, including completion of his antibiotics. He is unsure exactly when they ended, but he finished the bottles. He reported improvement in his left leg redness. About three days ago, he tripped on a root and fell on his left knee. He denied any popping, instability. Shortly thereafter his left leg started to get red and painful again, similar to his prior presentation. He currently reports difficulty walking on the knee although pain is a little better than yesterday. He is having opioid withdrawal symptoms, including general aches and pains, stomach cramping, nausea, and anxiety. He reports intermittent sharp chest pain that has been going on for a while, no shortness of breath. No further odynophagia. He has been using ~2g fentanyl daily, injects into his arms but not his legs. He has not been drinking ETOH since last hospital discharge.    Supplemental Exam:  Gen - alert, no distress, chronically ill-appearing  Eyes - normal conjunctiva, normal pupils  ENT - moist mucosa, poor dentition, no visible thrush  Neck - supple, trachea midline  CV - RRR, no  MRG, L>R edema to mid-shin  Lung - CTA, normal WOB  Abd - soft, mildly TTP diffusely  MSK - L knee swollen but not erythematous, TTP but no focal point tenderness, able to fully extend knee but flexion limited  Skin - healed scab on LLE, erythema extending to just below the knee  Neuro - alert, oriented and answers questions appropriately. No facial asymmetry. Moves all four extremities  Psych - normal mood, normal behavior      Medical Decision Making:  // LEVEL 3 HIGH  Acute or chronic illness that may pose threat to life or function  Drug Tx requiring intensive monitoring (physiologic, electrolytes, renal function, drug levels, etc): IV vancomycin      Assessment & Plan     Alexander [name] is a 37 y.o. male on hospital day 0.  The medical issues being addressed in today's encounter are as follows:    Principal Problem:    Cellulitis of left lower extremity  Active Problems:    Polysubstance abuse (CMS-HCC)    HIV infection    Chronic hepatitis C without hepatic coma (CMS-HCC)    Opioid use disorder    #LLE cellulitis: recent admission for same, improved with antibiotics but subsequently worsened outpatient.  -Start vanc/cefepime, monitor for clinical improvement  -Blood cultures ordered  -Schedule Tylenol with PRN oxycodone. Can increase oxycodone dose if needed    #LE edema: 1+ bilateral. Concern for cellulitis in LLE as above, but RLE without erythema. Could be side effect of amlodipine but patient has been on this medication for a while.  -Check  Duplex, TTE    #L knee pain: 2/2 mechanical fall. Has effusion on exam but not erythematous or significantly TTP so does not appear to be consistent with septic joint. Initial X-ray with questionable tibial fracture, not demonstrated on repeat.  -Lidocaine patch  -Pain medications as above  -PT/OT ordered  -Outpatient ortho referral    #HIV: last CD4 earlier this month 295, vital load undetectable.  -Continue ART    #Esophageal candidiasis: diagnosed last  admission.  -Continue fluconazole through 11/1 (documented stop date)    #Opioid use disorder: reporting active withdrawal.  -Addiction consulted, appreciate recommendations  -Started methadone 40 mg with additional PRN for elevated COWS, increase to 50 mg tomorrow AM  -Schedule clonidine with BP hold parameters    #Hyponatremia: mild, likely hypovolemic.  -Give IVF    #Abnormal hepatic panel: has known hepatitis C with positive viral load, has chronically elevated AST>ALT, mildly elevated bilirubin. RUQ ultrasound last admission normal.   -Monitor      Alexander Bicknell Vernia Buff, MD  Attending Physician  Div[facility] of [facility]  Department of Internal Medicine  10/02/2023 9:41 PM

## 2023-10-02 NOTE — ED Notes (Signed)
1st set of peripheral serum cultures located in main lab and will be sent to micro for processing.

## 2023-10-02 NOTE — ED Notes (Signed)
Meal tray provided.

## 2023-10-02 NOTE — ED Notes (Signed)
Pt given ice water per request.

## 2023-10-02 NOTE — Progress Notes (Addendum)
The following clinical information related to substance use is CONFIDENTIAL and protected by [employer]. ACCESS TO THIS INFORMATION IS ON A NEED-TO-KNOW BASIS ONLY AND IS PROVIDED FOR THE PURPOSE OF ASSURING APPROPRIATE MEDICAL CARE. Federal regulations (42 CFR, Part 2) prohibit the release of this information without specific written consent of the patient. A general authorization for the release of medical information is NOT sufficient for the purpose of releasing the following information.     INPATIENT INITIAL ADDICTION MEDICINE CONSULT NOTE   Author: Ellison Hughs, MD  PCP: Herbert Pun, MD  Hospital Day: 0  Admit Date: 10/01/2023     Consult requested by: Primary team    REASON FOR CONSULTATION: OUD at risk for withdrawal    HPI:   Alexander [name] is a 37 y.o. male with PMH of HIV, HVC, IV SUD, severe EtOH use disorder with prior severe withdrawal and cellulitis who presented on 10/28 for worsening of bilateral lower extremity cellulitis.    Patient recently admitted to [facility] 10/9 through 10/14 for left lower extremity cellulitis.  At the time of discharge he was sent out with acamprosate to help with EtOH cravings and while in the hospital he was treated with methadone 50 mg for AUD.  This was noted to be a reduced dose due to interaction with fluconazole which she was also receiving at the time for candidal esophagitis.    Today, patient shares that he did not follow-up with [facility] clinic at discharge as planned following to 10/14 admission because he has been struggling with homelessness and transportation.  He also shares that he would like to stop substance misuse.  Since his discharge on 10/14 he has had only one half of a can of beer.  Previously he was drinking 1 full case (24) of beer daily.  He believes the medicine a acamprosate has helped with this and also the fact that he just does not want to drink anymore.    He shares that his last IV fentanyl use was yesterday and it was a little over 1 g.   States he usually takes IV 2 g daily.  He states that fentanyl helps him by taking away the pain.  He says this is both physical pain and emotional pain. He has tried medications prescribed by a doctor for anxiety and depression in the past and these did not work well enough for him.    He denies other substance use including no use of marijuana or amphetamines.  At this moment he does feel like he is withdrawing and is experiencing pain in his lower extremities due to the cellulitis and nausea.    Injection Drug use: yes  Opioid Overdose in the last year:  no (did in 2015)    Signs of opioid use disorder in the last 12 months:  1) Recurrent use resulting in failure to fulfill major role obligations  yes  2) Recurrent use in physically hazardous situations  yes  3) Continued use despite recurrent social or interpersonal problems exacerbated by substance  yes  4) Tolerance:  yes  5) Withdrawal: yes  6) Taking substance in larger amounts and longer than intended:  yes  7) Having a persistent desire or unsuccessful effort to cut down or control use:   yes  8) Spending a great deal of time to obtain substance or recover from its effects:   not reviewed  9) Giving up important social, occupational, or recreational activities:  yes  10) Using the  substance despite knowledge of having a persistent physical or psychological problem that has been caused by the substance:  yes  11) Craving  yes  TOTAL = at least 10  2-3 = mild, 4-5 = moderate, >6 severe    OTHER SUBSTANCE USE HISTORY:  Past Tobacco use: yes  Current Tobacco use:  yes - ~1.5 ppd  Interested in quitting?  No - not quite at this moment ready to quit but working opn it -- gum and lozenges help  Past Etoh use:  yes - over a case of beer daily   Current Etoh use:  no - had 1/2 beer since last here  Past Illicit Drug use: yes  Current Illicit Drug use:  yes  Family Hx of substance use disorder:  yes  Active Substance use in household:  n/a  History of substance  use disorder treatment: no   Where and when? [facility]- there for about 1 year and kicked him out bc couldnn't quit drinking  Hx of DUI?  Yes - 2015  Hx of incarceration/probation?  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, Engineer, drilling:     History of Trauma: yes    ROS:  Withdrawal symptoms currently:  Chills/sweats Yes  N/V/D Yes  Muscle/joint aches Yes  Irritability Yes  Anxiety Yes  Depression Yes    Hx of Mental Illness: yes, anxiety and depression    PMH:  Past Medical History:   Diagnosis Date    Anxiety     Bipolar disorder (CMS-HCC)     Bronchitis     Depression     Drug abuse and dependence (CMS-HCC)     H/O tooth extraction     Hepatitis C     HIV infection (CMS-HCC)     Liver disease Hep C    PTSD (post-traumatic stress disorder)     Substance induced mood disorder (CMS-HCC)     Thyroid disease        INPATIENT MEDS:    Current Facility-Administered Medications:     abacavir-dolutegravir-lamivud (TRIUMEQ) 600-50-300 mg tablet 1 tablet, 1 tablet, Oral, Daily 0900, Kavya Akella, DO    acetaminophen (TYLENOL) tablet 650 mg, 650 mg, Oral, Q4H PRN, Kavya Akella, DO    amLODIPine (NORVASC) tablet 5 mg, 5 mg, Oral, Daily 0900, Kavya Akella, DO, 5 mg at 10/02/23 1138    cefepime (MAXIPIME) 2 g in sodium chloride 0.9% 100 mL IVPB ADDaptor, 2 g, Intravenous, Q12H, Kavya Akella, DO    cloNIDine HCL (CATAPRES) tablet 0.1 mg, 0.1 mg, Oral, Q12H PRN, Kavya Akella, DO    dextrose 10%-water (D10W) IV soln, 12.5 g, Intravenous, Q15 Min PRN **OR** dextrose 10%-water (D10W) IV soln, 25 g, Intravenous, Q15 Min PRN, Kavya Akella, DO    enoxaparin (LOVENOX) syringe 40 mg/0.4 mL, 40 mg, Subcutaneous, Daily 0900, Kavya Akella, DO, 40 mg at 10/02/23 1138    glucose chewable tablet 12 g, 12 g, Oral, Q15 Min PRN, Kavya Akella, DO    ibuprofen (MOTRIN) tablet 400 mg, 400 mg, Oral, Q6H PRN, Kavya Akella, DO    loperamide (IMODIUM) capsule 2 mg, 2 mg, Oral, 4x Daily PRN, Kavya Akella, DO    methadone (DOLOPHINE) 10 mg/mL oral concentrate 40 mg, 40 mg, Oral,  Once PRN **AND** methadone (DOLOPHINE) 10 mg/mL oral concentrate 5 mg, 5 mg, Oral, Q4H PRN, Kavya Akella, DO    nicotine (NICODERM CQ) 21 mg/24 hr 1 patch, 1 patch, Transdermal, Daily 0900, Kavya Akella, DO, 1 patch at 10/02/23 1138  nicotine (polacrilex) (NICORETTE) gum 4 mg, 4 mg, Buccal, Q1H PRN, Kavya Akella, DO    ondansetron (ZOFRAN) tablet 4 mg, 4 mg, Oral, Q6H PRN, Kavya Akella, DO    [START ON 10/03/2023] pantoprazole (PROTONIX) EC tablet 40 mg, 40 mg, Oral, QAM AC, Kavya Akella, DO    vancomycin (VANCOCIN) 750 mg in sodium chloride 0.9 % 150 mL IVPB, 15 mg/kg, Intravenous, Q8H, Kavya Akella, DO, Last Rate: 150 mL/hr at 10/02/23 1135, 750 mg at 10/02/23 1135    Current Outpatient Medications:     abacavir-dolutegravir-lamivud (TRIUMEQ) 600-50-300 mg Tab, Take 1 tablet by mouth daily., Disp: 30 tablet, Rfl: 2    acamprosate (CAMPRAL) 333 mg tablet, Take 2 tablets (666 mg total) by mouth 3 times a day., Disp: 180 tablet, Rfl: 0    albuterol 90 mcg/actuation Inhl inhaler, Inhale 1 puff into the lungs every 6 hours as needed., Disp: 18 g, Rfl: 0    amLODIPine (NORVASC) 5 MG tablet, Take 1 tablet (5 mg total) by mouth daily., Disp: 30 tablet, Rfl: 0    fluconazole (DIFLUCAN) 100 MG tablet, Take 1 tablet (100 mg total) by mouth daily for 17 days., Disp: 17 tablet, Rfl: 0    gabapentin (NEURONTIN) 300 MG capsule, Take 1 capsule (300 mg total) by mouth 3 times a day., Disp: 90 capsule, Rfl: 1    methocarbamoL (ROBAXIN) 500 MG tablet, Take 1 tablet (500 mg total) by mouth 4 times daily before meals and at bedtime., Disp: 120 tablet, Rfl: 1    nicotine polacrilex (NICORETTE) 4 MG gum, Place 1 each (4 mg total) inside cheek every hour as needed for nicotine cravings., Disp: 110 each, Rfl: 0    nystatin (MYCOSTATIN) 100,000 unit/mL suspension, Swish and swallow 5 mLs (500,000 Units total) 4 times a day., Disp: 473 mL, Rfl: 0    pantoprazole (PROTONIX) 40 MG tablet, Take 1 tablet (40 mg total) by mouth every morning  before breakfast., Disp: 30 tablet, Rfl: 0    naloxone (NARCAN) 4 mg/actuation Spry, Apply 1 spray in one nostril if needed. Call 911. May repeat dose in other nostril if no response in 3 minutes., Disp: 2 each, Rfl: 0    has no known drug allergies or adverse reactions.    SOCIAL HX:  Address: [address]  HOM[place]  [place] [address]  Homeless: Yes  Has housing vouchers.   Never stayed at a residential facility for recovery.   Current Legal issues: No  Dad and 2 brothers are deceased. Mom and sister live in [place]. No other supports here except his 2 dogs.    PHYSICAL EXAM:  BP 131/83 (BP Location: Right upper arm, Patient Position: Lying, BP Cuff Size: Small)   Pulse 73   Temp 98.2 F (36.8 C) (Oral)   Resp 12   Wt 116 lb 13.5 oz (53 kg)   SpO2 100%   BMI 17.25 kg/m   General Appearance: pleasant, engaged, NAD but appears uncomfortable  HEENT: clear sclera, EOMI, clear speech  Thorax: Normal respiratory effort, talks in full sentences  Abdomen: Non-distended  Extremitites: Moving spontaneously  Musculoskeletal: Moves all extremities spontaneously  Skin: No jaundice, warm, intact  Psych: linear thoughts, pleasant  Neuro: Alert, mild tremor     DATA:  Last Drug Screen: +10/10 and positive for barbiturates cocaine oxycodone and fentanyl    UDS available for review: No - not yet for this admission; Positive for:   []  benzodiazepines, []  barbiturates, []  cocaine, []  THC, []   amphetamines,   []  methamphetamines, []  opiates, []  oxycodone, []  fentanyl, []  methadone,  []  buprenorphine, []  MDMA, []  PCP    Complete Blood Count:    Recent Labs     10/02/23  0130   WBC 6.3   HGB 10.9*   HCT 33.0*   MCV 80.9   PLT 94*       Basic Metabolic Panel:    Recent Labs     10/02/23  0130   NA 132*   K 3.5   CL 99   CO2 26   BUN 7       Hepatic Panel:    Recent Labs     10/02/23  0130   AST 104*   ALT 48   ALBUMIN 3.1*        __________________________________________________   IMPRESSION:  Alexander [name] is a 37  y.o. male with PMH of HIV, HVC, IV SUD, severe EtOH use disorder with prior severe withdrawal and cellulitis who presents with bilateral lower extremity cellulitis.  The addiction team was consulted for management of opioid use disorder high risk for withdrawal.  He would like to restart methadone and connect to care.Reports cutting back signficiantly on his alcohol use.     Social Issues Addressed/Barriers to Care: Homelessness, transportation    Diagnosis:  1. Severe Opioid Use Disorder  2. Severe Alcohol Use Disorder   3. Nicotine Dependence    Patient history is consistent with severe opioid use disorder.  He expresses motivation to completely cease opioid use.  He is interested in reestablishing care with [facility] clinic. Fortunately he has recently made progress in this regard, though he still meets criteria for severe alcohol use disorder (will be considered in remission after 3 months of abstinence).  Given 2 g of IV fentanyl use daily patient does have a high tolerance and thus we are inclined to more rapidly escalate his methadone dose so that he receives up to a total of 60 mg of methadone today and will start with 50 mg of methadone tomorrow morning with further PRNs to be established tomorrow.  Patient is not interested in discussing residential possibilities for opioid management now but is open to discussing this in the future once his pain and current condition has improved.    Discussed risks, benefits, and options for treatment, expectations for treatment, and limitations of confidentiality. Provided psychoeducation about opioid use disorder, use of medications, effect of substances on mental and physical health, use of 12-step or other peer support groups and techniques for relapse prevention.      RECOMMENDATIONS:  - obtain urine drug test   - agree with methadone 40 mg provided earlier today  - methadone 10 mg q4hr prn x2 doses for COWS>8  (max daily dose 60mg  TDD)  - tomorrow: methadone 50 mg in AM    - oxycodone 15 mg every 4 hours prn pain (okay to increase per primary team if pain remains uncontrolled)  - ok to continue home acamprosate 666 mg 3 times daily  - NRTs (patient prefers gums and lozenges)  - clonidine 0.1 mg q6 hrs hold if SBP < 100  - optimize multimodal pain regimen as able (could consider scheduling Tylenol or ibuprofenfor pain due to cellulitis)  - will discuss possibility of residential recovery options in days to come  - intranasal naloxone at discharge    THESE RECOMMENDATIONS ARE PRELIMINARY UNTIL SIGNED BY THE ATTENDING    Ellison Hughs, MD, MPH  she, her, hers  Internal Medicine & Pediatrics, PGY-4  (p): [phone]       I have personally seen and evaluated the patient face-to-face and I performed key elements of the history and exam. Patient discussed in rounds including the treatment plan and course of action. I agree with the documented history, exam, and treatment plan stated ,  I formulated the plan with the resident and with the treatment team, agree with the resident's  documentation of Alexander [name]    Agree with plan to restart MMT. I evaluated him after he had received the 40mg  w/ continued opioid w/d symptoms, can have an addition 2x doses PRN, uses 2 grams of fentanyl IV so high tolerance.  Agree with full agonist opioids for pain and w/d, as well as scheduling clonidine. Encouraged pt on cutting down on alcohol use!

## 2023-10-02 NOTE — ED Notes (Signed)
Pt states he believes he is starting to withdrawal. Pt has piloerection of skin, severe pain, nauseous, and has tremors. Team paged.

## 2023-10-03 ENCOUNTER — Inpatient Hospital Stay: Admit: 2023-10-03 | Discharge: 2023-10-09 | Payer: PRIVATE HEALTH INSURANCE

## 2023-10-03 ENCOUNTER — Inpatient Hospital Stay: Admit: 2023-10-03 | Payer: PRIVATE HEALTH INSURANCE

## 2023-10-03 LAB — URINE DRUG SCREEN WITHOUT CONFIRMATION, STAT
Amphetamine, 500 ng/mL Cutoff: NEGATIVE
Barbiturates UR, 300  ng/mL Cutoff: NEGATIVE
Benzodiazepines UR, 300 ng/mL Cutoff: NEGATIVE
Buprenorphine, 5 ng/mL Cutoff: NEGATIVE
Cocaine UR, 300 ng/mL Cutoff: POSITIVE — AB
Fentanyl, 2 ng/mL Cutoff: POSITIVE — AB
Methadone, UR, 300 ng/mL Cutoff: POSITIVE — AB
Opiates UR, 300 ng/mL Cutoff: POSITIVE — AB
Oxycodone, 100 ng/mL Cutoff: POSITIVE — AB
THC UR, 50 ng/mL Cutoff: NEGATIVE
Tricyclic Antidepressants, 300 ng/mL Cutoff: NEGATIVE

## 2023-10-03 LAB — RENAL FUNCTION PANEL W/EGFR
Albumin: 2.7 g/dL — ABNORMAL LOW (ref 3.5–5.7)
Anion Gap: 7 mmol/L (ref 3–16)
BUN: 3 mg/dL — ABNORMAL LOW (ref 7–25)
CO2: 26 mmol/L (ref 21–33)
Calcium: 8.3 mg/dL — ABNORMAL LOW (ref 8.6–10.3)
Chloride: 103 mmol/L (ref 98–110)
Creatinine: 0.49 mg/dL — ABNORMAL LOW (ref 0.60–1.30)
EGFR: >90
Glucose: 91 mg/dL (ref 70–100)
Osmolality, Calculated: 278 mosm/kg (ref 278–305)
Phosphorus: 3.5 mg/dL (ref 2.1–4.7)
Potassium: 3.5 mmol/L (ref 3.5–5.3)
Sodium: 136 mmol/L (ref 133–146)

## 2023-10-03 LAB — HEPATIC FUNCTION PANEL
ALT: 47 U/L (ref 7–52)
AST: 95 U/L — ABNORMAL HIGH (ref 13–39)
Albumin: 2.7 g/dL — ABNORMAL LOW (ref 3.5–5.7)
Alkaline Phosphatase: 139 U/L — ABNORMAL HIGH (ref 36–125)
Bilirubin, Direct: 0.52 mg/dL — ABNORMAL HIGH (ref 0.00–0.40)
Bilirubin, Indirect: 0.48 mg/dL (ref 0.00–1.10)
Total Bilirubin: 1 mg/dL (ref 0.0–1.5)
Total Protein: 7.4 g/dL (ref 6.4–8.9)

## 2023-10-03 LAB — CBC
Hematocrit: 31.6 % — ABNORMAL LOW (ref 38.5–50.0)
Hemoglobin: 10.7 g/dL — ABNORMAL LOW (ref 13.2–17.1)
MCH: 26.9 pg — ABNORMAL LOW (ref 27.0–33.0)
MCHC: 33.9 g/dL (ref 32.0–36.0)
MCV: 79.5 fL — ABNORMAL LOW (ref 80.0–100.0)
MPV: 9.5 fL (ref 7.5–11.5)
Platelets: 70 10*3/uL — ABNORMAL LOW (ref 140–400)
RBC: 3.97 10*6/uL — ABNORMAL LOW (ref 4.20–5.80)
RDW: 15.4 % — ABNORMAL HIGH (ref 11.0–15.0)
WBC: 3.4 10*3/uL — ABNORMAL LOW (ref 3.8–10.8)

## 2023-10-03 LAB — MAGNESIUM: Magnesium: 1.6 mg/dL (ref 1.5–2.5)

## 2023-10-03 LAB — VANCOMYCIN, TROUGH: Vancomycin Tr: 8.1 ug/mL — ABNORMAL LOW (ref 10.0–20.0)

## 2023-10-03 MED ORDER — vancomycin (VANCOCIN) 1,000 mg in sodium chloride 0.9% 250 mL ADDaptor IVPB
Freq: Three times a day (TID) | INTRAVENOUS | Status: AC
Start: 2023-10-03 — End: 2023-10-04
  Administered 2023-10-03 – 2023-10-04 (×3): via INTRAVENOUS

## 2023-10-03 MED ORDER — methadone (DOLOPHINE) 10 mg/mL oral concentrate 60 mg
10 | Freq: Every day | ORAL
Start: 2023-10-03 — End: 2023-10-04
  Administered 2023-10-04: 14:00:00 via ORAL

## 2023-10-03 MED ORDER — magnesium sulfate in sterile water 50 mL IVPB 2 g
2 | Freq: Once | INTRAVENOUS | Status: AC
Start: 2023-10-03 — End: 2023-10-03
  Administered 2023-10-03: 13:00:00 via INTRAVENOUS

## 2023-10-03 MED ORDER — potassium chloride (KLOR-CON M20) CR tablet 40 mEq
20 | Freq: Once | ORAL | Status: AC
Start: 2023-10-03 — End: 2023-10-03
  Administered 2023-10-03: 13:00:00 via ORAL

## 2023-10-03 MED ORDER — acamprosate (CAMPRAL) tablet 666 mg
333 | Freq: Three times a day (TID) | ORAL
Start: 2023-10-03 — End: 2023-10-03
  Administered 2023-10-03: 17:00:00 via ORAL

## 2023-10-03 MED FILL — OXYCODONE 15 MG TABLET: 15 15 MG | ORAL | Qty: 1

## 2023-10-03 MED FILL — MAGNESIUM SULFATE 2 GRAM/50 ML (4 %) IN WATER INTRAVENOUS PIGGYBACK: 2 2 gram/50 mL (4 %) | INTRAVENOUS | Qty: 50

## 2023-10-03 MED FILL — PANTOPRAZOLE 40 MG TABLET,DELAYED RELEASE: 40 40 MG | ORAL | Qty: 1

## 2023-10-03 MED FILL — CEFEPIME 2 GRAM SOLUTION FOR INJECTION: 2 2 gram | INTRAMUSCULAR | Qty: 1

## 2023-10-03 MED FILL — NICOTINE 21 MG/24 HR DAILY TRANSDERMAL PATCH: 21 21 mg/24 hr | TRANSDERMAL | Qty: 1

## 2023-10-03 MED FILL — ONDANSETRON HCL 4 MG TABLET: 4 4 MG | ORAL | Qty: 1

## 2023-10-03 MED FILL — CLONIDINE HCL 0.2 MG TABLET: 0.2 0.2 MG | ORAL | Qty: 1

## 2023-10-03 MED FILL — AMLODIPINE 5 MG TABLET: 5 5 MG | ORAL | Qty: 1

## 2023-10-03 MED FILL — ACAMPROSATE 333 MG TABLET,DELAYED RELEASE: 333 333 mg | ORAL | Qty: 2

## 2023-10-03 MED FILL — LIDODERM 5 % TOPICAL PATCH: 5 5 % | TOPICAL | Qty: 1

## 2023-10-03 MED FILL — TRIUMEQ 600 MG-50 MG-300 MG TABLET: 600-50-300 600-50-300 mg | ORAL | Qty: 1

## 2023-10-03 MED FILL — FLUCONAZOLE 100 MG TABLET: 100 100 MG | ORAL | Qty: 1

## 2023-10-03 MED FILL — HYDROXYZINE HCL 25 MG TABLET: 25 25 MG | ORAL | Qty: 1

## 2023-10-03 MED FILL — VANCOMYCIN 1,000 MG INTRAVENOUS INJECTION: 1000 1000 mg | INTRAVENOUS | Qty: 750

## 2023-10-03 MED FILL — VANCOMYCIN 1,000 MG INTRAVENOUS INJECTION: 1000 1000 mg | INTRAVENOUS | Qty: 1000

## 2023-10-03 MED FILL — TYLENOL 325 MG TABLET: 325 325 mg | ORAL | Qty: 2

## 2023-10-03 MED FILL — POTASSIUM CHLORIDE ER 20 MEQ TABLET,EXTENDED RELEASE(PART/CRYST): 20 20 MEQ | ORAL | Qty: 2

## 2023-10-03 MED FILL — NICOTINE (POLACRILEX) 2 MG GUM: 2 2 mg | BUCCAL | Qty: 2

## 2023-10-03 MED FILL — ENOXAPARIN 40 MG/0.4 ML SUBCUTANEOUS SYRINGE: 40 40 mg/0.4 mL | SUBCUTANEOUS | Qty: 0.4

## 2023-10-03 MED FILL — METHADONE 10 MG/ML ORAL CONCENTRATE: 10 10 mg/mL | ORAL | Qty: 5

## 2023-10-03 MED FILL — METHADONE 10 MG/ML ORAL CONCENTRATE: 10 10 mg/mL | ORAL | Qty: 1

## 2023-10-03 MED FILL — IBUPROFEN 400 MG TABLET: 400 400 MG | ORAL | Qty: 1

## 2023-10-03 NOTE — Progress Notes (Signed)
This peer provided support to pt. Pt stated that he is doing ok but having a lot of cravings. Pt stated that he is starting methadone but struggling. Pt stated he plans to [facility] with [facility], this peer voiced understanding. Pt stated that he has issues with alcohol as well as fentanyl but is willing to cut back on his drinking in order to get help. This peer provided support and will continue to follow with patient.

## 2023-10-03 NOTE — Progress Notes (Signed)
[facility]: Vancomycin Monitoring Consult       Alexander [name] is a 37 y.o. male currently being treated for cellulitis.     Patient has no known drug allergies or adverse reactions.    Pharmacy consulted for vancomycin management by H3 medicine.      Current Anti-Infectives         Dose Frequency Start End    abacavir-dolutegravir-lamivud (TRIUMEQ) 600-50-300 mg tablet 1 tablet 1 tablet Daily 10/02/2023 --    Admin Instructions:  Administer with or without food.  Administer 2 hours before or 6 hours after cation-containing antacids or laxatives, sucralfate, oral supplements containing iron or calcium, or buffered medications.  Tablet may be crushed for enteral administration.    Route: Oral    cefepime (MAXIPIME) 2 g in sodium chloride 0.9% 100 mL IVPB ADDaptor 2 g Every 12 hours 10/02/2023 --    Admin Instructions: **DOUBLE CHECK LENGTH OF INFUSION BEFORE ADMINISTRATION**  Use ADDaptor product - Mix Thoroughly Before Administration    Route: Intravenous    fluconazole (DIFLUCAN) tablet 100 mg 100 mg Daily 10/03/2023 10/07/2023    Admin Instructions: DO NOT CRUSH OR CHEW.    Route: Oral    vancomycin (VANCOCIN) 1,000 mg in sodium chloride 0.9% 250 mL ADDaptor IVPB 1,000 mg Every 8 hours 10/03/2023 --    Admin Instructions: Contact pharmacy if there is a question/concern of whether vancomycin should be given based on serum drug levels.  Use ADDaptor product - Mix Thoroughly Before Administration    Route: Intravenous          documented within (last 72 hours)       Date/Time Action Medication Dose Rate    10/03/23 0841 New Bag    vancomycin (VANCOCIN) 750 mg in sodium chloride 0.9 % 150 mL IVPB 750 mg 150 mL/hr    10/02/23 1951 New Bag    vancomycin (VANCOCIN) 750 mg in sodium chloride 0.9 % 150 mL IVPB 750 mg 150 mL/hr    10/02/23 1135 New Bag    vancomycin (VANCOCIN) 750 mg in sodium chloride 0.9 % 150 mL IVPB 750 mg 150 mL/hr    10/02/23 0304 New Bag    vancomycin (VANCOCIN) 1,250 mg  in sodium chloride 0.9% 250 mL ADDaptor IVPB 1,250 mg 200 mL/hr            --Objective Data--     Vitals:    10/03/23 0704 10/03/23 0834 10/03/23 1131 10/03/23 1510   BP:  132/82 129/84 125/86   Pulse: 86 73 63 60   Resp: 16 16 16 14    Temp:  97.6 F (36.4 C) 97.3 F (36.3 C) 98 F (36.7 C)   TempSrc:  Oral Oral Oral   SpO2:  100% 100% 100%   Weight: 116 lb 13.5 oz (53 kg)      Height: 5' 9 (1.753 m)          I/O last 3 completed shifts:  In: 150 [IV Piggyback:150]  Out: 1275 [Urine:1275]    WBC, BUN, Creatinine  (Last 7 days)        Today 0603 Yesterday 1740 Yesterday 0130    WBC             3.4                       --  6.3              BUN             3                       5                       7               Creatinine             0.49                       0.48                       0.57                      Ideal body weight: 70.7 kg (155 lb 13.8 oz)  Estimated CrCl: >120 mL/min     --Cultures--     Microbiology Results       Date and Time Order Name Sensitivity Status Organisms Specimen ID Source    10/02/2023 11:05 AM #2  Blood culture-Peripheral site 2  Preliminary  [id number] Peripheral    10/02/2023  1:42 AM #1  Blood culture-Peripheral site 1  Preliminary  [id number] Peripheral            --Vancomycin Concentrations--     Lab Results  (Last 7 days)        Today 1507    Vanc Tr             8.1                         --Assessment and Plan--          Patient is a 37 y.o. male being treated with vancomycin for cellulitis.  Based on patient's serum vancomycin level, increase vancomycin to 1000 mg every 8 hours.  Will check vancomycin serum concentration prior to 4th dose of current regimen.  Goal vancomycin trough of 10-15 mg/L  Pharmacy will continue to monitor therapy for efficacy and toxicity.       Thank you for the consult.    Vinicio Lynk E. Rolando Whitby, BS, PharmD, BCPS  Clinical Pharmacy Specialist, Internal Medicine  Preferred Communication: Epic Chat  10/03/2023

## 2023-10-03 NOTE — Care Coordination-Inpatient (Signed)
[facility]  Case Management/Social Work Department  Progress Note    Patient Information     Patient Name: Alexander [name]  MRN: [id number]  Hospital day: 1  Inpatient/Observation:  Inpatient   Level of Care:  MedSurg  Admit date:  10/01/2023  Admission diagnosis: Cellulitis of lower extremity, unspecified laterality [L03.119]    PMH:  has a past medical history of Anxiety, Bipolar disorder (CMS-HCC), Bronchitis, Depression, Drug abuse and dependence (CMS-HCC), H/O tooth extraction, Hepatitis C, HIV infection (CMS-HCC), Liver disease (Hep C), PTSD (post-traumatic stress disorder), Substance induced mood disorder (CMS-HCC), and Thyroid disease.    PCP:  Herbert Pun, MD    Home Pharmacy:    [facility] [id number] - [address], [place] - [address] AT [facility] [address]  [address]  [place] [address]  Phone: [phone]     [facility]  [address] [place] [address]  Phone: [phone]     [facility] [place], [place] - [address]  [address]  [place] [address]  Phone: [phone]     [facility] [id number] [place], [place] - [address] AT [facility] [address]  [address]  [place] [address]  Phone: [phone]     [facility]  [address]  [address] [place] [address]  Phone: [phone]     [facility] [id number] [place], [place] - [address]  [address] [place] [address]  Phone: [phone]     [facility]  [address]  [place] [address]  Phone: [phone]         Medical Insurance Coverage:  Payor: Luther Redo / Plan: MOLINA / Product Type: Medicaid [place] care /     Other Pertinent Information     SW attended rounds today and completed chart review. Per rounds, patient is not medically ready. PT/OT worked with patient today, recommendation of outpatient PT. Addictions following.     SW spoke with patient regarding discharge plan. Patient shared he would be interested in receiving  script for outpatient PT. SW asked patient if he needed shelter resources, or wanted a referral to [facility]. Shelter resources provided. SW spoke with patient regarding what the [facility] is. Patient reviewed copy of [facility]'s rules, and signed. Patient was agreeable to having a referral sent, and shared he would need to talk with his friend further who is watching his dog. SW faxed over referral to [facility] [phone]). SW confirmed with Center that they accept patients who are on methadone.      Discharge Plan     Anticipated discharge plan:  Homeless/potentially [facility]     Anticipated discharge date:  10/30     CM/SW will continue to follow and remain available for discharge planning needs.      Jailah Willis MSW, LISW     Cell [phone]

## 2023-10-03 NOTE — Plan of Care (Signed)
Problem: Discharge Planning  Goal: Identify discharge needs  Outcome: Progressing  Goal: Patient's discharge needs are met  Description: Collaborate with interdisciplinary team and initiate plans and interventions as needed.   Outcome: Progressing  Goal: Discharge to appropriate level of care  Outcome: Progressing     Problem: Chronic Pain  Description: Patient's pain progressing toward patient's stated pain goal  Goal: Pt will have limited adverse effects related to chronic pain  Description: i.e. Depression, opioid induced constipation, respiratory depression  Outcome: Progressing  Goal: Patient will manage pain with the appropriate technique/intervention  Description: Assess and monitor patient's pain using appropriate pain scale. Collaborate with interdisciplinary team and initiate plan and interventions as ordered.  Re-assess patient's pain level 30-60 minutes after pain management intervention.  Outcome: Progressing

## 2023-10-03 NOTE — Progress Notes (Signed)
The following clinical information related to substance use is CONFIDENTIAL and protected by [employer]. ACCESS TO THIS INFORMATION IS ON A NEED-TO-KNOW BASIS ONLY AND IS PROVIDED FOR THE PURPOSE OF ASSURING APPROPRIATE MEDICAL CARE. Federal regulations (42 CFR, Part 2) prohibit the release of this information without specific written consent of the patient. A general authorization for the release of medical information is NOT sufficient for the purpose of releasing the following information.     Addiction Consult plan of care note       Addiction Medicine has been consulted to help with Alexander [name] 's care. Reviewed the chart, medication list, Lab and urine testing.    Recommendation as following :   -yesterday received methadone 40mg  and 2x 10mg  PRN doses (60mg  TDD; socred COWS of 5-12 overnight, PRN)  -today can received methadone 50mg  AM and can received up to Q4H 10mg  PR N for 2 doses for COWS > 8 (max dose 70mg  TDD)  -tomorrow methadone 60mg  in the AM and only 1x PRN methadone 10mg  for COWS > 8  (max dose 70mg  TDD)  -consider medical respite if possibility for discharge  -new methadone patient, needs to establish at [facility].    Please reach out if need additional help/resources .    Len Blalock, MD  Addiction Medicine/ Internist   Behavioral and Addiction sciences Department   [facility].

## 2023-10-03 NOTE — Progress Notes (Signed)
Occupational Therapy  Initial Assessment/DC     Name: Alexander [name]  DOB: Jul 21, 1986  Attending Physician: Lyman Speller, MD  Admission Diagnosis: Cellulitis of lower extremity, unspecified laterality [L03.119]  Date: 10/03/2023  Room: 1610/R6045  Reviewed Pertinent hospital course: Yes    Hospital Course PT/OT: 37 y/o male presenting with worsening LLE cellulitis (admission and DC 10/14 with antibiotics) and recent mechanical fall on L knee. 10/28: L knee XR- moderate size L knee joint effusion, previously seen cortical irregularity of the lateral tibial plateau corresponds to a small marginal osteophyte in the current views, no definite fracture. 10/29: Pending LLE doppler  Relevant PMH : HIV, substance use disorder, esophageal candidiasis, HCV  Precautions: none  Activity Level: Activity as tolerated    Assist: Co-evaluation performed      Recommendation  Recommendation: No skilled OT  Equipment Recommendations: None      Assessment/Goals/Plan  Pt reports no concerns re: ADLs, hoping pain will resolved. Pt with no skilled acute occupational therapy needs, therefore no occupational therapy goals were established. Discharge patient from inpatient occupational therapy.       Pt stated goal to decrease pain during mobility.        Outcome Measures  AM-PAC 6 Clicks Daily Activity Inpatient Short Form: OT 6 Clicks Score: 24    Home Living/Prior Function  Patient able to provide accurate information at this time: Yes  Assistance available: none  Type of Home: Homeless    Prior Function  ADL Assistance: Independent  IADL Assistance: Independent     Pain  Pain Score: 8   Pain Location: Back  Pain Descriptors: Aching (legs)  Pain Intervention(s): Repositioned    Cognition  Overall Cognitive Status: Within Functional Limits  Cognitive Assessment: Arousal/ Alertness;Behavior;Following Commands;Safety Judgment;Insight;Communication  Arousal/Alertness: Alert  Orientation Level: Oriented X4  Behavior:  Appropriate;Cooperative  Following Commands: Follows all commands and directions without difficulty  Safety Judgment: Good awareness of safety precautions  Insight: Demonstrated intact insight into limitation and abilities to complete ADL's safely  Communication: Verbalization    Vision  Hearing: No hearing deficits noted  Baseline Vision: Wears glasses all the time  Overall Vision/ Perception: Within Functional Limits       Right Upper Extremity   Right UE ROM: Grossly WFL as observed during functional activities  Right UE Strength: Grossly WFL (at least 3+/5) as observed during functional activities  Right Hand Function: Grossly WFL as observed during functional activity       Left Upper Extremity  Left UE ROM: Grossly WFL as observed during functional activities  Left UE Strength: Grossly WFL (at least 3+/5) as observed during functional activities  Left UE Hand Function: Grossly WFL as observed during functional activites       Neuromuscular  Overall Sensation: Impaired  Impairments: Light touch  Light Touch: RLE partial deficits;LLE partial deficits  Additional Comments: Pt reports tingling tops of feet          Functional Mobility  Bed Mobility   Supine to Sit: Independent  Sit to Supine: Independent  Transfers  Sit to Stand: Independent  Functional Mobility: Independent  Unable to progress further due to: Pt completed functional household distance with increased time and flexed posture. Pt reports posture baseline 2/2 back pain. Limited distance this date 2/2 pain in LEs and back.  Balance  Sitting - Static: Independent  Sitting-Dynamic: Independent   Standing-Static: Independent  Standing-Dynamic: Independent    Gait belt used: Yes    ADL  Lower  Body Dressing: Independent  Toileting Deficit Additional Comments: Pt reports no concerns.         Position after Treatment/Safety Handoff  Position after therapy session: Bed  Details: RN notified;Call light/ needs within reach  Alarms: Bed  Alarms Status: Activated  and Interfaced with call system    Plan  Plan  OT Frequency: One-time visit--discharge from OT    The plan of care and recommendations assesses the patient's and/or caregiver's readiness, willingness, and ability to provide or support functional mobility and ADL tasks as needed upon discharge.    Goals       Patient/Family Education  Educated patient on the role of occupational therapy, OT goals, OT plan of care, discharge recommendation, ADL training, functional mobility training, and the importance of safety and fall prevention strategies including need for supervision/ assistance with OOB activity and use of call light. patient  verbalized understanding and demonstrated understanding.    OT Time  Start Time: 0816  Stop Time: 0829  Time Calculation (min): 13 min    OT Charges  $OT Evaluation Low Complex 30 Min: 1 Procedure              Problem List  Patient Active Problem List   Diagnosis    Foreign body in cornea    Other, mixed, or unspecified nondependent drug abuse, unspecified (CMS-HCC)    Drug-induced mood disorder (CMS-HCC)    Bipolar affective disorder, depressed (CMS-HCC)    Severe bipolar I disorder, current or most recent episode depressed (CMS-HCC)    Mood disorder (CMS-HCC)    Bipolar I disorder, most recent episode depressed (CMS-HCC)    Tylenol overdose    Bipolar disorder (CMS-HCC)    Alcohol abuse    Polysubstance abuse (CMS-HCC)    Persistent mood disorder (CMS-HCC)    HIV infection    Chronic hepatitis C without hepatic coma (CMS-HCC)    Suicidal ideation    Elevated liver enzymes    Peripheral polyneuropathy    Hematospermia    Alcohol withdrawal (CMS-HCC)    Cellulitis    Opioid use disorder    Cough with hemoptysis    Transaminitis    Cellulitis of left lower extremity    Angular cheilitis    Odynophagia      Past Medical History  Past Medical History:   Diagnosis Date    Anxiety     Bipolar disorder (CMS-HCC)     Bronchitis     Depression     Drug abuse and dependence (CMS-HCC)     H/O tooth  extraction     Hepatitis C     HIV infection (CMS-HCC)     Liver disease Hep C    PTSD (post-traumatic stress disorder)     Substance induced mood disorder (CMS-HCC)     Thyroid disease      Past Surgical History  Past Surgical History:   Procedure Laterality Date    ESOPHAGOGASTRODUODENOSCOPY N/A 09/15/2023    Procedure: EGD WITH BIOPSY;  Surgeon: Royetta Asal, MD;  Location: [facility];  Service: Gastroenterology;  Laterality: N/A;    MANDIBLE SURGERY      OPEN REDUCTION INTERNAL FIXATION MANDIBLE Bilateral 01/27/2021    Procedure: OPEN REDUCTION INTERNAL FIXATION MANDIBLE, MAXILLOMANDIBULAR FIXATION;  Surgeon: Ellery Plunk, MD;  Location: [facility] OR;  Service: ENT;  Laterality: Bilateral;

## 2023-10-03 NOTE — Progress Notes (Signed)
Hospital Medicine Progress Note   // [facility]      Chief Complaint / Reason for Follow-Up     Alexander [name] is a 37 y.o. male on hospital day 1.  The principal reason for today's follow up visit is Cellulitis of left lower extremity.      Interval History and ROS     Still having leg pain but improving  Is homeless, but wants to talk to SW about options        Medications     Scheduled Meds:   abacavir-dolutegravir-lamivud  1 tablet Oral Daily 0900    acamprosate  666 mg Oral TID    acetaminophen  650 mg Oral Q8H    amLODIPine  5 mg Oral Daily 0900    ceFEPime (MAXIPIME) IV extended infusion  2 g Intravenous Q12H    cloNIDine HCL  0.1 mg Oral Q6H    enoxaparin  40 mg Subcutaneous Daily 0900    fluconazole  100 mg Oral Daily 0900    lidocaine  1 patch Transdermal Daily 0900    methadone  50 mg Oral Daily 0900    nicotine  1 patch Transdermal Daily 0900    pantoprazole  40 mg Oral QAM AC    vancomycin  15 mg/kg Intravenous Q8H     Continuous Infusions:  PRN Meds:dextrose 10% in water **OR** dextrose 10% in water, glucose, hydrOXYzine HCL, ibuprofen, loperamide, nicotine polacrilex, ondansetron, oxyCODONE      Vital Signs     Temp:  [97.3 F (36.3 C)-98.1 F (36.7 C)] 97.3 F (36.3 C)  Heart Rate:  [63-95] 63  Resp:  [11-16] 16  BP: (125-154)/(75-101) 129/84    Intake/Output Summary (Last 24 hours) at 10/03/2023 1203  Last data filed at 10/03/2023 1131  Gross per 24 hour   Intake 268 ml   Output 2025 ml   Net -1757 ml         Physical Exam     Gen - alert, no distress, poor dentition  CV - RRR, no MRG, no elevated JVP  Lung - CTA, normal WOB  Abd - +BS, soft nt/nd, no organomegaly  MSK - Left knee swelling, tender with passive ROM, erythema on shin area improving., has old scab over left shin  Skin - normal temp, no rashes  Neuro - alert, oriented to self/place/time/situation, no facial asymmetry, no gross focal weakness, sensation grossly intact to light touch  Psych - normal mood, normal  behavior      Laboratory Data         Lab 10/03/23  0603 10/02/23  0130   WBC 3.4* 6.3   HEMOGLOBIN 10.7* 10.9*   HEMATOCRIT 31.6* 33.0*   MEAN CORPUSCULAR VOLUME 79.5* 80.9   PLATELETS 70* 94*           Lab 10/03/23  0603 10/02/23  1740 10/02/23  0130   SODIUM 136 132* 132*   POTASSIUM 3.5 4.0 3.5   CHLORIDE 103 102 99   CO2 26 25 26    BUN 3* 5* 7   CREATININE 0.49* 0.48* 0.57*   GLUCOSE 91 99 113*           Lab 10/03/23  0603 10/02/23  1740 10/02/23  0130   CALCIUM 8.3* 8.2* 8.6   MAGNESIUM 1.6  --   --    PHOSPHORUS 3.5 3.4  --  Lab 10/03/23  0603 10/02/23  1740 10/02/23  0130   ALT 47  --  48   AST 95*  --  104*   ALK PHOS 139*  --  143*   BILIRUBIN TOTAL 1.0  --  0.9   BILIRUBIN DIRECT 0.52*  --  0.6*   ALBUMIN 2.7*  --  3.1*   ALBUMINKID 2.7* 2.8*  --              Invalid input(s): WBCCAST, GRANCAST        Lab 10/02/23  1740   CK TOTAL 48       Lab Results   Component Value Date    BNP 121 (H) 09/13/2023    BNP 204 (H) 01/24/2021       Lab Results   Component Value Date    TSH 1.21 10/17/2017    FREET4 1.08 11/14/2013                 Test Results     Echo 2D Complete (TTE)    Result Date: 10/03/2023                   [facility]*                             [address]                             [place], [place] [address]                                 [phone] Transthoracic Echocardiogram Patient:       Alexander [name], Alexander [name]       Room:   6213       Height: 69in MR Number:     [id number]               DOB:    1986/09/08 Weight: 116lb Account:       [id number]             Gender: M          BP:     132 / 82 Study Date:    10/03/2023             Age:    37         BSA:    1.10m^2 Referring physician:    Luevenia Maxin Interpreting physician: Sula Rumple   PERFORMING   [place] Charlett Nose  SONOGRAPHER  Edgar Frisk, RDCS  ORDERING     Rayford Halsted    Julianne Handler  ADMITTING    Luevenia Maxin  ---------------------------------------------------------------------------- Procedure:ECHO 2D COMPLETE (TTE)   Order: Accession Number:[id number] ---------------------------------------------------------------------------- Indications:      Edema, peripheral (R60.0). ---------------------------------------------------------------------------- PMH:  No prior cardiac history. ---------------------------------------------------------------------------- Study data:  Height: 69in. 175.3cm. Weight: 116lb. 52.6kg. No prior study was available for comparison.  Study status:  Routine.  Procedure:  A transthoracic echocardiogram was performed. Image quality was good. The study was technically limited due to poor patient compliance. Scanning was performed from the parasternal, apical, and subcostal acoustic windows.     Transthoracic echocardiogram.  M-mode, complete 2D, complete spectral Doppler, and color Doppler.  Birthdate:  Patient birthdate: [birth year]. Age:  Patient is  37year(s) old.  Sex:  Birth gender: male.  Body mass index:  BMI: 17.1kg/m^2.  Body surface area:    BSA: 1.40m^2.  Blood pressure: 132/82  Patient status:  Inpatient.  Study date:  Study date: 10/03/2023. Study time: 10:41 AM.  Location:  Echo laboratory. ---------------------------------------------------------------------------- Study Conclusions - Left ventricle: The cavity size is normal. Wall thickness is normal.   Systolic function is normal. The estimated ejection fraction is 55-60%.   Wall motion is normal; there are no regional wall motion abnormalities.   Left ventricular diastolic function parameters are normal. - Aortic valve: TrileafletThe leaflets are mildly thickened and mildly   calcified. There is mild to moderate regurgitation. - Aortic root: The root is mildly dilated and 3.6cm diameter. - Mitral valve: There is mild regurgitation. - Left atrium: The atrium is mildly dilated. - Right ventricle: Systolic function is normal. -  Pericardium, extracardiac: A trivial pericardial effusion is identified. ---------------------------------------------------------------------------- Cardiac Anatomy Left ventricle: - The cavity size is normal. Wall thickness is normal. Systolic function is   normal. The estimated ejection fraction is 55-60%. Wall motion is normal;   there are no regional wall motion abnormalities. - Left ventricular diastolic function parameters are normal. Aorta: Aortic root: The root is mildly dilated and 3.6cm diameter. Aortic valve: - TrileafletThe leaflets are mildly thickened and mildly calcified. Mobility   is not restricted. Velocity is within the normal range. There is no   stenosis. There is mild to moderate regurgitation. The mean systolic   gradient is 5mm Hg. The peak systolic gradient is 8mm Hg. The LVOT to   aortic valve VTI ratio is 0.76. The ratio of LVOT to aortic valve peak   velocity is 0.82. The ratio of LVOT to aortic valve mean velocity is 0.72. Mitral valve: - The valve is structurally normal. Mobility is not restricted. Inflow   velocity is within the normal range. There is no evidence for stenosis.   There is mild regurgitation. Left atrium:  The atrium is mildly dilated. Pulmonary artery: - Systolic pressure was within the normal range, in the range of 20mm Hg to   25mm Hg. Right ventricle: - The cavity size is normal. Wall thickness is normal. Systolic function is   normal. Pulmonic valve: - Velocity is within the normal range. There is no evidence for stenosis.   There is trivial regurgitation. Tricuspid valve: - The valve is structurally normal. Inflow velocity is within the normal   range. There is trivial regurgitation. Right atrium:  The atrium is normal in size. Pericardium: - A trivial pericardial effusion is identified. Systemic veins: Inferior vena cava: The IVC is normal-sized. ---------------------------------------------------------------------------- Measurements  Left ventricle            Value        Ref  EDD, LAX             (N) 5.3   cm     4.2 - 5.8  ESD, LAX             (N) 3.4   cm     2.5 - 4.0  EDD/bsa, LAX         (H) 3.3   cm/m^2 2.2 - 3.0  ESD/bsa, LAX         (N) 2.1   cm/m^2 1.3 - 2.1  FS, LAX              (N) 36    %      25 - 43  FS, LAX chord        (N) 36    %      25 - 43  IVS, ED              (N) 0.9   cm     0.6 - 1.0  ESD                  (N) 3.4   cm     2.5 - 4.0  ESD/bsa              (N) 2.1   cm/m^2 1.3 - 2.1  PW, ED               (N) 0.9   cm     0.6 - 1.0  IVS/PW, ED               1            ---------  EDV                  (N) 137   ml     62 - 150  ESV                  (N) 48    ml     21 - 61  EF                   (N) 65    %      52 - 72  EDV/bsa              (H) 84    ml/m^2 34 - 74  ESV/bsa              (N) 29    ml/m^2 11 - 31  SV, 1-p A2C              89    ml     ---------  SV/bsa, 1-p A2C          54.2  ml/m^2 ---------  SV, 1-p A4C              111   ml     ---------  SV/bsa, 1-p A4C          68    ml/m^2 ---------  E', lat ann, TDI     (N) 12.7  cm/sec >=10.0  E/e', lat ann, TDI   (N) 5            <=13  A', lat ann, TDI         7.4   cm/sec ---------  E'/a', lat ann, TDI      1.72         ---------  S', lat ann, TDI         7.6   cm/sec ---------  E', med ann, TDI     (N) 10.4  cm/sec >=7.0  E/e', med ann, TDI       7            ---------  A', med ann, TDI         7.1   cm/sec ---------  E'/a', med ann, TDI      1.47         ---------  S', med ann, TDI         7.7   cm/sec ---------  E', avg, TDI  11.6  cm/sec ---------  E/e', avg, TDI       (N) 6            <=14   LVOT                     Value        Ref  Peak vel, S              1.18  m/sec  ---------  Mean vel, S              0.72  m/sec  ---------  Peak grad, S             6     mm Hg  ---------   Right ventricle          Value        Ref  TAPSE, MM            (N) 1.8   cm     >=1.7  S' lateral           (L) 8.5   cm/sec >=9.5   RVOT                     Value        Ref  Peak v, S                 0.85  m/sec  ---------  Mean v, S                0.56  m/sec  ---------   Left atrium              Value        Ref  AP dim, ES           (N) 3.9   cm     3.0 - 4.0  AP dim index, ES     (H) 2.4   cm/m^2 1.5 - 2.3   Aortic valve             Value        Ref  Peak v, S                1.4   m/sec  ---------  Mean v, S                1     m/sec  ---------  Mean grad, S             5     mm Hg  ---------  Peak grad, S             8     mm Hg  ---------  LVOT/AV, VTI ratio       0.76         ---------  LVOT/AV, Vpeak ratio     0.82         ---------  LVOT/AV, Vmean ratio     0.72         ---------  AR peak v                4.95  m/sec  ---------  AR PHT                   587   ms     ---------  AR peak grad             98  mm Hg  ---------   Mitral valve             Value        Ref  Peak E                   0.68  m/sec  ---------  Peak A                   0.41  m/sec  ---------  Decel time               285   ms     ---------  Peak E/A ratio           1.7          ---------   Pulmonic valve           Value        Ref  Peak v, S                1.2   m/sec  ---------  Mean vel, S              0.77  m/sec  ---------   Tricuspid valve          Value        Ref  TR peak v            (N) 2.1   m/sec  <=2.8  Peak RV-RA grad, S       18    mm Hg  ---------   Aortic root              Value        Ref  Root diam            (H) 3.6   cm     2.3 - 3.3  Root diam/bsa            2.2   cm/m^2 ---------   Inferior vena cava       Value        Ref  Diam                 (N) 1.4   cm     <=2.1 Legend: (L)  and  (H)  mark values outside specified reference range. (N)  marks values inside specified reference range. Reviewed and confirmed by [facility] 2024-10-29T11:43:47     Duplex- no DVT  Assessment & Plan     Alexander [name] is a 37 y.o. male on hospital day 1.  The medical issues being addressed in today's encounter are as follows:    Principal Problem:    Cellulitis of left lower extremity  Active Problems:    Polysubstance  abuse (CMS-HCC)    HIV infection    Chronic hepatitis C without hepatic coma (CMS-HCC)    Opioid use disorder    #LLE cellulitis  #Leukopenia  : recent admission for same, improved with antibiotics but subsequently worsened outpatient.  -Continue vanc/cefepime, monitor for clinical improvement, monitor for toxicity  - follow up Blood cultures - NGTD  -Schedule Tylenol with PRN oxycodone. Can increase oxycodone dose if needed     #LE edema: 1+ bilateral. Concern for cellulitis in LLE as above, but RLE without erythema. Could be side effect of amlodipine but patient has been on this medication for a while.  -Duplex negative, TTE normal EF     #L knee pain: 2/2 mechanical fall. Has effusion on exam  but not erythematous or significantly TTP so does not appear to be consistent with septic joint. Initial X-ray with questionable tibial fracture, not demonstrated on repeat.  -Lidocaine patch  -Pain medications as above  -PT/OT ordered  -Outpatient ortho referral     #HIV: last CD4 earlier this month 295, viral load undetectable.  -Continue ART     #Esophageal candidiasis: diagnosed last admission.  -Continue fluconazole through 11/1 (documented stop date)     #Opioid use disorder: reporting active withdrawal.  -Addiction consulted, appreciate recommendations  -Started methadone 40 mg with additional PRN for elevated COWS, increase to 50 mg today  -Schedule clonidine with BP hold parameters  - Addiction to discuss residential options, Case Manager also consulted as patient is homeless     #Hyponatremia: mild, likely hypovolemic.  -Resolved after IV fluids     #Abnormal hepatic panel: has known hepatitis C with positive viral load, has chronically elevated AST>ALT, mildly elevated bilirubin. RUQ ultrasound last admission normal.   -Monitor           Medical Decision Making:  Severe exacerbation of chronic illness  Drug Tx requiring intensive monitoring (physiologic, electrolytes, renal function, drug levels, etc):  vancomycin          The HPI, ROS, physical exam, test results, and assessment & plan were reviewed and copied forward (with edits) from a note written by myself or a same-practice partner on 10/02/23. I have updated the history, physical exam, data, assessment, and plan of the note so that it reflects my evaluation and management of the patient on 10/03/2023.        Lyman Speller, MD  Division of [facility]  Department of Internal Medicine    12:03 PM, 10/03/2023

## 2023-10-03 NOTE — Progress Notes (Signed)
Physical Therapy  Initial Assessment and Discharge     Name: Alexander [name]  DOB: [birth year]  Attending Physician: Lyman Speller, MD  Admission Diagnosis: Cellulitis of lower extremity, unspecified laterality [L03.119]  Date: 10/03/2023  Room: 1610/R6045  Reviewed Pertinent hospital course: Yes    Hospital Course PT/OT: 37 y/o male presenting with worsening LLE cellulitis (admission and DC 10/14 with antibiotics) and recent mechanical fall on L knee. 10/28: L knee XR- moderate size L knee joint effusion, previously seen cortical irregularity of the lateral tibial plateau corresponds to a small marginal osteophyte in the current views, no definite fracture. 10/29: Pending LLE doppler  Relevant PMH : HIV, substance use disorder, esophageal candidiasis, HCV  Precautions: none  Activity Level: Activity as tolerated    Assist: None    Assessment  Assessment: No acute impairments  Prognosis: Good  Pt supine in bed upon arrival and agreeable to physical therapy evaluation. Pt ambulated 30' with forward flexed posture and a shuffling gait pattern. Pt is limited by pain level and currently does not present with acute physical therapy needs. Pt would benefit from follow up with OP physical therapy to address back and L knee pain. Pt denies mobility concerns following discharge and does not require continued IP physical therapy at this time.       Recommendation  Recommendation: Outpatient PT  Equipment Recommended: None         AM-PAC 6 Clicks Basic Mobility Inpatient Short Form: PT 6 Clicks Score: 20     Mobility Recommendations for Staff  Patient ability: Patient ambulates in hallway  Assist needed: with supervision    Home Living/Prior Function  Patient able to provide accurate information at this time: Yes  Assistance available: none  Type of Home: Homeless  Prior Function  ADL Assistance: Independent  IADL Assistance: Independent     Pain  Pain Score: 8   Pain Location: Back  Pain Descriptors: Aching (legs)  Pain  Intervention(s): Repositioned    Vision  Hearing/Vision/Perception  Hearing: No hearing deficits noted  Baseline Vision: Wears glasses all the time  Overall Vision/ Perception: Within Functional Limits       Cognition  Overall Cognitive Status: Within Functional Limits  Cognitive Assessment: Arousal/ Alertness;Behavior;Following Commands;Safety Judgment;Insight;Communication  Arousal/Alertness: Alert  Orientation Level: Oriented X4  Behavior: Appropriate;Cooperative  Following Commands: Follows all commands and directions without difficulty  Safety Judgment: Good awareness of safety precautions  Insight: Demonstrated intact insight into limitation and abilities to complete ADL's safely  Communication: Verbalization    Neuromuscular  Overall Sensation: Impaired  Impairments: Light touch  Light Touch: RLE partial deficits;LLE partial deficits  Additional Comments: Pt reports tingling tops of feet          Upper Extremity  UE Assessment: Strength WFL ( at least 3+/5) as observed during functional activity;ROM grossly [facility]    Lower Extremity  Lower Extremity  LE Assessment: Strength WFL (at least 3+/5) as observed during functional activity;Impaired  Impairments: Strength  LE Strength: mildly impaired LLE AROM due to swelling, PROM within normal limits          Functional Mobility  Bed Mobility   Supine to Sit: Independent  Sit to Supine: Independent  Transfers  Sit to Stand: Stand by assistance  Stand to Sit: Stand by assistance  Gait  Distance (in feet): 30'  Level of assistance: Stand By assistance  Assistive Device: None  Gait Characteristics: Increased trunk flexion;decreased cadence;L decreased step length;Shuffling;R decreased step length  Unable  to progress further due to: limited by pain level  Balance  Sitting - Static: Independent  Sitting-Dynamic: Independent   Standing-Static: Supervision  Standing-Dynamic: Stand by assistance       Gait belt used: Yes        Position after Therapy/Safety Handoff  Position  after treatment and safety handoff  Position after therapy session: Bed  Details: RN notified;Call light/ needs within reach  Alarms: Bed  Alarms Status: Activated and Interfaced with call system    Goals  Collaborated with: Patient  Patient Stated Goal: to decrease pain during mobility     Patient/Family Education  Educated patient on the role of physical therapy and importance of increased activity and fall prevention strategies, including use of call light; patient verbalized understanding. Handout(s) issued: none.    Plan  Plan  PT Frequency: One time visit--discharge from PT    The plan of care and recommendations assesses the patient's and/or caregiver's readiness, willingness, and ability to provide or support functional mobility and ADL tasks as needed upon discharge.        Time  Start Time: 0816  Stop Time: 0829  Time Calculation (min): 13 min    Charges   $PT Evaluation Mod Complex 30 Min: 1 Procedure                    Problem List  Patient Active Problem List   Diagnosis    Foreign body in cornea    Other, mixed, or unspecified nondependent drug abuse, unspecified (CMS-HCC)    Drug-induced mood disorder (CMS-HCC)    Bipolar affective disorder, depressed (CMS-HCC)    Severe bipolar I disorder, current or most recent episode depressed (CMS-HCC)    Mood disorder (CMS-HCC)    Bipolar I disorder, most recent episode depressed (CMS-HCC)    Tylenol overdose    Bipolar disorder (CMS-HCC)    Alcohol abuse    Polysubstance abuse (CMS-HCC)    Persistent mood disorder (CMS-HCC)    HIV infection    Chronic hepatitis C without hepatic coma (CMS-HCC)    Suicidal ideation    Elevated liver enzymes    Peripheral polyneuropathy    Hematospermia    Alcohol withdrawal (CMS-HCC)    Cellulitis    Opioid use disorder    Cough with hemoptysis    Transaminitis    Cellulitis of left lower extremity    Angular cheilitis    Odynophagia      Past Medical History  Past Medical History:   Diagnosis Date    Anxiety     Bipolar  disorder (CMS-HCC)     Bronchitis     Depression     Drug abuse and dependence (CMS-HCC)     H/O tooth extraction     Hepatitis C     HIV infection (CMS-HCC)     Liver disease Hep C    PTSD (post-traumatic stress disorder)     Substance induced mood disorder (CMS-HCC)     Thyroid disease       Past Surgical History  Past Surgical History:   Procedure Laterality Date    ESOPHAGOGASTRODUODENOSCOPY N/A 09/15/2023    Procedure: EGD WITH BIOPSY;  Surgeon: Royetta Asal, MD;  Location: [facility];  Service: Gastroenterology;  Laterality: N/A;    MANDIBLE SURGERY      OPEN REDUCTION INTERNAL FIXATION MANDIBLE Bilateral 01/27/2021    Procedure: OPEN REDUCTION INTERNAL FIXATION MANDIBLE, MAXILLOMANDIBULAR FIXATION;  Surgeon: Ellery Plunk, MD;  Location: [facility] OR;  Service: ENT;  Laterality: Bilateral;

## 2023-10-03 NOTE — Telephone Encounter (Signed)
Pt cancelled his HDF appointment on 10-04-23 W/Dr. Tasia Catchings due to hospitalization.

## 2023-10-03 NOTE — Other (Signed)
 GENERAL INFORMATION  ADDICTION SCIENCES DIVISION (ASD) CLINICAL SERVICES  Medical Office Building - [facility]  [address] [place], [place] [address]  Contact number: [phone]    Overview:  We provide comprehensive, evidence-based, outpatient substance use disorder treatment to adults (?35 years of age).     To establish care:  Most outpatients can be seen on a same-day basis through the open access assessment model. No appointment is necessary.    Opioid Treatment Program (Methadone Clinic) Open Access:  Monday - Thursday --must arrive by 9AM to [address] at [address] [facility]  Patients must have a valid photo ID.  3 patients accepted per day (EXCEPT FRIDAYS).  Patients will be asked to complete an observed drug test during the assessment.   Assessment could take up to 3-4 hours.  After completing the full assessment, patients will receive methadone that day if they are accepted to the program.  Insurances that are currently accepted: [address] Medicaid, [place], Public affairs consultant.  Cost is $20 per day without insurance      All Other Addiction Services (Suboxone, Outpatient, and Intensive Outpatient) Open Access:  Monday - Friday (except holidays) --must arrive by 9AM to [address] at [address] [facility]  Patients will be asked to complete an observed drug test prior to the assessment.  Assessment could take up to 3-4 hours.  Patient may or may not receive any medications on the day of assessment due to the patient's individual situation and provider availability, but will be scheduled to meet with a prescriber for medication management as soon as possible (typically less than 3 business days).  Most insurances are accepted. Call ahead to confirm your particular plan.      Pregnant patients who want coordinated prenatal and addiction treatment (Perinatal Addictions Program) should call [phone] to schedule an appointment.

## 2023-10-04 ENCOUNTER — Encounter

## 2023-10-04 LAB — RENAL FUNCTION PANEL W/EGFR
Albumin: 2.5 g/dL — ABNORMAL LOW (ref 3.5–5.7)
Anion Gap: 6 mmol/L (ref 3–16)
BUN: 8 mg/dL (ref 7–25)
CO2: 25 mmol/L (ref 21–33)
Calcium: 8.2 mg/dL — ABNORMAL LOW (ref 8.6–10.3)
Chloride: 105 mmol/L (ref 98–110)
Creatinine: 0.57 mg/dL — ABNORMAL LOW (ref 0.60–1.30)
EGFR: >90
Glucose: 115 mg/dL — ABNORMAL HIGH (ref 70–100)
Osmolality, Calculated: 281 mosm/kg (ref 278–305)
Phosphorus: 4.2 mg/dL (ref 2.1–4.7)
Potassium: 4 mmol/L (ref 3.5–5.3)
Sodium: 136 mmol/L (ref 133–146)

## 2023-10-04 LAB — CBC
Hematocrit: 30.9 % — ABNORMAL LOW (ref 38.5–50.0)
Hemoglobin: 10.4 g/dL — ABNORMAL LOW (ref 13.2–17.1)
MCH: 27.2 pg (ref 27.0–33.0)
MCHC: 33.6 g/dL (ref 32.0–36.0)
MCV: 81 fL (ref 80.0–100.0)
MPV: 8.6 fL (ref 7.5–11.5)
Platelets: 65 10*3/uL — ABNORMAL LOW (ref 140–400)
RBC: 3.81 10*6/uL — ABNORMAL LOW (ref 4.20–5.80)
RDW: 15.6 % — ABNORMAL HIGH (ref 11.0–15.0)
WBC: 2.9 10*3/uL — ABNORMAL LOW (ref 3.8–10.8)

## 2023-10-04 LAB — MAGNESIUM: Magnesium: 1.6 mg/dL (ref 1.5–2.5)

## 2023-10-04 LAB — C-REACTIVE PROTEIN: CRP: 4.6 mg/L (ref 1.0–10.0)

## 2023-10-04 LAB — SED RATE: Sed Rate: 57 mm/h — ABNORMAL HIGH (ref 0–15)

## 2023-10-04 MED ORDER — sulfamethoxazole-trimethoprim (BACTRIM DS) 800-160 mg per tablet
800-160 | ORAL_TABLET | Freq: Two times a day (BID) | ORAL | 0 refills | Status: AC
Start: 2023-10-04 — End: 2023-10-07
  Filled 2023-10-04: qty 12, 3d supply, fill #0

## 2023-10-04 MED ORDER — cephALEXin (KEFLEX) 500 MG capsule
500 | ORAL_CAPSULE | Freq: Four times a day (QID) | ORAL | 0 refills | Status: AC
Start: 2023-10-04 — End: 2023-10-07
  Filled 2023-10-04: qty 12, 3d supply, fill #0

## 2023-10-04 MED FILL — HYDROXYZINE HCL 25 MG TABLET: 25 25 MG | ORAL | Qty: 1

## 2023-10-04 MED FILL — CLONIDINE HCL 0.2 MG TABLET: 0.2 0.2 MG | ORAL | Qty: 1

## 2023-10-04 MED FILL — AMLODIPINE 5 MG TABLET: 5 5 MG | ORAL | Qty: 1

## 2023-10-04 MED FILL — ENOXAPARIN 40 MG/0.4 ML SUBCUTANEOUS SYRINGE: 40 40 mg/0.4 mL | SUBCUTANEOUS | Qty: 0.4

## 2023-10-04 MED FILL — VANCOMYCIN 1,000 MG INTRAVENOUS INJECTION: 1000 1000 mg | INTRAVENOUS | Qty: 1000

## 2023-10-04 MED FILL — OXYCODONE 15 MG TABLET: 15 15 MG | ORAL | Qty: 1

## 2023-10-04 MED FILL — NICOTINE 21 MG/24 HR DAILY TRANSDERMAL PATCH: 21 21 mg/24 hr | TRANSDERMAL | Qty: 1

## 2023-10-04 MED FILL — CEFEPIME 2 GRAM SOLUTION FOR INJECTION: 2 2 gram | INTRAMUSCULAR | Qty: 1

## 2023-10-04 MED FILL — TYLENOL 325 MG TABLET: 325 325 mg | ORAL | Qty: 2

## 2023-10-04 MED FILL — NICOTINE (POLACRILEX) 2 MG GUM: 2 2 mg | BUCCAL | Qty: 1

## 2023-10-04 MED FILL — NICOTINE (POLACRILEX) 2 MG GUM: 2 2 mg | BUCCAL | Qty: 2

## 2023-10-04 MED FILL — TRIUMEQ 600 MG-50 MG-300 MG TABLET: 600-50-300 600-50-300 mg | ORAL | Qty: 1

## 2023-10-04 MED FILL — LIDODERM 5 % TOPICAL PATCH: 5 5 % | TOPICAL | Qty: 1

## 2023-10-04 MED FILL — METHADONE 10 MG/ML ORAL CONCENTRATE: 10 10 mg/mL | ORAL | Qty: 10

## 2023-10-04 MED FILL — PANTOPRAZOLE 40 MG TABLET,DELAYED RELEASE: 40 40 MG | ORAL | Qty: 1

## 2023-10-04 NOTE — Discharge Instructions (Addendum)
Methadone Clinic instructions:  Monday - Thursday --must arrive by 9AM to [address] at [address] [facility]  Patients must have a valid photo ID.  3 patients accepted per day (EXCEPT FRIDAYS).  Patients will be asked to complete an observed drug test during the assessment.   Assessment could take up to 3-4 hours.  After completing the full assessment, patients will receive methadone that day if they are accepted to the program.  Insurances that are currently accepted: [address] Medicaid, [place], Public affairs consultant.  Cost is $20 per day without insurance  Future Appointments   Date Time Provider Department Center   10/18/2023  9:30 AM Mohamed Abdelrahman Arliss Journey, MD [facility]     Follow up with ID clinic as above

## 2023-10-04 NOTE — Other (Signed)
[place]    Case Manager/Social Worker Discharge Summary     Patient name: Alexander [name]                                        Patient MRN: [id number]  DOB: Jul 14, 1986                              Age: 37 y.o.              Gender: male  Patient emergency contact: Extended Emergency Contact Information  Primary Emergency Contact: Little,Bernell  Mobile Phone: [phone]  Relation: Friend  Secondary Emergency Contact: Sueanne Margarita  Address: No address given   [place]  Home Phone: [phone]  Mobile Phone: [phone]  Relation: Sister  Preferred language: English  Interpreter needed? No  Mother: Sandford, Badgley  Address: no address given   [place]  Home Phone: [phone]      Attending provider: Lyman Speller, MD  Primary care physician: Herbert Pun, MD    The MD has indicated that the patient is ready for discharge. No further SW needs.       Transfer Mode/Level of Care: [employer]      The plan has been reviewed:     Patient/Family Informed of Discharge Plan: Yes    Plan Reviewed With Patient, Family, or Significant Other: Yes    Patient and or family are aware and in agreement with the discharge plan: Yes             Plan reviewed with MD and other members of the health care team: Yes  Care Plan Completed: Yes    SW was informed [facility] no [facility], due to wanting to stay with his dog. SW contacted [facility] [phone]) and spoke with Rodell Perna to inform her they no longer needed to review referral.     SW provided patient with 2 bus passes to get to where he is going at discharge. SW provided patient with a sweatshirt per clothing request, patient has been staying in the woods. Patient provided SW with consent to print his photo off Epic including his name and DOB, to use in place of ID for UC methadone intake. SW provided copy of this to patient. SW verified with Dr. Johny Drilling that this would work in place of ID.      SW informed patient of outpatient PT referral placed, if he chooses to do outpatient PT      No further CM/SW needs.    This plan has been reviewed with the multi-disciplinary team.     Treatment Preferences    Treatment Preferences: Other (provide comment) (N/A)    Post-Discharge Goals    Patient's Post-Discharge goals: Discharge from hospital safely    Post Acute Care Provider Information:           Advertising copywriter at Discharge  Community Services at Home post discharge: Not Applicable       Graylon Gunning LISW  [phone]

## 2023-10-04 NOTE — Progress Notes (Signed)
Patient given AVS and discharge instruction along with his medication from the discharge pharmacy. Patient verbalize understanding of his discharge instructions. PIV removed as ordered. All personal belongings sent with patient. Transport order to take patient off the unit.

## 2023-10-04 NOTE — Plan of Care (Signed)
Problem: Discharge Planning  Goal: Identify discharge needs  Outcome: Progressing  Goal: Patient's discharge needs are met  Description: Collaborate with interdisciplinary team and initiate plans and interventions as needed.   Outcome: Progressing  Goal: Discharge to appropriate level of care  Outcome: Progressing     Problem: Chronic Pain  Description: Patient's pain progressing toward patient's stated pain goal  Goal: Pt will have limited adverse effects related to chronic pain  Description: i.e. Depression, opioid induced constipation, respiratory depression  Outcome: Progressing  Goal: Patient will manage pain with the appropriate technique/intervention  Description: Assess and monitor patient's pain using appropriate pain scale. Collaborate with interdisciplinary team and initiate plan and interventions as ordered.  Re-assess patient's pain level 30-60 minutes after pain management intervention.  Outcome: Progressing

## 2023-10-04 NOTE — Progress Notes (Signed)
The following clinical information related to substance use is CONFIDENTIAL and protected by [employer]. ACCESS TO THIS INFORMATION IS ON A NEED-TO-KNOW BASIS ONLY AND IS PROVIDED FOR THE PURPOSE OF ASSURING APPROPRIATE MEDICAL CARE. Federal regulations (42 CFR, Part 2) prohibit the release of this information without specific written consent of the patient. A general authorization for the release of medical information is NOT sufficient for the purpose of releasing the following information.]          ADDICTION CONSULT, FOLLOW-UP PROGRESS NOTE     Alexander [name] [id number]     Date/time of admission: 10/01/2023 11:37 PM Length of Stay: 2  Time spent in patient care (including face to face time, Chart review, Documentation and care coordination) : 35 minutes    Subjective:  Patient reports that he still has opioid cravings that increase as the day goes on. He reports that this hospital stay is different and that he could really wants to stop fentanyl for his health. He has been seeing folks older than him with more mobility and he does not want to continue. He reports he stopped alcohol his last hospital admission and his mind is made up this hospital admission to stop fentanyl. He states he will find a way to take the bus to clinic. His worry is that if they have too many patients they may nota take him.     He looks forward to being in treatment and being able to visit his mom and sister in [place]. He has not been able to see his mom in 8 years because it is too hard to travel with opioid withdrawal.    OBJECTIVE:  Vitals:  Marland Kitchen  Vitals:    10/04/23 0050 10/04/23 0325 10/04/23 0757 10/04/23 1200   BP: 113/73 125/76 131/82 107/68   BP Location:  Right upper arm Right upper arm Right upper arm   Patient Position:  Lying Lying Lying   Pulse:  61 61 59   Resp:  18 18 18    Temp:  97.6 F (36.4 C) 98.1 F (36.7 C) 98.5 F (36.9 C)   TempSrc:  Oral Oral Oral   SpO2:  100% 99% 97%   Weight:       Height:          General Appearance: pleasant, engaged, NAD  HEENT: clear sclera, EOMI, clear speech  Thorax: Normal respiratory effort, talks in full sentences  Abdomen: Non-distended  Extremitites: No edema noted  Musculoskeletal: Moves all extremities spontaneously  Psych: linear thoughts, pleasant  Neuro: Alert      Assessment:  Pt will connect to [facility]-OTP tomorrow, biggest barrier is transportation to clinic, appreciate SW assistance in bus pas to clinic for tomorrow      #Severe OUD  #Alcohol Use Disorder    RECS:  -c/w methadone 60mg  daily on discharge  -c/w acamprosate 666mg  TID on discharge (significant decrease in alcohol use)  -IN narcan on discharge  -pt to connect to [facility]-OT[facility] tomorrow  -on day of discharge, make sure med rec has most updated methadone dose, day of last dose in comments, and that it is NOT prescribed (click no print). If this is not on med rec, the methadone clinic will not be able to provide the patient an accurate dose placing them at risk of opioid withdrawal.         Do not hesitate to reach out Addiction medicine consult team, Please use Q-Genda for up to date call schedule and appropriate  physician to contact,     Len Blalock, MD MHS  Addiction Medicine Internal Medicine  Behavioral and Addiction Sciences Department   [facility]  Pager: [phone]      Services rendered with resident Dr. Ellison Hughs, Med/Peds

## 2023-10-04 NOTE — Discharge Summary (Signed)
[facility]  Department of Internal Medicine  Inpatient Discharge Summary    Patient: Alexander [name]   MRN: [id number]   CSN: [id number]    Date of Admission: 10/01/2023  Date of Discharge: 10/04/2023  Attending Physician: Lyman Speller, MD       Past Medical History     Past Medical History:   Diagnosis Date    Anxiety     Bipolar disorder (CMS-HCC)     Bronchitis     Depression     Drug abuse and dependence (CMS-HCC)     H/O tooth extraction     Hepatitis C     HIV infection (CMS-HCC)     Liver disease Hep C    PTSD (post-traumatic stress disorder)     Substance induced mood disorder (CMS-HCC)     Thyroid disease           Discharge Diagnoses     Active Hospital Problems    Diagnosis Date Noted    Cellulitis of left lower extremity [L03.116] 09/14/2023    Opioid use disorder [F11.90] 04/12/2020    Chronic hepatitis C without hepatic coma (CMS-HCC) [B18.2] 05/17/2017    HIV infection [B20] 03/07/2017    Polysubstance abuse (CMS-HCC) [F19.10] 12/11/2013      Resolved Hospital Problems   No resolved problems to display.         Operations/Procedures Performed (include dates)     Surgeries:        Lines/Drains/Airways:  Patient Lines/Drains/Airways Status       Active Line / PIV Line       Name Placement date Placement time Site Days    Peripheral IV 10/02/23 Anterior;Right Upper arm 10/02/23  0124  Upper arm  2    Peripheral IV 10/02/23 Anterior;Proximal;Right Upper arm 10/02/23  0120  Upper arm  2    Peripheral IV 10/02/23 Anterior;Left;Proximal Forearm 10/02/23  --  Forearm  2                        Consulting Services (include reason)     Addiction- patient started on Methadone and set up with Methadone clinic      Allergies     No Known Allergies      Discharge Medications        Medication List        TAKE these medications, which are NEW        Quantity/Refills   cephALEXin 500 MG capsule  Commonly known as: KEFLEX  Take 1 capsule (500 mg total) by mouth 4 times daily before meals  and at bedtime for 3 days.   Quantity: 12 capsule  Refills: 0     sulfamethoxazole-trimethoprim 800-160 mg per tablet  Commonly known as: BACTRIM DS  Take 2 tablets by mouth 2 times a day for 3 days.   Quantity: 12 tablet  Refills: 0            TAKE these medications, which you were ALREADY TAKING        Quantity/Refills   acamprosate 333 mg tablet  Commonly known as: CAMPRAL  Take 2 tablets (666 mg total) by mouth 3 times a day.   Quantity: 180 tablet  Refills: 0     albuterol 90 mcg/actuation inhaler  Commonly known as: PROVENTIL-PROAIR-VENTOLIN  Inhale 1 puff into the lungs every 6 hours as needed.   Quantity: 18 g  Refills: 0     amLODIPine  5 MG tablet  Commonly known as: NORVASC  Take 1 tablet (5 mg total) by mouth daily.   Quantity: 30 tablet  Refills: 0     fluconazole 100 MG tablet  Commonly known as: DIFLUCAN  Take 1 tablet (100 mg total) by mouth daily for 17 days.   Quantity: 17 tablet  Refills: 0     gabapentin 300 MG capsule  Commonly known as: NEURONTIN  Take 1 capsule (300 mg total) by mouth 3 times a day.   Quantity: 90 capsule  Refills: 1     methocarbamoL 500 MG tablet  Commonly known as: ROBAXIN  Take 1 tablet (500 mg total) by mouth 4 times daily before meals and at bedtime.   Quantity: 120 tablet  Refills: 1     naloxone 4 mg/actuation Spry  Commonly known as: NARCAN  Apply 1 spray in one nostril if needed. Call 911. May repeat dose in other nostril if no response in 3 minutes.   Quantity: 2 each  Refills: 0     nicotine polacrilex 4 MG gum  Commonly known as: NICORETTE  Place 1 each (4 mg total) inside cheek every hour as needed for nicotine cravings.   Quantity: 110 each  Refills: 0     nystatin 100,000 unit/mL suspension  Commonly known as: MYCOSTATIN  Swish and swallow 5 mLs (500,000 Units total) 4 times a day.   Quantity: 473 mL  Refills: 0     pantoprazole 40 MG tablet  Commonly known as: PROTONIX  Take 1 tablet (40 mg total) by mouth every morning before breakfast.   Quantity: 30  tablet  Refills: 0     Triumeq 600-50-300 mg Tab  Generic drug: abacavir-dolutegravir-lamivud  Take 1 tablet by mouth daily.   Quantity: 30 tablet  Refills: 2               Where to Get Your Medications        These medications were sent to [facility]  [address] [place], [place] [address]      Hours: Sunday - Saturday: 8:00AM - 6:00PM Phone: [phone]   cephALEXin 500 MG capsule  sulfamethoxazole-trimethoprim 800-160 mg per tablet           Reason for Admission     Alexander [name] is a 37 y.o. male with PMH of opioid use disorder, HIV admitted for cellulitis.       Hospital Course By Problem     #LLE cellulitis  #Leukopenia  : recent admission for same, improved with antibiotics but subsequently worsened outpatient.  -Received vanc/cefepime for two days with  improvement, Will complete 5 day course with Bactrim/Kelfex outpatient     #LE edema: 1+ bilateral. Concern for cellulitis in LLE as above, but RLE without erythema. Could be side effect of amlodipine but patient has been on this medication for a while.  -Duplex negative, TTE normal EF     #L knee pain: 2/2 mechanical fall. Has effusion on exam but not erythematous or significantly TTP so does not appear to be consistent with septic joint. Initial X-ray with questionable tibial fracture, not demonstrated on repeat.  -Improved with rest and elevation. Outpatient PT ordered     #HIV: last CD4 earlier this month 295, viral load undetectable.  -Continued ART     #Esophageal candidiasis: diagnosed last admission.  -Continued fluconazole and will stop outpatient 11/1 (documented stop date)     #Opioid use disorder: reporting active withdrawal.  -  Addiction consulted, patient started on Methadone, will follow up in Methadone clinic day after discharge for intake     #Hyponatremia: mild, likely hypovolemic.  -Resolved after IV fluids     #Abnormal hepatic panel: has known hepatitis C with positive viral load, has chronically elevated  AST>ALT, mildly elevated bilirubin. RUQ ultrasound last admission normal.     #Thrombocytopenia - chronic, stable    #Underweight- likely due to homelessness, drug use    #Homelessness- offered Respite care, but patient refused, takes care of his dogs in the woods.       Condition on Discharge     1. Functional Status: Normal    2. Mental Status: Normal    3. Diet / Tube Feeding / TPN:  Diet/Nutrition Orders    Diet Regular(7)     Frequency: Effective Now     Number of Occurrences: Until Specified     Order Questions:      Suicide/Behavior Risk Modification? No     As listed above    4. Respiratory / Lines & Tubes / Wounds:  None required    5. Discharge Physical Exam:    Vital Signs     Temp:  [97.6 F (36.4 C)-98.5 F (36.9 C)] 98.5 F (36.9 C)  Heart Rate:  [59-65] 59  Resp:  [14-18] 18  BP: (107-131)/(68-86) 107/68      Physical Exam     Gen - alert, no distress, poor dentition  CV - RRR, no MRG, no elevated JVP  Lung - CTA, normal WOB  Abd - +BS, soft nt/nd, no organomegaly  MSK - Left knee swelling but much improved, erythema on shin area improving., has old scab over left shin  Skin - normal temp, no rashes  Neuro - alert, oriented to self/place/time/situation, no facial asymmetry, no gross focal weakness, sensation grossly intact to light touch  Psych - normal mood, normal behavior      Disposition     Home independent      Follow-Up Appointments     Future Appointments   Date Time Provider Department Center   10/18/2023  9:30 AM Mohamed Claudie Revering Arliss Journey, MD [facility]       No follow-up provider specified.        Patient Instructions / Follow-Up Items for Receiving Physician     Follow up in ID Clinic  Outpatient PT ordered      Medical Decision Making:  Discharge >30 minutes, 99239: Total time personally spent today, including face-to-face with patient and/or caregiver(s), as well as non-face-to-face time spent in care coordination, consultation, reviewing records, and documentation was 35  minutes.     Signed:    Lyman Speller, MD   Division of [facility] Medicine  Department of Internal Medicine  10/04/2023, 12:29 PM

## 2023-10-18 ENCOUNTER — Ambulatory Visit: Admit: 2023-10-18 | Payer: PRIVATE HEALTH INSURANCE

## 2023-10-18 ENCOUNTER — Ambulatory Visit: Admit: 2023-10-18 | Discharge: 2023-10-18 | Payer: PRIVATE HEALTH INSURANCE

## 2023-10-18 DIAGNOSIS — L03116 Cellulitis of left lower limb: Secondary | ICD-10-CM

## 2023-10-18 DIAGNOSIS — B20 Human immunodeficiency virus [HIV] disease: Secondary | ICD-10-CM

## 2023-10-18 LAB — NASH FIBROSURE(R) PLUS
ALT (SGPT) P5P: 58 [IU]/L — ABNORMAL HIGH (ref 0–55)
AST (SGOT) P5P: 127 [IU]/L — ABNORMAL HIGH (ref 0–40)
Alpha 2-Macroglobulins, Qn: 272 mg/dL (ref 110–276)
Apolipoprotein A-1: 64 mg/dL — ABNORMAL LOW (ref 101–178)
Bilirubin, Total: 0.7 mg/dL (ref 0.0–1.2)
Cholesterol, Total: 66 mg/dL — ABNORMAL LOW (ref 100–199)
GGT: 117 [IU]/L — ABNORMAL HIGH (ref 0–65)
Glucose, Serum: 99 mg/dL (ref 70–99)
Haptoglobin: 101 mg/dL (ref 17–317)
Triglycerides: 47 mg/dL (ref 0–149)

## 2023-10-18 LAB — COMPREHENSIVE METABOLIC PANEL, SERUM
ALT: 50 U/L (ref 7–52)
AST (SGOT): 114 U/L — ABNORMAL HIGH (ref 13–39)
Albumin: 2.9 g/dL — ABNORMAL LOW (ref 3.5–5.7)
Alkaline Phosphatase: 145 U/L — ABNORMAL HIGH (ref 36–125)
Anion Gap: 8 mmol/L (ref 3–16)
BUN: 7 mg/dL (ref 7–25)
CO2: 27 mmol/L (ref 21–33)
Calcium: 8.1 mg/dL — ABNORMAL LOW (ref 8.6–10.3)
Chloride: 101 mmol/L (ref 98–110)
Creatinine: 0.46 mg/dL — ABNORMAL LOW (ref 0.60–1.30)
EGFR: >90
Glucose: 106 mg/dL — ABNORMAL HIGH (ref 70–100)
Osmolality, Calculated: 280 mosm/kg (ref 278–305)
Potassium: 4.1 mmol/L (ref 3.5–5.3)
Sodium: 136 mmol/L (ref 133–146)
Total Bilirubin: 0.9 mg/dL (ref 0.0–1.5)
Total Protein: 7.6 g/dL (ref 6.4–8.9)

## 2023-10-18 LAB — LYMPHOCYTE SUBSET FOLLOWUP
% CD 3 Pos. Lymph.: 87.3 % (ref 56.0–89.0)
% CD 4 Pos. Lymph.: 50.4 % (ref 30.0–64.0)
% CD 8 Pos. Lymph.: 34.3 % (ref 6.0–43.0)
Absolute CD 3: 546 {cells}/uL — ABNORMAL LOW (ref 870.0–2532.0)
Absolute CD 4 Helper: 308 {cells}/uL (ref 255.0–2496.0)
Absolute CD 8 (Supp): 210 {cells}/uL (ref 155.0–1010.0)
CD19 % B Cell: 6.2 % (ref 1.0–28.0)
CD19 Abs: 40 {cells}/uL (ref 24.0–683.0)
CD4/CD8 Ratio: 1.47 {ratio} (ref 1.10–3.70)

## 2023-10-18 LAB — DIFFERENTIAL
Basophils Absolute: 40 /uL (ref 0–200)
Basophils Relative: 0.9 % (ref 0.0–1.0)
Eosinophils Absolute: 84 /uL (ref 15–500)
Eosinophils Relative: 1.9 % (ref 0.0–8.0)
Lymphocytes Absolute: 647 /uL — ABNORMAL LOW (ref 850–3900)
Lymphocytes Relative: 14.7 % — ABNORMAL LOW (ref 15.0–45.0)
Monocytes Absolute: 440 /uL (ref 200–950)
Monocytes Relative: 10 % (ref 0.0–12.0)
Neutrophils Absolute: 3190 /uL (ref 1500–7800)
Neutrophils Relative: 72.5 % (ref 40.0–80.0)
nRBC: 0 /100{WBCs} (ref 0–0)

## 2023-10-18 LAB — CBC
Hematocrit: 31 % — ABNORMAL LOW (ref 38.5–50.0)
Hemoglobin: 10.4 g/dL — ABNORMAL LOW (ref 13.2–17.1)
MCH: 26.2 pg — ABNORMAL LOW (ref 27.0–33.0)
MCHC: 33.4 g/dL (ref 32.0–36.0)
MCV: 78.6 fL — ABNORMAL LOW (ref 80.0–100.0)
MPV: 9.9 fL (ref 7.5–11.5)
Platelets: 67 10*3/uL — ABNORMAL LOW (ref 140–400)
RBC: 3.95 10*6/uL — ABNORMAL LOW (ref 4.20–5.80)
RDW: 15.8 % — ABNORMAL HIGH (ref 11.0–15.0)
WBC: 4.4 10*3/uL (ref 3.8–10.8)

## 2023-10-18 LAB — HIV-1 RNA, QUANTITATIVE, PCR: HIV 1 Copies: NOT DETECTED {copies}/mL

## 2023-10-18 MED ORDER — triamcinolone (KENALOG) 0.1 % ointment
0.1 | Freq: Two times a day (BID) | TOPICAL | 0 refills | 1.00000 days | Status: AC
Start: 2023-10-18 — End: ?

## 2023-10-18 MED ORDER — AMOXicillin-clavulanate (AUGMENTIN) 875-125 mg per tablet
875-125 | ORAL_TABLET | Freq: Two times a day (BID) | ORAL | 0 refills | Status: AC
Start: 2023-10-18 — End: 2023-10-25

## 2023-10-18 MED ORDER — abacavir-dolutegravir-lamivud (TRIUMEQ) 600-50-300 mg Tab
600-50-300 | ORAL_TABLET | Freq: Every day | ORAL | 2 refills | Status: AC
Start: 2023-10-18 — End: ?

## 2023-10-18 NOTE — Progress Notes (Signed)
Patient ID: Alexander [name] is a 37 y.o. male.    Chief Complaint   Patient presents with    HIV Comprehensive Visit (Follow up)        History of Present Illness  Alexander [name] is a 37 y.o. male with a history of HIV diagnosed with HIV on 02/05/17 on Triumeq, HCV 2014 not received treatment, recent Hx of admission for left leg cellulitis. Patient reported that he is homeless and living in a tent but is applying for housing. Stated that his HIV medication was finished and he did not take his HIV medication for 5 days. Patient has oral thrush and is using nystatin swish and swallow and he report that it feels a lot better. Patient denied fever, SOB, cough, runny nose, sore throat, abdominal pain, N/V, diarrhea, urinary symptoms.    Histories  He has a past medical history of Anxiety, Bipolar disorder (CMS-HCC), Bronchitis, Depression, Drug abuse and dependence (CMS-HCC), H/O tooth extraction, Hepatitis C, HIV infection (CMS-HCC), Liver disease (Hep C), PTSD (post-traumatic stress disorder), Substance induced mood disorder (CMS-HCC), and Thyroid disease.    He has a past surgical history that includes Mandible surgery; open reduction internal fixation mandible (Bilateral, 01/27/2021); and Esophagogastroduodenoscopy (N/A, 09/15/2023).    His family history includes Breast Cancer in his father; Cancer in his father; Drug abuse in his brother and brother; Hypertension in his father and mother; Seizures in his father; Suicidality in his brother, brother, and father; Transient ischemic attack in his mother.    He reports that he has been smoking cigarettes. He has a 11 pack-year smoking history. He has never used smokeless tobacco. He reports current alcohol use. He reports current drug use. Drugs: Heroin, Other-see comments, Marijuana, and Cocaine.    Allergies  Patient has no known drug allergies or adverse reactions.    Medications  Outpatient Encounter Medications as of 10/18/2023   Medication Sig Dispense Refill     abacavir-dolutegravir-lamivud (TRIUMEQ) 600-50-300 mg Tab Take 1 tablet by mouth daily. 30 tablet 2    acamprosate (CAMPRAL) 333 mg tablet Take 2 tablets (666 mg total) by mouth 3 times a day. 180 tablet 0    albuterol 90 mcg/actuation Inhl inhaler Inhale 1 puff into the lungs every 6 hours as needed. 18 g 0    amLODIPine (NORVASC) 5 MG tablet Take 1 tablet (5 mg total) by mouth daily. 30 tablet 0    gabapentin (NEURONTIN) 300 MG capsule Take 1 capsule (300 mg total) by mouth 3 times a day. 90 capsule 1    methocarbamoL (ROBAXIN) 500 MG tablet Take 1 tablet (500 mg total) by mouth 4 times daily before meals and at bedtime. 120 tablet 1    naloxone (NARCAN) 4 mg/actuation Spry Apply 1 spray in one nostril if needed. Call 911. May repeat dose in other nostril if no response in 3 minutes. 2 each 0    nicotine polacrilex (NICORETTE) 4 MG gum Place 1 each (4 mg total) inside cheek every hour as needed for nicotine cravings. 110 each 0    nystatin (MYCOSTATIN) 100,000 unit/mL suspension Swish and swallow 5 mLs (500,000 Units total) 4 times a day. 473 mL 0    pantoprazole (PROTONIX) 40 MG tablet Take 1 tablet (40 mg total) by mouth every morning before breakfast. 30 tablet 0    [DISCONTINUED] abacavir-dolutegravir-lamivud (TRIUMEQ) 600-50-300 mg Tab Take 1 tablet by mouth daily. 30 tablet 2    AMOXicillin-clavulanate (AUGMENTIN) 875-125 mg per tablet Take 1 tablet  by mouth 2 times a day for 7 days. 14 tablet 0    [EXPIRED] cephALEXin (KEFLEX) 500 MG capsule Take 1 capsule (500 mg total) by mouth 4 times daily before meals and at bedtime for 3 days. 12 capsule 0    [EXPIRED] fluconazole (DIFLUCAN) 100 MG tablet Take 1 tablet (100 mg total) by mouth daily for 17 days. 17 tablet 0    [EXPIRED] sulfamethoxazole-trimethoprim (BACTRIM DS) 800-160 mg per tablet Take 2 tablets by mouth 2 times a day for 3 days. 12 tablet 0    triamcinolone (KENALOG) 0.1 % ointment Apply topically 2 times a day. 30 g 0    [DISCONTINUED]  abacavir-dolutegravir-lamivud (TRIUMEQ) 600-50-300 mg tablet 1 tablet       [DISCONTINUED] acetaminophen (TYLENOL) tablet 650 mg       [DISCONTINUED] amLODIPine (NORVASC) tablet 5 mg       [DISCONTINUED] cefepime (MAXIPIME) 2 g in sodium chloride 0.9% 100 mL IVPB ADDaptor       [DISCONTINUED] cloNIDine HCL (CATAPRES) tablet 0.1 mg       [DISCONTINUED] dextrose 10%-water (D10W) IV soln       [DISCONTINUED] dextrose 10%-water (D10W) IV soln       [DISCONTINUED] enoxaparin (LOVENOX) syringe 40 mg/0.4 mL       [DISCONTINUED] fluconazole (DIFLUCAN) tablet 100 mg       [DISCONTINUED] glucose chewable tablet 12 g       [DISCONTINUED] hydrOXYzine HCL (ATARAX) tablet 25 mg       [DISCONTINUED] lidocaine (LIDODERM) 5 % 1 patch       [DISCONTINUED] methadone (DOLOPHINE) 10 mg/mL oral concentrate 60 mg       [DISCONTINUED] nicotine (NICODERM CQ) 21 mg/24 hr 1 patch       [DISCONTINUED] nicotine (polacrilex) (NICORETTE) gum 4 mg       [DISCONTINUED] oxyCODONE (ROXICODONE) immediate release tablet 15 mg       [DISCONTINUED] pantoprazole (PROTONIX) EC tablet 40 mg       [DISCONTINUED] vancomycin (VANCOCIN) 1,000 mg in sodium chloride 0.9% 250 mL ADDaptor IVPB        No facility-administered encounter medications on file as of 10/18/2023.        Review of Systems    Vitals  Blood pressure 135/88, pulse 70, temperature 97.1 F (36.2 C), temperature source Temporal, resp. rate 16, weight 136 lb (61.7 kg), SpO2 100%.    Physical Exam  Not in acute distress, not paled, cyanosed or jaundiced   No LAD, supple neck.  Chest is clear with no added sounds  Normal S1 S2 with no murmur  Soft abdomin with no tenderness or organomegaly   Left leg looks red with no swelling or pain  No focal neurological deficits or loss of sensation   Review of Lab Results  Lab Results   Component Value Date    ABSCD4HELP 295.0 09/16/2023    CD4POSLY 45.9 09/16/2023     Lab Results   Component Value Date    HIV1COP <20.0 09/14/2023     Lab Results   Component  Value Date    HIV12ABAGN Reactive (A) 02/05/2017      Lab Results   Component Value Date    HEPAIGG Reactive (A) 04/01/2020     Lab Results   Component Value Date    HEPBSAG Nonreactive 09/14/2023    HBSAB Reactive (A) 09/14/2023    HBSABNUMBER 126.00 (H) 09/14/2023    HEPBCAB Nonreactive 04/01/2020     Lab Results   Component Value  Date    HCVAB Reactive (A) 09/14/2023    IU 70,800 09/14/2023    HEPCGENO HCV Genotype 3 Detected 09/14/2023     Lab Results   Component Value Date    TREPIA Negative 09/15/2023    RPRMONSCRN Non-Reactive 11/15/2013     Lab Results   Component Value Date    [facility] Negative 02/20/2017    GCNAA Negative 02/20/2017     Lab Results   Component Value Date    QUANTIFERTB NEGATIVE 02/20/2017             Assessment/Plan  Left leg cellulitis: Augmentin was prescribed to the patient for 7 day and triamcinolone was refilled.    HIV: Triumeq refilled today.    HCV: Fibrosure was ordered today, and patient report that he want to start treatment after he moves to a house.    We will see the patient again at the [facility] in 4 weeks

## 2023-11-14 NOTE — Telephone Encounter (Signed)
-   Left VM regarding his doctor appointment on 11-15-23 at 10:30am w/Dr. Arbutus Ped.  The address is [address] [place], [place], [place] [address].  We are located in the [employer]. The [address] number is [id number].  Please arrive 15 minutes prior to your appointment. You can park in the open lot across from the entrance and we will validate your parking.  Please make sure to register on the ground floor before taking the elevators up to the first floor for your appointment.  If you have any questions, please feel free to call me at [phone]. Thank you and have a good day.

## 2023-11-15 ENCOUNTER — Ambulatory Visit: Payer: PRIVATE HEALTH INSURANCE

## 2024-01-16 NOTE — Progress Notes (Signed)
 ODA SW received phone call from [facility] requesting MRN and possible PCP if we have one. Chart review does not reveal one. Last contact with [facility] was Oct 2024. [facility] advised that he has passed on in the community.    SW thanked them for calling in today and asked them to call us  back if we could be of further assistance.     Respectfully submitted,     Logen Fowle L. Kayson Bullis-Hix MSW, Psychologist, [facility]  [phone]
